# Patient Record
Sex: Female | Born: 1935
Health system: Southern US, Community
[De-identification: ages and names within clinical notes are randomized; demographics above are authoritative.]

## PROBLEM LIST (undated history)

## (undated) DIAGNOSIS — S42309A Unspecified fracture of shaft of humerus, unspecified arm, initial encounter for closed fracture: Secondary | ICD-10-CM

## (undated) DIAGNOSIS — J449 Chronic obstructive pulmonary disease, unspecified: Secondary | ICD-10-CM

## (undated) DIAGNOSIS — E871 Hypo-osmolality and hyponatremia: Secondary | ICD-10-CM

## (undated) DIAGNOSIS — M1990 Unspecified osteoarthritis, unspecified site: Secondary | ICD-10-CM

## (undated) DIAGNOSIS — Z7901 Long term (current) use of anticoagulants: Secondary | ICD-10-CM

## (undated) DIAGNOSIS — J9600 Acute respiratory failure, unspecified whether with hypoxia or hypercapnia: Secondary | ICD-10-CM

## (undated) DIAGNOSIS — I10 Essential (primary) hypertension: Secondary | ICD-10-CM

## (undated) DIAGNOSIS — I482 Chronic atrial fibrillation, unspecified: Secondary | ICD-10-CM

## (undated) DIAGNOSIS — I509 Heart failure, unspecified: Secondary | ICD-10-CM

## (undated) DIAGNOSIS — J40 Bronchitis, not specified as acute or chronic: Secondary | ICD-10-CM

## (undated) DIAGNOSIS — I2510 Atherosclerotic heart disease of native coronary artery without angina pectoris: Secondary | ICD-10-CM

## (undated) DIAGNOSIS — IMO0001 Reserved for inherently not codable concepts without codable children: Secondary | ICD-10-CM

## (undated) DIAGNOSIS — N289 Disorder of kidney and ureter, unspecified: Secondary | ICD-10-CM

## (undated) DIAGNOSIS — I5033 Acute on chronic diastolic (congestive) heart failure: Secondary | ICD-10-CM

## (undated) DIAGNOSIS — E079 Disorder of thyroid, unspecified: Secondary | ICD-10-CM

## (undated) DIAGNOSIS — F0390 Unspecified dementia without behavioral disturbance: Secondary | ICD-10-CM

## (undated) DIAGNOSIS — C801 Malignant (primary) neoplasm, unspecified: Secondary | ICD-10-CM

## (undated) DIAGNOSIS — Z950 Presence of cardiac pacemaker: Secondary | ICD-10-CM

## (undated) DIAGNOSIS — Z5189 Encounter for other specified aftercare: Secondary | ICD-10-CM

## (undated) DIAGNOSIS — I459 Conduction disorder, unspecified: Secondary | ICD-10-CM

## (undated) DIAGNOSIS — D649 Anemia, unspecified: Secondary | ICD-10-CM

## (undated) DIAGNOSIS — R413 Other amnesia: Secondary | ICD-10-CM

## (undated) DIAGNOSIS — I340 Nonrheumatic mitral (valve) insufficiency: Secondary | ICD-10-CM

## (undated) DIAGNOSIS — E876 Hypokalemia: Secondary | ICD-10-CM

## (undated) DIAGNOSIS — Z45018 Encounter for adjustment and management of other part of cardiac pacemaker: Secondary | ICD-10-CM

## (undated) HISTORY — DX: Unspecified osteoarthritis, unspecified site: M19.90

## (undated) HISTORY — PX: REPLACEMENT TOTAL KNEE BILATERAL: SUR1225

## (undated) HISTORY — DX: Acute respiratory failure, unspecified whether with hypoxia or hypercapnia: J96.00

## (undated) HISTORY — DX: Conduction disorder, unspecified: I45.9

## (undated) HISTORY — PX: WRIST FRACTURE SURGERY: SHX121

## (undated) HISTORY — DX: Malignant (primary) neoplasm, unspecified: C80.1

## (undated) HISTORY — PX: OTHER SURGICAL HISTORY: SHX169

## (undated) HISTORY — DX: Anemia, unspecified: D64.9

## (undated) HISTORY — PX: ABOVE KNEE LEG AMPUTATION: SUR20

## (undated) HISTORY — PX: FRACTURE SURGERY: SHX138

## (undated) HISTORY — DX: Essential (primary) hypertension: I10

## (undated) HISTORY — PX: BREAST BIOPSY: SHX20

## (undated) HISTORY — PX: FEMUR FRACTURE SURGERY: SHX633

## (undated) HISTORY — PX: JOINT REPLACEMENT: SHX530

## (undated) HISTORY — DX: Encounter for other specified aftercare: Z51.89

## (undated) HISTORY — PX: BELOW KNEE LEG AMPUTATION: SUR23

## (undated) HISTORY — DX: Acute on chronic diastolic (congestive) heart failure: I50.33

## (undated) HISTORY — DX: Hypokalemia: E87.6

## (undated) HISTORY — DX: Nonrheumatic mitral (valve) insufficiency: I34.0

## (undated) HISTORY — DX: Bronchitis, not specified as acute or chronic: J40

## (undated) HISTORY — DX: Reserved for inherently not codable concepts without codable children: IMO0001

## (undated) HISTORY — PX: PACEMAKER PLACEMENT: SHX43

## (undated) HISTORY — PX: ABDOMINAL HYSTERECTOMY: SHX81

## (undated) HISTORY — PX: SHOULDER SURGERY: SHX246

## (undated) HISTORY — DX: Hypo-osmolality and hyponatremia: E87.1

## (undated) HISTORY — PX: COLONOSCOPY: SHX174

---
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
## 1998-06-26 ENCOUNTER — Other Ambulatory Visit: Admission: RE | Admit: 1998-06-26 | Discharge: 1998-06-26 | Payer: Self-pay | Admitting: Internal Medicine

## 1999-04-12 ENCOUNTER — Encounter: Payer: Self-pay | Admitting: Orthopedic Surgery

## 1999-04-19 ENCOUNTER — Encounter: Payer: Self-pay | Admitting: Orthopedic Surgery

## 1999-04-19 ENCOUNTER — Inpatient Hospital Stay (HOSPITAL_COMMUNITY): Admission: RE | Admit: 1999-04-19 | Discharge: 1999-04-23 | Payer: Self-pay | Admitting: Orthopedic Surgery

## 2000-03-03 ENCOUNTER — Emergency Department (HOSPITAL_COMMUNITY): Admission: EM | Admit: 2000-03-03 | Discharge: 2000-03-03 | Payer: Self-pay | Admitting: Emergency Medicine

## 2000-03-03 ENCOUNTER — Encounter: Payer: Self-pay | Admitting: Emergency Medicine

## 2000-03-29 ENCOUNTER — Encounter: Payer: Self-pay | Admitting: Internal Medicine

## 2000-03-29 ENCOUNTER — Encounter: Admission: RE | Admit: 2000-03-29 | Discharge: 2000-03-29 | Payer: Self-pay | Admitting: Internal Medicine

## 2001-03-01 ENCOUNTER — Encounter: Payer: Self-pay | Admitting: Orthopedic Surgery

## 2001-03-01 ENCOUNTER — Encounter: Admission: RE | Admit: 2001-03-01 | Discharge: 2001-03-01 | Payer: Self-pay | Admitting: Orthopedic Surgery

## 2001-09-03 ENCOUNTER — Other Ambulatory Visit: Admission: RE | Admit: 2001-09-03 | Discharge: 2001-09-03 | Payer: Self-pay | Admitting: Internal Medicine

## 2001-09-07 ENCOUNTER — Encounter: Payer: Self-pay | Admitting: Internal Medicine

## 2001-09-07 ENCOUNTER — Encounter: Admission: RE | Admit: 2001-09-07 | Discharge: 2001-09-07 | Payer: Self-pay | Admitting: Internal Medicine

## 2001-10-22 ENCOUNTER — Encounter: Payer: Self-pay | Admitting: Orthopedic Surgery

## 2001-10-29 ENCOUNTER — Encounter: Payer: Self-pay | Admitting: Orthopedic Surgery

## 2001-10-29 ENCOUNTER — Inpatient Hospital Stay (HOSPITAL_COMMUNITY): Admission: RE | Admit: 2001-10-29 | Discharge: 2001-11-02 | Payer: Self-pay | Admitting: Orthopedic Surgery

## 2001-10-31 ENCOUNTER — Encounter: Payer: Self-pay | Admitting: Orthopedic Surgery

## 2002-10-17 ENCOUNTER — Encounter: Payer: Self-pay | Admitting: Internal Medicine

## 2002-10-17 ENCOUNTER — Encounter: Admission: RE | Admit: 2002-10-17 | Discharge: 2002-10-17 | Payer: Self-pay | Admitting: Internal Medicine

## 2003-11-03 ENCOUNTER — Ambulatory Visit (HOSPITAL_COMMUNITY): Admission: RE | Admit: 2003-11-03 | Discharge: 2003-11-03 | Payer: Self-pay | Admitting: *Deleted

## 2003-11-25 ENCOUNTER — Encounter: Admission: RE | Admit: 2003-11-25 | Discharge: 2003-11-25 | Payer: Self-pay | Admitting: Internal Medicine

## 2003-12-03 ENCOUNTER — Encounter: Admission: RE | Admit: 2003-12-03 | Discharge: 2003-12-03 | Payer: Self-pay | Admitting: Internal Medicine

## 2004-02-15 ENCOUNTER — Encounter: Admission: RE | Admit: 2004-02-15 | Discharge: 2004-02-15 | Payer: Self-pay | Admitting: Orthopedic Surgery

## 2004-06-15 ENCOUNTER — Ambulatory Visit: Admission: RE | Admit: 2004-06-15 | Discharge: 2004-06-15 | Payer: Self-pay | Admitting: Internal Medicine

## 2005-01-04 ENCOUNTER — Encounter: Admission: RE | Admit: 2005-01-04 | Discharge: 2005-01-04 | Payer: Self-pay | Admitting: Internal Medicine

## 2005-06-21 DIAGNOSIS — Z950 Presence of cardiac pacemaker: Secondary | ICD-10-CM | POA: Insufficient documentation

## 2005-06-21 HISTORY — DX: Presence of cardiac pacemaker: Z95.0

## 2005-12-05 ENCOUNTER — Ambulatory Visit: Admission: RE | Admit: 2005-12-05 | Discharge: 2005-12-05 | Payer: Self-pay | Admitting: Internal Medicine

## 2006-01-11 ENCOUNTER — Encounter: Admission: RE | Admit: 2006-01-11 | Discharge: 2006-01-11 | Payer: Self-pay | Admitting: Internal Medicine

## 2006-01-17 ENCOUNTER — Encounter: Admission: RE | Admit: 2006-01-17 | Discharge: 2006-01-17 | Payer: Self-pay | Admitting: Internal Medicine

## 2006-03-09 ENCOUNTER — Encounter: Admission: RE | Admit: 2006-03-09 | Discharge: 2006-03-09 | Payer: Self-pay | Admitting: Orthopedic Surgery

## 2006-05-04 ENCOUNTER — Encounter: Payer: Self-pay | Admitting: Internal Medicine

## 2006-09-18 ENCOUNTER — Encounter (INDEPENDENT_AMBULATORY_CARE_PROVIDER_SITE_OTHER): Payer: Self-pay | Admitting: *Deleted

## 2006-09-18 ENCOUNTER — Encounter: Admission: RE | Admit: 2006-09-18 | Discharge: 2006-09-18 | Payer: Self-pay | Admitting: Internal Medicine

## 2006-10-09 ENCOUNTER — Encounter: Admission: RE | Admit: 2006-10-09 | Discharge: 2006-10-09 | Payer: Self-pay | Admitting: Internal Medicine

## 2006-10-09 ENCOUNTER — Encounter (INDEPENDENT_AMBULATORY_CARE_PROVIDER_SITE_OTHER): Payer: Self-pay | Admitting: *Deleted

## 2007-01-15 ENCOUNTER — Encounter: Admission: RE | Admit: 2007-01-15 | Discharge: 2007-01-15 | Payer: Self-pay | Admitting: Internal Medicine

## 2007-03-28 ENCOUNTER — Emergency Department (HOSPITAL_COMMUNITY): Admission: EM | Admit: 2007-03-28 | Discharge: 2007-03-28 | Payer: Self-pay | Admitting: Emergency Medicine

## 2007-04-04 ENCOUNTER — Encounter: Payer: Self-pay | Admitting: Internal Medicine

## 2007-04-06 ENCOUNTER — Inpatient Hospital Stay (HOSPITAL_COMMUNITY): Admission: RE | Admit: 2007-04-06 | Discharge: 2007-04-16 | Payer: Self-pay | Admitting: Orthopedic Surgery

## 2007-06-04 ENCOUNTER — Inpatient Hospital Stay (HOSPITAL_COMMUNITY): Admission: RE | Admit: 2007-06-04 | Discharge: 2007-06-11 | Payer: Self-pay | Admitting: Orthopedic Surgery

## 2007-07-13 ENCOUNTER — Encounter (INDEPENDENT_AMBULATORY_CARE_PROVIDER_SITE_OTHER): Payer: Self-pay | Admitting: Orthopedic Surgery

## 2007-07-13 ENCOUNTER — Inpatient Hospital Stay (HOSPITAL_COMMUNITY): Admission: RE | Admit: 2007-07-13 | Discharge: 2007-07-17 | Payer: Self-pay | Admitting: Orthopedic Surgery

## 2007-10-30 ENCOUNTER — Encounter: Payer: Self-pay | Admitting: Internal Medicine

## 2008-01-12 ENCOUNTER — Encounter: Admission: RE | Admit: 2008-01-12 | Discharge: 2008-01-12 | Payer: Self-pay | Admitting: Orthopedic Surgery

## 2008-01-18 ENCOUNTER — Encounter: Admission: RE | Admit: 2008-01-18 | Discharge: 2008-01-18 | Payer: Self-pay | Admitting: Internal Medicine

## 2008-05-29 ENCOUNTER — Encounter: Admission: RE | Admit: 2008-05-29 | Discharge: 2008-05-29 | Payer: Self-pay | Admitting: Orthopedic Surgery

## 2008-07-10 ENCOUNTER — Inpatient Hospital Stay (HOSPITAL_COMMUNITY): Admission: RE | Admit: 2008-07-10 | Discharge: 2008-07-11 | Payer: Self-pay | Admitting: Orthopedic Surgery

## 2009-01-21 ENCOUNTER — Encounter: Admission: RE | Admit: 2009-01-21 | Discharge: 2009-01-21 | Payer: Self-pay | Admitting: Internal Medicine

## 2009-05-19 ENCOUNTER — Encounter: Payer: Self-pay | Admitting: Internal Medicine

## 2009-06-15 ENCOUNTER — Encounter: Admission: RE | Admit: 2009-06-15 | Discharge: 2009-09-13 | Payer: Self-pay | Admitting: Internal Medicine

## 2009-09-24 ENCOUNTER — Encounter: Admission: RE | Admit: 2009-09-24 | Discharge: 2009-09-24 | Payer: Self-pay | Admitting: Orthopedic Surgery

## 2009-12-21 ENCOUNTER — Encounter: Payer: Self-pay | Admitting: Internal Medicine

## 2010-02-10 ENCOUNTER — Encounter: Admission: RE | Admit: 2010-02-10 | Discharge: 2010-02-10 | Payer: Self-pay | Admitting: Internal Medicine

## 2010-03-08 ENCOUNTER — Encounter: Payer: Self-pay | Admitting: Internal Medicine

## 2010-04-07 ENCOUNTER — Encounter: Payer: Self-pay | Admitting: Internal Medicine

## 2010-04-12 ENCOUNTER — Encounter: Payer: Self-pay | Admitting: Internal Medicine

## 2010-04-15 ENCOUNTER — Encounter: Payer: Self-pay | Admitting: Internal Medicine

## 2010-05-03 ENCOUNTER — Encounter: Payer: Self-pay | Admitting: Internal Medicine

## 2010-06-17 ENCOUNTER — Telehealth (INDEPENDENT_AMBULATORY_CARE_PROVIDER_SITE_OTHER): Payer: Self-pay | Admitting: *Deleted

## 2010-06-17 DIAGNOSIS — J45909 Unspecified asthma, uncomplicated: Secondary | ICD-10-CM | POA: Insufficient documentation

## 2010-06-17 DIAGNOSIS — J40 Bronchitis, not specified as acute or chronic: Secondary | ICD-10-CM | POA: Insufficient documentation

## 2010-06-17 DIAGNOSIS — D649 Anemia, unspecified: Secondary | ICD-10-CM | POA: Insufficient documentation

## 2010-06-17 DIAGNOSIS — K649 Unspecified hemorrhoids: Secondary | ICD-10-CM | POA: Insufficient documentation

## 2010-06-17 DIAGNOSIS — I421 Obstructive hypertrophic cardiomyopathy: Secondary | ICD-10-CM | POA: Insufficient documentation

## 2010-06-17 DIAGNOSIS — R079 Chest pain, unspecified: Secondary | ICD-10-CM | POA: Insufficient documentation

## 2010-06-17 DIAGNOSIS — N3090 Cystitis, unspecified without hematuria: Secondary | ICD-10-CM | POA: Insufficient documentation

## 2010-06-17 DIAGNOSIS — I080 Rheumatic disorders of both mitral and aortic valves: Secondary | ICD-10-CM | POA: Insufficient documentation

## 2010-06-17 DIAGNOSIS — R32 Unspecified urinary incontinence: Secondary | ICD-10-CM | POA: Insufficient documentation

## 2010-06-17 DIAGNOSIS — Z8679 Personal history of other diseases of the circulatory system: Secondary | ICD-10-CM | POA: Insufficient documentation

## 2010-06-18 ENCOUNTER — Ambulatory Visit: Payer: Self-pay | Admitting: Internal Medicine

## 2010-06-21 DIAGNOSIS — Z950 Presence of cardiac pacemaker: Secondary | ICD-10-CM

## 2010-06-23 ENCOUNTER — Encounter: Payer: Self-pay | Admitting: Internal Medicine

## 2010-11-03 ENCOUNTER — Encounter: Admission: RE | Admit: 2010-11-03 | Discharge: 2010-11-03 | Payer: Self-pay | Admitting: Internal Medicine

## 2010-12-19 HISTORY — PX: BREAST LUMPECTOMY: SHX2

## 2010-12-22 ENCOUNTER — Ambulatory Visit
Admission: RE | Admit: 2010-12-22 | Discharge: 2010-12-22 | Payer: Self-pay | Source: Home / Self Care | Attending: Internal Medicine | Admitting: Internal Medicine

## 2010-12-22 ENCOUNTER — Encounter: Payer: Self-pay | Admitting: Internal Medicine

## 2010-12-22 DIAGNOSIS — R0989 Other specified symptoms and signs involving the circulatory and respiratory systems: Secondary | ICD-10-CM | POA: Insufficient documentation

## 2010-12-24 ENCOUNTER — Encounter: Payer: Self-pay | Admitting: Internal Medicine

## 2010-12-24 ENCOUNTER — Ambulatory Visit: Admission: RE | Admit: 2010-12-24 | Discharge: 2010-12-24 | Payer: Self-pay | Source: Home / Self Care

## 2011-01-08 ENCOUNTER — Encounter: Payer: Self-pay | Admitting: Internal Medicine

## 2011-01-08 ENCOUNTER — Other Ambulatory Visit: Payer: Self-pay | Admitting: Internal Medicine

## 2011-01-08 DIAGNOSIS — Z1239 Encounter for other screening for malignant neoplasm of breast: Secondary | ICD-10-CM

## 2011-01-09 ENCOUNTER — Encounter: Payer: Self-pay | Admitting: Internal Medicine

## 2011-01-18 NOTE — Miscellaneous (Signed)
Summary: Device preload  Clinical Lists Changes  Observations: Added new observation of PPM INDICATN: CHB (06/23/2010 15:28) Added new observation of MAGNET RTE: BOL 85 ERI 65 (06/23/2010 15:28) Added new observation of PPMLEADSTAT2: active (06/23/2010 15:28) Added new observation of PPMLEADSER2: [id number] (06/23/2010 15:28) Added new observation of PPMLEADMOD2: 5076  (06/23/2010 15:28) Added new observation of PPMLEADDOI2: 08/05/2005  (06/23/2010 15:28) Added new observation of PPMLEADLOC2: RV  (06/23/2010 15:28) Added new observation of PPMLEADSTAT1: active  (06/23/2010 15:28) Added new observation of PPMLEADSER1: [id number]  (06/23/2010 15:28) Added new observation of PPMLEADMOD1: 5076  (06/23/2010 15:28) Added new observation of PPMLEADDOI1: 08/05/2005  (06/23/2010 15:28) Added new observation of PPMLEADLOC1: RA  (06/23/2010 15:28) Added new observation of PPM IMP MD: NOT IMPLANTED BY [place]  (06/23/2010 15:28) Added new observation of PPM DOI: 08/05/2005  (06/23/2010 15:28) Added new observation of PPM SERL#: [id number]  (06/23/2010 15:28) Added new observation of PPM MODL#: [id number]  (06/23/2010 40:10) Added new observation of PACEMAKERMFG: Medtronic  (06/23/2010 15:28) Added new observation of PACEMAKER MD: Lewayne Bunting, MD  (06/23/2010 15:28)      PPM Specifications Following MD:  Lewayne Bunting, MD     PPM Vendor:  Medtronic     PPM Model Number:  [id number]     PPM Serial Number:  [id number] PPM DOI:  08/05/2005     PPM Implanting MD:  NOT IMPLANTED BY [place]  Lead 1    Location: RA     DOI: 08/05/2005     Model #: [id number]     Serial #: [id number]     Status: active Lead 2    Location: RV     DOI: 08/05/2005     Model #: [id number]     Serial #: [id number]     Status: active  Magnet Response Rate:  BOL 85 ERI 65  Indications:  CHB

## 2011-01-18 NOTE — Letter (Signed)
Summary: [facility] Office Note   [facility] Office Note   Imported By: Roderic Ovens 07/29/2010 11:46:32  _____________________________________________________________________  External Attachment:    Type:   Image     Comment:   External Document

## 2011-01-18 NOTE — Progress Notes (Signed)
Summary: [facility] Office Note   [facility] Office Note   Imported By: Roderic Ovens 07/27/2010 14:42:19  _____________________________________________________________________  External Attachment:    Type:   Image     Comment:   External Document

## 2011-01-18 NOTE — Assessment & Plan Note (Signed)
Summary: ep/pt has device/jss   Visit Type:  Follow-up Primary Provider:  [name], MD   History of Present Illness: Ms. Negrette is referred today by Dr. Selena Batten to assume cardiology care for this 77 yo woman with a h/o HCM, s/p septal myomectomy, CHB, s/p PPM.  She has done well from a cardiology perspective since her surgery.  She denies a h/o c/p or sob or syncope.  Her biggest problem has been arthritic with multiple joint replacements.  She has a h/o HTN.  She has mild peripheral edema.  She states that she has remained active walking and swimming.    Current Medications (verified): 1)  Ultram Er 300 Mg Xr24h-Tab (Tramadol Hcl) .... 2 Tablets Three Times A Day 2)  Meloxicam 15 Mg Tabs (Meloxicam) .... Once Daily 3)  Metoprolol Succinate 100 Mg Xr24h-Tab (Metoprolol Succinate) .... Take One Tablet By Mouth Daily 4)  Aspirin 81 Mg Tbec (Aspirin) .... Take One Tablet By Mouth Daily 5)  Vitamin D3 2000 Unit Caps (Cholecalciferol) .... Once Daily 6)  Multivitamins   Tabs (Multiple Vitamin) .... Once Daily 7)  Proair Hfa 108 (90 Base) Mcg/act Aers (Albuterol Sulfate) .... Uad 8)  Fish Oil   Oil (Fish Oil) .... 1000mg  Once Daily  Allergies (verified): 1)  ! Codeine 2)  ! Morphine 3)  ! Sulfa  Past History:  Past Medical History: Last updated: 06/17/2010 Chest Pain HYPERTENSION  MITRAL REGURGITATION  HEART BLOCK Hypertrophic obstructive cardiomyopathy, required septal myotomy. URINARY INCONTINENCE  CYSTITIS HEMORRHOIDS BRONCHITIS ASTHMA  ANEMIA  Complete heart block post surgical requiring pacer.  Resection arthroplasty right knee secondary to infection status  post right total knee arthroplasty reimplantation. Postoperative hypokalemia, improved.  Mild postoperative hyponatremia, improved.    Past Surgical History: Last updated: 06/17/2010 Anterior cervical diskectomy and fusion C6-7.  Septal myotomy.  Pacemaker placement.  Bilateral total knee replacement arthroplasties.    Right femur fracture surgery.  Right wrist fracture surgery.  Right shoulder surgery.  Left breast biopsy, which was negative.  Colonoscopy.  Hysterectomy.   Family History: Last updated: 06/17/2010  Mother with a history of heart disease, hypertension.   Both parents with a history of pancreatic cancer.  Also an aunt with   pancreatic cancer.  Brother with diabetes.  Another brother with a   history of enlarged heart.   Social History: Last updated: 06/17/2010  Married, nonsmoker.  No alcohol.  Two children.   Husband will be assisting with care after surgery.      Review of Systems       All systems reviewed and negative except as noted in the HPI.  Vital Signs:  Patient profile:   77 year old female Height:      60 inches Weight:      152 pounds BMI:     29.79 Pulse rate:   89 / minute BP sitting:   112 / 64  (left arm)  Vitals Entered By: Laurance Flatten CMA (June 18, 2010 3:28 PM)  Physical Exam  General:  Well developed, well nourished, in no acute distress.  HEENT: normal Neck: supple. No JVD. Carotids 2+ bilaterally no bruits Cor: RRR no rubs, gallops or murmur Lungs: CTA. Well healed PPM incision. Ab: soft, nontender. nondistended. No HSM. Good bowel sounds Ext: warm. no cyanosis or clubbing. Trace peripheral edema. Neuro: alert and oriented. Grossly nonfocal. affect pleasant    MD Comments:  Normal device function. Longevity 4 yrs.  Impression & Recommendations:  Problem # 1:  PACEMAKER, PERMANENT (ICD-V45.01) Her device is working normally.  Will recheck in several months.  Problem # 2:  HYPERTENSION (ICD-401.9) Her blood pressure has been fairly well controlled.  A low sodium diet is recommended.  She will continue her current meds. Her updated medication list for this problem includes:    Metoprolol Succinate 100 Mg Xr24h-tab (Metoprolol succinate) .Marland Kitchen... Take one tablet by mouth daily    Aspirin 81 Mg Tbec (Aspirin) .Marland Kitchen... Take one tablet by mouth  daily  Problem # 3:  HYPERTROPHIC OBSTRUCTIVE CARDIOMYOPATHY (ICD-425.1) She remains symptom free from her HCM.  Will follow. Her updated medication list for this problem includes:    Metoprolol Succinate 100 Mg Xr24h-tab (Metoprolol succinate) .Marland Kitchen... Take one tablet by mouth daily    Aspirin 81 Mg Tbec (Aspirin) .Marland Kitchen... Take one tablet by mouth daily  Patient Instructions: 1)  Your physician recommends that you schedule a follow-up appointment in: 6 MONTHS

## 2011-01-18 NOTE — Progress Notes (Signed)
Summary: [facility] Office Visit Note   [facility] Office Visit Note   Imported By: Roderic Ovens 07/29/2010 10:37:11  _____________________________________________________________________  External Attachment:    Type:   Image     Comment:   External Document

## 2011-01-18 NOTE — Progress Notes (Signed)
  Records Recieved from [facility], put on gesila's Desk.for appt 06/18/2010 [facility]  June 17, 2010 1:55 PM

## 2011-01-18 NOTE — Letter (Signed)
Summary: [facility] 24 Hour Holter Report   [facility] 24 Hour Holter Report   Imported By: Roderic Ovens 07/29/2010 10:54:56  _____________________________________________________________________  External Attachment:    Type:   Image     Comment:   External Document

## 2011-01-20 NOTE — Assessment & Plan Note (Signed)
Summary: F6M/DM   Visit Type:  follow-up Primary Provider:  [name], MD   History of Present Illness: [name] returns today for cardiology follow-up. She is 77 yo woman with a h/o HCM, s/p septal myomectomy, CHB, s/p PPM.  She has done well from a cardiology perspective since her surgery.  She denies a h/o c/p or sob or syncope.  Her biggest problem has been arthritic with multiple joint replacements.  She has a h/o HTN.  She has mild peripheral edema.  She states that she has remained active walking and swimming but does at times experience dyspnea.  She admits to sodium indiscretion.  Current Medications (verified): 1)  Ultram Er 300 Mg Xr24h-Tab (Tramadol Hcl) .... 2 Tablets Three Times A Day 2)  Meloxicam 15 Mg Tabs (Meloxicam) .... Once Daily 3)  Metoprolol Succinate 100 Mg Xr24h-Tab (Metoprolol Succinate) .... Take One Tablet By Mouth Daily 4)  Aspirin 81 Mg Tbec (Aspirin) .... Take One Tablet By Mouth Daily 5)  Vitamin D3 2000 Unit Caps (Cholecalciferol) .... Once Daily 6)  Multivitamins   Tabs (Multiple Vitamin) .... Once Daily 7)  Proair Hfa 108 (90 Base) Mcg/act Aers (Albuterol Sulfate) .... Uad 8)  Fish Oil   Oil (Fish Oil) .... 1200mg  Once Daily  Allergies: 1)  ! Codeine 2)  ! Morphine 3)  ! Sulfa  Past History:  Past Medical History: Last updated: 06/17/2010 Chest Pain HYPERTENSION  MITRAL REGURGITATION  HEART BLOCK Hypertrophic obstructive cardiomyopathy, required septal myotomy. URINARY INCONTINENCE  CYSTITIS HEMORRHOIDS BRONCHITIS ASTHMA  ANEMIA  Complete heart block post surgical requiring pacer.  Resection arthroplasty right knee secondary to infection status  post right total knee arthroplasty reimplantation. Postoperative hypokalemia, improved.  Mild postoperative hyponatremia, improved.    Past Surgical History: Last updated: 06/17/2010 Anterior cervical diskectomy and fusion C6-7.  Septal myotomy.  Pacemaker placement.  Bilateral total knee  replacement arthroplasties.  Right femur fracture surgery.  Right wrist fracture surgery.  Right shoulder surgery.  Left breast biopsy, which was negative.  Colonoscopy.  Hysterectomy.   Review of Systems       The patient complains of dyspnea on exertion and peripheral edema.  The patient denies chest pain and syncope.    Vital Signs:  Patient profile:   77 year old female Height:      60 inches Weight:      157 pounds BMI:     30.77 Pulse rate:   71 / minute BP sitting:   141 / 84  (left arm)  Vitals Entered By: Laurance Flatten CMA (December 22, 2010 2:57 PM)  Physical Exam  General:  Well developed, well nourished, in no acute distress.  HEENT: normal Neck: supple. No JVD. Carotids 2+ bilaterally no bruits Cor: RRR no rubs, gallops or murmur Lungs: CTA. Well healed PPM incision. Ab: soft, nontender. nondistended. No HSM. Good bowel sounds Ext: warm. no cyanosis or clubbing. Trace peripheral edema. Neuro: alert and oriented. Grossly nonfocal. affect pleasant    PPM Specifications Following MD:  Lewayne Bunting, MD     PPM Vendor:  [employer]tronic     PPM Model Number:  [id number]     PPM Serial Number:  [id number] PPM DOI:  08/05/2005     PPM Implanting MD:  NOT IMPLANTED BY [place]  Lead 1    Location: RA     DOI: 08/05/2005     Model #: [id number]     Serial #: [id number]     Status: active Lead 2  Location: RV     DOI: 08/05/2005     Model #: [id number]     Serial #: [id number]     Status: active  Magnet Response Rate:  BOL 85 ERI 65  Indications:  CHB   PPM Follow Up Battery Voltage:  2.76 V     Battery Est. Longevity:  3 yrs       PPM Device Measurements Atrium  Amplitude: 5.60 mV, Impedance: 446 ohms, Threshold: 0.50 V at 0.40 msec Right Ventricle  Amplitude: PACED mV, Impedance: 524 ohms, Threshold: 0.750 V at 0.40 msec  Episodes MS Episodes:  3     Percent Mode Switch:  <0.1%     Ventricular High Rate:  0     Atrial Pacing:  86.8%     Ventricular Pacing:  99.9%  Parameters Mode:   DDDR     Lower Rate Limit:  60     Upper Rate Limit:  120 Paced AV Delay:  150     Sensed AV Delay:  120 Next Remote Date:  03/24/2011     Next Cardiology Appt Due:  12/20/2011 Tech Comments:  3 MODE SWITCHES---ALL LESS THAN 1 MINUTE.  NORMAL DEVICE FUNCTION. CHANGED RA OUTPUT FROM 1.50 TO 2.00 V.  CARELINK TRANSMISSION 03-24-11 AND ROV IN 12 MTHS W/GT. Vella Kohler  December 22, 2010 3:09 PM MD Comments:  Agree with above.  Impression & Recommendations:  Problem # 1:  PACEMAKER, PERMANENT (ICD-V45.01) Her device is working normally.  Will followup in several months.  Problem # 2:  CAROTID BRUIT, RIGHT, LOCAL (ICD-785.9) Will check U/S. Orders: Carotid Duplex (Carotid Duplex)  Problem # 3:  HYPERTENSION (ICD-401.9) Her blood pressure has been a bit elevated.  I have asked her to redue her sodium intake. She may ultimately require a diuretic. Her updated medication list for this problem includes:    Metoprolol Succinate 100 Mg Xr24h-tab (Metoprolol succinate) .Marland Kitchen... Take one tablet by mouth daily    Aspirin 81 Mg Tbec (Aspirin) .Marland Kitchen... Take one tablet by mouth daily  Patient Instructions: 1)  Your physician wants you to follow-up in: 12 months with Dr Court Joy will receive a reminder letter in the mail two months in advance. If you don't receive a letter, please call our office to schedule the follow-up appointment. 2)  Carelinl Transmission 03/24/2011 3)  Your physician has requested that you have a carotid duplex. This test is an ultrasound of the carotid arteries in your neck. It looks at blood flow through these arteries that supply the brain with blood. Allow one hour for this exam. There are no restrictions or special instructions.

## 2011-01-20 NOTE — Cardiovascular Report (Signed)
Summary: Office Visit   Office Visit   Imported By: Roderic Ovens 12/24/2010 12:36:13  _____________________________________________________________________  External Attachment:    Type:   Image     Comment:   External Document

## 2011-02-11 ENCOUNTER — Ambulatory Visit
Admission: RE | Admit: 2011-02-11 | Discharge: 2011-02-11 | Disposition: A | Discharge status: Home / Self Care | Payer: Medicare Other | Source: Ambulatory Visit | Attending: Internal Medicine | Admitting: Internal Medicine

## 2011-02-11 DIAGNOSIS — Z1239 Encounter for other screening for malignant neoplasm of breast: Secondary | ICD-10-CM

## 2011-02-16 ENCOUNTER — Other Ambulatory Visit: Payer: Self-pay | Admitting: Internal Medicine

## 2011-02-16 DIAGNOSIS — R928 Other abnormal and inconclusive findings on diagnostic imaging of breast: Secondary | ICD-10-CM

## 2011-02-22 ENCOUNTER — Ambulatory Visit
Admission: RE | Admit: 2011-02-22 | Discharge: 2011-02-22 | Disposition: A | Discharge status: Home / Self Care | Payer: Medicare Other | Source: Ambulatory Visit | Attending: Internal Medicine | Admitting: Internal Medicine

## 2011-02-22 ENCOUNTER — Other Ambulatory Visit: Payer: Self-pay | Admitting: Internal Medicine

## 2011-02-22 ENCOUNTER — Other Ambulatory Visit: Payer: Self-pay | Admitting: Diagnostic Radiology

## 2011-02-22 DIAGNOSIS — R928 Other abnormal and inconclusive findings on diagnostic imaging of breast: Secondary | ICD-10-CM

## 2011-02-23 ENCOUNTER — Other Ambulatory Visit: Payer: Self-pay | Admitting: Internal Medicine

## 2011-02-23 DIAGNOSIS — C50912 Malignant neoplasm of unspecified site of left female breast: Secondary | ICD-10-CM

## 2011-02-25 ENCOUNTER — Other Ambulatory Visit: Payer: Medicare Other

## 2011-03-11 ENCOUNTER — Other Ambulatory Visit: Payer: Self-pay | Admitting: General Surgery

## 2011-03-11 DIAGNOSIS — C50912 Malignant neoplasm of unspecified site of left female breast: Secondary | ICD-10-CM

## 2011-03-14 ENCOUNTER — Telehealth: Payer: Self-pay | Admitting: Internal Medicine

## 2011-03-14 NOTE — Telephone Encounter (Signed)
Doppler faxed to [facility]/Dr. Dwain Sarna @ [phone] 03/14/11/KM

## 2011-03-24 ENCOUNTER — Ambulatory Visit (INDEPENDENT_AMBULATORY_CARE_PROVIDER_SITE_OTHER): Payer: Medicare Other | Admitting: *Deleted

## 2011-03-24 DIAGNOSIS — I442 Atrioventricular block, complete: Secondary | ICD-10-CM

## 2011-03-24 DIAGNOSIS — R0989 Other specified symptoms and signs involving the circulatory and respiratory systems: Secondary | ICD-10-CM

## 2011-03-24 DIAGNOSIS — Z950 Presence of cardiac pacemaker: Secondary | ICD-10-CM

## 2011-03-25 ENCOUNTER — Other Ambulatory Visit: Payer: Self-pay | Admitting: Internal Medicine

## 2011-03-29 ENCOUNTER — Other Ambulatory Visit: Payer: Self-pay | Admitting: Surgery

## 2011-03-29 ENCOUNTER — Ambulatory Visit (HOSPITAL_COMMUNITY)
Admission: RE | Admit: 2011-03-29 | Discharge: 2011-03-29 | Disposition: A | Discharge status: Home / Self Care | Payer: Medicare Other | Source: Ambulatory Visit | Attending: Surgery | Admitting: Surgery

## 2011-03-29 ENCOUNTER — Other Ambulatory Visit (HOSPITAL_COMMUNITY): Payer: Self-pay | Admitting: Surgery

## 2011-03-29 ENCOUNTER — Encounter (HOSPITAL_COMMUNITY)
Admission: RE | Admit: 2011-03-29 | Discharge: 2011-03-29 | Disposition: A | Discharge status: Home / Self Care | Payer: Medicare Other | Source: Ambulatory Visit | Attending: General Surgery | Admitting: General Surgery

## 2011-03-29 ENCOUNTER — Telehealth: Payer: Self-pay | Admitting: Internal Medicine

## 2011-03-29 DIAGNOSIS — J40 Bronchitis, not specified as acute or chronic: Secondary | ICD-10-CM | POA: Insufficient documentation

## 2011-03-29 DIAGNOSIS — C50919 Malignant neoplasm of unspecified site of unspecified female breast: Secondary | ICD-10-CM | POA: Insufficient documentation

## 2011-03-29 DIAGNOSIS — C50912 Malignant neoplasm of unspecified site of left female breast: Secondary | ICD-10-CM

## 2011-03-29 DIAGNOSIS — Z01818 Encounter for other preprocedural examination: Secondary | ICD-10-CM | POA: Insufficient documentation

## 2011-03-29 DIAGNOSIS — I10 Essential (primary) hypertension: Secondary | ICD-10-CM | POA: Insufficient documentation

## 2011-03-29 DIAGNOSIS — Z950 Presence of cardiac pacemaker: Secondary | ICD-10-CM | POA: Insufficient documentation

## 2011-03-29 DIAGNOSIS — Z01810 Encounter for preprocedural cardiovascular examination: Secondary | ICD-10-CM | POA: Insufficient documentation

## 2011-03-29 LAB — CBC
HCT: 39.5 % (ref 36.0–46.0)
Hemoglobin: 13 g/dL (ref 12.0–15.0)
MCH: 31.9 pg (ref 26.0–34.0)
MCHC: 32.9 g/dL (ref 30.0–36.0)
MCV: 96.8 fL (ref 78.0–100.0)
Platelets: 209 10*3/uL (ref 150–400)
RBC: 4.08 MIL/uL (ref 3.87–5.11)
RDW: 13 % (ref 11.5–15.5)
WBC: 7 10*3/uL (ref 4.0–10.5)

## 2011-03-29 LAB — COMPREHENSIVE METABOLIC PANEL
ALT: 18 U/L (ref 0–35)
AST: 29 U/L (ref 0–37)
Albumin: 3.8 g/dL (ref 3.5–5.2)
Alkaline Phosphatase: 64 U/L (ref 39–117)
BUN: 18 mg/dL (ref 6–23)
CO2: 30 mEq/L (ref 19–32)
Calcium: 9.7 mg/dL (ref 8.4–10.5)
Chloride: 103 mEq/L (ref 96–112)
Creatinine, Ser: 1.16 mg/dL (ref 0.4–1.2)
GFR calc Af Amer: 55 mL/min — ABNORMAL LOW (ref >60)
GFR calc non Af Amer: 45 mL/min — ABNORMAL LOW (ref >60)
Glucose, Bld: 84 mg/dL (ref 70–99)
Potassium: 4.9 mEq/L (ref 3.5–5.1)
Sodium: 139 mEq/L (ref 135–145)
Total Bilirubin: 0.7 mg/dL (ref 0.3–1.2)
Total Protein: 6.4 g/dL (ref 6.0–8.3)

## 2011-03-29 LAB — DIFFERENTIAL
Basophils Absolute: 0 10*3/uL (ref 0.0–0.1)
Basophils Relative: 0 % (ref 0–1)
Eosinophils Absolute: 0.2 10*3/uL (ref 0.0–0.7)
Eosinophils Relative: 3 % (ref 0–5)
Lymphocytes Relative: 26 % (ref 12–46)
Lymphs Abs: 1.8 10*3/uL (ref 0.7–4.0)
Monocytes Absolute: 0.7 10*3/uL (ref 0.1–1.0)
Monocytes Relative: 10 % (ref 3–12)
Neutro Abs: 4.3 10*3/uL (ref 1.7–7.7)
Neutrophils Relative %: 61 % (ref 43–77)

## 2011-03-29 LAB — SURGICAL PCR SCREEN
MRSA, PCR: NEGATIVE
Staphylococcus aureus: NEGATIVE

## 2011-03-29 LAB — CANCER ANTIGEN 27.29: CA 27.29: 32 U/mL (ref 0–39)

## 2011-03-29 NOTE — Telephone Encounter (Signed)
Faxed Carotid, Echo & OV to [facility] ([phone]).

## 2011-03-29 NOTE — Progress Notes (Signed)
Pacer remote check  

## 2011-04-05 ENCOUNTER — Ambulatory Visit
Admission: RE | Admit: 2011-04-05 | Discharge: 2011-04-05 | Disposition: A | Discharge status: Home / Self Care | Payer: Medicare Other | Source: Ambulatory Visit | Attending: General Surgery | Admitting: General Surgery

## 2011-04-05 ENCOUNTER — Observation Stay (HOSPITAL_COMMUNITY)
Admission: RE | Admit: 2011-04-05 | Discharge: 2011-04-06 | Disposition: A | Discharge status: Home / Self Care | Payer: Medicare Other | Source: Ambulatory Visit | Attending: General Surgery | Admitting: General Surgery

## 2011-04-05 ENCOUNTER — Other Ambulatory Visit: Payer: Self-pay | Admitting: General Surgery

## 2011-04-05 DIAGNOSIS — C50912 Malignant neoplasm of unspecified site of left female breast: Secondary | ICD-10-CM

## 2011-04-05 DIAGNOSIS — K5730 Diverticulosis of large intestine without perforation or abscess without bleeding: Secondary | ICD-10-CM | POA: Insufficient documentation

## 2011-04-05 DIAGNOSIS — J45909 Unspecified asthma, uncomplicated: Secondary | ICD-10-CM | POA: Insufficient documentation

## 2011-04-05 DIAGNOSIS — M810 Age-related osteoporosis without current pathological fracture: Secondary | ICD-10-CM | POA: Insufficient documentation

## 2011-04-05 DIAGNOSIS — D0590 Unspecified type of carcinoma in situ of unspecified breast: Principal | ICD-10-CM | POA: Insufficient documentation

## 2011-04-05 DIAGNOSIS — I10 Essential (primary) hypertension: Secondary | ICD-10-CM | POA: Insufficient documentation

## 2011-04-08 NOTE — Op Note (Signed)
  [name], HACK                 ACCOUNT NO.:  [id number]  MEDICAL RECORD NO.:  [id number]           PATIENT TYPE:  LOCATION:                                 FACILITY:  PHYSICIAN:  Juanetta Gosling, MDDATE OF BIRTH:  Dec 12, 1931  DATE OF PROCEDURE: DATE OF DISCHARGE:                              OPERATIVE REPORT   PREOPERATIVE DIAGNOSIS:  Left breast ductal carcinoma in situ.  POSTOPERATIVE DIAGNOSIS:  Left breast ductal carcinoma in situ.  PROCEDURE:  Left breast wire-guided lumpectomy.  SURGEON:  Juanetta Gosling, MD.  ASSISTANT:  None.  ANESTHESIA:  General.  SUPERVISING ANESTHESIOLOGIST:  Zenon Mayo, MD.  SPECIMENS:  Left breast tissue marked with paint kit, sent the specimen to pathology.  ESTIMATED BLOOD LOSS:  Minimal.  COMPLICATIONS:  None.  DRAINS:  None.  DISPOSITION:  To recovery room in stable condition.  INDICATIONS:  This is a 77 year old female, underwent a screening mammogram, with some microcalcifications noted on her left breast.  This was confirmed by magnification views.  Biopsy was performed and tissue marker clip was placed and showing high-grade DCIS with calcifications, ER/PR positive.  We discussed all of her different options and elected to do a wire-guided lumpectomy.  DESCRIPTION OF PROCEDURE:  After informed consent was obtained, the patient was taken to the operating room.  She was at first had a wire placed by Dr. Britta Mccreedy.  I had the mammograms available for my review.  She was then administered 1 gram of intravenous cefazolin. Sequential compression devices were placed.  She was then placed under general anesthesia without complication.  Her left breast was prepped and draped in standard sterile surgical fashion.  A surgical time-out was then performed.  I then infiltrated the outer lower quadrant of the left breast.  I then made a radial incision overlying the wire tract and used cautery to excise the  breast tissue as well as the wire.  This did not include the pectoralis fascia.  This was a small area and I did not feel the need to go down that deep for this.  This was then passed off the table as specimen after being marked with a paint kit.  Flexitron mammogram confirmed removal of the clip in the middle of the specimen.  This was confirmed by Radiology.  I then obtained hemostasis.  I closed the breast tissue with a 2-0 Vicryl, the dermis with a 3-0 Vicryl, the skin with a 4-0 Monocryl.  Steri-Strips and sterile dressing were placed.  I then put a breast binder on her.  She was extubated in the operating room and transferred to the recovery room in stable condition.     Juanetta Gosling, MD     MCW/MEDQ  D:  04/05/2011  T:  04/06/2011  Job:  [id number]  cc:   Daryl Eastern, M.D. Doylene Canning. Ladona Ridgel, MD Massie Maroon, MD  Electronically Signed by Emelia Loron MD on 04/08/2011 08:55:39 AM

## 2011-04-17 ENCOUNTER — Encounter: Payer: Self-pay | Admitting: *Deleted

## 2011-05-03 ENCOUNTER — Encounter (HOSPITAL_BASED_OUTPATIENT_CLINIC_OR_DEPARTMENT_OTHER): Payer: Medicare Other | Admitting: Oncology

## 2011-05-03 DIAGNOSIS — D0590 Unspecified type of carcinoma in situ of unspecified breast: Secondary | ICD-10-CM

## 2011-05-03 NOTE — Op Note (Signed)
[name], [name]                 ACCOUNT NO.:  [id number]   MEDICAL RECORD NO.:  [id number]          PATIENT TYPE:  INP   LOCATION:  1620                         FACILITY:  [facility]   PHYSICIAN:  Ollen Gross, M.D.    DATE OF BIRTH:  1931-03-16   DATE OF PROCEDURE:  06/04/2007  DATE OF DISCHARGE:                               OPERATIVE REPORT   PREOPERATIVE DIAGNOSIS:  Infected right total knee arthroplasty.   POSTOPERATIVE DIAGNOSIS:  Infected right total knee arthroplasty.   PROCEDURE:  Right knee resection arthroplasty with placement of  antibiotic spacer.   SURGEON:  Ollen Gross, M.D.   ASSISTANT:  Avel Peace PA-C   ANESTHESIA:  General.   BLOOD LOSS:  Minimal.   DRAINS:  Hemovac x1.   TOURNIQUET TIME:  27 minutes 300 mmHg.   COMPLICATIONS:  None.   CONDITION:  Stable to recovery.   BRIEF CLINICAL NOTE:  Ms. [name] is a 77 year old female with long  complicated history regards to her right knee.  She is an infected total  knee arthroplasty.  She has grown Enterococcus from the knee.  She had  an incision and drainage and postop antibiotics but unfortunately the  swelling and septic knee and returned.  She presents now for section  arthroplasty.   PROCEDURE IN DETAIL:  After the successful initiation of general  anesthetic, tourniquet placed high on the right thigh.  Right lower  extremity prepped and draped in the usual sterile fashion.  Extremity is  elevated, tourniquet inflated to 300 mmHg.  Midline incision made with  10 blade through subcutaneous tissue to the extensor mechanism.  Fresh  blade is used to make a medial arthrotomy.  She has no patella.  There  is purulent fluid present sent for Gram stain culture and sensitivity.  We then did a quadriceps and flexed the knee, everted the patella and  removed the tibial polyethylene.  The femoral component was grossly  loose.  I was able to remove it with the fingers.  A tremendous amount  of fibrinous  debris present around the femur.  I debrided all that to  get back to normal looking bone.  There is tremendous amount of bone  loss from her previous revision.  We then thoroughly curetted the  femoral canal to remove any abnormal-appearing tissue.  There were two  previous cables around the femoral metaphysis and metaphyseal,  diaphyseal junction.  These are removed.  On the tibial side I disrupted  the interface between prosthesis and bone with osteotomes and easily  removed the tibial component.  There was allograft present medially and  that remained adhered to the component.  I wanted that removed anyway  since it never healed to the native bone and would serve as a nidus for  infection.  I then thoroughly debrided the canals of the femur and tibia  by an reaming up to 15 mm on the tibia and 21 mm of the femur.  We  thoroughly irrigated with 3 liters of saline solution with pulsatile  lavage.  The cement was mixed with a spacer,  three bags of cement with 3  grams vancomycin.  Once the cement was mixed and we molded the spacer  for a static spacer with no movement.  Once cement started to hardened  then we let the tourniquet down and stopped all minor bleeding cautery.  The spacer is then placed as it was hardening and the wound further  irrigated.  We then closed the arthrotomy quadriceps snip with  interrupted PDS, subcu with interrupted 2-0 Vicryl and skin with  staples.  The drain is hooked to suction.  Incision cleaned and dried  and a bulky sterile dressing applied.  She is placed into a knee  immobilizer, awakened and transferred to recovery in stable condition.      Ollen Gross, M.D.  Electronically Signed     FA/MEDQ  D:  06/04/2007  T:  06/05/2007  Job:  [id number]

## 2011-05-03 NOTE — Op Note (Signed)
[name], [name]                 ACCOUNT NO.:  [id number]   MEDICAL RECORD NO.:  [id number]          PATIENT TYPE:  OIB   LOCATION:  3536                         FACILITY:  [facility]   PHYSICIAN:  Alvy Beal, MD    DATE OF BIRTH:  23-Nov-1931   DATE OF PROCEDURE:  DATE OF DISCHARGE:                               OPERATIVE REPORT   PREOPERATIVE DIAGNOSES:  Degenerative cervical disk disease, C6-7 with  right C7 radiculopathy and weakness.   POSTOPERATIVE DIAGNOSES:  Degenerative cervical disk disease, C6-7 with  right C7 radiculopathy and weakness.   OPERATIVE PROCEDURE:  Anterior cervical diskectomy and fusion C6-7.   SURGEON:  Alvy Beal, MD   FIRST ASSISTANT:  Crissie Reese, PA   INSTRUMENTATION USED:  Size 6 mm lordotic fibular allograft spacer  packed with Actifuse with a 14 mm anterior cervical plate contoured.  This is [employer] with 14 mm variable angle screws.   COMPLICATIONS:  None.   CONDITION:  Stable.   HISTORY:  This is a very pleasant elderly 77 year old woman who is been  having horrific neck and right arm pain.  She states that the neck pain  is longstanding and she has been able to deal with it and it was just  the onset of the right arm pain has made things intolerable.  Clinically, she had C7 radicular pain and significant right-sided  osteophyte hard disk herniation causing nerve compression.  Even though  she had degenerative disease at other levels, the only clinically  symptomatic level was C6-7.  After discussing treatment options, she  elected to proceed with just the one level ACDF.  All appropriate risks,  benefits, and alternatives were discussed with the patient and consent  was obtained.   OPERATIVE NOTE:  The patient was brought to the operating room and  placed supine on the operating table.  After successful induction of  general anesthesia and endotracheal intubation, TED and SCDs were  applied.  She was placed supine on  the operative table, and the all bony  processes were well padded.  Rolled towels were placed behind the head.  The shoulders were taped down and the neck was prepped and draped in a  standard fashion.   A horizontal incision was made just below the thyroid cartilage and  sharp dissection was carried out down to and through the platysma.  I  then sharply dissected through the deep cervical fascia and was able to  bluntly dissect to the anterior cervical spine and sweep the esophagus  and trachea to the right side.  I then protected the trachea, esophagus,  and the thyroid, and identified the anterior longitudinal ligament.  I  swept all the deep prevertebral fascia off and then placed a needle into  the C6-7 disk space.  I then took an x-ray to confirm that this was the  appropriate level and then began the dissection.   At this point in time, I then placed a self-retaining retractor in the  wounds underneath the  longus colli muscles and deflated the  endotracheal cuff, expanded the retractors,  and reinflated the  endotracheal cuff.  I then placed distraction pins into the bodies of C6  and C7, and then distracted the C6-7 disk space.  An annulotomy was  performed with a 15 scalpel, then using a combination of pituitary  rongeurs, curettes, and Kerrison rongeurs, I resected the entire disk  material.  Then using a microcurette, I developed a plane behind the  vertebral body and using a 1 mm Kerrison, removed the posterior  osteophytes.  I then used a micro nerve hook to develop a plane  underneath the posterior longitudinal ligament and then exploited this  with a 1 mm Kerrison to resect the entire posterior longitudinal  ligament.  There was a significant hard disk osteophyte and disk  protrusion on the right side.  At this point with the diskectomy  completed, I had adequate neural decompression.  I then rasped the  endplates, measured the interbody space, and took a 6 mm interbody   spacer and packed with Actifuse and malleted into position.  I then  contoured an anterior cervical plate and I fixed it to the bodies of C6  and C7 with a 14 mm self-drilling screws.  They were locked to the  plate.  I removed all of the retractors and checked the esophagus to  ensure it was not entrapped beneath the plate.  I then closed the deep  fascia of the platysma with interrupted 2-0 Vicryl sutures and the skin  with a 3-0 Monocryl.  Steri-Strips, dry dressing, and soft collar were  applied.  The patient was extubated and transferred to the PACU without  incident.  At the end of the case, all needle and sponge counts were  correct.      Alvy Beal, MD  Electronically Signed     DDB/MEDQ  D:  07/10/2008  T:  07/11/2008  Job:  [id number]

## 2011-05-03 NOTE — Op Note (Signed)
NAMEMARCOS, PELOSO                 ACCOUNT NO.:  [id number]   MEDICAL RECORD NO.:  [id number]          PATIENT TYPE:  INP   LOCATION:  1618                         FACILITY:  [facility]   PHYSICIAN:  Ollen Gross, M.D.    DATE OF BIRTH:  [birth year]   DATE OF PROCEDURE:  07/13/2007  DATE OF DISCHARGE:                               OPERATIVE REPORT   PREOPERATIVE DIAGNOSIS:  Resection arthroplasty right knee secondary to  infection.   POSTOPERATIVE DIAGNOSIS:  Resection arthroplasty right knee secondary to  infection.   PROCEDURE:  Right total knee arthroplasty reimplantation.   SURGEON:  Ollen Gross, M.D.   ASSISTANT:  Alexzandrew L. Perkins, P.A.C.   ANESTHESIA:  General.   ESTIMATED BLOOD LOSS:  Minimal.   DRAINS:  Hemovac x1.   TOURNIQUET TIME:  Up 42 minutes at 300 mmHg, down 8 minutes, and then up  an additional 26 minutes at 300 mmHg.   COMPLICATIONS:  None.   CONDITION:  Stable to recovery.   BRIEF CLINICAL NOTE:  Ms. Kriegel is a 77 year old female who has had a  long complicated history with regards to her right knee.  She has had  revision surgery multiple times and had Enterococcus infection recently  which required resection arthroplasty of the knee.  She has gone on to  clinically heal this uneventfully and her lab values have returned to  normal.  She has had six weeks of vancomycin.  She presents now for  right total knee arthroplasty reimplantation.   PROCEDURE IN DETAIL:  After the successful initiation of general  anesthetic, a tourniquet was placed high on the right thigh, the right  lower extremity was prepped and draped in the usual sterile fashion.  The extremity was wrapped in an Esmarch and the tourniquet inflated to  300 mmHg.  A long anterior incision is made in the skin with a 10 blade  through the subcutaneous tissue to the extensor mechanism.  A fresh  blade is used make a medial parapatellar arthrotomy.  Minimal fluid is  encountered and sent  for stat gram stain and C&S which was negative.  We  also sent the frozen section tissue specimen which showed no acute  inflammation.  The antibiotic spacer was identified and removed.  We  then elevated the proximal and medial tibia subperiosteally around the  joint line.  Laterally, I was able to evert the patella easily and flex  the knee 90 degrees.  I gained access to the tibial and femoral canals.  We thoroughly debrided any abnormal looking tissue from the joint and  all the tissue actually looked very good.  I reamed the femoral canal up  to 16 mm for placement of a 15 by 125 cemented stem.  On the tibial  side, I reamed also to 16 mm and placed the 13 x 60 cemented stem.  There was significant bone loss in the femur with no collateral  ligaments left.  It was decided to do an LPS distal femoral replacement.  I subperiosteally elevated all the tissue off the remaining shell of  bone of  the femur up beyond the metaphyseal flare to the femoral shaft.  I placed retractors around the femur to protect the posterior soft  tissues.  I resected the distal femur perpendicular to the long axis of  the shaft.  All of that distal bone was then removed with further  subperiosteal elevation.   On the tibial side, we reamed the canal once again to 16 mm.  I then did  the sleeve broaching up to a 29 which had great stable rotational fit.  I resected the remaining bone off of the top of the sleeve.  Four all of  our preoperative templating, we needed to use at least a 25 mm build up  to get back to the joint line.  On the femoral side, we needed an LPS  distal femoral replacement to get back to the joint line.  On the tibial  side, we placed the trial with the 29 mm sleeve, 13 by 60 stem  extension, and then the size 2 MBT revision tapered tray which has a 25  mm buildup.  On the femoral side, we had the extra small LPS distal  femur with a 15 by 125 stem.  Both trials were placed with the 12  mm  tibial hinged insert for the extra small femur.  Full extension is  achieved without hyperextension.  In flexion, we also had excellent soft  tissue tension.  I attempted to go up to a 14 or 16 mm insert but would  not allow for full extension.  Once the trials were placed and we were  comfortable with the insert, I released the tourniquet for an initial  tourniquet time of 42 minutes and left it down for 8 minutes.  No  significant bleeding was identified.   On the back table, we set up the implants as stated above.  Once again,  a 29 mm tibial sleeve with an MBT revision tibial tray, size 225 mm  build up, and the 13 by 60 stem extension.  Then, on the femoral side,  the extra small LPS distal femoral component with the 15 x 125 stem.  I  trialed cement restrictors on the femur, size 4 was the most  appropriate, on the tibia, size 5 was the most appropriate.  I placed it  at the appropriate depth in the canals.  The tourniquet is then  reinflated to 300 mmHg.  The soft tissues were thoroughly irrigated with  pulsatile lavage as is the bony surfaces and canals.  The cement was  mixed with 2 grams of vancomycin.  We injected it into the femoral and  tibial canals.  The components were cemented into place and the trial 12  mm insert was placed, the knee held in full extension, and all extruded  cement removed.  Once the cement had fully hardened, then the permanent  12 mm hinged insert for the extra small femur is placed and locked in  with a locking pin.  Full extension is achieved.  The wound is again  copiously irrigated and the extensor mechanism closed over a Hemovac  drain with interrupted #1 PDS.  I did a lateral release to allow for  better tracking.  There was only a small shell of patella left and was  not amenable to having any components.  The tourniquet was released the  second with a time of 26 minutes.  We further irrigated and closed the  subcu with interrupted 2-0  Vicryl and the skin  with staples.  I was able  to flex her against gravity to 90.  I could easily flex her down further  by applying gentle pressure against gravity, she had 90 degrees.  The  drain was  hooked to suction, the incisions cleaned and dried, and a  bulky sterile dressing applied.  She was placed into a knee immobilizer,  awakened, and transferred to the recovery room in stable condition.      Ollen Gross, M.D.  Electronically Signed     FA/MEDQ  D:  07/13/2007  T:  07/13/2007  Job:  [id number]

## 2011-05-03 NOTE — Discharge Summary (Signed)
[name], [name]                 ACCOUNT NO.:  [id number]   MEDICAL RECORD NO.:  [id number]          PATIENT TYPE:  INP   LOCATION:  1620                         [facility]AC[facility]   PHYSICIAN:  Ollen Gross, M.D.    DATE OF BIRTH:  06-Nov-1931   DATE OF ADMISSION:  06/04/2007  DATE OF DISCHARGE:                               DISCHARGE SUMMARY   ADMISSION DIAGNOSES:  .  1. Infected right total knee.  2. Hypertrophic obstructive cardiomyopathy.  3. Complete heart block requiring pacer.  4. Mild mitral regurgitation.  5. Hypertension.  6. Anemia.  7. Mitral valve insufficiency.  8. Mild asthma.   DISCHARGE DIAGNOSES:  1. Infected right total knee, status post resection arthroplasty with      placement of antibiotic spacer.  2. Postoperative blood loss anemia.  3. Status post transfusion without sequelae.  4. Hypertrophic obstructive cardiomyopathy.  5. Mild mitral regurgitation.  6. Hypertension.  7. Anemia.  8. Mitral valve insufficiency.  9. Mild asthma.   OPERATION/PROCEDURE:  On June 04, 2007, right knee resection  arthroplasty with placement antibiotic spacer.   SURGEON:  Ollen Gross, M.D.   ASSISTANT:  Alexzandrew L. Perkins, P.A.C.   ANESTHESIA:  General.   CONSULTATIONS:  None.   BRIEF HISTORY:  [name] is a 77 year old female with known chronic  infected right total knee.  She presents now to have the right knee  resected and placement of antibiotic spacer.  She has grown enterococcus  from the knee in the past.  She had a previous I&D plus antibiotics for  the swelling and sepsis returned.   LABORATORY DATA:  Preoperative CBC showed a hemoglobin of 1.3,  hematocrit of 34.3, white cell count 6.6, postoperatively hemoglobin  9.4, drifted down to 8.5.  She was given two units of blood.  Post  transfusion hemoglobin back up to 11.  Hematocrit 32.9.  PT/PTT on  admission 13.9, 34 respectively.  INR 1.  Serial protimes followed.  Last noted protime INR  was 25.4 and 2.2.  Chem panel on admission all  within normal limits with the exception of low albumin at 3.3 and  electrolytes followed.  Her BMET and  electrolytes remained within  normal limits.  Preoperative urinalysis showed leukocyte esterase, few  epithelials, 0-2 white cells. Followup urinalysis on June 07, 2007  negative.  Urine culture on June 07, 2007, no growth.  Cultures:  Abscess culture taken on June 04, 2007, Gram stain showed no organisms,  no growth.  Anaerobic culture, Gram stain showed no organisms, no  anaerobes isolated.   X-rays:  Chest x-ray, April 12, 2007:  Left upper extremity PICC to the  SVC about 1.4 cm above the cavoatrial junction during right shoulder  arthroplasty.  No active disease.   EKG on April 05, 2007:  Electronic ventricular pacemaker, sinus  tachycardia, left bundle branch block, __________ .   HOSPITAL COURSE:  The patient was admitted to [facility] and  taken to the operating room.  Underwent the above described procedure  without complications.  The patient tolerated the procedure well and  was  later returned to the recovery room and then to the floor.  Was started  on PCA and analgesics for pain control following surgery.  Given  vancomycin IV and started on vancomycin protocol for the knee infection  which was titrated and followed by pharmacy.  She was also placed on  Coumadin for DVT prophylaxis,.  She already had a PICC line placed for  access.  On day #1, she was hurting a fair amount due to the placement  of the antibiotic spacer.  It is required that the patient must stay in  her knee immobilizer at all times when she is transferring or  ambulating.  She can have the knee immobilizer open when she is in the  bed.  Hemoglobin was down to 9.4.  She was placed on some iron  supplements. She had a little bit of asymptomatic hypotension  postoperatively.  She was given some gentle fluids.  Unfortunately  hemoglobin dropped on  day #2 down to 8.5.  She was seen in rounds and  recommended blood.  She did receive 2 units of blood on postoperative  day #2 and tolerated the blood well.  No problems.  She was slowly  progressing with physical therapy and it was felt if she did well, that  she could possibly go home.  She wanted to look into trying to go home  at the end of the week.  Dressing was changed on day #2.  Dressing was  changed on day #2.  Incision looked good.  There is no obvious erythema  surrounding the incision.  The incision looked good and the tissues were  in good condition.  It was noted over the next couple of days the  patient was slowly progressing with her physical therapy, only getting  up with moderate assistance, only about five feet by postoperative day  #3.  It was felt she was not ready to go home and she said her husband  was in no condition to provide the level of care which she was  requiring.  Therefore, got case management involved to assist with  placement of the patient.  FL2 was signed and sent.  She remained  through the weekend.  She was continuing to receive therapy, albeit slow  progress.  Incision remained in good condition.  She was seen back on  rounds the first of the week on Monday.  The patient had a rough weekend  because of pain and slow progress.  She is doing a little bit better.  She had been weaned off the PCA after about three days  Foley had been  removed earlier in the hospitalization.  The urine was checked because  it was left in for three or four days.  Urinalysis was negative.  She  was seen on rounds, doing fairly well on Monday.  There was a  possibility that she could possibly go to the nursing facility later  that day.  Case management was working on it.  She was in agreement.  We  were looking into one facility close to home but were still waiting for  bed offers.  If bed offers became available, then she could possibly go;  hence the tentative  discharge, awaiting final offers.   DISCHARGE PLAN:  1. Tentative discharge on June 11, 2007 pending bed offers.  2. Discharge diagnoses:  Please see above.   DISCHARGE MEDICATIONS:  1. Coumadin protocol.  Please titrate the Coumadin for a target INR of  between 2 and 3.  She needs to be on Coumadin a minimum of three      weeks.  2. Colace 100 mg p.o. b.i.d.  3. Vancomycin protocol per pharmacy.  Will have the dosages sent over.  4. Toprol-XL 100 mg p.o. daily.  5. Nu-Iron 150 mg p.o. b.i.d. for three more weeks from today's date      of June 11, 2007.  6. Laxative of choice.  Enema of choice.  7. Percocet 5 mg one or two every four to six hours as needed for      pain.  8. Tylenol 325 mg one or two every four to six hours as needed for      mild pain temporal headache.  9. Reglan 10 mg p.o. q.8h. p.r.n. nausea.  10.Robaxin 500 mg p.o. or IV q.6h. p.r.n. spasm.  11.Ambien 5 mg p.o. at bedtime p.r.n. sleep.  12.Ultracet 2 p.o. q.4 h. p.r.n. mild pain.  13.Benadryl 25 mg p.o. q.6h. p.r.n. itching.  14.Xanax 0.25 mg p.o. q.6h. p.r.n. anxiety.   DIET:  Heart healthy cardiac diet.   ACTIVITY:  She may be weightbearing as tolerated to the right lower  extremity, but she must have her knee immobilizer on at all times.  The  only time she can open her knee immobilizer is if she is sitting down  and needs to ice the knee with the supported.  She may remove the knee  immobilizer for bathing.  Otherwise she needs to have it on at all  times.  She may start showering.   FURTHER INSTRUCTIONS:  May remove her staples on Thursday, June 14, 2007.  Please remove the staples and apply Steri-Strips across the  incision. She will be on the IV vancomycin for a total of six weeks.   DISPOSITION:  Pending.  We are awaiting bed offers.   CONDITION ON DISCHARGE:  She is improving but discharge is tentative  pending bed availability.      Alexzandrew L. Perkins, P.A.C.      Ollen Gross, M.D.  Electronically Signed    ALP/MEDQ  D:  06/11/2007  T:  06/11/2007  Job:  [id number]   cc:   Ollen Gross, M.D.  Fax: [phone]   Antionette Char, MD  Fax: [phone]   [name], M.D.

## 2011-05-03 NOTE — Discharge Summary (Signed)
[name], [name]                 ACCOUNT NO.:  [id number]   MEDICAL RECORD NO.:  [id number]          PATIENT TYPE:  INP   LOCATION:  1618                         FACILITY:  [facility]   PHYSICIAN:  [name], M.D.    DATE OF BIRTH:  06-18-1931   DATE OF ADMISSION:  07/13/2007  DATE OF DISCHARGE:  07/17/2007                               DISCHARGE SUMMARY   ADMITTING DIAGNOSES:  1. 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  2. Hypertrophic obstructive cardiomyopathy, required septal myotomy.  3. Complete heart block post surgical requiring pacer.  4. Mild mitral regurgitation.  5. Hypertension.  6. Anemia.  7. Mitral valve insufficiency.  8. Mild asthma.  9. History of bronchitis.  10.History of hemorrhoids.  11.History of cystitis.  12.Urinary incontinence.   DISCHARGE DIAGNOSES:  1. Resection arthroplasty right knee secondary to infection status      post right total knee arthroplasty reimplantation.  2. Postoperative blood loss anemia.  3. Status post transfusion without sequelae.  4. Postoperative hypokalemia, improved.  5. Mild postoperative hyponatremia, improved.  6. Hypertrophic obstructive cardiomyopathy, required septal myotomy.  7. Complete heart block post surgical requiring pacer.  8. Mild mitral regurgitation.  9. Hypertension.  10.Anemia.  11.Mitral valve insufficiency.  12.Mild asthma.  13.History of bronchitis.  14.History of hemorrhoids.  15.History of cystitis.  16.Urinary incontinence.   PROCEDURE:  July 13, 2007:  Reimplantation right total knee  arthroplasty.  Surgeon Dr. [name]; assistant Sharon Peace, PA-C.  Anesthesia:  General.   CONSULTS:  None.   BRIEF HISTORY:  [name] is a 77 year old female with a long  complicated history with regards to her right knee.  She has had  revision surgery multiple times, enterococcus infection recently,  required resection arthroplasty.  She has gone on to clinically heal and  her lab values  have returned to normal.  She has had 6 weeks of  vancomycin and now presents for reimplantation.   LABORATORY DATA:  Preoperative CBC showed a hemoglobin of 12.6,  hematocrit of 37.3, white cell count normal at 6.7.  Postoperative  hemoglobin 8.4, drifted down to 8, given 2 units of blood.  Post  transfusion hemoglobin back up to 10.5 and 30.0.  Sed rate  preoperatively almost returned to normal at 23.  PT/PTT preoperatively  13.9 and 30 respectively.  INR 1.1.  Serial pro times followed.  Last  noted PT/INR 20.7 and 1.7.  Chem panel on admission:  Minimally elevated  potassium at 5.3, remaining chem panel within normal limits.  Serial  BMETs were followed.  Sodium did drop from 144 to 134, back up to 140.  Potassium dropped from 5.3 to 3.4, back up to 4.7.  C-reactive protein  0.5.  Preoperative UA negative.  Blood group/type O positive.  STAT Gram  stain:  Few wbc's, no organisms.  Tissue cultures taken:  No squamous,  no organisms, no growth.  Anaerobe:  No anaerobes isolated.  Two-views  of the knee July 13, 2007:  Revision right total knee arthroplasty,  anatomic alignment, expected postoperative changes.  Two-view chest  April 17:  Mild cardiac enlargement without heart failure.   HOSPITAL COURSE:  The patient was admitted to [facility],  tolerated procedure well, later transferred to the recovery room and the  orthopedic floor.  Started on PCA and p.o. analgesic for pain control  following surgery.  Continued on her vancomycin postoperatively.  She  did fairly well through the evening, considering the large procedure she  had done.  Doing pretty well in the morning of day #1 although her  hemoglobin was down to 8.4; she was asymptomatic with this.  Started  getting up out of bed.  Hemovac drain pulled without difficulty.  By day  #2 her hemoglobin dropped a little bit further down to 8.0.  That is  when she was given 2 units of blood.  She tolerated the blood well.   Started getting up a little bit more with therapy through the weekend,  slowly progressing.  She was up ambulating approximately about 40 feet  with minimal assist.  She was actually doing very well, continued to  progress well, and by day #3 she doing a little bit better.  Potassium  had dropped down, likely due to some dilution secondary to fluids.  She  was given potassium supplements and responded well and came right back  up to 4.7 the next day.  Dressing changed on day #2 and was changed  daily after that.  Incision was healing well.  Continued to progress  well and by postoperative day #4 the patient was doing well, getting up  with therapy, and was ready go home.   DISCHARGE/PLAN:  1. The patient discharged home on July 17, 2007.  2. Discharge diagnoses:  Please see above.  3. Discharge medications:  Coumadin, Percocet, Robaxin.  4. Diet:  Resume previous home diet.  5. Activity:  She is weightbearing as tolerated, home health PT, home      health nursing, total knee protocol, range of motion and      strengthening exercises.  6. Follow up 2 weeks after surgery.  Contact the office to arrange an      appointment.   DISPOSITION:  Home.   CONDITION UPON DISCHARGE:  Improving.      [name], P.A.C.      [name], M.D.  Electronically Signed    ALP/MEDQ  D:  07/17/2007  T:  07/17/2007  Job:  [id number]   cc:   Sharon Maroon, MD  Fax: [phone]   Sharon Char, MD  Fax: [phone]

## 2011-05-03 NOTE — H&P (Signed)
[name], [name]                 ACCOUNT NO.:  [id number]   MEDICAL RECORD NO.:  [id number]          PATIENT TYPE:  INP   LOCATION:  NA                           FACILITY:  [facility]   PHYSICIAN:  Ollen Gross, M.D.    DATE OF BIRTH:  1931-02-23   DATE OF ADMISSION:  06/04/2007  DATE OF DISCHARGE:                              HISTORY & PHYSICAL   HISTORY OF PRESENT ILLNESS:  The patient is 77 year old female who has  been seen by Dr. [name] for ongoing infection of the infection the  right total knee.  She has recurrent swelling.  She has been treated  with IV vancomycin, but continues to show signs of infection.  It is  felt she would best be served by undergoing resection arthroplasty with  placement of the antibiotic spacer.  Risks and benefits have been  discussed.  She elects to proceed with surgery.   ALLERGIES:  MORPHINE CAUSES ITCHING.  INTOLERANCE TO CODEINE, CAUSES  NAUSEA AND VOMITING.   CURRENT MEDICATIONS:  1. Multivitamin.  2. Toprol.  3. Meloxicam.  4. Stool softener.  5. Hydrocodone.   PAST MEDICAL HISTORY:  1. Hypertrophic obstructive cardiomyopathy, which she required a      septal myotomy for.  2. Complete heart block postsurgical requiring pacer.  3. Mild mitral regurgitation.  4. Hypertension.  5. Anemia.  6. Mitral valve insufficiency.  7. Mild asthma.   PAST SURGICAL HISTORY:  1. Septal myotomy.  2. Pacemaker placement.  3. Bilateral total knee replacement arthroplasties.  4. Right femur fracture surgery.  5. Right wrist fracture surgery.  6. Right shoulder surgery.  7. Left breast biopsy which was negative.  8. Colonoscopy.   SOCIAL HISTORY:  Married, retired, nonsmoker.  No alcohol.  Two  children.  Husband will be assisting with care after surgery.   FAMILY HISTORY:  Mother with history of heart disease.  Mother and  father both with history of pancreatic cancer, and also an aunt with  pancreatic cancer.  Brother with diabetes.   REVIEW  OF SYSTEMS:  GENERAL:  No fevers, chills, night sweats.  NEUROLOGICAL:  No seizures, syncope, paralysis.  RESPIRATORY:  No  shortness breath, productive cough or hemoptysis.  CARDIOVASCULAR:  History of cardiomyopathy, complete heart block requiring pacer.  No  chest pain, angina, or orthopnea.  GI:  No nausea, diarrhea, but she  does have constipation with pain medications.  GU: No dysuria, hematuria  or discharge.  MUSCULOSKELETAL:  Right knee.   PHYSICAL EXAMINATION:  VITAL SIGNS:  Pulse 64, respirations 12, blood  pressure 122/68.  GENERAL:  A 75-year white female, well-nourished, well-developed, no  acute distress.  She is alert and cooperative, pleasant.  HEENT: Normocephalic, atraumatic.  Pupils round and reactive.  Oropharynx clear.  EOMs intact.  Noted to wear glasses.  Upper partial  plate.  NECK:  Supple.  CHEST:  Clear.  HEART:  Regular rate and rhythm.  Systolic ejection murmur is noted.  ABDOMEN:  Soft, nontender.  Bowel sounds present.  BREASTS/RECTAL/GU:  Not done, not pertinent to present illness.  EXTREMITIES:  Right  knee shows a large intra-articular effusion.  Motor  function intact.  Sensation intact.   IMPRESSION:  1. Affected right total knee.  2. Hypertrophic obstructive cardiomyopathy.  3. Complete heart block requiring pacer  4. Mild mitral regurgitation.  5. Hypertension.  6. Anemia.  7. Mitral valve insufficiency.  8. Mild asthma.   PLAN:  The patient admitted to [facility] to undergo a right  total knee resection arthroplasty with placement of antibiotic spacer.  Surgery will be performed by Ollen Gross.      Alexzandrew L. Perkins, P.A.C.      Ollen Gross, M.D.  Electronically Signed    ALP/MEDQ  D:  06/03/2007  T:  06/04/2007  Job:  [id number]   cc:   Ollen Gross, M.D.  Fax: [phone]   Antionette Char, MD  Fax: [phone]   [facility] Dr. [name]

## 2011-05-03 NOTE — H&P (Signed)
NAMECIARA, [name]                 ACCOUNT NO.:  [id number]   MEDICAL RECORD NO.:  [id number]          PATIENT TYPE:  INP   LOCATION:  0002                         FACILITY:  [facility]   PHYSICIAN:  Ollen Gross, M.D.    DATE OF BIRTH:  02-26-1931   DATE OF ADMISSION:  07/13/2007  DATE OF DISCHARGE:                              HISTORY & PHYSICAL   DATE OF OFFICE VISIT HISTORY AND PHYSICAL:  July 10, 2007.   CHIEF COMPLAINT:  Infected right total knee.   HISTORY OF PRESENT ILLNESS:  The patient is a 77 year old female well-  known to Dr. Ollen Gross, having undergone resection arthroplasty for  an infected total knee.  She has undergone her appropriate IV  antibiotics and now she is ready for her reimplantation.  Risks and  benefits discussed.  The patient is subsequently admitted to the  hospital.   ALLERGIES:  SULFA, CODEINE and MORPHINE.  MORPHINE causes inching.  CODEINE causes nausea and vomiting.   CURRENT MEDICATIONS:  Oxycodone, Toprol XL, meloxicam, Senna S, stool  softener, multivitamin, and she has been on vancomycin IV.   PAST MEDICAL HISTORY:  1. Hypertrophic obstructive cardiomyopathy, which she required a      septal myotomy for.  2. Complete heart block postsurgical requiring pacer.  3. Mild mitral regurgitation.  4. Hypertension.  5. Anemia.  6. Mitral valve insufficiency.  7. Mild asthma.  8. History of bronchitis.  9. History of hemorrhoids.  10.History of cystitis.  11.Urinary incontinence.   PAST SURGICAL HISTORY:  1. Septal myotomy.  2. Pacemaker placement.  3. Bilateral total knee replacement arthroplasties.  4. Right femur fracture surgery.  5. Right wrist fracture surgery.  6. Right shoulder surgery.  7. Left breast biopsy, which was negative.  8. Colonoscopy.  9. Hysterectomy.   SOCIAL HISTORY:  Married, nonsmoker.  No alcohol.  Two children.  Husband will be assisting with care after surgery.   FAMILY HISTORY:  Mother with a history of  heart disease, hypertension.  Both parents with a history of pancreatic cancer.  Also an aunt with  pancreatic cancer.  Brother with diabetes.  Another brother with a  history of enlarged heart.   REVIEW OF SYSTEMS:  GENERAL:  No fevers, chills, night sweats.  NEURO:  No seizure, syncope or paralysis.  RESPIRATORY: No shortness of breath,  productive cough or hemoptysis.  CARDIOVASCULAR:  History of  cardiomyopathy.  Has had a septal myotomy with complete heart block  requiring pacer.  No chest pain, angina or orthopnea.  GI: No nausea,  vomiting, diarrhea or constipation.  GU:  A little bit of urinary  frequency and nocturia and some urgency.  No dysuria or hematuria.  MUSCULOSKELETAL:  Right knee.   PHYSICAL EXAM:  VITAL SIGNS:  Pulse 64, respirations 12, blood pressure  114/68.  GENERAL:  A 77 year old white female, well-nourished, well-developed, in  no acute distress.  She is alert and oriented and cooperative.  HEENT: Normocephalic, atraumatic.  Pupils round and reactive.  Oropharynx clear.  EOMs intact.  Does have an upper partial plate.  NECK:  Supple.  CHEST:  Clear anterior-posterior chest walls.  No rhonchi, rales,  wheezing.  HEART:  Regular rate and rhythm with a systolic ejection murmur noted.  S1-S2 noted.  ABDOMEN:  Soft, nontender.  Bowel sounds present.  RECTAL, BREASTS, GENITALIA:  Not done, not pertinent to present illness.  EXTREMITIES:  Right knee shows a well-healed incision, no erythema no  effusion.  No signs of infection.  Motor function intact.   IMPRESSION:  Infected right total knee.   PLAN:  The patient will be admitted to [facility] to undergo a  reimplantation of the right total knee.  Surgery will be performed by  Dr. Ollen Gross.      Alexzandrew L. Perkins, P.A.C.      Ollen Gross, M.D.  Electronically Signed    ALP/MEDQ  D:  07/12/2007  T:  07/13/2007  Job:  [id number]   cc:   Massie Maroon, MD  Fax: [phone]   Antionette Char, MD  Fax: [phone]

## 2011-05-06 ENCOUNTER — Ambulatory Visit
Admission: RE | Admit: 2011-05-06 | Discharge: 2011-05-06 | Disposition: A | Discharge status: Home / Self Care | Payer: Medicare Other | Source: Ambulatory Visit | Attending: Radiation Oncology | Admitting: Radiation Oncology

## 2011-05-06 DIAGNOSIS — Z950 Presence of cardiac pacemaker: Secondary | ICD-10-CM | POA: Insufficient documentation

## 2011-05-06 DIAGNOSIS — J45909 Unspecified asthma, uncomplicated: Secondary | ICD-10-CM | POA: Insufficient documentation

## 2011-05-06 DIAGNOSIS — Z170 Estrogen receptor positive status [ER+]: Secondary | ICD-10-CM | POA: Insufficient documentation

## 2011-05-06 DIAGNOSIS — Z510 Encounter for antineoplastic radiation therapy: Secondary | ICD-10-CM | POA: Insufficient documentation

## 2011-05-06 DIAGNOSIS — Z79899 Other long term (current) drug therapy: Secondary | ICD-10-CM | POA: Insufficient documentation

## 2011-05-06 DIAGNOSIS — Z800 Family history of malignant neoplasm of digestive organs: Secondary | ICD-10-CM | POA: Insufficient documentation

## 2011-05-06 DIAGNOSIS — M1990 Unspecified osteoarthritis, unspecified site: Secondary | ICD-10-CM | POA: Insufficient documentation

## 2011-05-06 DIAGNOSIS — Z803 Family history of malignant neoplasm of breast: Secondary | ICD-10-CM | POA: Insufficient documentation

## 2011-05-06 DIAGNOSIS — I428 Other cardiomyopathies: Secondary | ICD-10-CM | POA: Insufficient documentation

## 2011-05-06 DIAGNOSIS — Z7982 Long term (current) use of aspirin: Secondary | ICD-10-CM | POA: Insufficient documentation

## 2011-05-06 DIAGNOSIS — D0590 Unspecified type of carcinoma in situ of unspecified breast: Secondary | ICD-10-CM | POA: Insufficient documentation

## 2011-05-06 DIAGNOSIS — Z96659 Presence of unspecified artificial knee joint: Secondary | ICD-10-CM | POA: Insufficient documentation

## 2011-05-06 NOTE — Op Note (Signed)
NAMEBETHANY, [name]                 ACCOUNT NO.:  [id number]   MEDICAL RECORD NO.:  [id number]          PATIENT TYPE:  INP   LOCATION:  1505                         FACILITY:  [facility]   PHYSICIAN:  Ollen Gross, M.D.    DATE OF BIRTH:  October 09, 1931   DATE OF PROCEDURE:  04/06/2007  DATE OF DISCHARGE:                               OPERATIVE REPORT   PREOPERATIVE DIAGNOSIS:  Right proximal humerus fracture with  osteoarthritis of the shoulder.   POSTOPERATIVE DIAGNOSIS:  Right proximal humerus fracture with  osteoarthritis of the shoulder.   PROCEDURE:  Right shoulder hemiarthroplasty.   SURGEON:  Ollen Gross, M.D.   ASSISTANT:  Alexzandrew L. Perkins, P.A.-C.   ANESTHESIA:  General.   ESTIMATED BLOOD LOSS:  200 mL.   DRAIN:  None.   COMPLICATIONS:  None.   CONDITION:  Stable to recovery.   BRIEF CLINICAL NOTE:  [name] is a 77 year old female who has a long  history of osteoarthritis of the shoulder.  She has had injections in  the past for the arthritis.  Unfortunately, she had a fall earlier this  week sustaining a proximal humeral fracture with displacement and  comminution.  It has been decided since she has the underlying  arthritis, to go ahead and perform a shoulder hemiarthroplasty as  opposed to considering doing an open reduction and internal fixation.  She presents now for the above mentioned procedure.   PROCEDURE IN DETAIL:  After the successful administration of general  anesthetic, the patient was placed in a modified beach chair position  and her right upper extremity was isolated from her trunk with plastic  drapes and prepped and draped in the usual sterile fashion.  A standard  deltopectoral approach is made, the skin cut with a 10 blade through the  subcutaneous tissue to the deltopectoral interval.  The cephalic vein is  dissected and is retracted laterally with the deltoid.  The  deltopectoral interval was exposed and conjoined tendon  identified.  Retractors were placed proximally at the lateral border of the conjoined  tendon just below the coracoid process.   The fracture was identified and is comminuted.  The humeral head is  comminuted in multiple fragments.  We separated out the greater and  lesser tuberosities.  The subscap was attached to the lesser and the  supraspinatus and infraspinatus attached to the greater.  The remainder  of the bony fragments were removed and later used for bone graft.  The  glenoid was identified and has some chondromalacia but no full thickness  cartilage loss.  Upon removing the humeral head, you could see the  multiple fragments and the large bone spurs that were present at the  periphery.  The humeral shaft was identified and reaming is initiated  with the DePuy global reamers up to a size 12.  A size 12 trial was  placed.  Based on the glenoid size, I went with a size 44 by 18 head  trial.  We placed the stem at the appropriate height based on the  reduction of the humerus.  We had  a stable reduction with about 50%  posterior and 50% inferior translation and marked that height on the  stem.   We then thoroughly irrigated the humeral canal and mixed the cement.  We  then packed the cement in the canal and cemented in the [employer]  size 12 stem in about 15-20 degrees of retroversion.  Once the cement  had fully hardened, we again inspected the glenoid and there were no  further bony fragments.  I attached sutures to the greater and lesser  tuberosities.  The 44 by 18 head is then placed and the shoulder  reduced.  We sewed the tuberosity back to the stem and to the shaft and  to each other with Ethilon suture.  This was felt to be a stable  reduction.  The wound was then irrigated and the deltopectoral closed  with interrupted #1 Vicryl, subcu closed with interrupted 2-0 Vicryl,  and subcuticular running 4-0 Monocryl.  The incision was cleaned, dried,  and Steri-Strips and  a bulky sterile dressing was applied.  She is  placed into a shoulder immobilizer, awakened, and transferred to  recovery in stable condition.      Ollen Gross, M.D.  Electronically Signed     FA/MEDQ  D:  04/06/2007  T:  04/07/2007  Job:  [id number]

## 2011-05-06 NOTE — Op Note (Signed)
   [name], [name]                     ACCOUNT NO.:  [id number]   MEDICAL RECORD NO.:  [id number]                   PATIENT TYPE:  AMB   LOCATION:  ENDO                                 FACILITY:  [facility]   PHYSICIAN:  Georgiana Spinner, M.D.                 DATE OF BIRTH:  1931/03/01   DATE OF PROCEDURE:  DATE OF DISCHARGE:                                 OPERATIVE REPORT   PROCEDURE:  Colonoscopy.   INDICATIONS:  Hemoccult positivity.   ANESTHESIA:  Demerol 50 and Versed 5 mg.   PROCEDURE:  With the patient mildly sedated in the left lateral decubitus  position, the Olympus videoscopic colonoscope was inserted in the rectum and  passed under direct vision to the cecum, identified by the ileocecal valve  and appendiceal orifice, both of which were photographed.  From this point,  the colonoscope was slowly withdrawn, taking circumferential views of the  colonic mucosa, stopping only in the rectum, which appeared normal in direct  and retroflexed view.  The endoscope was straightened and withdrawn.  The  patient's vital signs and pulse oximeter remained stable.  The patient  tolerated the procedure well without apparent complications.   FINDINGS:  Diverticulosis of the sigmoid colon.  Otherwise, unremarkable  exam.   PLAN:  Repeat examination in five to ten years.                                               Georgiana Spinner, M.D.    GMO/MEDQ  D:  11/03/2003  T:  11/03/2003  Job:  [id number]

## 2011-05-06 NOTE — Op Note (Signed)
[facility]  Patient:    [name] Visit Number: [id number] MRN: [id number]          Service Type: SUR Location: 4W [id number] 02 Attending Physician:  Loanne Drilling Dictated by:   Ollen Gross, M.D. Proc. Date: 10/29/01 Admit Date:  10/29/2001                             Operative Report  PREOPERATIVE DIAGNOSES: 1. 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 2. Plantar fasciitis of the left knee.  POSTOPERATIVE DIAGNOSES: 1. 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 2. Plantar fasciitis of the left knee.  OPERATION: 1. Right total knee arthroplasty revision 2. Left plantar fascial injection.  BRIEF CLINICAL NOTE:  [name] is a 77 year old female who underwent a total knee revision approximately two to three years ago and developed significant pain at the tip of her stem.  She subsequently went on to get some loosening in the tibial component and presents now for revision.  DESCRIPTION OF PROCEDURE:  After the successful administration of general anesthetic, tourniquet was placed on the right thigh.  The right lower extremity was prepped and draped in the usual sterile fashion.  Extremities were wrapped with Esmarch, knee flexed, tourniquet inflated to 350 mmHg.  A standard midline incision was made, skin cut with 10 blade, subcutaneous tissue at the level of the extensor mechanism.  Fresh blade was used to make a median parapatellar arthrotomy and soft tissue with proximal medial tibia subperiosteally elevated at the joint line with the knife and semimembranous dispersed with a curved osteotome.  There was some fibrinous material.  No fluid upon entering the joint.  This is all removed and the overall tissue appearance was healthy.  Tibial component is grossly loose.  I had to do quadriceps snip to evert the patella.  The patella was everted and knee flexed 90 and the tibia is rocking within the canal.  The  long stem tibial component subsequently removed.  She had a large medial bone defect both cortical and cancellus.  Once we cleaned off all the soft tissue around the defect, we decided to utilize a femoral head Allograft to reconstruct her back to a more appropriate height.  The Allograft is then fashioned utilizing the high speed bur and it is impacted into her defect.  I utilized the bur also to clean the edges of the defect and get to a more healthy appearing bony bed.  I impacted the Allograft into the defect and there was a tremendous press fit.  I did not need to stabilize it with a pin.  On the medial side, we subsequently cut it down to a more healthy bony level.  First we did the medullary canal reaming up to 16 and put the extra medullary guide on for alignment. Resected out the top of the bone graft and then used a 10 mm wedge step laterally.  The medial side was the site that was built up.  The wedge step trial was placed with a 15 mm splinted stem, 60 mm in length with a size 2 tibia.  This had a fantastic fit all around.  At this point, we decided to go ahead and use that as the permanent construct sizes.  A canal restrictor was then placed into the tibial canal and tourniquet released.  The total tourniquet time was 45 minutes.  There was no bleeding posteriorly. It was down  for five minutes and then reinflated again.  The canal was then irrigated with pulsatile lavage and cement mix.  Once ready for implantation, the cement was then injected.  The canal pressurized and proximally the size 2 tibial component with a 15 x 60 cemented stem and a 10 mm block augment laterally is placed into the tibial canal.  The fit was excellent and a 20 mm trial insert was place. The knee was held in full extension.  All extruded cement removed.  Once it was fully hardened a permanent 20 mm GC3 insert was impacted into the tibial tray. She had great varus valgus balance at full extension down  through over 120 degrees of flexion.  At this point, the wound was copiously irrigated with antibiotic solution and quadriceps snip repaired with interrupted #1 PDS.  We took and AP and lateral x-ray which showed the construct to be in good position.  The extensor mechanism was then closed over Hemovac drain with interrupted #1 PDS and the tourniquet deflated for a total time with the two tourniquet times of 72 minutes.  The subcutaneous was then closed with interrupted 2-0 Vicryl and skin with staples.  The incision was clean and dry.  A bulky sterile dressing applied and drain hooked to suction.  She was placed into a knee immobilizer. We subsequently prepped the left heel and did a plantar fascial injection. This was with 1 cc of xylocaine and 1 cc of Depo-Medrol.  She is then awakened and transported to recovery in stable condition. Dictated by:   Ollen Gross, M.D. Attending Physician:  Loanne Drilling DD:  10/29/01 TD:  10/30/01 Job: [id number]

## 2011-05-06 NOTE — H&P (Signed)
[facility]  Patient:    [name], [name] Visit Number: [id number] MRN: [id number]          Service Type: Attending:  Ollen Jackson, M.D. Dictated by:   [name], P.A.-C. Adm. Date:  10/29/01                           History and Physical  DATE OF BIRTH:  Jul 16, 1931  CHIEF COMPLAINT:  Right knee pain.  HISTORY OF PRESENT ILLNESS:  [name] is a pleasant 77 year old female who has a history of a right total knee arthroplasty.  She has continued to have a considerable amount of pain in the right pretibial area.  Bone scan obtained in the office suggested a possible loosening of the tibial component.  Her pain has been relatively unresponsive to p.o. pain medications, and she is to the point where she wants to get something done about it.  The risks and benefits as well as the procedure were discussed with the patient.  The possibility that this might not completely address her pain was discussed with the patient.  She, however, elected to proceed.  MEDICATIONS: 1. Toprol XL 400 mg 1 p.o. q.d. 2. Diovan 80 mg 1 p.o. q.d. 3. Vioxx 25 mg 1 p.o. q.d. She was instructed to stop this three days    prior to surgery. 4. Ultracet 37.5 mg 1 to 2 p.o. q.4-6h. p.r.n. pain. 5. Vagifem 25 mg inserted vaginally.  ALLERGIES:  CODEINE causes nausea and vomiting.  PAST MEDICAL HISTORY: 1. History of hypertrophic cardiomyopathy. 2. 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 3. Hypertension. 4. History of bilateral total knee arthroplasty. 5. History of anemia. 6. History of mitral valve insufficiency murmur.  PAST SURGICAL HISTORY: 1. Hysterectomy in 1977. 2. Right knee osteotomy in 1976. 3. Bone grafting, right leg in 1972. 4. Right total knee replacement with arthroplasty in 1986 with    revision of polyethylene liner April 1999. 5. Left total knee arthroplasty in 1996.  SOCIAL HISTORY:  The patient is married.  She denies any alcohol or tobacco [place].  She  plans for home health PT following discharge from hospital.  She has not donated any autologous blood for this procedure.  FAMILY HISTORY:  Mother is deceased at age 37 with a history of hypertension, heart disease, pancreatic cancer, arthritis, and angina.  Father deceased at age 75 also with a history of pancreatic cancer and emphysema.  The patient did have one brother who had a cardiac arrest during a surgical procedure.  REVIEW OF SYSTEMS:  General: No fever, chills, night sweats, or bleeding tendencies.  Pulmonary: No history of shortness of breath, productive cough, or hemoptysis.  Cardiovascular: The patient says she has occasional dyspnea on exertion with occasional irregular heart palpitations.  No chest pain or orthopnea.  Endocrine: No history of hypothyroidism or hypertension.  GI: Occasional constipation.  No diarrhea or melena.  No nausea or vomiting.  GU: No dysuria, hematuria, or discharge.  Neurologic: No seizures, headaches, or paralysis.  PHYSICAL EXAMINATION:  GENERAL:  Alert and oriented x 3, well-developed, well-nourished white female.  VITAL SIGNS:  Pulse 59, respirations 20, blood pressure 150/90.  HEENT:  Head is atraumatic, normocephalic.  Oropharynx is clear.  NECK:  Supple.  Negative carotid bruits bilaterally.  CHEST:  Lungs clear to auscultation bilaterally.  No wheezes, rhonchi, or rales.  BREASTS:  Not pertinent to present illness.  ADENOPATHY:  Negative for  cervical lymphadenopathy palpated on exam.  HEART:  There is an S1 and S2.  Hear is regular rate and rhythm.  There is a grade 4/6 systolic ejection murmur heard best in the aortic area that radiates throughout the entire precordium.  ABDOMEN:  Soft, nontender.  Positive bowel sounds.  Abdomen is slightly round.  GU:  No pertinent to present illness.  EXTREMITIES:  She walks with an antalgic gait.  She has pain with flexion and extension to her knee.  Range of motion is 0 to 130 degrees.   She has pain on palpation at the tip of the stem along her pretibial area.  No instability noted about the knee.  SKIN:  Acyanotic.  No rashes or lesions appreciated on exam.  LABORATORY DATA:  Preop labs and x-rays are pending at this time.  IMPRESSION: 1. Loosened tibial component of right total knee arthroplasty. 2. Hypertrophic cardiomyopathy. 3. Hypertension.  PLAN:  The patient is scheduled for right total knee arthroplasty revision pending cardiac clearance.  She has an appointment with her medical doctor, Dr. Lendell Jackson.  He monitors her heart. Dictated by:   [name], P.A.-C. Attending:  Ollen Jackson, M.D. DD:  10/23/01 TD:  10/24/01 Job: [id number]

## 2011-05-06 NOTE — Op Note (Signed)
[name], [name]                 ACCOUNT NO.:  [id number]   MEDICAL RECORD NO.:  [id number]          PATIENT TYPE:  INP   LOCATION:  1505                         FACILITY:  [facility]   PHYSICIAN:  Ollen Gross, M.D.    DATE OF BIRTH:  [birth year]   DATE OF PROCEDURE:  04/11/2007  DATE OF DISCHARGE:                               OPERATIVE REPORT   PREOPERATIVE DIAGNOSIS:  Infected right knee.   POSTOPERATIVE DIAGNOSIS:  Infected right knee.   PROCEDURE:  Right knee irrigation debridement.   SURGEON:  Ollen Gross, M.D.   ASSISTANT:  Avel Peace PA-C   ANESTHESIA:  General.   ESTIMATED BLOOD LOSS:  Minimal.   DRAIN:  Hemovac times one.   TOURNIQUET TIME:  Approximately 20 minutes at 300 mmHg.   COMPLICATIONS:  None.  Condition stable to recovery.   BRIEF CLINICAL NOTE:  [name] is 77 year old female with long and  complicated history regards to her right knee.  She had multiple  procedures with revision total knee done over 5 years ago.  She has  developed significant pain and had swelling.  She had aspiration and it  showed infection.  She had been on p.o. Keflex.  Unfortunately it has  gotten worse.  She was in the hospital for hemiarthroplasty of the  shoulder for a fracture and given that she is in and the knee is  worsening, we opted for irrigation debridement today.   PROCEDURE IN DETAIL:  After successful administration of general  anesthetic, tourniquet was placed high on the right thigh, right lower  extremity prepped and draped in usual sterile fashion.  We raised the  extremity but did not wrap it in Esmarch.  Tourniquet was inflated to  300 mmHg.  Midline incision made with a 10 blade through subcutaneous  tissue to the extensor mechanism.  She had a small hole with erosion  through the proximal medial tibial soft tissue.  We debrided all this to  healthy-appearing tissue.  The soft tissue of the proximal tibia then  subperiosteally elevated around the  joint line with a knife.  Any  abnormal-appearing scar tissue was excised from the joint.  There is a  fibrinous nodular present around the previous patella and this was also  removed.  Once adequate soft tissue debridement is performed and we  thoroughly irrigated the joint with 3 liters with saline solution using  pulsatile lavage.  It was once again inspected and tissue had healthy  appearance.  The components appeared to be well-fixed with no  significant lysis around the prosthetic bone junctions.  The arthrotomy  was then closed over Hemovac drain with interrupted #1 PDS.  Tourniquet  was then released with total time about 20 minutes.  Subcu  was closed then with interrupted 2-0 Vicryl and skin with staples.  Drain hooked to suction.  Incisions cleaned and dried and bulky sterile  dressing applied.  She is then placed into a knee immobilizer, awakened  and transferred to recovery in stable condition.      Ollen Gross, M.D.  Electronically Signed  FA/MEDQ  D:  04/11/2007  T:  04/12/2007  Job:  [id number]

## 2011-05-06 NOTE — Consult Note (Signed)
Sharon, Jackson                 ACCOUNT NO.:  [id number]   MEDICAL RECORD NO.:  [id number]          PATIENT TYPE:  INP   LOCATION:  0098                         FACILITY:  [facility]   PHYSICIAN:  Mobolaji B. Bakare, M.D.DATE OF BIRTH:  02/11/1931   DATE OF CONSULTATION:  DATE OF DISCHARGE:                                 CONSULTATION   PRIMARY CARE PHYSICIAN:  Dr. [name].   REFERRING PHYSICIAN:  Ollen Gross, M.D.   REASON FOR CONSULTATION:  Leukocytosis and postoperative medical  management.   HISTORY OF PRESENTING COMPLAINT:  [name] is a 77 year old Caucasian  female who underwent right shoulder hemiarthroplasty today.  She  sustained a fall and injured her right shoulder on the night of April  2008.  She was brought in today for semi-elective surgery.  She had  blood work done yesterday which showed leukocytosis of 31,000.  The  patient has not been having any fever.  Of note, is that she started  having discharging sinus from right knee way back in January and this  was managed in the outpatient setting.  She has had hemiarthroplasty  twice on her right knee.  The first time was in 1986.  Thirteen years  later, she had another hemiarthroplasty.  She has been having some pain  and swelling on her right knee and difficulty with ambulation.   REVIEW OF SYSTEMS:  She did not have cough prior to hospitalization and  no fever or chills.  No dysuria, urgency, or increased frequency of  micturition.  The patient denies any nausea, vomiting, or diarrhea.  Present complaint is pain from surgical site.   PAST MEDICAL HISTORY:  1. Hypertrophic obstructive cardiomyopathy.  She had septal myomectomy      in August 2006.  2. She has a pacemaker for complete heart block.  3. Mild asthma.  4. Hypertension.  5. The patient has a double kidney.  6. Mitral valve insufficiency with murmur.  7. Cardiac catheterization in February 2006, showed no significant      disease.   PAST  SURGICAL HISTORY:  1. Right total knee replacement/arthroplasty in 1986, with revision of      polyethylene liner in April 1999.  2. Left total knee arthroplasty x2.  3. Bone grafting right leg in 1972.  4. Right knee osteotomy in 1976.  5. Hysterectomy in 1977.  6. Septal myomectomy at [facility] for hypertrophic obstructive      cardiomyopathy in 2006.  7. Colonoscopy in November 2004.  8. Left breast biopsy in October 2007.  9. Broken left wrist, treated by Dr. [name].   CURRENT MEDICATIONS:  1. Oxycodone 5/325, one to two every four to six hours p.r.n.  2. Promethazine 25 mg p.o. q.6 h. p.r.n.  3. Tramadol/APAP 37.5/325 p.o. one to two q.6 h. p.r.n.  4. Toprol XL 50 mg, 1-1/2 tablet p.o. daily.  5. Meloxicam 15 mg p.o. daily.  6. Cephalexin 500 mg three times a day.  7. Docusate sodium 250 mg daily.  8. Multivitamin one p.o. daily.  9. Desonide cream 0.05% to sternum for itching  p.r.n.   ALLERGIES:  1. SULFA WITH HEADACHE.  2. CODEINE CAUSES NAUSEA AND VOMITING.   FAMILY HISTORY:  The patient's mother died at the age of 68 from  pancreatic cancer.  She had heart disease and hypertension.  Father died  at the age of 74 from pancreatic cancer and emphysema.  Mother had  atrial fibrillation, hypertension.  The patient's aunt had pancreatic  cancer as well.   SOCIAL HISTORY:  She never smoked cigarettes.  She has five children.   CURRENT VITAL SIGNS:  Blood pressure 100/45, pulse of 80, respiratory  rate of 19, O2 sat of 96%.   PHYSICAL EXAMINATION:  GENERAL:  The patient is awake, alert, oriented  in time, place, and person.  HEENT: Head:  Normocephalic, atraumatic head.  Pupils equal, round, and  reactive to light.  Extraocular muscle movement intact.  Mucous  membranes moist.  NECK:  No carotid bruit.  No elevated JVD.  LUNGS:  Clear clinically to auscultation anteriorly.  Diminished air  entry in the lung bases.  CV:  S1 S2.  A systolic murmur.  ABDOMEN:  Not  distended, soft, nontender.  Bowel sounds present.  EXTREMITIES:  No pedal edema or calf tenderness.  Dorsalis pedis pulses  palpable.  MUSCULOSKELETAL:  She has a swollen right knee with slightly increased  warmth.  There is a discharging sinus just below the knee with  expressible discharge which was sent for culture.  This is along the  incision site of previous surgery.  CNS:  No focal neurological deficit.   LABORATORY DATA:  White cell count 30,000, hemoglobin 11.1, hematocrit  32.4, platelets 317, and no differential is available.  Urine microscopy  showed many squamous cells, white cells 0-2.  Urinalysis cloudy in  appearance, specific gravity 1.024, small leukocytes, negative for  nitrite.  PTT 37, PT/INR 14.6/1.7.  Sodium 138, potassium 4.3, chloride  102, CO2 23, glucose 134, BUN 21, creatinine 0.98.  Bilirubin 1.2.  Alkaline phosphatase 73, AST 27, ALT 14, total protein 6.7, albumin 3.3,  calcium 9.4.  Chest x-ray shows mild cardiac enlargement without heart  failure.   EKG showed an electronically paced rhythm.   ASSESSMENT/PLAN:  Ms. [name] is a 77 year old Caucasian female with  multiple knee surgeries.  She had a fall and sustained impacted right  humeral fracture on the night of April.  She has undergone a right  hemiarthroplasty.  We are being consulted for leukocytosis and  management of medical problems.   IMPRESSION:  1. Discharging right knee sinus.  Wound culture has been sent.  The      patient has only received Ancef preoperatively.  The leukocytosis      is more than likely from an infected knee prosthesis.  I agree with      vancomycin until cultures are available.  We will defer further      management to orthopedic.  Consideration will be given to      infectious disease consult and obtaining of an CT of the knee, if      agreeable. 2. Leukocytosis likely secondary to number one.  However, we will      check a urine culture.  Chest x-ray showed no  acute disease.  3. Hypertrophic cardiomyopathy, status post septal myomectomy.  This      is apparently stable.  4. Mild asthma.  This is stable.  5. Hypertension.  This is controlled.  We will continue with home  medications.  6. Comminuted and impacted fracture of the right humerus, status post      right hemiarthroplasty.      Mobolaji B. Corky Downs, M.D.  Electronically Signed     MBB/MEDQ  D:  04/06/2007  T:  04/06/2007  Job:  [id number]   cc:   Ollen Gross, M.D.  Fax: [phone]   [name], Dr.  [facility]

## 2011-05-06 NOTE — Discharge Summary (Signed)
[facility]  Patient:    [name], [name] Visit Number: [id number] MRN: [id number]          Service Type: SUR Location: 4W [id number] Attending Physician:  Loanne Drilling Dictated by:   Alexzandrew L. Perkins, P.A.-C. Admit Date:  10/29/2001 Discharge Date: 11/02/2001                             Discharge Summary  ADMITTING DIAGNOSES: 1. Loosened tibial component, right total knee. 2. Status post right total knee replacement arthroplasty. 3. History of hypotrophic cardiomyopathy. 4. Hypertension. 5. History of anemia. 6. Mitral valve insufficiency murmur.  DISCHARGE DIAGNOSES: 1. Loosened component, right total knee, status post revision of right total    knee replacement arthroplasty. 2. Positive fluid balance volume overload, improved. 3. Postoperative hypokalemia, improved. 4. History of hypotrophic cardiomyopathy. 5. Hypertension. 6. History of anemia. 7. Mitral valve insufficiency murmur.  PROCEDURE:  Patient was taken to the operating room on October 29, 2001, underwent revision of the right total knee replacement arthroplasty and also left plantar fascial injection.  SURGEON:  Ollen Gross, M.D.  CONSULTS:  None.  HISTORY OF PRESENT ILLNESS:  Patient is a 77 year old female with a history of right total knee.  She continued to have a considerable amount of pain in the right pretibial area.  Bone scan was ordered and suggested possible loosening of the tibial component.  Pain has been relatively unresponsive to p.o. medications and she is at the point where she wants to have something done about it.  Risks and benefits have been discussed and she accepts these and decides on revision surgery.  LABORATORY DATA:  CBC on admission:  Hemoglobin 12.9, hematocrit 37.1, white cell count 5.8, red cell count 4.02.  Serial H&Hs were followed throughout the hospital course.  Postoperative hemoglobin down to 12.1 and 35.3.  Last noted  H&H was 11.1 and 33.1.  PT, PTT on admission were 13.5 and 33 respectively with an INR of 1.0.  Serial pro times were followed per Coumadin protocol.  Last noted PT, INR were 18.9 and 1.8 respectively.  Chemistry panel on admission all within normal limits.  Serial BMETs were followed throughout the hospital course.  Potassium did drop down from a level of 4.5 down to a level of 3.2.  This was noted on October 31, 2001.  Patient was treated with potassium supplements.  Potassium came back up and last noted potassium was back up to 4.3 prior to discharge.  Urinalysis on admission was negative. Blood group type O positive.  EKG dated October 22, 2001, marked sinus bradycardia, left atrial enlargement, left ventricular hypertrophy with repolarization abnormality.  No old tracings to compare, confirmed by Dr. Peter Sw[place]il[place].  Chest x-ray on October 22, 2001, no active disease.  Knee films taken on October 29, 2001, showed postoperative changes, no ______ alignment of the femoral and tibial components thought to be satisfactory.  Tibial component appears to be shortened with moderate methyl methacrylate injected into the intramedullary regions.  Portable chest taken October 31, 2001, no evidence of infiltrate, significant atelectasis, pleural fluid, or edema.  Heart size stable, no active disease.  HOSPITAL COURSE:  Patient was admitted to the [facility], taken to the operating room, underwent the above-stated procedure without complication. Patient tolerated the procedure well, was later transferred to the recovery room and then to the orthopedic floor for continued postoperative care.  Vital signs were followed.  Is and Os were followed very closely.  Patient was noted to have positive fluid balance.  She underwent some mild diuresis.  Her positive fluid balance is improved.  She was also noted to have a drop in her potassium throughout the hospital course.  Patient as placed on  K-Dur by mouth.  Her potassium improved.  A Hemovac drain which was placed at the time of surgery was pulled on postoperative day #1.  Patient was weightbearing as tolerated.  The oxygen per nasal cannula which was placed at the time of surgery was also discontinued on postoperative day #1.  Physical therapy was consulted to assist with gait training, ambulation.  Patient did very well with physical therapy, was up ambulating greater than 100 feet by postoperative day #2 and had increased greater than 200 feet by postoperative day #3.  Dressing changes were initiated on postoperative day #2.  Wound was healing well.  She did have some nasal congestion.  Her lungs were clear. Saline nasal sprays were added.  Patient was noted to have a blowing systolic murmur.  However, this was due to her mitral valve insufficiency that was also noted preoperatively.  Patient continued to improve utilizing the CPM for a range of motion and therapy for gait training ambulation up until postoperative day #4 when she was doing quite well and her INR was increasing. She was independent with ambulation and decided to be discharged home.  DISCHARGE PLAN: 1. Patient is discharged home on November 02, 2001. 2. Discharge diagnoses same as above. 3. Discharge medications:    a. Colace 100 mg p.o. b.i.d.    b. Coumadin as per pharmacy protocol.    c. Percocet p.r.n. pain.    d. Robaxin p.r.n. spasm. 4. Diet:  Low sodium diet. 5. Activity:  Weightbearing as tolerated.  Home health physical therapy and    home health nursing for continued gait training, ambulation, and pro time    draws through [facility].  She may start to shower following    discharge. 6. Follow-up:  Next Thursday or Friday following discharge.  CONDITION UPON DISCHARGE:  Improved. Dictated by:   Alexzandrew L. Perkins, P.A.-C. Attending Physician:  Loanne Drilling DD:  12/10/01 TD:  12/10/01 Job: [id number]

## 2011-05-06 NOTE — Discharge Summary (Signed)
[name], [name]                 ACCOUNT NO.:  [id number]   MEDICAL RECORD NO.:  [id number]          PATIENT TYPE:  INP   LOCATION:  1505                         FACILITY:  [facility]   PHYSICIAN:  Ollen Gross, M.D.    DATE OF BIRTH:  Sep 20, 1931   DATE OF ADMISSION:  04/06/2007  DATE OF DISCHARGE:  04/16/2007                               DISCHARGE SUMMARY   ADMITTING DIAGNOSES:  1. Right proximal humerus fracture.  2. Chronically infected right total knee  3. History of hypertrophic cardiomyopathy  4. Hypertension  5. Anemia  6. History of mitral valve insufficiency murmur/regurgitation.  7. Mild asthma.  8. History of complete heart block requiring a pacer   DISCHARGE DIAGNOSIS:  1. Right proximal humerus fracture with OA, status post right shoulder      hemiarthroplasty.  2. Chronically infected right total knee status post irrigation,      debridement right knee.  3. Postop blood loss anemia.  4. Status post transfusion without sequelae  5. Postoperative hyponatremia, improved.  6. History of hypertrophic cardiomyopathy  7. Hypertension  8. Anemia  9. History of mitral valve insufficiency murmur/regurgitation.  10.Mild asthma.  11.History of complete heart block requiring a pacer   PROCEDURE:  1. 04/06/2007 right shoulder hemiarthroplasty.  Surgeon Dr. [name],      assistant Avel Peace PA-C.  Anesthesia general.  2. 04/11/2007 infected right knee status post irrigation and      debridement right knee.  Surgeon Dr. [name], assistant Avel Peace PA-C.  Anesthesia general.   CONSULTS:  Medical consult, Dr. Corky Downs.   BRIEF HISTORY:  The patient is a 77 year old female with a longstanding  history osteoarthritis in the shoulders. She has undergone previous  injections. Unfortunately she had a fall about a week prior to her  admission sustaining a proximal humerus fracture with the placement  combination. It was best decided since she has already underlying  arthritis and fractures, she would benefit from undergoing  hemiarthroplasty.  Risks and benefits discussed and she was subsequently  admitted to the hospital.   LABORATORY DATA:  Preop CBC showed hemoglobin 12.7, hematocrit of  37.1,  white cell count 31.6, red cell count 3.7.  Postop hemoglobin 11, white  count was still elevated at 30, hemoglobin drifted down to 8.6 and got  as low as 8.1, white count came down to normal level of 10.  She was  given blood, post transfusion hemoglobin back up to 10.7,  last H&H 9.6  and 27.8.  The differential on the admission CBC showed elevated  neutrophils of 87, lymphs 6,  mono's 6, eosinophil 0.  The follow-up  differential on her CBC taken 04/11/2007 showed neutrophils came down to  normal level 77.  PT/PTT on admission 14.6, 37 respectively.  INR 1.1.  Follow-up protime INR 17.6 and 1.4.  Chem panel on admission low albumin  of 3.3, slightly elevated glucose at 134.  Remaining Chem panel within  normal limits.  Serial BMETs were followed.  Sodium did drop down to  133, came back up to 135,  back down to 132.  Vanc troughs taken twice on  04/21 and 04/26 showed 8.4, 10.4 respectively.  Preop UA cloudy, small  hemoglobin, trace ketones, positive proteins, small leukocyte esterase,  many epithelials 0-2 white cells.  Follow-up UA on 04/19, trace  leukocyte esterase, otherwise negative.  Blood group type O+.  Urine  culture from the second  urinalysis was negative.  No growth.  Wound  culture taken on 04/18 from the right knee during hospitalization showed  rare WBCs, moderate gram positive cocci in the smear and moderate  enterococcus species grown out.  Tissue culture taken on 04/11/2007 from  the knee, abundant enterococcus species.  Wound culture x2, the second  one on  04/11/2007 showed again abundant enterococcus species.  Anaerobic cultures, no anaerobes isolated x2 but the smears did confirm  positive gram-positive cocci.   EKG 04/05/2007,  electronic ventricular pacer, sinus tachycardia, left  bundle branch block confirmed by Dr. Orpah Cobb.  Chest X-Ray:  04/05/2007 mild cardiac enlargement without heart failure.  Knee films  04/10/2007, fixed right knee arthroplasty, loosely around the femur  intramedullary stem, also osteolysis of the femur proximal to the  prosthetic condyle.  Portable chest 04/12/2007, left upper extremity  PICC to the SVC about 1.4 cm above the atrial junction, new right  shoulder hemiarthroplasty, no active disease.   HOSPITAL COURSE:  The patient admitted to [facility] for the  above-stated shoulder fracture, taken to OR, underwent the said  procedure without complication.  The patient tolerated the procedure  well, later transferred to recovery room on orthopedic floor.  Dr.  Corky Downs from [facility] hospitalist was consulted to assist with postop  management of this patient due to her extensive history.  She is known  to have a cardiomyopathy which was stable, asthma which was stable.  Dr.  Corky Downs followed her during the first part of the hospital course.  Did  have some elevated white count. She was doing pretty well on postop day  #1.  She was weaned over to p.o. meds.  She was taking vancomycin which  was given to her postoperatively.  The urine culture was negative. It  was concerned that the leukocytosis was coming from her chronically  infected knee.  It was noted that she was having a little bit of  drainage out of one of the wound sites.  By day two the shoulder was  doing better, she was having a minimal amount of pain.  Unfortunately,  her blood count had dropped postoperatively down to a level 8.1. Due to  the low level it was felt she would benefit from undergoing a  transfusion.  She was given blood and tolerated the blood well. One of  the cultures was taken from the knee and it showed abundant enterococcus species.  By day three due to the knee pain and chronic infection,  it  was felt that the patient should undergo an I&D of the knee in the near  future.  Her hemoglobin had come back up to 10.7 after the 2 units.  Continued on IV vancomycin.  By day four from her standpoint from her  shoulder she was doing fairly well, but the knee was causing her more  pain and the drain had increased.  Right shoulder was healing well. A  small opening which was at the knee which was medial proximal tibia was  draining fairly purulent fluid.  Due to this we rechecked her labs,  continued on vancomycin protocol  and scheduled her for surgery.  She was  preopped and taken to the OR on 04/11/2007 and underwent the above  procedure #2 for the washout.  She tolerated procedure well, later  transferred back to recovery room and then back to the orthopedic floor.  She continued on the vancomycin and p.o. IV analgesics. Her white count  postop from day one of the I&D had come back down.  She was back down to  7.3 on her white count.  Hemoglobin was stable. The shoulder which she  was out by day six was doing very well.  All the cultures did show gram-  positive rods and gram-positive cocci.  Cultures were followed. Two of  the tissue cultures did grow out again enterococcus which was confirmed  from the preop culture.  She remained on her vancomycin due to chronic  infection.  She was given a PICC line.  Chest x-ray was ordered post  PICC just to confirm placement.  She actually did improve after the I&D  and washout.  She was feeling better.  The shoulder incision was healing  very well.  Dressing change was started on postop day 2. We changed the  dressing on postop day of the shoulder but as far as the knee, the I&D  we changed it about 2 days later after that procedure note and it looked  very good, much better than the preop appearance.  She remained on the  PICC line, continue to be followed for several more days in the  hospital.  She was slowly progressing with her  therapy.  She started  getting up, doing a little bit more walking, and by 04/28 she was  tolerating her p.m. medications, up and ambulatory and wanting to go  home.   DISCHARGE/PLAN:  1. The patient was discharged home on 04/16/2007.  2. Discharge diagnoses,  please see above.  3. Discharge meds.  She is placed on the IV vancomycin, was also given      Percocet and Robaxin.  4. Follow up approximately 7-10 days in the office, contact the office      for an appointment.   ACTIVITY:  Weightbearing as tolerate to the right lower extremity.  Home  health PT and home health OT for ambulation and also therapy with  regards to the right shoulder hemiarthroplasty.   DIET:  Low sodium diet, cardiac diet.   DISPOSITION:  Home.   CONDITION ON DISCHARGE:  Slowly improving.      Alexzandrew L. Perkins, P.A.C.      Ollen Gross, M.D.  Electronically Signed   ALP/MEDQ  D:  06/14/2007  T:  06/14/2007  Job:  [id number]   cc:   Ollen Gross, M.D.  Fax: [phone]   Associates Dr. [name], [facility]

## 2011-05-06 NOTE — H&P (Signed)
[name], [name]                 ACCOUNT NO.:  [id number]   MEDICAL RECORD NO.:  [id number]          PATIENT TYPE:  LINP   LOCATION:                               FACILITY:  [facility]   PHYSICIAN:  Ollen Gross, M.D.    DATE OF BIRTH:  1931-04-02   DATE OF ADMISSION:  04/06/2007  DATE OF DISCHARGE:                              HISTORY & PHYSICAL   CHIEF COMPLAINT:  Right shoulder fracture.   HISTORY OF PRESENT ILLNESS:  The patient is a 77 year old female who has  been seen by Dr. [name] who, unfortunately, had a fall back on March 28, 2007 injuring her right shoulder.  She was seen at [facility] emergency  department where x-rays and CTs were done.  The x-rays demonstrated  proximal humerus fracture with impaction in the humeral head with what  appeared to be a coronal fracture also.  It appeared to be dislocated  until the CT scan was done.  CT itself demonstrated that shoulder was  located, but there was a surgical anatomic neck fracture with impaction.  Due to these significant findings she was referred over to Dr. Ollen Gross, and it is felt she would best be served by undergoing surgical  intervention of this proximal humerus fracture.  Risks and benefits have  been discussed.  She elects and wishes for Dr. [name] to proceed with  surgery.   ALLERGIES:  No known drug allergies.   INTOLERANCES:  CODEINE causes nausea and vomiting.   PAST MEDICAL HISTORY:  Hypertrophic cardiomyopathy, history of complete  heart block post surgical procedure requiring pacer, mild mitral  regurgitation, hypertension, anemia, mitral valve insufficiency, mild  asthma.   PAST SURGICAL HISTORY:  Myotomy, pacemaker placement, bilateral total  knee replacement arthroplasties x2, right femur fracture surgery, right  wrist fracture surgery, right shoulder surgery, left breast biopsy  negative, and colonoscopy.   SOCIAL HISTORY:  Married, retired, nonsmoker.  No alcohol.  Two  children.   Husband will be assisting with care after surgery.   FAMILY HISTORY:  Significant for heart disease, hypertension.  Also both  parents had pancreatic cancer.   REVIEW OF SYSTEMS:  GENERAL:  No fevers, chills, night sweats.  NEUROLOGICAL:  No seizures, syncope or paralysis.  RESPIRATORY:  No  shortness of breath, cough or hemoptysis.  CARDIOVASCULAR:  History of  cardiomyopathy and complete heart block requiring pacer. No chest pain,  angina, or orthopnea.  GI:  No nausea, vomiting, diarrhea or  constipation.  GU:  No dysuria, hematuria or discharge.  MUSCULOSKELETAL:  Right shoulder.   PHYSICAL EXAMINATION:  VITAL SIGNS:  Pulse 76, respirations 14, blood  pressure 90/62.  GENERAL:  A 77 year old white female well-nourished, well-developed, in  no acute distress.  She is alert, oriented, and cooperative, adverse  historian.  The right arm is in a sling mobilizer. HEENT:  Normocephalic, atraumatic.  Pupils are round and reactive.  Oropharynx  clear.  EOMs intact.  She does have upper partial plate.  NECK:  Supple.  CHEST:  Clear anterior posterior chest walls.  HEART:  Regular rate  and rhythm.  She does have a pansystolic ejection  murmur best heard along the left sternal border.  ABDOMEN:  Soft, nontender.  Bowel sounds present.  RECTAL, BREASTS AND GENITALIA:  Not done, not pertinent to present  illness. EXTREMITIES:  Right upper extremity:  Motor function is intact,  moving hands and fingers well.  She does have this right arm in a  shoulder immobilizer; sensation is intact.   IMPRESSION:  1. Right proximal humerus fracture.  2. History of hypertrophic cardiomyopathy.  3. Hypertension.  4. Anemia.  5. History of mitral valve insufficiency murmur/regurgitation.  6. Mild asthma.   PLAN:  The patient admitted to [facility] to undergo a right  shoulder hemiarthroplasty. Surgery will be performed by Dr. Ollen Gross.      Alexzandrew L. Perkins, P.A.C.       Ollen Gross, M.D.  Electronically Signed    ALP/MEDQ  D:  04/05/2007  T:  04/06/2007  Job:  [id number]   cc:   Dr. [name]  [facility]   Ollen Gross, M.D.  Fax: [phone]

## 2011-05-14 ENCOUNTER — Encounter: Payer: Self-pay | Admitting: Internal Medicine

## 2011-05-27 ENCOUNTER — Encounter: Payer: Medicare Other | Admitting: Internal Medicine

## 2011-06-15 ENCOUNTER — Encounter (HOSPITAL_BASED_OUTPATIENT_CLINIC_OR_DEPARTMENT_OTHER): Payer: Medicare Other | Admitting: Oncology

## 2011-06-15 ENCOUNTER — Other Ambulatory Visit: Payer: Self-pay | Admitting: Oncology

## 2011-06-15 DIAGNOSIS — D0590 Unspecified type of carcinoma in situ of unspecified breast: Secondary | ICD-10-CM

## 2011-06-15 LAB — CBC WITH DIFFERENTIAL/PLATELET
BASO%: 0.3 % (ref 0.0–2.0)
Basophils Absolute: 0 10*3/uL (ref 0.0–0.1)
EOS%: 1.8 % (ref 0.0–7.0)
Eosinophils Absolute: 0.1 10*3/uL (ref 0.0–0.5)
HCT: 34.8 % (ref 34.8–46.6)
HGB: 11.8 g/dL (ref 11.6–15.9)
LYMPH%: 13.8 % — ABNORMAL LOW (ref 14.0–49.7)
MCH: 33.2 pg (ref 25.1–34.0)
MCHC: 34 g/dL (ref 31.5–36.0)
MCV: 97.6 fL (ref 79.5–101.0)
MONO#: 0.6 10*3/uL (ref 0.1–0.9)
MONO%: 12.3 % (ref 0.0–14.0)
NEUT#: 3.3 10*3/uL (ref 1.5–6.5)
NEUT%: 71.8 % (ref 38.4–76.8)
Platelets: 148 10*3/uL (ref 145–400)
RBC: 3.56 10*6/uL — ABNORMAL LOW (ref 3.70–5.45)
RDW: 14.4 % (ref 11.2–14.5)
WBC: 4.5 10*3/uL (ref 3.9–10.3)
lymph#: 0.6 10*3/uL — ABNORMAL LOW (ref 0.9–3.3)

## 2011-06-15 LAB — COMPREHENSIVE METABOLIC PANEL
ALT: 15 U/L (ref 0–35)
AST: 24 U/L (ref 0–37)
Albumin: 4.1 g/dL (ref 3.5–5.2)
Alkaline Phosphatase: 56 U/L (ref 39–117)
BUN: 24 mg/dL — ABNORMAL HIGH (ref 6–23)
CO2: 28 mEq/L (ref 19–32)
Calcium: 9.2 mg/dL (ref 8.4–10.5)
Chloride: 103 mEq/L (ref 96–112)
Creatinine, Ser: 1.13 mg/dL — ABNORMAL HIGH (ref 0.50–1.10)
Glucose, Bld: 89 mg/dL (ref 70–99)
Potassium: 4.2 mEq/L (ref 3.5–5.3)
Sodium: 141 mEq/L (ref 135–145)
Total Bilirubin: 0.6 mg/dL (ref 0.3–1.2)
Total Protein: 6.1 g/dL (ref 6.0–8.3)

## 2011-06-21 ENCOUNTER — Other Ambulatory Visit: Payer: Self-pay | Admitting: Dermatology

## 2011-06-23 ENCOUNTER — Encounter: Payer: Medicare Other | Admitting: *Deleted

## 2011-06-27 ENCOUNTER — Encounter: Payer: Self-pay | Admitting: *Deleted

## 2011-07-12 ENCOUNTER — Encounter: Payer: Self-pay | Admitting: Internal Medicine

## 2011-07-12 ENCOUNTER — Ambulatory Visit (INDEPENDENT_AMBULATORY_CARE_PROVIDER_SITE_OTHER): Payer: Medicare Other | Admitting: Internal Medicine

## 2011-07-12 DIAGNOSIS — I442 Atrioventricular block, complete: Secondary | ICD-10-CM

## 2011-07-12 DIAGNOSIS — I10 Essential (primary) hypertension: Secondary | ICD-10-CM

## 2011-07-12 DIAGNOSIS — Z950 Presence of cardiac pacemaker: Secondary | ICD-10-CM

## 2011-07-12 NOTE — Assessment & Plan Note (Signed)
Her blood pressure appears to be well controlled. She will continue a low-sodium diet. She will continue her current medical therapy.

## 2011-07-12 NOTE — Patient Instructions (Addendum)
Your physician wants you to follow-up in: 12 months Dr Court Joy will receive a reminder letter in the mail two months in advance. If you don't receive a letter, please call our office to schedule the follow-up appointment.   Remote monitoring is used to monitor your Pacemaker of ICD from home. This monitoring reduces the number of office visits required to check your device to one time per year. It allows [place] to keep an eye on the functioning of your device to ensure it is working properly. You are scheduled for a device check from home on 10/13/11. You may send your transmission at any time that day. If you have a wireless device, the transmission will be sent automatically. After your physician reviews your transmission, you will receive a postcard with your next transmission date.

## 2011-07-12 NOTE — Assessment & Plan Note (Signed)
Her pacemaker is working normally. No evidence of any damage from radiation therapy. We will see her back in several months.

## 2011-07-12 NOTE — Progress Notes (Signed)
HPI Mrs. Banning returns today for followup.  Very pleasant 77 year old woman with a history of complete heart block status post permanent pacemaker insertion. She has recently finished a course of radiation therapy for breast cancer. She continues to do well. She denies chest pain, shortness of breath, or peripheral edema. No syncope. She notes that she has gained some weight. She is not exercising but she does do a lot of housework. Allergies  Allergen Reactions  . Codeine   . Morphine   . Sulfonamide Derivatives      Current Outpatient Prescriptions  Medication Sig Dispense Refill  . albuterol (PROAIR HFA) 108 (90 BASE) MCG/ACT inhaler UAD       . aspirin 81 MG EC tablet Take 81 mg by mouth daily.        . Cholecalciferol (VITAMIN D3) 2000 UNITS capsule Take 2,000 Units by mouth daily.        . cycloSPORINE (RESTASIS) 0.05 % ophthalmic emulsion 1 drop 2 (two) times daily.        . Fish Oil OIL Take 1,200 mg by mouth daily.        . meloxicam (MOBIC) 15 MG tablet Take 15 mg by mouth daily.        . metoprolol (TOPROL-XL) 100 MG 24 hr tablet Take 100 mg by mouth daily.        . Multiple Vitamin (MULTIVITAMIN) tablet Take 1 tablet by mouth daily.        . traMADol (ULTRAM-ER) 300 MG 24 hr tablet Take 900 mg by mouth 3 (three) times daily.           Past Medical History  Diagnosis Date  . Hypertension   . Mitral regurgitation   . Hypertrophic obstructive cardiomyopathy     Required septal myotomy  . Urinary incontinence   . Cystitis   . Hemorrhoids   . Bronchitis   . Asthma   . Anemia   . Heart block     Complete heart block post surgical requiring pacer  . Hypokalemia     Postoperative, improved  . Hyponatremia     Mild postoperative, improved    ROS:   All systems reviewed and negative except as noted in the HPI.   Past Surgical History  Procedure Date  . Anterior cervical diskectomy and fusion     C-7  . Septal myotomy   . Pacemaker placement   . Replacement  total knee bilateral   . Femur fracture surgery     Right  . Wrist fracture surgery     Right  . Shoulder surgery     Right  . Breast biopsy     Left, negative  . Colonoscopy   . Abdominal hysterectomy      Family History  Problem Relation Age of Onset  . Heart disease Mother   . Hypertension Mother   . Pancreatic cancer Mother   . Pancreatic cancer Father   . Diabetes Brother   . Pancreatic cancer Other   . Heart disease Brother     Enlarged heart     History   Social History  . Marital Status: Married    Spouse Name: N/A    Number of Children: N/A  . Years of Education: N/A   Occupational History  . Not on file.   Social History Main Topics  . Smoking status: Never Smoker   . Smokeless tobacco: Not on file  . Alcohol Use: No  . Drug Use: Not on file  .  Sexually Active: Not on file   Other Topics Concern  . Not on file   Social History Narrative   Bonna Gains childrenHusband will be assisting with care after surgery     BP 132/77  Pulse 90  Resp 14  Ht 5' (1.524 m)  Wt 145 lb (65.772 kg)  BMI 28.32 kg/m2  Physical Exam:  Well appearing elderly woman, NAD HEENT: Unremarkable Neck:  No JVD, no thyromegally Lymphatics:  No adenopathy Back:  No CVA tenderness Lungs:  Clear HEART:  Regular rate rhythm, no murmurs, no rubs, no clicks. S2 is split Abd:  soft, positive bowel sounds, no organomegally, no rebound, no guarding Ext:  2 plus pulses, no edema, no cyanosis, no clubbing Skin:  No rashes no nodules Neuro:  CN II through XII intact, motor grossly intact  DEVICE  Normal device function.  See PaceArt for details. Approximately 2 years of battery longevity.   Assess/Plan:

## 2011-07-18 ENCOUNTER — Other Ambulatory Visit: Payer: Self-pay | Admitting: Oncology

## 2011-07-18 ENCOUNTER — Encounter (HOSPITAL_BASED_OUTPATIENT_CLINIC_OR_DEPARTMENT_OTHER): Payer: Medicare Other

## 2011-07-18 DIAGNOSIS — D0590 Unspecified type of carcinoma in situ of unspecified breast: Secondary | ICD-10-CM

## 2011-07-18 LAB — COMPREHENSIVE METABOLIC PANEL
ALT: 13 U/L (ref 0–35)
AST: 23 U/L (ref 0–37)
Albumin: 4.2 g/dL (ref 3.5–5.2)
Alkaline Phosphatase: 69 U/L (ref 39–117)
BUN: 22 mg/dL (ref 6–23)
CO2: 24 mEq/L (ref 19–32)
Calcium: 9.6 mg/dL (ref 8.4–10.5)
Chloride: 104 mEq/L (ref 96–112)
Creatinine, Ser: 1.24 mg/dL — ABNORMAL HIGH (ref 0.50–1.10)
Glucose, Bld: 98 mg/dL (ref 70–99)
Potassium: 4.4 mEq/L (ref 3.5–5.3)
Sodium: 140 mEq/L (ref 135–145)
Total Bilirubin: 0.5 mg/dL (ref 0.3–1.2)
Total Protein: 6.7 g/dL (ref 6.0–8.3)

## 2011-07-18 LAB — CBC WITH DIFFERENTIAL (CANCER CENTER ONLY)
BASO#: 0 10*3/uL (ref 0.0–0.2)
BASO%: 0.7 % (ref 0.0–2.0)
EOS%: 1.7 % (ref 0.0–7.0)
Eosinophils Absolute: 0.1 10*3/uL (ref 0.0–0.5)
HCT: 37.6 % (ref 34.8–46.6)
HGB: 13.1 g/dL (ref 11.6–15.9)
LYMPH#: 0.7 10*3/uL — ABNORMAL LOW (ref 0.9–3.3)
LYMPH%: 11.1 % — ABNORMAL LOW (ref 14.0–48.0)
MCH: 33.7 pg (ref 26.0–34.0)
MCHC: 34.8 g/dL (ref 32.0–36.0)
MCV: 97 fL (ref 81–101)
MONO#: 1 10*3/uL — ABNORMAL HIGH (ref 0.1–0.9)
MONO%: 16.8 % — ABNORMAL HIGH (ref 0.0–13.0)
NEUT#: 4.2 10*3/uL (ref 1.5–6.5)
NEUT%: 69.7 % (ref 39.6–80.0)
Platelets: 159 10*3/uL (ref 145–400)
RBC: 3.89 10*6/uL (ref 3.70–5.32)
RDW: 13.4 % (ref 11.1–15.7)
WBC: 6 10*3/uL (ref 3.9–10.0)

## 2011-07-22 ENCOUNTER — Ambulatory Visit
Admission: RE | Admit: 2011-07-22 | Discharge: 2011-07-22 | Disposition: A | Discharge status: Home / Self Care | Payer: Medicare Other | Source: Ambulatory Visit | Attending: Radiation Oncology | Admitting: Radiation Oncology

## 2011-09-16 LAB — CBC
HCT: 34.7 — ABNORMAL LOW
HCT: 37.9
Hemoglobin: 11.8 — ABNORMAL LOW
Hemoglobin: 12.9
MCHC: 33.9
MCHC: 34.1
MCV: 99.3
MCV: 99.7
Platelets: 165
Platelets: 212
RBC: 3.48 — ABNORMAL LOW
RBC: 3.82 — ABNORMAL LOW
RDW: 13.3
RDW: 13.7
WBC: 5.6
WBC: 6.6

## 2011-09-16 LAB — BASIC METABOLIC PANEL
BUN: 10
BUN: 20
CO2: 28
CO2: 30
Calcium: 9
Calcium: 9.6
Chloride: 103
Chloride: 105
Creatinine, Ser: 0.93
Creatinine, Ser: 0.93
GFR calc Af Amer: >60
GFR calc Af Amer: >60
GFR calc non Af Amer: 59 — ABNORMAL LOW
GFR calc non Af Amer: 59 — ABNORMAL LOW
Glucose, Bld: 148 — ABNORMAL HIGH
Glucose, Bld: 95
Potassium: 4.5
Potassium: 4.6
Sodium: 138
Sodium: 140

## 2011-09-26 ENCOUNTER — Encounter (HOSPITAL_BASED_OUTPATIENT_CLINIC_OR_DEPARTMENT_OTHER): Payer: Medicare Other | Admitting: Oncology

## 2011-09-26 ENCOUNTER — Other Ambulatory Visit: Payer: Self-pay | Admitting: Oncology

## 2011-09-26 DIAGNOSIS — D0590 Unspecified type of carcinoma in situ of unspecified breast: Secondary | ICD-10-CM

## 2011-09-26 DIAGNOSIS — E559 Vitamin D deficiency, unspecified: Secondary | ICD-10-CM

## 2011-09-26 DIAGNOSIS — C50419 Malignant neoplasm of upper-outer quadrant of unspecified female breast: Secondary | ICD-10-CM

## 2011-09-26 LAB — COMPREHENSIVE METABOLIC PANEL
ALT: 10 U/L (ref 0–35)
AST: 20 U/L (ref 0–37)
Albumin: 4.1 g/dL (ref 3.5–5.2)
Alkaline Phosphatase: 64 U/L (ref 39–117)
BUN: 21 mg/dL (ref 6–23)
CO2: 26 mEq/L (ref 19–32)
Calcium: 9.3 mg/dL (ref 8.4–10.5)
Chloride: 104 mEq/L (ref 96–112)
Creatinine, Ser: 1.01 mg/dL (ref 0.50–1.10)
Glucose, Bld: 93 mg/dL (ref 70–99)
Potassium: 4.2 mEq/L (ref 3.5–5.3)
Sodium: 142 mEq/L (ref 135–145)
Total Bilirubin: 0.6 mg/dL (ref 0.3–1.2)
Total Protein: 6.2 g/dL (ref 6.0–8.3)

## 2011-09-26 LAB — CBC WITH DIFFERENTIAL/PLATELET
BASO%: 0.2 % (ref 0.0–2.0)
Basophils Absolute: 0 10*3/uL (ref 0.0–0.1)
EOS%: 1.6 % (ref 0.0–7.0)
Eosinophils Absolute: 0.1 10*3/uL (ref 0.0–0.5)
HCT: 35.1 % (ref 34.8–46.6)
HGB: 12.1 g/dL (ref 11.6–15.9)
LYMPH%: 12.4 % — ABNORMAL LOW (ref 14.0–49.7)
MCH: 34.1 pg — ABNORMAL HIGH (ref 25.1–34.0)
MCHC: 34.4 g/dL (ref 31.5–36.0)
MCV: 99.1 fL (ref 79.5–101.0)
MONO#: 0.6 10*3/uL (ref 0.1–0.9)
MONO%: 12.2 % (ref 0.0–14.0)
NEUT#: 3.9 10*3/uL (ref 1.5–6.5)
NEUT%: 73.6 % (ref 38.4–76.8)
Platelets: 140 10*3/uL — ABNORMAL LOW (ref 145–400)
RBC: 3.54 10*6/uL — ABNORMAL LOW (ref 3.70–5.45)
RDW: 13.6 % (ref 11.2–14.5)
WBC: 5.3 10*3/uL (ref 3.9–10.3)
lymph#: 0.7 10*3/uL — ABNORMAL LOW (ref 0.9–3.3)

## 2011-10-03 LAB — BASIC METABOLIC PANEL
BUN: 4 — ABNORMAL LOW
BUN: 5 — ABNORMAL LOW
BUN: 5 — ABNORMAL LOW
BUN: 5 — ABNORMAL LOW
CO2: 29
CO2: 29
CO2: 30
CO2: 34 — ABNORMAL HIGH
Calcium: 8.1 — ABNORMAL LOW
Calcium: 8.3 — ABNORMAL LOW
Calcium: 8.4
Calcium: 8.9
Chloride: 100
Chloride: 101
Chloride: 101
Chloride: 103
Creatinine, Ser: 0.59
Creatinine, Ser: 0.61
Creatinine, Ser: 0.62
Creatinine, Ser: 0.62
GFR calc Af Amer: >60
GFR calc Af Amer: >60
GFR calc Af Amer: >60
GFR calc Af Amer: >60
GFR calc non Af Amer: >60
GFR calc non Af Amer: >60
GFR calc non Af Amer: >60
GFR calc non Af Amer: >60
Glucose, Bld: 100 — ABNORMAL HIGH
Glucose, Bld: 109 — ABNORMAL HIGH
Glucose, Bld: 133 — ABNORMAL HIGH
Glucose, Bld: 87
Potassium: 3.4 — ABNORMAL LOW
Potassium: 3.5
Potassium: 3.7
Potassium: 4.7
Sodium: 134 — ABNORMAL LOW
Sodium: 136
Sodium: 138
Sodium: 140

## 2011-10-03 LAB — TISSUE CULTURE: Culture: NO GROWTH

## 2011-10-03 LAB — PROTIME-INR
INR: 1.1
INR: 1.2
INR: 1.4
INR: 1.4
INR: 1.7 — ABNORMAL HIGH
Prothrombin Time: 13.9
Prothrombin Time: 15.4 — ABNORMAL HIGH
Prothrombin Time: 17.1 — ABNORMAL HIGH
Prothrombin Time: 17.4 — ABNORMAL HIGH
Prothrombin Time: 20.7 — ABNORMAL HIGH

## 2011-10-03 LAB — ANAEROBIC CULTURE

## 2011-10-03 LAB — GRAM STAIN

## 2011-10-03 LAB — CBC
HCT: 22.8 — ABNORMAL LOW
HCT: 23.9 — ABNORMAL LOW
HCT: 30 — ABNORMAL LOW
HCT: 37.3
Hemoglobin: 10.5 — ABNORMAL LOW
Hemoglobin: 12.6
Hemoglobin: 8 — ABNORMAL LOW
Hemoglobin: 8.4 — ABNORMAL LOW
MCHC: 33.6
MCHC: 34.8
MCHC: 34.9
MCHC: 35
MCV: 89.3
MCV: 89.9
MCV: 89.9
MCV: 90.8
Platelets: 150
Platelets: 152
Platelets: 177
Platelets: 278
RBC: 2.54 — ABNORMAL LOW
RBC: 2.66 — ABNORMAL LOW
RBC: 3.36 — ABNORMAL LOW
RBC: 4.11
RDW: 15.1 — ABNORMAL HIGH
RDW: 15.2 — ABNORMAL HIGH
RDW: 15.3 — ABNORMAL HIGH
RDW: 15.6 — ABNORMAL HIGH
WBC: 5
WBC: 6.2
WBC: 6.7
WBC: 6.8

## 2011-10-03 LAB — URINALYSIS, ROUTINE W REFLEX MICROSCOPIC
Bilirubin Urine: NEGATIVE
Glucose, UA: NEGATIVE
Hgb urine dipstick: NEGATIVE
Ketones, ur: NEGATIVE
Nitrite: NEGATIVE
Protein, ur: NEGATIVE
Specific Gravity, Urine: 1.021
Urobilinogen, UA: 0.2
pH: 6

## 2011-10-03 LAB — TYPE AND SCREEN
ABO/RH(D): O POS
Antibody Screen: NEGATIVE

## 2011-10-03 LAB — COMPREHENSIVE METABOLIC PANEL
ALT: 13
AST: 26
Albumin: 3.6
Alkaline Phosphatase: 94
BUN: 12
CO2: 31
Calcium: 10
Chloride: 104
Creatinine, Ser: 0.85
GFR calc Af Amer: >60
GFR calc non Af Amer: >60
Glucose, Bld: 82
Potassium: 5.3 — ABNORMAL HIGH
Sodium: 144
Total Bilirubin: 1.1
Total Protein: 6.9

## 2011-10-03 LAB — C-REACTIVE PROTEIN: CRP: 0.5 — ABNORMAL LOW (ref <0.6)

## 2011-10-03 LAB — SEDIMENTATION RATE: Sed Rate: 23 — ABNORMAL HIGH

## 2011-10-03 LAB — PREPARE RBC (CROSSMATCH)

## 2011-10-03 LAB — APTT: aPTT: 30

## 2011-10-05 LAB — TYPE AND SCREEN
ABO/RH(D): O POS
Antibody Screen: NEGATIVE

## 2011-10-05 LAB — CBC
HCT: 25.3 — ABNORMAL LOW
HCT: 27.4 — ABNORMAL LOW
HCT: 32.9 — ABNORMAL LOW
Hemoglobin: 11 — ABNORMAL LOW
Hemoglobin: 8.5 — ABNORMAL LOW
Hemoglobin: 9.4 — ABNORMAL LOW
MCHC: 33.3
MCHC: 33.5
MCHC: 34.1
MCV: 90.8
MCV: 92.3
MCV: 92.3
Platelets: 192
Platelets: 216
Platelets: 256
RBC: 2.74 — ABNORMAL LOW
RBC: 3.02 — ABNORMAL LOW
RBC: 3.56 — ABNORMAL LOW
RDW: 15.4 — ABNORMAL HIGH
RDW: 15.5 — ABNORMAL HIGH
RDW: 16.2 — ABNORMAL HIGH
WBC: 6.7
WBC: 7.6
WBC: 8.3

## 2011-10-05 LAB — BASIC METABOLIC PANEL
BUN: 3 — ABNORMAL LOW
BUN: 4 — ABNORMAL LOW
BUN: 5 — ABNORMAL LOW
CO2: 31
CO2: 31
CO2: 32
Calcium: 8.4
Calcium: 8.7
Calcium: 8.7
Chloride: 103
Chloride: 104
Chloride: 105
Creatinine, Ser: 0.59
Creatinine, Ser: 0.6
Creatinine, Ser: 0.6
GFR calc Af Amer: >60
GFR calc Af Amer: >60
GFR calc Af Amer: >60
GFR calc non Af Amer: >60
GFR calc non Af Amer: >60
GFR calc non Af Amer: >60
Glucose, Bld: 104 — ABNORMAL HIGH
Glucose, Bld: 136 — ABNORMAL HIGH
Glucose, Bld: 93
Potassium: 3.7
Potassium: 3.9
Potassium: 4.2
Sodium: 138
Sodium: 140
Sodium: 142

## 2011-10-05 LAB — URINE CULTURE
Colony Count: NO GROWTH
Culture: NO GROWTH
Special Requests: NEGATIVE

## 2011-10-05 LAB — PROTIME-INR
INR: 1.2
INR: 1.4
INR: 1.5
INR: 1.6 — ABNORMAL HIGH
INR: 2.1 — ABNORMAL HIGH
INR: 2.2 — ABNORMAL HIGH
INR: 2.3 — ABNORMAL HIGH
Prothrombin Time: 15.2
Prothrombin Time: 18.1 — ABNORMAL HIGH
Prothrombin Time: 19.1 — ABNORMAL HIGH
Prothrombin Time: 19.6 — ABNORMAL HIGH
Prothrombin Time: 25 — ABNORMAL HIGH
Prothrombin Time: 25.4 — ABNORMAL HIGH
Prothrombin Time: 26.4 — ABNORMAL HIGH

## 2011-10-05 LAB — URINALYSIS, ROUTINE W REFLEX MICROSCOPIC
Bilirubin Urine: NEGATIVE
Glucose, UA: NEGATIVE
Hgb urine dipstick: NEGATIVE
Ketones, ur: NEGATIVE
Nitrite: NEGATIVE
Protein, ur: NEGATIVE
Specific Gravity, Urine: 1.012
Urobilinogen, UA: 0.2
pH: 7

## 2011-10-05 LAB — ANAEROBIC CULTURE

## 2011-10-05 LAB — CULTURE, ROUTINE-ABSCESS: Culture: NO GROWTH

## 2011-10-05 LAB — PREPARE RBC (CROSSMATCH)

## 2011-10-05 LAB — VANCOMYCIN, TROUGH: Vancomycin Tr: 15.6

## 2011-10-06 LAB — COMPREHENSIVE METABOLIC PANEL
ALT: 12
AST: 20
Albumin: 3.3 — ABNORMAL LOW
Alkaline Phosphatase: 67
BUN: 15
CO2: 26
Calcium: 9.5
Chloride: 104
Creatinine, Ser: 0.7
GFR calc Af Amer: >60
GFR calc non Af Amer: >60
Glucose, Bld: 65 — ABNORMAL LOW
Potassium: 4.4
Sodium: 136
Total Bilirubin: 0.7
Total Protein: 6.7

## 2011-10-06 LAB — URINE MICROSCOPIC-ADD ON

## 2011-10-06 LAB — PROTIME-INR
INR: 1.1
Prothrombin Time: 13.9

## 2011-10-06 LAB — URINALYSIS, ROUTINE W REFLEX MICROSCOPIC
Bilirubin Urine: NEGATIVE
Glucose, UA: NEGATIVE
Hgb urine dipstick: NEGATIVE
Ketones, ur: NEGATIVE
Nitrite: NEGATIVE
Protein, ur: NEGATIVE
Specific Gravity, Urine: 1.028
Urobilinogen, UA: 0.2
pH: 5.5

## 2011-10-06 LAB — CBC
HCT: 34.3 — ABNORMAL LOW
Hemoglobin: 11.3 — ABNORMAL LOW
MCHC: 32.9
MCV: 92.2
Platelets: 340
RBC: 3.72 — ABNORMAL LOW
RDW: 15.7 — ABNORMAL HIGH
WBC: 6.6

## 2011-10-06 LAB — APTT: aPTT: 34

## 2011-10-13 ENCOUNTER — Ambulatory Visit (INDEPENDENT_AMBULATORY_CARE_PROVIDER_SITE_OTHER): Payer: Medicare Other | Admitting: *Deleted

## 2011-10-13 ENCOUNTER — Other Ambulatory Visit: Payer: Self-pay | Admitting: Internal Medicine

## 2011-10-13 ENCOUNTER — Telehealth: Payer: Self-pay | Admitting: Internal Medicine

## 2011-10-13 ENCOUNTER — Encounter: Payer: Self-pay | Admitting: Internal Medicine

## 2011-10-13 DIAGNOSIS — I442 Atrioventricular block, complete: Secondary | ICD-10-CM

## 2011-10-13 NOTE — Telephone Encounter (Signed)
Pt calling wanting to speak with some in device clinic regarding pt having difficulties sending in remote device check. Please return pt call.

## 2011-10-13 NOTE — Telephone Encounter (Signed)
Reviewed procedure for patient to send remote tracing.  Patient understood and will attempt resend.

## 2011-10-16 LAB — REMOTE PACEMAKER DEVICE
AL IMPEDENCE PM: 440 Ohm
AL THRESHOLD: 0.75 V
ATRIAL PACING PM: 89
BAMS-0001: 175 {beats}/min
BATTERY VOLTAGE: 2.74 V
RV LEAD IMPEDENCE PM: 498 Ohm
RV LEAD THRESHOLD: 0.875 V
VENTRICULAR PACING PM: 100

## 2011-10-20 NOTE — Progress Notes (Signed)
Pacer remote check  

## 2011-11-11 ENCOUNTER — Encounter: Payer: Self-pay | Admitting: *Deleted

## 2011-11-21 ENCOUNTER — Telehealth: Payer: Self-pay | Admitting: *Deleted

## 2011-11-21 NOTE — Telephone Encounter (Signed)
patient confirmed over the phone the new date and time of the appointment

## 2011-12-26 ENCOUNTER — Ambulatory Visit (INDEPENDENT_AMBULATORY_CARE_PROVIDER_SITE_OTHER): Payer: Medicare Other | Admitting: Surgery

## 2011-12-26 ENCOUNTER — Telehealth: Payer: Self-pay | Admitting: *Deleted

## 2011-12-26 ENCOUNTER — Encounter (INDEPENDENT_AMBULATORY_CARE_PROVIDER_SITE_OTHER): Payer: Self-pay | Admitting: Surgery

## 2011-12-26 VITALS — HR 99 | Temp 99.1°F | Ht 60.0 in | Wt 145.4 lb

## 2011-12-26 DIAGNOSIS — N61 Mastitis without abscess: Secondary | ICD-10-CM | POA: Diagnosis not present

## 2011-12-26 MED ORDER — CEPHALEXIN 250 MG PO CAPS: 500.0000 mg | ORAL_CAPSULE | Freq: Three times a day (TID) | ORAL | Status: AC

## 2011-12-26 NOTE — Telephone Encounter (Signed)
Patient called asking to see Dr. Welton Flakes.  Reports swelling to left breast noticing difference in size compared to right breast.  Also reports entire breast is red and when she raises her  Arm there is pain under her left arm.  Reports radiation under Dr. Mitzi Hansen ended July 2012 and s/p lumpectomy under Dr. Dwain Sarna in April 2012.  Next F/U with Dr. Welton Flakes is 04-25-2012.  Consulted with RT and patient given Dr. Llana Aliment number to call for evaluation.  Will notify providers.

## 2011-12-26 NOTE — Progress Notes (Signed)
Visit Diagnoses: 1. Mastitis, left breast     HISTORY: Patient is a 78 year old white female who underwent partial mastectomy for ductal carcinoma in situ in the spring of 2012. Postoperatively she was treated with adjuvant radiation therapy. This was completed in July 2012.  The patient now presents with 2 week history of pain, soft tissue swelling, and redness involving the lateral left breast extending towards the axilla.  PERTINENT REVIEW OF SYSTEMS: Discomfort in the left lateral chest and axilla. Limited range of motion. Denies nipple discharge. Denies mass.  EXAM: HEENT: normocephalic; pupils equal and reactive; sclerae clear; dentition good; mucous membranes moist NECK:  symmetric on extension; no palpable anterior or posterior cervical lymphadenopathy; no supraclavicular masses; no tenderness CHEST: A median sternotomy incision well healed. Left lateral breast incision well healed. Generalized induration and edema in the skin of the left lateral breast with mild erythema extending towards the left axilla. Mild tenderness to palpation. No palpable mass. Nipple areolar complex is normal. No lymphadenopathy in the left axilla EXT:  non-tender without edema; no deformity NEURO: no gross focal deficits; no sign of tremor   IMPRESSION: Suspect superficial cellulitis versus mastitis versus radiation change  PLAN: I will empirically treat her with Keflex 500 mg t.i.d. for 10 days. She will return to see her surgeon for followup in 2 weeks.  Velora Heckler, MD, FACS General & Endocrine Surgery [facility]

## 2011-12-26 NOTE — Patient Instructions (Signed)
Advil or aleve for pain and soreness.  Take antibiotic for 10 days.  Follow up with Dr. Dwain Sarna in 2 weeks.  tmg

## 2012-01-02 LAB — MDC_IDC_ENUM_SESS_TYPE_REMOTE
Battery Impedance: 1967 Ohm
Battery Remaining Longevity: 22 mo
Battery Voltage: 2.74 V
Brady Statistic AP VP Percent: 88 %
Brady Statistic AP VS Percent: 0 %
Brady Statistic AS VP Percent: 12 %
Brady Statistic AS VS Percent: 0 %
Date Time Interrogation Session: 20130114154204
Lead Channel Impedance Value: 456 Ohm
Lead Channel Impedance Value: 527 Ohm
Lead Channel Setting Pacing Amplitude: 2 V
Lead Channel Setting Pacing Amplitude: 2.5 V
Lead Channel Setting Pacing Pulse Width: 0.4 ms
Lead Channel Setting Sensing Sensitivity: 2.8 mV

## 2012-01-05 DIAGNOSIS — H43819 Vitreous degeneration, unspecified eye: Secondary | ICD-10-CM | POA: Diagnosis not present

## 2012-01-05 DIAGNOSIS — H353190 Nonexudative age-related macular degeneration, unspecified eye, stage unspecified: Secondary | ICD-10-CM | POA: Diagnosis not present

## 2012-01-05 DIAGNOSIS — H04129 Dry eye syndrome of unspecified lacrimal gland: Secondary | ICD-10-CM | POA: Diagnosis not present

## 2012-01-05 DIAGNOSIS — H40019 Open angle with borderline findings, low risk, unspecified eye: Secondary | ICD-10-CM | POA: Diagnosis not present

## 2012-01-10 ENCOUNTER — Ambulatory Visit (INDEPENDENT_AMBULATORY_CARE_PROVIDER_SITE_OTHER): Payer: Medicare Other | Admitting: General Surgery

## 2012-01-10 ENCOUNTER — Encounter (INDEPENDENT_AMBULATORY_CARE_PROVIDER_SITE_OTHER): Payer: Self-pay | Admitting: General Surgery

## 2012-01-10 VITALS — BP 160/98 | HR 94 | Temp 98.4°F | Ht 60.0 in | Wt 145.4 lb

## 2012-01-10 DIAGNOSIS — N63 Unspecified lump in unspecified breast: Secondary | ICD-10-CM

## 2012-01-10 DIAGNOSIS — Z853 Personal history of malignant neoplasm of breast: Secondary | ICD-10-CM

## 2012-01-10 NOTE — Progress Notes (Signed)
Subjective:     Patient ID: [name], female   DOB: 1930-12-30, 78 y.o.   MRN: [id number]  HPI  This is a 78 year old female who underwent a left breast lumpectomy for ductal carcinoma in situ last year. This was followed by adjuvant radiation therapy which he completed at the end of June. She has had about one month of left breast swelling, pain, and some faint redness on her breast. She was seen about 2 weeks ago by one of my partners and was treated for mastitis. She was administered some antibiotics but this is not improved or changed all over this 2 weeks. She reports the same complaints today. She is also on Aromasin. She's not had a mammogram after she completed her radiation and her surgery yet.     Review of Systems     Objective:   Physical Exam  Pulmonary/Chest: Left breast exhibits skin change (faint redness over breast with some edema) and tenderness (diffuse mild tenderness). Left breast exhibits no inverted nipple, no mass and no nipple discharge. Breasts are asymmetrical (left larger than right).    Lymphadenopathy:       Left axillary: No pectoral and no lateral adenopathy present.      Assessment:     History breast cancer with left breast swelling    Plan:     Will proceed with diagnostic mmg on left side If negative will consider punch biopsy but I think this is likely local skin reaction or possibly breast lymphedema Will refer to PT if all negative.

## 2012-01-17 ENCOUNTER — Ambulatory Visit
Admission: RE | Admit: 2012-01-17 | Discharge: 2012-01-17 | Disposition: A | Discharge status: Home / Self Care | Payer: Medicare Other | Source: Ambulatory Visit | Attending: General Surgery | Admitting: General Surgery

## 2012-01-17 ENCOUNTER — Other Ambulatory Visit (INDEPENDENT_AMBULATORY_CARE_PROVIDER_SITE_OTHER): Payer: Self-pay | Admitting: General Surgery

## 2012-01-17 DIAGNOSIS — N644 Mastodynia: Secondary | ICD-10-CM | POA: Diagnosis not present

## 2012-01-17 DIAGNOSIS — N63 Unspecified lump in unspecified breast: Secondary | ICD-10-CM

## 2012-01-17 DIAGNOSIS — N6459 Other signs and symptoms in breast: Secondary | ICD-10-CM | POA: Diagnosis not present

## 2012-01-19 ENCOUNTER — Encounter: Payer: Self-pay | Admitting: Internal Medicine

## 2012-01-19 ENCOUNTER — Ambulatory Visit (INDEPENDENT_AMBULATORY_CARE_PROVIDER_SITE_OTHER): Payer: Medicare Other | Admitting: *Deleted

## 2012-01-19 DIAGNOSIS — Z950 Presence of cardiac pacemaker: Secondary | ICD-10-CM | POA: Diagnosis not present

## 2012-01-19 DIAGNOSIS — I442 Atrioventricular block, complete: Secondary | ICD-10-CM

## 2012-01-21 LAB — REMOTE PACEMAKER DEVICE
AL IMPEDENCE PM: 449 Ohm
AL THRESHOLD: 0.75 V
ATRIAL PACING PM: 88
BAMS-0001: 175 {beats}/min
BATTERY VOLTAGE: 2.74 V
RV LEAD IMPEDENCE PM: 527 Ohm
RV LEAD THRESHOLD: 0.875 V
VENTRICULAR PACING PM: 100

## 2012-01-25 NOTE — Progress Notes (Signed)
Remote pacer check  

## 2012-01-30 ENCOUNTER — Other Ambulatory Visit (INDEPENDENT_AMBULATORY_CARE_PROVIDER_SITE_OTHER): Payer: Self-pay | Admitting: General Surgery

## 2012-01-30 ENCOUNTER — Encounter: Payer: Self-pay | Admitting: *Deleted

## 2012-01-30 DIAGNOSIS — Z853 Personal history of malignant neoplasm of breast: Secondary | ICD-10-CM

## 2012-01-30 DIAGNOSIS — Z9889 Other specified postprocedural states: Secondary | ICD-10-CM

## 2012-02-02 ENCOUNTER — Ambulatory Visit (INDEPENDENT_AMBULATORY_CARE_PROVIDER_SITE_OTHER): Payer: Medicare Other | Admitting: General Surgery

## 2012-02-02 ENCOUNTER — Encounter (INDEPENDENT_AMBULATORY_CARE_PROVIDER_SITE_OTHER): Payer: Self-pay | Admitting: General Surgery

## 2012-02-02 VITALS — BP 138/96 | HR 88 | Temp 98.8°F | Resp 18 | Ht 60.0 in | Wt 145.4 lb

## 2012-02-02 DIAGNOSIS — L089 Local infection of the skin and subcutaneous tissue, unspecified: Secondary | ICD-10-CM | POA: Diagnosis not present

## 2012-02-02 DIAGNOSIS — Z853 Personal history of malignant neoplasm of breast: Secondary | ICD-10-CM | POA: Diagnosis not present

## 2012-02-02 DIAGNOSIS — D249 Benign neoplasm of unspecified breast: Secondary | ICD-10-CM | POA: Diagnosis not present

## 2012-02-02 NOTE — Progress Notes (Signed)
Subjective:     Patient ID: [name], female   DOB: [birth year], 78 y.o.   MRN: [id number]  HPI  19 yof with left breast unchanged with redness and swelling returns for recheck today.  Review of Systems     Objective:   Physical Exam    left breast unchanged with some faint erythema and swellling no discrete mass Assessment:     History left breast cancer Left breast erythema    Plan:     We discussed a punch biopsy of the affected skin today.  I think this is likely as a result of treatment but I recommended a biopsy.  This was done after cleansing the area, infiltrating lidocaine.  A 5mm punch biopsy was then performed.  This was closed with 3-0 nylon suture.

## 2012-02-07 ENCOUNTER — Telehealth (INDEPENDENT_AMBULATORY_CARE_PROVIDER_SITE_OTHER): Payer: Self-pay | Admitting: General Surgery

## 2012-02-07 NOTE — Telephone Encounter (Signed)
Patient called and stated she had appointment to see Elease Hashimoto to remove a stitch next week, she said she was taking a shower and the stitch was gone so she didn't to come in anymore.  I didn't find anything scheduled for her so I didn't if she was just suppose to walk in, but just letting you know she's not coming if that was the case.

## 2012-02-08 ENCOUNTER — Telehealth (INDEPENDENT_AMBULATORY_CARE_PROVIDER_SITE_OTHER): Payer: Self-pay

## 2012-02-08 NOTE — Telephone Encounter (Signed)
Called pt to notify her that path results were normal per Dr Dwain Sarna.

## 2012-02-13 ENCOUNTER — Ambulatory Visit
Admission: RE | Admit: 2012-02-13 | Discharge: 2012-02-13 | Disposition: A | Discharge status: Home / Self Care | Payer: Medicare Other | Source: Ambulatory Visit | Attending: General Surgery | Admitting: General Surgery

## 2012-02-13 DIAGNOSIS — Z9889 Other specified postprocedural states: Secondary | ICD-10-CM

## 2012-02-13 DIAGNOSIS — Z853 Personal history of malignant neoplasm of breast: Secondary | ICD-10-CM | POA: Diagnosis not present

## 2012-03-20 DIAGNOSIS — I10 Essential (primary) hypertension: Secondary | ICD-10-CM | POA: Diagnosis not present

## 2012-03-20 DIAGNOSIS — Z0000 Encounter for general adult medical examination without abnormal findings: Secondary | ICD-10-CM | POA: Diagnosis not present

## 2012-03-20 DIAGNOSIS — M129 Arthropathy, unspecified: Secondary | ICD-10-CM | POA: Diagnosis not present

## 2012-03-26 DIAGNOSIS — IMO0002 Reserved for concepts with insufficient information to code with codable children: Secondary | ICD-10-CM | POA: Diagnosis not present

## 2012-03-26 DIAGNOSIS — M47812 Spondylosis without myelopathy or radiculopathy, cervical region: Secondary | ICD-10-CM | POA: Diagnosis not present

## 2012-03-26 DIAGNOSIS — M949 Disorder of cartilage, unspecified: Secondary | ICD-10-CM | POA: Diagnosis not present

## 2012-03-26 DIAGNOSIS — M899 Disorder of bone, unspecified: Secondary | ICD-10-CM | POA: Diagnosis not present

## 2012-03-26 DIAGNOSIS — M159 Polyosteoarthritis, unspecified: Secondary | ICD-10-CM | POA: Diagnosis not present

## 2012-03-26 DIAGNOSIS — M542 Cervicalgia: Secondary | ICD-10-CM | POA: Diagnosis not present

## 2012-04-03 DIAGNOSIS — I10 Essential (primary) hypertension: Secondary | ICD-10-CM | POA: Diagnosis not present

## 2012-04-19 ENCOUNTER — Ambulatory Visit (INDEPENDENT_AMBULATORY_CARE_PROVIDER_SITE_OTHER): Payer: Medicare Other | Admitting: *Deleted

## 2012-04-19 ENCOUNTER — Encounter: Payer: Self-pay | Admitting: Internal Medicine

## 2012-04-19 DIAGNOSIS — I442 Atrioventricular block, complete: Secondary | ICD-10-CM

## 2012-04-24 LAB — REMOTE PACEMAKER DEVICE
AL IMPEDENCE PM: 456 Ohm
AL THRESHOLD: 0.75 V
ATRIAL PACING PM: 84
BAMS-0001: 175 {beats}/min
BATTERY VOLTAGE: 2.73 V
RV LEAD IMPEDENCE PM: 534 Ohm
RV LEAD THRESHOLD: 0.75 V
VENTRICULAR PACING PM: 100

## 2012-04-25 ENCOUNTER — Other Ambulatory Visit (HOSPITAL_BASED_OUTPATIENT_CLINIC_OR_DEPARTMENT_OTHER): Payer: Medicare Other | Admitting: Lab

## 2012-04-25 ENCOUNTER — Encounter: Payer: Self-pay | Admitting: Family

## 2012-04-25 ENCOUNTER — Telehealth: Payer: Self-pay | Admitting: Oncology

## 2012-04-25 ENCOUNTER — Ambulatory Visit (HOSPITAL_BASED_OUTPATIENT_CLINIC_OR_DEPARTMENT_OTHER): Payer: Medicare Other | Admitting: Family

## 2012-04-25 VITALS — BP 121/78 | HR 94 | Temp 97.9°F | Ht 60.0 in | Wt 148.6 lb

## 2012-04-25 DIAGNOSIS — C50919 Malignant neoplasm of unspecified site of unspecified female breast: Secondary | ICD-10-CM

## 2012-04-25 DIAGNOSIS — C50419 Malignant neoplasm of upper-outer quadrant of unspecified female breast: Secondary | ICD-10-CM | POA: Diagnosis not present

## 2012-04-25 DIAGNOSIS — Z170 Estrogen receptor positive status [ER+]: Secondary | ICD-10-CM | POA: Diagnosis not present

## 2012-04-25 DIAGNOSIS — D0590 Unspecified type of carcinoma in situ of unspecified breast: Secondary | ICD-10-CM

## 2012-04-25 LAB — CBC WITH DIFFERENTIAL/PLATELET
BASO%: 0.3 % (ref 0.0–2.0)
Basophils Absolute: 0 10*3/uL (ref 0.0–0.1)
EOS%: 1.8 % (ref 0.0–7.0)
Eosinophils Absolute: 0.1 10*3/uL (ref 0.0–0.5)
HCT: 38.2 % (ref 34.8–46.6)
HGB: 12.8 g/dL (ref 11.6–15.9)
LYMPH%: 15.1 % (ref 14.0–49.7)
MCH: 32.8 pg (ref 25.1–34.0)
MCHC: 33.5 g/dL (ref 31.5–36.0)
MCV: 97.9 fL (ref 79.5–101.0)
MONO#: 0.5 10*3/uL (ref 0.1–0.9)
MONO%: 8.7 % (ref 0.0–14.0)
NEUT#: 4.5 10*3/uL (ref 1.5–6.5)
NEUT%: 74.1 % (ref 38.4–76.8)
Platelets: 180 10*3/uL (ref 145–400)
RBC: 3.9 10*6/uL (ref 3.70–5.45)
RDW: 13.3 % (ref 11.2–14.5)
WBC: 6.1 10*3/uL (ref 3.9–10.3)
lymph#: 0.9 10*3/uL (ref 0.9–3.3)

## 2012-04-25 NOTE — Patient Instructions (Signed)
Reduce dose of Vit. E to 800 iu/day. Increase dose of Vit. D to 2000 iu/day.  Mammogram in Feb. 2014.  Can use Vit E vaginally twice weekly for vaginal dryness.  Return in 6 months to see Dr. Welton Flakes.

## 2012-04-25 NOTE — Progress Notes (Signed)
[facility]  Name: [name]                  DATE: 04/25/2012 MRN: [id number]                      DOB: August 19, 1931  REFERRING PHYSICIAN: Massie Maroon., MD  DIAGNOSIS: Patient Active Problem List  Diagnoses Date Noted  . Mastitis, left breast 12/26/2011  . Other symptoms involving cardiovascular system 12/22/2010  . PACEMAKER, PERMANENT 06/21/2010  . ANEMIA 06/17/2010  . MITRAL REGURGITATION 06/17/2010  . HYPERTENSION 06/17/2010  . HYPERTROPHIC OBSTRUCTIVE CARDIOMYOPATHY 06/17/2010  . HEART BLOCK 06/17/2010  . HEMORRHOIDS 06/17/2010  . BRONCHITIS 06/17/2010  . ASTHMA 06/17/2010  . CYSTITIS 06/17/2010  . CHEST PAIN 06/17/2010  . URINARY INCONTINENCE 06/17/2010     Encounter Diagnosis  Name Primary?  . Breast cancer Yes    PREVIOUS THERAPY:  1. Left lumpectomy showing high-grade ductal carcinoma in situ, 04/05/2011.  ER positive, PR positive.   2. Radiation therapy from 05/23/2011 to 06/26/2011 to a total of 52.5 Gy.  CURRENT THERAPY:  Aromasin 25 mg daily since 07/18/2011.  INTERIM HISTORY:  Continues to do well, remains on Aromasin with tolerable side effects. Chief related complaint is arthralgias in the hands, 2-3 hot flashes/day, mild vaginal dryness with one UTI since initiating antiestrogen. No headache or blurred vision. No cough or shortness of breath. No abdominal pain or new bone pain. Has problems with the right leg, has had multiple surgeries for fracture. Bowel and bladder function are normal. Appetite is good, with adequate fluid intake. Remainder of the 10 point  review of systems is negative.  Has pacemaker, has follow-up next week. Last mammo 02/13/12, no evidence of malignancy, 1 yr follow-up recommended.   PHYSICAL EXAM: BP 121/78  Pulse 94  Temp 97.9 F (36.6 C)  Ht 5' (1.524 m)  Wt 148 lb 9.6 oz (67.405 kg)  BMI 29.02 kg/m2 General: Well developed, well nourished, in no acute distress.  EENT: No ocular or oral lesions. No stomatitis.    Respiratory: Lungs are clear to auscultation bilaterally with normal respiratory movement and no accessory muscle use. Cardiac: No murmur, rub or tachycardia. No upper or lower extremity edema.  GI: Abdomen is soft, no palpable hepatosplenomegaly. No fluid wave. No tenderness. Musculoskeletal: No kyphosis, no tenderness over the spine, ribs or hips. Walks with a limp, multiple surgeries, right leg. No edema.  Lymph: No cervical, infraclavicular, axillary or inguinal adenopathy. Neuro: No focal neurological deficits. Psych: Alert and oriented X 3, appropriate mood and affect.  BREAST EXAM: In the supine position, with the right arm over the head, the right breast is mostly fatty replaced. The right nipple is everted. No periareolar edema or nipple discharge. No mass in any quadrant or subareolar region. No redness of the skin. No right axillary adenopathy. With the left arm over the head, the left nipple is everted. No periareolar edema or nipple discharge, mild areolar skin thickening. At the 4 o'clock position, a 4 cm radial incision that is well healed. Mild nodularity underlying the scar. No mass in any quadrant or subareolar region. Radiation tattoos are evident. No redness of the skin. No left axillary adenopathy.    LABORATORY STUDIES:   Results for orders placed in visit on 04/25/12  CBC WITH DIFFERENTIAL      Component Value Range   WBC 6.1  3.9 - 10.3 (10e3/uL)   NEUT# 4.5  1.5 - 6.5 (  10e3/uL)   HGB 12.8  11.6 - 15.9 (g/dL)   HCT 16.1  09.6 - 04.5 (%)   Platelets 180  145 - 400 (10e3/uL)   MCV 97.9  79.5 - 101.0 (fL)   MCH 32.8  25.1 - 34.0 (pg)   MCHC 33.5  31.5 - 36.0 (g/dL)   RBC 4.09  8.11 - 9.14 (10e6/uL)   RDW 13.3  11.2 - 14.5 (%)   lymph# 0.9  0.9 - 3.3 (10e3/uL)   MONO# 0.5  0.1 - 0.9 (10e3/uL)   Eosinophils Absolute 0.1  0.0 - 0.5 (10e3/uL)   Basophils Absolute 0.0  0.0 - 0.1 (10e3/uL)   NEUT% 74.1  38.4 - 76.8 (%)   LYMPH% 15.1  14.0 - 49.7 (%)   MONO% 8.7  0.0 -  14.0 (%)   EOS% 1.8  0.0 - 7.0 (%)   BASO% 0.3  0.0 - 2.0 (%)    IMPRESSION:  78 y/o female with:  1. History DCIS, post lumpectomy and radiation, no evidence of recurrence clinically or radiographically.  2. On Aromasin with good tolerance, minimal side effects of hot flashes and arthralgias.   PLAN:   1. Add Vit E 800 iu daily. Some studies have shown this may be effective for mild hot flashes.  2. Add Vit D 2000 units daily. This may help with arthralgias.  3. Following NCCN guidelines, she will return in 6 months for appt with Dr. Welton Flakes. Routine lab not needed. At 5 years, we will see her once yearly.  4. Mammogram in Feb. 2014.  5. Remain on Aromasin. Total of 5 years planned.   DISCUSSION:

## 2012-04-25 NOTE — Telephone Encounter (Signed)
gv pt appt schedule for nov.  °

## 2012-04-26 LAB — COMPREHENSIVE METABOLIC PANEL
ALT: 12 U/L (ref 0–35)
AST: 22 U/L (ref 0–37)
Albumin: 4.2 g/dL (ref 3.5–5.2)
Alkaline Phosphatase: 82 U/L (ref 39–117)
BUN: 29 mg/dL — ABNORMAL HIGH (ref 6–23)
CO2: 28 mEq/L (ref 19–32)
Calcium: 9.2 mg/dL (ref 8.4–10.5)
Chloride: 103 mEq/L (ref 96–112)
Creatinine, Ser: 1.18 mg/dL — ABNORMAL HIGH (ref 0.50–1.10)
Glucose, Bld: 98 mg/dL (ref 70–99)
Potassium: 4.8 mEq/L (ref 3.5–5.3)
Sodium: 138 mEq/L (ref 135–145)
Total Bilirubin: 0.5 mg/dL (ref 0.3–1.2)
Total Protein: 6.3 g/dL (ref 6.0–8.3)

## 2012-04-26 LAB — VITAMIN D 25 HYDROXY (VIT D DEFICIENCY, FRACTURES): Vit D, 25-Hydroxy: 62 ng/mL (ref 30–89)

## 2012-04-27 NOTE — Progress Notes (Signed)
PPM remote 

## 2012-05-03 DIAGNOSIS — Z85828 Personal history of other malignant neoplasm of skin: Secondary | ICD-10-CM | POA: Diagnosis not present

## 2012-05-03 DIAGNOSIS — B079 Viral wart, unspecified: Secondary | ICD-10-CM | POA: Diagnosis not present

## 2012-05-03 DIAGNOSIS — L821 Other seborrheic keratosis: Secondary | ICD-10-CM | POA: Diagnosis not present

## 2012-05-04 ENCOUNTER — Encounter: Payer: Self-pay | Admitting: *Deleted

## 2012-06-19 DIAGNOSIS — M79609 Pain in unspecified limb: Secondary | ICD-10-CM | POA: Diagnosis not present

## 2012-06-20 DIAGNOSIS — Z0000 Encounter for general adult medical examination without abnormal findings: Secondary | ICD-10-CM | POA: Diagnosis not present

## 2012-06-20 DIAGNOSIS — I10 Essential (primary) hypertension: Secondary | ICD-10-CM | POA: Diagnosis not present

## 2012-06-22 ENCOUNTER — Ambulatory Visit
Admission: RE | Admit: 2012-06-22 | Discharge: 2012-06-22 | Disposition: A | Discharge status: Home / Self Care | Payer: Medicare Other | Source: Ambulatory Visit | Attending: Orthopedic Surgery | Admitting: Orthopedic Surgery

## 2012-06-22 ENCOUNTER — Other Ambulatory Visit: Payer: Self-pay | Admitting: Orthopedic Surgery

## 2012-06-22 DIAGNOSIS — Z96659 Presence of unspecified artificial knee joint: Secondary | ICD-10-CM | POA: Diagnosis not present

## 2012-06-22 DIAGNOSIS — R52 Pain, unspecified: Secondary | ICD-10-CM

## 2012-06-22 DIAGNOSIS — M79609 Pain in unspecified limb: Secondary | ICD-10-CM | POA: Diagnosis not present

## 2012-06-25 DIAGNOSIS — M79609 Pain in unspecified limb: Secondary | ICD-10-CM | POA: Diagnosis not present

## 2012-06-25 DIAGNOSIS — M810 Age-related osteoporosis without current pathological fracture: Secondary | ICD-10-CM | POA: Diagnosis not present

## 2012-06-25 DIAGNOSIS — I10 Essential (primary) hypertension: Secondary | ICD-10-CM | POA: Diagnosis not present

## 2012-06-25 DIAGNOSIS — M129 Arthropathy, unspecified: Secondary | ICD-10-CM | POA: Diagnosis not present

## 2012-07-17 ENCOUNTER — Encounter: Payer: Self-pay | Admitting: Internal Medicine

## 2012-07-17 ENCOUNTER — Ambulatory Visit (INDEPENDENT_AMBULATORY_CARE_PROVIDER_SITE_OTHER): Payer: Medicare Other | Admitting: Internal Medicine

## 2012-07-17 VITALS — BP 124/76 | HR 72 | Ht 60.0 in | Wt 148.1 lb

## 2012-07-17 DIAGNOSIS — I10 Essential (primary) hypertension: Secondary | ICD-10-CM | POA: Diagnosis not present

## 2012-07-17 DIAGNOSIS — I459 Conduction disorder, unspecified: Secondary | ICD-10-CM

## 2012-07-17 DIAGNOSIS — Z950 Presence of cardiac pacemaker: Secondary | ICD-10-CM

## 2012-07-17 LAB — PACEMAKER DEVICE OBSERVATION
AL AMPLITUDE: 2.8 mv
AL IMPEDENCE PM: 460 Ohm
AL THRESHOLD: 0.875 V
ATRIAL PACING PM: 78
BAMS-0001: 175 {beats}/min
BATTERY VOLTAGE: 2.73 V
RV LEAD IMPEDENCE PM: 556 Ohm
RV LEAD THRESHOLD: 0.75 V
VENTRICULAR PACING PM: 100

## 2012-07-17 NOTE — Patient Instructions (Signed)
Your physician wants you to follow-up in: 12 months with Dr Taylor You will receive a reminder letter in the mail two months in advance. If you don't receive a letter, please call our office to schedule the follow-up appointment.   Remote monitoring is used to monitor your Pacemaker of ICD from home. This monitoring reduces the number of office visits required to check your device to one time per year. It allows us to keep an eye on the functioning of your device to ensure it is working properly. You are scheduled for a device check from home on 10/22/12. You may send your transmission at any time that day. If you have a wireless device, the transmission will be sent automatically. After your physician reviews your transmission, you will receive a postcard with your next transmission date.   

## 2012-07-17 NOTE — Assessment & Plan Note (Signed)
Her pacemaker is working normally. She is 99% ventricular paced. She is approximately 1/2 years of battery longevity. Would consider upgrade to a biventricular pacemaker at the time of her change out.

## 2012-07-17 NOTE — Assessment & Plan Note (Signed)
Her blood pressure is well controlled. I discussed the importance of maintaining a low-sodium diet.

## 2012-07-17 NOTE — Progress Notes (Signed)
HPI [name] returns today for followup. She is a very pleasant 78 year old woman with a history of symptomatic atrial arrhythmias, status post AV node ablation and permanent pacemaker insertion. She also is a history of valve surgery and myomectomy secondary to her hypertrophic cardiomyopathy. The patient denies chest pain. She has chronic discomfort in her right leg and has had 3 knee surgeries. She is currently a brace. She notes some dyspnea with exertion though she is limited for the most part by her brace in her leg. Allergies  Allergen Reactions  . Codeine   . Morphine   . Sulfonamide Derivatives      Current Outpatient Prescriptions  Medication Sig Dispense Refill  . albuterol (PROAIR HFA) 108 (90 BASE) MCG/ACT inhaler UAD       . alendronate (FOSAMAX) 70 MG tablet Take 70 mg by mouth every 7 (seven) days. Take with a full glass of water on an empty stomach.      Marland Kitchen amLODipine (NORVASC) 2.5 MG tablet Take 2.5 mg by mouth daily.      Marland Kitchen aspirin 81 MG EC tablet Take 81 mg by mouth daily.        . Cholecalciferol (VITAMIN D3) 2000 UNITS capsule Take 2,000 Units by mouth daily.        . cyclobenzaprine (FLEXERIL) 5 MG tablet Take 5 mg by mouth daily.      . cycloSPORINE (RESTASIS) 0.05 % ophthalmic emulsion 1 drop 2 (two) times daily.        Marland Kitchen exemestane (AROMASIN) 25 MG tablet Take 25 mg by mouth daily after breakfast.      . Fish Oil OIL Take 1,200 mg by mouth daily.        . meloxicam (MOBIC) 15 MG tablet Take 15 mg by mouth daily.        . metoprolol (TOPROL-XL) 100 MG 24 hr tablet Take 100 mg by mouth daily.        . Multiple Vitamin (MULTIVITAMIN) tablet Take 1 tablet by mouth daily.        . traMADol (ULTRAM-ER) 300 MG 24 hr tablet Take 300 mg by mouth daily as needed. 1-2 tablets         Past Medical History  Diagnosis Date  . Hypertension   . Mitral regurgitation   . Hypertrophic obstructive cardiomyopathy     Required septal myotomy  . Urinary incontinence   .  Cystitis   . Hemorrhoids   . Bronchitis   . Asthma   . Anemia   . Heart block     Complete heart block post surgical requiring pacer  . Hypokalemia     Postoperative, improved  . Hyponatremia     Mild postoperative, improved  . Blood transfusion   . Heart murmur   . Constipation   . Arthritis pain   . Cancer     lt breast    ROS:   All systems reviewed and negative except as noted in the HPI.   Past Surgical History  Procedure Date  . Anterior cervical diskectomy and fusion     C-7  . Septal myotomy   . Pacemaker placement   . Replacement total knee bilateral   . Femur fracture surgery     Right  . Wrist fracture surgery     Right  . Shoulder surgery     Right  . Breast biopsy     Left, negative  . Colonoscopy   . Abdominal hysterectomy   . Cardiomyopathy   .  Breast lumpectomy 2012    left breast     Family History  Problem Relation Age of Onset  . Heart disease Mother   . Hypertension Mother   . Pancreatic cancer Mother   . Cancer Mother     pancreatic  . Pancreatic cancer Father   . Cancer Father     pancreatic  . Diabetes Brother   . Pancreatic cancer Other   . Heart disease Brother     Enlarged heart     History   Social History  . Marital Status: Married    Spouse Name: N/A    Number of Children: N/A  . Years of Education: N/A   Occupational History  . Not on file.   Social History Main Topics  . Smoking status: Never Smoker   . Smokeless tobacco: Not on file  . Alcohol Use: No  . Drug Use: No  . Sexually Active: Not on file   Other Topics Concern  . Not on file   Social History Narrative   Bonna Gains childrenHusband will be assisting with care after surgery     BP 124/76  Pulse 72  Ht 5' (1.524 m)  Wt 148 lb 1.9 oz (67.187 kg)  BMI 28.93 kg/m2  Physical Exam:  Well appearing elderly woman, NAD HEENT: Unremarkable Neck:  No JVD, no thyromegally Lungs:  Clear with no wheezes, rales, or rhonchi. HEART:  Regular  rate rhythm, no murmurs, no rubs, no clicks Abd:  soft, positive bowel sounds, no organomegally, no rebound, no guarding Ext:  2 plus pulses, no edema, no cyanosis, no clubbing Skin:  No rashes no nodules Neuro:  CN II through XII intact, motor grossly intact   DEVICE  Normal device function.  See PaceArt for details.   Assess/Plan:

## 2012-09-18 DIAGNOSIS — Z23 Encounter for immunization: Secondary | ICD-10-CM | POA: Diagnosis not present

## 2012-10-22 ENCOUNTER — Encounter: Payer: Medicare Other | Admitting: *Deleted

## 2012-10-25 ENCOUNTER — Telehealth: Payer: Self-pay | Admitting: Oncology

## 2012-10-25 ENCOUNTER — Encounter: Payer: Self-pay | Admitting: Oncology

## 2012-10-25 ENCOUNTER — Ambulatory Visit (HOSPITAL_BASED_OUTPATIENT_CLINIC_OR_DEPARTMENT_OTHER): Payer: Medicare Other | Admitting: Oncology

## 2012-10-25 VITALS — BP 124/75 | HR 87 | Temp 97.8°F | Resp 20 | Ht 60.0 in | Wt 143.8 lb

## 2012-10-25 DIAGNOSIS — D0510 Intraductal carcinoma in situ of unspecified breast: Secondary | ICD-10-CM

## 2012-10-25 DIAGNOSIS — D0590 Unspecified type of carcinoma in situ of unspecified breast: Secondary | ICD-10-CM | POA: Diagnosis not present

## 2012-10-25 DIAGNOSIS — Z170 Estrogen receptor positive status [ER+]: Secondary | ICD-10-CM

## 2012-10-25 NOTE — Progress Notes (Signed)
OFFICE PROGRESS NOTE  CC  [name], MD [address] [place] [address]  DIAGNOSIS: 78 year old female with DCIS of the left breast diagnosed 04/05/2011. The tumor was ER positive PR positive  PRIOR THERAPY:  #1 patient is status post lumpectomy on 04/05/2011 for a high-grade DCIS that was ER and PR positive.  #2 she then underwent radiation therapy from 05/23/2011 2 06/26/2011 for a total of 52.5 gray.  #3 she was then begun on Aromasin 25 mg daily starting 07/18/2011. A total of 5 years of therapy is planned.  CURRENT THERAPY: Aromasin 25 mg daily  INTERVAL HISTORY: [name] 78 y.o. female returns for followup visit at 6 months. Overall she is doing well she does have some arthralgias in the hands also hot flashes especially at nighttime. She has some vaginal dryness but no discharge no urinary tract infections no headaches blurred vision cough shortness of breath chest pains palpitations. She is having some difficulty with her right leg she has a brace on. She is being seen by orthopedics for this. She is using a crutch. She continues to eat well remainder of the 10 point review of systems is negative.  MEDICAL HISTORY: Past Medical History  Diagnosis Date  . Hypertension   . Mitral regurgitation   . Hypertrophic obstructive cardiomyopathy     Required septal myotomy  . Urinary incontinence   . Cystitis   . Hemorrhoids   . Bronchitis   . Asthma   . Anemia   . Heart block     Complete heart block post surgical requiring pacer  . Hypokalemia     Postoperative, improved  . Hyponatremia     Mild postoperative, improved  . Blood transfusion   . Heart murmur   . Constipation   . Arthritis pain   . Cancer     lt breast    ALLERGIES:  is allergic to codeine; morphine; and sulfonamide derivatives.  MEDICATIONS:  Current Outpatient Prescriptions  Medication Sig Dispense Refill  . albuterol (PROAIR HFA) 108 (90 BASE) MCG/ACT inhaler UAD         . alendronate (FOSAMAX) 70 MG tablet Take 70 mg by mouth every 7 (seven) days. Take with a full glass of water on an empty stomach.      Marland Kitchen amLODipine (NORVASC) 2.5 MG tablet Take 2.5 mg by mouth daily.      Marland Kitchen aspirin 81 MG EC tablet Take 81 mg by mouth daily.        . Cholecalciferol (VITAMIN D3) 2000 UNITS capsule Take 2,000 Units by mouth daily.        . cyclobenzaprine (FLEXERIL) 5 MG tablet Take 5 mg by mouth daily.      . cycloSPORINE (RESTASIS) 0.05 % ophthalmic emulsion 1 drop 2 (two) times daily.        Marland Kitchen exemestane (AROMASIN) 25 MG tablet Take 25 mg by mouth daily after breakfast.      . Fish Oil OIL Take 1,200 mg by mouth daily.        . meloxicam (MOBIC) 15 MG tablet Take 15 mg by mouth daily.        . metoprolol (TOPROL-XL) 100 MG 24 hr tablet Take 100 mg by mouth daily.        . Multiple Vitamin (MULTIVITAMIN) tablet Take 1 tablet by mouth daily.        . traMADol (ULTRAM-ER) 300 MG 24 hr tablet Take 300 mg by mouth daily as needed. 1-2 tablets  SURGICAL HISTORY:  Past Surgical History  Procedure Date  . Anterior cervical diskectomy and fusion     C-7  . Septal myotomy   . Pacemaker placement   . Replacement total knee bilateral   . Femur fracture surgery     Right  . Wrist fracture surgery     Right  . Shoulder surgery     Right  . Breast biopsy     Left, negative  . Colonoscopy   . Abdominal hysterectomy   . Cardiomyopathy   . Breast lumpectomy 2012    left breast    REVIEW OF SYSTEMS:  Pertinent items are noted in HPI.   HEALTH MAINTENANCE:  Mammogram Colonoscopy Bone  Scan Pap Smear Eye Exam Vitamin D Lipid Panel  PHYSICAL EXAMINATION: Blood pressure 124/75, pulse 87, temperature 97.8 F (36.6 C), temperature source Oral, resp. rate 20, height 5' (1.524 m), weight 143 lb 12.8 oz (65.227 kg). Body mass index is 28.08 kg/(m^2). ECOG PERFORMANCE STATUS: 1 - Symptomatic but completely ambulatory   General appearance: alert, cooperative and  appears stated age Lymph nodes: Cervical, supraclavicular, and axillary nodes normal. Resp: clear to auscultation bilaterally Back: symmetric, no curvature. ROM normal. No CVA tenderness. Cardio: regular rate and rhythm GI: soft, non-tender; bowel sounds normal; no masses,  no organomegaly Extremities: extremities normal, atraumatic, no cyanosis or edema Neurologic: Grossly normal Bilateral breast exam no masses nipple discharge left breast reveals well-healed incisional scar there is noted to be scar tissue but no discrete masses. Right breast no masses or nipple discharge.  LABORATORY DATA: Lab Results  Component Value Date   WBC 6.1 04/25/2012   HGB 12.8 04/25/2012   HCT 38.2 04/25/2012   MCV 97.9 04/25/2012   PLT 180 04/25/2012      Chemistry      Component Value Date/Time   NA 138 04/25/2012 0903   K 4.8 04/25/2012 0903   CL 103 04/25/2012 0903   CO2 28 04/25/2012 0903   BUN 29* 04/25/2012 0903   CREATININE 1.18* 04/25/2012 0903      Component Value Date/Time   CALCIUM 9.2 04/25/2012 0903   ALKPHOS 82 04/25/2012 0903   AST 22 04/25/2012 0903   ALT 12 04/25/2012 0903   BILITOT 0.5 04/25/2012 0903       RADIOGRAPHIC STUDIES:  No results found.  ASSESSMENT: 78 year old female with DCIS of the left breast status post lumpectomy followed by radiation now on Aromasin as a chemopreventive 25 mg daily. Overall she's tolerating it well. She is taking vitamin D and she has her vitamin D levels checked at her primary care physician's office. She has no evidence of recurrent disease.   PLAN:   #1 patient will continue her Aromasin 25 mg daily.  #2 she'll be seen back in 6 months time in followup or sooner if need arises.   All questions were answered. The patient knows to call the clinic with any problems, questions or concerns. We can certainly see the patient much sooner if necessary.  I spent 15 minutes counseling the patient face to face. The total time spent in the appointment was 30  minutes.    Drue Second, MD Medical/Oncology [facility] [phone] (beeper) [phone] (Office)  10/25/2012, 10:08 AM

## 2012-10-25 NOTE — Patient Instructions (Addendum)
Continue aromasin daily as you  We see you back in 6 months for follow up

## 2012-10-25 NOTE — Telephone Encounter (Signed)
gve the pt her may 2014 appt calendar 

## 2012-10-30 ENCOUNTER — Encounter: Payer: Self-pay | Admitting: *Deleted

## 2012-10-31 ENCOUNTER — Ambulatory Visit (INDEPENDENT_AMBULATORY_CARE_PROVIDER_SITE_OTHER): Payer: Medicare Other | Admitting: *Deleted

## 2012-10-31 DIAGNOSIS — Z950 Presence of cardiac pacemaker: Secondary | ICD-10-CM | POA: Diagnosis not present

## 2012-10-31 DIAGNOSIS — I459 Conduction disorder, unspecified: Secondary | ICD-10-CM | POA: Diagnosis not present

## 2012-11-02 LAB — REMOTE PACEMAKER DEVICE
AL AMPLITUDE: 2.8 mv
AL IMPEDENCE PM: 458 Ohm
AL THRESHOLD: 0.75 V
ATRIAL PACING PM: 71
BAMS-0001: 175 {beats}/min
BATTERY VOLTAGE: 2.72 V
RV LEAD IMPEDENCE PM: 523 Ohm
RV LEAD THRESHOLD: 0.75 V
VENTRICULAR PACING PM: 100

## 2012-11-06 ENCOUNTER — Encounter: Payer: Self-pay | Admitting: *Deleted

## 2012-11-16 ENCOUNTER — Other Ambulatory Visit: Payer: Self-pay | Admitting: Oncology

## 2012-11-20 ENCOUNTER — Encounter: Payer: Self-pay | Admitting: Internal Medicine

## 2012-12-26 DIAGNOSIS — I10 Essential (primary) hypertension: Secondary | ICD-10-CM | POA: Diagnosis not present

## 2012-12-26 DIAGNOSIS — C50919 Malignant neoplasm of unspecified site of unspecified female breast: Secondary | ICD-10-CM | POA: Diagnosis not present

## 2012-12-26 DIAGNOSIS — M810 Age-related osteoporosis without current pathological fracture: Secondary | ICD-10-CM | POA: Diagnosis not present

## 2012-12-26 DIAGNOSIS — M129 Arthropathy, unspecified: Secondary | ICD-10-CM | POA: Diagnosis not present

## 2012-12-27 DIAGNOSIS — R413 Other amnesia: Secondary | ICD-10-CM | POA: Diagnosis not present

## 2012-12-27 DIAGNOSIS — M810 Age-related osteoporosis without current pathological fracture: Secondary | ICD-10-CM | POA: Diagnosis not present

## 2012-12-27 DIAGNOSIS — I10 Essential (primary) hypertension: Secondary | ICD-10-CM | POA: Diagnosis not present

## 2013-01-07 ENCOUNTER — Other Ambulatory Visit: Payer: Self-pay | Admitting: Internal Medicine

## 2013-01-07 DIAGNOSIS — H35379 Puckering of macula, unspecified eye: Secondary | ICD-10-CM | POA: Diagnosis not present

## 2013-01-07 DIAGNOSIS — Z853 Personal history of malignant neoplasm of breast: Secondary | ICD-10-CM

## 2013-01-07 DIAGNOSIS — H35369 Drusen (degenerative) of macula, unspecified eye: Secondary | ICD-10-CM | POA: Diagnosis not present

## 2013-01-07 DIAGNOSIS — H353190 Nonexudative age-related macular degeneration, unspecified eye, stage unspecified: Secondary | ICD-10-CM | POA: Diagnosis not present

## 2013-01-07 DIAGNOSIS — D3130 Benign neoplasm of unspecified choroid: Secondary | ICD-10-CM | POA: Diagnosis not present

## 2013-01-07 DIAGNOSIS — H40019 Open angle with borderline findings, low risk, unspecified eye: Secondary | ICD-10-CM | POA: Diagnosis not present

## 2013-01-07 DIAGNOSIS — H01009 Unspecified blepharitis unspecified eye, unspecified eyelid: Secondary | ICD-10-CM | POA: Diagnosis not present

## 2013-02-04 ENCOUNTER — Ambulatory Visit (INDEPENDENT_AMBULATORY_CARE_PROVIDER_SITE_OTHER): Payer: Medicare Other | Admitting: *Deleted

## 2013-02-04 DIAGNOSIS — I459 Conduction disorder, unspecified: Secondary | ICD-10-CM

## 2013-02-04 DIAGNOSIS — Z950 Presence of cardiac pacemaker: Secondary | ICD-10-CM | POA: Diagnosis not present

## 2013-02-05 ENCOUNTER — Other Ambulatory Visit: Payer: Self-pay | Admitting: Internal Medicine

## 2013-02-05 ENCOUNTER — Encounter: Payer: Self-pay | Admitting: Internal Medicine

## 2013-02-13 ENCOUNTER — Other Ambulatory Visit: Payer: Self-pay | Admitting: Internal Medicine

## 2013-02-13 ENCOUNTER — Ambulatory Visit
Admission: RE | Admit: 2013-02-13 | Discharge: 2013-02-13 | Disposition: A | Discharge status: Home / Self Care | Payer: Medicare Other | Source: Ambulatory Visit | Attending: Internal Medicine | Admitting: Internal Medicine

## 2013-02-13 DIAGNOSIS — Z853 Personal history of malignant neoplasm of breast: Secondary | ICD-10-CM

## 2013-02-13 LAB — REMOTE PACEMAKER DEVICE
AL AMPLITUDE: 2.8 mv
AL IMPEDENCE PM: 484 Ohm
AL THRESHOLD: 1 V
ATRIAL PACING PM: 69
BAMS-0001: 175 {beats}/min
BATTERY VOLTAGE: 2.7 V
RV LEAD IMPEDENCE PM: 535 Ohm
RV LEAD THRESHOLD: 0.75 V
VENTRICULAR PACING PM: 100

## 2013-02-25 ENCOUNTER — Ambulatory Visit
Admission: RE | Admit: 2013-02-25 | Discharge: 2013-02-25 | Disposition: A | Discharge status: Home / Self Care | Payer: Medicare Other | Source: Ambulatory Visit | Attending: Internal Medicine | Admitting: Internal Medicine

## 2013-02-25 DIAGNOSIS — N6489 Other specified disorders of breast: Secondary | ICD-10-CM | POA: Diagnosis not present

## 2013-02-25 DIAGNOSIS — Z853 Personal history of malignant neoplasm of breast: Secondary | ICD-10-CM

## 2013-02-25 DIAGNOSIS — R413 Other amnesia: Secondary | ICD-10-CM | POA: Diagnosis not present

## 2013-02-27 ENCOUNTER — Encounter: Payer: Self-pay | Admitting: *Deleted

## 2013-03-27 DIAGNOSIS — M25569 Pain in unspecified knee: Secondary | ICD-10-CM | POA: Diagnosis not present

## 2013-03-27 DIAGNOSIS — M79609 Pain in unspecified limb: Secondary | ICD-10-CM | POA: Diagnosis not present

## 2013-04-04 DIAGNOSIS — M79609 Pain in unspecified limb: Secondary | ICD-10-CM | POA: Diagnosis not present

## 2013-04-24 ENCOUNTER — Ambulatory Visit (HOSPITAL_BASED_OUTPATIENT_CLINIC_OR_DEPARTMENT_OTHER): Payer: Medicare Other | Admitting: Adult Health

## 2013-04-24 ENCOUNTER — Telehealth: Payer: Self-pay | Admitting: Medical Oncology

## 2013-04-24 ENCOUNTER — Telehealth: Payer: Self-pay | Admitting: Oncology

## 2013-04-24 ENCOUNTER — Encounter: Payer: Self-pay | Admitting: Adult Health

## 2013-04-24 ENCOUNTER — Other Ambulatory Visit (HOSPITAL_BASED_OUTPATIENT_CLINIC_OR_DEPARTMENT_OTHER): Payer: Medicare Other | Admitting: Lab

## 2013-04-24 VITALS — BP 136/74 | HR 94 | Temp 98.0°F | Resp 20 | Ht 60.0 in | Wt 155.8 lb

## 2013-04-24 DIAGNOSIS — D0590 Unspecified type of carcinoma in situ of unspecified breast: Secondary | ICD-10-CM

## 2013-04-24 DIAGNOSIS — E559 Vitamin D deficiency, unspecified: Secondary | ICD-10-CM

## 2013-04-24 DIAGNOSIS — C50912 Malignant neoplasm of unspecified site of left female breast: Secondary | ICD-10-CM

## 2013-04-24 DIAGNOSIS — M899 Disorder of bone, unspecified: Secondary | ICD-10-CM | POA: Diagnosis not present

## 2013-04-24 DIAGNOSIS — Z170 Estrogen receptor positive status [ER+]: Secondary | ICD-10-CM | POA: Diagnosis not present

## 2013-04-24 DIAGNOSIS — D0510 Intraductal carcinoma in situ of unspecified breast: Secondary | ICD-10-CM

## 2013-04-24 DIAGNOSIS — M949 Disorder of cartilage, unspecified: Secondary | ICD-10-CM

## 2013-04-24 LAB — COMPREHENSIVE METABOLIC PANEL (CC13)
ALT: 11 U/L (ref 0–55)
AST: 17 U/L (ref 5–34)
Albumin: 3.6 g/dL (ref 3.5–5.0)
Alkaline Phosphatase: 53 U/L (ref 40–150)
BUN: 21.8 mg/dL (ref 7.0–26.0)
CO2: 28 mEq/L (ref 22–29)
Calcium: 9.2 mg/dL (ref 8.4–10.4)
Chloride: 105 mEq/L (ref 98–107)
Creatinine: 1.2 mg/dL — ABNORMAL HIGH (ref 0.6–1.1)
Glucose: 78 mg/dl (ref 70–99)
Potassium: 4.5 mEq/L (ref 3.5–5.1)
Sodium: 140 mEq/L (ref 136–145)
Total Bilirubin: 0.53 mg/dL (ref 0.20–1.20)
Total Protein: 6.7 g/dL (ref 6.4–8.3)

## 2013-04-24 LAB — CBC WITH DIFFERENTIAL/PLATELET
BASO%: 0.3 % (ref 0.0–2.0)
Basophils Absolute: 0 10*3/uL (ref 0.0–0.1)
EOS%: 1.3 % (ref 0.0–7.0)
Eosinophils Absolute: 0.1 10*3/uL (ref 0.0–0.5)
HCT: 40 % (ref 34.8–46.6)
HGB: 13.1 g/dL (ref 11.6–15.9)
LYMPH%: 18.2 % (ref 14.0–49.7)
MCH: 32.3 pg (ref 25.1–34.0)
MCHC: 32.8 g/dL (ref 31.5–36.0)
MCV: 98.8 fL (ref 79.5–101.0)
MONO#: 0.9 10*3/uL (ref 0.1–0.9)
MONO%: 13.2 % (ref 0.0–14.0)
NEUT#: 4.7 10*3/uL (ref 1.5–6.5)
NEUT%: 67 % (ref 38.4–76.8)
Platelets: 181 10*3/uL (ref 145–400)
RBC: 4.05 10*6/uL (ref 3.70–5.45)
RDW: 13.4 % (ref 11.2–14.5)
WBC: 7 10*3/uL (ref 3.9–10.3)
lymph#: 1.3 10*3/uL (ref 0.9–3.3)
nRBC: 0 % (ref 0–0)

## 2013-04-24 NOTE — Progress Notes (Signed)
OFFICE PROGRESS NOTE  CC  [name], MD [address] [place] [address]  DIAGNOSIS: 79 year old female with DCIS of the left breast diagnosed 04/05/2011. The tumor was ER positive PR positive  PRIOR THERAPY:  #1 patient is status post lumpectomy on 04/05/2011 for a high-grade DCIS that was ER and PR positive.  #2 she then underwent radiation therapy from 05/23/2011 2 06/26/2011 for a total of 52.5 gray.  #3 she was then begun on Aromasin 25 mg daily starting 07/18/2011. A total of 5 years of therapy is planned.  CURRENT THERAPY: Aromasin 25 mg daily  INTERVAL HISTORY: [name] 79 y.o. female returns for followup visit at 6 months.  She is taking Aromasin daily.  She has hot flashes, 1-2 times per week.  She denies joint aches, dryness, or any other concerns.  Her last bone density was in 02/2012 she was put on Fosamax 3 months ago due to the bone trouble in her right tib/fib.  She's previously had surgery in the 70s to the area including a bone graft, and recently developed pain.  An x ray found that the bone in the right leg was thinning.  She was put in a brace by her orthopedic doctor.  She's concerned about the fact that the bone in the right leg is continuing to thin, and about the possibility of the bone breaking.  She'd like for [place] to request the bone density reports from her PCP.    MEDICAL HISTORY: Past Medical History  Diagnosis Date  . Hypertension   . Mitral regurgitation   . Hypertrophic obstructive cardiomyopathy     Required septal myotomy  . Urinary incontinence   . Cystitis   . Hemorrhoids   . Bronchitis   . Asthma   . Anemia   . Heart block     Complete heart block post surgical requiring pacer  . Hypokalemia     Postoperative, improved  . Hyponatremia     Mild postoperative, improved  . Blood transfusion   . Heart murmur   . Constipation   . Arthritis pain   . Cancer     lt breast    ALLERGIES:  is allergic to codeine;  morphine; and sulfonamide derivatives.  MEDICATIONS:  Current Outpatient Prescriptions  Medication Sig Dispense Refill  . albuterol (PROAIR HFA) 108 (90 BASE) MCG/ACT inhaler UAD       . alendronate (FOSAMAX) 70 MG tablet Take 70 mg by mouth every 7 (seven) days. Take with a full glass of water on an empty stomach.      Marland Kitchen amLODipine (NORVASC) 2.5 MG tablet Take 2.5 mg by mouth daily.      Marland Kitchen aspirin 81 MG EC tablet Take 81 mg by mouth daily.        . Cholecalciferol (VITAMIN D3) 2000 UNITS capsule Take 2,000 Units by mouth daily.        . cyclobenzaprine (FLEXERIL) 5 MG tablet Take 5 mg by mouth daily.      . cycloSPORINE (RESTASIS) 0.05 % ophthalmic emulsion 1 drop 2 (two) times daily.        Marland Kitchen exemestane (AROMASIN) 25 MG tablet TAKE 1 TABLET BY MOUTH DAILY  90 tablet  4  . Fish Oil OIL Take 1,200 mg by mouth daily.        . meloxicam (MOBIC) 15 MG tablet Take 15 mg by mouth daily.        . metoprolol (TOPROL-XL) 100 MG 24 hr tablet  Take 100 mg by mouth daily.        . Multiple Vitamin (MULTIVITAMIN) tablet Take 1 tablet by mouth daily.        . traMADol (ULTRAM-ER) 300 MG 24 hr tablet Take 300 mg by mouth daily as needed. 1-2 tablets       No current facility-administered medications for this visit.    SURGICAL HISTORY:  Past Surgical History  Procedure Laterality Date  . Anterior cervical diskectomy and fusion      C-7  . Septal myotomy    . Pacemaker placement    . Replacement total knee bilateral    . Femur fracture surgery      Right  . Wrist fracture surgery      Right  . Shoulder surgery      Right  . Breast biopsy      Left, negative  . Colonoscopy    . Abdominal hysterectomy    . Cardiomyopathy    . Breast lumpectomy  2012    left breast    REVIEW OF SYSTEMS:  Pertinent items are noted in HPI.  HEALTH MAINTENANCE:  Mammogram: 02/2013, normal Colonoscopy: 4 years ago, no follow up Bone Density: 02/2013, osteoporosis on Fosamax Pap Smear:s/p TAH, several  years Eye Exam: 10/2012 Vitamin D: 04/2012 Lipid Panel: 05/2012  PHYSICAL EXAMINATION: Blood pressure 136/74, pulse 94, temperature 98 F (36.7 C), temperature source Oral, resp. rate 20, height 5' (1.524 m), weight 155 lb 12.8 oz (70.67 kg). Body mass index is 30.43 kg/(m^2). General: Patient is a well appearing female in no acute distress HEENT: PERRLA, sclerae anicteric no conjunctival pallor, MMM Neck: supple, no palpable adenopathy Lungs: clear to auscultation bilaterally, no wheezes, rhonchi, or rales Cardiovascular: regular rate rhythm, S1, S2, no murmurs, rubs or gallops Abdomen: Soft, non-tender, non-distended, normoactive bowel sounds, no HSM Extremities: warm and well perfused, no clubbing, cyanosis, or edema Skin: No rashes or lesions Neuro: Non-focal Bilateral breast exam no masses nipple discharge left breast reveals well-healed incisional scar there is noted to be scar tissue but no discrete masses. Right breast no masses or nipple discharge.  ECOG PERFORMANCE STATUS: 1 - Symptomatic but completely ambulatory     LABORATORY DATA: Lab Results  Component Value Date   WBC 7.0 04/24/2013   HGB 13.1 04/24/2013   HCT 40.0 04/24/2013   MCV 98.8 04/24/2013   PLT 181 04/24/2013      Chemistry      Component Value Date/Time   NA 138 04/25/2012 0903   K 4.8 04/25/2012 0903   CL 103 04/25/2012 0903   CO2 28 04/25/2012 0903   BUN 29* 04/25/2012 0903   CREATININE 1.18* 04/25/2012 0903      Component Value Date/Time   CALCIUM 9.2 04/25/2012 0903   ALKPHOS 82 04/25/2012 0903   AST 22 04/25/2012 0903   ALT 12 04/25/2012 0903   BILITOT 0.5 04/25/2012 0903       RADIOGRAPHIC STUDIES:  No results found.  ASSESSMENT: 79 year old female with DCIS of the left breast status post lumpectomy followed by radiation now on Aromasin as a chemopreventive 25 mg daily. Overall she's tolerating it well. She is taking vitamin D and she has her vitamin D levels checked at her primary care physician's office. She  has no evidence of recurrent disease.   PLAN:   #1 Doing well, no sign of recurrence.  We will stop the Aromasin due to the progressive thinning of the right tib/fib.  We will request bone  density results from Dr. Elmyra Ricks office and notes for verification.   #2 . We will see [name] back in 6 months.     All questions were answered. The patient knows to call the clinic with any problems, questions or concerns. We can certainly see the patient much sooner if necessary.  I spent 25 minutes counseling the patient face to face. The total time spent in the appointment was 30 minutes.  Cherie Ouch Lyn Hollingshead, NP Medical Oncology [facility] Phone: [phone] 04/24/2013, 10:56 AM

## 2013-04-24 NOTE — Telephone Encounter (Signed)
Fax for authorization for use/disclosure of protected health information signed by patient, sent to [facility], requesting bone density reports to be faxed to Dr Welton Flakes.

## 2013-04-24 NOTE — Patient Instructions (Addendum)
Doing well.  No sign of recurrence.  Stop the aromasin due to progressive thinning of the bones, particularly your right leg.  Please call us if you have any questions or concerns.

## 2013-04-25 ENCOUNTER — Telehealth: Payer: Self-pay | Admitting: Medical Oncology

## 2013-04-25 NOTE — Telephone Encounter (Signed)
Fax received from [facility] re pt's osteo report. Given to Augustin Schooling, NP.

## 2013-05-08 ENCOUNTER — Other Ambulatory Visit: Payer: Self-pay | Admitting: Dermatology

## 2013-05-08 DIAGNOSIS — Z85828 Personal history of other malignant neoplasm of skin: Secondary | ICD-10-CM | POA: Diagnosis not present

## 2013-05-08 DIAGNOSIS — C44319 Basal cell carcinoma of skin of other parts of face: Secondary | ICD-10-CM | POA: Diagnosis not present

## 2013-05-08 DIAGNOSIS — L570 Actinic keratosis: Secondary | ICD-10-CM | POA: Diagnosis not present

## 2013-05-28 DIAGNOSIS — I10 Essential (primary) hypertension: Secondary | ICD-10-CM | POA: Diagnosis not present

## 2013-05-28 DIAGNOSIS — C50919 Malignant neoplasm of unspecified site of unspecified female breast: Secondary | ICD-10-CM | POA: Diagnosis not present

## 2013-05-28 DIAGNOSIS — R413 Other amnesia: Secondary | ICD-10-CM | POA: Diagnosis not present

## 2013-05-28 DIAGNOSIS — M810 Age-related osteoporosis without current pathological fracture: Secondary | ICD-10-CM | POA: Diagnosis not present

## 2013-07-01 DIAGNOSIS — B029 Zoster without complications: Secondary | ICD-10-CM | POA: Diagnosis not present

## 2013-07-03 ENCOUNTER — Encounter: Payer: Self-pay | Admitting: Internal Medicine

## 2013-07-03 ENCOUNTER — Ambulatory Visit (INDEPENDENT_AMBULATORY_CARE_PROVIDER_SITE_OTHER): Payer: Medicare Other | Admitting: Internal Medicine

## 2013-07-03 VITALS — BP 140/82 | HR 84 | Wt 150.0 lb

## 2013-07-03 DIAGNOSIS — I442 Atrioventricular block, complete: Secondary | ICD-10-CM

## 2013-07-03 DIAGNOSIS — Z950 Presence of cardiac pacemaker: Secondary | ICD-10-CM

## 2013-07-03 DIAGNOSIS — I4891 Unspecified atrial fibrillation: Secondary | ICD-10-CM

## 2013-07-03 DIAGNOSIS — I459 Conduction disorder, unspecified: Secondary | ICD-10-CM | POA: Diagnosis not present

## 2013-07-03 LAB — PACEMAKER DEVICE OBSERVATION
AL AMPLITUDE: 5.6 mv
AL IMPEDENCE PM: 476 Ohm
AL THRESHOLD: 0.75 V
ATRIAL PACING PM: 60
BAMS-0001: 175 {beats}/min
BATTERY VOLTAGE: 2.68 V
RV LEAD IMPEDENCE PM: 538 Ohm
RV LEAD THRESHOLD: 0.75 V
VENTRICULAR PACING PM: 100

## 2013-07-03 MED ORDER — APIXABAN 2.5 MG PO TABS: 2.5000 mg | ORAL_TABLET | Freq: Two times a day (BID) | ORAL | Status: DC

## 2013-07-03 NOTE — Patient Instructions (Addendum)
Your physician wants you to follow-up in: 12 months with Dr Court Joy will receive a reminder letter in the mail two months in advance. If you don't receive a letter, please call our office to schedule the follow-up appointment.   Remote monitoring is used to monitor your Pacemaker or ICD from home. This monitoring reduces the number of office visits required to check your device to one time per year. It allows [place] to keep an eye on the functioning of your device to ensure it is working properly. You are scheduled for a device check from home on 10/07/13. You may send your transmission at any time that day. If you have a wireless device, the transmission will be sent automatically. After your physician reviews your transmission, you will receive a postcard with your next transmission date.  Your physician has recommended you make the following change in your medication:  1) Start Eliquis 2.5mg  twice daily 2) Stop Aspirin

## 2013-07-03 NOTE — Assessment & Plan Note (Signed)
Her Medtronic dual-chamber pacemaker is working normally. She has underlying complete heart block with no escape. She is approximately one year of battery longevity. I'll see her back in a year. She may require earlier followup if her pacemaker battery reaches elective replacement.

## 2013-07-03 NOTE — Assessment & Plan Note (Signed)
This is a new problem, demonstrated on interrogation of her pacemaker. She has had episodes lasting over 4 hours with atrial rates of over 300 beats per minute. I've discussed the treatment options with the patient regarding anticoagulation. Her stroke risk is high. I've recommended initiation of Eliquis. She can stop her aspirin.

## 2013-07-03 NOTE — Progress Notes (Signed)
HPI [name] returns today for followup. She is a very pleasant 79 year old woman with a history of complete heart block, status post pacemaker insertion, hypertension, hypertrophic cardiomyopathy, and hypertension. In the interim, she has been stable. She notes occasional episodes of palpitations and a sensation of not feeling well. No syncope. She has a history of peripheral edema which has been well-controlled with medical therapy a low-sodium diet. Allergies  Allergen Reactions  . Codeine   . Morphine   . Sulfonamide Derivatives      Current Outpatient Prescriptions  Medication Sig Dispense Refill  . albuterol (PROAIR HFA) 108 (90 BASE) MCG/ACT inhaler UAD       . alendronate (FOSAMAX) 70 MG tablet Take 70 mg by mouth every 7 (seven) days. Take with a full glass of water on an empty stomach.      Marland Kitchen amLODipine (NORVASC) 2.5 MG tablet Take 2.5 mg by mouth daily.      Marland Kitchen aspirin 81 MG EC tablet Take 81 mg by mouth daily.        . Cholecalciferol (VITAMIN D3) 2000 UNITS capsule Take 2,000 Units by mouth daily.        . cyclobenzaprine (FLEXERIL) 5 MG tablet Take 5 mg by mouth daily.      . cycloSPORINE (RESTASIS) 0.05 % ophthalmic emulsion 1 drop 2 (two) times daily.        Marland Kitchen lidocaine (LIDODERM) 5 % Place 1 patch onto the skin daily. Remove & Discard patch within 12 hours or as directed by MD      . meloxicam (MOBIC) 15 MG tablet Take 15 mg by mouth daily.        . metoprolol (TOPROL-XL) 100 MG 24 hr tablet Take 100 mg by mouth daily.        . Multiple Vitamin (MULTIVITAMIN) tablet Take 1 tablet by mouth daily.        . traMADol (ULTRAM-ER) 300 MG 24 hr tablet Take 300 mg by mouth daily as needed. 1-2 tablets       No current facility-administered medications for this visit.     Past Medical History  Diagnosis Date  . Hypertension   . Mitral regurgitation   . Hypertrophic obstructive cardiomyopathy     Required septal myotomy  . Urinary incontinence   . Cystitis   . Hemorrhoids    . Bronchitis   . Asthma   . Anemia   . Heart block     Complete heart block post surgical requiring pacer  . Hypokalemia     Postoperative, improved  . Hyponatremia     Mild postoperative, improved  . Blood transfusion   . Heart murmur   . Constipation   . Arthritis pain   . Cancer     lt breast    ROS:   All systems reviewed and negative except as noted in the HPI.   Past Surgical History  Procedure Laterality Date  . Anterior cervical diskectomy and fusion      C-7  . Septal myotomy    . Pacemaker placement    . Replacement total knee bilateral    . Femur fracture surgery      Right  . Wrist fracture surgery      Right  . Shoulder surgery      Right  . Breast biopsy      Left, negative  . Colonoscopy    . Abdominal hysterectomy    . Cardiomyopathy    . Breast lumpectomy  2012  left breast     Family History  Problem Relation Age of Onset  . Heart disease Mother   . Hypertension Mother   . Pancreatic cancer Mother   . Cancer Mother     pancreatic  . Pancreatic cancer Father   . Cancer Father     pancreatic  . Diabetes Brother   . Pancreatic cancer Other   . Heart disease Brother     Enlarged heart     History   Social History  . Marital Status: Married    Spouse Name: N/A    Number of Children: N/A  . Years of Education: N/A   Occupational History  . Not on file.   Social History Main Topics  . Smoking status: Never Smoker   . Smokeless tobacco: Not on file  . Alcohol Use: No  . Drug Use: No  . Sexually Active: Not Currently   Other Topics Concern  . Not on file   Social History Narrative   Married   Two children   Husband will be assisting with care after surgery     BP 140/82  Pulse 84  Wt 150 lb (68.04 kg)  BMI 29.3 kg/m2  Physical Exam:  Well appearing elderly woman, NAD HEENT: Unremarkable Neck:  6 cm JVD, no thyromegally Back:  No CVA tenderness Lungs:  Clear with no wheezes, rales, or rhonchi. HEART:   Regular rate rhythm, no murmurs, no rubs, no clicks Abd:  soft, positive bowel sounds, no organomegally, no rebound, no guarding Ext:  2 plus pulses, no edema, no cyanosis, no clubbing Skin:  No rashes no nodules Neuro:  CN II through XII intact, motor grossly intact  EKG - normal sinus rhythm with ventricular pacing  DEVICE  Normal device function.  See PaceArt for details.   Assess/Plan:

## 2013-07-05 ENCOUNTER — Encounter: Payer: Self-pay | Admitting: Internal Medicine

## 2013-07-15 ENCOUNTER — Telehealth: Payer: Self-pay | Admitting: Internal Medicine

## 2013-07-15 DIAGNOSIS — I4891 Unspecified atrial fibrillation: Secondary | ICD-10-CM

## 2013-07-15 MED ORDER — APIXABAN 2.5 MG PO TABS: 2.5000 mg | ORAL_TABLET | Freq: Two times a day (BID) | ORAL | Status: DC

## 2013-07-15 NOTE — Telephone Encounter (Signed)
New Prob     Requesting a new prescription of ELIQUIS to [facility].

## 2013-07-15 NOTE — Telephone Encounter (Signed)
Patient called [facility] about her Rx for Eliquis. They stated they did not receive one. Patient requesting Rx be called in to [facility]. Completed.

## 2013-09-19 DIAGNOSIS — M25569 Pain in unspecified knee: Secondary | ICD-10-CM | POA: Diagnosis not present

## 2013-09-24 ENCOUNTER — Ambulatory Visit: Payer: Self-pay | Admitting: General Practice

## 2013-09-24 DIAGNOSIS — M25569 Pain in unspecified knee: Secondary | ICD-10-CM | POA: Diagnosis not present

## 2013-09-27 DIAGNOSIS — M79609 Pain in unspecified limb: Secondary | ICD-10-CM | POA: Diagnosis not present

## 2013-10-03 DIAGNOSIS — M545 Low back pain, unspecified: Secondary | ICD-10-CM | POA: Diagnosis not present

## 2013-10-03 DIAGNOSIS — M6281 Muscle weakness (generalized): Secondary | ICD-10-CM | POA: Diagnosis not present

## 2013-10-07 ENCOUNTER — Ambulatory Visit (INDEPENDENT_AMBULATORY_CARE_PROVIDER_SITE_OTHER): Payer: Medicare Other | Admitting: *Deleted

## 2013-10-07 DIAGNOSIS — Z950 Presence of cardiac pacemaker: Secondary | ICD-10-CM | POA: Diagnosis not present

## 2013-10-07 DIAGNOSIS — I442 Atrioventricular block, complete: Secondary | ICD-10-CM | POA: Diagnosis not present

## 2013-10-08 ENCOUNTER — Encounter: Payer: Self-pay | Admitting: Internal Medicine

## 2013-10-08 DIAGNOSIS — M545 Low back pain, unspecified: Secondary | ICD-10-CM | POA: Diagnosis not present

## 2013-10-08 DIAGNOSIS — M6281 Muscle weakness (generalized): Secondary | ICD-10-CM | POA: Diagnosis not present

## 2013-10-10 DIAGNOSIS — M6281 Muscle weakness (generalized): Secondary | ICD-10-CM | POA: Diagnosis not present

## 2013-10-10 DIAGNOSIS — M545 Low back pain, unspecified: Secondary | ICD-10-CM | POA: Diagnosis not present

## 2013-10-15 DIAGNOSIS — M545 Low back pain, unspecified: Secondary | ICD-10-CM | POA: Diagnosis not present

## 2013-10-15 DIAGNOSIS — M6281 Muscle weakness (generalized): Secondary | ICD-10-CM | POA: Diagnosis not present

## 2013-10-17 DIAGNOSIS — M6281 Muscle weakness (generalized): Secondary | ICD-10-CM | POA: Diagnosis not present

## 2013-10-17 DIAGNOSIS — M545 Low back pain, unspecified: Secondary | ICD-10-CM | POA: Diagnosis not present

## 2013-10-17 DIAGNOSIS — Z96659 Presence of unspecified artificial knee joint: Secondary | ICD-10-CM | POA: Diagnosis not present

## 2013-10-17 DIAGNOSIS — M25569 Pain in unspecified knee: Secondary | ICD-10-CM | POA: Diagnosis not present

## 2013-10-17 LAB — REMOTE PACEMAKER DEVICE
AL AMPLITUDE: 2.8 mv
AL IMPEDENCE PM: 516 Ohm
AL THRESHOLD: 0.875 V
ATRIAL PACING PM: 35
BAMS-0001: 175 {beats}/min
BATTERY VOLTAGE: 2.59 V
RV LEAD IMPEDENCE PM: 547 Ohm
RV LEAD THRESHOLD: 0.875 V
VENTRICULAR PACING PM: 99

## 2013-10-24 ENCOUNTER — Telehealth: Payer: Self-pay | Admitting: Oncology

## 2013-10-24 ENCOUNTER — Encounter (INDEPENDENT_AMBULATORY_CARE_PROVIDER_SITE_OTHER): Payer: Self-pay

## 2013-10-24 ENCOUNTER — Ambulatory Visit (HOSPITAL_BASED_OUTPATIENT_CLINIC_OR_DEPARTMENT_OTHER): Payer: Medicare Other | Admitting: Adult Health

## 2013-10-24 ENCOUNTER — Other Ambulatory Visit (HOSPITAL_BASED_OUTPATIENT_CLINIC_OR_DEPARTMENT_OTHER): Payer: Medicare Other | Admitting: Lab

## 2013-10-24 ENCOUNTER — Encounter: Payer: Self-pay | Admitting: Adult Health

## 2013-10-24 VITALS — BP 103/66 | HR 65 | Temp 98.3°F | Resp 20 | Ht 60.0 in | Wt 158.2 lb

## 2013-10-24 DIAGNOSIS — D0590 Unspecified type of carcinoma in situ of unspecified breast: Secondary | ICD-10-CM

## 2013-10-24 DIAGNOSIS — C50912 Malignant neoplasm of unspecified site of left female breast: Secondary | ICD-10-CM

## 2013-10-24 DIAGNOSIS — Z170 Estrogen receptor positive status [ER+]: Secondary | ICD-10-CM

## 2013-10-24 LAB — CBC WITH DIFFERENTIAL/PLATELET
BASO%: 0.6 % (ref 0.0–2.0)
Basophils Absolute: 0 10*3/uL (ref 0.0–0.1)
EOS%: 1.7 % (ref 0.0–7.0)
Eosinophils Absolute: 0.1 10*3/uL (ref 0.0–0.5)
HCT: 35.7 % (ref 34.8–46.6)
HGB: 11.6 g/dL (ref 11.6–15.9)
LYMPH%: 13.4 % — ABNORMAL LOW (ref 14.0–49.7)
MCH: 33.6 pg (ref 25.1–34.0)
MCHC: 32.5 g/dL (ref 31.5–36.0)
MCV: 103.3 fL — ABNORMAL HIGH (ref 79.5–101.0)
MONO#: 0.7 10*3/uL (ref 0.1–0.9)
MONO%: 11 % (ref 0.0–14.0)
NEUT#: 4.6 10*3/uL (ref 1.5–6.5)
NEUT%: 73.3 % (ref 38.4–76.8)
Platelets: 159 10*3/uL (ref 145–400)
RBC: 3.45 10*6/uL — ABNORMAL LOW (ref 3.70–5.45)
RDW: 15.4 % — ABNORMAL HIGH (ref 11.2–14.5)
WBC: 6.3 10*3/uL (ref 3.9–10.3)
lymph#: 0.8 10*3/uL — ABNORMAL LOW (ref 0.9–3.3)

## 2013-10-24 LAB — COMPREHENSIVE METABOLIC PANEL (CC13)
ALT: 17 U/L (ref 0–55)
AST: 17 U/L (ref 5–34)
Albumin: 3.2 g/dL — ABNORMAL LOW (ref 3.5–5.0)
Alkaline Phosphatase: 62 U/L (ref 40–150)
Anion Gap: 10 mEq/L (ref 3–11)
BUN: 19.6 mg/dL (ref 7.0–26.0)
CO2: 26 mEq/L (ref 22–29)
Calcium: 9.4 mg/dL (ref 8.4–10.4)
Chloride: 107 mEq/L (ref 98–109)
Creatinine: 1.1 mg/dL (ref 0.6–1.1)
Glucose: 112 mg/dl (ref 70–140)
Potassium: 4.3 mEq/L (ref 3.5–5.1)
Sodium: 143 mEq/L (ref 136–145)
Total Bilirubin: 0.6 mg/dL (ref 0.20–1.20)
Total Protein: 6.1 g/dL — ABNORMAL LOW (ref 6.4–8.3)

## 2013-10-24 NOTE — Progress Notes (Addendum)
OFFICE PROGRESS NOTE  CC  [name], MD [address] [place] [address]  DIAGNOSIS: 79 year old female with DCIS of the left breast diagnosed 04/05/2011. The tumor was ER positive PR positive  PRIOR THERAPY:  #1 patient is status post lumpectomy on 04/05/2011 for a high-grade DCIS that was ER and PR positive.  #2 she then underwent radiation therapy from 05/23/2011 2 06/26/2011 for a total of 52.5 gray.  #3 she was then begun on Aromasin 25 mg daily starting 07/18/2011. A total of 5 years of therapy is planned.  This was discontinued in April 2014 due to continued worsening in osteoporosis.    CURRENT THERAPY: Observation  INTERVAL HISTORY: [name] 79 y.o. female returns for followup visit at 6 months.  She was on Aromasin and has stopped it due to continued osteoporosis and a right leg fracture.  She continues to have problem with her right leg, and pain, but otherwise, a 10 point ROS is negative.    MEDICAL HISTORY: Past Medical History  Diagnosis Date  . Hypertension   . Mitral regurgitation   . Hypertrophic obstructive cardiomyopathy     Required septal myotomy  . Urinary incontinence   . Cystitis   . Hemorrhoids   . Bronchitis   . Asthma   . Anemia   . Heart block     Complete heart block post surgical requiring pacer  . Hypokalemia     Postoperative, improved  . Hyponatremia     Mild postoperative, improved  . Blood transfusion   . Heart murmur   . Constipation   . Arthritis pain   . Cancer     lt breast    ALLERGIES:  is allergic to codeine; morphine; and sulfonamide derivatives.  MEDICATIONS:  Current Outpatient Prescriptions  Medication Sig Dispense Refill  . albuterol (PROAIR HFA) 108 (90 BASE) MCG/ACT inhaler UAD       . alendronate (FOSAMAX) 70 MG tablet Take 70 mg by mouth every 7 (seven) days. Take with a full glass of water on an empty stomach.      Marland Kitchen amLODipine (NORVASC) 2.5 MG tablet Take 2.5 mg by mouth daily.       Marland Kitchen apixaban (ELIQUIS) 2.5 MG TABS tablet Take 1 tablet (2.5 mg total) by mouth 2 (two) times daily.  180 tablet  3  . Cholecalciferol (VITAMIN D3) 2000 UNITS capsule Take 2,000 Units by mouth daily.        . cyclobenzaprine (FLEXERIL) 5 MG tablet Take 5 mg by mouth daily.      . cycloSPORINE (RESTASIS) 0.05 % ophthalmic emulsion 1 drop 2 (two) times daily.        Marland Kitchen lidocaine (LIDODERM) 5 % Place 1 patch onto the skin daily. Remove & Discard patch within 12 hours or as directed by MD      . metoprolol (TOPROL-XL) 100 MG 24 hr tablet Take 100 mg by mouth daily.        . Multiple Vitamin (MULTIVITAMIN) tablet Take 1 tablet by mouth daily.        . traMADol (ULTRAM-ER) 300 MG 24 hr tablet Take 300 mg by mouth daily as needed. 1-2 tablets       No current facility-administered medications for this visit.    SURGICAL HISTORY:  Past Surgical History  Procedure Laterality Date  . Anterior cervical diskectomy and fusion      C-7  . Septal myotomy    . Pacemaker placement    .  Replacement total knee bilateral    . Femur fracture surgery      Right  . Wrist fracture surgery      Right  . Shoulder surgery      Right  . Breast biopsy      Left, negative  . Colonoscopy    . Abdominal hysterectomy    . Cardiomyopathy    . Breast lumpectomy  2012    left breast    REVIEW OF SYSTEMS:  A 10 point review of systems was conducted and is otherwise negative except for what is noted above.     HEALTH MAINTENANCE:  Mammogram: 02/2013, normal Colonoscopy: 4 years ago, no follow up Bone Density: 02/2013, osteoporosis on Fosamax Pap Smear:s/p TAH, several years Eye Exam: 10/2012 Vitamin D: 04/2013 Lipid Panel: 05/2012  PHYSICAL EXAMINATION: Blood pressure 103/66, pulse 65, temperature 98.3 F (36.8 C), temperature source Oral, resp. rate 20, height 5' (1.524 m), weight 158 lb 3.2 oz (71.759 kg). Body mass index is 30.9 kg/(m^2). General: Patient is a well appearing female in no acute  distress HEENT: PERRLA, sclerae anicteric no conjunctival pallor, MMM Neck: supple, no palpable adenopathy Lungs: clear to auscultation bilaterally, no wheezes, rhonchi, or rales Cardiovascular: regular rate rhythm, S1, S2, no murmurs, rubs or gallops Abdomen: Soft, non-tender, non-distended, normoactive bowel sounds, no HSM Extremities: warm and well perfused, no clubbing, cyanosis, or edema Skin: No rashes or lesions Neuro: Non-focal Bilateral breast exam no masses nipple discharge left breast reveals well-healed incisional scar there is noted to be scar tissue but no discrete masses. Right breast no masses or nipple discharge. ECOG PERFORMANCE STATUS: 1 - Symptomatic but completely ambulatory     LABORATORY DATA: Lab Results  Component Value Date   WBC 6.3 10/24/2013   HGB 11.6 10/24/2013   HCT 35.7 10/24/2013   MCV 103.3* 10/24/2013   PLT 159 10/24/2013      Chemistry      Component Value Date/Time   NA 143 10/24/2013 0934   NA 138 04/25/2012 0903   K 4.3 10/24/2013 0934   K 4.8 04/25/2012 0903   CL 105 04/24/2013 1002   CL 103 04/25/2012 0903   CO2 26 10/24/2013 0934   CO2 28 04/25/2012 0903   BUN 19.6 10/24/2013 0934   BUN 29* 04/25/2012 0903   CREATININE 1.1 10/24/2013 0934   CREATININE 1.18* 04/25/2012 0903      Component Value Date/Time   CALCIUM 9.4 10/24/2013 0934   CALCIUM 9.2 04/25/2012 0903   ALKPHOS 62 10/24/2013 0934   ALKPHOS 82 04/25/2012 0903   AST 17 10/24/2013 0934   AST 22 04/25/2012 0903   ALT 17 10/24/2013 0934   ALT 12 04/25/2012 0903   BILITOT 0.60 10/24/2013 0934   BILITOT 0.5 04/25/2012 0903       RADIOGRAPHIC STUDIES:  No results found.  ASSESSMENT: 79 year old female with DCIS of the left breast status post lumpectomy followed by radiation previously on Aromasin as a chemopreventive 25 mg daily. She is taking vitamin D and she has her vitamin D levels checked at her primary care physician's office. She has no evidence of recurrent disease.   PLAN:   #1 Doing  well, no sign of recurrence.  We will continue to observe her.  She will continue to follow with Dr. Ernest Pine for her right leg.  She will not receive any further adjuvant anti-estrogen therapy due to her progressive osteoporosis and leg fracture.   #2 . We will see Ms. Demeritt  back in 6 months.    All questions were answered. The patient knows to call the clinic with any problems, questions or concerns. We can certainly see the patient much sooner if necessary.  I spent 25 minutes counseling the patient face to face. The total time spent in the appointment was 30 minutes.  Illa Level, NP Medical Oncology [facility] [phone]   10/25/2013, 1:09 PM      ATTENDING'S ATTESTATION:  I personally reviewed patient's chart, examined patient myself, formulated the treatment plan as followed.    Patient had a history of DCIS of the left breast she underwent lumpectomy followed by radiation and Aromasin. She has been doing well. She has no evidence of recurrent disease. However we will see her back in 6 months time.  Drue Second, MD Medical/Oncology [facility] [phone] (beeper) [phone] (Office)  10/25/2013, 5:11 PM

## 2013-10-25 ENCOUNTER — Encounter: Payer: Self-pay | Admitting: *Deleted

## 2013-11-04 DIAGNOSIS — J069 Acute upper respiratory infection, unspecified: Secondary | ICD-10-CM | POA: Diagnosis not present

## 2013-11-04 DIAGNOSIS — J45901 Unspecified asthma with (acute) exacerbation: Secondary | ICD-10-CM | POA: Diagnosis not present

## 2013-11-11 ENCOUNTER — Encounter: Payer: Self-pay | Admitting: Internal Medicine

## 2013-11-11 ENCOUNTER — Ambulatory Visit (INDEPENDENT_AMBULATORY_CARE_PROVIDER_SITE_OTHER): Payer: Medicare Other | Admitting: *Deleted

## 2013-11-11 DIAGNOSIS — I442 Atrioventricular block, complete: Secondary | ICD-10-CM

## 2013-11-18 ENCOUNTER — Encounter (HOSPITAL_COMMUNITY): Payer: Self-pay | Admitting: Pharmacist

## 2013-11-18 ENCOUNTER — Ambulatory Visit (INDEPENDENT_AMBULATORY_CARE_PROVIDER_SITE_OTHER): Payer: Medicare Other | Admitting: Internal Medicine

## 2013-11-18 ENCOUNTER — Encounter: Payer: Self-pay | Admitting: *Deleted

## 2013-11-18 ENCOUNTER — Encounter: Payer: Self-pay | Admitting: Internal Medicine

## 2013-11-18 VITALS — BP 130/74 | HR 64 | Ht 60.0 in | Wt 144.0 lb

## 2013-11-18 DIAGNOSIS — Z950 Presence of cardiac pacemaker: Secondary | ICD-10-CM

## 2013-11-18 DIAGNOSIS — I442 Atrioventricular block, complete: Secondary | ICD-10-CM

## 2013-11-18 DIAGNOSIS — I10 Essential (primary) hypertension: Secondary | ICD-10-CM

## 2013-11-18 DIAGNOSIS — I4891 Unspecified atrial fibrillation: Secondary | ICD-10-CM | POA: Diagnosis not present

## 2013-11-18 DIAGNOSIS — I459 Conduction disorder, unspecified: Secondary | ICD-10-CM | POA: Diagnosis not present

## 2013-11-18 DIAGNOSIS — Z01812 Encounter for preprocedural laboratory examination: Secondary | ICD-10-CM

## 2013-11-18 NOTE — Assessment & Plan Note (Signed)
Her Medtronic pacemaker has reached elective replacement. We will schedule pacemaker removal and insertion of a new device in the next week or 2.

## 2013-11-18 NOTE — Patient Instructions (Signed)
Your physician recommends that you continue on your current medications as directed. Please refer to the Current Medication list given to you today.  Your physician has recommended that you have a pacemaker generator change on 11/27/13  Your physician recommends that you return for pre-procedure lab work on: 11/22/13

## 2013-11-18 NOTE — Progress Notes (Signed)
    HPI [name] returns today for followup. She is a very pleasant 79-year-old woman with a history of complete heart block, status post permanent pacemaker insertion, hypertension, and arthritis. In the interim she has been stable except for pain with her right lower leg. She has reached elective replacement on her pacemaker. She denies chest pain, shortness of breath, or syncope. Allergies  Allergen Reactions  . Codeine   . Morphine   . Sulfonamide Derivatives      Current Outpatient Prescriptions  Medication Sig Dispense Refill  . albuterol (PROAIR HFA) 108 (90 BASE) MCG/ACT inhaler UAD       . alendronate (FOSAMAX) 70 MG tablet Take 70 mg by mouth every 7 (seven) days. Take with a full glass of water on an empty stomach.      . amLODipine (NORVASC) 2.5 MG tablet Take 2.5 mg by mouth daily.      . apixaban (ELIQUIS) 2.5 MG TABS tablet Take 1 tablet (2.5 mg total) by mouth 2 (two) times daily.  180 tablet  3  . Cholecalciferol (VITAMIN D3) 2000 UNITS capsule Take 2,000 Units by mouth daily.        . cyclobenzaprine (FLEXERIL) 5 MG tablet Take 5 mg by mouth daily.      . cycloSPORINE (RESTASIS) 0.05 % ophthalmic emulsion 1 drop 2 (two) times daily.        . lidocaine (LIDODERM) 5 % Place 1 patch onto the skin daily. Remove & Discard patch within 12 hours or as directed by MD      . metoprolol (TOPROL-XL) 100 MG 24 hr tablet Take 100 mg by mouth daily.        . Multiple Vitamin (MULTIVITAMIN) tablet Take 1 tablet by mouth daily.        . traMADol (ULTRAM-ER) 300 MG 24 hr tablet Take 300 mg by mouth daily as needed. 1-2 tablets       No current facility-administered medications for this visit.     Past Medical History  Diagnosis Date  . Hypertension   . Mitral regurgitation   . Hypertrophic obstructive cardiomyopathy     Required septal myotomy  . Urinary incontinence   . Cystitis   . Hemorrhoids   . Bronchitis   . Asthma   . Anemia   . Heart block     Complete heart  block post surgical requiring pacer  . Hypokalemia     Postoperative, improved  . Hyponatremia     Mild postoperative, improved  . Blood transfusion   . Heart murmur   . Constipation   . Arthritis pain   . Cancer     lt breast    ROS:   All systems reviewed and negative except as noted in the HPI.   Past Surgical History  Procedure Laterality Date  . Anterior cervical diskectomy and fusion      C-7  . Septal myotomy    . Pacemaker placement    . Replacement total knee bilateral    . Femur fracture surgery      Right  . Wrist fracture surgery      Right  . Shoulder surgery      Right  . Breast biopsy      Left, negative  . Colonoscopy    . Abdominal hysterectomy    . Cardiomyopathy    . Breast lumpectomy  2012    left breast     Family History  Problem Relation Age of Onset  .   Heart disease Mother   . Hypertension Mother   . Pancreatic cancer Mother   . Cancer Mother     pancreatic  . Pancreatic cancer Father   . Cancer Father     pancreatic  . Diabetes Brother   . Pancreatic cancer Other   . Heart disease Brother     Enlarged heart     History   Social History  . Marital Status: Married    Spouse Name: N/A    Number of Children: N/A  . Years of Education: N/A   Occupational History  . Not on file.   Social History Main Topics  . Smoking status: Never Smoker   . Smokeless tobacco: Not on file  . Alcohol Use: No  . Drug Use: No  . Sexual Activity: Not Currently   Other Topics Concern  . Not on file   Social History Narrative   Married   Two children   [name] will be assisting with care after surgery     BP 130/74  Pulse 64  Ht 5' (1.524 m)  Wt 144 lb (65.318 kg)  BMI 28.12 kg/m2  Physical Exam:  Well appearing 79-year-old woman, NAD HEENT: Unremarkable Neck:  No JVD, no thyromegally Back:  No CVA tenderness Lungs:  Clear with no wheezes, rales, or rhonchi. HEART:  Regular rate rhythm, no murmurs, no rubs, no  clicks Abd:  soft, positive bowel sounds, no organomegally, no rebound, no guarding Ext:  2 plus pulses, no edema, no cyanosis, no clubbing, right lower leg brace in place. Skin:  No rashes no nodules Neuro:  CN II through XII intact, motor grossly intact  DEVICE  Normal device function.  See PaceArt for details.   Assess/Plan: 

## 2013-11-18 NOTE — Assessment & Plan Note (Signed)
Her blood pressure is well controlled. She will continue her current medical therapy, and maintain a low-sodium diet. 

## 2013-11-21 ENCOUNTER — Encounter: Payer: Self-pay | Admitting: *Deleted

## 2013-11-22 ENCOUNTER — Other Ambulatory Visit (INDEPENDENT_AMBULATORY_CARE_PROVIDER_SITE_OTHER): Payer: Medicare Other

## 2013-11-22 DIAGNOSIS — Z950 Presence of cardiac pacemaker: Secondary | ICD-10-CM

## 2013-11-22 DIAGNOSIS — Z01812 Encounter for preprocedural laboratory examination: Secondary | ICD-10-CM | POA: Diagnosis not present

## 2013-11-22 DIAGNOSIS — I4891 Unspecified atrial fibrillation: Secondary | ICD-10-CM | POA: Diagnosis not present

## 2013-11-22 DIAGNOSIS — I442 Atrioventricular block, complete: Secondary | ICD-10-CM

## 2013-11-22 LAB — CBC WITH DIFFERENTIAL/PLATELET
Basophils Absolute: 0 10*3/uL (ref 0.0–0.1)
Basophils Relative: 0.7 % (ref 0.0–3.0)
Eosinophils Absolute: 0.1 10*3/uL (ref 0.0–0.7)
Eosinophils Relative: 1.8 % (ref 0.0–5.0)
HCT: 37.4 % (ref 36.0–46.0)
Hemoglobin: 12.4 g/dL (ref 12.0–15.0)
Lymphocytes Relative: 20 % (ref 12.0–46.0)
Lymphs Abs: 1.1 10*3/uL (ref 0.7–4.0)
MCHC: 33.1 g/dL (ref 30.0–36.0)
MCV: 101.4 fl — ABNORMAL HIGH (ref 78.0–100.0)
Monocytes Absolute: 0.8 10*3/uL (ref 0.1–1.0)
Monocytes Relative: 15 % — ABNORMAL HIGH (ref 3.0–12.0)
Neutro Abs: 3.3 10*3/uL (ref 1.4–7.7)
Neutrophils Relative %: 62.5 % (ref 43.0–77.0)
Platelets: 205 10*3/uL (ref 150.0–400.0)
RBC: 3.69 Mil/uL — ABNORMAL LOW (ref 3.87–5.11)
RDW: 16.8 % — ABNORMAL HIGH (ref 11.5–14.6)
WBC: 5.3 10*3/uL (ref 4.5–10.5)

## 2013-11-22 LAB — BASIC METABOLIC PANEL
BUN: 17 mg/dL (ref 6–23)
CO2: 29 mEq/L (ref 19–32)
Calcium: 9.3 mg/dL (ref 8.4–10.5)
Chloride: 103 mEq/L (ref 96–112)
Creatinine, Ser: 1.2 mg/dL (ref 0.4–1.2)
GFR: 48.2 mL/min — ABNORMAL LOW (ref >60.00)
Glucose, Bld: 66 mg/dL — ABNORMAL LOW (ref 70–99)
Potassium: 4.1 mEq/L (ref 3.5–5.1)
Sodium: 139 mEq/L (ref 135–145)

## 2013-11-26 MED ORDER — SODIUM CHLORIDE 0.9 % IR SOLN
80.0000 mg | Status: DC
  Filled 2013-11-26: qty 2

## 2013-11-26 MED ORDER — CEFAZOLIN SODIUM-DEXTROSE 2-3 GM-% IV SOLR
2.0000 g | INTRAVENOUS | Status: DC
  Filled 2013-11-26: qty 50

## 2013-11-26 NOTE — Addendum Note (Signed)
Addended by: Donalynn Furlong on: 11/26/2013 08:24 PM   Modules accepted: Level of Service

## 2013-11-27 ENCOUNTER — Encounter (HOSPITAL_COMMUNITY)
Admission: RE | Disposition: A | Discharge status: Home / Self Care | Payer: Self-pay | Source: Ambulatory Visit | Attending: Internal Medicine

## 2013-11-27 ENCOUNTER — Ambulatory Visit (HOSPITAL_COMMUNITY)
Admission: RE | Admit: 2013-11-27 | Discharge: 2013-11-27 | Disposition: A | Discharge status: Home / Self Care | Payer: Medicare Other | Source: Ambulatory Visit | Attending: Internal Medicine | Admitting: Internal Medicine

## 2013-11-27 DIAGNOSIS — I10 Essential (primary) hypertension: Secondary | ICD-10-CM | POA: Insufficient documentation

## 2013-11-27 DIAGNOSIS — I059 Rheumatic mitral valve disease, unspecified: Secondary | ICD-10-CM | POA: Diagnosis not present

## 2013-11-27 DIAGNOSIS — I421 Obstructive hypertrophic cardiomyopathy: Secondary | ICD-10-CM | POA: Diagnosis not present

## 2013-11-27 DIAGNOSIS — Z853 Personal history of malignant neoplasm of breast: Secondary | ICD-10-CM | POA: Insufficient documentation

## 2013-11-27 DIAGNOSIS — Z45018 Encounter for adjustment and management of other part of cardiac pacemaker: Secondary | ICD-10-CM | POA: Insufficient documentation

## 2013-11-27 DIAGNOSIS — J45909 Unspecified asthma, uncomplicated: Secondary | ICD-10-CM | POA: Diagnosis not present

## 2013-11-27 DIAGNOSIS — I442 Atrioventricular block, complete: Secondary | ICD-10-CM

## 2013-11-27 DIAGNOSIS — R32 Unspecified urinary incontinence: Secondary | ICD-10-CM | POA: Insufficient documentation

## 2013-11-27 DIAGNOSIS — Z79899 Other long term (current) drug therapy: Secondary | ICD-10-CM | POA: Diagnosis not present

## 2013-11-27 HISTORY — PX: PERMANENT PACEMAKER GENERATOR CHANGE: SHX6022

## 2013-11-27 LAB — SURGICAL PCR SCREEN
MRSA, PCR: NEGATIVE
Staphylococcus aureus: NEGATIVE

## 2013-11-27 SURGERY — PERMANENT PACEMAKER GENERATOR CHANGE
Anesthesia: LOCAL

## 2013-11-27 MED ORDER — FENTANYL CITRATE 0.05 MG/ML IJ SOLN
INTRAMUSCULAR | Status: AC
  Filled 2013-11-27: qty 2

## 2013-11-27 MED ORDER — ONDANSETRON HCL 4 MG/2ML IJ SOLN: 4.0000 mg | Freq: Four times a day (QID) | INTRAMUSCULAR | Status: DC | PRN

## 2013-11-27 MED ORDER — CHLORHEXIDINE GLUCONATE 4 % EX LIQD
60.0000 mL | Freq: Once | CUTANEOUS | Status: DC
  Filled 2013-11-27: qty 60

## 2013-11-27 MED ORDER — ACETAMINOPHEN 325 MG PO TABS
325.0000 mg | ORAL_TABLET | ORAL | Status: DC | PRN
  Filled 2013-11-27: qty 2

## 2013-11-27 MED ORDER — LIDOCAINE HCL (PF) 1 % IJ SOLN
INTRAMUSCULAR | Status: AC
  Filled 2013-11-27: qty 60

## 2013-11-27 MED ORDER — MIDAZOLAM HCL 5 MG/5ML IJ SOLN
INTRAMUSCULAR | Status: AC
  Filled 2013-11-27: qty 5

## 2013-11-27 MED ORDER — SODIUM CHLORIDE 0.9 % IV SOLN
INTRAVENOUS | Status: DC
  Administered 2013-11-27 (×2): via INTRAVENOUS

## 2013-11-27 MED ORDER — MUPIROCIN 2 % EX OINT
TOPICAL_OINTMENT | CUTANEOUS | Status: AC
  Filled 2013-11-27: qty 22

## 2013-11-27 MED ORDER — MUPIROCIN 2 % EX OINT
TOPICAL_OINTMENT | Freq: Two times a day (BID) | CUTANEOUS | Status: DC
  Administered 2013-11-27 (×2): 1 via NASAL
  Filled 2013-11-27: qty 22

## 2013-11-27 NOTE — Interval H&P Note (Signed)
History and Physical Interval Note: Patient seen and examined. Agree with above exam, assessment and plan.  11/27/2013 7:26 AM  [name]  has presented today for surgery, with the diagnosis of COMPLETE HEART BLOCK  The various methods of treatment have been discussed with the patient and family. After consideration of risks, benefits and other options for treatment, the patient has consented to  Procedure(s): PERMANENT PACEMAKER GENERATOR CHANGE (N/A) as a surgical intervention .  The patient's history has been reviewed, patient examined, no change in status, stable for surgery.  I have reviewed the patient's chart and labs.  Questions were answered to the patient's satisfaction.     [name].D.

## 2013-11-27 NOTE — CV Procedure (Signed)
Electrophysiology procedure note  Procedure: Pacemaker generator removal and insertion of a new pacemaker  Indication: Complete heart block, status post permanent pacemaker insertion with the old device at elective replacement  Findings: After informed consent was obtained, the patient was taken to the diagnostic electrophysiology laboratory in the fasting state. After the usual preparation and draping, intravenous fentanyl and Versed were given for sedation. 30 cc of lidocaine was infiltrated into the right infraclavicular region. A 5 cm incision was carried out over the old pacemaker insertion site. Electrocautery was utilized to dissect down to the pacemaker pocket. The generator was removed with gentle traction. The atrial lead was disconnected and evaluated and found to be working satisfactorily. The pacing impedance was 350 ohms. The underlying atrial rhythm is atrial fibrillation. The ventricular lead was also disconnected and found to be working satisfactorily. The ventricular pacing threshold was less than a volt at 0.5 ms. Ventricular pacing impedance was 450 ohms. The patient had complete heart block with no escape, and no R waves to be determined. The pacemaker pocket was irrigated with antibiotic irrigation. The new Medtronic Adapta L. dual-chamber pacemaker, serial number [id number], was connected to the old pacing leads in place back in the subcutaneous pocket. The pocket was irrigated with additional antibiotic irrigation, and the incision was closed with 2 layers of Vicryl suture. Benzoin and Steri-Strips for pain on the skin. The patient was returned to her room in satisfactory condition.  Complications: There are no immediate complications  Conclusion: This demonstrate successful pacemaker generator removal and insertion of a new device without immediate procedure complication.  Lewayne Bunting, M.D.

## 2013-11-27 NOTE — H&P (View-Only) (Signed)
HPI [name] returns today for followup. She is a very pleasant 79 year old woman with a history of complete heart block, status post permanent pacemaker insertion, hypertension, and arthritis. In the interim she has been stable except for pain with her right lower leg. She has reached elective replacement on her pacemaker. She denies chest pain, shortness of breath, or syncope. Allergies  Allergen Reactions  . Codeine   . Morphine   . Sulfonamide Derivatives      Current Outpatient Prescriptions  Medication Sig Dispense Refill  . albuterol (PROAIR HFA) 108 (90 BASE) MCG/ACT inhaler UAD       . alendronate (FOSAMAX) 70 MG tablet Take 70 mg by mouth every 7 (seven) days. Take with a full glass of water on an empty stomach.      Marland Kitchen amLODipine (NORVASC) 2.5 MG tablet Take 2.5 mg by mouth daily.      Marland Kitchen apixaban (ELIQUIS) 2.5 MG TABS tablet Take 1 tablet (2.5 mg total) by mouth 2 (two) times daily.  180 tablet  3  . Cholecalciferol (VITAMIN D3) 2000 UNITS capsule Take 2,000 Units by mouth daily.        . cyclobenzaprine (FLEXERIL) 5 MG tablet Take 5 mg by mouth daily.      . cycloSPORINE (RESTASIS) 0.05 % ophthalmic emulsion 1 drop 2 (two) times daily.        Marland Kitchen lidocaine (LIDODERM) 5 % Place 1 patch onto the skin daily. Remove & Discard patch within 12 hours or as directed by MD      . metoprolol (TOPROL-XL) 100 MG 24 hr tablet Take 100 mg by mouth daily.        . Multiple Vitamin (MULTIVITAMIN) tablet Take 1 tablet by mouth daily.        . traMADol (ULTRAM-ER) 300 MG 24 hr tablet Take 300 mg by mouth daily as needed. 1-2 tablets       No current facility-administered medications for this visit.     Past Medical History  Diagnosis Date  . Hypertension   . Mitral regurgitation   . Hypertrophic obstructive cardiomyopathy     Required septal myotomy  . Urinary incontinence   . Cystitis   . Hemorrhoids   . Bronchitis   . Asthma   . Anemia   . Heart block     Complete heart  block post surgical requiring pacer  . Hypokalemia     Postoperative, improved  . Hyponatremia     Mild postoperative, improved  . Blood transfusion   . Heart murmur   . Constipation   . Arthritis pain   . Cancer     lt breast    ROS:   All systems reviewed and negative except as noted in the HPI.   Past Surgical History  Procedure Laterality Date  . Anterior cervical diskectomy and fusion      C-7  . Septal myotomy    . Pacemaker placement    . Replacement total knee bilateral    . Femur fracture surgery      Right  . Wrist fracture surgery      Right  . Shoulder surgery      Right  . Breast biopsy      Left, negative  . Colonoscopy    . Abdominal hysterectomy    . Cardiomyopathy    . Breast lumpectomy  2012    left breast     Family History  Problem Relation Age of Onset  .  Heart disease Mother   . Hypertension Mother   . Pancreatic cancer Mother   . Cancer Mother     pancreatic  . Pancreatic cancer Father   . Cancer Father     pancreatic  . Diabetes Brother   . Pancreatic cancer Other   . Heart disease Brother     Enlarged heart     History   Social History  . Marital Status: Married    Spouse Name: N/A    Number of Children: N/A  . Years of Education: N/A   Occupational History  . Not on file.   Social History Main Topics  . Smoking status: Never Smoker   . Smokeless tobacco: Not on file  . Alcohol Use: No  . Drug Use: No  . Sexual Activity: Not Currently   Other Topics Concern  . Not on file   Social History Narrative   Married   Two children   Husband will be assisting with care after surgery     BP 130/74  Pulse 64  Ht 5' (1.524 m)  Wt 144 lb (65.318 kg)  BMI 28.12 kg/m2  Physical Exam:  Well appearing 79 year old woman, NAD HEENT: Unremarkable Neck:  No JVD, no thyromegally Back:  No CVA tenderness Lungs:  Clear with no wheezes, rales, or rhonchi. HEART:  Regular rate rhythm, no murmurs, no rubs, no  clicks Abd:  soft, positive bowel sounds, no organomegally, no rebound, no guarding Ext:  2 plus pulses, no edema, no cyanosis, no clubbing, right lower leg brace in place. Skin:  No rashes no nodules Neuro:  CN II through XII intact, motor grossly intact  DEVICE  Normal device function.  See PaceArt for details.   Assess/Plan:

## 2013-12-02 ENCOUNTER — Encounter (HOSPITAL_COMMUNITY): Payer: Self-pay | Admitting: *Deleted

## 2013-12-02 ENCOUNTER — Ambulatory Visit (INDEPENDENT_AMBULATORY_CARE_PROVIDER_SITE_OTHER): Payer: Medicare Other | Admitting: *Deleted

## 2013-12-02 ENCOUNTER — Encounter: Payer: Self-pay | Admitting: Internal Medicine

## 2013-12-02 ENCOUNTER — Telehealth: Payer: Self-pay | Admitting: Internal Medicine

## 2013-12-02 DIAGNOSIS — I495 Sick sinus syndrome: Secondary | ICD-10-CM

## 2013-12-02 NOTE — Telephone Encounter (Signed)
New problem ° ° °Pt having sob °

## 2013-12-02 NOTE — Telephone Encounter (Signed)
Spoke with the patient. She had generator change of her pacemaker on 11/27/13. On Friday 12/12 she began noticing SOB on exertion, sometimes while lying down, also feels faint/lightheaded while walking up stairs. Also reports feeling very tired since procedure. Spoke with Leonette Most in the device clinic about patient.  Informed him of her symptoms and also that she needs appointment for wound check. Informed patient that device clinic will call her this am, to make her an appointment to be seen.

## 2013-12-03 LAB — MDC_IDC_ENUM_SESS_TYPE_INCLINIC
Battery Impedance: 100 Ohm
Battery Remaining Longevity: 128 mo
Battery Voltage: 2.8 V
Brady Statistic AP VP Percent: 23 %
Brady Statistic AP VS Percent: 0 %
Brady Statistic AS VP Percent: 35 %
Brady Statistic AS VS Percent: 42 %
Date Time Interrogation Session: 20141215202641
Lead Channel Impedance Value: 453 Ohm
Lead Channel Impedance Value: 550 Ohm
Lead Channel Pacing Threshold Amplitude: 0.75 V
Lead Channel Pacing Threshold Pulse Width: 0.4 ms
Lead Channel Sensing Intrinsic Amplitude: 1 mV
Lead Channel Setting Pacing Amplitude: 2.5 V
Lead Channel Setting Pacing Amplitude: 2.5 V
Lead Channel Setting Pacing Pulse Width: 0.4 ms
Lead Channel Setting Sensing Sensitivity: 4 mV

## 2013-12-03 NOTE — Progress Notes (Signed)
Pt in for increased SOB since 11-29-13. Battery longevity 10.5 years. Pt in AF 100% of time. + Eliquis.  Previous checks show AF 2-4%.No ventricular high rate episodes. Pt had gen change on 11-27-13. Check to be showed to GT. ROV 12-05-13 @ 1400 for wound check.

## 2013-12-05 ENCOUNTER — Encounter: Payer: Self-pay | Admitting: Internal Medicine

## 2013-12-05 ENCOUNTER — Ambulatory Visit (INDEPENDENT_AMBULATORY_CARE_PROVIDER_SITE_OTHER): Payer: Medicare Other | Admitting: *Deleted

## 2013-12-05 DIAGNOSIS — I4891 Unspecified atrial fibrillation: Secondary | ICD-10-CM | POA: Diagnosis not present

## 2013-12-05 DIAGNOSIS — I442 Atrioventricular block, complete: Secondary | ICD-10-CM | POA: Diagnosis not present

## 2013-12-05 LAB — MDC_IDC_ENUM_SESS_TYPE_INCLINIC
Battery Impedance: 100 Ohm
Battery Remaining Longevity: 128 mo
Battery Voltage: 2.8 V
Brady Statistic AP VP Percent: 54 %
Brady Statistic AP VS Percent: 0 %
Brady Statistic AS VP Percent: 44 %
Brady Statistic AS VS Percent: 2 %
Date Time Interrogation Session: 20141218152526
Lead Channel Impedance Value: 454 Ohm
Lead Channel Impedance Value: 552 Ohm
Lead Channel Pacing Threshold Amplitude: 0.75 V
Lead Channel Pacing Threshold Pulse Width: 0.4 ms
Lead Channel Sensing Intrinsic Amplitude: 0.5 mV
Lead Channel Setting Pacing Amplitude: 2.5 V
Lead Channel Setting Pacing Amplitude: 2.5 V
Lead Channel Setting Pacing Pulse Width: 0.4 ms
Lead Channel Setting Sensing Sensitivity: 4 mV

## 2013-12-05 NOTE — Progress Notes (Signed)
Wound check appointment. Steri-strips removed. Wound without redness or edema. Incision edges approximated, wound well healed. Normal device function. Thresholds, sensing, and impedances consistent with implant measurements. Device programmed at 3.5V/auto capture programmed on for extra safety margin until 3 month visit. Histogram distribution blunted, and reprogrammed med/low->low per Dr.Taylor.  100% A-fib, + eliquis.  No high ventricular rates noted. Patient educated about wound care, arm mobility, lifting restrictions. ROV in 3 months with implanting physician.

## 2013-12-09 ENCOUNTER — Emergency Department: Payer: Self-pay | Admitting: Emergency Medicine

## 2013-12-09 DIAGNOSIS — S82209A Unspecified fracture of shaft of unspecified tibia, initial encounter for closed fracture: Secondary | ICD-10-CM | POA: Diagnosis not present

## 2013-12-09 DIAGNOSIS — S82899A Other fracture of unspecified lower leg, initial encounter for closed fracture: Secondary | ICD-10-CM | POA: Diagnosis not present

## 2013-12-09 DIAGNOSIS — Z950 Presence of cardiac pacemaker: Secondary | ICD-10-CM | POA: Diagnosis not present

## 2013-12-09 DIAGNOSIS — S8263XA Displaced fracture of lateral malleolus of unspecified fibula, initial encounter for closed fracture: Secondary | ICD-10-CM | POA: Diagnosis not present

## 2013-12-10 DIAGNOSIS — S82209A Unspecified fracture of shaft of unspecified tibia, initial encounter for closed fracture: Secondary | ICD-10-CM | POA: Diagnosis not present

## 2013-12-10 DIAGNOSIS — Z96659 Presence of unspecified artificial knee joint: Secondary | ICD-10-CM | POA: Diagnosis not present

## 2013-12-24 DIAGNOSIS — S82209A Unspecified fracture of shaft of unspecified tibia, initial encounter for closed fracture: Secondary | ICD-10-CM | POA: Diagnosis not present

## 2014-01-07 DIAGNOSIS — T148XXA Other injury of unspecified body region, initial encounter: Secondary | ICD-10-CM | POA: Diagnosis not present

## 2014-01-07 DIAGNOSIS — T84049A Periprosthetic fracture around unspecified internal prosthetic joint, initial encounter: Secondary | ICD-10-CM | POA: Diagnosis not present

## 2014-01-07 DIAGNOSIS — IMO0002 Reserved for concepts with insufficient information to code with codable children: Secondary | ICD-10-CM | POA: Diagnosis not present

## 2014-01-07 DIAGNOSIS — Z9889 Other specified postprocedural states: Secondary | ICD-10-CM | POA: Diagnosis not present

## 2014-01-07 DIAGNOSIS — Z9660 Presence of unspecified orthopedic joint implant: Secondary | ICD-10-CM | POA: Diagnosis not present

## 2014-01-07 DIAGNOSIS — S82899A Other fracture of unspecified lower leg, initial encounter for closed fracture: Secondary | ICD-10-CM | POA: Diagnosis not present

## 2014-01-16 ENCOUNTER — Encounter: Payer: Self-pay | Admitting: Internal Medicine

## 2014-01-16 ENCOUNTER — Ambulatory Visit (INDEPENDENT_AMBULATORY_CARE_PROVIDER_SITE_OTHER): Payer: Medicare Other | Admitting: Internal Medicine

## 2014-01-16 VITALS — BP 128/80 | HR 81 | Ht 60.0 in

## 2014-01-16 DIAGNOSIS — I442 Atrioventricular block, complete: Secondary | ICD-10-CM | POA: Diagnosis not present

## 2014-01-16 DIAGNOSIS — Z950 Presence of cardiac pacemaker: Secondary | ICD-10-CM | POA: Diagnosis not present

## 2014-01-16 DIAGNOSIS — I4891 Unspecified atrial fibrillation: Secondary | ICD-10-CM | POA: Diagnosis not present

## 2014-01-16 DIAGNOSIS — I10 Essential (primary) hypertension: Secondary | ICD-10-CM | POA: Diagnosis not present

## 2014-01-16 DIAGNOSIS — Z01810 Encounter for preprocedural cardiovascular examination: Secondary | ICD-10-CM

## 2014-01-16 LAB — MDC_IDC_ENUM_SESS_TYPE_INCLINIC
Battery Impedance: 100 Ohm
Battery Remaining Longevity: 128 mo
Battery Voltage: 2.8 V
Brady Statistic AP VP Percent: 37 %
Brady Statistic AP VS Percent: 0 %
Brady Statistic AS VP Percent: 57 %
Brady Statistic AS VS Percent: 6 %
Date Time Interrogation Session: 20150129172324
Lead Channel Impedance Value: 473 Ohm
Lead Channel Impedance Value: 562 Ohm
Lead Channel Pacing Threshold Amplitude: 0.5 V
Lead Channel Pacing Threshold Pulse Width: 0.4 ms
Lead Channel Sensing Intrinsic Amplitude: 0.7 mV
Lead Channel Setting Pacing Amplitude: 2.5 V
Lead Channel Setting Pacing Amplitude: 2.5 V
Lead Channel Setting Pacing Pulse Width: 0.4 ms
Lead Channel Setting Sensing Sensitivity: 4 mV

## 2014-01-16 NOTE — Progress Notes (Signed)
HPI [name] returns today for followup. She is a very pleasant 80 year old woman with a history of complete heart block, status post permanent pacemaker insertion, hypertension, and arthritis. In the interim she has been stable except for pain with her right lower leg. She has undergone pacemaker generator change out, complicated by a hematoma which has resolved. She denies chest pain, shortness of breath, or syncope. She has previously had preserved LV function but has not had an echo in several years. Her CHF symptoms are class 1 but she is limited by a sedentary lifestyle.  Allergies  Allergen Reactions  . Codeine Nausea And Vomiting  . Morphine Nausea And Vomiting     Current Outpatient Prescriptions  Medication Sig Dispense Refill  . albuterol (PROAIR HFA) 108 (90 BASE) MCG/ACT inhaler Inhale 2 puffs into the lungs every 6 (six) hours as needed for wheezing.       Marland Kitchen alendronate (FOSAMAX) 70 MG tablet Take 70 mg by mouth every 7 (seven) days. Take with a full glass of water on an empty stomach. Takes on Tuesdays      . amLODipine (NORVASC) 2.5 MG tablet Take 2.5 mg by mouth daily.      Marland Kitchen apixaban (ELIQUIS) 2.5 MG TABS tablet Take 2.5 mg by mouth 2 (two) times daily.      . calcium citrate-vitamin D (CITRACAL+D) 315-200 MG-UNIT per tablet Take 1 tablet by mouth 2 (two) times daily.      . cyclobenzaprine (FLEXERIL) 5 MG tablet Take 5 mg by mouth daily.      . cycloSPORINE (RESTASIS) 0.05 % ophthalmic emulsion Place 1 drop into both eyes 2 (two) times daily.       . meloxicam (MOBIC) 15 MG tablet Take 15 mg by mouth daily.      . metoprolol (TOPROL-XL) 100 MG 24 hr tablet Take 100 mg by mouth daily.       . Multiple Vitamin (MULTIVITAMIN) tablet Take 1 tablet by mouth daily.       . traMADol (ULTRAM-ER) 300 MG 24 hr tablet Take 600 mg by mouth every 4 (four) hours as needed (for knee pain).        No current facility-administered medications for this visit.     Past Medical  History  Diagnosis Date  . Hypertension   . Mitral regurgitation   . Hypertrophic obstructive cardiomyopathy     Required septal myotomy  . Urinary incontinence   . Cystitis   . Hemorrhoids   . Bronchitis   . Asthma   . Anemia   . Heart block     Complete heart block post surgical requiring pacer  . Hypokalemia     Postoperative, improved  . Hyponatremia     Mild postoperative, improved  . Blood transfusion   . Constipation   . Arthritis pain   . Cancer     lt breast    ROS:   All systems reviewed and negative except as noted in the HPI.   Past Surgical History  Procedure Laterality Date  . Anterior cervical diskectomy and fusion      C-7  . Septal myotomy    . Pacemaker placement  2006/11-27-2013    MDT dual chamber pacemaker implanted 2006 with gen change by Dr Lovena Le 11-27-2013  . Replacement total knee bilateral    . Femur fracture surgery      Right  . Wrist fracture surgery      Right  . Shoulder surgery  Right  . Breast biopsy      Left, negative  . Colonoscopy    . Abdominal hysterectomy    . Cardiomyopathy    . Breast lumpectomy  2012    left breast     Family History  Problem Relation Age of Onset  . Heart disease Mother   . Hypertension Mother   . Pancreatic cancer Mother   . Cancer Mother     pancreatic  . Pancreatic cancer Father   . Cancer Father     pancreatic  . Diabetes Brother   . Pancreatic cancer Other   . Heart disease Brother     Enlarged heart     History   Social History  . Marital Status: Married    Spouse Name: N/A    Number of Children: N/A  . Years of Education: N/A   Occupational History  . Not on file.   Social History Main Topics  . Smoking status: Never Smoker   . Smokeless tobacco: Not on file  . Alcohol Use: No  . Drug Use: No  . Sexual Activity: Not Currently   Other Topics Concern  . Not on file   Social History Narrative   Married   Two children   Husband will be assisting with care  after surgery     BP 128/80  Pulse 81  Ht 5' (1.524 m)  Physical Exam:  Well appearing 80 year old woman, NAD HEENT: Unremarkable Neck:  No JVD, no thyromegally Back:  No CVA tenderness Lungs:  Clear with no wheezes, rales, or rhonchi. HEART:  Regular rate rhythm, no murmurs, no rubs, no clicks Abd:  soft, positive bowel sounds, no organomegally, no rebound, no guarding Ext:  2 plus pulses, no edema, no cyanosis, no clubbing, right lower leg brace in place. Skin:  No rashes no nodules Neuro:  CN II through XII intact, motor grossly intact  DEVICE  Normal device function.  See PaceArt for details.   Assess/Plan:

## 2014-01-16 NOTE — Assessment & Plan Note (Signed)
She has been chronically in atrial fib and her rate is well controlled. She is on low dose Eliqus.

## 2014-01-16 NOTE — Addendum Note (Signed)
Addended by: Janan Halter F on: 01/16/2014 01:17 PM   Modules accepted: Orders

## 2014-01-16 NOTE — Assessment & Plan Note (Signed)
Her risk is overall low. Her description of her pending surgical procedure sounds like a fairly big operation. I will ask her to undergo 2D echo for additional risk stratification.

## 2014-01-16 NOTE — Assessment & Plan Note (Signed)
Her Medtronic PPM is working normally. Will recheck in several months.  

## 2014-01-16 NOTE — Patient Instructions (Addendum)
Your physician wants you to follow-up in: Dec 2015 with Dr Knox Saliva will receive a reminder letter in the mail two months in advance. If you don't receive a letter, please call our office to schedule the follow-up appointment.   Remote monitoring is used to monitor your Pacemaker or ICD from home. This monitoring reduces the number of office visits required to check your device to one time per year. It allows [place] to keep an eye on the functioning of your device to ensure it is working properly. You are scheduled for a device check from home on 04/22/14. You may send your transmission at any time that day. If you have a wireless device, the transmission will be sent automatically. After your physician reviews your transmission, you will receive a postcard with your next transmission date.  Your physician has requested that you have an echocardiogram. Echocardiography is a painless test that uses sound waves to create images of your heart. It provides your doctor with information about the size and shape of your heart and how well your heart's chambers and valves are working. This procedure takes approximately one hour. There are no restrictions for this procedure.

## 2014-01-16 NOTE — Assessment & Plan Note (Signed)
Her blood pressure is well controlled. She will continue her current meds.  

## 2014-01-17 DIAGNOSIS — I421 Obstructive hypertrophic cardiomyopathy: Secondary | ICD-10-CM | POA: Diagnosis not present

## 2014-01-17 DIAGNOSIS — I10 Essential (primary) hypertension: Secondary | ICD-10-CM | POA: Insufficient documentation

## 2014-01-17 DIAGNOSIS — Z950 Presence of cardiac pacemaker: Secondary | ICD-10-CM | POA: Insufficient documentation

## 2014-01-17 DIAGNOSIS — M8448XD Pathological fracture, other site, subsequent encounter for fracture with routine healing: Secondary | ICD-10-CM | POA: Diagnosis not present

## 2014-01-17 DIAGNOSIS — Z8679 Personal history of other diseases of the circulatory system: Secondary | ICD-10-CM | POA: Diagnosis not present

## 2014-01-20 ENCOUNTER — Telehealth: Payer: Self-pay | Admitting: *Deleted

## 2014-01-20 DIAGNOSIS — M8448XD Pathological fracture, other site, subsequent encounter for fracture with routine healing: Secondary | ICD-10-CM | POA: Diagnosis not present

## 2014-01-20 DIAGNOSIS — I421 Obstructive hypertrophic cardiomyopathy: Secondary | ICD-10-CM | POA: Diagnosis not present

## 2014-01-20 NOTE — Telephone Encounter (Signed)
Discussed with Dr Lovena Le Friday 01/17/14.  He says may hold Eliquis for 2 days prior to procedure and restart immediately after procedure.  I will have this faxed to the MD doing surgery.  The NP had called on Fri but I had already left office when Dr Lovena Le returned my call

## 2014-01-21 ENCOUNTER — Other Ambulatory Visit (HOSPITAL_COMMUNITY): Payer: Medicare Other

## 2014-01-22 DIAGNOSIS — M069 Rheumatoid arthritis, unspecified: Secondary | ICD-10-CM | POA: Diagnosis present

## 2014-01-22 DIAGNOSIS — I959 Hypotension, unspecified: Secondary | ICD-10-CM | POA: Diagnosis not present

## 2014-01-22 DIAGNOSIS — J189 Pneumonia, unspecified organism: Secondary | ICD-10-CM | POA: Diagnosis not present

## 2014-01-22 DIAGNOSIS — R1312 Dysphagia, oropharyngeal phase: Secondary | ICD-10-CM | POA: Diagnosis not present

## 2014-01-22 DIAGNOSIS — J90 Pleural effusion, not elsewhere classified: Secondary | ICD-10-CM | POA: Diagnosis not present

## 2014-01-22 DIAGNOSIS — Z8781 Personal history of (healed) traumatic fracture: Secondary | ICD-10-CM | POA: Diagnosis not present

## 2014-01-22 DIAGNOSIS — M79609 Pain in unspecified limb: Secondary | ICD-10-CM | POA: Diagnosis not present

## 2014-01-22 DIAGNOSIS — I421 Obstructive hypertrophic cardiomyopathy: Secondary | ICD-10-CM | POA: Diagnosis not present

## 2014-01-22 DIAGNOSIS — T84049A Periprosthetic fracture around unspecified internal prosthetic joint, initial encounter: Secondary | ICD-10-CM | POA: Diagnosis present

## 2014-01-22 DIAGNOSIS — R0902 Hypoxemia: Secondary | ICD-10-CM | POA: Diagnosis not present

## 2014-01-22 DIAGNOSIS — R630 Anorexia: Secondary | ICD-10-CM | POA: Diagnosis not present

## 2014-01-22 DIAGNOSIS — T84039A Mechanical loosening of unspecified internal prosthetic joint, initial encounter: Secondary | ICD-10-CM | POA: Diagnosis not present

## 2014-01-22 DIAGNOSIS — M7989 Other specified soft tissue disorders: Secondary | ICD-10-CM | POA: Diagnosis not present

## 2014-01-22 DIAGNOSIS — Z5189 Encounter for other specified aftercare: Secondary | ICD-10-CM | POA: Diagnosis not present

## 2014-01-22 DIAGNOSIS — T84099A Other mechanical complication of unspecified internal joint prosthesis, initial encounter: Secondary | ICD-10-CM | POA: Diagnosis not present

## 2014-01-22 DIAGNOSIS — Z9660 Presence of unspecified orthopedic joint implant: Secondary | ICD-10-CM | POA: Diagnosis not present

## 2014-01-22 DIAGNOSIS — Z96659 Presence of unspecified artificial knee joint: Secondary | ICD-10-CM | POA: Diagnosis not present

## 2014-01-22 DIAGNOSIS — R279 Unspecified lack of coordination: Secondary | ICD-10-CM | POA: Diagnosis not present

## 2014-01-22 DIAGNOSIS — M8448XD Pathological fracture, other site, subsequent encounter for fracture with routine healing: Secondary | ICD-10-CM | POA: Diagnosis not present

## 2014-01-22 DIAGNOSIS — Z853 Personal history of malignant neoplasm of breast: Secondary | ICD-10-CM | POA: Diagnosis not present

## 2014-01-22 DIAGNOSIS — I4891 Unspecified atrial fibrillation: Secondary | ICD-10-CM | POA: Diagnosis present

## 2014-01-22 DIAGNOSIS — G8918 Other acute postprocedural pain: Secondary | ICD-10-CM | POA: Diagnosis not present

## 2014-01-22 DIAGNOSIS — J45909 Unspecified asthma, uncomplicated: Secondary | ICD-10-CM | POA: Diagnosis present

## 2014-01-22 DIAGNOSIS — R918 Other nonspecific abnormal finding of lung field: Secondary | ICD-10-CM | POA: Diagnosis not present

## 2014-01-22 DIAGNOSIS — R4182 Altered mental status, unspecified: Secondary | ICD-10-CM | POA: Diagnosis not present

## 2014-01-22 DIAGNOSIS — D649 Anemia, unspecified: Secondary | ICD-10-CM | POA: Diagnosis not present

## 2014-01-22 DIAGNOSIS — J9819 Other pulmonary collapse: Secondary | ICD-10-CM | POA: Diagnosis not present

## 2014-01-22 DIAGNOSIS — Z8679 Personal history of other diseases of the circulatory system: Secondary | ICD-10-CM | POA: Diagnosis not present

## 2014-01-22 DIAGNOSIS — I10 Essential (primary) hypertension: Secondary | ICD-10-CM | POA: Diagnosis not present

## 2014-01-22 DIAGNOSIS — R0609 Other forms of dyspnea: Secondary | ICD-10-CM | POA: Diagnosis not present

## 2014-01-22 DIAGNOSIS — M19079 Primary osteoarthritis, unspecified ankle and foot: Secondary | ICD-10-CM | POA: Diagnosis not present

## 2014-01-22 DIAGNOSIS — G9340 Encephalopathy, unspecified: Secondary | ICD-10-CM | POA: Diagnosis not present

## 2014-01-22 DIAGNOSIS — E46 Unspecified protein-calorie malnutrition: Secondary | ICD-10-CM | POA: Diagnosis present

## 2014-01-22 DIAGNOSIS — R6510 Systemic inflammatory response syndrome (SIRS) of non-infectious origin without acute organ dysfunction: Secondary | ICD-10-CM | POA: Diagnosis not present

## 2014-01-22 DIAGNOSIS — J811 Chronic pulmonary edema: Secondary | ICD-10-CM | POA: Diagnosis not present

## 2014-01-22 DIAGNOSIS — Z950 Presence of cardiac pacemaker: Secondary | ICD-10-CM | POA: Diagnosis not present

## 2014-02-01 DIAGNOSIS — Z8781 Personal history of (healed) traumatic fracture: Secondary | ICD-10-CM | POA: Diagnosis not present

## 2014-02-01 DIAGNOSIS — Z950 Presence of cardiac pacemaker: Secondary | ICD-10-CM | POA: Diagnosis not present

## 2014-02-01 DIAGNOSIS — I421 Obstructive hypertrophic cardiomyopathy: Secondary | ICD-10-CM | POA: Diagnosis present

## 2014-02-01 DIAGNOSIS — E46 Unspecified protein-calorie malnutrition: Secondary | ICD-10-CM | POA: Diagnosis not present

## 2014-02-01 DIAGNOSIS — I059 Rheumatic mitral valve disease, unspecified: Secondary | ICD-10-CM | POA: Diagnosis present

## 2014-02-01 DIAGNOSIS — I10 Essential (primary) hypertension: Secondary | ICD-10-CM | POA: Diagnosis not present

## 2014-02-01 DIAGNOSIS — M810 Age-related osteoporosis without current pathological fracture: Secondary | ICD-10-CM | POA: Diagnosis present

## 2014-02-01 DIAGNOSIS — R279 Unspecified lack of coordination: Secondary | ICD-10-CM | POA: Diagnosis not present

## 2014-02-01 DIAGNOSIS — D649 Anemia, unspecified: Secondary | ICD-10-CM | POA: Diagnosis not present

## 2014-02-01 DIAGNOSIS — T8140XA Infection following a procedure, unspecified, initial encounter: Secondary | ICD-10-CM | POA: Diagnosis not present

## 2014-02-01 DIAGNOSIS — IMO0002 Reserved for concepts with insufficient information to code with codable children: Secondary | ICD-10-CM | POA: Diagnosis present

## 2014-02-01 DIAGNOSIS — R609 Edema, unspecified: Secondary | ICD-10-CM | POA: Diagnosis not present

## 2014-02-01 DIAGNOSIS — T84099A Other mechanical complication of unspecified internal joint prosthesis, initial encounter: Secondary | ICD-10-CM | POA: Diagnosis not present

## 2014-02-01 DIAGNOSIS — Z5189 Encounter for other specified aftercare: Secondary | ICD-10-CM | POA: Diagnosis not present

## 2014-02-01 DIAGNOSIS — S82209A Unspecified fracture of shaft of unspecified tibia, initial encounter for closed fracture: Secondary | ICD-10-CM | POA: Diagnosis not present

## 2014-02-01 DIAGNOSIS — M069 Rheumatoid arthritis, unspecified: Secondary | ICD-10-CM | POA: Diagnosis present

## 2014-02-01 DIAGNOSIS — Z471 Aftercare following joint replacement surgery: Secondary | ICD-10-CM | POA: Diagnosis not present

## 2014-02-01 DIAGNOSIS — G8918 Other acute postprocedural pain: Secondary | ICD-10-CM | POA: Diagnosis not present

## 2014-02-01 DIAGNOSIS — R6889 Other general symptoms and signs: Secondary | ICD-10-CM | POA: Diagnosis not present

## 2014-02-01 DIAGNOSIS — K5900 Constipation, unspecified: Secondary | ICD-10-CM | POA: Diagnosis not present

## 2014-02-01 DIAGNOSIS — M5030 Other cervical disc degeneration, unspecified cervical region: Secondary | ICD-10-CM | POA: Diagnosis present

## 2014-02-01 DIAGNOSIS — Z853 Personal history of malignant neoplasm of breast: Secondary | ICD-10-CM | POA: Diagnosis not present

## 2014-02-01 DIAGNOSIS — T84049A Periprosthetic fracture around unspecified internal prosthetic joint, initial encounter: Secondary | ICD-10-CM | POA: Diagnosis not present

## 2014-02-01 DIAGNOSIS — Z9660 Presence of unspecified orthopedic joint implant: Secondary | ICD-10-CM | POA: Diagnosis not present

## 2014-02-01 DIAGNOSIS — Z96659 Presence of unspecified artificial knee joint: Secondary | ICD-10-CM | POA: Diagnosis not present

## 2014-02-02 DIAGNOSIS — E46 Unspecified protein-calorie malnutrition: Secondary | ICD-10-CM | POA: Diagnosis not present

## 2014-02-02 DIAGNOSIS — S82209A Unspecified fracture of shaft of unspecified tibia, initial encounter for closed fracture: Secondary | ICD-10-CM | POA: Diagnosis not present

## 2014-02-02 DIAGNOSIS — D649 Anemia, unspecified: Secondary | ICD-10-CM | POA: Diagnosis not present

## 2014-02-02 DIAGNOSIS — K5900 Constipation, unspecified: Secondary | ICD-10-CM | POA: Diagnosis not present

## 2014-02-02 DIAGNOSIS — R609 Edema, unspecified: Secondary | ICD-10-CM | POA: Diagnosis not present

## 2014-02-04 DIAGNOSIS — E46 Unspecified protein-calorie malnutrition: Secondary | ICD-10-CM | POA: Diagnosis not present

## 2014-02-04 DIAGNOSIS — K5900 Constipation, unspecified: Secondary | ICD-10-CM | POA: Diagnosis not present

## 2014-02-04 DIAGNOSIS — D649 Anemia, unspecified: Secondary | ICD-10-CM | POA: Diagnosis not present

## 2014-02-04 DIAGNOSIS — S82209A Unspecified fracture of shaft of unspecified tibia, initial encounter for closed fracture: Secondary | ICD-10-CM | POA: Diagnosis not present

## 2014-02-04 DIAGNOSIS — R609 Edema, unspecified: Secondary | ICD-10-CM | POA: Diagnosis not present

## 2014-02-06 ENCOUNTER — Encounter: Payer: Self-pay | Admitting: Internal Medicine

## 2014-02-15 DIAGNOSIS — M810 Age-related osteoporosis without current pathological fracture: Secondary | ICD-10-CM | POA: Diagnosis present

## 2014-02-15 DIAGNOSIS — M948X9 Other specified disorders of cartilage, unspecified sites: Secondary | ICD-10-CM | POA: Diagnosis not present

## 2014-02-15 DIAGNOSIS — G8918 Other acute postprocedural pain: Secondary | ICD-10-CM | POA: Diagnosis not present

## 2014-02-15 DIAGNOSIS — I10 Essential (primary) hypertension: Secondary | ICD-10-CM | POA: Diagnosis not present

## 2014-02-15 DIAGNOSIS — Z853 Personal history of malignant neoplasm of breast: Secondary | ICD-10-CM | POA: Diagnosis not present

## 2014-02-15 DIAGNOSIS — Z5189 Encounter for other specified aftercare: Secondary | ICD-10-CM | POA: Diagnosis not present

## 2014-02-15 DIAGNOSIS — Z7901 Long term (current) use of anticoagulants: Secondary | ICD-10-CM | POA: Diagnosis not present

## 2014-02-15 DIAGNOSIS — I4891 Unspecified atrial fibrillation: Secondary | ICD-10-CM | POA: Diagnosis not present

## 2014-02-15 DIAGNOSIS — T8140XA Infection following a procedure, unspecified, initial encounter: Secondary | ICD-10-CM | POA: Diagnosis not present

## 2014-02-15 DIAGNOSIS — Z8781 Personal history of (healed) traumatic fracture: Secondary | ICD-10-CM | POA: Diagnosis not present

## 2014-02-15 DIAGNOSIS — I059 Rheumatic mitral valve disease, unspecified: Secondary | ICD-10-CM | POA: Diagnosis not present

## 2014-02-15 DIAGNOSIS — M069 Rheumatoid arthritis, unspecified: Secondary | ICD-10-CM | POA: Diagnosis present

## 2014-02-15 DIAGNOSIS — Z96659 Presence of unspecified artificial knee joint: Secondary | ICD-10-CM | POA: Diagnosis not present

## 2014-02-15 DIAGNOSIS — T84099A Other mechanical complication of unspecified internal joint prosthesis, initial encounter: Secondary | ICD-10-CM | POA: Diagnosis not present

## 2014-02-15 DIAGNOSIS — Z950 Presence of cardiac pacemaker: Secondary | ICD-10-CM | POA: Diagnosis not present

## 2014-02-15 DIAGNOSIS — Z471 Aftercare following joint replacement surgery: Secondary | ICD-10-CM | POA: Diagnosis not present

## 2014-02-15 DIAGNOSIS — R6889 Other general symptoms and signs: Secondary | ICD-10-CM | POA: Diagnosis not present

## 2014-02-15 DIAGNOSIS — IMO0002 Reserved for concepts with insufficient information to code with codable children: Secondary | ICD-10-CM | POA: Diagnosis not present

## 2014-02-15 DIAGNOSIS — M5030 Other cervical disc degeneration, unspecified cervical region: Secondary | ICD-10-CM | POA: Diagnosis present

## 2014-02-15 DIAGNOSIS — T84049A Periprosthetic fracture around unspecified internal prosthetic joint, initial encounter: Secondary | ICD-10-CM | POA: Diagnosis not present

## 2014-02-15 DIAGNOSIS — S7290XA Unspecified fracture of unspecified femur, initial encounter for closed fracture: Secondary | ICD-10-CM | POA: Diagnosis not present

## 2014-02-15 DIAGNOSIS — I421 Obstructive hypertrophic cardiomyopathy: Secondary | ICD-10-CM | POA: Diagnosis present

## 2014-02-17 DIAGNOSIS — K5900 Constipation, unspecified: Secondary | ICD-10-CM | POA: Diagnosis not present

## 2014-02-17 DIAGNOSIS — T889XXS Complication of surgical and medical care, unspecified, sequela: Secondary | ICD-10-CM | POA: Diagnosis not present

## 2014-02-17 DIAGNOSIS — T84049A Periprosthetic fracture around unspecified internal prosthetic joint, initial encounter: Secondary | ICD-10-CM | POA: Diagnosis not present

## 2014-02-17 DIAGNOSIS — I2789 Other specified pulmonary heart diseases: Secondary | ICD-10-CM | POA: Diagnosis present

## 2014-02-17 DIAGNOSIS — J90 Pleural effusion, not elsewhere classified: Secondary | ICD-10-CM | POA: Diagnosis not present

## 2014-02-17 DIAGNOSIS — Z950 Presence of cardiac pacemaker: Secondary | ICD-10-CM | POA: Diagnosis not present

## 2014-02-17 DIAGNOSIS — M948X9 Other specified disorders of cartilage, unspecified sites: Secondary | ICD-10-CM | POA: Diagnosis not present

## 2014-02-17 DIAGNOSIS — S7290XA Unspecified fracture of unspecified femur, initial encounter for closed fracture: Secondary | ICD-10-CM | POA: Diagnosis not present

## 2014-02-17 DIAGNOSIS — T8450XA Infection and inflammatory reaction due to unspecified internal joint prosthesis, initial encounter: Secondary | ICD-10-CM | POA: Diagnosis present

## 2014-02-17 DIAGNOSIS — R0602 Shortness of breath: Secondary | ICD-10-CM | POA: Diagnosis not present

## 2014-02-17 DIAGNOSIS — J45909 Unspecified asthma, uncomplicated: Secondary | ICD-10-CM | POA: Diagnosis present

## 2014-02-17 DIAGNOSIS — S82209A Unspecified fracture of shaft of unspecified tibia, initial encounter for closed fracture: Secondary | ICD-10-CM | POA: Diagnosis not present

## 2014-02-17 DIAGNOSIS — I10 Essential (primary) hypertension: Secondary | ICD-10-CM | POA: Diagnosis not present

## 2014-02-17 DIAGNOSIS — Z5189 Encounter for other specified aftercare: Secondary | ICD-10-CM | POA: Diagnosis not present

## 2014-02-17 DIAGNOSIS — R609 Edema, unspecified: Secondary | ICD-10-CM | POA: Diagnosis not present

## 2014-02-17 DIAGNOSIS — I421 Obstructive hypertrophic cardiomyopathy: Secondary | ICD-10-CM | POA: Diagnosis present

## 2014-02-17 DIAGNOSIS — I4581 Long QT syndrome: Secondary | ICD-10-CM | POA: Diagnosis not present

## 2014-02-17 DIAGNOSIS — I5032 Chronic diastolic (congestive) heart failure: Secondary | ICD-10-CM | POA: Diagnosis not present

## 2014-02-17 DIAGNOSIS — M810 Age-related osteoporosis without current pathological fracture: Secondary | ICD-10-CM | POA: Diagnosis present

## 2014-02-17 DIAGNOSIS — T8132XA Disruption of internal operation (surgical) wound, not elsewhere classified, initial encounter: Secondary | ICD-10-CM | POA: Diagnosis present

## 2014-02-17 DIAGNOSIS — Z7901 Long term (current) use of anticoagulants: Secondary | ICD-10-CM | POA: Diagnosis not present

## 2014-02-17 DIAGNOSIS — D649 Anemia, unspecified: Secondary | ICD-10-CM | POA: Diagnosis not present

## 2014-02-17 DIAGNOSIS — E8779 Other fluid overload: Secondary | ICD-10-CM | POA: Diagnosis not present

## 2014-02-17 DIAGNOSIS — Z8781 Personal history of (healed) traumatic fracture: Secondary | ICD-10-CM | POA: Diagnosis not present

## 2014-02-17 DIAGNOSIS — I509 Heart failure, unspecified: Secondary | ICD-10-CM | POA: Diagnosis not present

## 2014-02-17 DIAGNOSIS — Z96659 Presence of unspecified artificial knee joint: Secondary | ICD-10-CM | POA: Diagnosis not present

## 2014-02-17 DIAGNOSIS — E46 Unspecified protein-calorie malnutrition: Secondary | ICD-10-CM | POA: Diagnosis not present

## 2014-02-17 DIAGNOSIS — I4891 Unspecified atrial fibrillation: Secondary | ICD-10-CM | POA: Diagnosis present

## 2014-02-17 DIAGNOSIS — C50919 Malignant neoplasm of unspecified site of unspecified female breast: Secondary | ICD-10-CM | POA: Diagnosis present

## 2014-02-17 DIAGNOSIS — T84099A Other mechanical complication of unspecified internal joint prosthesis, initial encounter: Secondary | ICD-10-CM | POA: Diagnosis not present

## 2014-02-17 DIAGNOSIS — M069 Rheumatoid arthritis, unspecified: Secondary | ICD-10-CM | POA: Diagnosis present

## 2014-02-17 DIAGNOSIS — G8918 Other acute postprocedural pain: Secondary | ICD-10-CM | POA: Diagnosis not present

## 2014-02-17 DIAGNOSIS — T8131XA Disruption of external operation (surgical) wound, not elsewhere classified, initial encounter: Secondary | ICD-10-CM | POA: Diagnosis not present

## 2014-02-18 DIAGNOSIS — R609 Edema, unspecified: Secondary | ICD-10-CM | POA: Diagnosis not present

## 2014-02-18 DIAGNOSIS — D649 Anemia, unspecified: Secondary | ICD-10-CM | POA: Diagnosis not present

## 2014-02-18 DIAGNOSIS — K5900 Constipation, unspecified: Secondary | ICD-10-CM | POA: Diagnosis not present

## 2014-02-18 DIAGNOSIS — S82209A Unspecified fracture of shaft of unspecified tibia, initial encounter for closed fracture: Secondary | ICD-10-CM | POA: Diagnosis not present

## 2014-02-18 DIAGNOSIS — E46 Unspecified protein-calorie malnutrition: Secondary | ICD-10-CM | POA: Diagnosis not present

## 2014-02-25 ENCOUNTER — Encounter: Payer: Medicare Other | Admitting: Internal Medicine

## 2014-03-15 DIAGNOSIS — S82209A Unspecified fracture of shaft of unspecified tibia, initial encounter for closed fracture: Secondary | ICD-10-CM | POA: Diagnosis not present

## 2014-03-15 DIAGNOSIS — K5900 Constipation, unspecified: Secondary | ICD-10-CM | POA: Diagnosis not present

## 2014-03-15 DIAGNOSIS — D649 Anemia, unspecified: Secondary | ICD-10-CM | POA: Diagnosis not present

## 2014-03-15 DIAGNOSIS — R609 Edema, unspecified: Secondary | ICD-10-CM | POA: Diagnosis not present

## 2014-03-15 DIAGNOSIS — E46 Unspecified protein-calorie malnutrition: Secondary | ICD-10-CM | POA: Diagnosis not present

## 2014-03-18 DIAGNOSIS — Z452 Encounter for adjustment and management of vascular access device: Secondary | ICD-10-CM | POA: Diagnosis not present

## 2014-03-18 DIAGNOSIS — J45909 Unspecified asthma, uncomplicated: Secondary | ICD-10-CM | POA: Diagnosis not present

## 2014-03-18 DIAGNOSIS — E8779 Other fluid overload: Secondary | ICD-10-CM | POA: Diagnosis not present

## 2014-03-18 DIAGNOSIS — T8132XA Disruption of internal operation (surgical) wound, not elsewhere classified, initial encounter: Secondary | ICD-10-CM | POA: Diagnosis present

## 2014-03-18 DIAGNOSIS — G8918 Other acute postprocedural pain: Secondary | ICD-10-CM | POA: Diagnosis not present

## 2014-03-18 DIAGNOSIS — Z96659 Presence of unspecified artificial knee joint: Secondary | ICD-10-CM | POA: Diagnosis not present

## 2014-03-18 DIAGNOSIS — C50919 Malignant neoplasm of unspecified site of unspecified female breast: Secondary | ICD-10-CM | POA: Diagnosis present

## 2014-03-18 DIAGNOSIS — H04129 Dry eye syndrome of unspecified lacrimal gland: Secondary | ICD-10-CM | POA: Diagnosis not present

## 2014-03-18 DIAGNOSIS — T889XXS Complication of surgical and medical care, unspecified, sequela: Secondary | ICD-10-CM | POA: Diagnosis not present

## 2014-03-18 DIAGNOSIS — I5032 Chronic diastolic (congestive) heart failure: Secondary | ICD-10-CM | POA: Diagnosis not present

## 2014-03-18 DIAGNOSIS — R0609 Other forms of dyspnea: Secondary | ICD-10-CM | POA: Diagnosis not present

## 2014-03-18 DIAGNOSIS — IMO0002 Reserved for concepts with insufficient information to code with codable children: Secondary | ICD-10-CM | POA: Diagnosis not present

## 2014-03-18 DIAGNOSIS — T8131XA Disruption of external operation (surgical) wound, not elsewhere classified, initial encounter: Secondary | ICD-10-CM | POA: Diagnosis not present

## 2014-03-18 DIAGNOSIS — Z950 Presence of cardiac pacemaker: Secondary | ICD-10-CM | POA: Diagnosis not present

## 2014-03-18 DIAGNOSIS — M810 Age-related osteoporosis without current pathological fracture: Secondary | ICD-10-CM | POA: Diagnosis present

## 2014-03-18 DIAGNOSIS — K5900 Constipation, unspecified: Secondary | ICD-10-CM | POA: Diagnosis not present

## 2014-03-18 DIAGNOSIS — I5023 Acute on chronic systolic (congestive) heart failure: Secondary | ICD-10-CM | POA: Diagnosis not present

## 2014-03-18 DIAGNOSIS — J811 Chronic pulmonary edema: Secondary | ICD-10-CM | POA: Diagnosis not present

## 2014-03-18 DIAGNOSIS — Z5189 Encounter for other specified aftercare: Secondary | ICD-10-CM | POA: Diagnosis not present

## 2014-03-18 DIAGNOSIS — I421 Obstructive hypertrophic cardiomyopathy: Secondary | ICD-10-CM | POA: Diagnosis not present

## 2014-03-18 DIAGNOSIS — J90 Pleural effusion, not elsewhere classified: Secondary | ICD-10-CM | POA: Diagnosis not present

## 2014-03-18 DIAGNOSIS — R0602 Shortness of breath: Secondary | ICD-10-CM | POA: Diagnosis not present

## 2014-03-18 DIAGNOSIS — A4902 Methicillin resistant Staphylococcus aureus infection, unspecified site: Secondary | ICD-10-CM | POA: Diagnosis not present

## 2014-03-18 DIAGNOSIS — T8140XA Infection following a procedure, unspecified, initial encounter: Secondary | ICD-10-CM | POA: Diagnosis not present

## 2014-03-18 DIAGNOSIS — Z45018 Encounter for adjustment and management of other part of cardiac pacemaker: Secondary | ICD-10-CM | POA: Diagnosis not present

## 2014-03-18 DIAGNOSIS — T8579XA Infection and inflammatory reaction due to other internal prosthetic devices, implants and grafts, initial encounter: Secondary | ICD-10-CM | POA: Diagnosis not present

## 2014-03-18 DIAGNOSIS — I4581 Long QT syndrome: Secondary | ICD-10-CM | POA: Diagnosis not present

## 2014-03-18 DIAGNOSIS — T8450XA Infection and inflammatory reaction due to unspecified internal joint prosthesis, initial encounter: Secondary | ICD-10-CM | POA: Diagnosis present

## 2014-03-18 DIAGNOSIS — I4891 Unspecified atrial fibrillation: Secondary | ICD-10-CM | POA: Diagnosis not present

## 2014-03-18 DIAGNOSIS — Z7901 Long term (current) use of anticoagulants: Secondary | ICD-10-CM | POA: Diagnosis not present

## 2014-03-18 DIAGNOSIS — I2789 Other specified pulmonary heart diseases: Secondary | ICD-10-CM | POA: Diagnosis present

## 2014-03-18 DIAGNOSIS — I509 Heart failure, unspecified: Secondary | ICD-10-CM | POA: Diagnosis not present

## 2014-03-18 DIAGNOSIS — R0989 Other specified symptoms and signs involving the circulatory and respiratory systems: Secondary | ICD-10-CM | POA: Diagnosis not present

## 2014-03-18 DIAGNOSIS — M62838 Other muscle spasm: Secondary | ICD-10-CM | POA: Diagnosis not present

## 2014-03-18 DIAGNOSIS — M069 Rheumatoid arthritis, unspecified: Secondary | ICD-10-CM | POA: Diagnosis present

## 2014-03-25 DIAGNOSIS — T8450XA Infection and inflammatory reaction due to unspecified internal joint prosthesis, initial encounter: Secondary | ICD-10-CM | POA: Diagnosis not present

## 2014-03-25 DIAGNOSIS — R0602 Shortness of breath: Secondary | ICD-10-CM | POA: Diagnosis not present

## 2014-03-25 DIAGNOSIS — J45909 Unspecified asthma, uncomplicated: Secondary | ICD-10-CM | POA: Diagnosis not present

## 2014-03-25 DIAGNOSIS — L97809 Non-pressure chronic ulcer of other part of unspecified lower leg with unspecified severity: Secondary | ICD-10-CM | POA: Diagnosis not present

## 2014-03-25 DIAGNOSIS — Z792 Long term (current) use of antibiotics: Secondary | ICD-10-CM | POA: Diagnosis not present

## 2014-03-25 DIAGNOSIS — I10 Essential (primary) hypertension: Secondary | ICD-10-CM | POA: Diagnosis not present

## 2014-03-25 DIAGNOSIS — G8918 Other acute postprocedural pain: Secondary | ICD-10-CM | POA: Diagnosis not present

## 2014-03-25 DIAGNOSIS — J90 Pleural effusion, not elsewhere classified: Secondary | ICD-10-CM | POA: Diagnosis not present

## 2014-03-25 DIAGNOSIS — M069 Rheumatoid arthritis, unspecified: Secondary | ICD-10-CM | POA: Diagnosis not present

## 2014-03-25 DIAGNOSIS — T889XXS Complication of surgical and medical care, unspecified, sequela: Secondary | ICD-10-CM | POA: Diagnosis not present

## 2014-03-25 DIAGNOSIS — I442 Atrioventricular block, complete: Secondary | ICD-10-CM | POA: Diagnosis not present

## 2014-03-25 DIAGNOSIS — I5023 Acute on chronic systolic (congestive) heart failure: Secondary | ICD-10-CM | POA: Diagnosis not present

## 2014-03-25 DIAGNOSIS — Z452 Encounter for adjustment and management of vascular access device: Secondary | ICD-10-CM | POA: Diagnosis not present

## 2014-03-25 DIAGNOSIS — T85898A Other specified complication of other internal prosthetic devices, implants and grafts, initial encounter: Secondary | ICD-10-CM | POA: Diagnosis not present

## 2014-03-25 DIAGNOSIS — I4891 Unspecified atrial fibrillation: Secondary | ICD-10-CM | POA: Diagnosis not present

## 2014-03-25 DIAGNOSIS — I059 Rheumatic mitral valve disease, unspecified: Secondary | ICD-10-CM | POA: Diagnosis not present

## 2014-03-25 DIAGNOSIS — Z5181 Encounter for therapeutic drug level monitoring: Secondary | ICD-10-CM | POA: Diagnosis not present

## 2014-03-25 DIAGNOSIS — I5030 Unspecified diastolic (congestive) heart failure: Secondary | ICD-10-CM | POA: Diagnosis not present

## 2014-03-25 DIAGNOSIS — D649 Anemia, unspecified: Secondary | ICD-10-CM | POA: Diagnosis not present

## 2014-03-25 DIAGNOSIS — M810 Age-related osteoporosis without current pathological fracture: Secondary | ICD-10-CM | POA: Diagnosis not present

## 2014-03-25 DIAGNOSIS — H8110 Benign paroxysmal vertigo, unspecified ear: Secondary | ICD-10-CM | POA: Diagnosis not present

## 2014-03-25 DIAGNOSIS — H04129 Dry eye syndrome of unspecified lacrimal gland: Secondary | ICD-10-CM | POA: Diagnosis not present

## 2014-03-25 DIAGNOSIS — K5900 Constipation, unspecified: Secondary | ICD-10-CM | POA: Diagnosis not present

## 2014-03-25 DIAGNOSIS — T847XXA Infection and inflammatory reaction due to other internal orthopedic prosthetic devices, implants and grafts, initial encounter: Secondary | ICD-10-CM | POA: Diagnosis not present

## 2014-03-25 DIAGNOSIS — M62838 Other muscle spasm: Secondary | ICD-10-CM | POA: Diagnosis not present

## 2014-03-25 DIAGNOSIS — M5030 Other cervical disc degeneration, unspecified cervical region: Secondary | ICD-10-CM | POA: Diagnosis not present

## 2014-03-25 DIAGNOSIS — Z853 Personal history of malignant neoplasm of breast: Secondary | ICD-10-CM | POA: Diagnosis not present

## 2014-03-25 DIAGNOSIS — Z45018 Encounter for adjustment and management of other part of cardiac pacemaker: Secondary | ICD-10-CM | POA: Diagnosis not present

## 2014-03-25 DIAGNOSIS — Z950 Presence of cardiac pacemaker: Secondary | ICD-10-CM | POA: Diagnosis not present

## 2014-03-25 DIAGNOSIS — I421 Obstructive hypertrophic cardiomyopathy: Secondary | ICD-10-CM | POA: Diagnosis not present

## 2014-03-25 DIAGNOSIS — J811 Chronic pulmonary edema: Secondary | ICD-10-CM | POA: Diagnosis not present

## 2014-03-25 DIAGNOSIS — I509 Heart failure, unspecified: Secondary | ICD-10-CM | POA: Diagnosis not present

## 2014-03-25 DIAGNOSIS — Z79899 Other long term (current) drug therapy: Secondary | ICD-10-CM | POA: Diagnosis not present

## 2014-03-25 DIAGNOSIS — Z5189 Encounter for other specified aftercare: Secondary | ICD-10-CM | POA: Diagnosis not present

## 2014-03-31 ENCOUNTER — Encounter: Payer: Self-pay | Admitting: Physician Assistant

## 2014-04-02 ENCOUNTER — Ambulatory Visit (INDEPENDENT_AMBULATORY_CARE_PROVIDER_SITE_OTHER): Payer: Medicare Other | Admitting: Physician Assistant

## 2014-04-02 ENCOUNTER — Encounter: Payer: Self-pay | Admitting: Physician Assistant

## 2014-04-02 VITALS — BP 120/70 | HR 70 | Ht 60.0 in | Wt 132.0 lb

## 2014-04-02 DIAGNOSIS — I421 Obstructive hypertrophic cardiomyopathy: Secondary | ICD-10-CM | POA: Diagnosis not present

## 2014-04-02 DIAGNOSIS — I10 Essential (primary) hypertension: Secondary | ICD-10-CM

## 2014-04-02 DIAGNOSIS — I5030 Unspecified diastolic (congestive) heart failure: Secondary | ICD-10-CM

## 2014-04-02 DIAGNOSIS — I272 Pulmonary hypertension, unspecified: Secondary | ICD-10-CM

## 2014-04-02 DIAGNOSIS — I2789 Other specified pulmonary heart diseases: Secondary | ICD-10-CM

## 2014-04-02 DIAGNOSIS — I4891 Unspecified atrial fibrillation: Secondary | ICD-10-CM

## 2014-04-02 DIAGNOSIS — J90 Pleural effusion, not elsewhere classified: Secondary | ICD-10-CM

## 2014-04-02 DIAGNOSIS — Z950 Presence of cardiac pacemaker: Secondary | ICD-10-CM

## 2014-04-02 NOTE — Patient Instructions (Signed)
**Note De-Identified  Obfuscation** A chest x-ray takes a picture of the organs and structures inside the chest, including the heart, lungs, and blood vessels. This test can show several things, including, whether the heart is enlarges; whether fluid is building up in the lungs; and whether pacemaker / defibrillator leads are still in place. This is to be done at [facility] recommends that you continue on your current medications as directed. Please refer to the Current Medication list given to you today.  Your physician recommends that you schedule a follow-up appointment in: 1 month with Dr Lovena Le

## 2014-04-02 NOTE — Progress Notes (Addendum)
[name], [name], [name]  [address] Phone: [phone] Fax:  [phone]  Date:  04/02/2014   ID:  [name], DOB Jul 29, 1931, MRN [id number]  PCP:  Sharon Gravel, MD  Cardiologist:  Dr. Cristopher Jackson     History of Present Illness: [name] is a 80 y.o. female with a hx of HOCM s/p septal myomectomy (in 2006 by Dr. Evel[facility] at [name]), complete heart block s/p pacemaker, atrial fibrillation, breast CA, HTN.  She was recently seen by Dr. Lovena Jackson 01/16/14 prior to planned surgical procedure. She then underwent distal femur and proximal tibia replacement in 01/2014 at [name]. This was complicated by infection. She underwent debridement and was admitted 3/31-4/7. Her admission was complicated by volume excess. She was seen by cardiology. Chest x-ray demonstrated right pleural effusion. She was placed on Lasix and asked to follow up with cardiology to determine need for ongoing diuretic therapy. Of note, her echocardiogram demonstrated low normal LV function with severe LVH and apical hypertrophy, moderate TR and elevated pulmonary pressures.  In review of her records from [name], she had an echo in 01/2014 that demonstrated normal LVF, no significant pulmonary HTN and stable apical hypertrophy when compared to last echo in 2006.  She is now staying at [name]. She seems to be getting better. She denies significant dyspnea. She is working with physical therapy. She denies chest pain or syncope. She denies PND or significant Jackson edema on the left. She does have a cough that is productive of white sputum.  Studies:  - LHC (01/2005):  No significant CAD, EF 60%, mild MR  - Echo (01/2014 at [facility]):  EF > 55%, mild MR, mild TR, RVSP 29 mmHg, apical hypertrophy, no change from 2006  - Echo (03/24/14 - at [name]):  EF 50%, severe LVH, apical hypertrophy with small LV cavity, mod LAE, normal RV function, mild MR, mild PR, mod TR, RVSP peak 69 mmHg.    CXR (03/24/14 - at [name]): Impression:    Moderate sized, layering right pleural effusion.    Recent Labs: 10/24/2013: ALT 17  11/22/2013: Creatinine 1.2; Hemoglobin 12.4; Potassium 4.1 03/31/14: Hemoglobin 10.4, potassium 4.7, creatinine 0.93, BNP 1447  Wt Readings from Last 3 Encounters:  11/27/13 144 lb (65.318 kg)  11/27/13 144 lb (65.318 kg)  11/18/13 144 lb (65.318 kg)     Past Medical History  Diagnosis Date  . Hypertension   . Mitral regurgitation   . Hypertrophic obstructive cardiomyopathy     Required septal myotomy  . Urinary incontinence   . Cystitis   . Hemorrhoids   . Bronchitis   . Asthma   . Anemia   . Heart block     Complete heart block post surgical requiring pacer  . Hypokalemia     Postoperative, improved  . Hyponatremia     Mild postoperative, improved  . Blood transfusion   . Constipation   . Arthritis pain   . Cancer     lt breast    Current Outpatient Prescriptions  Medication Sig Dispense Refill  . albuterol (PROAIR HFA) 108 (90 BASE) MCG/ACT inhaler Inhale 2 puffs into the lungs every 6 (six) hours as needed for wheezing.       Marland Kitchen alendronate (FOSAMAX) 70 MG tablet Take 70 mg by mouth every 7 (seven) days. Take with a full glass of water on an empty stomach. Takes on Tuesdays      . amLODipine (NORVASC) 2.5 MG tablet Take  2.5 mg by mouth daily.      Marland Kitchen apixaban (ELIQUIS) 2.5 MG TABS tablet Take 2.5 mg by mouth 2 (two) times daily.      . calcium citrate-vitamin D (CITRACAL+D) 315-200 MG-UNIT per tablet Take 1 tablet by mouth 2 (two) times daily.      . cyclobenzaprine (FLEXERIL) 5 MG tablet Take 5 mg by mouth daily.      . cycloSPORINE (RESTASIS) 0.05 % ophthalmic emulsion Place 1 drop into both eyes 2 (two) times daily.       . meloxicam (MOBIC) 15 MG tablet Take 15 mg by mouth daily.      . metoprolol (TOPROL-XL) 100 MG 24 hr tablet Take 100 mg by mouth daily.       . Multiple Vitamin (MULTIVITAMIN) tablet Take 1 tablet by mouth daily.       . traMADol (ULTRAM-ER) 300 MG 24 hr  tablet Take 600 mg by mouth every 4 (four) hours as needed (for knee pain).        No current facility-administered medications for this visit.    Allergies:   Codeine and Morphine   Social History:  The patient  reports that she has never smoked. She does not have any smokeless tobacco history on file. She reports that she does not drink alcohol or use illicit drugs.   Family History:  The patient's family history includes Cancer in her father and mother; Diabetes in her brother; Heart disease in her brother and mother; Hypertension in her mother; Pancreatic cancer in her father, mother, and other.   ROS:  Please see the history of present illness.   She denies fever.   All other systems reviewed and negative.   PHYSICAL EXAM: VS:  BP 120/70  Pulse 70  Ht 5' (1.524 m)  Wt 132 lb (59.875 kg)  BMI 25.78 kg/m2 Well nourished, well developed, in no acute distress HEENT: normal Neck: no JVD Cardiac:  normal S1, S2; RRR; brief 1/6 murmuralong the LSB Lungs:  Decreased breath sounds bilaterally, no wheezing, rhonchi or rales Abd: soft, nontender, no hepatomegaly Ext:  Right knee immobilizer in place, no Jackson edema on the left Skin: warm and dry Neuro:  CNs 2-12 intact, no focal abnormalities noted  EKG:  V paced, HR 70     ASSESSMENT AND PLAN:  1. Diastolic CHF:  She likely developed volume overload in the setting of apical hypertrophic cardiomyopathy and fluid resuscitation for her leg infection.  EF was low normal on echo at 50%.  I received recent labs obtained at the nursing home. Her creatinine and potassium are stable. BNP remains elevated at 1447. I will obtain a follow up chest x-ray. I suspect she will need to remain on Lasix for now.  If effusion completely resolved, consider stopping Lasix after one week. 2. HOCM: This appeared to be stable by recent echocardiogram done at [name]. 3. Pulmonary Hypertension: I suspect that this was elevated in relation to recent volume excess and  pleural effusion. Consider repeating echocardiogram in the next one to 2 months. 4. Pleural Effusion: As noted, chest x-ray will be repeated. 5. Atrial Fibrillation: Continue Eliquis for stroke prophylaxis. 6. Status Post Pacemaker: Follow up with EP as planned. 7. Hypertension: Controlled. 8. Disposition: Follow up with Dr. Lovena [facility] in one month.  Signed, Richardson Dopp, PA-C  04/02/2014 2:52 PM

## 2014-04-11 DIAGNOSIS — I509 Heart failure, unspecified: Secondary | ICD-10-CM | POA: Diagnosis not present

## 2014-04-11 DIAGNOSIS — H8110 Benign paroxysmal vertigo, unspecified ear: Secondary | ICD-10-CM | POA: Diagnosis not present

## 2014-04-15 DIAGNOSIS — Z792 Long term (current) use of antibiotics: Secondary | ICD-10-CM | POA: Diagnosis not present

## 2014-04-15 DIAGNOSIS — Z5189 Encounter for other specified aftercare: Secondary | ICD-10-CM | POA: Diagnosis not present

## 2014-04-22 ENCOUNTER — Encounter: Payer: Self-pay | Admitting: Internal Medicine

## 2014-04-22 ENCOUNTER — Telehealth: Payer: Self-pay | Admitting: Internal Medicine

## 2014-04-22 ENCOUNTER — Ambulatory Visit (INDEPENDENT_AMBULATORY_CARE_PROVIDER_SITE_OTHER): Payer: Medicare Other | Admitting: *Deleted

## 2014-04-22 DIAGNOSIS — I442 Atrioventricular block, complete: Secondary | ICD-10-CM

## 2014-04-22 NOTE — Telephone Encounter (Signed)
Gave instructions to [place], Ms. Caroll's nurse, on how to send a remote.

## 2014-04-22 NOTE — Telephone Encounter (Signed)
New Prob  States pt is not sure how to transmit and is requesting assistance. States no number is located on the box. Please call.

## 2014-04-24 DIAGNOSIS — T889XXS Complication of surgical and medical care, unspecified, sequela: Secondary | ICD-10-CM | POA: Diagnosis not present

## 2014-04-27 LAB — MDC_IDC_ENUM_SESS_TYPE_REMOTE
Battery Impedance: 100 Ohm
Battery Remaining Longevity: 125 mo
Battery Voltage: 2.79 V
Brady Statistic AP VP Percent: 36 %
Brady Statistic AP VS Percent: 0 %
Brady Statistic AS VP Percent: 40 %
Brady Statistic AS VS Percent: 23 %
Date Time Interrogation Session: 20150505140700
Lead Channel Impedance Value: 453 Ohm
Lead Channel Impedance Value: 522 Ohm
Lead Channel Pacing Threshold Amplitude: 1 V
Lead Channel Pacing Threshold Pulse Width: 0.4 ms
Lead Channel Sensing Intrinsic Amplitude: 1 mV
Lead Channel Setting Pacing Amplitude: 2.5 V
Lead Channel Setting Pacing Amplitude: 2.5 V
Lead Channel Setting Pacing Pulse Width: 0.4 ms
Lead Channel Setting Sensing Sensitivity: 4 mV

## 2014-05-01 ENCOUNTER — Encounter: Payer: Self-pay | Admitting: Cardiology

## 2014-05-02 NOTE — Progress Notes (Signed)
Remote pacemaker transmission.   

## 2014-05-04 DIAGNOSIS — T847XXA Infection and inflammatory reaction due to other internal orthopedic prosthetic devices, implants and grafts, initial encounter: Secondary | ICD-10-CM | POA: Diagnosis not present

## 2014-05-04 DIAGNOSIS — Z452 Encounter for adjustment and management of vascular access device: Secondary | ICD-10-CM | POA: Diagnosis not present

## 2014-05-04 DIAGNOSIS — Z950 Presence of cardiac pacemaker: Secondary | ICD-10-CM | POA: Diagnosis not present

## 2014-05-04 DIAGNOSIS — I442 Atrioventricular block, complete: Secondary | ICD-10-CM | POA: Diagnosis not present

## 2014-05-04 DIAGNOSIS — I421 Obstructive hypertrophic cardiomyopathy: Secondary | ICD-10-CM | POA: Diagnosis not present

## 2014-05-04 DIAGNOSIS — I059 Rheumatic mitral valve disease, unspecified: Secondary | ICD-10-CM | POA: Diagnosis not present

## 2014-05-04 DIAGNOSIS — M810 Age-related osteoporosis without current pathological fracture: Secondary | ICD-10-CM | POA: Diagnosis not present

## 2014-05-04 DIAGNOSIS — Z45018 Encounter for adjustment and management of other part of cardiac pacemaker: Secondary | ICD-10-CM | POA: Diagnosis not present

## 2014-05-05 ENCOUNTER — Encounter: Payer: Medicare Other | Admitting: Internal Medicine

## 2014-05-05 DIAGNOSIS — T847XXA Infection and inflammatory reaction due to other internal orthopedic prosthetic devices, implants and grafts, initial encounter: Secondary | ICD-10-CM | POA: Diagnosis not present

## 2014-05-05 DIAGNOSIS — I421 Obstructive hypertrophic cardiomyopathy: Secondary | ICD-10-CM | POA: Diagnosis not present

## 2014-05-05 DIAGNOSIS — Y831 Surgical operation with implant of artificial internal device as the cause of abnormal reaction of the patient, or of later complication, without mention of misadventure at the time of the procedure: Secondary | ICD-10-CM | POA: Diagnosis not present

## 2014-05-05 DIAGNOSIS — T8140XA Infection following a procedure, unspecified, initial encounter: Secondary | ICD-10-CM | POA: Diagnosis not present

## 2014-05-05 DIAGNOSIS — Z4789 Encounter for other orthopedic aftercare: Secondary | ICD-10-CM | POA: Diagnosis not present

## 2014-05-05 DIAGNOSIS — M810 Age-related osteoporosis without current pathological fracture: Secondary | ICD-10-CM | POA: Diagnosis not present

## 2014-05-05 DIAGNOSIS — Z950 Presence of cardiac pacemaker: Secondary | ICD-10-CM | POA: Diagnosis not present

## 2014-05-05 DIAGNOSIS — Z5189 Encounter for other specified aftercare: Secondary | ICD-10-CM | POA: Diagnosis not present

## 2014-05-05 DIAGNOSIS — I442 Atrioventricular block, complete: Secondary | ICD-10-CM | POA: Diagnosis not present

## 2014-05-05 DIAGNOSIS — I059 Rheumatic mitral valve disease, unspecified: Secondary | ICD-10-CM | POA: Diagnosis not present

## 2014-05-05 DIAGNOSIS — S81809A Unspecified open wound, unspecified lower leg, initial encounter: Secondary | ICD-10-CM | POA: Diagnosis not present

## 2014-05-06 DIAGNOSIS — L290 Pruritus ani: Secondary | ICD-10-CM | POA: Diagnosis not present

## 2014-05-06 DIAGNOSIS — S81809A Unspecified open wound, unspecified lower leg, initial encounter: Secondary | ICD-10-CM | POA: Diagnosis not present

## 2014-05-06 DIAGNOSIS — J309 Allergic rhinitis, unspecified: Secondary | ICD-10-CM | POA: Diagnosis not present

## 2014-05-06 DIAGNOSIS — T82598A Other mechanical complication of other cardiac and vascular devices and implants, initial encounter: Secondary | ICD-10-CM | POA: Diagnosis not present

## 2014-05-06 DIAGNOSIS — S81009A Unspecified open wound, unspecified knee, initial encounter: Secondary | ICD-10-CM | POA: Diagnosis not present

## 2014-05-06 DIAGNOSIS — I739 Peripheral vascular disease, unspecified: Secondary | ICD-10-CM | POA: Diagnosis not present

## 2014-05-06 DIAGNOSIS — I442 Atrioventricular block, complete: Secondary | ICD-10-CM | POA: Diagnosis not present

## 2014-05-06 DIAGNOSIS — H04129 Dry eye syndrome of unspecified lacrimal gland: Secondary | ICD-10-CM | POA: Diagnosis not present

## 2014-05-06 DIAGNOSIS — I059 Rheumatic mitral valve disease, unspecified: Secondary | ICD-10-CM | POA: Diagnosis not present

## 2014-05-06 DIAGNOSIS — E46 Unspecified protein-calorie malnutrition: Secondary | ICD-10-CM | POA: Diagnosis not present

## 2014-05-06 DIAGNOSIS — I4891 Unspecified atrial fibrillation: Secondary | ICD-10-CM | POA: Diagnosis not present

## 2014-05-06 DIAGNOSIS — S51809A Unspecified open wound of unspecified forearm, initial encounter: Secondary | ICD-10-CM | POA: Diagnosis not present

## 2014-05-06 DIAGNOSIS — Z452 Encounter for adjustment and management of vascular access device: Secondary | ICD-10-CM | POA: Diagnosis not present

## 2014-05-06 DIAGNOSIS — I517 Cardiomegaly: Secondary | ICD-10-CM | POA: Diagnosis not present

## 2014-05-06 DIAGNOSIS — Z792 Long term (current) use of antibiotics: Secondary | ICD-10-CM | POA: Diagnosis not present

## 2014-05-06 DIAGNOSIS — Z950 Presence of cardiac pacemaker: Secondary | ICD-10-CM | POA: Diagnosis not present

## 2014-05-06 DIAGNOSIS — T847XXA Infection and inflammatory reaction due to other internal orthopedic prosthetic devices, implants and grafts, initial encounter: Secondary | ICD-10-CM | POA: Diagnosis not present

## 2014-05-06 DIAGNOSIS — T82898A Other specified complication of vascular prosthetic devices, implants and grafts, initial encounter: Secondary | ICD-10-CM | POA: Diagnosis not present

## 2014-05-06 DIAGNOSIS — Z5189 Encounter for other specified aftercare: Secondary | ICD-10-CM | POA: Diagnosis not present

## 2014-05-06 DIAGNOSIS — R0989 Other specified symptoms and signs involving the circulatory and respiratory systems: Secondary | ICD-10-CM | POA: Diagnosis not present

## 2014-05-06 DIAGNOSIS — M810 Age-related osteoporosis without current pathological fracture: Secondary | ICD-10-CM | POA: Diagnosis not present

## 2014-05-06 DIAGNOSIS — L02419 Cutaneous abscess of limb, unspecified: Secondary | ICD-10-CM | POA: Diagnosis not present

## 2014-05-06 DIAGNOSIS — I421 Obstructive hypertrophic cardiomyopathy: Secondary | ICD-10-CM | POA: Diagnosis not present

## 2014-05-06 DIAGNOSIS — I509 Heart failure, unspecified: Secondary | ICD-10-CM | POA: Diagnosis not present

## 2014-05-06 DIAGNOSIS — I7389 Other specified peripheral vascular diseases: Secondary | ICD-10-CM | POA: Diagnosis not present

## 2014-05-06 DIAGNOSIS — L089 Local infection of the skin and subcutaneous tissue, unspecified: Secondary | ICD-10-CM | POA: Diagnosis not present

## 2014-05-06 DIAGNOSIS — J45909 Unspecified asthma, uncomplicated: Secondary | ICD-10-CM | POA: Diagnosis not present

## 2014-05-06 DIAGNOSIS — Z7901 Long term (current) use of anticoagulants: Secondary | ICD-10-CM | POA: Diagnosis not present

## 2014-05-06 DIAGNOSIS — T148XXA Other injury of unspecified body region, initial encounter: Secondary | ICD-10-CM | POA: Diagnosis not present

## 2014-05-06 DIAGNOSIS — I10 Essential (primary) hypertension: Secondary | ICD-10-CM | POA: Diagnosis not present

## 2014-05-06 DIAGNOSIS — K5900 Constipation, unspecified: Secondary | ICD-10-CM | POA: Diagnosis not present

## 2014-05-06 DIAGNOSIS — I422 Other hypertrophic cardiomyopathy: Secondary | ICD-10-CM | POA: Diagnosis not present

## 2014-05-06 DIAGNOSIS — T8140XA Infection following a procedure, unspecified, initial encounter: Secondary | ICD-10-CM | POA: Diagnosis not present

## 2014-05-06 DIAGNOSIS — G8918 Other acute postprocedural pain: Secondary | ICD-10-CM | POA: Diagnosis not present

## 2014-05-06 DIAGNOSIS — L03119 Cellulitis of unspecified part of limb: Secondary | ICD-10-CM | POA: Diagnosis not present

## 2014-05-06 DIAGNOSIS — R6889 Other general symptoms and signs: Secondary | ICD-10-CM | POA: Diagnosis not present

## 2014-05-06 DIAGNOSIS — Z09 Encounter for follow-up examination after completed treatment for conditions other than malignant neoplasm: Secondary | ICD-10-CM | POA: Diagnosis not present

## 2014-05-06 DIAGNOSIS — H9190 Unspecified hearing loss, unspecified ear: Secondary | ICD-10-CM | POA: Diagnosis not present

## 2014-05-06 DIAGNOSIS — Z45018 Encounter for adjustment and management of other part of cardiac pacemaker: Secondary | ICD-10-CM | POA: Diagnosis not present

## 2014-05-11 DIAGNOSIS — I10 Essential (primary) hypertension: Secondary | ICD-10-CM | POA: Diagnosis not present

## 2014-05-11 DIAGNOSIS — L089 Local infection of the skin and subcutaneous tissue, unspecified: Secondary | ICD-10-CM | POA: Diagnosis not present

## 2014-05-11 DIAGNOSIS — I7389 Other specified peripheral vascular diseases: Secondary | ICD-10-CM | POA: Diagnosis not present

## 2014-05-11 DIAGNOSIS — I422 Other hypertrophic cardiomyopathy: Secondary | ICD-10-CM | POA: Diagnosis not present

## 2014-05-11 DIAGNOSIS — I4891 Unspecified atrial fibrillation: Secondary | ICD-10-CM | POA: Diagnosis not present

## 2014-05-14 ENCOUNTER — Other Ambulatory Visit (HOSPITAL_COMMUNITY): Payer: Self-pay | Admitting: Internal Medicine

## 2014-05-14 DIAGNOSIS — B999 Unspecified infectious disease: Secondary | ICD-10-CM

## 2014-05-15 ENCOUNTER — Ambulatory Visit (HOSPITAL_COMMUNITY)
Admission: RE | Admit: 2014-05-15 | Discharge: 2014-05-15 | Disposition: A | Discharge status: Home / Self Care | Payer: Medicare Other | Source: Ambulatory Visit | Attending: Internal Medicine | Admitting: Internal Medicine

## 2014-05-15 DIAGNOSIS — T82898A Other specified complication of vascular prosthetic devices, implants and grafts, initial encounter: Secondary | ICD-10-CM | POA: Insufficient documentation

## 2014-05-15 DIAGNOSIS — T82598A Other mechanical complication of other cardiac and vascular devices and implants, initial encounter: Secondary | ICD-10-CM | POA: Diagnosis not present

## 2014-05-15 DIAGNOSIS — Y849 Medical procedure, unspecified as the cause of abnormal reaction of the patient, or of later complication, without mention of misadventure at the time of the procedure: Secondary | ICD-10-CM | POA: Insufficient documentation

## 2014-05-15 DIAGNOSIS — B999 Unspecified infectious disease: Secondary | ICD-10-CM

## 2014-05-15 NOTE — Procedures (Signed)
LUE PICC exchange 39 cm SVC RA

## 2014-05-18 DIAGNOSIS — I10 Essential (primary) hypertension: Secondary | ICD-10-CM | POA: Diagnosis not present

## 2014-05-18 DIAGNOSIS — K5900 Constipation, unspecified: Secondary | ICD-10-CM | POA: Diagnosis not present

## 2014-05-18 DIAGNOSIS — L02419 Cutaneous abscess of limb, unspecified: Secondary | ICD-10-CM | POA: Diagnosis not present

## 2014-05-18 DIAGNOSIS — L290 Pruritus ani: Secondary | ICD-10-CM | POA: Diagnosis not present

## 2014-05-20 DIAGNOSIS — Z792 Long term (current) use of antibiotics: Secondary | ICD-10-CM | POA: Diagnosis not present

## 2014-05-20 DIAGNOSIS — Z5189 Encounter for other specified aftercare: Secondary | ICD-10-CM | POA: Diagnosis not present

## 2014-05-23 DIAGNOSIS — I509 Heart failure, unspecified: Secondary | ICD-10-CM | POA: Diagnosis not present

## 2014-05-23 DIAGNOSIS — I10 Essential (primary) hypertension: Secondary | ICD-10-CM | POA: Diagnosis not present

## 2014-05-23 DIAGNOSIS — I739 Peripheral vascular disease, unspecified: Secondary | ICD-10-CM | POA: Diagnosis not present

## 2014-05-23 DIAGNOSIS — L02419 Cutaneous abscess of limb, unspecified: Secondary | ICD-10-CM | POA: Diagnosis not present

## 2014-05-23 DIAGNOSIS — J45909 Unspecified asthma, uncomplicated: Secondary | ICD-10-CM | POA: Diagnosis not present

## 2014-05-23 DIAGNOSIS — L03119 Cellulitis of unspecified part of limb: Secondary | ICD-10-CM | POA: Diagnosis not present

## 2014-05-23 DIAGNOSIS — T8140XA Infection following a procedure, unspecified, initial encounter: Secondary | ICD-10-CM | POA: Diagnosis not present

## 2014-05-26 DIAGNOSIS — T8140XA Infection following a procedure, unspecified, initial encounter: Secondary | ICD-10-CM | POA: Diagnosis not present

## 2014-05-26 DIAGNOSIS — I10 Essential (primary) hypertension: Secondary | ICD-10-CM | POA: Diagnosis not present

## 2014-05-26 DIAGNOSIS — I739 Peripheral vascular disease, unspecified: Secondary | ICD-10-CM | POA: Diagnosis not present

## 2014-05-26 DIAGNOSIS — J45909 Unspecified asthma, uncomplicated: Secondary | ICD-10-CM | POA: Diagnosis not present

## 2014-05-26 DIAGNOSIS — L02419 Cutaneous abscess of limb, unspecified: Secondary | ICD-10-CM | POA: Diagnosis not present

## 2014-05-26 DIAGNOSIS — I509 Heart failure, unspecified: Secondary | ICD-10-CM | POA: Diagnosis not present

## 2014-05-27 DIAGNOSIS — J45909 Unspecified asthma, uncomplicated: Secondary | ICD-10-CM | POA: Diagnosis not present

## 2014-05-27 DIAGNOSIS — T8140XA Infection following a procedure, unspecified, initial encounter: Secondary | ICD-10-CM | POA: Diagnosis not present

## 2014-05-27 DIAGNOSIS — L02419 Cutaneous abscess of limb, unspecified: Secondary | ICD-10-CM | POA: Diagnosis not present

## 2014-05-27 DIAGNOSIS — I10 Essential (primary) hypertension: Secondary | ICD-10-CM | POA: Diagnosis not present

## 2014-05-27 DIAGNOSIS — L03119 Cellulitis of unspecified part of limb: Secondary | ICD-10-CM | POA: Diagnosis not present

## 2014-05-27 DIAGNOSIS — I739 Peripheral vascular disease, unspecified: Secondary | ICD-10-CM | POA: Diagnosis not present

## 2014-05-27 DIAGNOSIS — I509 Heart failure, unspecified: Secondary | ICD-10-CM | POA: Diagnosis not present

## 2014-05-29 ENCOUNTER — Telehealth: Payer: Self-pay | Admitting: Internal Medicine

## 2014-05-29 DIAGNOSIS — I739 Peripheral vascular disease, unspecified: Secondary | ICD-10-CM | POA: Diagnosis not present

## 2014-05-29 DIAGNOSIS — I10 Essential (primary) hypertension: Secondary | ICD-10-CM | POA: Diagnosis not present

## 2014-05-29 DIAGNOSIS — L02419 Cutaneous abscess of limb, unspecified: Secondary | ICD-10-CM | POA: Diagnosis not present

## 2014-05-29 DIAGNOSIS — L03119 Cellulitis of unspecified part of limb: Secondary | ICD-10-CM | POA: Diagnosis not present

## 2014-05-29 DIAGNOSIS — J45909 Unspecified asthma, uncomplicated: Secondary | ICD-10-CM | POA: Diagnosis not present

## 2014-05-29 DIAGNOSIS — T8140XA Infection following a procedure, unspecified, initial encounter: Secondary | ICD-10-CM | POA: Diagnosis not present

## 2014-05-29 DIAGNOSIS — I509 Heart failure, unspecified: Secondary | ICD-10-CM | POA: Diagnosis not present

## 2014-05-29 NOTE — Telephone Encounter (Addendum)
She is suppose to be taking the Furosemide daily, and she has not been.  She will resume and call if her problems continue.  Her rates are controlled and she will call if needs assistance further

## 2014-05-29 NOTE — Telephone Encounter (Signed)
Spoke with patient and she has been in a nursing home for a leg infection.  Just got home yesterday, HHN came today and advised her to call the office as she is having SOB with walking across the room and her left leg is swollen.  This is the opposite leg of infection.  She is going to send a transmission from device so I can see her heart rate and rhythm

## 2014-05-29 NOTE — Telephone Encounter (Signed)
New Message  Pt called states that she is swelling in the ankles// Requests a call back to discuss. Please call

## 2014-05-30 DIAGNOSIS — I739 Peripheral vascular disease, unspecified: Secondary | ICD-10-CM | POA: Diagnosis not present

## 2014-05-30 DIAGNOSIS — I10 Essential (primary) hypertension: Secondary | ICD-10-CM | POA: Diagnosis not present

## 2014-05-30 DIAGNOSIS — J45909 Unspecified asthma, uncomplicated: Secondary | ICD-10-CM | POA: Diagnosis not present

## 2014-05-30 DIAGNOSIS — L02419 Cutaneous abscess of limb, unspecified: Secondary | ICD-10-CM | POA: Diagnosis not present

## 2014-05-30 DIAGNOSIS — T8140XA Infection following a procedure, unspecified, initial encounter: Secondary | ICD-10-CM | POA: Diagnosis not present

## 2014-05-30 DIAGNOSIS — I509 Heart failure, unspecified: Secondary | ICD-10-CM | POA: Diagnosis not present

## 2014-06-02 ENCOUNTER — Telehealth: Payer: Self-pay | Admitting: Nurse Practitioner

## 2014-06-02 NOTE — Telephone Encounter (Signed)
Patient presents as a walk in to Triage RN here today for an appointment, however patient has no appointment scheduled.  Patient c/o left leg edema and SOB.  Patient states she is taking her Lasix as directed (20 mg daily) and is elevating her legs frequently throughout the day.  Patient was advised by Janan Halter, RN on 6/11 to send a remote pacemaker transmission, to take Lasix as prescribed, and to call back with concerns.  Patient here today with mild left leg edema and SOB; states she cannot go far on her walker without "giving out."  Patient reports she was recently discharged from SNF for tx of a broken right leg - states the nurse that comes by to check on her advised her to notify us of her left leg edema.  I was able to schedule patient for an office visit with Dr. Lovena Le on Thursday 6/18.  I advised patient to continue medical therapy and that I am sending message to Dr. Lovena Le and his nurse, Claiborne Billings for advice and to make them aware of f/u scheduled for later this week.  I advised patient to call back with questions or concerns. Patient verbalized understanding and agreement and was d/c in no acute distress.

## 2014-06-03 ENCOUNTER — Emergency Department (HOSPITAL_COMMUNITY): Payer: Medicare Other

## 2014-06-03 ENCOUNTER — Emergency Department (HOSPITAL_COMMUNITY)
Admission: EM | Admit: 2014-06-03 | Discharge: 2014-06-03 | Disposition: A | Discharge status: Home / Self Care | Payer: Medicare Other | Attending: Emergency Medicine | Admitting: Emergency Medicine

## 2014-06-03 ENCOUNTER — Encounter (HOSPITAL_COMMUNITY): Payer: Self-pay | Admitting: Emergency Medicine

## 2014-06-03 DIAGNOSIS — IMO0002 Reserved for concepts with insufficient information to code with codable children: Secondary | ICD-10-CM | POA: Diagnosis not present

## 2014-06-03 DIAGNOSIS — R0609 Other forms of dyspnea: Secondary | ICD-10-CM | POA: Diagnosis not present

## 2014-06-03 DIAGNOSIS — Z79899 Other long term (current) drug therapy: Secondary | ICD-10-CM | POA: Diagnosis not present

## 2014-06-03 DIAGNOSIS — Z8742 Personal history of other diseases of the female genital tract: Secondary | ICD-10-CM | POA: Insufficient documentation

## 2014-06-03 DIAGNOSIS — I10 Essential (primary) hypertension: Secondary | ICD-10-CM | POA: Insufficient documentation

## 2014-06-03 DIAGNOSIS — Z862 Personal history of diseases of the blood and blood-forming organs and certain disorders involving the immune mechanism: Secondary | ICD-10-CM | POA: Diagnosis not present

## 2014-06-03 DIAGNOSIS — J45901 Unspecified asthma with (acute) exacerbation: Secondary | ICD-10-CM | POA: Insufficient documentation

## 2014-06-03 DIAGNOSIS — M129 Arthropathy, unspecified: Secondary | ICD-10-CM | POA: Insufficient documentation

## 2014-06-03 DIAGNOSIS — I739 Peripheral vascular disease, unspecified: Secondary | ICD-10-CM | POA: Diagnosis not present

## 2014-06-03 DIAGNOSIS — R0602 Shortness of breath: Secondary | ICD-10-CM | POA: Diagnosis not present

## 2014-06-03 DIAGNOSIS — I509 Heart failure, unspecified: Secondary | ICD-10-CM | POA: Diagnosis not present

## 2014-06-03 DIAGNOSIS — Z950 Presence of cardiac pacemaker: Secondary | ICD-10-CM | POA: Diagnosis not present

## 2014-06-03 DIAGNOSIS — Z7901 Long term (current) use of anticoagulants: Secondary | ICD-10-CM | POA: Insufficient documentation

## 2014-06-03 DIAGNOSIS — Z853 Personal history of malignant neoplasm of breast: Secondary | ICD-10-CM | POA: Insufficient documentation

## 2014-06-03 DIAGNOSIS — R0600 Dyspnea, unspecified: Secondary | ICD-10-CM

## 2014-06-03 DIAGNOSIS — R0989 Other specified symptoms and signs involving the circulatory and respiratory systems: Secondary | ICD-10-CM | POA: Insufficient documentation

## 2014-06-03 DIAGNOSIS — Z8719 Personal history of other diseases of the digestive system: Secondary | ICD-10-CM | POA: Diagnosis not present

## 2014-06-03 DIAGNOSIS — Z8639 Personal history of other endocrine, nutritional and metabolic disease: Secondary | ICD-10-CM | POA: Insufficient documentation

## 2014-06-03 DIAGNOSIS — D649 Anemia, unspecified: Secondary | ICD-10-CM | POA: Diagnosis not present

## 2014-06-03 DIAGNOSIS — T8140XA Infection following a procedure, unspecified, initial encounter: Secondary | ICD-10-CM | POA: Diagnosis not present

## 2014-06-03 DIAGNOSIS — L02419 Cutaneous abscess of limb, unspecified: Secondary | ICD-10-CM | POA: Diagnosis not present

## 2014-06-03 DIAGNOSIS — J45909 Unspecified asthma, uncomplicated: Secondary | ICD-10-CM | POA: Diagnosis not present

## 2014-06-03 LAB — CBC
HCT: 28.3 % — ABNORMAL LOW (ref 36.0–46.0)
Hemoglobin: 8.6 g/dL — ABNORMAL LOW (ref 12.0–15.0)
MCH: 25.7 pg — ABNORMAL LOW (ref 26.0–34.0)
MCHC: 30.4 g/dL (ref 30.0–36.0)
MCV: 84.5 fL (ref 78.0–100.0)
Platelets: 132 10*3/uL — ABNORMAL LOW (ref 150–400)
RBC: 3.35 MIL/uL — ABNORMAL LOW (ref 3.87–5.11)
RDW: 15.4 % (ref 11.5–15.5)
WBC: 4.2 10*3/uL (ref 4.0–10.5)

## 2014-06-03 LAB — URINALYSIS, ROUTINE W REFLEX MICROSCOPIC
Bilirubin Urine: NEGATIVE
Glucose, UA: NEGATIVE mg/dL
Hgb urine dipstick: NEGATIVE
Ketones, ur: NEGATIVE mg/dL
Leukocytes, UA: NEGATIVE
Nitrite: NEGATIVE
Protein, ur: NEGATIVE mg/dL
Specific Gravity, Urine: 1.012 (ref 1.005–1.030)
Urobilinogen, UA: 0.2 mg/dL (ref 0.0–1.0)
pH: 5.5 (ref 5.0–8.0)

## 2014-06-03 LAB — BASIC METABOLIC PANEL
BUN: 25 mg/dL — ABNORMAL HIGH (ref 6–23)
CO2: 22 mEq/L (ref 19–32)
Calcium: 9.5 mg/dL (ref 8.4–10.5)
Chloride: 97 mEq/L (ref 96–112)
Creatinine, Ser: 1.67 mg/dL — ABNORMAL HIGH (ref 0.50–1.10)
GFR calc Af Amer: 32 mL/min — ABNORMAL LOW (ref >90)
GFR calc non Af Amer: 28 mL/min — ABNORMAL LOW (ref >90)
Glucose, Bld: 111 mg/dL — ABNORMAL HIGH (ref 70–99)
Potassium: 5.3 mEq/L (ref 3.7–5.3)
Sodium: 134 mEq/L — ABNORMAL LOW (ref 137–147)

## 2014-06-03 LAB — SEDIMENTATION RATE: Sed Rate: 6 mm/hr (ref 0–22)

## 2014-06-03 LAB — POC OCCULT BLOOD, ED: Fecal Occult Bld: NEGATIVE

## 2014-06-03 LAB — PRO B NATRIURETIC PEPTIDE: Pro B Natriuretic peptide (BNP): 16254 pg/mL — ABNORMAL HIGH (ref 0–450)

## 2014-06-03 LAB — VANCOMYCIN, RANDOM: Vancomycin Rm: 25.7 ug/mL

## 2014-06-03 LAB — I-STAT TROPONIN, ED: Troponin i, poc: 0.04 ng/mL (ref 0.00–0.08)

## 2014-06-03 NOTE — Discharge Instructions (Signed)
There was no clear cause for your shortness of breath, today. Your blood count is somewhat lower than it was previously, it is a. 8.6, today. Start taking one iron pill, twice a day. When you see her cardiologist this week, ask him about your elevated BNP. Return here, if needed, for problems.    Shortness of Breath Shortness of breath means you have trouble breathing. Shortness of breath may indicate that you have a medical problem. You should seek immediate medical care for shortness of breath. CAUSES   Not enough oxygen in the air (as with high altitudes or a smoke-filled room).  Short-term (acute) lung disease, including:  Infections, such as pneumonia.  Fluid in the lungs, such as heart failure.  A blood clot in the lungs (pulmonary embolism).  Long-term (chronic) lung diseases.  Heart disease (heart attack, angina, heart failure, and others).  Low red blood cells (anemia).  Poor physical fitness. This can cause shortness of breath when you exercise.  Chest or back injuries or stiffness.  Being overweight.  Smoking.  Anxiety. This can make you feel like you are not getting enough air. DIAGNOSIS  Serious medical problems can usually be found during your physical exam. Tests may also be done to determine why you are having shortness of breath. Tests may include:  Chest X-rays.  Lung function tests.  Blood tests.  Electrocardiography.  Exercise testing.  Echocardiography.  Imaging scans. Your caregiver may not be able to find a cause for your shortness of breath after your exam. In this case, it is important to have a follow-up exam with your caregiver as directed.  TREATMENT  Treatment for shortness of breath depends on the cause of your symptoms and can vary greatly. HOME CARE INSTRUCTIONS   Do not smoke. Smoking is a common cause of shortness of breath. If you smoke, ask for help to quit.  Avoid being around chemicals or things that may bother your  breathing, such as paint fumes and dust.  Rest as needed. Slowly resume your usual activities.  If medicines were prescribed, take them as directed for the full length of time directed. This includes oxygen and any inhaled medicines.  Keep all follow-up appointments as directed by your caregiver. SEEK MEDICAL CARE IF:   Your condition does not improve in the time expected.  You have a hard time doing your normal activities even with rest.  You have any side effects or problems with the medicines prescribed.  You develop any new symptoms. SEEK IMMEDIATE MEDICAL CARE IF:   Your shortness of breath gets worse.  You feel lightheaded, faint, or develop a cough not controlled with medicines.  You start coughing up blood.  You have pain with breathing.  You have chest pain or pain in your arms, shoulders, or abdomen.  You have a fever.  You are unable to walk up stairs or exercise the way you normally do. MAKE SURE YOU:  Understand these instructions.  Will watch your condition.  Will get help right away if you are not doing well or get worse. Document Released: 08/30/2001 Document Revised: 06/05/2012 Document Reviewed: 02/20/2012 [facility] Patient Information 2014 [place].  Iron Deficiency Anemia, Adult Anemia is when you have a low number of healthy red blood cells. It is often caused by too little iron. This is called iron deficiency anemia. It may make you tired and short of breath. HOME CARE   Take iron as told by your doctor.  Take vitamins as told by  your doctor.  Eat foods that have iron in them. This includes liver, lean beef, whole-grain bread, eggs, dried fruit, and dark green leafy vegetables. GET HELP RIGHT AWAY IF:  You pass out (faint).  You have chest pain.  You feel sick to your stomach (nauseous) or throw up (vomit).  You get very short of breath with activity.  You are weak.  You have a fast heartbeat.  You start to sweat for no  reason.  You become lightheaded when getting up from a chair or bed. MAKE SURE YOU:  Understand these instructions.  Will watch your condition.  Will get help right away if you are not doing well or get worse. Document Released: 01/07/2011 Document Revised: 09/25/2013 Document Reviewed: 08/12/2013 [facility] Patient Information 2014 [place].

## 2014-06-03 NOTE — ED Notes (Signed)
Patient presents today with a chief complaint of shortness of breath upon exertion that has worsened over the past 2 days. Patient has history of heart failure and is recovering from multiple leg surgeries [facility]) since February with last I&D on 05/04/14.

## 2014-06-03 NOTE — ED Notes (Signed)
Pt A&OX4, wheeled out of ED by this RN via wheelchair. NAD.

## 2014-06-03 NOTE — ED Provider Notes (Signed)
CSN: [id number]     Arrival date & time 06/03/14  1641 History   First MD Initiated Contact with Patient 06/03/14 2045     Chief Complaint  Patient presents with  . Shortness of Breath     (Consider location/radiation/quality/duration/timing/severity/associated sxs/prior Treatment) Patient is a 80 y.o. female presenting with shortness of breath. The history is provided by the patient.  Shortness of Breath  She presents for evaluation of dyspnea on exertion, present for 3 days, and worsening. She's being treated for a right leg infection with vancomycin, daily. She denies chest pain, fever, chills, cough, nausea, vomiting, dysuria, hematuria, blood in stool, diarrhea, weakness or paresthesia. She had surgery on a right leg wound infection, one month ago. She is taking her medicine as prescribed. There are no other known modifying factors.   Past Medical History  Diagnosis Date  . Hypertension   . Mitral regurgitation   . Hypertrophic obstructive cardiomyopathy     Required septal myotomy  . Urinary incontinence   . Cystitis   . Hemorrhoids   . Bronchitis   . Asthma   . Anemia   . Heart block     Complete heart block post surgical requiring pacer  . Hypokalemia     Postoperative, improved  . Hyponatremia     Mild postoperative, improved  . Blood transfusion   . Constipation   . Arthritis pain   . Cancer     lt breast   Past Surgical History  Procedure Laterality Date  . Anterior cervical diskectomy and fusion      C-7  . Septal myotomy    . Pacemaker placement  2006/11-27-2013    MDT dual chamber pacemaker implanted 2006 with gen change by Dr Lovena Le 11-27-2013  . Replacement total knee bilateral    . Femur fracture surgery      Right  . Wrist fracture surgery      Right  . Shoulder surgery      Right  . Breast biopsy      Left, negative  . Colonoscopy    . Abdominal hysterectomy    . Cardiomyopathy    . Breast lumpectomy  2012    left breast   Family History   Problem Relation Age of Onset  . Heart disease Mother   . Hypertension Mother   . Pancreatic cancer Mother   . Cancer Mother     pancreatic  . Pancreatic cancer Father   . Cancer Father     pancreatic  . Diabetes Brother   . Pancreatic cancer Other   . Heart disease Brother     Enlarged heart   History  Substance Use Topics  . Smoking status: Never Smoker   . Smokeless tobacco: Not on file  . Alcohol Use: No   OB History   Grav Para Term Preterm Abortions TAB SAB Ect Mult Living                 Review of Systems  Respiratory: Positive for shortness of breath.   All other systems reviewed and are negative.     Allergies  Codeine and Morphine  Home Medications   Prior to Admission medications   Medication Sig Start Date End Date Taking? Authorizing Provider  acetaminophen (TYLENOL) 500 MG tablet Take by mouth.   Yes Historical Provider, MD  albuterol (PROAIR HFA) 108 (90 BASE) MCG/ACT inhaler Inhale 2 puffs into the lungs every 6 (six) hours as needed for wheezing.    Yes  Historical Provider, MD  alendronate (FOSAMAX) 70 MG tablet Take 70 mg by mouth every 7 (seven) days. Take with a full glass of water on an empty stomach. Takes on Tuesdays   Yes Historical Provider, MD  amLODipine (NORVASC) 2.5 MG tablet Take 2.5 mg by mouth daily.   Yes Historical Provider, MD  apixaban (ELIQUIS) 2.5 MG TABS tablet Take 2.5 mg by mouth 2 (two) times daily. 07/15/13  Yes Evans Lance, MD  calcium citrate-vitamin D (CITRACAL+D) 315-200 MG-UNIT per tablet Take 1 tablet by mouth 2 (two) times daily.   Yes Historical Provider, MD  cyclobenzaprine (FLEXERIL) 5 MG tablet Take 5 mg by mouth daily.   Yes Historical Provider, MD  cycloSPORINE (RESTASIS) 0.05 % ophthalmic emulsion Place 1 drop into both eyes 2 (two) times daily.    Yes Historical Provider, MD  fluticasone (FLONASE) 50 MCG/ACT nasal spray Place 1 spray into both nostrils daily.   Yes Historical Provider, MD  meloxicam (MOBIC)  15 MG tablet Take 15 mg by mouth daily.   Yes Historical Provider, MD  metoprolol (TOPROL-XL) 100 MG 24 hr tablet Take 100 mg by mouth daily.    Yes Historical Provider, MD  mirtazapine (REMERON) 15 MG tablet Take 15 mg by mouth at bedtime.   Yes Historical Provider, MD  Multiple Vitamin (MULTIVITAMIN) tablet Take 1 tablet by mouth daily.    Yes Historical Provider, MD  traMADol (ULTRAM-ER) 300 MG 24 hr tablet Take 600 mg by mouth every 4 (four) hours as needed (for knee pain).    Yes Historical Provider, MD  vancomycin 1,000 mg in sodium chloride 0.9 % 250 mL Inject 1,000 mg into the vein daily.   Yes Historical Provider, MD  furosemide (LASIX) 20 MG tablet Take by mouth. 03/26/14 04/02/14  Historical Provider, MD  lisinopril (PRINIVIL,ZESTRIL) 5 MG tablet Take by mouth. 03/26/14 04/25/14  Historical Provider, MD   BP 107/74  Pulse 60  Temp(Src) 97.6 F (36.4 C) (Oral)  Resp 17  SpO2 99% Physical Exam  Nursing note and vitals reviewed. Constitutional: She is oriented to person, place, and time. She appears well-developed.  Elderly, frail  HENT:  Head: Normocephalic and atraumatic.  Eyes: Conjunctivae and EOM are normal. Pupils are equal, round, and reactive to light.  Neck: Normal range of motion and phonation normal. Neck supple.  Cardiovascular: Normal rate, regular rhythm and intact distal pulses.   Pulmonary/Chest: Effort normal and breath sounds normal. She exhibits no tenderness.  Abdominal: Soft. She exhibits no distension. There is no tenderness. There is no guarding.  Musculoskeletal: Normal range of motion.  Right leg has tibial deformity, and healed wounds. There are sutures in the wounds, that are not holding the wounds closed, and thus need removal today.  Neurological: She is alert and oriented to person, place, and time. She exhibits normal muscle tone.  Skin: Skin is warm and dry.  Psychiatric: She has a normal mood and affect. Her behavior is normal. Judgment and thought  content normal.    ED Course  Procedures (including critical care time) Medications - No data to display  Patient Vitals for the past 24 hrs:  BP Temp Temp src Pulse Resp SpO2  06/03/14 2330 107/74 mmHg - - 60 17 99 %  06/03/14 2300 115/70 mmHg - - 60 17 94 %  06/03/14 2200 122/73 mmHg - - 58 20 95 %  06/03/14 2115 112/62 mmHg - - 58 18 99 %  06/03/14 2045 114/66 mmHg - - 59  15 99 %  06/03/14 2035 - - - - - 100 %  06/03/14 2035 - - - - - 90 %  06/03/14 1651 111/59 mmHg 97.6 F (36.4 C) Oral 73 18 95 %       Labs Review Labs Reviewed  CBC - Abnormal; Notable for the following:    RBC 3.35 (*)    Hemoglobin 8.6 (*)    HCT 28.3 (*)    MCH 25.7 (*)    Platelets 132 (*)    All other components within normal limits  BASIC METABOLIC PANEL - Abnormal; Notable for the following:    Sodium 134 (*)    Glucose, Bld 111 (*)    BUN 25 (*)    Creatinine, Ser 1.67 (*)    GFR calc non Af Amer 28 (*)    GFR calc Af Amer 32 (*)    All other components within normal limits  PRO B NATRIURETIC PEPTIDE - Abnormal; Notable for the following:    Pro B Natriuretic peptide (BNP) 16254.0 (*)    All other components within normal limits  VANCOMYCIN, RANDOM  URINALYSIS, ROUTINE W REFLEX MICROSCOPIC  SEDIMENTATION RATE  C-REACTIVE PROTEIN  I-STAT TROPOININ, ED  POC OCCULT BLOOD, ED    Lab Comparison: Padroni, 5/7- 9.6/31%; 4/7 9.3/ 29%  Imaging Review Dg Chest 2 View  06/03/2014   CLINICAL DATA:  Short of breath.  EXAM: CHEST  2 VIEW  COMPARISON:  03/29/2011.  FINDINGS: Cardiomegaly. Median sternotomy. Dual lead right subclavian pacemaker. Left upper extremity PICC with the tip at the cavoatrial junction. Right shoulder hemiarthroplasty. Lower cervical ACDF. Patient rotated to the right. There is no airspace disease. Increased AP diameter of the chest, probably due to emphysema.  IMPRESSION: No acute cardiopulmonary disease. Hyper expansion of the chest in the AP dimension  suggesting emphysema.   Electronically Signed   By: Dereck Ligas M.D.   On: 06/03/2014 22:44     EKG Interpretation None      MDM   Final diagnoses:  Anemia  Dyspnea    Nonspecific dyspnea with anemia and somewhat lower . Hemoglobin, then 6 weeks ago. No apparent site for blood loss. Patient does not have an oxygen    Nursing Notes Reviewed/ Care Coordinated Applicable Imaging Reviewed Interpretation of Laboratory Data incorporated into ED treatment  The patient appears reasonably screened and/or stabilized for discharge and I doubt any other medical condition or other [facility] requiring further screening, evaluation, or treatment in the ED at this time prior to discharge.  Plan: Home Medications- usual; Home Treatments- rest; return here if the recommended treatment, does not improve the symptoms; Recommended follow up- PCP 3-4 days   Richarda Blade, MD 06/04/14 [phone]

## 2014-06-04 DIAGNOSIS — J45909 Unspecified asthma, uncomplicated: Secondary | ICD-10-CM | POA: Diagnosis not present

## 2014-06-04 DIAGNOSIS — I739 Peripheral vascular disease, unspecified: Secondary | ICD-10-CM | POA: Diagnosis not present

## 2014-06-04 DIAGNOSIS — T8140XA Infection following a procedure, unspecified, initial encounter: Secondary | ICD-10-CM | POA: Diagnosis not present

## 2014-06-04 DIAGNOSIS — I10 Essential (primary) hypertension: Secondary | ICD-10-CM | POA: Diagnosis not present

## 2014-06-04 DIAGNOSIS — I509 Heart failure, unspecified: Secondary | ICD-10-CM | POA: Diagnosis not present

## 2014-06-04 DIAGNOSIS — L02419 Cutaneous abscess of limb, unspecified: Secondary | ICD-10-CM | POA: Diagnosis not present

## 2014-06-04 LAB — C-REACTIVE PROTEIN: CRP: <0.5 mg/dL — ABNORMAL LOW (ref <0.60)

## 2014-06-05 ENCOUNTER — Ambulatory Visit (INDEPENDENT_AMBULATORY_CARE_PROVIDER_SITE_OTHER): Payer: Medicare Other | Admitting: Internal Medicine

## 2014-06-05 ENCOUNTER — Encounter: Payer: Self-pay | Admitting: Internal Medicine

## 2014-06-05 VITALS — BP 110/74 | HR 74 | Ht 60.0 in | Wt 145.8 lb

## 2014-06-05 DIAGNOSIS — I4891 Unspecified atrial fibrillation: Secondary | ICD-10-CM | POA: Diagnosis not present

## 2014-06-05 DIAGNOSIS — I421 Obstructive hypertrophic cardiomyopathy: Secondary | ICD-10-CM

## 2014-06-05 DIAGNOSIS — Z950 Presence of cardiac pacemaker: Secondary | ICD-10-CM

## 2014-06-05 DIAGNOSIS — I442 Atrioventricular block, complete: Secondary | ICD-10-CM

## 2014-06-05 DIAGNOSIS — J45909 Unspecified asthma, uncomplicated: Secondary | ICD-10-CM

## 2014-06-05 LAB — MDC_IDC_ENUM_SESS_TYPE_INCLINIC
Battery Impedance: 100 Ohm
Battery Remaining Longevity: 151 mo
Battery Voltage: 2.79 V
Brady Statistic RV Percent Paced: 99 %
Date Time Interrogation Session: 20150618131010
Lead Channel Impedance Value: 518 Ohm
Lead Channel Impedance Value: 67 Ohm
Lead Channel Pacing Threshold Amplitude: 0.75 V
Lead Channel Pacing Threshold Pulse Width: 0.4 ms
Lead Channel Sensing Intrinsic Amplitude: 1 mV
Lead Channel Setting Pacing Amplitude: 2.5 V
Lead Channel Setting Pacing Pulse Width: 0.4 ms
Lead Channel Setting Sensing Sensitivity: 4 mV

## 2014-06-05 MED ORDER — ALBUTEROL SULFATE HFA 108 (90 BASE) MCG/ACT IN AERS: 2.0000 | INHALATION_SPRAY | Freq: Four times a day (QID) | RESPIRATORY_TRACT | Status: DC | PRN

## 2014-06-05 NOTE — Assessment & Plan Note (Signed)
Her Medtronic PPM is working normally. As she appears to be chronically in atrial fib, will reprogram her device to VVIR at 60/min.

## 2014-06-05 NOTE — Assessment & Plan Note (Signed)
She is out of her bronchodilators and is sob with minimal wheezes. Will refill her prescription.

## 2014-06-05 NOTE — Assessment & Plan Note (Signed)
She appears to be chronically in atrial fib. Her ventricular rate is well controlled and she is anti-coagulated. No change in her meds.

## 2014-06-05 NOTE — Patient Instructions (Signed)
Remote monitoring is used to monitor your pacemaker from home. This monitoring reduces the number of office visits required to check your device to one time per year. It allows [place] to keep an eye on the functioning of your device to ensure it is working properly. You are scheduled for a device check from home on 09-08-2014. You may send your transmission at any time that day. If you have a wireless device, the transmission will be sent automatically. After your physician reviews your transmission, you will receive a postcard with your next transmission date.  Your physician recommends that you schedule a follow-up appointment in: 12 months with Dr.Taylor

## 2014-06-05 NOTE — Progress Notes (Signed)
HPI [name] returns today for followup. She is a very pleasant 80 year old woman with a history of complete heart block, status post permanent pacemaker insertion, hypertension, and arthritis. In the interim she has been stable except for pain with her right lower leg. She has undergone pacemaker generator change out, complicated by a hematoma which has resolved. She denies chest pain or syncope. She has previously had preserved LV function but has not had an echo in several years. Her CHF symptoms are class 1 but she is limited by a sedentary lifestyle. She is being treated at [facility] for a right leg wound and the leg is wrapped. She has been out of her bronchodilators and c/o sob. Allergies  Allergen Reactions  . Codeine Nausea And Vomiting  . Morphine Nausea And Vomiting  . Sulfa Antibiotics Nausea Only and Nausea And Vomiting  . Ceftaroline Rash     Current Outpatient Prescriptions  Medication Sig Dispense Refill  . acetaminophen (TYLENOL) 500 MG tablet Take 500 mg by mouth every 6 (six) hours as needed.       Marland Kitchen albuterol (PROAIR HFA) 108 (90 BASE) MCG/ACT inhaler Inhale 2 puffs into the lungs every 6 (six) hours as needed for wheezing.       Marland Kitchen alendronate (FOSAMAX) 70 MG tablet Take 70 mg by mouth every 7 (seven) days. Take with a full glass of water on an empty stomach. Takes on Tuesdays      . amLODipine (NORVASC) 2.5 MG tablet Take 2.5 mg by mouth daily.      Marland Kitchen apixaban (ELIQUIS) 2.5 MG TABS tablet Take 2.5 mg by mouth 2 (two) times daily.      . calcium citrate-vitamin D (CITRACAL+D) 315-200 MG-UNIT per tablet Take 1 tablet by mouth 2 (two) times daily.      . cyclobenzaprine (FLEXERIL) 5 MG tablet Take 5 mg by mouth daily.      . cycloSPORINE (RESTASIS) 0.05 % ophthalmic emulsion Place 1 drop into both eyes 2 (two) times daily.       . fluticasone (FLONASE) 50 MCG/ACT nasal spray Place 1 spray into both nostrils daily.      . furosemide (LASIX) 20 MG tablet Take 20 mg by  mouth daily.       . hydrOXYzine (ATARAX/VISTARIL) 25 MG tablet Take 25 mg by mouth 3 (three) times daily as needed.      Marland Kitchen lisinopril (PRINIVIL,ZESTRIL) 5 MG tablet Take 5 mg by mouth daily.       . meloxicam (MOBIC) 15 MG tablet Take 15 mg by mouth daily.      . metoprolol (TOPROL-XL) 100 MG 24 hr tablet Take 100 mg by mouth daily.       . mirtazapine (REMERON) 15 MG tablet Take 15 mg by mouth at bedtime.      . Multiple Vitamin (MULTIVITAMIN) tablet Take 1 tablet by mouth daily.       Marland Kitchen oxycodone (OXY-IR) 5 MG capsule Take 1 capsule by mouth as needed.      . senna (SENOKOT) 8.6 MG tablet Take 1 tablet by mouth as needed.      . traMADol (ULTRAM-ER) 300 MG 24 hr tablet Take 600 mg by mouth every 4 (four) hours as needed (for knee pain).       . vancomycin 1,000 mg in sodium chloride 0.9 % 250 mL Inject 1,000 mg into the vein daily.       No current facility-administered medications for this visit.  Past Medical History  Diagnosis Date  . Hypertension   . Mitral regurgitation   . Hypertrophic obstructive cardiomyopathy     Required septal myotomy  . Urinary incontinence   . Cystitis   . Hemorrhoids   . Bronchitis   . Asthma   . Anemia   . Heart block     Complete heart block post surgical requiring pacer  . Hypokalemia     Postoperative, improved  . Hyponatremia     Mild postoperative, improved  . Blood transfusion   . Constipation   . Arthritis pain   . Cancer     lt breast    ROS:   All systems reviewed and negative except as noted in the HPI.   Past Surgical History  Procedure Laterality Date  . Anterior cervical diskectomy and fusion      C-7  . Septal myotomy    . Pacemaker placement  2006/11-27-2013    MDT dual chamber pacemaker implanted 2006 with gen change by Dr Lovena Le 11-27-2013  . Replacement total knee bilateral    . Femur fracture surgery      Right  . Wrist fracture surgery      Right  . Shoulder surgery      Right  . Breast biopsy       Left, negative  . Colonoscopy    . Abdominal hysterectomy    . Cardiomyopathy    . Breast lumpectomy  2012    left breast     Family History  Problem Relation Age of Onset  . Heart disease Mother   . Hypertension Mother   . Pancreatic cancer Mother   . Cancer Mother     pancreatic  . Pancreatic cancer Father   . Cancer Father     pancreatic  . Diabetes Brother   . Pancreatic cancer Other   . Heart disease Brother     Enlarged heart     History   Social History  . Marital Status: Married    Spouse Name: N/A    Number of Children: N/A  . Years of Education: N/A   Occupational History  . Not on file.   Social History Main Topics  . Smoking status: Never Smoker   . Smokeless tobacco: Not on file  . Alcohol Use: No  . Drug Use: No  . Sexual Activity: Not Currently   Other Topics Concern  . Not on file   Social History Narrative   Married   Two children   Husband will be assisting with care after surgery     BP 110/74  Pulse 74  Ht 5' (1.524 m)  Wt 145 lb 12.8 oz (66.134 kg)  BMI 28.47 kg/m2  SpO2 94%  Physical Exam:  Well appearing 80 year old woman, NAD HEENT: Unremarkable Neck:  No JVD, no thyromegally Back:  No CVA tenderness Lungs:  Clear except for scattered wheezes, but no rales or rhonchi. HEART:  Regular rate rhythm, no murmurs, no rubs, no clicks Abd:  soft, positive bowel sounds, no organomegally, no rebound, no guarding Ext:  2 plus pulses, no edema, no cyanosis, no clubbing, right lower leg brace in place. Right leg is wrapped. Skin:  No rashes no nodules Neuro:  CN II through XII intact, motor grossly intact  DEVICE  Normal device function.  See PaceArt for details.   Assess/Plan:

## 2014-06-06 DIAGNOSIS — I509 Heart failure, unspecified: Secondary | ICD-10-CM | POA: Diagnosis not present

## 2014-06-06 DIAGNOSIS — J45909 Unspecified asthma, uncomplicated: Secondary | ICD-10-CM | POA: Diagnosis not present

## 2014-06-06 DIAGNOSIS — T8140XA Infection following a procedure, unspecified, initial encounter: Secondary | ICD-10-CM | POA: Diagnosis not present

## 2014-06-06 DIAGNOSIS — I10 Essential (primary) hypertension: Secondary | ICD-10-CM | POA: Diagnosis not present

## 2014-06-06 DIAGNOSIS — L02419 Cutaneous abscess of limb, unspecified: Secondary | ICD-10-CM | POA: Diagnosis not present

## 2014-06-06 DIAGNOSIS — I739 Peripheral vascular disease, unspecified: Secondary | ICD-10-CM | POA: Diagnosis not present

## 2014-06-09 DIAGNOSIS — I739 Peripheral vascular disease, unspecified: Secondary | ICD-10-CM | POA: Diagnosis not present

## 2014-06-09 DIAGNOSIS — J45909 Unspecified asthma, uncomplicated: Secondary | ICD-10-CM | POA: Diagnosis not present

## 2014-06-09 DIAGNOSIS — I509 Heart failure, unspecified: Secondary | ICD-10-CM | POA: Diagnosis not present

## 2014-06-09 DIAGNOSIS — L02419 Cutaneous abscess of limb, unspecified: Secondary | ICD-10-CM | POA: Diagnosis not present

## 2014-06-09 DIAGNOSIS — I10 Essential (primary) hypertension: Secondary | ICD-10-CM | POA: Diagnosis not present

## 2014-06-09 DIAGNOSIS — T8140XA Infection following a procedure, unspecified, initial encounter: Secondary | ICD-10-CM | POA: Diagnosis not present

## 2014-06-11 ENCOUNTER — Other Ambulatory Visit: Payer: Self-pay

## 2014-06-11 MED ORDER — FUROSEMIDE 20 MG PO TABS: 20.0000 mg | ORAL_TABLET | Freq: Every day | ORAL | Status: DC

## 2014-06-11 MED ORDER — LISINOPRIL 5 MG PO TABS: 5.0000 mg | ORAL_TABLET | Freq: Every day | ORAL | Status: DC

## 2014-06-14 DIAGNOSIS — L02419 Cutaneous abscess of limb, unspecified: Secondary | ICD-10-CM | POA: Diagnosis not present

## 2014-06-14 DIAGNOSIS — J45909 Unspecified asthma, uncomplicated: Secondary | ICD-10-CM | POA: Diagnosis not present

## 2014-06-14 DIAGNOSIS — I509 Heart failure, unspecified: Secondary | ICD-10-CM | POA: Diagnosis not present

## 2014-06-14 DIAGNOSIS — T8140XA Infection following a procedure, unspecified, initial encounter: Secondary | ICD-10-CM | POA: Diagnosis not present

## 2014-06-14 DIAGNOSIS — I739 Peripheral vascular disease, unspecified: Secondary | ICD-10-CM | POA: Diagnosis not present

## 2014-06-14 DIAGNOSIS — I10 Essential (primary) hypertension: Secondary | ICD-10-CM | POA: Diagnosis not present

## 2014-06-16 DIAGNOSIS — J45909 Unspecified asthma, uncomplicated: Secondary | ICD-10-CM | POA: Diagnosis not present

## 2014-06-16 DIAGNOSIS — T8140XA Infection following a procedure, unspecified, initial encounter: Secondary | ICD-10-CM | POA: Diagnosis not present

## 2014-06-16 DIAGNOSIS — I739 Peripheral vascular disease, unspecified: Secondary | ICD-10-CM | POA: Diagnosis not present

## 2014-06-16 DIAGNOSIS — L02419 Cutaneous abscess of limb, unspecified: Secondary | ICD-10-CM | POA: Diagnosis not present

## 2014-06-16 DIAGNOSIS — I10 Essential (primary) hypertension: Secondary | ICD-10-CM | POA: Diagnosis not present

## 2014-06-16 DIAGNOSIS — I509 Heart failure, unspecified: Secondary | ICD-10-CM | POA: Diagnosis not present

## 2014-06-17 DIAGNOSIS — Z5189 Encounter for other specified aftercare: Secondary | ICD-10-CM | POA: Diagnosis not present

## 2014-06-17 DIAGNOSIS — Z792 Long term (current) use of antibiotics: Secondary | ICD-10-CM | POA: Diagnosis not present

## 2014-06-18 DIAGNOSIS — I421 Obstructive hypertrophic cardiomyopathy: Secondary | ICD-10-CM | POA: Diagnosis not present

## 2014-06-18 DIAGNOSIS — I10 Essential (primary) hypertension: Secondary | ICD-10-CM | POA: Diagnosis not present

## 2014-06-18 DIAGNOSIS — I2789 Other specified pulmonary heart diseases: Secondary | ICD-10-CM | POA: Diagnosis not present

## 2014-06-18 DIAGNOSIS — Z950 Presence of cardiac pacemaker: Secondary | ICD-10-CM | POA: Diagnosis not present

## 2014-06-18 DIAGNOSIS — I509 Heart failure, unspecified: Secondary | ICD-10-CM | POA: Diagnosis not present

## 2014-06-18 DIAGNOSIS — I739 Peripheral vascular disease, unspecified: Secondary | ICD-10-CM | POA: Diagnosis not present

## 2014-06-18 DIAGNOSIS — L02419 Cutaneous abscess of limb, unspecified: Secondary | ICD-10-CM | POA: Diagnosis not present

## 2014-06-18 DIAGNOSIS — I4891 Unspecified atrial fibrillation: Secondary | ICD-10-CM | POA: Diagnosis not present

## 2014-06-18 DIAGNOSIS — J45909 Unspecified asthma, uncomplicated: Secondary | ICD-10-CM | POA: Diagnosis not present

## 2014-06-18 DIAGNOSIS — T8140XA Infection following a procedure, unspecified, initial encounter: Secondary | ICD-10-CM | POA: Diagnosis not present

## 2014-06-19 DIAGNOSIS — I4891 Unspecified atrial fibrillation: Secondary | ICD-10-CM | POA: Diagnosis not present

## 2014-06-19 DIAGNOSIS — D638 Anemia in other chronic diseases classified elsewhere: Secondary | ICD-10-CM | POA: Diagnosis not present

## 2014-06-20 DIAGNOSIS — I10 Essential (primary) hypertension: Secondary | ICD-10-CM | POA: Diagnosis not present

## 2014-06-20 DIAGNOSIS — L02419 Cutaneous abscess of limb, unspecified: Secondary | ICD-10-CM | POA: Diagnosis not present

## 2014-06-20 DIAGNOSIS — I739 Peripheral vascular disease, unspecified: Secondary | ICD-10-CM | POA: Diagnosis not present

## 2014-06-20 DIAGNOSIS — T8140XA Infection following a procedure, unspecified, initial encounter: Secondary | ICD-10-CM | POA: Diagnosis not present

## 2014-06-20 DIAGNOSIS — I509 Heart failure, unspecified: Secondary | ICD-10-CM | POA: Diagnosis not present

## 2014-06-20 DIAGNOSIS — J45909 Unspecified asthma, uncomplicated: Secondary | ICD-10-CM | POA: Diagnosis not present

## 2014-06-25 DIAGNOSIS — R29898 Other symptoms and signs involving the musculoskeletal system: Secondary | ICD-10-CM | POA: Diagnosis not present

## 2014-06-27 DIAGNOSIS — R29898 Other symptoms and signs involving the musculoskeletal system: Secondary | ICD-10-CM | POA: Diagnosis not present

## 2014-07-01 DIAGNOSIS — R29898 Other symptoms and signs involving the musculoskeletal system: Secondary | ICD-10-CM | POA: Diagnosis not present

## 2014-07-03 DIAGNOSIS — R29898 Other symptoms and signs involving the musculoskeletal system: Secondary | ICD-10-CM | POA: Diagnosis not present

## 2014-07-08 DIAGNOSIS — R29898 Other symptoms and signs involving the musculoskeletal system: Secondary | ICD-10-CM | POA: Diagnosis not present

## 2014-07-10 DIAGNOSIS — M6281 Muscle weakness (generalized): Secondary | ICD-10-CM | POA: Diagnosis not present

## 2014-07-15 DIAGNOSIS — D638 Anemia in other chronic diseases classified elsewhere: Secondary | ICD-10-CM | POA: Diagnosis not present

## 2014-07-15 DIAGNOSIS — M6281 Muscle weakness (generalized): Secondary | ICD-10-CM | POA: Diagnosis not present

## 2014-07-17 ENCOUNTER — Other Ambulatory Visit: Payer: Self-pay | Admitting: Internal Medicine

## 2014-07-17 DIAGNOSIS — M6281 Muscle weakness (generalized): Secondary | ICD-10-CM | POA: Diagnosis not present

## 2014-07-21 DIAGNOSIS — I7389 Other specified peripheral vascular diseases: Secondary | ICD-10-CM | POA: Diagnosis not present

## 2014-07-21 DIAGNOSIS — I87339 Chronic venous hypertension (idiopathic) with ulcer and inflammation of unspecified lower extremity: Secondary | ICD-10-CM | POA: Diagnosis not present

## 2014-07-22 DIAGNOSIS — M6281 Muscle weakness (generalized): Secondary | ICD-10-CM | POA: Diagnosis not present

## 2014-07-30 DIAGNOSIS — Z96659 Presence of unspecified artificial knee joint: Secondary | ICD-10-CM | POA: Diagnosis not present

## 2014-07-30 DIAGNOSIS — Z9889 Other specified postprocedural states: Secondary | ICD-10-CM | POA: Diagnosis not present

## 2014-07-30 DIAGNOSIS — Z471 Aftercare following joint replacement surgery: Secondary | ICD-10-CM | POA: Diagnosis not present

## 2014-07-30 DIAGNOSIS — C499 Malignant neoplasm of connective and soft tissue, unspecified: Secondary | ICD-10-CM | POA: Diagnosis not present

## 2014-07-30 DIAGNOSIS — Z4789 Encounter for other orthopedic aftercare: Secondary | ICD-10-CM | POA: Diagnosis not present

## 2014-08-12 DIAGNOSIS — Z96659 Presence of unspecified artificial knee joint: Secondary | ICD-10-CM | POA: Diagnosis not present

## 2014-08-12 DIAGNOSIS — T8489XA Other specified complication of internal orthopedic prosthetic devices, implants and grafts, initial encounter: Secondary | ICD-10-CM | POA: Diagnosis not present

## 2014-08-12 DIAGNOSIS — Z01818 Encounter for other preprocedural examination: Secondary | ICD-10-CM | POA: Diagnosis not present

## 2014-08-12 DIAGNOSIS — R9431 Abnormal electrocardiogram [ECG] [EKG]: Secondary | ICD-10-CM | POA: Diagnosis not present

## 2014-08-12 DIAGNOSIS — Z79899 Other long term (current) drug therapy: Secondary | ICD-10-CM | POA: Diagnosis not present

## 2014-08-12 DIAGNOSIS — I482 Chronic atrial fibrillation, unspecified: Secondary | ICD-10-CM | POA: Insufficient documentation

## 2014-08-12 DIAGNOSIS — I272 Pulmonary hypertension, unspecified: Secondary | ICD-10-CM | POA: Insufficient documentation

## 2014-08-12 DIAGNOSIS — Z4789 Encounter for other orthopedic aftercare: Secondary | ICD-10-CM | POA: Diagnosis not present

## 2014-08-13 DIAGNOSIS — I4891 Unspecified atrial fibrillation: Secondary | ICD-10-CM | POA: Diagnosis not present

## 2014-08-13 DIAGNOSIS — IMO0002 Reserved for concepts with insufficient information to code with codable children: Secondary | ICD-10-CM | POA: Diagnosis not present

## 2014-08-13 DIAGNOSIS — R262 Difficulty in walking, not elsewhere classified: Secondary | ICD-10-CM | POA: Diagnosis not present

## 2014-08-13 DIAGNOSIS — S81009A Unspecified open wound, unspecified knee, initial encounter: Secondary | ICD-10-CM | POA: Diagnosis not present

## 2014-08-13 DIAGNOSIS — Z79899 Other long term (current) drug therapy: Secondary | ICD-10-CM | POA: Diagnosis not present

## 2014-08-13 DIAGNOSIS — I2789 Other specified pulmonary heart diseases: Secondary | ICD-10-CM | POA: Diagnosis not present

## 2014-08-13 DIAGNOSIS — T889XXS Complication of surgical and medical care, unspecified, sequela: Secondary | ICD-10-CM | POA: Diagnosis not present

## 2014-08-13 DIAGNOSIS — S81809A Unspecified open wound, unspecified lower leg, initial encounter: Secondary | ICD-10-CM | POA: Diagnosis not present

## 2014-08-13 DIAGNOSIS — Z5189 Encounter for other specified aftercare: Secondary | ICD-10-CM | POA: Diagnosis not present

## 2014-08-14 DIAGNOSIS — I2789 Other specified pulmonary heart diseases: Secondary | ICD-10-CM | POA: Diagnosis not present

## 2014-08-14 DIAGNOSIS — IMO0002 Reserved for concepts with insufficient information to code with codable children: Secondary | ICD-10-CM | POA: Diagnosis not present

## 2014-08-14 DIAGNOSIS — S81809A Unspecified open wound, unspecified lower leg, initial encounter: Secondary | ICD-10-CM | POA: Diagnosis not present

## 2014-08-14 DIAGNOSIS — Z79899 Other long term (current) drug therapy: Secondary | ICD-10-CM | POA: Diagnosis not present

## 2014-08-14 DIAGNOSIS — R262 Difficulty in walking, not elsewhere classified: Secondary | ICD-10-CM | POA: Diagnosis not present

## 2014-08-14 DIAGNOSIS — I4891 Unspecified atrial fibrillation: Secondary | ICD-10-CM | POA: Diagnosis not present

## 2014-08-14 DIAGNOSIS — S81009A Unspecified open wound, unspecified knee, initial encounter: Secondary | ICD-10-CM | POA: Diagnosis not present

## 2014-08-18 DIAGNOSIS — R609 Edema, unspecified: Secondary | ICD-10-CM | POA: Diagnosis not present

## 2014-08-18 DIAGNOSIS — D638 Anemia in other chronic diseases classified elsewhere: Secondary | ICD-10-CM | POA: Diagnosis not present

## 2014-08-18 DIAGNOSIS — Z950 Presence of cardiac pacemaker: Secondary | ICD-10-CM | POA: Diagnosis not present

## 2014-08-18 DIAGNOSIS — I739 Peripheral vascular disease, unspecified: Secondary | ICD-10-CM | POA: Diagnosis not present

## 2014-08-18 DIAGNOSIS — I4891 Unspecified atrial fibrillation: Secondary | ICD-10-CM | POA: Diagnosis not present

## 2014-08-19 ENCOUNTER — Other Ambulatory Visit: Payer: Self-pay | Admitting: Cardiology

## 2014-08-19 DIAGNOSIS — I872 Venous insufficiency (chronic) (peripheral): Secondary | ICD-10-CM

## 2014-08-26 DIAGNOSIS — M86669 Other chronic osteomyelitis, unspecified tibia and fibula: Secondary | ICD-10-CM | POA: Diagnosis not present

## 2014-08-26 DIAGNOSIS — Z5181 Encounter for therapeutic drug level monitoring: Secondary | ICD-10-CM | POA: Diagnosis not present

## 2014-08-26 DIAGNOSIS — T8450XA Infection and inflammatory reaction due to unspecified internal joint prosthesis, initial encounter: Secondary | ICD-10-CM | POA: Diagnosis not present

## 2014-08-26 DIAGNOSIS — Z96659 Presence of unspecified artificial knee joint: Secondary | ICD-10-CM | POA: Diagnosis not present

## 2014-08-26 DIAGNOSIS — Z792 Long term (current) use of antibiotics: Secondary | ICD-10-CM | POA: Diagnosis not present

## 2014-08-26 DIAGNOSIS — Z5189 Encounter for other specified aftercare: Secondary | ICD-10-CM | POA: Diagnosis not present

## 2014-08-26 DIAGNOSIS — Z79899 Other long term (current) drug therapy: Secondary | ICD-10-CM | POA: Diagnosis not present

## 2014-08-26 DIAGNOSIS — I872 Venous insufficiency (chronic) (peripheral): Secondary | ICD-10-CM | POA: Diagnosis not present

## 2014-08-27 ENCOUNTER — Ambulatory Visit
Admission: RE | Admit: 2014-08-27 | Discharge: 2014-08-27 | Disposition: A | Discharge status: Home / Self Care | Payer: Medicare Other | Source: Ambulatory Visit | Attending: Cardiology | Admitting: Cardiology

## 2014-08-27 VITALS — BP 105/60 | HR 80 | Temp 97.8°F | Resp 13 | Ht 60.0 in | Wt 123.0 lb

## 2014-08-27 DIAGNOSIS — I872 Venous insufficiency (chronic) (peripheral): Secondary | ICD-10-CM | POA: Diagnosis not present

## 2014-08-27 DIAGNOSIS — T8489XA Other specified complication of internal orthopedic prosthetic devices, implants and grafts, initial encounter: Secondary | ICD-10-CM | POA: Diagnosis not present

## 2014-08-27 HISTORY — DX: Long term (current) use of anticoagulants: Z79.01

## 2014-08-27 HISTORY — DX: Chronic atrial fibrillation, unspecified: I48.20

## 2014-08-27 HISTORY — DX: Presence of cardiac pacemaker: Z95.0

## 2014-08-27 HISTORY — DX: Unspecified osteoarthritis, unspecified site: M19.90

## 2014-08-28 ENCOUNTER — Other Ambulatory Visit: Payer: Medicare Other

## 2014-08-28 ENCOUNTER — Inpatient Hospital Stay: Admission: RE | Admit: 2014-08-28 | Payer: Medicare Other | Source: Ambulatory Visit

## 2014-09-03 NOTE — Consult Note (Signed)
Chief Complaint: Chief Complaint  Patient presents with  . Edema    Consult for Edema of RLE    Referring Physician(s): Ganji,Jagadeesh R  History of Present Illness: [name] is a 80 y.o. female with a long and complicated history and of right lower extremity problems dating back approximately 15 years.  Specifically, she has a history of right knee arthroplasty complicated by hardware infection requiring re- implantation in 2008.  Subsequently, in February of this year she developed periprosthetic fracture requiring ORIF with gastrocnemius myocutaneous flap coverage.  Her postoperative course was again complicated this time by persistent serous drainage beginning in late February and early March.  This was initially treated conservatively with antibiotics but ultimately progressed into an operative wound infection in late March.  Operative drainage and debridement of the gastrocnemius flap was performed.  She continued to have persistent drainage requiring a second I&D procedure on May 04, 2014.  Sinus tracts were noted extending to the hardware.  She was discharged back to a skilled nursing facility and maintained on doxycycline for suppression of further infection.  Unfortunately, a third incision and drainage and debridement procedure was required on 08/13/2014 for continued persistent drainage.    She is referred today to evaluate for venous insufficiency at the kind request of her cardiologist, Dr. Einar Gip.  This is equal is a very pleasant 80 year old female.  She is ambulating but with a difficulty.  She states that she feels okay other than the issues with continued drainage from her right leg.  She denies pain at baseline although she is unable to ambulate as well as she would like.  She currently has a JP drain in place.  She denies fever, chills, shortness of breath, dyspnea on exertion, chest pain.    When I asked her specifically about her right lower extremity symptoms she  finds it somewhat difficult to answer because she has had problems with this leg for so long with intermittent infections and hardware related fractures. Elucidating symptoms of venous insufficiency is difficult secondary to the underlying surgical history.  She does not standing for long periods of time at all and nodes little change knee amount of swelling or discomfort at the beginning of the day versus the end of the day.  Her main concern is the inability of her leg to heal and persistent issues with drainage and recurrent infection.     Past Medical History  Diagnosis Date  . Mitral regurgitation   . Hypertrophic obstructive cardiomyopathy     Required septal myotomy  . Urinary incontinence   . Cystitis   . Hemorrhoids   . Bronchitis   . Asthma   . Anemia   . Heart block     Complete heart block post surgical requiring pacer  . Hypokalemia     Postoperative, improved  . Hyponatremia     Mild postoperative, improved  . Blood transfusion   . Constipation   . Arthritis pain   . Cancer     lt breast  . Hypertension     Benign Essential Hypertension    . Current use of long term anticoagulation   . Chronic atrial fibrillation   . Arthritis   . Pacemaker     Past Surgical History  Procedure Laterality Date  . Anterior cervical diskectomy and fusion      C-7  . Septal myotomy    . Pacemaker placement  2006/11-27-2013    MDT dual chamber pacemaker implanted 2006 with  gen change by Dr Lovena Le 11-27-2013  . Replacement total knee bilateral    . Femur fracture surgery      Right  . Wrist fracture surgery      Right  . Shoulder surgery      Right  . Breast biopsy      Left, negative  . Colonoscopy    . Abdominal hysterectomy    . Cardiomyopathy    . Breast lumpectomy  2012    left breast  . Joint replacement      Allergies: Codeine; Morphine; Sulfa antibiotics; and Ceftaroline  Medications: Prior to Admission medications   Medication Sig Start Date End Date  Taking? Authorizing Provider  acetaminophen (TYLENOL) 500 MG tablet Take 500 mg by mouth every 6 (six) hours as needed.    Yes Historical Provider, MD  alendronate (FOSAMAX) 70 MG tablet Take 70 mg by mouth every 7 (seven) days. Take with a full glass of water on an empty stomach. Takes on Tuesdays   Yes Historical Provider, MD  amLODipine (NORVASC) 2.5 MG tablet Take 2.5 mg by mouth daily.   Yes Historical Provider, MD  cyclobenzaprine (FLEXERIL) 5 MG tablet Take 5 mg by mouth daily.   Yes Historical Provider, MD  cycloSPORINE (RESTASIS) 0.05 % ophthalmic emulsion Place 1 drop into both eyes 2 (two) times daily.    Yes Historical Provider, MD  doxycycline (DORYX) 100 MG DR capsule Take 100 mg by mouth 2 (two) times daily.   Yes Historical Provider, MD  ELIQUIS 2.5 MG TABS tablet TAKE 1 TABLET TWO TIMES A DAY   Yes Evans Lance, MD  fluticasone [facility]) 50 MCG/ACT nasal spray Place 1 spray into both nostrils daily.   Yes Historical Provider, MD  furosemide (LASIX) 20 MG tablet Take 1 tablet (20 mg total) by mouth daily. 06/11/14 08/27/14 Yes Evans Lance, MD  hydrOXYzine (ATARAX/VISTARIL) 25 MG tablet Take 25 mg by mouth 3 (three) times daily as needed.   Yes Historical Provider, MD  lisinopril (PRINIVIL,ZESTRIL) 5 MG tablet Take 1 tablet (5 mg total) by mouth daily. 06/11/14 08/27/14 Yes Evans Lance, MD  meloxicam (MOBIC) 15 MG tablet Take 15 mg by mouth daily.   Yes Historical Provider, MD  metoprolol (TOPROL-XL) 100 MG 24 hr tablet Take 100 mg by mouth daily.    Yes Historical Provider, MD  mirtazapine (REMERON) 15 MG tablet Take 15 mg by mouth at bedtime.   Yes Historical Provider, MD  traMADol (ULTRAM-ER) 300 MG 24 hr tablet Take 600 mg by mouth every 4 (four) hours as needed (for knee pain).    Yes Historical Provider, MD  albuterol (PROAIR HFA) 108 (90 BASE) MCG/ACT inhaler Inhale 2 puffs into the lungs every 6 (six) hours as needed for wheezing. 06/05/14   Evans Lance, MD  calcium  citrate-vitamin D (CITRACAL+D) 315-200 MG-UNIT per tablet Take 1 tablet by mouth 2 (two) times daily.    Historical Provider, MD  Multiple Vitamin (MULTIVITAMIN) tablet Take 1 tablet by mouth daily.     Historical Provider, MD  oxycodone (OXY-IR) 5 MG capsule Take 1 capsule by mouth as needed.    Historical Provider, MD  senna (SENOKOT) 8.6 MG tablet Take 1 tablet by mouth as needed. 05/06/14 05/06/15  Historical Provider, MD  vancomycin 1,000 mg in sodium chloride 0.9 % 250 mL Inject 1,000 mg into the vein daily.    Historical Provider, MD    Family History  Problem Relation Age of Onset  .  Heart disease Mother   . Hypertension Mother   . Pancreatic cancer Mother   . Cancer Mother     pancreatic  . Pancreatic cancer Father   . Cancer Father     pancreatic  . Diabetes Brother   . Pancreatic cancer Other   . Heart disease Brother     Enlarged heart    History   Social History  . Marital Status: Married    Spouse Name: N/A    Number of Children: N/A  . Years of Education: N/A   Social History Main Topics  . Smoking status: Never Smoker   . Smokeless tobacco: None  . Alcohol Use: No  . Drug Use: No  . Sexual Activity: Not Currently   Other Topics Concern  . None   Social History Narrative   Married   Two children   Husband will be assisting with care after surgery    Review of Systems: A 12 point ROS discussed and pertinent positives are indicated in the HPI above.  All other systems are negative.  Review of Systems  Vital Signs: BP 105/60  Pulse 80  Temp(Src) 97.8 F (36.6 C) (Oral)  Resp 13  Ht 5' (1.524 m)  Wt 123 lb (55.792 kg)  BMI 24.02 kg/m2  SpO2 98%  Physical Exam  Nursing note and vitals reviewed. Constitutional: She is oriented to person, place, and time. She appears well-developed and well-nourished.  HENT:  Head: Normocephalic.  Eyes: No scleral icterus.  Cardiovascular: Normal rate.   Pulmonary/Chest: Effort normal.  Musculoskeletal:        Legs: Large scar with healed myocutaneous flap beginning in the mid thigh and extending inferiorly over the knee.  There is a surgical JP drain containing serous fluid exiting the pretibial soft tissues.    2+ pretibial edema, mild erythema.  No venous stasis dermatitis, atrophie blanche or lipodermatosclerosis.  No visible varicosities.   Neurological: She is alert and oriented to person, place, and time.  Psychiatric: She has a normal mood and affect.    Imaging: US Venous Img Lower Unilateral Right  08/28/2014   CLINICAL DATA:  80 year old female with a long history of right lower extremity swelling in the setting of multiple prior surgical interventions. Evaluate for venous insufficiency as a contributing factor.  EXAM: LOWER EXTREMITY VENOUS DUPLEX ULTRASOUND  TECHNIQUE: Gray-scale sonography with graded compression, as well as color Doppler and duplex ultrasound, were performed to evaluate the deep and superficial veins of both lower extremities. Spectral Doppler was utilized to evaluate flow at rest and with distal augmentation maneuvers. A complete superficial venous insufficiency exam was performed in the upright standing position. I personally performed the technical portion of the exam.  COMPARISON:  None.  FINDINGS: Deep Venous System:  Evaluation of the deep venous system including the common femoral, femoral, profunda femoral, popliteal and calf veins (where visible) demonstrate no evidence of deep venous thrombosis. The vessels are compressible and demonstrate normal respiratory phasicity and response to augmentation. No evidence of the deep venous reflux.  Superficial Venous System:  SFJ: Saphenofemoral junction is competent. No evidence of a venous reflux.  GSV Prox Thigh: The great saphenous vein is competent in the proximal 5. There is a proximal lateral oblique branch and slightly more distally a medial oblique branch.  GSV Mid Thigh: The vessel measures 8.7 mm in diameter and  demonstrates 1.5 seconds of reflux.  GSV Lower Thigh: The vessel measures 6 mm in diameter and demonstrates 2 seconds of  reflux.  GSV at Knee: The vessel measures 10 mm and demonstrates 2 seconds of reflux.  GSV Prox Calf: Competent.  GSV Mid Calf: Competent  GSV Distal Calf: The vessel measures 5.1 mm and demonstrates at least 4.1 seconds of reflux.  SPJ: Saphenous vein does not join the popliteal vein rather continues superiorly in the posterior aspect of the thigh. The small saphenous vein in the distal thigh is competent.  SSV Prox: Competent  SSV Mid: Competent  SSV Distal: Competent  Other: Large complex fluid collection both posterior to the knee and in the popliteal fossa an also anteromedially overlying the tibia. There is a Jackson-Pratt drain in place.  IMPRESSION: 1. Large complex fluid collection both posterior in the popliteal fossa and also anteromedially overlying the tibia. A percutaneous Jackson-Pratt drain is present within the anterior and medial aspect of the fluid collection. The collection is too large to measure sonographically as it extends beyond the limits of the field of view. Differential considerations include a combination of complex Baker cyst with postoperative seroma, infection, or lymphocele. 2. Positive for venous insufficiency of the great saphenous vein from the mid thigh to the knee and again in the distal calf. 3. The small saphenous vein is competent. 4. No evidence of deep venous thrombosis or deep venous insufficiency. Signed,  Criselda Peaches, MD  [facility]  [facility]   Electronically Signed   By: Jacqulynn Cadet M.D.   On: 08/28/2014 07:34    Labs: Lab Results  Component Value Date   WBC 4.2 06/03/2014   HCT 28.3* 06/03/2014   MCV 84.5 06/03/2014   PLT 132* 06/03/2014   NA 134* 06/03/2014   K 5.3 06/03/2014   CL 97 06/03/2014   CO2 22 06/03/2014   GLUCOSE 111* 06/03/2014   BUN 25* 06/03/2014   CREATININE 1.67*  06/03/2014   CALCIUM 9.5 06/03/2014   PROT 6.1* 10/24/2013   ALBUMIN 3.2* 10/24/2013   AST 17 10/24/2013   ALT 17 10/24/2013   ALKPHOS 62 10/24/2013   BILITOT 0.60 10/24/2013   GFRNONAA 28* 06/03/2014   GFRAA 32* 06/03/2014   INR 1.7* 07/17/2007    Assessment and Plan:  Very pleasant 80 year old female with an unfortunate history of postoperative difficulties with her right lower extremity dating back to at least 2008.  Most recently she has had recurrent wound breakdowns, serous drainage and recurrent infections of her revision right knee arthroplasty and myocutaneous flap since March of 2015.  Her evaluation today is positive for venous insufficiency involving the great saphenous vein in the thigh and calf.  While her insufficiency is mild - moderate and may not be overtly symptomatic in and of itself, I believe it is likely that the increased venous pressure and edema may be contributing to her recurrent wound breakdowns, serous drainage and recurrent infections.   She reports she is to have the drain removed at the beginning of next week.  I will give her a prescription for thigh-high surgical grade (20- 30 mmHg) compression hose and asked her to begin wearing them following removal of the drain.  If the complex fluid collection in her leg can be maintained without recurrent infection lung and a for [place] to proceed within endovenous laser ablation therapy, we should be able to treat her great saphenous vein safely.  The goal of this treatment would be to promote surgical healing of her wound.    I would also recommend referring her to a wound care  center, preferentially one with access to a hyperbaric oxygen therapy as this may also facilitate soft tissue and osseous healing.   Thank you for this interesting consult.  I greatly enjoyed meeting [employer] and look forward to participating in their care.   Signed,  Criselda Peaches, MD   I spent a total of 45 minutes face to face in clinical  consultation, greater than 50% of which was counseling/coordinating care for right lower extremity venous insufficiency complicating post surgical healing.

## 2014-09-04 ENCOUNTER — Telehealth: Payer: Self-pay | Admitting: Radiology

## 2014-09-04 NOTE — Telephone Encounter (Signed)
Patient states that RLE drainage catheter was removed on 08/31/2014.  Is still taking antibiotics at present.  Her follow up appointment with Dr Cherre Blanc is for Dec 2015.  At present has not purchased compression garment.  Patient to call our office after purchase and after determining if she is able to wear compression hose.    Will submit to insurance for possible Rx of venous insufficiency after patient update.  Sherry Blackard Riki Rusk, RN 09/04/2014 10:54 AM

## 2014-09-06 DIAGNOSIS — Z23 Encounter for immunization: Secondary | ICD-10-CM | POA: Diagnosis not present

## 2014-09-08 ENCOUNTER — Telehealth: Payer: Self-pay | Admitting: Cardiology

## 2014-09-08 ENCOUNTER — Encounter: Payer: Medicare Other | Admitting: *Deleted

## 2014-09-08 NOTE — Telephone Encounter (Signed)
LMOVM reminding pt to send remote transmission.   

## 2014-09-09 ENCOUNTER — Encounter: Payer: Self-pay | Admitting: Cardiology

## 2014-09-09 DIAGNOSIS — B999 Unspecified infectious disease: Secondary | ICD-10-CM | POA: Diagnosis not present

## 2014-09-09 DIAGNOSIS — Z4789 Encounter for other orthopedic aftercare: Secondary | ICD-10-CM | POA: Diagnosis not present

## 2014-09-09 DIAGNOSIS — Z96659 Presence of unspecified artificial knee joint: Secondary | ICD-10-CM | POA: Diagnosis not present

## 2014-09-09 DIAGNOSIS — T8450XA Infection and inflammatory reaction due to unspecified internal joint prosthesis, initial encounter: Secondary | ICD-10-CM | POA: Diagnosis not present

## 2014-09-17 DIAGNOSIS — Z853 Personal history of malignant neoplasm of breast: Secondary | ICD-10-CM | POA: Diagnosis not present

## 2014-09-17 DIAGNOSIS — I4891 Unspecified atrial fibrillation: Secondary | ICD-10-CM | POA: Diagnosis present

## 2014-09-17 DIAGNOSIS — I482 Chronic atrial fibrillation: Secondary | ICD-10-CM | POA: Diagnosis not present

## 2014-09-17 DIAGNOSIS — M069 Rheumatoid arthritis, unspecified: Secondary | ICD-10-CM | POA: Diagnosis present

## 2014-09-17 DIAGNOSIS — M79609 Pain in unspecified limb: Secondary | ICD-10-CM | POA: Diagnosis not present

## 2014-09-17 DIAGNOSIS — R5381 Other malaise: Secondary | ICD-10-CM | POA: Diagnosis not present

## 2014-09-17 DIAGNOSIS — Y834 Other reconstructive surgery as the cause of abnormal reaction of the patient, or of later complication, without mention of misadventure at the time of the procedure: Secondary | ICD-10-CM | POA: Diagnosis not present

## 2014-09-17 DIAGNOSIS — M810 Age-related osteoporosis without current pathological fracture: Secondary | ICD-10-CM | POA: Diagnosis present

## 2014-09-17 DIAGNOSIS — Z7901 Long term (current) use of anticoagulants: Secondary | ICD-10-CM | POA: Diagnosis not present

## 2014-09-17 DIAGNOSIS — Z4682 Encounter for fitting and adjustment of non-vascular catheter: Secondary | ICD-10-CM | POA: Diagnosis not present

## 2014-09-17 DIAGNOSIS — T889XXS Complication of surgical and medical care, unspecified, sequela: Secondary | ICD-10-CM | POA: Diagnosis not present

## 2014-09-17 DIAGNOSIS — M1990 Unspecified osteoarthritis, unspecified site: Secondary | ICD-10-CM | POA: Diagnosis present

## 2014-09-17 DIAGNOSIS — IMO0002 Reserved for concepts with insufficient information to code with codable children: Secondary | ICD-10-CM | POA: Diagnosis not present

## 2014-09-17 DIAGNOSIS — Z5181 Encounter for therapeutic drug level monitoring: Secondary | ICD-10-CM | POA: Diagnosis not present

## 2014-09-17 DIAGNOSIS — I421 Obstructive hypertrophic cardiomyopathy: Secondary | ICD-10-CM | POA: Diagnosis not present

## 2014-09-17 DIAGNOSIS — B957 Other staphylococcus as the cause of diseases classified elsewhere: Secondary | ICD-10-CM | POA: Diagnosis not present

## 2014-09-17 DIAGNOSIS — Z452 Encounter for adjustment and management of vascular access device: Secondary | ICD-10-CM | POA: Diagnosis not present

## 2014-09-17 DIAGNOSIS — T8450XA Infection and inflammatory reaction due to unspecified internal joint prosthesis, initial encounter: Secondary | ICD-10-CM | POA: Diagnosis not present

## 2014-09-17 DIAGNOSIS — Z950 Presence of cardiac pacemaker: Secondary | ICD-10-CM | POA: Diagnosis not present

## 2014-09-17 DIAGNOSIS — M86669 Other chronic osteomyelitis, unspecified tibia and fibula: Secondary | ICD-10-CM | POA: Diagnosis not present

## 2014-09-17 DIAGNOSIS — T8453XA Infection and inflammatory reaction due to internal right knee prosthesis, initial encounter: Secondary | ICD-10-CM | POA: Diagnosis present

## 2014-09-17 DIAGNOSIS — I10 Essential (primary) hypertension: Secondary | ICD-10-CM | POA: Diagnosis not present

## 2014-09-17 DIAGNOSIS — Z5189 Encounter for other specified aftercare: Secondary | ICD-10-CM | POA: Diagnosis not present

## 2014-09-17 DIAGNOSIS — M86661 Other chronic osteomyelitis, right tibia and fibula: Secondary | ICD-10-CM | POA: Diagnosis not present

## 2014-09-17 DIAGNOSIS — G8918 Other acute postprocedural pain: Secondary | ICD-10-CM | POA: Diagnosis not present

## 2014-09-17 DIAGNOSIS — I5032 Chronic diastolic (congestive) heart failure: Secondary | ICD-10-CM | POA: Diagnosis not present

## 2014-09-17 DIAGNOSIS — J45909 Unspecified asthma, uncomplicated: Secondary | ICD-10-CM | POA: Diagnosis present

## 2014-09-17 DIAGNOSIS — Z89611 Acquired absence of right leg above knee: Secondary | ICD-10-CM | POA: Diagnosis not present

## 2014-09-22 ENCOUNTER — Encounter: Payer: Self-pay | Admitting: *Deleted

## 2014-09-23 ENCOUNTER — Inpatient Hospital Stay (HOSPITAL_COMMUNITY)
Admission: RE | Admit: 2014-09-23 | Discharge: 2014-10-01 | DRG: 560 | Disposition: A | Discharge status: Home Health | Payer: Medicare Other | Source: Other Acute Inpatient Hospital | Attending: Physical Medicine & Rehabilitation | Admitting: Physical Medicine & Rehabilitation

## 2014-09-23 ENCOUNTER — Other Ambulatory Visit (HOSPITAL_COMMUNITY): Payer: Self-pay | Admitting: Physician Assistant

## 2014-09-23 ENCOUNTER — Inpatient Hospital Stay (HOSPITAL_COMMUNITY): Payer: Medicare Other

## 2014-09-23 DIAGNOSIS — Z89611 Acquired absence of right leg above knee: Secondary | ICD-10-CM | POA: Diagnosis not present

## 2014-09-23 DIAGNOSIS — I442 Atrioventricular block, complete: Secondary | ICD-10-CM | POA: Diagnosis not present

## 2014-09-23 DIAGNOSIS — I10 Essential (primary) hypertension: Secondary | ICD-10-CM | POA: Diagnosis not present

## 2014-09-23 DIAGNOSIS — F329 Major depressive disorder, single episode, unspecified: Secondary | ICD-10-CM | POA: Diagnosis present

## 2014-09-23 DIAGNOSIS — Z4781 Encounter for orthopedic aftercare following surgical amputation: Secondary | ICD-10-CM | POA: Diagnosis not present

## 2014-09-23 DIAGNOSIS — C50912 Malignant neoplasm of unspecified site of left female breast: Secondary | ICD-10-CM | POA: Diagnosis not present

## 2014-09-23 DIAGNOSIS — Z950 Presence of cardiac pacemaker: Secondary | ICD-10-CM

## 2014-09-23 DIAGNOSIS — K5900 Constipation, unspecified: Secondary | ICD-10-CM | POA: Diagnosis present

## 2014-09-23 DIAGNOSIS — I340 Nonrheumatic mitral (valve) insufficiency: Secondary | ICD-10-CM | POA: Diagnosis present

## 2014-09-23 DIAGNOSIS — T8450XA Infection and inflammatory reaction due to unspecified internal joint prosthesis, initial encounter: Secondary | ICD-10-CM | POA: Diagnosis not present

## 2014-09-23 DIAGNOSIS — E876 Hypokalemia: Secondary | ICD-10-CM

## 2014-09-23 DIAGNOSIS — S78111A Complete traumatic amputation at level between right hip and knee, initial encounter: Secondary | ICD-10-CM

## 2014-09-23 DIAGNOSIS — I421 Obstructive hypertrophic cardiomyopathy: Secondary | ICD-10-CM | POA: Diagnosis present

## 2014-09-23 DIAGNOSIS — I4891 Unspecified atrial fibrillation: Secondary | ICD-10-CM

## 2014-09-23 DIAGNOSIS — S78119A Complete traumatic amputation at level between unspecified hip and knee, initial encounter: Secondary | ICD-10-CM

## 2014-09-23 DIAGNOSIS — I482 Chronic atrial fibrillation, unspecified: Secondary | ICD-10-CM

## 2014-09-23 DIAGNOSIS — M069 Rheumatoid arthritis, unspecified: Secondary | ICD-10-CM | POA: Diagnosis present

## 2014-09-23 LAB — CREATININE, SERUM
Creatinine, Ser: 1.09 mg/dL (ref 0.50–1.10)
GFR calc Af Amer: 54 mL/min — ABNORMAL LOW (ref >90)
GFR calc non Af Amer: 46 mL/min — ABNORMAL LOW (ref >90)

## 2014-09-23 MED ORDER — GABAPENTIN 300 MG PO CAPS
300.0000 mg | ORAL_CAPSULE | Freq: Three times a day (TID) | ORAL | Status: DC
  Administered 2014-09-23 – 2014-10-01 (×23): 300 mg via ORAL
  Filled 2014-09-23 (×27): qty 1

## 2014-09-23 MED ORDER — SENNOSIDES-DOCUSATE SODIUM 8.6-50 MG PO TABS
1.0000 | ORAL_TABLET | Freq: Two times a day (BID) | ORAL | Status: DC
  Administered 2014-09-23 – 2014-10-01 (×14): 1 via ORAL
  Filled 2014-09-23 (×14): qty 1

## 2014-09-23 MED ORDER — APIXABAN 2.5 MG PO TABS
2.5000 mg | ORAL_TABLET | Freq: Two times a day (BID) | ORAL | Status: DC
  Administered 2014-09-23 – 2014-10-01 (×16): 2.5 mg via ORAL
  Filled 2014-09-23 (×18): qty 1

## 2014-09-23 MED ORDER — ONDANSETRON HCL 4 MG/2ML IJ SOLN: 4.0000 mg | Freq: Four times a day (QID) | INTRAMUSCULAR | Status: DC | PRN

## 2014-09-23 MED ORDER — FUROSEMIDE 20 MG PO TABS
20.0000 mg | ORAL_TABLET | Freq: Two times a day (BID) | ORAL | Status: DC
  Administered 2014-09-23 – 2014-09-26 (×6): 20 mg via ORAL
  Filled 2014-09-23 (×8): qty 1

## 2014-09-23 MED ORDER — OXYCODONE HCL 5 MG PO TABS
10.0000 mg | ORAL_TABLET | ORAL | Status: DC | PRN
  Filled 2014-09-23: qty 2

## 2014-09-23 MED ORDER — FLUTICASONE PROPIONATE 50 MCG/ACT NA SUSP
1.0000 | Freq: Every day | NASAL | Status: DC
  Administered 2014-09-24 – 2014-10-01 (×8): 1 via NASAL
  Filled 2014-09-23: qty 16

## 2014-09-23 MED ORDER — GABAPENTIN 600 MG PO TABS
300.0000 mg | ORAL_TABLET | Freq: Three times a day (TID) | ORAL | Status: DC
  Filled 2014-09-23 (×3): qty 0.5

## 2014-09-23 MED ORDER — CALCIUM CARBONATE ANTACID 500 MG PO CHEW
1.0000 | CHEWABLE_TABLET | Freq: Three times a day (TID) | ORAL | Status: DC
  Administered 2014-09-23 – 2014-10-01 (×25): 200 mg via ORAL
  Filled 2014-09-23 (×27): qty 1

## 2014-09-23 MED ORDER — CYCLOBENZAPRINE HCL 5 MG PO TABS
5.0000 mg | ORAL_TABLET | Freq: Every day | ORAL | Status: DC
  Administered 2014-09-23 – 2014-09-30 (×8): 5 mg via ORAL
  Filled 2014-09-23 (×9): qty 1

## 2014-09-23 MED ORDER — MIRTAZAPINE 15 MG PO TABS
15.0000 mg | ORAL_TABLET | Freq: Every day | ORAL | Status: DC
  Administered 2014-09-23 – 2014-09-30 (×8): 15 mg via ORAL
  Filled 2014-09-23 (×9): qty 1

## 2014-09-23 MED ORDER — SODIUM CHLORIDE 0.9 % IJ SOLN: 10.0000 mL | Freq: Two times a day (BID) | INTRAMUSCULAR | Status: DC

## 2014-09-23 MED ORDER — ACETAMINOPHEN 325 MG PO TABS
325.0000 mg | ORAL_TABLET | ORAL | Status: DC | PRN
  Administered 2014-09-23 – 2014-10-01 (×15): 650 mg via ORAL
  Filled 2014-09-23 (×16): qty 2

## 2014-09-23 MED ORDER — ADULT MULTIVITAMIN W/MINERALS CH
1.0000 | ORAL_TABLET | Freq: Every day | ORAL | Status: DC
  Administered 2014-09-24 – 2014-10-01 (×8): 1 via ORAL
  Filled 2014-09-23 (×9): qty 1

## 2014-09-23 MED ORDER — VANCOMYCIN HCL IN DEXTROSE 750-5 MG/150ML-% IV SOLN
750.0000 mg | INTRAVENOUS | Status: AC
  Administered 2014-09-24 – 2014-10-01 (×8): 750 mg via INTRAVENOUS
  Filled 2014-09-23 (×9): qty 150

## 2014-09-23 MED ORDER — ONDANSETRON HCL 4 MG PO TABS: 4.0000 mg | ORAL_TABLET | Freq: Four times a day (QID) | ORAL | Status: DC | PRN

## 2014-09-23 MED ORDER — METOPROLOL SUCCINATE ER 100 MG PO TB24
100.0000 mg | ORAL_TABLET | Freq: Every day | ORAL | Status: DC
  Administered 2014-09-24 – 2014-09-27 (×3): 100 mg via ORAL
  Filled 2014-09-23 (×5): qty 1

## 2014-09-23 MED ORDER — SODIUM CHLORIDE 0.9 % IJ SOLN
10.0000 mL | INTRAMUSCULAR | Status: DC | PRN
  Administered 2014-09-23 – 2014-10-01 (×9): 10 mL

## 2014-09-23 MED ORDER — CYCLOSPORINE 0.05 % OP EMUL
1.0000 [drp] | Freq: Two times a day (BID) | OPHTHALMIC | Status: DC
  Administered 2014-09-23 – 2014-10-01 (×16): 1 [drp] via OPHTHALMIC
  Filled 2014-09-23 (×18): qty 1

## 2014-09-23 NOTE — Progress Notes (Signed)
ANTIBIOTIC CONSULT NOTE - INITIAL  Pharmacy Consult for vancomycin Indication: surgical prophylaxis / right knee wound infection  Allergies  Allergen Reactions  . Ceftaroline Rash  . Codeine Nausea And Vomiting  . Morphine Nausea And Vomiting  . Sulfa Antibiotics Nausea And Vomiting and Nausea Only    Patient Measurements: Height: 5' (152.4 cm) Weight: 111 lb 11.2 oz (50.667 kg) IBW/kg (Calculated) : 45.5 Adjusted Body Weight:   Vital Signs: Temp: 97.6 F (36.4 C) (10/06 1438) Temp Source: Oral (10/06 1438) BP: 109/57 mmHg (10/06 1438) Pulse Rate: 87 (10/06 1438) Intake/Output from previous day:   Intake/Output from this shift:    Labs: No results found for this basename: WBC, HGB, PLT, LABCREA, CREATININE,  in the last 72 hours Estimated Creatinine Clearance: 19 ml/min (by C-G formula based on Cr of 1.67). No results found for this basename: VANCOTROUGH, VANCOPEAK, VANCORANDOM, GENTTROUGH, GENTPEAK, GENTRANDOM, TOBRATROUGH, TOBRAPEAK, TOBRARND, AMIKACINPEAK, AMIKACINTROU, AMIKACIN,  in the last 72 hours   Microbiology: No results found for this or any previous visit (from the past 720 hour(s)).  Medical History: Past Medical History  Diagnosis Date  . Mitral regurgitation   . Hypertrophic obstructive cardiomyopathy     Required septal myotomy  . Urinary incontinence   . Cystitis   . Hemorrhoids   . Bronchitis   . Asthma   . Anemia   . Heart block     Complete heart block post surgical requiring pacer  . Hypokalemia     Postoperative, improved  . Hyponatremia     Mild postoperative, improved  . Blood transfusion   . Constipation   . Arthritis pain   . Cancer     lt breast  . Hypertension     Benign Essential Hypertension    . Current use of long term anticoagulation   . Chronic atrial fibrillation   . Arthritis   . Pacemaker     Medications:  Scheduled:  . apixaban  2.5 mg Oral BID  . calcium carbonate  1 tablet Oral TID  . cyclobenzaprine  5  mg Oral QHS  . cycloSPORINE  1 drop Both Eyes BID  . fluticasone  1 spray Each Nare Daily  . furosemide  20 mg Oral BID  . gabapentin  300 mg Oral TID  . [START ON 09/24/2014] metoprolol succinate  100 mg Oral Daily  . mirtazapine  15 mg Oral QHS  . [START ON 09/24/2014] multivitamin with minerals  1 tablet Oral Daily  . senna-docusate  1 tablet Oral BID   Infusions:   Assessment: 80 yo female with right knee wound infection s/p AKA at [facility] has been on vancomycin.  Today is day 6 of vancomycin therapy.  Patient had been on vancomycin 750mg  iv q24h at [facility] and last dose was at [facility] on 09/23/14.  SCr on 09/22/14 was 0.8 (CrCl ~ 31.7 if adjust SCr to 1 due to age).  Per [facility] note, will continue vancomycin for a total of 14 days (end date 10/01/14) unless her culture from mid-thigh incision turn positive, then will need to treat x 6 weeks.  Coag negative staph in culture still pending at [facility].  WBC on 10/05 was 6.  Goal of Therapy:  Vancomycin trough level 15-20 mcg/ml  Plan:  - continue vancomycin 750mg  iv q24h, next dose at 0830 tomorrow - check vancmoycin trough on Thursday to verify dose - follow cx and duration of antibiotic  Vanice Rappa, Tsz-Yin 09/23/2014,3:54 PM

## 2014-09-23 NOTE — Discharge Instructions (Addendum)
Information on my medicine - ELIQUIS (apixaban)  This medication education was reviewed with me or my healthcare representative as part of my discharge preparation.  The pharmacist that spoke with me during my hospital stay was:  Steffanie Dunn, PharmD  Why was Eliquis prescribed for you? Eliquis was prescribed for you to reduce the risk of a blood clot forming that can cause a stroke if you have a medical condition called atrial fibrillation (a type of irregular heartbeat).  What do You need to know about Eliquis ? Take your Eliquis 2.5 mg TWICE DAILY - one tablet in the morning and one tablet in the evening with or without food. If you have difficulty swallowing the tablet whole please discuss with your pharmacist how to take the medication safely.  Take Eliquis exactly as prescribed by your doctor and DO NOT stop taking Eliquis without talking to the doctor who prescribed the medication.  Stopping may increase your risk of developing a stroke.  Refill your prescription before you run out.  After discharge, you should have regular check-up appointments with your healthcare provider that is prescribing your Eliquis.  In the future your dose may need to be changed if your kidney function or weight changes by a significant amount or as you get older.  What do you do if you miss a dose? If you miss a dose, take it as soon as you remember on the same day and resume taking twice daily.  Do not take more than one dose of ELIQUIS at the same time to make up a missed dose.  Important Safety Information A possible side effect of Eliquis is bleeding. You should call your healthcare provider right away if you experience any of the following:   Bleeding from an injury or your nose that does not stop.   Unusual colored urine (red or dark brown) or unusual colored stools (red or black).   Unusual bruising for unknown reasons.   A serious fall or if you hit your head (even if there is no  bleeding).  Some medicines may interact with Eliquis and might increase your risk of bleeding or clotting while on Eliquis. To help avoid this, consult your healthcare provider or pharmacist prior to using any new prescription or non-prescription medications, including herbals, vitamins, non-steroidal anti-inflammatory drugs (NSAIDs) and supplements.  This website has more information on Eliquis (apixaban): http://[email or web address].com/eliquis/home Inpatient Rehab Discharge Instructions  Sharon KLOEPPEL Discharge date and time: No discharge date for patient encounter.   Activities/Precautions/ Functional Status: Activity: activity as tolerated Diet: regular diet Wound Care: keep wound clean and dry Functional status:  ___ No restrictions     ___ Walk up steps independently ___ 24/7 supervision/assistance   ___ Walk up steps with assistance ___ Intermittent supervision/assistance  ___ Bathe/dress independently ___ Walk with walker     ___ Bathe/dress with assistance ___ Walk Independently    ___ Shower independently ___ Walk with assistance    ___ Shower with assistance ___ No alcohol     ___ Return to work/school ________   COMMUNITY REFERRALS UPON DISCHARGE:    Home Health:   PT     OT    RN                     Agency: [facility] Phone: [phone]   Medical Equipment/Items Ordered: wheelchair and cushion  Agency/Supplier: [facility] @ [phone]   GENERAL COMMUNITY RESOURCES FOR PATIENT/FAMILY:  Support Groups: Amputee Support group (handout provided)      Special Instructions: Followup surgery Dr. [facility] [phone]  Followup infectious disease [facility] Dr. Nonie Hoyer [phone]   My questions have been answered and I understand these instructions. I will adhere to these goals and the provided educational materials after my discharge from the hospital.  Patient/Caregiver  Signature _______________________________ Date __________  Clinician Signature _______________________________________ Date __________  Please bring this form and your medication list with you to all your follow-up doctor's appointments.

## 2014-09-23 NOTE — H&P (Signed)
Physical Medicine and Rehabilitation Admission H&P  No chief complaint on file.  :  HPI: 80 year-old female the medical with history of hypertrophic obstructive cardiomyopathy/PPM, complete heart block with mitral regurgitation, rheumatoid arthritis, right TKA 1985 with multiple infections and revisions. Admitted to [facility] for elective right AKA 09/17/2014 per Dr.Ewards [phone]. Postoperative pain management. Infectious disease consulted and planned for 2 week course of antibiotic therapy with vancomycin (Dr Tillman Sers [phone]). Plan prosthetic followup at [facility] in [place], [place]. Min mod assist for functional mobility. Recommendations made for ongoing therapies and patient was admitted for comprehensive rehabilitation program  Review of Systems  Cardiovascular: Positive for palpitations.  Gastrointestinal: Positive for constipation.  Genitourinary: Positive for urgency.  Musculoskeletal: Positive for joint pain and myalgias.  Psychiatric/Behavioral: Positive for depression.  All other systems reviewed and are negative.     Past Medical History    Diagnosis  Date    .  Mitral regurgitation     .  Hypertrophic obstructive cardiomyopathy       Required septal myotomy    .  Urinary incontinence     .  Cystitis     .  Hemorrhoids     .  Bronchitis     .  Asthma     .  Anemia     .  Heart block       Complete heart block post surgical requiring pacer    .  Hypokalemia       Postoperative, improved    .  Hyponatremia       Mild postoperative, improved    .  Blood transfusion     .  Constipation     .  Arthritis pain     .  Cancer       lt breast    .  Hypertension       Benign Essential Hypertension    .  Current use of long term anticoagulation     .  Chronic atrial fibrillation     .  Arthritis     .  Pacemaker        Past Surgical History    Procedure  Laterality  Date    .  Anterior cervical diskectomy and fusion        C-7    .  Septal  myotomy      .  Pacemaker placement   2006/11-27-2013      MDT dual chamber pacemaker implanted 2006 with gen change by Dr Lovena Le 11-27-2013    .  Replacement total knee bilateral      .  Femur fracture surgery        Right    .  Wrist fracture surgery        Right    .  Shoulder surgery        Right    .  Breast biopsy        Left, negative    .  Colonoscopy      .  Abdominal hysterectomy      .  Cardiomyopathy      .  Breast lumpectomy   2012      left breast    .  Joint replacement         Family History    Problem  Relation  Age of Onset    .  Heart disease  Mother     .  Hypertension  Mother     .  Pancreatic cancer  Mother     .  Cancer  Mother       pancreatic    .  Pancreatic cancer  Father     .  Cancer  Father       pancreatic    .  Diabetes  Brother     .  Pancreatic cancer  Other     .  Heart disease  Brother       Enlarged heart     Social History: reports that she has never smoked. She does not have any smokeless tobacco history on file. She reports that she does not drink alcohol or use illicit drugs.  Allergies:    Allergies    Allergen  Reactions    .  Codeine  Nausea And Vomiting    .  Morphine  Nausea And Vomiting    .  Sulfa Antibiotics  Nausea Only and Nausea And Vomiting    .  Ceftaroline  Rash     Medications prior to admission. Fosamax weekly  Eliquis 2.5 mg twice a day, Flexeril as needed, eyedrops, Lasix 20 mg twice a day, Neurontin 300 mg 3 times a day, Toprol-XL 100 mg daily, Remeron 15 mg each bedtime, multivitamin, Ultram as needed  Home: Married/retired. Husband can assist.   Functional History:  independent in all areas of ADLs prior to admission. Patient used a walker prior to admission. One level home with one-step entry.  Functional Status:  Mobility: Min assist sit to stand. Min assist stand to sit. Min assist stand pivot transfer. All movements are slow and deliberate.      ADL: Min mod assist ADLs   Cognition:  alert and  oriented x3   Physical Exam:128/70 P 80 RR 18 T 99  Constitutional: She is oriented to person, place, and time. She appears well-developed.  HENT: oral mucosa pink and moist Head: Normocephalic.  Eyes: EOM are normal.  Neck: Normal range of motion. Neck supple. No thyromegaly present.  Cardiovascular:  Cardiac rate controlled. No murmurs rubs or gallops Respiratory: Effort normal and breath sounds normal. No respiratory distress.  GI: Soft. Bowel sounds are normal. She exhibits no distension.  Neurological: She is alert and oriented to person, place, and time. RUE: deltoid 5/5, bicep 5/5, tricep 5/5, wrist ext 5/5, hand intrinsics 5/5 LUE: deltoid 5/5, bicep 5/5, triceps 5/5, wrist ext 5/5, hand instrincs 5/5 LLE: 4/5 proximal to distal. RLE: HF 3/5 Skin:  Staples intact with small amount of serosanguineous drainage. Mild edema of stump. Psychiatric: She has a normal mood and affect.   No results found for this or any previous visit (from the past 48 hour(s)).  No results found.  Medical Problem List and Plan:  1. Functional deficits secondary to right AKA after multiple infections related to total knee replacement 09/17/2014  2. DVT Prophylaxis/Anticoagulation: Eliquis as directed. Monitor for any bleeding episodes  3. Pain Management: Neurontin 300 mg 3 times a day, oxycodone as needed. Monitor the increased mobility  4. Mood/depression: Remeron 15 mg daily. Provide emotional support  5. Neuropsych: This patient is capable of making decisions on her own behalf.  6. Skin/Wound Care: Routine skin care after AKA  7. Fluids/Electrolytes/Nutrition: Followup chemistries strict I and O.  8. Hypertrophic obstructive cardiomyopathy/PPM. Chest pain or shortness of breath continue Eliquis  9. ID. Continue vancomycin x2 weeks postoperatively per infectious disease for wound coverage  10. Hypertension. Lasix 20 mg twice a day, Toprol-XL 100 mg daily.  Post Admission  Physician Evaluation:   1. Functional deficits secondary to right AKA secondary to infected right TKA. 2. Patient is admitted to receive collaborative, interdisciplinary care between the physiatrist, rehab nursing staff, and therapy team. 3. Patient's level of medical complexity and substantial therapy needs in context of that medical necessity cannot be provided at a lesser intensity of care such as a SNF. 4. Patient has experienced substantial functional loss from his/her baseline which was documented above under the "Functional History" and "Functional Status" headings. Judging by the patient's diagnosis, physical exam, and functional history, the patient has potential for functional progress which will result in measurable gains while on inpatient rehab. These gains will be of substantial and practical use upon discharge in facilitating mobility and self-care at the household level. 5. Physiatrist will provide 24 hour management of medical needs as well as oversight of the therapy plan/treatment and provide guidance as appropriate regarding the interaction of the two. 6. 24 hour rehab nursing will assist with bladder management, bowel management, safety, skin/wound care, disease management, medication administration, pain management and patient education and help integrate therapy concepts, techniques,education, etc. 7. PT will assess and treat for/with: Lower extremity strength, range of motion, stamina, balance, functional mobility, safety, adaptive techniques and equipment, pain mgt, pre-prosthetic education, wound care. Goals are: mod I at wc level. 8. OT will assess and treat for/with: ADL's, functional mobility, safety, upper extremity strength, adaptive techniques and equipment, stump protection, wound mgt. Goals are: mod I. Therapy may proceed with showering this patient if right AK stump wrapped. 9. SLP will assess and treat for/with: n/a. Goals are: n/a. 10. Case Management and Social Worker will assess and treat  for psychological issues and discharge planning. 11. Team conference will be held weekly to assess progress toward goals and to determine barriers to discharge. 12. Patient will receive at least 3 hours of therapy per day at least 5 days per week. 13. ELOS: 7-10 days  14. Prognosis: excellent  Meredith Staggers, MD, [facility] 09/23/2014

## 2014-09-23 NOTE — PMR Pre-admission (Signed)
[facility] PMR Admission Coordinator Pre-Admission Assessment  Patient: [name] is an 80 y.o., female MRN: [id number] DOB: Nov 01, 1931 Height: 5' (152.4 cm) Weight: 59.875 kg (132 lb)  Insurance Information HMO:     PPO:      PCP:      IPA:      80/20:      OTHER:  PRIMARY: Medicare A & B      Policy#: [id number]      Subscriber: self Pre-Cert#: verified in [employer]: retired [employer] Date: A & B: 05-19-98     Deduct: $1260      Out of Pocket Max: none      Life Max: unlimited CIR: 100%      SNF: 100% days 1-20; 80% days 21-100 (100 day visit limit) Outpatient: 80%     Co-Pay: 20% Home Health: 100%      Co-Pay: none, no visit limits DME: 80%     Co-Pay: 20% Providers: pt's preference  SECONDARY: Tricare for Life      Policy#: [id number]        Emergency Contact Information Contact Information   Name Relation Home Work [place] Spouse [phone]  [phone]   Siddique,Darrell Son   [phone]      Current Medical History  Patient Admitting Diagnosis: Previous chronic infection of R TKA endoprosthesis, now s/p R AKA History of Present Illness: This pleasant 80 year old female was admitted to [facility] on 09-17-14 with a chronic infection of right TKA endoprosthesis (original R knee arthroplasty in 2008). Pt has had an unfortunate course following this R TKA in 9024, which was complicated by infection and required reimplantation. Pt had a right tibia periprosthetic fracture s/p proximal tibial replacement on 01-22-14. This was initially treated with ORIF and gastro flap. Again, this was infected and since then had been admitted several times for wound infections, she was on prolonged antibiotics. Patient then had multiple I&D on R knee on 03-19-14, 05-04-14 and 08-13-14. She is now s/p R AKA on 09-17-14. Cardiology was consulted due to her cardiac history of hypertrophic obstructive cardiomyopathy and underwent myomectomy in 2002. She has a  history of complete heart block and had medtronic PPM placed in 2006 (pt remains pacemaker dependent). She also has atrial fibrillation and has been on Apixaban since 06/2013. She has been stable from a cardiac standpoint and participating well with therapies. PICC line was placed for further antibiotics.  Pt has been making great progress with PT/OT and inpatient rehab was recommended by medical team at [facility].  Patient's medical record from [facility] has been reviewed by the rehabilitation admission coordinator and physician.  Past Medical History  Past Medical History  Diagnosis Date  . Mitral regurgitation   . Hypertrophic obstructive cardiomyopathy     Required septal myotomy  . Urinary incontinence   . Cystitis   . Hemorrhoids   . Bronchitis   . Asthma   . Anemia   . Heart block     Complete heart block post surgical requiring pacer  . Hypokalemia     Postoperative, improved  . Hyponatremia     Mild postoperative, improved  . Blood transfusion   . Constipation   . Arthritis pain   . Cancer     lt breast  . Hypertension     Benign Essential Hypertension    . Current use of long term anticoagulation   . Chronic atrial  fibrillation   . Arthritis   . Pacemaker     Family History  family history includes Cancer in her father and mother; Diabetes in her brother; Heart disease in her brother and mother; Hypertension in her mother; Pancreatic cancer in her father, mother, and other.  Prior Rehab/Hospitalizations: pt was hospitalized in 2-15 following her R LE fracture/ORIF and went to SNF after that hospitalization.   Current Medications See MAR  Patients Current Diet:  Regular diet  Precautions / Restrictions Precautions Precautions: Fall (recent R AKA, with shrinker on) Restrictions Weight Bearing Restrictions:  (NWB R LE due to R AKA)   Prior Activity Level Limited Community (1-2x/wk): Pt was independent at home prior to amputation, was driving  and used a crutch occasionally. She enjoyed crochet and running errands/shopping.  Home Assistive Devices / Equipment Home Assistive Devices/Equipment: Crutches   Prior Functional Level Current Functional Level  Bed Mobility  Independent  Other (stand by assistance w/ increased time and cues)   Transfers  Independent  Min assist (using rolling walker and extra time)   Mobility - Walk/Wheelchair  Independent  Min assist (Min assist 8' forward, 4' backward)   Upper Body Dressing  Independent  Other (set up assist at seated level)   Lower Body Dressing  Independent  Min assist (in long sitting)   Grooming  Independent   (set up assistance)   Eating/Drinking  Independent  Independent   Toilet Transfer  Independent  Min assist   Bladder Continence   [facility]  [facility]   Bowel Management  WFL  Last BM 10-5   Stair Climbing   WFL  Other (not assessed, anticipate needs)   Communication  [facility]  [facility]   Memory  [facility]  WFL   Cooking/Meal Prep  Independent      Housework  Independent    Money Management  Independent    Driving   WFL     Special needs/care consideration BiPAP/CPAP no  CPM no  Continuous Drip IV no  Dialysis no          Life Vest no  Oxygen no  Special Bed no  Trach Size no  Wound Vac (area) no        Skin - current R AKA incision in shrinker, L UE PICC Line                               Bowel mgmt: last BM on 09-22-14  Bladder mgmt:using commode with assist Diabetic mgmt no  Previous Home Environment Living Arrangements: Spouse/significant other  Lives With: Spouse Available Help at Discharge: Family;Available 24 hours/day Type of Home: House Home Layout: One level Home Access: Stairs to enter Entrance Stairs-Rails: None Entrance Stairs-Number of Steps: 1 step at main floor, 13 steps up from basement  Discharge Living Setting Plans for Discharge Living Setting: Patient's home Type of Home at Discharge: House Discharge Home Layout:  One level Discharge Home Access: Stairs to enter Entrance Stairs-Rails: None Entrance Stairs-Number of Steps: 1 Does the patient have any problems obtaining your medications?: No  Social/Family/Support Systems Patient Roles: Spouse Contact Information: husband [name] is primary contact Anticipated Caregiver: husband/family Anticipated Caregiver's Contact Information: see above Ability/Limitations of Caregiver: no limitations Caregiver Availability: 24/7 Discharge Plan Discussed with Primary Caregiver: Yes Is Caregiver In Agreement with Plan?: Yes Does Caregiver/Family have Issues with Lodging/Transportation while Pt is in Rehab?: No  Goals/Additional Needs Patient/Family Goal for Rehab: Supervision and  Mod Ind with PT/OT, NA for SLP Expected length of stay: 7-10 days Cultural Considerations: none Dietary Needs: regular Equipment Needs: to be determined Pt/Family Agrees to Admission and willing to participate: Yes Program Orientation Provided & Reviewed with Pt/Caregiver Including Roles  & Responsibilities: Yes  Patient Condition: I met with patient at [facility] on 09-23-14 to give further information about our acute inpatient rehab program at [facility]. We have reviewed pt's case and been in communication with [facility] since this referral on 09-19-14. This 80 year old female, who was previously independent and active/enjoys shopping, is doing well following her right AKA surgery on 09-17-14 and is motivated to regain her independence and participate in acute rehab. This patient is currently requiring minimal assistance with transfers and gait using a walker and is having phantom limb sensations in her R LE. She is needing minimal help with ADL tasks and is performing dressing in a long sitting position. She will benefit greatly from the multi-disciplinary team of skilled PT, OT and rehab nursing to help maximize her functional independence in bed mobility, transfers, gait  and self care skills following this new LE amputation. Patient will benefit from skilled nursing to help address pt/family teaching in regards to PICC line teaching/medication needs in preparation for the transition to home. In addition, pt will benefit from rehab physician intervention to monitor her cardiac status and medical needs in light of her previous infections. Discussed case with Dr. Naaman Plummer and rehab PA who feel that pt is a good inpatient rehab candidate. We received medical clearance from [facility]. Pt and her family are motivated to come to inpatient rehab and will benefit from the intensive services of skilled therapy under rehab physician guidance. Pt will be admitted today on 09-23-14.  Preadmission Screen Completed By:  Nanetta Batty, PT, 09/23/2014 9:48 AM ______________________________________________________________________   Discussed status with Dr. Naaman Plummer on 09-23-14 at 15 and received telephone approval for admission today.  Admission Coordinator:  Sherlyn Hay Sherral Dirocco,PT, time 1020/Date 09-23-14   Assessment/Plan: Diagnosis: Right AKA after chronically infected Right TKA 1. Does the need for close, 24 hr/day  Medical supervision in concert with the patient's rehab needs make it unreasonable for this patient to be served in a less intensive setting? Yes 2. Co-Morbidities requiring supervision/potential complications: Mitral regurg, anemia,  3. Due to bladder management, bowel management, safety, skin/wound care, disease management, medication administration, pain management and patient education, does the patient require 24 hr/day rehab nursing? Yes 4. Does the patient require coordinated care of a physician, rehab nurse, PT (1-2 hrs/day, 5 days/week) and OT (1-2 hrs/day, 5 days/week) to address physical and functional deficits in the context of the above medical diagnosis(es)? Yes Addressing deficits in the following areas: balance, endurance, locomotion, strength, transferring,  bowel/bladder control, bathing, dressing, feeding, grooming and toileting 5. Can the patient actively participate in an intensive therapy program of at least 3 hrs of therapy 5 days a week? Yes 6. The potential for patient to make measurable gains while on inpatient rehab is excellent 7. Anticipated functional outcomes upon discharge from inpatients are: modified independent PT, modified independent OT, n/a SLP 8. Estimated rehab length of stay to reach the above functional goals is: 7-10 days 9. Does the patient have adequate social supports to accommodate these discharge functional goals? Yes 10. Anticipated D/C setting: Home 11. Anticipated post D/C treatments: HH therapy and Outpatient therapy 12. Overall Rehab/Functional Prognosis: excellent    RECOMMENDATIONS: This patient's condition is appropriate for continued rehabilitative  care in the following setting: CIR Patient has agreed to participate in recommended program. Yes Note that insurance prior authorization may be required for reimbursement for recommended care.  Comment: Admit to CIR today.  Meredith Staggers, MD, [facility] 09/23/2014   Nanetta Batty, PT 09/23/2014

## 2014-09-24 ENCOUNTER — Inpatient Hospital Stay (HOSPITAL_COMMUNITY): Payer: Medicare Other | Admitting: Occupational Therapy

## 2014-09-24 ENCOUNTER — Inpatient Hospital Stay (HOSPITAL_COMMUNITY): Payer: Medicare Other

## 2014-09-24 DIAGNOSIS — I10 Essential (primary) hypertension: Secondary | ICD-10-CM

## 2014-09-24 DIAGNOSIS — E876 Hypokalemia: Secondary | ICD-10-CM

## 2014-09-24 LAB — COMPREHENSIVE METABOLIC PANEL
ALT: 15 U/L (ref 0–35)
AST: 26 U/L (ref 0–37)
Albumin: 2.3 g/dL — ABNORMAL LOW (ref 3.5–5.2)
Alkaline Phosphatase: 102 U/L (ref 39–117)
Anion gap: 8 (ref 5–15)
BUN: 11 mg/dL (ref 6–23)
CO2: 33 mEq/L — ABNORMAL HIGH (ref 19–32)
Calcium: 8.3 mg/dL — ABNORMAL LOW (ref 8.4–10.5)
Chloride: 103 mEq/L (ref 96–112)
Creatinine, Ser: 0.98 mg/dL (ref 0.50–1.10)
GFR calc Af Amer: 61 mL/min — ABNORMAL LOW (ref >90)
GFR calc non Af Amer: 53 mL/min — ABNORMAL LOW (ref >90)
Glucose, Bld: 98 mg/dL (ref 70–99)
Potassium: 3.1 mEq/L — ABNORMAL LOW (ref 3.7–5.3)
Sodium: 144 mEq/L (ref 137–147)
Total Bilirubin: 0.4 mg/dL (ref 0.3–1.2)
Total Protein: 5.1 g/dL — ABNORMAL LOW (ref 6.0–8.3)

## 2014-09-24 LAB — CBC WITH DIFFERENTIAL/PLATELET
Basophils Absolute: 0 10*3/uL (ref 0.0–0.1)
Basophils Relative: 1 % (ref 0–1)
Eosinophils Absolute: 0.1 10*3/uL (ref 0.0–0.7)
Eosinophils Relative: 2 % (ref 0–5)
HCT: 28.5 % — ABNORMAL LOW (ref 36.0–46.0)
Hemoglobin: 9.3 g/dL — ABNORMAL LOW (ref 12.0–15.0)
Lymphocytes Relative: 15 % (ref 12–46)
Lymphs Abs: 0.9 10*3/uL (ref 0.7–4.0)
MCH: 29.9 pg (ref 26.0–34.0)
MCHC: 32.6 g/dL (ref 30.0–36.0)
MCV: 91.6 fL (ref 78.0–100.0)
Monocytes Absolute: 0.8 10*3/uL (ref 0.1–1.0)
Monocytes Relative: 14 % — ABNORMAL HIGH (ref 3–12)
Neutro Abs: 4.2 10*3/uL (ref 1.7–7.7)
Neutrophils Relative %: 68 % (ref 43–77)
Platelets: 314 10*3/uL (ref 150–400)
RBC: 3.11 MIL/uL — ABNORMAL LOW (ref 3.87–5.11)
RDW: 18.1 % — ABNORMAL HIGH (ref 11.5–15.5)
WBC: 6.1 10*3/uL (ref 4.0–10.5)

## 2014-09-24 MED ORDER — POTASSIUM CHLORIDE CRYS ER 20 MEQ PO TBCR
20.0000 meq | EXTENDED_RELEASE_TABLET | Freq: Two times a day (BID) | ORAL | Status: DC
  Administered 2014-09-24 – 2014-10-01 (×15): 20 meq via ORAL
  Filled 2014-09-24 (×17): qty 1

## 2014-09-24 NOTE — Evaluation (Signed)
Physical Therapy Assessment and Plan  Patient Details  Name: [name] MRN: [id number] Date of Birth: [birth year]  PT Diagnosis: Difficulty walking, Edema, Impaired cognition and Muscle weakness Rehab Potential: Good ELOS: 7 days   Today's Date: 09/24/2014 PT Individual Time: 0803-0903 PT Individual Time Calculation (min): 60 min    Problem List:  Patient Active Problem List   Diagnosis Date Noted  . Unilateral AKA 09/23/2014  . Preop cardiovascular exam 01/16/2014  . Atrioventricular block, complete 07/03/2013  . Atrial fibrillation 07/03/2013  . Breast cancer, left breast 04/24/2013  . Other symptoms involving cardiovascular system 12/22/2010  . PACEMAKER, PERMANENT 06/21/2010  . MITRAL REGURGITATION 06/17/2010  . HYPERTENSION 06/17/2010  . HOCM (hypertrophic obstructive cardiomyopathy) 06/17/2010  . HEART BLOCK 06/17/2010  . HEMORRHOIDS 06/17/2010  . BRONCHITIS 06/17/2010  . ASTHMA 06/17/2010  . CYSTITIS 06/17/2010  . CHEST PAIN 06/17/2010  . URINARY INCONTINENCE 06/17/2010    Past Medical History:  Past Medical History  Diagnosis Date  . Mitral regurgitation   . Hypertrophic obstructive cardiomyopathy     Required septal myotomy  . Urinary incontinence   . Cystitis   . Hemorrhoids   . Bronchitis   . Asthma   . Anemia   . Heart block     Complete heart block post surgical requiring pacer  . Hypokalemia     Postoperative, improved  . Hyponatremia     Mild postoperative, improved  . Blood transfusion   . Constipation   . Arthritis pain   . Cancer     lt breast  . Hypertension     Benign Essential Hypertension    . Current use of long term anticoagulation   . Chronic atrial fibrillation   . Arthritis   . Pacemaker    Past Surgical History:  Past Surgical History  Procedure Laterality Date  . Anterior cervical diskectomy and fusion      C-7  . Septal myotomy    . Pacemaker placement  2006/11-27-2013    MDT dual chamber pacemaker implanted 2006  with gen change by Dr Lovena Le 11-27-2013  . Replacement total knee bilateral    . Femur fracture surgery      Right  . Wrist fracture surgery      Right  . Shoulder surgery      Right  . Breast biopsy      Left, negative  . Colonoscopy    . Abdominal hysterectomy    . Cardiomyopathy    . Breast lumpectomy  2012    left breast  . Joint replacement      Assessment & Plan Clinical Impression: 80 year-old female the medical with history of hypertrophic obstructive cardiomyopathy/PPM, complete heart block with mitral regurgitation, rheumatoid arthritis, right TKA 1985 with multiple infections and revisions. Admitted to [facility] for elective right AKA 09/17/2014 per Dr.Ewards [phone]. Postoperative pain management. Infectious disease consulted and planned for 2 week course of antibiotic therapy with vancomycin.  Patient transferred to CIR on 09/23/2014 .   Patient currently requires mod with mobility secondary to muscle weakness and decreased cardiorespiratoy endurance.  Prior to hospitalization, patient was modified independent  with mobility and lived with Spouse (7 people- 2 relatives) in a House home.  Home access is 1 step at main floor (ramp here),  13 steps up from basementStairs to enter.  Patient will benefit from skilled PT intervention to maximize safe functional mobility, minimize fall risk and decrease caregiver burden for planned discharge home with 24 hour  supervision.  Anticipate patient will benefit from follow up [place] at discharge.  PT - End of Session Activity Tolerance: Tolerates < 10 min activity, no significant change in vital signs Endurance Deficit: Yes Endurance Deficit Description: SOBOE; after gait x 12' PT Assessment Rehab Potential: Good PT Patient demonstrates impairments in the following area(s): Balance;Endurance;Motor;Safety PT Transfers Functional Problem(s): Bed Mobility;Bed to Chair;Car;Furniture PT Locomotion Functional Problem(s):  Ambulation;Wheelchair Mobility;Stairs PT Plan PT Intensity: Minimum of 1-2 x/day ,45 to 90 minutes PT Frequency: 5 out of 7 days PT Duration Estimated Length of Stay: 7 days PT Treatment/Interventions: Ambulation/gait training;Balance/vestibular training;Discharge planning;DME/adaptive equipment instruction;Functional mobility training;Patient/family education;Pain management;Neuromuscular re-education;Psychosocial support;Splinting/orthotics;Therapeutic Exercise;Therapeutic Activities;Stair training;UE/LE Strength taining/ROM;UE/LE Coordination activities;Wheelchair propulsion/positioning PT Transfers Anticipated Outcome(s): modified independent PT Locomotion Anticipated Outcome(s): supervision w/c x 150' and gait x 50'; min assist up/down 1 step for home entry PT Recommendation Follow Up Recommendations: Home health PT Patient destination: Home  Skilled Therapeutic Intervention- eval completed.  Pt frustrated because she could not remember the town where she lives, and she was a poor historian describing her long hx of problems with her RLE, and recent vision problems.  Pt is very motivated and wants to be independent; however her judgement is poor for her present abilities.  She asked several questions during eval implying that she would use RW and go to BR by herself.  Falls risk reitturated,and quick release belt applied at end of session.  W/c propulsion using bil UEs x 50'.  Pt fitted with smaller w/c and cushion; amputee pad does not acommodate her residual limb well. She is not currently using any support for residual limb at this time;  will continue to assess and provide appropriate support PRN.  PT Evaluation Precautions/Restrictions Precautions Precautions: Fall Restrictions Weight Bearing Restrictions: Yes RLE Weight Bearing: Non weight bearing General   Vital SignsTherapy Vitals Pulse Rate: 86 BP: 90/58 mmHg, sitting- no c/o dizziness Pain Pain Assessment Pain Score:  3 Pain Type: Phantom pain Pain Location: Knee Pain Orientation: Right Pain Descriptors / Indicators: Aching Pain Intervention(s): Medication (See eMAR) Home Living/Prior Functioning Home Living Available Help at Discharge: Family;Available 24 hours/day Type of Home: House Home Access: Stairs to enter [employer] of Steps: 1 step at main floor (ramp here),  steps up from basement Entrance Stairs-Rails: None Home Layout: One level  Lives With: Spouse (7 people- 2 relatives) Prior Function Level of Independence: Independent with basic ADLs;Requires assistive device for independence (used walker and crutches, w/c occasionally)  Able to Take Stairs?: Yes Driving: Yes (would like to drive when d/c) Vocation: Retired Leisure: Hobbies-yes (Comment) Comments: sometimes used 1-2 crutches Vision/Perception- pt describes visual blurriness recently, since RLE infection/surgeries problems started     Cognition Overall Cognitive Status: Within Functional Limits for tasks assessed Arousal/Alertness: Awake/alert Orientation Level: Oriented to place;Oriented to time;Oriented to situation;Oriented to person (unable to state her address town/city) Safety/Judgment: Impaired (pt asked several times if she should use the RW by herself to go the BR, etc) Sensation Sensation Light Touch: Appears Intact (RLE) Coordination Heel Shin Test: NT Motor  Motor Motor - Skilled Clinical Observations: jerky movements/tremors of bil UEs and LLE since condition declined  Mobility Bed Mobility Bed Mobility: Rolling Left;Supine to Sit Rolling Left: 6: Modified independent (Device/Increase time) Supine to Sit: 5: Supervision Transfers Transfers: Yes Stand Pivot Transfers: 4: Min assist;From elevated surface Locomotion  Ambulation Ambulation: Yes Ambulation/Gait Assistance: 4: Min assist Ambulation Distance (Feet): 12 Feet Assistive device: Rolling walker Ambulation/Gait Assistance Details:  Verbal cues  for safe use of DME/AE;Verbal cues for technique Gait Gait: Yes Gait Pattern: Impaired Gait Pattern: Trunk flexed;Decreased hip/knee flexion - left Stairs / Additional Locomotion Stairs: No Wheelchair Mobility Wheelchair Mobility: Yes Wheelchair Assistance: 5: Careers information officer: Both upper extremities Wheelchair Parts Management: Needs assistance Distance: 150  Trunk/Postural Assessment  Cervical Assessment Cervical Assessment: Within Functional Limits Thoracic Assessment Thoracic Assessment: Exceptions to [facility] Thoracic PROM Overall Thoracic PROM Comments: kyphotic; pt stated she has lost 3" in height Lumbar Assessment Lumbar Assessment: Exceptions to [facility] Lumbar AROM Overall Lumbar AROM Comments: sits in posterior pelvic tilt; limited motion in all planes Postural Control Postural Control: Deficits on evaluation Protective Responses: limited and inadequate  Balance Balance Balance Assessed: Yes Static Sitting Balance Static Sitting - Level of Assistance: 5: Stand by assistance Static Standing Balance Static Standing - Level of Assistance: 2: Max assist Extremity Assessment      RLE Assessment- edematous; not viewed at eval; bandage in place RLE Assessment: Exceptions to [facility] RLE Strength RLE Overall Strength Comments: grossly at least 2/5 all hip motions; no resistance given LLE Assessment LLE Assessment: Exceptions to [facility] LLE Strength LLE Overall Strength Comments: grossly in sitting: 4/5 hip; knee ext and ankle DG 5/5  FIM:  FIM - Bed/Chair Transfer Bed/Chair Transfer: 5: Supine > Sit: Supervision (verbal cues/safety issues);4: Chair or W/C > Bed: Min A (steadying Pt. > 75%);3: Bed > Chair or W/C: Mod A (lift or lower assist) FIM - Locomotion: Wheelchair Distance: 150 Locomotion: Wheelchair: 5: Travels 150 ft or more: maneuvers on rugs and over door sills with supervision, cueing or coaxing FIM - Locomotion: Ambulation Locomotion:  Ambulation Assistive Devices: Administrator Ambulation/Gait Assistance: 4: Min assist Locomotion: Ambulation: 1: Travels less than 50 ft with minimal assistance (Pt.>75%) FIM - Locomotion: Stairs Locomotion: Stairs: 0: Activity did not occur   Refer to Care Plan for Long Term Goals  Recommendations for other services: None  Discharge Criteria: Patient will be discharged from PT if patient refuses treatment 3 consecutive times without medical reason, if treatment goals not met, if there is a change in medical status, if patient makes no progress towards goals or if patient is discharged from hospital.  The above assessment, treatment plan, treatment alternatives and goals were discussed and mutually agreed upon: by patient  Dynasti Kerman 09/24/2014, 11:14 AM

## 2014-09-24 NOTE — Progress Notes (Signed)
[facility] Admission Coordinator Signed Physical Medicine and Rehabilitation PMR Pre-admission Service date: 09/23/2014 9:48 AM  Related encounter: Documentation from 09/22/2014 in [place] PMR Admission Coordinator Pre-Admission Assessment   Patient: [name] is an 80 y.o., female MRN: [id number] DOB: October 05, 1931 Height: 5' (152.4 cm) Weight: 59.875 kg (132 lb)   Insurance Information HMO:     PPO:      PCP:      IPA:      80/20:      OTHER:   PRIMARY: Medicare A & B      Policy#: [id number]      Subscriber: self Pre-Cert#: verified in [employer]: retired Runner, broadcasting/film/video. Date: A & B: 05-19-98     Deduct: $1260      Out of Pocket Max: none      Life Max: unlimited CIR: 100%      SNF: 100% days 1-20; 80% days 21-100 (100 day visit limit) Outpatient: 80%     Co-Pay: 20% Home Health: 100%      Co-Pay: none, no visit limits DME: 80%     Co-Pay: 20% Providers: pt's preference  SECONDARY: Tricare for Life      Policy#: [id number]         Emergency Contact Information Contact Information     Name  Relation  Home  Work  [place]  Spouse  [phone]    [phone]     Canepa,Darrell  Son      [phone]         Current Medical History  Patient Admitting Diagnosis: Previous chronic infection of R TKA endoprosthesis, now s/p R AKA History of Present Illness: This pleasant 80 year old female was admitted to [facility] on 09-17-14 with a chronic infection of right TKA endoprosthesis (original R knee arthroplasty in 2008). Pt has had an unfortunate course following this R TKA in 0947, which was complicated by infection and required reimplantation. Pt had a right tibia periprosthetic fracture s/p proximal tibial replacement on 01-22-14. This was initially treated with ORIF and gastro flap. Again, this was infected and since then had been admitted several times for wound infections, she was  on prolonged antibiotics. Patient then had multiple I&D on R knee on 03-19-14, 05-04-14 and 08-13-14. She is now s/p R AKA on 09-17-14. Cardiology was consulted due to her cardiac history of hypertrophic obstructive cardiomyopathy and underwent myomectomy in 2002. She has a history of complete heart block and had medtronic PPM placed in 2006 (pt remains pacemaker dependent). She also has atrial fibrillation and has been on Apixaban since 06/2013. She has been stable from a cardiac standpoint and participating well with therapies. PICC line was placed for further antibiotics.   Pt has been making great progress with PT/OT and inpatient rehab was recommended by medical team at [facility].   Patient's medical record from [facility] has been reviewed by the rehabilitation admission coordinator and physician.   Past Medical History  Past Medical History   Diagnosis  Date   .  Mitral regurgitation     .  Hypertrophic obstructive cardiomyopathy         Required septal myotomy   .  Urinary incontinence     .  Cystitis     .  Hemorrhoids     .  Bronchitis     .  Asthma     .  Anemia     .  Heart block         Complete heart block post surgical requiring pacer   .  Hypokalemia         Postoperative, improved   .  Hyponatremia         Mild postoperative, improved   .  Blood transfusion     .  Constipation     .  Arthritis pain     .  Cancer         lt breast   .  Hypertension         Benign Essential Hypertension     .  Current use of long term anticoagulation     .  Chronic atrial fibrillation     .  Arthritis     .  Pacemaker        Family History  family history includes Cancer in her father and mother; Diabetes in her brother; Heart disease in her brother and mother; Hypertension in her mother; Pancreatic cancer in her father, mother, and other.   Prior Rehab/Hospitalizations: pt was hospitalized in 2-15 following her R LE fracture/ORIF and went to SNF after that  hospitalization.     Current Medications See MAR   Patients Current Diet:  Regular diet   Precautions / Restrictions Precautions Precautions: Fall (recent R AKA, with shrinker on) Restrictions Weight Bearing Restrictions:  (NWB R LE due to R AKA)    Prior Activity Level Limited Community (1-2x/wk): Pt was independent at home prior to amputation, was driving and used a crutch occasionally. She enjoyed crochet and running errands/shopping.   Home Assistive Devices / Equipment Home Assistive Devices/Equipment: Crutches      Prior Functional Level  Current Functional Level   Bed Mobility   Independent   Other (stand by assistance w/ increased time and cues)    Transfers   Independent   Min assist (using rolling walker and extra time)    Mobility - Walk/Wheelchair   Independent   Min assist (Min assist 8' forward, 4' backward)    Upper Body Dressing   Independent   Other (set up assist at seated level)    Lower Body Dressing   Independent   Min assist (in long sitting)    Grooming   Independent   (set up assistance)    Eating/Drinking   Independent   Independent    Toilet Transfer   Independent   Min assist    Bladder Continence     [facility]   [facility]    Bowel Management   WFL   Last BM 10-5    Stair Climbing   WFL   Other (not assessed, anticipate needs)    Communication   [facility]   [facility]    Memory   [facility]   WFL    Cooking/Meal Prep   Independent        Housework   Independent      Money Management   Independent      Driving   WFL         Special needs/care consideration BiPAP/CPAP no   CPM no   Continuous Drip IV no   Dialysis no           Life Vest no   Oxygen no   Special Bed no   Trach Size no   Wound Vac (area) no         Skin - current R AKA incision in shrinker,  L UE PICC Line                                Bowel mgmt: last BM on 09-22-14        Bladder mgmt:using commode with assist Diabetic mgmt no   Previous Home  Environment Living Arrangements: Spouse/significant other  Lives With: Spouse Available Help at Discharge: Family;Available 24 hours/day Type of Home: House Home Layout: One level Home Access: Stairs to enter Entrance Stairs-Rails: None Entrance Stairs-Number of Steps: 1 step at main floor, 13 steps up from basement   Discharge Living Setting Plans for Discharge Living Setting: Patient's home Type of Home at Discharge: House Discharge Home Layout: One level Discharge Home Access: Stairs to enter Entrance Stairs-Rails: None Entrance Stairs-Number of Steps: 1 Does the patient have any problems obtaining your medications?: No   Social/Family/Support Systems Patient Roles: Spouse Contact Information: husband [name] is primary contact Anticipated Caregiver: husband/family Anticipated Caregiver's Contact Information: see above Ability/Limitations of Caregiver: no limitations Caregiver Availability: 24/7 Discharge Plan Discussed with Primary Caregiver: Yes Is Caregiver In Agreement with Plan?: Yes Does Caregiver/Family have Issues with Lodging/Transportation while Pt is in Rehab?: No   Goals/Additional Needs Patient/Family Goal for Rehab: Supervision and Mod Ind with PT/OT, NA for SLP Expected length of stay: 7-10 days Cultural Considerations: none Dietary Needs: regular Equipment Needs: to be determined Pt/Family Agrees to Admission and willing to participate: Yes Program Orientation Provided & Reviewed with Pt/Caregiver Including Roles  & Responsibilities: Yes   Patient Condition: I met with patient at [facility] on 09-23-14 to give further information about our acute inpatient rehab program at [facility]. We have reviewed pt's case and been in communication with [facility] since this referral on 09-19-14. This 80 year old female, who was previously independent and active/enjoys shopping, is doing well following her right AKA surgery on 09-17-14 and is motivated to  regain her independence and participate in acute rehab. This patient is currently requiring minimal assistance with transfers and gait using a walker and is having phantom limb sensations in her R LE. She is needing minimal help with ADL tasks and is performing dressing in a long sitting position. She will benefit greatly from the multi-disciplinary team of skilled PT, OT and rehab nursing to help maximize her functional independence in bed mobility, transfers, gait and self care skills following this new LE amputation. Patient will benefit from skilled nursing to help address pt/family teaching in regards to PICC line teaching/medication needs in preparation for the transition to home. In addition, pt will benefit from rehab physician intervention to monitor her cardiac status and medical needs in light of her previous infections. Discussed case with Dr. Naaman Plummer and rehab PA who feel that pt is a good inpatient rehab candidate. We received medical clearance from [facility]. Pt and her family are motivated to come to inpatient rehab and will benefit from the intensive services of skilled therapy under rehab physician guidance. Pt will be admitted today on 09-23-14.   Preadmission Screen Completed By:  Nanetta Batty, PT, 09/23/2014 9:48 AM ______________________________________________________________________    Discussed status with Dr. Naaman Plummer on 09-23-14 at 56 and received telephone approval for admission today.   Admission Coordinator:  Sherlyn Hay Lachae Hohler,PT, time 1020/Date 09-23-14    Assessment/Plan: Diagnosis: Right AKA after chronically infected Right TKA Does the need for close, 24 hr/day  Medical supervision in concert with the patient's rehab needs make it  unreasonable for this patient to be served in a less intensive setting? Yes Co-Morbidities requiring supervision/potential complications: Mitral regurg, anemia,   Due to bladder management, bowel management, safety, skin/wound care, disease  management, medication administration, pain management and patient education, does the patient require 24 hr/day rehab nursing? Yes Does the patient require coordinated care of a physician, rehab nurse, PT (1-2 hrs/day, 5 days/week) and OT (1-2 hrs/day, 5 days/week) to address physical and functional deficits in the context of the above medical diagnosis(es)? Yes Addressing deficits in the following areas: balance, endurance, locomotion, strength, transferring, bowel/bladder control, bathing, dressing, feeding, grooming and toileting Can the patient actively participate in an intensive therapy program of at least 3 hrs of therapy 5 days a week? Yes The potential for patient to make measurable gains while on inpatient rehab is excellent Anticipated functional outcomes upon discharge from inpatients are: modified independent PT, modified independent OT, n/a SLP Estimated rehab length of stay to reach the above functional goals is: 7-10 days Does the patient have adequate social supports to accommodate these discharge functional goals? Yes Anticipated D/C setting: Home Anticipated post D/C treatments: HH therapy and Outpatient therapy Overall Rehab/Functional Prognosis: excellent       RECOMMENDATIONS: This patient's condition is appropriate for continued rehabilitative care in the following setting: CIR Patient has agreed to participate in recommended program. Yes Note that insurance prior authorization may be required for reimbursement for recommended care.   Comment: Admit to CIR today.   Meredith Staggers, MD, [place] [facility] 09/23/2014     Nanetta Batty, PT 09/23/2014  Revision History...     Date/Time User Action   09/23/2014 11:08 AM Meredith Staggers, MD Sign   09/23/2014 10:22 AM Quentin Mulling Avelina Mcclurkin Share  View Details Report

## 2014-09-24 NOTE — Evaluation (Signed)
Evaluation note and session notes reviewed and all accurately reflect assessment & plan and treatment sessions.

## 2014-09-24 NOTE — Progress Notes (Signed)
Patient information reviewed and entered into eRehab system by Aadan Chenier, RN, CRRN, PPS Coordinator.  Information including medical coding and functional independence measure will be reviewed and updated through discharge.     Per nursing patient was given "Data Collection Information Summary for Patients in Inpatient Rehabilitation Facilities with attached "Privacy Act Statement-Health Care Records" upon admission.  

## 2014-09-24 NOTE — Evaluation (Signed)
Occupational Therapy Assessment, Plan, and Session Notes  Patient Details  Name: [name] MRN: [id number] Date of Birth: [birth year]  OT Diagnosis: acute pain and muscle weakness (generalized) Rehab Potential: Rehab Potential: Excellent ELOS: 7-10 days   Today's Date: 09/24/2014     Problem List:  Patient Active Problem List   Diagnosis Date Noted  . Unilateral AKA 09/23/2014  . Preop cardiovascular exam 01/16/2014  . Atrioventricular block, complete 07/03/2013  . Atrial fibrillation 07/03/2013  . Breast cancer, left breast 04/24/2013  . Other symptoms involving cardiovascular system 12/22/2010  . PACEMAKER, PERMANENT 06/21/2010  . MITRAL REGURGITATION 06/17/2010  . HYPERTENSION 06/17/2010  . HOCM (hypertrophic obstructive cardiomyopathy) 06/17/2010  . HEART BLOCK 06/17/2010  . HEMORRHOIDS 06/17/2010  . BRONCHITIS 06/17/2010  . ASTHMA 06/17/2010  . CYSTITIS 06/17/2010  . CHEST PAIN 06/17/2010  . URINARY INCONTINENCE 06/17/2010    Past Medical History:  Past Medical History  Diagnosis Date  . Mitral regurgitation   . Hypertrophic obstructive cardiomyopathy     Required septal myotomy  . Urinary incontinence   . Cystitis   . Hemorrhoids   . Bronchitis   . Asthma   . Anemia   . Heart block     Complete heart block post surgical requiring pacer  . Hypokalemia     Postoperative, improved  . Hyponatremia     Mild postoperative, improved  . Blood transfusion   . Constipation   . Arthritis pain   . Cancer     lt breast  . Hypertension     Benign Essential Hypertension    . Current use of long term anticoagulation   . Chronic atrial fibrillation   . Arthritis   . Pacemaker    Past Surgical History:  Past Surgical History  Procedure Laterality Date  . Anterior cervical diskectomy and fusion      C-7  . Septal myotomy    . Pacemaker placement  2006/11-27-2013    MDT dual chamber pacemaker implanted 2006 with gen change by Dr Lovena Le 11-27-2013  .  Replacement total knee bilateral    . Femur fracture surgery      Right  . Wrist fracture surgery      Right  . Shoulder surgery      Right  . Breast biopsy      Left, negative  . Colonoscopy    . Abdominal hysterectomy    . Cardiomyopathy    . Breast lumpectomy  2012    left breast  . Joint replacement      Assessment & Plan Clinical Impression: Patient is a 80 year-old female with medical with history of hypertrophic obstructive cardiomyopathy/PPM, complete heart block with mitral regurgitation, rheumatoid arthritis, right TKA 1985 with multiple infections and revisions. Admitted to [facility] for elective right AKA 09/17/2014 per Dr.Ewards [phone]. Postoperative pain management. Infectious disease consulted and planned for 2 week course of antibiotic therapy with vancomycin (Dr Tillman Sers [phone]). Plan prosthetic followup at [facility] in [place], [place]. Min mod assist for functional mobility. Recommendations made for ongoing therapies and patient was admitted for comprehensive rehabilitation program Review of Systems. Patient transferred to CIR on 09/23/2014 .    Patient currently requires min with basic self-care skills secondary to muscle weakness, decreased visual acuity, decreased safety awareness and decreased sitting balance, decreased standing balance, decreased postural control and decreased balance strategies.  Prior to hospitalization, patient could complete ADLs and IADLs with modified independent .  Patient will benefit from skilled intervention  to increase independence with basic self-care skills prior to discharge home with care partner.  Anticipate patient will require intermittent supervision and no further OT follow recommended.  OT - End of Session Activity Tolerance: Tolerates 30+ min activity with multiple rests Endurance Deficit: Yes Endurance Deficit Description: SOBOE; after gait x 12' OT Assessment Rehab Potential: Excellent OT  Patient demonstrates impairments in the following area(s): Balance;Cognition;Endurance;Motor;Safety;Skin Integrity;Vision OT Basic ADL's Functional Problem(s): Grooming;Bathing;Dressing;Toileting OT Advanced ADL's Functional Problem(s): Simple Meal Preparation;Light Housekeeping OT Transfers Functional Problem(s): Toilet;Tub/Shower OT Plan OT Intensity: Minimum of 1-2 x/day, 45 to 90 minutes OT Frequency: 5 out of 7 days OT Duration/Estimated Length of Stay: 7-10 days OT Treatment/Interventions: Balance/vestibular training;Cognitive remediation/compensation;Discharge planning;Community reintegration;DME/adaptive equipment instruction;Functional mobility training;Pain management;Psychosocial support;Patient/family education;Self Care/advanced ADL retraining;Therapeutic Exercise;UE/LE Strength taining/ROM;Visual/perceptual remediation/compensation;Wheelchair propulsion/positioning OT Basic Self-Care Anticipated Outcome(s): Mod I OT Toileting Anticipated Outcome(s): Mod I OT Bathroom Transfers Anticipated Outcome(s): Mod I OT Recommendation Recommendations for Other Services:  (None at this time) Patient destination: Home Follow Up Recommendations: None Equipment Recommended: To be determined   Skilled Therapeutic Intervention Session 1 OT Individual Time: 1030-1130 OT Individual Time Calculation (min): 60 min  Initial 1 on 1 OT eval completed. Pt seated in w/c when arriving, reporting 4/10 pain in RLE. Pt performed 1 sit<>stand t/f, demonstrating knowledge of locking brakes. Pt self-propelled w/c to sink and performed UB/LB bathing seated in w/c, w/ 1 sit<>stand t/f. Pt self-propelled w/c to side of bed and performed UB/LB dressing, w/ 1 sit<>stand t/f using RW. Pt educated on safety w/ w/c use and RW. Pt self-propelled w/c to bathroom and performed stand pivot t/f to toilet, voided, and t/f back to w/c. Pt washed hands, brushed hair, and brushed teeth seated in w/c at sink. Pt seated in w/c w/  all needs nearby when leaving.  Session 2 OT Individual Time: 2:00-3:00 OT Individual Time Calculation (min): 60 min  Pt seated in w/c when arriving, reporting no pain. Pt self-propelled w/c to apartment and practiced t/f into simulated walk-in shower onto TTB. Discussed discharge planning, home set-up, and possible DME needed at home. Pt educated on w/c safety and set-up for t/f. Pt self-propelled w/c to rehab gym and practiced set-up for and t/f to therapy mat. Pt needed v/c to removed footrest and move arm rest before beginning t/f. Pt participated in activity at high-low table to work on dynamic standing balance, alternating UE use (for working on Probation officer), standing tolerance, and endurance. Pt stood for 10 min 2x. Pt self-propelled w/c to room and seated in w/c w/ all needs nearby when leaving.  OT Evaluation Precautions/Restrictions  Precautions Precautions: Fall Restrictions Weight Bearing Restrictions: Yes RLE Weight Bearing: Non weight bearing  General Chart Reviewed: Yes Family/Caregiver Present: No  Pain Pain Assessment Pain Score: 0-No pain  Home Living/Prior Functioning Home Living Available Help at Discharge: Family;Available 24 hours/day Type of Home: House Home Access: Stairs to enter [employer] of Steps: 1 step at main floor (ramp here),  steps up from basement Entrance Stairs-Rails: None Home Layout: One level  Lives With: Spouse (7 people- 2 relatives) IADL History Homemaking Responsibilities: Yes Meal Prep Responsibility: Secondary Laundry Responsibility: No Cleaning Responsibility: Secondary Bill Paying/Finance Responsibility: No Shopping Responsibility: No Child Care Responsibility: Secondary Current License: Yes Mode of Transportation: Car Education: HS graduate Occupation: Retired 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, [place], fishing, reading Prior Function Level of  Independence: Independent with basic ADLs;Requires assistive device for independence (used walker  and crutches, w/c occasionally)  Able to Take Stairs?: Yes Driving: Yes (would like to drive when d/c) Vocation: Retired Leisure: Hobbies-yes (Comment) Comments: sometimes used 1-2 crutches  ADL See FIM  Vision/Perception  Vision- History Baseline Vision/History: Wears glasses;Macular Degeneration Wears Glasses: At all times Patient Visual Report: No change from baseline Vision- Assessment Additional Comments: Pt reports macular degeneration, will work on compensatory strategies for safety w/ functional activities   Cognition Overall Cognitive Status: Within Functional Limits for tasks assessed Arousal/Alertness: Awake/alert Orientation Level: Oriented to place;Oriented to time;Oriented to situation;Oriented to person (unable to state her address town/city) Safety/Judgment: Impaired (Issues noted w/ safety awareness w/ w/c and walker)  Sensation Sensation Light Touch: Appears Intact  Motor  Motor Motor: Within Functional Limits Motor - Skilled Clinical Observations: Tremors noted in BUE  Mobility  Transfers Transfers: Sit to Stand;Stand to Sit Sit to Stand: 4: Min assist Sit to Stand Details: Verbal cues for precautions/safety;Verbal cues for safe use of DME/AE Stand to Sit: 4: Min assist Stand to Sit Details (indicate cue type and reason): Verbal cues for precautions/safety;Verbal cues for safe use of DME/AE   Trunk/Postural Assessment  Cervical Assessment Cervical Assessment: Within Functional Limits Thoracic Assessment Thoracic Assessment: Exceptions to [facility] Thoracic PROM Overall Thoracic PROM Comments: kyphotic; pt stated she has lost 3" in height Lumbar Assessment Lumbar Assessment: Exceptions to [facility] Lumbar AROM Overall Lumbar AROM Comments: sits in posterior pelvic tilt; limited motion in all planes Postural Control Postural Control: Deficits on  evaluation Protective Responses: limited and inadequate   Balance Balance Balance Assessed: Yes Static Sitting Balance Static Sitting - Level of Assistance: 5: Stand by assistance Static Standing Balance Static Standing - Level of Assistance: 2: Max assist  Extremity/Trunk Assessment RUE Assessment RUE Assessment: Within Functional Limits LUE Assessment LUE Assessment: Within Functional Limits  FIM:  FIM - Grooming Grooming Steps: Wash, rinse, dry face;Wash, rinse, dry hands;Oral care, brush teeth, clean dentures;Brush, comb hair Grooming: 4: Steadying assist  or patient completes 3 of 4 or 4 of 5 steps FIM - Bathing Bathing Steps Patient Completed: Chest;Right Arm;Left Arm;Abdomen;Left upper leg;Right upper leg;Buttocks;Front perineal area;Left lower leg (including foot) Bathing: 4: Steadying assist FIM - Upper Body Dressing/Undressing Upper body dressing/undressing steps patient completed: Thread/unthread right bra strap;Thread/unthread left bra strap;Hook/unhook bra;Thread/unthread right sleeve of pullover shirt/dresss;Put head through opening of pull over shirt/dress;Pull shirt over trunk;Thread/unthread left sleeve of pullover shirt/dress Upper body dressing/undressing: 4: Steadying assist FIM - Lower Body Dressing/Undressing Lower body dressing/undressing steps patient completed: Thread/unthread right underwear leg;Thread/unthread left underwear leg;Pull underwear up/down;Thread/unthread right pants leg;Thread/unthread left pants leg;Pull pants up/down;Don/Doff left sock;Don/Doff left shoe;Fasten/unfasten left shoe Lower body dressing/undressing: 4: Steadying Assist FIM - Toileting Toileting steps completed by patient: Adjust clothing prior to toileting;Performs perineal hygiene;Adjust clothing after toileting Toileting Assistive Devices: Grab bar or rail for support Toileting: 4: Steadying assist FIM - IT sales professional Transfer: 5: Supine > Sit: Supervision  (verbal cues/safety issues);4: Chair or W/C > Bed: Min A (steadying Pt. > 75%);3: Bed > Chair or W/C: Mod A (lift or lower assist) FIM - Radio producer Devices: Grab bars Toilet Transfers: 4-To toilet/BSC: Min A (steadying Pt. > 75%);4-From toilet/BSC: Min A (steadying Pt. > 75%) FIM - Tub/Shower Transfers Tub/shower Transfers: 0-Activity did not occur or was simulated   Refer to Care Plan for Long Term Goals  Recommendations for other services: None at this time  Discharge Criteria: Patient will be discharged from OT if patient refuses treatment 3  consecutive times without medical reason, if treatment goals not met, if there is a change in medical status, if patient makes no progress towards goals or if patient is discharged from hospital.  The above assessment, treatment plan, treatment alternatives and goals were discussed and mutually agreed upon: by patient  Taylen Wendland Raynell 09/24/2014, 12:28 PM

## 2014-09-24 NOTE — Progress Notes (Signed)
[place] PHYSICAL MEDICINE & REHABILITATION     PROGRESS NOTE    Subjective/Complaints: Slept well last night. Doesn't feel all that great this morning but can't tell me why. Denies cough, sob, stump, pain, nausea or vomiting   Objective: Vital Signs: Blood pressure 112/65, pulse 69, temperature 97.9 F (36.6 C), temperature source Oral, resp. rate 16, height 5' (1.524 m), weight 50.667 kg (111 lb 11.2 oz), SpO2 97.00%. Dg Chest Port 1 View  09/23/2014   CLINICAL DATA:  Left PICC line placement.  EXAM: PORTABLE CHEST - 1 VIEW  COMPARISON:  06/03/2014  FINDINGS: The heart is upper limits of normal and stable. There is tortuosity and calcification of the thoracic aorta. The pacer wires are stable. The left PICC line tip appears to be in the right atrium. It is difficult to see for certain because of superimposition of the pacer wires but I think the tip is approximately 6 cm below the carina which would place at approximately 1 cm into the right atrium.  The lungs are clear.  No pneumothorax.  IMPRESSION: The right PICC line tip is near the cavoatrial junction and likely just into the right atrium.   Electronically Signed   By: Kalman Jewels M.D.   On: 09/23/2014 17:43    Recent Labs  09/24/14 0500  WBC 6.1  HGB 9.3*  HCT 28.5*  PLT 314    Recent Labs  09/23/14 1627 09/24/14 0500  NA  --  144  K  --  3.1*  CL  --  103  GLUCOSE  --  98  BUN  --  11  CREATININE 1.09 0.98  CALCIUM  --  8.3*   CBG (last 3)  No results found for this basename: GLUCAP,  in the last 72 hours  Wt Readings from Last 3 Encounters:  09/23/14 50.667 kg (111 lb 11.2 oz)  09/22/14 59.875 kg (132 lb)  08/27/14 55.792 kg (123 lb)    Physical Exam:  Constitutional: She is oriented to person, place, and time.   HENT: oral mucosa pink and moist  Head: Normocephalic.  Eyes: EOM are normal.  Neck: Normal range of motion. Neck supple. No thyromegaly present.  Cardiovascular:  Cardiac rate  controlled. No murmurs rubs or gallops  Respiratory: Effort normal and breath sounds normal. No respiratory distress.  GI: Soft. Bowel sounds are normal. She exhibits no distension.  Neurological: She is alert and oriented to person, place, and time. RUE: deltoid 5/5, bicep 5/5, tricep 5/5, wrist ext 5/5, hand intrinsics 5/5  LUE: deltoid 5/5, bicep 5/5, triceps 5/5, wrist ext 5/5, hand instrincs 5/5  LLE: 4/5 proximal to distal. RLE: HF 3/5  Skin:  Staples intact with small amount of serosanguineous drainage. Mild edema of stump. Psychiatric: just waking up. Fairly alert once woken.   Assessment/Plan: 1. Functional deficits secondary to right AK (infected right TKA) which require 3+ hours per day of interdisciplinary therapy in a comprehensive inpatient rehab setting. Physiatrist is providing close team supervision and 24 hour management of active medical problems listed below. Physiatrist and rehab team continue to assess barriers to discharge/monitor patient progress toward functional and medical goals. FIM:                   Comprehension Comprehension Mode: Auditory Comprehension: 7-Follows complex conversation/direction: With no assist  Expression Expression Mode: Verbal Expression: 7-Expresses complex ideas: With no assist  Social Interaction Social Interaction: 7-Interacts appropriately with others - No medications needed.  Medical Problem List and Plan:  1. Functional deficits secondary to right AKA after multiple infections related to total knee replacement 09/17/2014  2. DVT Prophylaxis/Anticoagulation: Eliquis as directed.    3. Pain Management: Neurontin 300 mg 3 times a day, oxycodone as needed.   4. Mood/depression: Remeron 15 mg daily. Provide emotional support  5. Neuropsych: This patient is capable of making decisions on her own behalf.  6. Skin/Wound Care: Routine skin care after AKA   -stump sock  -too early for shrinker 7.  Fluids/Electrolytes/Nutrition: replace potassium 8. Hypertrophic obstructive cardiomyopathy/PPM. Chest pain or shortness of breath continue Eliquis  9. ID. Continue vancomycin x2 weeks postoperatively per infectious disease for wound coverage   -afebrile. Wound looks great 10. Hypertension. Lasix 20 mg twice a day, Toprol-XL 100 mg daily.  LOS (Days) 1 A FACE TO FACE EVALUATION WAS PERFORMED  Jonatha Gagen T 09/24/2014 9:06 AM

## 2014-09-25 ENCOUNTER — Encounter (HOSPITAL_COMMUNITY): Payer: Medicare Other | Admitting: Occupational Therapy

## 2014-09-25 ENCOUNTER — Inpatient Hospital Stay (HOSPITAL_COMMUNITY): Payer: Medicare Other

## 2014-09-25 LAB — VANCOMYCIN, TROUGH: Vancomycin Tr: 16.2 ug/mL (ref 10.0–20.0)

## 2014-09-25 NOTE — Progress Notes (Signed)
Note reviewed and accurately reflects treatment session.   

## 2014-09-25 NOTE — Progress Notes (Signed)
Occupational Therapy Session Note  Patient Details  Name: [name] MRN: [id number] Date of Birth: 1931-08-29  Today's Date: 09/25/2014 OT Individual Time: 0930-1030 OT Individual Time Calculation (min): 60 min    Short Term Goals: Week 1:  OT Short Term Goal 1 (Week 1): STG=LTG  Skilled Therapeutic Interventions/Progress Updates:  Pt seated in w/c when arriving, reporting no pain. Pt doffed clothing while seated in w/c, therapist covered IV and AKA dressing, and pt self-propelled w/c to bathroom. Pt t/f to TTB, requiring v/c for w/c set-up and removing foot rest and arm rest. Pt completed UB/LB bathing, w/ 1 sit<>stand t/f, t/f to w/c w/ squat pivot, and self-propelled w/c to room. Pt performed UB/LB dressing, w/ 2 sit<>stand t/f. Pt educated on UE HEP, unable to complete wheelchair pushups safely and given 3 theraband exercises. Pt unable to recall 2/3 exercises, plan to write down exercises for her to remember during next session. Pt requiring multiple v/c for w/c and walker safety this session, also repeating information told to therapist last session and earlier in this session, showing questionable STM deficits. Pt seated in w/c w/ all needs nearby when leaving.  Therapy Documentation Precautions:  Precautions Precautions: Fall Restrictions Weight Bearing Restrictions: Yes RLE Weight Bearing: Non weight bearing  See FIM for current functional status  Therapy/Group: Individual Therapy  Caitlen Worth Raynell 09/25/2014, 11:50 AM

## 2014-09-25 NOTE — Progress Notes (Signed)
ANTIBIOTIC CONSULT NOTE - FOLLOW UP  Pharmacy Consult for vancomycin Indication: knee wound infection  Allergies  Allergen Reactions  . Ceftaroline Rash  . Codeine Nausea And Vomiting  . Morphine Nausea And Vomiting  . Sulfa Antibiotics Nausea And Vomiting and Nausea Only    Patient Measurements: Height: 5' (152.4 cm) Weight: 111 lb 11.2 oz (50.667 kg) IBW/kg (Calculated) : 45.5 Adjusted Body Weight:   Vital Signs: Temp: 98.7 F (37.1 C) (10/08 0500) Temp Source: Oral (10/08 0500) BP: 123/95 mmHg (10/08 0736) Pulse Rate: 88 (10/08 0736) Intake/Output from previous day: 10/07 0701 - 10/08 0700 In: 720 [P.O.:720] Out: -  Intake/Output from this shift: Total I/O In: 250 [P.O.:240; I.V.:10] Out: -   Labs:  Recent Labs  09/23/14 1627 09/24/14 0500  WBC  --  6.1  HGB  --  9.3*  PLT  --  314  CREATININE 1.09 0.98   Estimated Creatinine Clearance: 32.3 ml/min (by C-G formula based on Cr of 0.98). No results found for this basename: VANCOTROUGH, VANCOPEAK, VANCORANDOM, GENTTROUGH, GENTPEAK, GENTRANDOM, TOBRATROUGH, TOBRAPEAK, TOBRARND, AMIKACINPEAK, AMIKACINTROU, AMIKACIN,  in the last 72 hours   Microbiology: No results found for this or any previous visit (from the past 720 hour(s)).  Anti-infectives   Start     Dose/Rate Route Frequency Ordered Stop   09/24/14 0830  vancomycin (VANCOCIN) IVPB 750 mg/150 ml premix     750 mg 150 mL/hr over 60 Minutes Intravenous Every 24 hours 09/23/14 1600        Assessment: 80 years old female with right knee wound infection s/p AKA is currently on therapeutic vancomycin.  Vancomycin trough is 16.2. Urinated 2x  Goal of Therapy:  Vancomycin trough level 15-20 mcg/ml  Plan:  - continue vancomycin 750mg  iv q24h - Repeat vancomycin trough in 1 week if still on therapy then - follow cx and duration of antibiotic  Tryston Gilliam, Tsz-Yin 09/25/2014,8:27 AM

## 2014-09-25 NOTE — Progress Notes (Signed)
Physical Therapy Session Note  Patient Details  Name: [name] MRN: [id number] Date of Birth: 02-Jan-1931  Today's Date: 09/25/2014 PT Individual Time: 1105-1135 PT Individual Time Calculation (min): 30 min   Short Term Goals: Week 1:  PT Short Term Goal 1 (Week 1): = LTGs due to LOS  Skilled Therapeutic Interventions/Progress Updates:    Session focused on w/c propulsion to/from therapy gym for general strengthening and endurance and initiated LE HEP for strengthening and ROM for RLE as well as LLE. Pt completed in supine and sidelying RLE hip abduction, hip flexion and hip extension x 15 reps x 2 sets each, and supine and seated with RLE with 2# ankle weight completed heel slides, SAQ, LAQ, hip abduction, and marching x 15 reps each. Pt performed S transfers w/c <-> mat with cues for safe set up of w/c.   Therapy Documentation Precautions:  Precautions Precautions: Fall Restrictions Weight Bearing Restrictions: Yes RLE Weight Bearing: Non weight bearing  Pain: Denies pain. Locomotion : Ambulation Ambulation/Gait Assistance: 4: Min guard   See FIM for current functional status  Therapy/Group: Individual Therapy  Canary Brim [facility] 09/25/2014, 11:39 AM

## 2014-09-25 NOTE — Progress Notes (Signed)
[place] PHYSICAL MEDICINE & REHABILITATION     PROGRESS NOTE    Subjective/Complaints: No new issues. Slept well. Pain controlled with tylenol.   Objective: Vital Signs: Blood pressure 123/95, pulse 88, temperature 98.7 F (37.1 C), temperature source Oral, resp. rate 18, height 5' (1.524 m), weight 50.667 kg (111 lb 11.2 oz), SpO2 98.00%. Dg Chest Port 1 View  09/23/2014   CLINICAL DATA:  Left PICC line placement.  EXAM: PORTABLE CHEST - 1 VIEW  COMPARISON:  06/03/2014  FINDINGS: The heart is upper limits of normal and stable. There is tortuosity and calcification of the thoracic aorta. The pacer wires are stable. The left PICC line tip appears to be in the right atrium. It is difficult to see for certain because of superimposition of the pacer wires but I think the tip is approximately 6 cm below the carina which would place at approximately 1 cm into the right atrium.  The lungs are clear.  No pneumothorax.  IMPRESSION: The right PICC line tip is near the cavoatrial junction and likely just into the right atrium.   Electronically Signed   By: Kalman Jewels M.D.   On: 09/23/2014 17:43    Recent Labs  09/24/14 0500  WBC 6.1  HGB 9.3*  HCT 28.5*  PLT 314    Recent Labs  09/23/14 1627 09/24/14 0500  NA  --  144  K  --  3.1*  CL  --  103  GLUCOSE  --  98  BUN  --  11  CREATININE 1.09 0.98  CALCIUM  --  8.3*   CBG (last 3)  No results found for this basename: GLUCAP,  in the last 72 hours  Wt Readings from Last 3 Encounters:  09/23/14 50.667 kg (111 lb 11.2 oz)  09/22/14 59.875 kg (132 lb)  08/27/14 55.792 kg (123 lb)    Physical Exam:  Constitutional: She is oriented to person, place, and time.   HENT: oral mucosa pink and moist  Head: Normocephalic.  Eyes: EOM are normal.  Neck: Normal range of motion. Neck supple. No thyromegaly present.  Cardiovascular:  Cardiac rate controlled. No murmurs rubs or gallops  Respiratory: Effort normal and breath sounds  normal. No respiratory distress.  GI: Soft. Bowel sounds are normal. She exhibits no distension.  Neurological: She is alert and oriented to person, place, and time. RUE: deltoid 5/5, bicep 5/5, tricep 5/5, wrist ext 5/5, hand intrinsics 5/5  LUE: deltoid 5/5, bicep 5/5, triceps 5/5, wrist ext 5/5, hand instrincs 5/5  LLE: 4/5 proximal to distal. RLE: HF 3/5  Skin:  Staples intact with minmal serosanguineous drainage.   edema of stump improving Psychiatric: just waking up. Fairly alert once woken.   Assessment/Plan: 1. Functional deficits secondary to right AK (infected right TKA) which require 3+ hours per day of interdisciplinary therapy in a comprehensive inpatient rehab setting. Physiatrist is providing close team supervision and 24 hour management of active medical problems listed below. Physiatrist and rehab team continue to assess barriers to discharge/monitor patient progress toward functional and medical goals. FIM: FIM - Bathing Bathing Steps Patient Completed: Chest;Right Arm;Left Arm;Abdomen;Left upper leg;Right upper leg;Buttocks;Front perineal area;Left lower leg (including foot) Bathing: 4: Steadying assist  FIM - Upper Body Dressing/Undressing Upper body dressing/undressing steps patient completed: Thread/unthread right bra strap;Thread/unthread left bra strap;Hook/unhook bra;Thread/unthread right sleeve of pullover shirt/dresss;Put head through opening of pull over shirt/dress;Pull shirt over trunk;Thread/unthread left sleeve of pullover shirt/dress Upper body dressing/undressing: 4: Steadying assist FIM -  Lower Body Dressing/Undressing Lower body dressing/undressing steps patient completed: Thread/unthread right underwear leg;Thread/unthread left underwear leg;Pull underwear up/down;Thread/unthread right pants leg;Thread/unthread left pants leg;Pull pants up/down;Don/Doff left sock;Don/Doff left shoe;Fasten/unfasten left shoe Lower body dressing/undressing: 4: Steadying  Assist  FIM - Toileting Toileting steps completed by patient: Adjust clothing prior to toileting;Performs perineal hygiene;Adjust clothing after toileting Toileting Assistive Devices: Grab bar or rail for support Toileting: 4: Steadying assist  FIM - Radio producer Devices: Grab bars Toilet Transfers: 4-To toilet/BSC: Min A (steadying Pt. > 75%);4-From toilet/BSC: Min A (steadying Pt. > 75%)  FIM - Bed/Chair Transfer Bed/Chair Transfer: 4: Bed > Chair or W/C: Min A (steadying Pt. > 75%);4: Chair or W/C > Bed: Min A (steadying Pt. > 75%)  FIM - Locomotion: Wheelchair Distance: 150 Locomotion: Wheelchair: 5: Travels 150 ft or more: maneuvers on rugs and over door sills with supervision, cueing or coaxing FIM - Locomotion: Ambulation Locomotion: Ambulation Assistive Devices: Administrator Ambulation/Gait Assistance: 4: Min assist Locomotion: Ambulation: 1: Travels less than 50 ft with minimal assistance (Pt.>75%)  Comprehension Comprehension Mode: Auditory Comprehension: 6-Follows complex conversation/direction: With extra time/assistive device  Expression Expression Mode: Verbal Expression: 5-Expresses complex 90% of the time/cues < 10% of the time  Social Interaction Social Interaction: 6-Interacts appropriately with others with medication or extra time (anti-anxiety, antidepressant).  Problem Solving Problem Solving: 6-Solves complex problems: With extra time  Memory Memory: 6-More than reasonable amt of time  Medical Problem List and Plan:  1. Functional deficits secondary to right AKA after multiple infections related to total knee replacement 09/17/2014  2. DVT Prophylaxis/Anticoagulation: Eliquis as directed.    3. Pain Management: Neurontin 300 mg 3 times a day, oxycodone as needed.   4. Mood/depression: Remeron 15 mg daily. Provide emotional support  5. Neuropsych: This patient is capable of making decisions on her own behalf.  6.  Skin/Wound Care: Routine skin care after AKA   -stump sock preferred to shrinker    7. Fluids/Electrolytes/Nutrition: replace potassium--recheck tomorrow 8. Hypertrophic obstructive cardiomyopathy/PPM. Chest pain or shortness of breath continue Eliquis  9. ID. Continue vancomycin x2 weeks postoperatively per infectious disease for wound coverage   -afebrile. Wound looks great 10. Hypertension. Lasix 20 mg twice a day, Toprol-XL 100 mg daily.  LOS (Days) 2 A FACE TO FACE EVALUATION WAS PERFORMED  Cree Kunert T 09/25/2014 8:37 AM

## 2014-09-25 NOTE — Progress Notes (Signed)
Physical Therapy Session Note  Patient Details  Name: [name] MRN: [id number] Date of Birth: 15-Jun-1931  Today's Date: 09/25/2014 PT Individual Time: 0800-0900 PT Individual Time Calculation (min): 60 min   Short Term Goals: Week 1:  PT Short Term Goal 1 (Week 1): = LTGs due to LOS  Skilled Therapeutic Interventions/Progress Updates:   Session focused on functional transfers with and without AD (cues for technique), set-up and w/c parts management as well as w/c mobility on unit and on carpeted surface to simulate home environment, dynamic standing balance with 1 UE support reaching for horseshoes to simulate functional task with steady A using LUE and min A using RUE, gait training x 15', x 10', x 15 on tiled surface and x 10' on carpeted surface with steady A. Education on energy conservation and safety in the home. Pt able to verbalize appropriate need for use of RW vs w/c in the home.  Therapy Documentation Precautions:  Precautions Precautions: Fall Restrictions Weight Bearing Restrictions: Yes RLE Weight Bearing: Non weight bearing  Pain: Premedicated for RLE discomfort.  See FIM for current functional status  Therapy/Group: Individual Therapy  Canary Brim [facility] 09/25/2014, 9:00 AM

## 2014-09-26 ENCOUNTER — Inpatient Hospital Stay (HOSPITAL_COMMUNITY): Payer: Medicare Other | Admitting: Occupational Therapy

## 2014-09-26 ENCOUNTER — Inpatient Hospital Stay (HOSPITAL_COMMUNITY): Payer: Medicare Other

## 2014-09-26 LAB — BASIC METABOLIC PANEL
Anion gap: 8 (ref 5–15)
BUN: 17 mg/dL (ref 6–23)
CO2: 31 mEq/L (ref 19–32)
Calcium: 8.6 mg/dL (ref 8.4–10.5)
Chloride: 103 mEq/L (ref 96–112)
Creatinine, Ser: 1.41 mg/dL — ABNORMAL HIGH (ref 0.50–1.10)
GFR calc Af Amer: 39 mL/min — ABNORMAL LOW (ref >90)
GFR calc non Af Amer: 34 mL/min — ABNORMAL LOW (ref >90)
Glucose, Bld: 115 mg/dL — ABNORMAL HIGH (ref 70–99)
Potassium: 4.5 mEq/L (ref 3.7–5.3)
Sodium: 142 mEq/L (ref 137–147)

## 2014-09-26 NOTE — Progress Notes (Signed)
Physical Therapy Session Note  Patient Details  Name: [name] MRN: [id number] Date of Birth: 03/03/1931  Today's Date: 09/26/2014 PT Individual Time: 1400-1500 PT Individual Time Calculation (min): 60 min   Short Term Goals: Week 1:  PT Short Term Goal 1 (Week 1): = LTGs due to LOS  Skilled Therapeutic Interventions/Progress Updates:   Per primary OT, request for pt to write out steps for w/c <-> bed transfer so worked on this with pt and able to verbalize steps independently. Able to perform transfers with overall S throughout session. Discussed home entrance which has ramp and practiced hopping up/down 2" step in parallel bars which was difficult for patient (heavy min A). Recommend just using ramp and will d/c stair goal at this time for safety. Standing therex for RLE including hip flexion, abduction and extension x 15 reps x 2 sets. Simulated car transfer to sedan height with overall S; cues needed to remove legrest before transfer. Gait with RW x 15' with steady A; pt fatigues easily limiting gait distance (clearance of R foot decreases as fatigues) so decreased gait distance for LTG. W/c propulsion on unit overall S. Nustep for general strengthening and endurance to aid with mobility and transfers x 6 min on level 5.  Therapy Documentation Precautions:  Precautions Precautions: Fall Restrictions Weight Bearing Restrictions: Yes RLE Weight Bearing: Non weight bearing  Pain:  Denies pain.  See FIM for current functional status  Therapy/Group: Individual Therapy  Canary Brim [facility] 09/26/2014, 3:04 PM

## 2014-09-26 NOTE — Progress Notes (Signed)
Occupational Therapy Session Note  Patient Details  Name: [name] MRN: [id number] Date of Birth: 1931/10/09  Today's Date: 09/26/2014 OT Individual Time: 1:00-2:00 OT Individual Time Calculation (min): 60 min   Short Term Goals: Week 1:  OT Short Term Goal 1 (Week 1): STG=LTG  Skilled Therapeutic Interventions/Progress Updates:  Pt seated in w/c w/ husband in room when arriving, reporting no pain. Pt self-propelled w/c to apartment and completed kitchen activity, working on dynamic standing balance, safety awareness w/ w/c, UE strength and ROM, and short term memory. Pt then completed simulated pet care activity, as she has a cat that she will have some responsibility for caring for, working on dynamic sitting balance, safety awareness, and UE ROM. Pt then completed t/f to sofa and t/f to bed, needing mod v/c for remembering steps to set-up w/c at safe distance and remove foot rest and arm rest before moving from w/c. Pt reporting that she does not remember being told these steps before and she told a story that she had stated during previous session (making this the 3rd time it had been repeated in 2 days). Pt also reporting that her issues w/ memory are "recent."  Pt self-propelled w/c to rehab gym, completed t/f to and from mat, and completed UE strengthening exercise of w/c push-ups. Pt asked to get recipe for simple meal prep activity next week and write down steps for safe w/c t/f over weekend. Pt seated in w/c in rehab gym w/ PT when leaving.  Therapy Documentation Precautions:  Precautions Precautions: Fall Restrictions Weight Bearing Restrictions: Yes RLE Weight Bearing: Non weight bearing  See FIM for current functional status  Therapy/Group: Individual Therapy  Sallye Lunz Raynell 09/26/2014, 2:47 PM

## 2014-09-26 NOTE — IPOC Note (Signed)
Overall Plan of Care [facility]) Patient Details Name: [name] MRN: [id number] DOB: October 05, 1931  Admitting Diagnosis: r aka  Hospital Problems: Active Problems:   Unilateral AKA     Functional Problem List: Nursing Endurance;Medication Management;Nutrition;Pain;Safety;Skin Integrity  PT Balance;Endurance;Motor;Safety  OT Balance;Cognition;Endurance;Motor;Safety;Skin Integrity;Vision  SLP    TR         Basic ADL's: OT Grooming;Bathing;Dressing;Toileting     Advanced  ADL's: OT Simple Meal Preparation;Light Housekeeping     Transfers: PT Bed Mobility;Bed to Chair;Car;Furniture  OT Toilet;Tub/Shower     Locomotion: PT Ambulation;Wheelchair Mobility;Stairs     Additional Impairments: OT    SLP        TR      Anticipated Outcomes Item Anticipated Outcome  Self Feeding    Swallowing      Basic self-care  Mod I  Toileting  Mod I   Bathroom Transfers Mod I  Bowel/Bladder  independently continent of bowel and bladder  Transfers  modified independent  Locomotion  supervision w/c x 150' and gait x 50'; min assist up/down 1 step for home entry  Communication     Cognition     Pain  pain less than or equal to 3 on a scale of 0-10  Safety/Judgment  Remain free from falls/injury with min assist   Therapy Plan: PT Intensity: Minimum of 1-2 x/day ,45 to 90 minutes PT Frequency: 5 out of 7 days PT Duration Estimated Length of Stay: 7 days OT Intensity: Minimum of 1-2 x/day, 45 to 90 minutes OT Frequency: 5 out of 7 days OT Duration/Estimated Length of Stay: 7-10 days         Team Interventions: Nursing Interventions Patient/Family Education;Pain Management;Medication Management;Skin Care/Wound Management;Discharge Planning;Psychosocial Support  PT interventions Ambulation/gait training;Balance/vestibular training;Discharge planning;DME/adaptive equipment instruction;Functional mobility training;Patient/family education;Pain management;Neuromuscular  re-education;Psychosocial support;Splinting/orthotics;Therapeutic Exercise;Therapeutic Activities;Stair training;UE/LE Strength taining/ROM;UE/LE Coordination activities;Wheelchair propulsion/positioning  OT Interventions Balance/vestibular training;Cognitive remediation/compensation;Discharge planning;Community reintegration;DME/adaptive equipment instruction;Functional mobility training;Pain management;Psychosocial support;Patient/family education;Self Care/advanced ADL retraining;Therapeutic Exercise;UE/LE Strength taining/ROM;Visual/perceptual remediation/compensation;Wheelchair propulsion/positioning  SLP Interventions    TR Interventions    SW/CM Interventions Discharge Planning;Psychosocial Support;Patient/Family Education    Team Discharge Planning: Destination: PT-Home ,OT- Home , SLP-  Projected Follow-up: PT-Home health PT;Outpatient PT, OT-  None, SLP-  Projected Equipment Needs: PT- , OT- To be determined, SLP-  Equipment Details: PT- , OT-  Patient/family involved in discharge planning: PT- Patient,  OT-Patient, SLP-   MD ELOS: 7-9 days Medical Rehab Prognosis:  Excellent Assessment: The patient has been admitted for CIR therapies with the diagnosis of right AKA. The team will be addressing functional mobility, strength, stamina, balance, safety, adaptive techniques and equipment, self-care, bowel and bladder mgt, patient and caregiver education, pre-prosthetic education, pain mgt, ego-support. Goals have been set at mod I with self-care and ADL's and supervision with locomotion in w/c and with walker.    Meredith Staggers, MD, FAAPMR      See Team Conference Notes for weekly updates to the plan of care

## 2014-09-26 NOTE — Progress Notes (Signed)
Occupational Therapy Session Note  Patient Details  Name: [name] MRN: [id number] Date of Birth: 09/15/1931  Today's Date: 09/26/2014 OT Individual Time: 3570-1779 OT Individual Time Calculation (min): 60 min    Short Term Goals: Week 1:  OT Short Term Goal 1 (Week 1): STG=LTG  Skilled Therapeutic Interventions/Progress Updates:  Upon entering the room, pt seated in wheelchair with 5/10 c/o pain in R LE. RN providing pain medication while therapist present in the room. Pt obtaining all materials for bathing and dressing by standing with RW and steady assist for safety. Pt propelled wheelchair to bathroom and set up w/c for transfer to TTB with min verbal cues for safety awareness such as locking both brakes. Transfer w/c <> TTB with steady assist and use of grab bar for safety. While bathing pt required steady assist as well. Dressing preformed seated in wheelchair steady assist for dynamic standing balance during LB clothing management. Toileting performed as well with steady assist with RW during clothing management. Pt seated in wheelchair with call bell and all needed items within reach. Quick release belt applied.   Therapy Documentation Precautions:  Precautions Precautions: Fall Restrictions Weight Bearing Restrictions: Yes RLE Weight Bearing: Non weight bearing  See FIM for current functional status  Therapy/Group: Individual Therapy  Phineas Semen 09/26/2014, 12:34 PM

## 2014-09-26 NOTE — Progress Notes (Signed)
[place] PHYSICAL MEDICINE & REHABILITATION     PROGRESS NOTE    Subjective/Complaints: No complaints. Pain seems controlled. bp's running low   Objective: Vital Signs: Blood pressure 88/48, pulse 69, temperature 99.4 F (37.4 C), temperature source Oral, resp. rate 18, height 5' (1.524 m), weight 50.667 kg (111 lb 11.2 oz), SpO2 95.00%. No results found.  Recent Labs  09/24/14 0500  WBC 6.1  HGB 9.3*  HCT 28.5*  PLT 314    Recent Labs  09/24/14 0500 09/26/14 0500  NA 144 142  K 3.1* 4.5  CL 103 103  GLUCOSE 98 115*  BUN 11 17  CREATININE 0.98 1.41*  CALCIUM 8.3* 8.6   CBG (last 3)  No results found for this basename: GLUCAP,  in the last 72 hours  Wt Readings from Last 3 Encounters:  09/23/14 50.667 kg (111 lb 11.2 oz)  09/22/14 59.875 kg (132 lb)  08/27/14 55.792 kg (123 lb)    Physical Exam:  Constitutional: She is oriented to person, place, and time.   HENT: oral mucosa pink and moist  Head: Normocephalic.  Eyes: EOM are normal.  Neck: Normal range of motion. Neck supple. No thyromegaly present.  Cardiovascular:  Cardiac rate controlled. No murmurs rubs or gallops  Respiratory: Effort normal and breath sounds normal. No respiratory distress.  GI: Soft. Bowel sounds are normal. She exhibits no distension.  Neurological: She is alert and oriented to person, place, and time. RUE: deltoid 5/5, bicep 5/5, tricep 5/5, wrist ext 5/5, hand intrinsics 5/5  LUE: deltoid 5/5, bicep 5/5, triceps 5/5, wrist ext 5/5, hand instrincs 5/5  LLE: 4/5 proximal to distal. RLE: HF 3/5  Skin:  Staples intact with minmal serosanguineous drainage.   edema of stump improving Psychiatric: just waking up. Fairly alert once woken.   Assessment/Plan: 1. Functional deficits secondary to right AK (infected right TKA) which require 3+ hours per day of interdisciplinary therapy in a comprehensive inpatient rehab setting. Physiatrist is providing close team supervision and 24 hour  management of active medical problems listed below. Physiatrist and rehab team continue to assess barriers to discharge/monitor patient progress toward functional and medical goals. FIM: FIM - Bathing Bathing Steps Patient Completed: Chest;Right Arm;Left Arm;Abdomen;Left upper leg;Right upper leg;Buttocks;Front perineal area;Left lower leg (including foot) Bathing: 4: Steadying assist  FIM - Upper Body Dressing/Undressing Upper body dressing/undressing steps patient completed: Thread/unthread right bra strap;Thread/unthread left bra strap;Hook/unhook bra;Thread/unthread right sleeve of pullover shirt/dresss;Put head through opening of pull over shirt/dress;Pull shirt over trunk;Thread/unthread left sleeve of pullover shirt/dress Upper body dressing/undressing: 4: Steadying assist FIM - Lower Body Dressing/Undressing Lower body dressing/undressing steps patient completed: Thread/unthread right underwear leg;Thread/unthread left underwear leg;Pull underwear up/down;Thread/unthread right pants leg;Thread/unthread left pants leg;Pull pants up/down;Don/Doff left sock;Don/Doff left shoe;Fasten/unfasten left shoe Lower body dressing/undressing: 4: Steadying Assist  FIM - Toileting Toileting steps completed by patient: Adjust clothing prior to toileting;Performs perineal hygiene;Adjust clothing after toileting Toileting Assistive Devices: Grab bar or rail for support Toileting: 0: Activity did not occur  FIM - Radio producer Devices: Grab bars Toilet Transfers: 0-Activity did not occur  FIM - Control and instrumentation engineer Devices: Arm rests Bed/Chair Transfer: 4: Chair or W/C > Bed: Min A (steadying Pt. > 75%)  FIM - Locomotion: Wheelchair Distance: 150 Locomotion: Wheelchair: 5: Travels 150 ft or more: maneuvers on rugs and over door sills with supervision, cueing or coaxing FIM - Locomotion: Ambulation Locomotion: Ambulation Assistive Devices:  Administrator Ambulation/Gait Assistance: 4:  Min guard Locomotion: Ambulation: 1: Travels less than 50 ft with minimal assistance (Pt.>75%)  Comprehension Comprehension Mode: Auditory Comprehension: 5-Understands complex 90% of the time/Cues < 10% of the time  Expression Expression Mode: Verbal Expression: 5-Expresses basic 90% of the time/requires cueing < 10% of the time.  Social Interaction Social Interaction: 6-Interacts appropriately with others with medication or extra time (anti-anxiety, antidepressant).  Problem Solving Problem Solving: 5-Solves basic 90% of the time/requires cueing < 10% of the time  Memory Memory: 4-Recognizes or recalls 75 - 89% of the time/requires cueing 10 - 24% of the time  Medical Problem List and Plan:  1. Functional deficits secondary to right AKA after multiple infections related to total knee replacement 09/17/2014  2. DVT Prophylaxis/Anticoagulation: Eliquis as directed.    3. Pain Management: Neurontin 300 mg 3 times a day, oxycodone as needed.   4. Mood/depression: Remeron 15 mg daily. Provide emotional support  5. Neuropsych: This patient is capable of making decisions on her own behalf.  6. Skin/Wound Care: Routine skin care after AKA   -stump sock preferred to shrinker 7. Fluids/Electrolytes/Nutrition: potassium normal today 8. Hypertrophic obstructive cardiomyopathy/PPM. Chest pain or shortness of breath continue Eliquis  9. ID. Continue vancomycin x2 weeks postoperatively per infectious disease for wound coverage   -afebrile. Wound looks great 10. Hypertension.  Toprol-XL 100 mg daily.  -bp's running low  -hold lasix 20mg  daily for now  -may need to decrease bb also  LOS (Days) 3 A FACE TO FACE EVALUATION WAS PERFORMED  SWARTZ,ZACHARY T 09/26/2014 8:32 AM

## 2014-09-26 NOTE — Progress Notes (Signed)
Note reviewed and accurately reflects treatment session.   

## 2014-09-27 ENCOUNTER — Encounter (HOSPITAL_COMMUNITY): Payer: Medicare Other | Admitting: Occupational Therapy

## 2014-09-27 ENCOUNTER — Inpatient Hospital Stay (HOSPITAL_COMMUNITY): Payer: Medicare Other | Admitting: *Deleted

## 2014-09-27 ENCOUNTER — Inpatient Hospital Stay (HOSPITAL_COMMUNITY): Payer: Medicare Other | Admitting: Physical Therapy

## 2014-09-27 DIAGNOSIS — I4891 Unspecified atrial fibrillation: Secondary | ICD-10-CM

## 2014-09-27 LAB — BASIC METABOLIC PANEL
Anion gap: 11 (ref 5–15)
BUN: 21 mg/dL (ref 6–23)
CO2: 29 mEq/L (ref 19–32)
Calcium: 8.6 mg/dL (ref 8.4–10.5)
Chloride: 104 mEq/L (ref 96–112)
Creatinine, Ser: 1.47 mg/dL — ABNORMAL HIGH (ref 0.50–1.10)
GFR calc Af Amer: 37 mL/min — ABNORMAL LOW (ref >90)
GFR calc non Af Amer: 32 mL/min — ABNORMAL LOW (ref >90)
Glucose, Bld: 108 mg/dL — ABNORMAL HIGH (ref 70–99)
Potassium: 4.5 mEq/L (ref 3.7–5.3)
Sodium: 144 mEq/L (ref 137–147)

## 2014-09-27 LAB — VANCOMYCIN, TROUGH: Vancomycin Tr: 16.4 ug/mL (ref 10.0–20.0)

## 2014-09-27 MED ORDER — METOPROLOL SUCCINATE ER 25 MG PO TB24
25.0000 mg | ORAL_TABLET | Freq: Every day | ORAL | Status: DC
  Administered 2014-09-28 – 2014-10-01 (×4): 25 mg via ORAL
  Filled 2014-09-27 (×5): qty 1

## 2014-09-27 NOTE — Progress Notes (Signed)
[name] is a 80 y.o. female Jan 12, 1931 [id number]  Subjective: No new complaints. No new problems. Slept well. Feeling OK.  Objective: Vital signs in last 24 hours: Temp:  [97.5 F (36.4 C)-98 F (36.7 C)] 98 F (36.7 C) (10/10 0529) Pulse Rate:  [61-95] 70 (10/10 0537) Resp:  [16-18] 16 (10/10 0529) BP: (82-102)/(44-54) 99/54 mmHg (10/10 0537) SpO2:  [96 %-100 %] 96 % (10/10 0529) Weight change:  Last BM Date: 09/25/14  Intake/Output from previous day: 10/09 0701 - 10/10 0700 In: 67 [P.O.:960] Out: -  Last cbgs: CBG (last 3)  No results found for this basename: GLUCAP,  in the last 72 hours   Physical Exam General: No apparent distress   HEENT: not dry Lungs: Normal effort. Lungs clear to auscultation, no crackles or wheezes. Cardiovascular: Regular rate and rhythm, no edema Abdomen: S/NT/ND; BS(+) Musculoskeletal:  unchanged Neurological: No new neurological deficits Wounds: N/A    Skin: clear  Aging changes Mental state: Alert, oriented, cooperative    Lab Results: BMET    Component Value Date/Time   NA 144 09/27/2014 0737   NA 143 10/24/2013 0934   K 4.5 09/27/2014 0737   K 4.3 10/24/2013 0934   CL 104 09/27/2014 0737   CL 105 04/24/2013 1002   CO2 29 09/27/2014 0737   CO2 26 10/24/2013 0934   GLUCOSE 108* 09/27/2014 0737   GLUCOSE 112 10/24/2013 0934   GLUCOSE 78 04/24/2013 1002   BUN 21 09/27/2014 0737   BUN 19.6 10/24/2013 0934   CREATININE 1.47* 09/27/2014 0737   CREATININE 1.1 10/24/2013 0934   CALCIUM 8.6 09/27/2014 0737   CALCIUM 9.4 10/24/2013 0934   GFRNONAA 32* 09/27/2014 0737   GFRAA 37* 09/27/2014 0737   CBC    Component Value Date/Time   WBC 6.1 09/24/2014 0500   WBC 6.3 10/24/2013 0934   WBC 6.0 07/18/2011 1038   RBC 3.11* 09/24/2014 0500   RBC 3.45* 10/24/2013 0934   RBC 3.89 07/18/2011 1038   HGB 9.3* 09/24/2014 0500   HGB 11.6 10/24/2013 0934   HGB 13.1 07/18/2011 1038   HCT 28.5* 09/24/2014 0500   HCT 35.7 10/24/2013 0934   HCT  37.6 07/18/2011 1038   PLT 314 09/24/2014 0500   PLT 159 10/24/2013 0934   PLT 159 07/18/2011 1038   MCV 91.6 09/24/2014 0500   MCV 103.3* 10/24/2013 0934   MCV 97 07/18/2011 1038   MCH 29.9 09/24/2014 0500   MCH 33.6 10/24/2013 0934   MCH 33.7 07/18/2011 1038   MCHC 32.6 09/24/2014 0500   MCHC 32.5 10/24/2013 0934   MCHC 34.8 07/18/2011 1038   RDW 18.1* 09/24/2014 0500   RDW 15.4* 10/24/2013 0934   RDW 13.4 07/18/2011 1038   LYMPHSABS 0.9 09/24/2014 0500   LYMPHSABS 0.8* 10/24/2013 0934   LYMPHSABS 0.7* 07/18/2011 1038   MONOABS 0.8 09/24/2014 0500   MONOABS 0.7 10/24/2013 0934   EOSABS 0.1 09/24/2014 0500   EOSABS 0.1 10/24/2013 0934   EOSABS 0.1 07/18/2011 1038   BASOSABS 0.0 09/24/2014 0500   BASOSABS 0.0 10/24/2013 0934   BASOSABS 0.0 07/18/2011 1038    Studies/Results: No results found.  Medications: I have reviewed the patient's current medications.  Assessment/Plan:  1. Functional deficits secondary to right AKA after multiple infections related to total knee replacement 09/17/2014  2. DVT Prophylaxis/Anticoagulation: Eliquis as directed.  3. Pain Management: Neurontin 300 mg 3 times a day, oxycodone as needed.  4. Mood/depression: Remeron 15 mg daily. Provide  emotional support  5. Neuropsych: This patient is capable of making decisions on her own behalf.  6. Skin/Wound Care: Routine skin care after AKA  -stump sock preferred to shrinker  7. Fluids/Electrolytes/Nutrition: potassium normal today  8. Hypertrophic obstructive cardiomyopathy/PPM. Chest pain or shortness of breath continue Eliquis  9. ID. Continue vancomycin x2 weeks postoperatively per infectious disease for wound coverage  -afebrile. Wound looks great  10. Hypertension -bp's running low - will reduce Toprol dose      Length of stay, days: 4  Walker Kehr , MD 09/27/2014, 9:05 AM

## 2014-09-27 NOTE — Progress Notes (Signed)
Occupational Therapy Session Note  Patient Details  Name: MCKALA PANTALEON MRN: [id number] Date of Birth: June 17, 1931  Today's Date: 09/27/2014 OT Individual Time: 1102-1202 OT Individual Time Calculation (min): 60 min    Short Term Goals: Week 1:  OT Short Term Goal 1 (Week 1): STG=LTG  Skilled Therapeutic Interventions/Progress Updates:  Pt with no c/o pain this session. Pt received easily from another OT session with pt seated in wheelchair. Pt previously wrote down ingredients for cake she will make next week. Pt standing with RW and navigating kitchen cabinets to determine if ingredients for cake are present or needing to be purchased. Pt required steady assist with standing ~ 5 minutes before requesting to sit secondary to fatigue.Pt required 1 verbal cues to lock breaks before standing. Pt then propelled wheelchair to refrigerator to check expiration dates of milk and eggs for cake with supervision. Pt propelled wheelchair ~ 250 feet to gift show for community wheelchair mobility activity. Pt navigated self through aisle and turned self around in tight corners with increased time. Pt navigated self outside onto carpet surface and sidewalk with various types of surfaces, thresholds, and uneven surfaces with supervision and increased time. Therapist assisted pt back to room where she was seated in w/c with call bell and all needed items within reach upon exiting the room.   Therapy Documentation Precautions:  Precautions Precautions: Fall Restrictions Weight Bearing Restrictions: Yes RLE Weight Bearing: Non weight bearing   See FIM for current functional status  Therapy/Group: Individual Therapy  Phineas Semen 09/27/2014, 4:09 PM

## 2014-09-27 NOTE — Progress Notes (Signed)
Occupational Therapy Session Note  Patient Details  Name: [name] MRN: [id number] Date of Birth: 1931/10/15  Today's Date: 09/27/2014 OT Individual Time:  -   1030-1100  (30 min) Short Term Goals: Week 1:  OT Short Term Goal 1 (Week 1): STG=LTG  Skilled Therapeutic Interventions/Progress Updates:     Addressed wc mobility, transfers, endurance, strength.  Pt propelled WC  from room to gym.  Transferred from wc to nustep with minimal assist.  Used nustep for 5 mins , rest and another 5 minutes.  Did 452 steps on 2 work load, 45 spm, rest then 202 step at 26 spm on 2 wkoad.  Propelled wc to ADL apartment and left pt with next therapist.   Therapy Documentation Precautions:  Precautions Precautions: Fall Restrictions Weight Bearing Restrictions: Yes RLE Weight Bearing: Non weight bearing       Pain: Pain Assessment Pain Score:4 Pain Type:Pain   Location:right shoulderPain Orientation: Right Pain Intervention(s): Medication (See eMAR)       See FIM for current functional status  Therapy/Group: Individual Therapy  Lisa Roca 09/27/2014, 10:51 AM

## 2014-09-27 NOTE — Progress Notes (Signed)
Rx reviewed Vanc trough levels, "okay" to give medication as ordered.

## 2014-09-27 NOTE — Progress Notes (Signed)
MEDICATION RELATED NOTE - Gabapentin  Labs:  Recent Labs  09/26/14 0500 09/27/14 0737  CREATININE 1.41* 1.47*   Estimated Creatinine Clearance: 21.6 ml/min (by C-G formula based on Cr of 1.47).    Medications:  Scheduled:  . apixaban  2.5 mg Oral BID  . calcium carbonate  1 tablet Oral TID  . cyclobenzaprine  5 mg Oral QHS  . cycloSPORINE  1 drop Both Eyes BID  . fluticasone  1 spray Each Nare Daily  . gabapentin  300 mg Oral TID  . [START ON 09/28/2014] metoprolol succinate  25 mg Oral Daily  . mirtazapine  15 mg Oral QHS  . multivitamin with minerals  1 tablet Oral Daily  . potassium chloride  20 mEq Oral BID  . senna-docusate  1 tablet Oral BID  . sodium chloride  10-40 mL Intracatheter Q12H  . vancomycin  750 mg Intravenous Q24H    Assessment: 80yo with multiple medical problems.  She has been placed on Gabapentin 300mg  tid for pain management support.  Today she continues to have renal insufficiency with creatinine of 1.47 and an estimated crcl of 77ml/min.  This medication requires renal dose adjustment as noted below:  Renal Dosing  [>60 ml/min]: Give usual dosage  [30-59]: Dosage range: 400-1400mg /day  (divided doses - Usually bid)  [15-29]: Dosage range: 200-700mg /day.  [<15]: 100-300 mg/day. Use lower end of this range for CRCL <7.5 ml/min.   Plan:  Consider changing regimen to 300mg  bid so that she has a longer interval to clear the medication.  Rober Minion, PharmD., MS Clinical Pharmacist Pager:  [phone] Thank you for allowing pharmacy to be part of this patients care team. 09/27/2014,3:11 PM

## 2014-09-27 NOTE — Progress Notes (Signed)
No MD order for daily dressing change to R.AKA incision.  Gauze/pads visibly soiled (moderate amount of serous drainage) and changed by RN.  Stump shrinker reapplied.  Staples intact, no s/s of infection.

## 2014-09-27 NOTE — Progress Notes (Signed)
Physical Therapy Session Note  Patient Details  Name: [name] MRN: [id number] Date of Birth: 08-Aug-1931  Today's Date: 09/27/2014 PT Individual Time: 1300-1400 PT Individual Time Calculation (min): 60 min   Short Term Goals: Week 1:  PT Short Term Goal 1 (Week 1): = LTGs due to LOS  Skilled Therapeutic Interventions/Progress Updates:   Pt received sitting in w/c in room, agreeable to therapy session.  Assisted pt to therapy gym via w/c at total A level.  Skilled session focused on gait training for quality and distance, supine and SL therex for RLE strengthening, functional transfers to varying surfaces to better simulate home, w/c management with transfers, negotiating ramp for home entry and seated therex on nustep.  Performed several transfers throughout session to mat, bed in ADL apt, couch, nustep, w/c all at distant S level.  Pt did very well problem solving how to manage w/c and parts for safe set up of w/c prior to transfers.  Performed supine therex; R SLR x 10 reps, glute press x 10 reps RLE, SL R hip abd x 10 reps, SL R hip ext x 10 reps and LLE bridging x 10 reps.  Then performed standing mini squats 2 sets of 10 reps with cues for decreased UE use for LLE strengthening. Performed 21' x 2 gait with RW at min/guard to close S level with cues for increased clearance of LLE and relaxed posture/increased use of lats during hopping.  Ended session with seated nustep x 8 mins with LLE and BUE at level 2-3 resistance x 8 mins with single rest break.  Discussed possible D/C on Monday after therapies if primary PT feels that it is appropriate and all goals have been met.  Will leave sticky note for primary PT.  Pt self propelled back to room and left in w/c with quick release belt donned and all needs in reach.   Therapy Documentation Precautions:  Precautions Precautions: Fall Restrictions Weight Bearing Restrictions: Yes RLE Weight Bearing: Non weight bearing Therapy Vitals Temp:  97.6 F (36.4 C) Temp Source: Oral Pulse Rate: 64 Resp: 17 BP: 93/46 mmHg Patient Position (if appropriate): Sitting Oxygen Therapy SpO2: 98 % O2 Device: None (Room air) Pain: Pain Assessment Pain Assessment: No/denies pain Pain Score: 0-No pain Faces Pain Scale: No hurt Pain Type: Phantom pain Pain Location: Leg Pain Orientation: Right Pain Intervention(s): Medication (See eMAR) PAINAD (Pain Assessment in Advanced Dementia) Breathing: normal  See FIM for current functional status  Therapy/Group: Individual Therapy  Denice Bors 09/27/2014, 3:19 PM

## 2014-09-27 NOTE — Progress Notes (Signed)
Physical Therapy Session Note  Patient Details  Name: [name] MRN: [id number] Date of Birth: [birth year]  Today's Date: 09/27/2014 PT Individual Time: 1410-1440 PT Individual Time Calculation (min): 30 min   Short Term Goals: Week 1:  PT Short Term Goal 1 (Week 1): = LTGs due to LOS  Skilled Therapeutic Interventions/Progress Updates:    Pt received seated in w/c with quick release belt on; agreeable to therapy. Session focused on functional transfers and addressing phantom limb pain. Pt performed w/c mobility 2x200' in controlled environment with bilat UE's and supervision. In ortho gym, pt performed squat pivot transfer from w/c<>simulated car with supervision, cueing for safe hand placement. Transitioned to blocked practice of squat pivot transfers from w/c<>unlevel surfaces, which pt performed with supervision. Pt reporting R phantom limb pain described as "like someone is stepping on my foot." Explained, demonstrated use of mirror therapy for phantom limb pain. Pt gave effective return demonstration and reported mitigated phantom limb pain after mirror therapy. Session ended in pt room, where pt was left seated in w/c with quick release belt in place for safety and all needs within reach.  Therapy Documentation Precautions:  Precautions Precautions: Fall Restrictions Weight Bearing Restrictions: Yes RLE Weight Bearing: Non weight bearing Pain: Pain Assessment Pain Assessment: 0-10 Pain Score: 5  Pain Type: Phantom pain Pain Location: Leg Pain Orientation: Right Pain Descriptors / Indicators: Pressure Pain Onset: With Activity Pain Intervention(s): Other (Comment) (mirror therapy) Multiple Pain Sites: No Locomotion : Ambulation Ambulation/Gait Assistance: 4: Min guard;5: Supervision Wheelchair Mobility Distance: 200   See FIM for current functional status  Therapy/Group: Individual Therapy  Philopater Mucha, Malva Cogan 09/27/2014, 6:10 PM

## 2014-09-27 NOTE — Progress Notes (Signed)
ANTIBIOTIC CONSULT NOTE - FOLLOW UP  Pharmacy Consult for vancomycin Indication: knee wound infection  Allergies  Allergen Reactions  . Ceftaroline Rash  . Codeine Nausea And Vomiting  . Morphine Nausea And Vomiting  . Sulfa Antibiotics Nausea And Vomiting and Nausea Only   Patient Measurements: Height: 5' (152.4 cm) Weight: 111 lb 11.2 oz (50.667 kg) IBW/kg (Calculated) : 45.5  Vital Signs: Temp: 98 F (36.7 C) (10/10 0529) Temp Source: Oral (10/10 0529) BP: 99/54 mmHg (10/10 0537) Pulse Rate: 70 (10/10 0537) Intake/Output from previous day: 10/09 0701 - 10/10 0700 In: 960 [P.O.:960] Out: -  Intake/Output from this shift: Total I/O In: 320 [P.O.:320] Out: -   Labs:  Recent Labs  09/26/14 0500 09/27/14 0737  CREATININE 1.41* 1.47*   Estimated Creatinine Clearance: 21.6 ml/min (by C-G formula based on Cr of 1.47).  Recent Labs  09/25/14 0800 09/27/14 0737  VANCOTROUGH 16.2 16.4     Microbiology: No results found for this or any previous visit (from the past 720 hour(s)).  Anti-infectives   Start     Dose/Rate Route Frequency Ordered Stop   09/24/14 0830  vancomycin (VANCOCIN) IVPB 750 mg/150 ml premix     750 mg 150 mL/hr over 60 Minutes Intravenous Every 24 hours 09/23/14 1600       Assessment: 80 years old female with right knee wound infection s/p AKA is currently on therapeutic vancomycin.  Vancomycin trough is 16.4 today with a goal of 15-20 mcg/ml.  She is tolerating current regimen without accumulation despite bump in her creatinine.  Her creatinine today is 1.47 with an estimated crcl of 43ml/min.    Goal of Therapy:  Vancomycin trough level 15-20 mcg/ml  Plan:  - continue vancomycin 750mg  iv q24h - Repeat vancomycin trough in 1 week unless she has further worsening of renal function. - follow cx and duration of antibiotic  Rober Minion, PharmD., MS Clinical Pharmacist Pager:  [phone] Thank you for allowing pharmacy to be part of  this patients care team. 09/27/2014,10:45 AM

## 2014-09-28 ENCOUNTER — Inpatient Hospital Stay (HOSPITAL_COMMUNITY): Payer: Medicare Other

## 2014-09-28 DIAGNOSIS — C50912 Malignant neoplasm of unspecified site of left female breast: Secondary | ICD-10-CM

## 2014-09-28 DIAGNOSIS — I442 Atrioventricular block, complete: Secondary | ICD-10-CM

## 2014-09-28 NOTE — Progress Notes (Signed)
Subjective: No new complaints. No new problems. Slept well. Feeling OK.  Objective: Vital signs in last 24 hours: Temp:  [97.6 F (36.4 C)-98.3 F (36.8 C)] 98.3 F (36.8 C) (10/11 0512) Pulse Rate:  [56-73] 73 (10/11 0512) Resp:  [17] 17 (10/11 0512) BP: (87-94)/(46-49) 94/49 mmHg (10/11 0512) SpO2:  [94 %-98 %] 96 % (10/11 0512) Weight change:  Last BM Date: 09/27/14  Intake/Output from previous day: 10/10 0701 - 10/11 0700 In: 920 [P.O.:920] Out: -  Last cbgs: CBG (last 3)  No results found for this basename: GLUCAP,  in the last 72 hours   Physical Exam General: No apparent distress   HEENT: not dry Lungs: Normal effort. Lungs clear to auscultation, no crackles or wheezes. Cardiovascular: Regular rate and rhythm, no edema Abdomen: S/NT/ND; BS(+) Musculoskeletal:  unchanged Neurological: No new neurological deficits Wounds: N/A    Skin: clear  Aging changes Mental state: Alert, oriented, cooperative    Lab Results: BMET    Component Value Date/Time   NA 144 09/27/2014 0737   NA 143 10/24/2013 0934   K 4.5 09/27/2014 0737   K 4.3 10/24/2013 0934   CL 104 09/27/2014 0737   CL 105 04/24/2013 1002   CO2 29 09/27/2014 0737   CO2 26 10/24/2013 0934   GLUCOSE 108* 09/27/2014 0737   GLUCOSE 112 10/24/2013 0934   GLUCOSE 78 04/24/2013 1002   BUN 21 09/27/2014 0737   BUN 19.6 10/24/2013 0934   CREATININE 1.47* 09/27/2014 0737   CREATININE 1.1 10/24/2013 0934   CALCIUM 8.6 09/27/2014 0737   CALCIUM 9.4 10/24/2013 0934   GFRNONAA 32* 09/27/2014 0737   GFRAA 37* 09/27/2014 0737   CBC    Component Value Date/Time   WBC 6.1 09/24/2014 0500   WBC 6.3 10/24/2013 0934   WBC 6.0 07/18/2011 1038   RBC 3.11* 09/24/2014 0500   RBC 3.45* 10/24/2013 0934   RBC 3.89 07/18/2011 1038   HGB 9.3* 09/24/2014 0500   HGB 11.6 10/24/2013 0934   HGB 13.1 07/18/2011 1038   HCT 28.5* 09/24/2014 0500   HCT 35.7 10/24/2013 0934   HCT 37.6 07/18/2011 1038   PLT 314 09/24/2014 0500   PLT 159  10/24/2013 0934   PLT 159 07/18/2011 1038   MCV 91.6 09/24/2014 0500   MCV 103.3* 10/24/2013 0934   MCV 97 07/18/2011 1038   MCH 29.9 09/24/2014 0500   MCH 33.6 10/24/2013 0934   MCH 33.7 07/18/2011 1038   MCHC 32.6 09/24/2014 0500   MCHC 32.5 10/24/2013 0934   MCHC 34.8 07/18/2011 1038   RDW 18.1* 09/24/2014 0500   RDW 15.4* 10/24/2013 0934   RDW 13.4 07/18/2011 1038   LYMPHSABS 0.9 09/24/2014 0500   LYMPHSABS 0.8* 10/24/2013 0934   LYMPHSABS 0.7* 07/18/2011 1038   MONOABS 0.8 09/24/2014 0500   MONOABS 0.7 10/24/2013 0934   EOSABS 0.1 09/24/2014 0500   EOSABS 0.1 10/24/2013 0934   EOSABS 0.1 07/18/2011 1038   BASOSABS 0.0 09/24/2014 0500   BASOSABS 0.0 10/24/2013 0934   BASOSABS 0.0 07/18/2011 1038    Studies/Results: No results found.  Medications: I have reviewed the patient's current medications.  Assessment/Plan:  1. Functional deficits secondary to right AKA after multiple infections related to total knee replacement 09/17/2014  2. DVT Prophylaxis/Anticoagulation: Eliquis as directed.  3. Pain Management: Neurontin 300 mg 3 times a day, oxycodone as needed.  4. Mood/depression: Remeron 15 mg daily. Provide emotional support  5. Neuropsych: This patient is capable of making  decisions on her own behalf.  6. Skin/Wound Care: Routine skin care after AKA  -stump sock preferred to shrinker  7. Fluids/Electrolytes/Nutrition: potassium normal today  8. Hypertrophic obstructive cardiomyopathy/PPM. Chest pain or shortness of breath continue Eliquis  9. ID. Continue vancomycin x2 weeks postoperatively per infectious disease for wound coverage  -afebrile. Wound looks great  10. Hypertension -bp's running low - we reduced Toprol dose: will watch   Continue with current  therapy otherwise.    Length of stay, days: 5  Walker Kehr , MD 09/28/2014, 8:46 AM

## 2014-09-28 NOTE — Progress Notes (Signed)
Social Work  Social Work Assessment and Plan  Patient Details  Name: [name] MRN: [id number] Date of Birth: 1931/05/17  Today's Date: 09/26/2014  Problem List:  Patient Active Problem List   Diagnosis Date Noted  . Unilateral AKA 09/23/2014  . Preop cardiovascular exam 01/16/2014  . Atrioventricular block, complete 07/03/2013  . Atrial fibrillation 07/03/2013  . Breast cancer, left breast 04/24/2013  . Other symptoms involving cardiovascular system 12/22/2010  . PACEMAKER, PERMANENT 06/21/2010  . MITRAL REGURGITATION 06/17/2010  . HYPERTENSION 06/17/2010  . HOCM (hypertrophic obstructive cardiomyopathy) 06/17/2010  . HEART BLOCK 06/17/2010  . HEMORRHOIDS 06/17/2010  . BRONCHITIS 06/17/2010  . ASTHMA 06/17/2010  . CYSTITIS 06/17/2010  . CHEST PAIN 06/17/2010  . URINARY INCONTINENCE 06/17/2010   Past Medical History:  Past Medical History  Diagnosis Date  . Mitral regurgitation   . Hypertrophic obstructive cardiomyopathy     Required septal myotomy  . Urinary incontinence   . Cystitis   . Hemorrhoids   . Bronchitis   . Asthma   . Anemia   . Heart block     Complete heart block post surgical requiring pacer  . Hypokalemia     Postoperative, improved  . Hyponatremia     Mild postoperative, improved  . Blood transfusion   . Constipation   . Arthritis pain   . Cancer     lt breast  . Hypertension     Benign Essential Hypertension    . Current use of long term anticoagulation   . Chronic atrial fibrillation   . Arthritis   . Pacemaker    Past Surgical History:  Past Surgical History  Procedure Laterality Date  . Anterior cervical diskectomy and fusion      C-7  . Septal myotomy    . Pacemaker placement  2006/11-27-2013    MDT dual chamber pacemaker implanted 2006 with gen change by Dr Lovena Le 11-27-2013  . Replacement total knee bilateral    . Femur fracture surgery      Right  . Wrist fracture surgery      Right  . Shoulder surgery      Right   . Breast biopsy      Left, negative  . Colonoscopy    . Abdominal hysterectomy    . Cardiomyopathy    . Breast lumpectomy  2012    left breast  . Joint replacement     Social History:  reports that she has never smoked. She does not have any smokeless tobacco history on file. She reports that she does not drink alcohol or use illicit drugs.  Family / Support Systems Marital Status: Married How Long?: 30 yrs Patient Roles: Spouse Spouse/Significant Other: spouse, Perline Awe @ [phone] or ([phone] Children: Pt has two sons from first marraige: [name] [place].) and Laverna Peace (lives on pt's property);  spouse has 3 children living locally:  Carlene Bickley (also living on pt's property) @ (C) [phone] Anticipated Caregiver: husband/family Ability/Limitations of Caregiver: no limitations Caregiver Availability: 24/7 Family Dynamics: Pt describes good relationship with all their children and between the children.  Social History Preferred language: English Religion: Baptist Cultural Background: NA Education: HS Read: Yes Write: Yes Employment Status: Retired Freight forwarder Issues: None Guardian/Conservator: None - per MD, pt capable of making decisions on her own behalf.   Abuse/Neglect Physical Abuse: Denies Verbal Abuse: Denies Sexual Abuse: Denies Exploitation of patient/patient's resources: Denies Self-Neglect: Denies  Emotional Status Pt's affect, behavior adn adjustment status: Pt very  talkative and occasionall needs clarification on questions.  She seems to ramble at times about her family.  Oriented x3 and motivated for CIR.  Denies any emotional distress about her loss of limb explaining that she had been through so many attempts to salvage the leg that she was "...actually ok with them taking it on off."  No s/s of depression or anxiety of note - will monitor and refer for neuropsychology vs peer visit. Recent Psychosocial Issues: None noted other than  chronic health issues. Pyschiatric History: None  Patient / Family Perceptions, Expectations & Goals Pt/Family understanding of illness & functional limitations: Pt and family with basic understanding of medical issues that ultimately led to need for AKA.  Good understanding of her physical, functional limitations and need for CIR. Premorbid pt/family roles/activities: Pt notes she, husband and other family members shared in the upkeep of their home.  Pt still out in the community PTA and driving. Anticipated changes in roles/activities/participation: May be little role change if supervision goals are met.  Husband may need to assume some more caregiver responsibilities. Pt/family expectations/goals: Pt eager to return home as soon as possible, however, admits she is "a little concerned" that her amputation site still having "drainage".  Community [employer] Agencies: None Premorbid Home Care/DME Agencies: Other (Comment) [facility]) Transportation available at discharge: yes Resource referrals recommended: Neuropsychology;Support group (specify)  Discharge Planning Living Arrangements: Spouse/significant other Support Systems: Spouse/significant other;Children;Other relatives;Friends/neighbors Type of Residence: Private residence Insurance Resources: Education officer, museum (specify) Sports administrator) Financial Resources: Radio broadcast assistant Screen Referred: No Living Expenses: Own Money Management: Spouse Does the patient have any problems obtaining your medications?: No Home Management: shared among family Patient/Family Preliminary Plans: pt fully intends to d/c home with her husband as primary support. Social Work Anticipated Follow Up Needs: HH/OP Expected length of stay: 7-10 days  Clinical Impression Very pleasant, talkative woman on CIR following AKA.  Good family support and denying any emotional distress r/t her surgery.  Admits she was growing tired of the repeated  medical attempts to salvage the limb.  Denies any s/s of depression or anxiety but will monitor.  Family able to provide 24/7 care.  Will follow for support and d/c planning needs.  Izyan Ezzell 09/26/2014, 5:19 PM

## 2014-09-28 NOTE — Progress Notes (Signed)
Physical Therapy Session Note  Patient Details  Name: [name] MRN: [id number] Date of Birth: [birth year]  Today's Date: 09/28/2014 PT Group Time: 6808-8110 PT Group Time Calculation (min): 45 min  Short Term Goals: Week 1:  PT Short Term Goal 1 (Week 1): = LTGs due to LOS  Skilled Therapeutic Interventions/Progress Updates:    Pt engaged in LE exercise group for strengthening and social support. Exercises included seated hip flexion, LAQ, glute sets, isometric hip abd, hip add all 2x10 w/ 2# ankle weights. Additionally pt ambulated 38' w/ RW and MinGuard A, completed 7' on Nustep L2. Pt tolerated exercises and Nustep well, very fatigued at end of ambulation. Pt left seated in w/c w/ family present and all needs within reach.  Therapy Documentation Precautions:  Precautions Precautions: Fall Restrictions Weight Bearing Restrictions: Yes RLE Weight Bearing: Non weight bearing General:   Vital Signs: Therapy Vitals Temp: 97.4 F (36.3 C) Temp Source: Oral Pulse Rate: 71 Resp: 18 BP: 91/59 mmHg Patient Position (if appropriate): Sitting Pain:   Not rated, "just a little bit, I already got my pill for it" Mobility:   Locomotion : Ambulation Ambulation/Gait Assistance: 4: Min guard;5: Supervision  Trunk/Postural Assessment :    Balance:   Exercises:   Other Treatments:    See FIM for current functional status  Therapy/Group: Group Therapy  Rada Hay Rada Hay, PT, DPT 09/28/2014, 4:19 PM

## 2014-09-28 NOTE — Care Management Note (Signed)
[facility] Individual Statement of Services  Patient Name:  [name]  Date:  09/26/2014  Welcome to the [facility].  Our goal is to provide you with an individualized program based on your diagnosis and situation, designed to meet your specific needs.  With this comprehensive rehabilitation program, you will be expected to participate in at least 3 hours of rehabilitation therapies Monday-Friday, with modified therapy programming on the weekends.  Your rehabilitation program will include the following services:  Physical Therapy (PT), Occupational Therapy (OT), 24 hour per day rehabilitation nursing, Therapeutic Recreaction (TR), Case Management (Social Worker), Rehabilitation Medicine, Nutrition Services and Pharmacy Services  Weekly team conferences will be held on Tuesdays to discuss your progress.  Your Social Worker will talk with you frequently to get your input and to update you on team discussions.  Team conferences with you and your family in attendance may also be held.  Expected length of stay: 7 days  Overall anticipated outcome: supervision  Depending on your progress and recovery, your program may change. Your Social Worker will coordinate services and will keep you informed of any changes. Your Social Worker's name and contact numbers are listed  below.  The following services may also be recommended but are not provided by the [facility] will be made to provide these services after discharge if needed.  Arrangements include referral to agencies that provide these services.  Your insurance has been verified to be:  Medicare and Tricare Your primary doctor is:  Dr. Jani Gravel  Pertinent information will be shared with your doctor and your insurance company.  Social Worker:  [place], [place] or ([phone]   Information discussed with and copy given to patient by: Lennart Pall, 09/26/2014, 5:23 PM

## 2014-09-29 ENCOUNTER — Inpatient Hospital Stay (HOSPITAL_COMMUNITY): Payer: Medicare Other

## 2014-09-29 ENCOUNTER — Encounter (HOSPITAL_COMMUNITY): Payer: Medicare Other | Admitting: Occupational Therapy

## 2014-09-29 LAB — CREATININE, SERUM
Creatinine, Ser: 1.26 mg/dL — ABNORMAL HIGH (ref 0.50–1.10)
GFR calc Af Amer: 45 mL/min — ABNORMAL LOW (ref >90)
GFR calc non Af Amer: 39 mL/min — ABNORMAL LOW (ref >90)

## 2014-09-29 NOTE — Progress Notes (Signed)
Note reviewed and accurately reflects treatment session.   

## 2014-09-29 NOTE — Progress Notes (Signed)
[facility] PHYSICAL MEDICINE & REHABILITATION     PROGRESS NOTE    Subjective/Complaints: Leg pain fairly well controlled. Had a good weekend  Objective: Vital Signs: Blood pressure 102/48, pulse 71, temperature 98.3 F (36.8 C), temperature source Oral, resp. rate 18, height 5' (1.524 m), weight 50.667 kg (111 lb 11.2 oz), SpO2 96.00%. No results found. No results found for this basename: WBC, HGB, HCT, PLT,  in the last 72 hours  Recent Labs  09/27/14 0737  NA 144  K 4.5  CL 104  GLUCOSE 108*  BUN 21  CREATININE 1.47*  CALCIUM 8.6   CBG (last 3)  No results found for this basename: GLUCAP,  in the last 72 hours  Wt Readings from Last 3 Encounters:  09/23/14 50.667 kg (111 lb 11.2 oz)  09/22/14 59.875 kg (132 lb)  08/27/14 55.792 kg (123 lb)    Physical Exam:  Constitutional: She is oriented to person, place, and time.   HENT: oral mucosa pink and moist  Head: Normocephalic.  Eyes: EOM are normal.  Neck: Normal range of motion. Neck supple. No thyromegaly present.  Cardiovascular:  Cardiac rate controlled. No murmurs rubs or gallops  Respiratory: Effort normal and breath sounds normal. No respiratory distress.  GI: Soft. Bowel sounds are normal. She exhibits no distension.  Neurological: She is alert and oriented to person, place, and time. RUE: deltoid 5/5, bicep 5/5, tricep 5/5, wrist ext 5/5, hand intrinsics 5/5  LUE: deltoid 5/5, bicep 5/5, triceps 5/5, wrist ext 5/5, hand instrincs 5/5  LLE: 4/5 proximal to distal. RLE: HF 3+/5  Skin:  Staples intact with minmal serosanguineous drainage from medial aspect of wound.    Psychiatric: just waking up. Fairly alert once woken.   Assessment/Plan: 1. Functional deficits secondary to right AK (infected right TKA) which require 3+ hours per day of interdisciplinary therapy in a comprehensive inpatient rehab setting. Physiatrist is providing close team supervision and 24 hour management of active medical problems  listed below. Physiatrist and rehab team continue to assess barriers to discharge/monitor patient progress toward functional and medical goals.  FIM: FIM - Bathing Bathing Steps Patient Completed: Chest;Right Arm;Left Arm;Abdomen;Front perineal area;Buttocks;Right upper leg;Left upper leg;Left lower leg (including foot) Bathing: 4: Steadying assist  FIM - Upper Body Dressing/Undressing Upper body dressing/undressing steps patient completed: Thread/unthread right bra strap;Thread/unthread left bra strap;Hook/unhook bra;Thread/unthread right sleeve of pullover shirt/dresss;Put head through opening of pull over shirt/dress;Pull shirt over trunk;Thread/unthread left sleeve of pullover shirt/dress Upper body dressing/undressing: 4: Steadying assist FIM - Lower Body Dressing/Undressing Lower body dressing/undressing steps patient completed: Thread/unthread right underwear leg;Thread/unthread left underwear leg;Pull underwear up/down;Thread/unthread right pants leg;Thread/unthread left pants leg;Pull pants up/down;Don/Doff left sock;Don/Doff left shoe;Fasten/unfasten left shoe Lower body dressing/undressing: 4: Steadying Assist  FIM - Toileting Toileting steps completed by patient: Adjust clothing prior to toileting;Performs perineal hygiene;Adjust clothing after toileting Toileting Assistive Devices: Grab bar or rail for support Toileting: 4: Steadying assist  FIM - Radio producer Devices: Grab bars Toilet Transfers: 4-To toilet/BSC: Min A (steadying Pt. > 75%);4-From toilet/BSC: Min A (steadying Pt. > 75%)  FIM - Bed/Chair Transfer Bed/Chair Transfer Assistive Devices: Arm rests Bed/Chair Transfer: 5: Bed > Chair or W/C: Supervision (verbal cues/safety issues);5: Chair or W/C > Bed: Supervision (verbal cues/safety issues)  FIM - Locomotion: Wheelchair Distance: 200 Locomotion: Wheelchair: 0: Activity did not occur FIM - Locomotion: Ambulation Locomotion:  Ambulation Assistive Devices: Administrator Ambulation/Gait Assistance: 4: Min guard;5: Supervision Locomotion: Ambulation: 2: Luz Lex  73 - 149 ft with minimal assistance (Pt.>75%)  Comprehension Comprehension Mode: Auditory Comprehension: 5-Understands complex 90% of the time/Cues < 10% of the time  Expression Expression Mode: Verbal Expression: 5-Expresses basic 90% of the time/requires cueing < 10% of the time.  Social Interaction Social Interaction: 6-Interacts appropriately with others with medication or extra time (anti-anxiety, antidepressant).  Problem Solving Problem Solving: 4-Solves basic 75 - 89% of the time/requires cueing 10 - 24% of the time  Memory Memory: 4-Recognizes or recalls 75 - 89% of the time/requires cueing 10 - 24% of the time  Medical Problem List and Plan:  1. Functional deficits secondary to right AKA after multiple infections related to total knee replacement 09/17/2014  2. DVT Prophylaxis/Anticoagulation: Eliquis as directed.    3. Pain Management: Neurontin 300 mg 3 times a day, oxycodone as needed.   4. Mood/depression: Remeron 15 mg daily. Provide emotional support  5. Neuropsych: This patient is capable of making decisions on her own behalf.  6. Skin/Wound Care: Routine skin care after AKA   -stump sock preferred to shrinker 7. Fluids/Electrolytes/Nutrition: potassium normal today 8. Hypertrophic obstructive cardiomyopathy/PPM. Chest pain or shortness of breath continue Eliquis  9. ID. Continue vancomycin x2 weeks postoperatively per infectious disease for wound coverage   -afebrile. Wound looks great 10. Hypertension.  Toprol-XL 100 mg daily.  -bp's running low  -continue to hold lasix 20mg  daily for now  -may need to decrease bb also  LOS (Days) 6 A FACE TO FACE EVALUATION WAS PERFORMED  SWARTZ,ZACHARY T 09/29/2014 8:33 AM

## 2014-09-29 NOTE — Progress Notes (Signed)
Physical Therapy Session Note  Patient Details  Name: [name] MRN: [id number] Date of Birth: 14-Feb-1931  Today's Date: 09/29/2014  Short Term Goals: Week 1:  PT Short Term Goal 1 (Week 1): = LTGs due to LOS  Session #1: PT Individual Time: 0859-0959 PT Individual Time Calculation (min): 60 min  Premedicated for pain in RLE and shoulders. Pt reports she overdid it yesterday with walking and is sore in the shoulders. W/c mobility on unit mod I for general strengthening and endurance. practied various transfers in the ADL apartment including w/c <-> bed, furniture transfers, and toilet transfers at overall S level. Pt at times requires cues for removal of L legrest for safety, otherwise demonstrating good technique. Also addressed household gait with RW in apartment setting for home environment training including practicing going to closet and reaching for clothing requiring steady A to close S. In kitchen practiced obtaining items from various shelf levels in preparation for kitchen task tomorrow and discussed safety in using RW v w/c. Simulated car transfer with S due to requiring verbal cue for removal of leg rest. Finished with standing therex to RLE including hip flexion, abduction, and extension x 15 reps each x 2 sets.   Session #2: PT individual time: 1107-1137 (30 min) Denies pain at this time, just tired. Pt had gone into bathroom without A, reminded pt of safety recommendations at this time. Session focused on administering the Ampnopro to assess mobility and balance and discussed results with patient. As indicated by an AMPnoPRO score of 16/39 the patient is currently functioning at a K0-K1 level and has the ability to perform these tasks with ease: basic transfers, sitting balance, and standing balance with use of RW Patient currently has difficulty performing the following tasks: standing balance without UE support, gait, and stairs Therapy will continue to address these  impairments and functional limitations.  Based on the patient's K classification therapy is recommending at D/C that pt: primarily use w/c for mobility in the home and recommend close S/steady A for short distance gait with RW.   Therapy Documentation Precautions:  Precautions Precautions: Fall Restrictions Weight Bearing Restrictions: Yes RLE Weight Bearing: Non weight bearing   Locomotion : Ambulation Ambulation/Gait Assistance: 4: Min guard;5: Supervision   See FIM for current functional status  Therapy/Group: Individual Therapy  Canary Brim [facility] 09/29/2014, 11:45 AM

## 2014-09-29 NOTE — Progress Notes (Signed)
Occupational Therapy Session Note  Patient Details  Name: [name] MRN: [id number] Date of Birth: [birth year]  Today's Date: 09/29/2014 OT Individual Time: 3094-0768 OT Individual Time Calculation (min): 60 min    Short Term Goals: Week 1:  OT Short Term Goal 1 (Week 1): STG=LTG  Skilled Therapeutic Interventions/Progress Updates:  Pt seated in w/c finishing breakfast when arriving. Pt reporting pain in R residual limb, RN aware and medication had been administered prior to session. Pt self-propelled w/c to bathroom and performed stand pivot onto TTB, requiring v/c for removing footrest and armrest prior to beginning t/f. Pt doffed clothing while seated on TTB and performed UB/LB dressing, using LHS and grab bars. Pt performed squat pivot back to w/c and self-propelled w/c to room. Pt performed UB/LB dressing, requiring v/c for locking w/c brakes, removing footrest, and not standing without walker for stabilization when pulling up pants. Pt completed grooming tasks of brushing teeth and hair while seated in w/c at sink. Pt unable to remember homework assignments given on Friday and was unaware of safety issues w/ w/c. Pt also could not remember that she had been given written steps for w/c t/f by PT on Friday. Discussed and completed UE strengthening and ROM exercises, using Theraband and w/c push-ups. Pt remembered 1/4 exercises, all exercises written down for her to have as reminder. Pt seated in w/c w/ all needs nearby and RN in room when leaving. Due to issues w/ safety and memory, t/f goals, toileting goals, simple meal prep goals, and shower goals downgraded from mod I to supervision.  Therapy Documentation Precautions:  Precautions Precautions: Fall Restrictions Weight Bearing Restrictions: Yes RLE Weight Bearing: Non weight bearing  See FIM for current functional status  Therapy/Group: Individual Therapy  Aeden Matranga Raynell 09/29/2014, 12:09 PM

## 2014-09-29 NOTE — Progress Notes (Signed)
Physical Therapy Session Note  Patient Details  Name: [name] MRN: [id number] Date of Birth: 04-22-1931  Today's Date: 09/29/2014 PT Individual Time: 1275-1700 PT Individual Time Calculation (min): 30 min   Short Term Goals: Week 1:  PT Short Term Goal 1 (Week 1): = LTGs due to LOS  Skilled Therapeutic Interventions/Progress Updates:    Pt received supine in bed, agreeable to participate in therapy. Pt moved supine>sit w/; mod (I), transferred bed>w/c w/ supervision/setup. Pt propelled w/c to bathroom, performed SPT w/c<>toilet w/ use of grab bars and supervision, pt managed clothing and hygiene. Pt transported to hospital lobby in order to perform transfers to different surfaces including benches, chairs w/ arm rests and w/o, and sofa in ADL apartment. Pt consistently recalled to lock brakes before transfer, benefited from supervision 50% of the time to ensure w/c is safe distance from surface. Pt propelled w/c 150' back to room w/ mod (I), transferred back to bed w/ supervision. Pt left supine in bed w/ all needs within reach.   Therapy Documentation Precautions:  Precautions Precautions: Fall Restrictions Weight Bearing Restrictions: Yes RLE Weight Bearing: Non weight bearing General:   Vital Signs: Therapy Vitals Temp: 98.3 F (36.8 C) Temp Source: Oral Pulse Rate: 71 Resp: 18 BP: 102/48 mmHg Patient Position (if appropriate): Lying Oxygen Therapy SpO2: 96 % O2 Device: None (Room air) Pain: Pain Assessment Pain Assessment: 0-10 Pain Score: 5  Pain Type: Phantom pain Pain Location: Leg Pain Orientation: Right Pain Descriptors / Indicators: Aching Pain Frequency: Intermittent Pain Onset: On-going Pain Intervention(s): Medication (See eMAR) Multiple Pain Sites: No Mobility:   Locomotion :    Trunk/Postural Assessment :    Balance:   Exercises:   Other Treatments:    See FIM for current functional status  Therapy/Group: Individual  Therapy  Rada Hay Rada Hay, PT, DPT 09/29/2014, 7:55 AM

## 2014-09-30 ENCOUNTER — Inpatient Hospital Stay (HOSPITAL_COMMUNITY): Payer: Medicare Other

## 2014-09-30 ENCOUNTER — Inpatient Hospital Stay (HOSPITAL_COMMUNITY): Payer: Medicare Other | Admitting: Occupational Therapy

## 2014-09-30 ENCOUNTER — Encounter (HOSPITAL_COMMUNITY): Payer: Medicare Other | Admitting: Occupational Therapy

## 2014-09-30 NOTE — Progress Notes (Signed)
Physical Therapy Discharge Summary  Patient Details  Name: [name] MRN: [id number] Date of Birth: May 16, 1931  Today's Date: 09/30/2014 PT Individual Time: 1001-1101 PT Individual Time Calculation (min): 60 min    Patient has met 10 of 10 long term goals due to improved activity tolerance, improved balance, improved postural control, increased strength and ability to compensate for deficits.  Patient to discharge at a wheelchair level Supervision. Pt is mod (I) for w/c propulsion but benefits from supervision for transfers to ensure safe setup of wheelchair. Pt able to ambulate up to 30' w/ RW and supervision in home environment.  Patient's care partner is independent to provide the necessary supervision assistance at discharge.  Reasons goals not met: N/A  Recommendation:  Patient will benefit from ongoing skilled PT services in home health setting to continue to advance safe functional mobility, address ongoing impairments in standing balance, gait, LE strength, and minimize fall risk.  Equipment: 16x16 wheelchair  Reasons for discharge: treatment goals met and discharge from hospital  Patient/family agrees with progress made and goals achieved: Yes  PT Discharge Precautions/Restrictions Restrictions Weight Bearing Restrictions: Yes RLE Weight Bearing: Non weight bearing Vital Signs   Pain Pain Assessment Pain Assessment: 0-10 Pain Score: 3  Pain Intervention(s): Repositioned Vision/Perception     Cognition Orientation Level: Oriented X4 Sensation Sensation Light Touch: Appears Intact Motor  Motor Motor: Within Functional Limits  Mobility Bed Mobility Bed Mobility: Rolling Left;Supine to Sit;Rolling Right;Sit to Supine Rolling Right: 6: Modified independent (Device/Increase time) Rolling Left: 6: Modified independent (Device/Increase time) Supine to Sit: 6: Modified independent (Device/Increase time) Sit to Supine: 6: Modified independent (Device/Increase  time) Transfers Transfers: Yes Sit to Stand: 5: Supervision Sit to Stand Details: Verbal cues for safe use of DME/AE Stand to Sit: 5: Supervision Stand to Sit Details (indicate cue type and reason): Verbal cues for safe use of DME/AE Stand Pivot Transfers: 5: Supervision Stand Pivot Transfer Details: Verbal cues for safe use of DME/AE Locomotion  Ambulation Ambulation: Yes Ambulation/Gait Assistance: 5: Supervision Ambulation Distance (Feet): 25 Feet Assistive device: Rolling walker Ambulation/Gait Assistance Details: VC's for safe use of RW Gait Gait: Yes Gait Pattern: Impaired Gait Pattern: Trunk flexed (hop to gait) Gait velocity: decreased Wheelchair Mobility Wheelchair Mobility: Yes Wheelchair Assistance: 6: Modified independent (Device/Increase time) Environmental health practitioner: Both upper extremities Wheelchair Parts Management: Supervision/cueing Distance: 150  Trunk/Postural Assessment  Cervical Assessment Cervical Assessment: Within Functional Limits Thoracic Assessment Thoracic Assessment: Exceptions to [facility] Thoracic PROM Overall Thoracic PROM Comments: kyphosis Lumbar Assessment Lumbar Assessment: Exceptions to [facility] Lumbar AROM Overall Lumbar AROM Comments: sits in posterior pelvic tilt  Balance Balance Balance Assessed: Yes Static Sitting Balance Static Sitting - Level of Assistance: 6: Modified independent (Device/Increase time) Dynamic Sitting Balance Dynamic Sitting - Balance Support: During functional activity;Feet unsupported;Right upper extremity supported;Left upper extremity supported (Intermittent single UE support) Dynamic Sitting - Level of Assistance: 6: Modified independent (Device/Increase time) Static Standing Balance Static Standing - Balance Support: Bilateral upper extremity supported;During functional activity Static Standing - Level of Assistance: 5: Stand by assistance Dynamic Standing Balance Dynamic Standing - Balance Support: During  functional activity;Bilateral upper extremity supported Dynamic Standing - Level of Assistance: 5: Stand by assistance Dynamic Standing - Balance Activities: Lateral lean/weight shifting;Forward lean/weight shifting Extremity Assessment      RLE Assessment RLE Assessment: Exceptions to [facility] RLE Strength RLE Overall Strength Comments: Overall 3+/5 LLE Assessment LLE Assessment: Exceptions to [facility] LLE Strength LLE Overall Strength Comments: Overall 4+/5  See  FIM for current functional status  Rada Hay 09/30/2014, 11:23 AM

## 2014-09-30 NOTE — Progress Notes (Signed)
Notes reviewed and accurately reflect treatment sessions.

## 2014-09-30 NOTE — Progress Notes (Signed)
Social Work Patient ID: [name], female   DOB: 01-10-1931, 80 y.o.   MRN: [id number]  Pt and husband aware and agreeable with targeted d/c for tomorrow.  Family education completed today. Arranging [place] follow up and have ordered DME.  No concerns.  [name], LCSW

## 2014-09-30 NOTE — Progress Notes (Signed)
ID: 80 yo female with right knee wound infection s/p AKA at [facility] has been on vancomycin. Today is day 13 of vancomycin therapy. Patient had been on vancomycin 750mg  iv q24h at [facility] and last dose was at [place] on 09/23/14. Per [facility]'s note, will continue vancomycin for a total of 14 days (end date 10/01/14) unless her culture from mid-thigh incision turn positive, then will need to treat x 6 weeks. Coag negative staph in culture still pending at [facility].   Remains afebrile with WBC that are WNL - per PA, will stop ABX tom  Goal of Therapy:  Vancomycin trough level 15-20 mcg/ml  10/08: VT > 16.2 10/10: VT > 16.4  Renal: SCr up 0.98> 1.47, Trough unchanged despite bump but need to recheck in a few days if ongoing changes; yesterday's SCr down to 1.26  Plan:  - continue vancomycin 750mg  iv q24h - need to f/u to determine if 10/14 is still target LOT date (Plan is to d/c on 10/14). No repeat lvl since SCr down

## 2014-09-30 NOTE — Discharge Summary (Signed)
[name], [name]                 ACCOUNT NO.:  [id number]  MEDICAL RECORD NO.:  [id number]  LOCATION:  4W22C                        FACILITY:  [facility]  PHYSICIAN:  Meredith Staggers, M.D.DATE OF BIRTH:  05/18/1931  DATE OF ADMISSION:  09/23/2014 DATE OF DISCHARGE:  10/01/2014                              DISCHARGE SUMMARY   DISCHARGE DIAGNOSES: 1. Functional deficits secondary to her right above-knee amputation     after multiple infections related to total knee replacement,     September 17, 2014. 2. Pain management. 3. Hypertrophic obstructive cardiomyopathy with permanent pacemaker. 4. Hypertension.  HISTORY OF PRESENT ILLNESS:  This is an 80 year old right-handed female, medical history of hypertrophic obstructive cardiomyopathy with pacemaker, complete heart block with mitral regurgitation and a right total knee replacement in 1985 with multiple infections and revisions. Admitted to [facility] for elective right above-knee amputation, September 17, 2014, per Dr. Oletta Lamas, postoperative pain management.  Infectious Disease consulted.  Plan for 2-week course of antibiotic therapy with vancomycin per Infectious Disease.  Minimum-to- moderate assist for functional mobility.  The patient was admitted for comprehensive rehab program.  PAST MEDICAL HISTORY:  See discharge diagnoses.  SOCIAL HISTORY:  Married.  Retired.  Husband can assist.  FUNCTIONAL HISTORY:  Prior to admission, independent all areas of activities of daily living.  She used a walker.  FUNCTIONAL STATUS:  Upon admission to rehab services was min assist for sit to stand, min assist for stand to sit, minimal assist stand pivot transfers  and min mod assist activities daily living.  PHYSICAL EXAMINATION:  VITAL SIGNS:  Blood pressure 128/70, pulse 80, respirations 18, temperature 99.  This was an alert female and oriented x3. LUNGS:  Clear to auscultation. CARDIAC:  Regular rate and rhythm. ABDOMEN:   Soft and nontender.  Good bowel sounds.  Staples intact to AKA site with small amount of serosanguineous drainage.  No odor.  REHABILITATION HOSPITAL COURSE:  The patient was admitted to Inpatient Rehab Services with therapies initiated on a 3-hour daily basis consisting of physical therapy, occupational therapy, and rehabilitation nursing.  The following issues were addressed during the patient's rehabilitation stay.  Pertaining to [name], right AKA after multiple infections related to a total knee replacement remained stable. Surgical site healing nicely.  She would follow up with Dr. Oletta Lamas at [facility], [phone].  She continued with Eliquis for history of hypertrophic obstructive cardiomyopathy.  She had a pacemaker.  No chest pain or shortness of breath.  Pain management with the use of Flexeril at night time, Neurontin 300 mg 3 times daily, as well as oxycodone as needed for breakthrough pain.  She remained on vancomycin 2 weeks postoperatively per Infectious Disease, which was completed on October 01, 2014 and would follow up with Dr. Tillman Sers, [phone].  She remained afebrile throughout her course.  She did have a history of depression.  She continued on Remeron.  With emotional support provided, she was attending full therapies.  Her blood pressures remained well controlled on Toprol.  She had some mild orthostasis, her Lasix was held for a short time.  The patient received weekly collaborative interdisciplinary team conferences to discuss  estimated length of stay, family teaching, and any barriers to discharge. Wheelchair mobility was modified independent.  Transfers wheelchair to bed, furniture transfers, and toilet transfers overall supervision.  She did require some cues to manage her leg rest to the wheelchair.  Also addressed household ambulation with a rolling walker for home environments with steady assistance and a close supervision,  activities of daily living.  She self propelled her wheelchair to the bathroom and performed stand pivot transfers.  She was able to doff clothing while seated, performed upper and lower body dressing, requiring verbal cues for locking her wheelchair brakes at times.  Full family teaching was completed with her husband.  Plan was for home health physical and occupational therapy.  DISCHARGE MEDICATIONS:  Eliquis 2.5 mg p.o. b.i.d.; Tums 1 tablet p.o. t.i.d.; Flexeril 5 mg p.o. at bedtime; Restasis ophthalmic solution 0.05%, 1 drop both eyes twice daily; Flonase, 1 spray each nostril daily; Neurontin 300 mg p.o. t.i.d.; Toprol-XL 25 mg p.o. daily; Remeron 15 mg p.o. at bedtime; multivitamin daily; oxycodone immediate release 10 mg p.o. every 4 hours as needed severe pain; Lasix 20 mg p.o. daily; and potassium chloride 20 mEq p.o. daily.  DIET:  Regular.  SPECIAL INSTRUCTIONS:  The patient would follow up with Dr. Meredith Staggers, at the outpatient rehab center November 05, 2014.  Follow up Dr. Jani Gravel, management appointment to be made.  Follow up Dr. Oletta Lamas at [facility], [phone] in regard to recent above-knee amputation and removal of staples.  Follow up Infectious Disease at [facility], Dr. Tillman Sers, [phone].  Home Health, physical, and occupational therapy had been arranged.     Lauraine Rinne, P.A.   ______________________________ Meredith Staggers, M.D.    DA/MEDQ  D:  09/30/2014  T:  09/30/2014  Job:  [id number]  cc:   Arlyss Repress, MD

## 2014-09-30 NOTE — Discharge Summary (Signed)
Discharge summary job [id number]

## 2014-09-30 NOTE — Patient Care Conference (Signed)
Inpatient RehabilitationTeam Conference and Plan of Care Update Date: 09/30/2014   Time: 2:00 PM    Patient Name: [name]      Medical Record Number: [id number]  Date of Birth: 1931-10-19 Sex: Female         Room/Bed: 4W22C/4W22C-01 Payor Info: Payor: MEDICARE / Plan: MEDICARE PART A AND B / Product Type: *No Product type* /    Admitting Diagnosis: r aka  Admit Date/Time:  09/23/2014  2:11 PM Admission Comments: No comment available   Primary Diagnosis:  <principal problem not specified> Principal Problem: <principal problem not specified>  Patient Active Problem List   Diagnosis Date Noted  . Unilateral AKA 09/23/2014  . Preop cardiovascular exam 01/16/2014  . Atrioventricular block, complete 07/03/2013  . Atrial fibrillation 07/03/2013  . Breast cancer, left breast 04/24/2013  . Other symptoms involving cardiovascular system 12/22/2010  . PACEMAKER, PERMANENT 06/21/2010  . MITRAL REGURGITATION 06/17/2010  . HYPERTENSION 06/17/2010  . HOCM (hypertrophic obstructive cardiomyopathy) 06/17/2010  . HEART BLOCK 06/17/2010  . HEMORRHOIDS 06/17/2010  . BRONCHITIS 06/17/2010  . ASTHMA 06/17/2010  . CYSTITIS 06/17/2010  . CHEST PAIN 06/17/2010  . URINARY INCONTINENCE 06/17/2010    Expected Discharge Date: Expected Discharge Date: 10/01/14  Team Members Present: Physician leading conference: Dr. Alger Simons Social Worker Present: Lennart Pall, LCSW Nurse Present: Elliot Cousin, RN PT Present: Raylene Everts, PT;Jess Anastasia Fiedler, PT OT Present: Salome Spotted, OT;Patricia Lissa Hoard, OT PPS Coordinator present : Daiva Nakayama, RN, CRRN     Current Status/Progress Goal Weekly Team Focus  Medical   right aka after infected right tka.   stabilize medically for dc  wound care, pain control, pros ed   Bowel/Bladder   Continent of bowel and bladder  Min A  Remain continent of bowel and bladder   Swallow/Nutrition/ Hydration             ADL's   S for w/c level ADLs, requiring min-mod  v/c for safety w/ w/c and t/f  S for all ADLs except grooming at mod I; downgraded to S due to safety awareness  Safety awareness, family education, w/c set-up, UE strength and ROM, short term memory and use of memory aids, dynamic standing balance   Mobility   S w/c level; Steady A with gait short distances  S overall; d/c stair goal due to pt accessing home using ramp  safety, gait training dynamic standing balance, and fam ed   Communication             Safety/Cognition/ Behavioral Observations            Pain   10mg  Oxy IR q 4hr PRN  <2 on a 0-10 scale  Assess pain q 4hr and reassess after each PRN intervention   Skin   Staples to R AKA  No new skin infection/breakdown while on rehab  assess skin q shift    Rehab Goals Patient on target to meet rehab goals: Yes *See Care Plan and progress notes for long and short-term goals.  Barriers to Discharge: safety, memory    Possible Resolutions to Barriers:  supervision goals, family ed    Discharge Planning/Teaching Needs:  home with husband and family providing 24/7 assist      Team Discussion:  Pain under control.  Making excellent gains and reaching supervision overall.  Family education completed.  Last dose of abx tomorrow prior to d/c.  Revisions to Treatment Plan:  None   Continued Need for Acute Rehabilitation Level of Care:  The patient requires daily medical management by a physician with specialized training in physical medicine and rehabilitation for the following conditions: Daily direction of a multidisciplinary physical rehabilitation program to ensure safe treatment while eliciting the highest outcome that is of practical value to the patient.: Yes Daily medical management of patient stability for increased activity during participation in an intensive rehabilitation regime.: Yes Daily analysis of laboratory values and/or radiology reports with any subsequent need for medication adjustment of medical intervention for  : Post surgical problems;Other  Shahan Starks 09/30/2014, 2:33 PM

## 2014-09-30 NOTE — Progress Notes (Signed)
Discharge note reviewed and accurately reflects discharge summary.  

## 2014-09-30 NOTE — Progress Notes (Signed)
Occupational Therapy Session Note  Patient Details  Name: [name] MRN: [id number] Date of Birth: 05/10/1931  Today's Date: 09/30/2014  Short Term Goals: Week 1:  OT Short Term Goal 1 (Week 1): STG=LTG  Skilled Therapeutic Interventions/Progress Updates:   Session 1: OT Individual Time: 1100-1200 OT Individual Time Calculation (min): 60 min  Pt seated in w/c w/ husband in room when arriving, reporting 2/10 pain. Husband educated on pt safety w/ w/c use, while bathing and dressing, and in home management. Pt doffed clothing while seated in w/c, w/ 1 sit<>stand t/f. Pt self-propelled w/c to bathroom, completed t/f to TTB, and completed UB/LB bathing w/ 1 sit<>stand t/f. Pt t/f to w/c and self-propelled w/c to room, completed UB/LB dressing. Husband donned AKA sleeve and provided supervision and v/c when needed during dressing. Pt self-propelled w/c to bathroom and performed toilet t/f w/ husband supervision. Pt and husband educated on and pt demonstrated 4 exercises as part of UE strength and ROM HEP. Pt seated in w/c w/ husband in room and all needs nearby when leaving.  Session 2: OT Individual Time: 9937-1696 OT Individual Time Calculation (min): 90 min  Pt seated in w/c when arriving, no reports of pain. Pt brought to apartment kitchen to complete meal preparation task of baking a cake. Pt gathered materials, sequenced steps to prepare and bake cake and frosting, and put away items and cleaned dishes when cake was prepared. Pt completed multiple sit<>stand t/f and worked on dynamic sitting balance, UE ROM and strength, safety w/ w/c use, short term memory, safety in kitchen, and problem solving w/ reaching high and low objects. Pt required min v/c for positioning w/c and locking breaks when reaching for objects in kitchen. Pt self-propelled to room and seated in w/c w/ all needs nearby when leaving.  Therapy Documentation Precautions:  Precautions Precautions:  Fall Restrictions Weight Bearing Restrictions: Yes RLE Weight Bearing: Non weight bearing  See FIM for current functional status  Therapy/Group: Individual Therapy  Tobin Cadiente Raynell 09/30/2014, 12:28 PM

## 2014-09-30 NOTE — Progress Notes (Signed)
Social Work Lennart Pall, [employer] Social Worker Signed  Patient Care Conference Service date: 09/30/2014 2:33 PM  Inpatient RehabilitationTeam Conference and Plan of Care Update Date: 09/30/2014   Time: 2:00 PM     Patient Name: [name]       Medical Record Number: [id number]   Date of Birth: 07-15-1931 Sex: Female         Room/Bed: 4W22C/4W22C-01 Payor Info: Payor: MEDICARE / Plan: MEDICARE PART A AND B / Product Type: *No Product type* /   Admitting Diagnosis: r aka   Admit Date/Time:  09/23/2014  2:11 PM Admission Comments: No comment available   Primary Diagnosis:  <principal problem not specified> Principal Problem: <principal problem not specified>    Patient Active Problem List     Diagnosis  Date Noted   .  Unilateral AKA  09/23/2014   .  Preop cardiovascular exam  01/16/2014   .  Atrioventricular block, complete  07/03/2013   .  Atrial fibrillation  07/03/2013   .  Breast cancer, left breast  04/24/2013   .  Other symptoms involving cardiovascular system  12/22/2010   .  PACEMAKER, PERMANENT  06/21/2010   .  MITRAL REGURGITATION  06/17/2010   .  HYPERTENSION  06/17/2010   .  HOCM (hypertrophic obstructive cardiomyopathy)  06/17/2010   .  HEART BLOCK  06/17/2010   .  HEMORRHOIDS  06/17/2010   .  BRONCHITIS  06/17/2010   .  ASTHMA  06/17/2010   .  CYSTITIS  06/17/2010   .  CHEST PAIN  06/17/2010   .  URINARY INCONTINENCE  06/17/2010     Expected Discharge Date: Expected Discharge Date: 10/01/14  Team Members Present: Physician leading conference: Dr. Alger Simons Social Worker Present: Lennart Pall, LCSW Nurse Present: Elliot Cousin, RN PT Present: Raylene Everts, PT;Jess Anastasia Fiedler, PT OT Present: Salome Spotted, OT;Patricia Lissa Hoard, OT PPS Coordinator present : Daiva Nakayama, RN, CRRN        Current Status/Progress  Goal  Weekly Team Focus   Medical     right aka after infected right tka.   stabilize medically for dc  wound care, pain control, pros ed    Bowel/Bladder     Continent of bowel and bladder  Min A  Remain continent of bowel and bladder   Swallow/Nutrition/ Hydration            ADL's     S for w/c level ADLs, requiring min-mod v/c for safety w/ w/c and t/f  S for all ADLs except grooming at mod I; downgraded to S due to safety awareness  Safety awareness, family education, w/c set-up, UE strength and ROM, short term memory and use of memory aids, dynamic standing balance   Mobility     S w/c level; Steady A with gait short distances  S overall; d/c stair goal due to pt accessing home using ramp  safety, gait training dynamic standing balance, and fam ed   Communication            Safety/Cognition/ Behavioral Observations           Pain     10mg  Oxy IR q 4hr PRN  <2 on a 0-10 scale  Assess pain q 4hr and reassess after each PRN intervention   Skin     Staples to R AKA  No new skin infection/breakdown while on rehab  assess skin q shift    Rehab Goals Patient on target to meet rehab goals: Yes *  See Care Plan and progress notes for long and short-term goals.    Barriers to Discharge:  safety, memory      Possible Resolutions to Barriers:    supervision goals, family ed      Discharge Planning/Teaching Needs:    home with husband and family providing 24/7 assist      Team Discussion:    Pain under control.  Making excellent gains and reaching supervision overall.  Family education completed.  Last dose of abx tomorrow prior to d/c.   Revisions to Treatment Plan:    None    Continued Need for Acute Rehabilitation Level of Care: The patient requires daily medical management by a physician with specialized training in physical medicine and rehabilitation for the following conditions: Daily direction of a multidisciplinary physical rehabilitation program to ensure safe treatment while eliciting the highest outcome that is of practical value to the patient.: Yes Daily medical management of patient stability for  increased activity during participation in an intensive rehabilitation regime.: Yes Daily analysis of laboratory values and/or radiology reports with any subsequent need for medication adjustment of medical intervention for : Post surgical problems;Marda Stalker 09/30/2014, 2:33 PM          Patient ID: [name], female   DOB: [birth year], 80 y.o.   MRN: [id number]

## 2014-09-30 NOTE — Progress Notes (Signed)
Occupational Therapy Discharge Summary  Patient Details  Name: [name] MRN: [id number] Date of Birth: 02/22/1931  Today's Date: 09/30/2014  Patient has met 11 of 11 long term goals due to improved activity tolerance, improved balance, postural control, ability to compensate for deficits and improved awareness.  Patient to discharge at overall Supervision level.  Patient's care partner is independent to provide the necessary cognitive assistance/supervision at discharge.    Reasons goals not met: N/a  Recommendation:  Patient will benefit from ongoing skilled OT services in home health setting to continue to advance functional skills in the area of BADL and iADL.  Equipment: No equipment provided; TTB and 3-in-1 available at home  Reasons for discharge: treatment goals met and discharge from hospital  Patient/family agrees with progress made and goals achieved: Yes  OT Discharge Precautions/Restrictions  Precautions Precautions: Fall Restrictions Weight Bearing Restrictions: Yes RLE Weight Bearing: Non weight bearing  ADL See FIM  Vision/Perception  Vision- History Baseline Vision/History: Wears glasses;Macular Degeneration Wears Glasses: At all times Patient Visual Report: No change from baseline   Cognition Overall Cognitive Status: Within Functional Limits for tasks assessed Arousal/Alertness: Awake/alert Orientation Level: Oriented X4 Safety/Judgment: Impaired  Sensation Sensation Light Touch: Appears Intact  Motor  Motor Motor: Within Functional Limits Motor - Skilled Clinical Observations: Tremors noted in BUE  Mobility  Bed Mobility Bed Mobility: Rolling Left;Supine to Sit;Rolling Right;Sit to Supine Rolling Right: 6: Modified independent (Device/Increase time) Rolling Left: 6: Modified independent (Device/Increase time) Supine to Sit: 6: Modified independent (Device/Increase time) Sit to Supine: 6: Modified independent (Device/Increase  time) Transfers Transfers: Sit to Stand;Stand to Sit Sit to Stand: 5: Supervision Sit to Stand Details: Verbal cues for safe use of DME/AE Stand to Sit: 5: Supervision Stand to Sit Details (indicate cue type and reason): Verbal cues for safe use of DME/AE   Trunk/Postural Assessment  Cervical Assessment Cervical Assessment: Within Functional Limits Thoracic Assessment Thoracic Assessment: Exceptions to [facility] Thoracic PROM Overall Thoracic PROM Comments: kyphosis Lumbar Assessment Lumbar Assessment: Exceptions to [facility] Lumbar AROM Overall Lumbar AROM Comments: sits in posterior pelvic tilt   Balance Balance Balance Assessed: Yes Static Sitting Balance Static Sitting - Level of Assistance: 6: Modified independent (Device/Increase time) Dynamic Sitting Balance Dynamic Sitting - Balance Support: During functional activity;Feet unsupported;Right upper extremity supported;Left upper extremity supported (Intermittent single UE support) Dynamic Sitting - Level of Assistance: 6: Modified independent (Device/Increase time) Static Standing Balance Static Standing - Balance Support: Bilateral upper extremity supported;During functional activity Static Standing - Level of Assistance: 5: Stand by assistance Dynamic Standing Balance Dynamic Standing - Balance Support: During functional activity;Bilateral upper extremity supported Dynamic Standing - Level of Assistance: 5: Stand by assistance Dynamic Standing - Balance Activities: Lateral lean/weight shifting;Forward lean/weight shifting  Extremity/Trunk Assessment RUE Assessment RUE Assessment: Within Functional Limits LUE Assessment LUE Assessment: Within Functional Limits  See FIM for current functional status  [name] 09/30/2014, 2:53 PM

## 2014-09-30 NOTE — Progress Notes (Signed)
[place] PHYSICAL MEDICINE & REHABILITATION     PROGRESS NOTE    Subjective/Complaints: Leg pain fairly well controlled. Had a good weekend  Objective: Vital Signs: Blood pressure 120/60, pulse 97, temperature 98.3 F (36.8 C), temperature source Oral, resp. rate 18, height 5' (1.524 m), weight 50.667 kg (111 lb 11.2 oz), SpO2 97.00%. No results found. No results found for this basename: WBC, HGB, HCT, PLT,  in the last 72 hours  Recent Labs  09/29/14 1138  CREATININE 1.26*   CBG (last 3)  No results found for this basename: GLUCAP,  in the last 72 hours  Wt Readings from Last 3 Encounters:  09/23/14 50.667 kg (111 lb 11.2 oz)  09/22/14 59.875 kg (132 lb)  08/27/14 55.792 kg (123 lb)    Physical Exam:  Constitutional: She is oriented to person, place, and time.   HENT: oral mucosa pink and moist  Head: Normocephalic.  Eyes: EOM are normal.  Neck: Normal range of motion. Neck supple. No thyromegaly present.  Cardiovascular:  Cardiac rate controlled. No murmurs rubs or gallops  Respiratory: Effort normal and breath sounds normal. No respiratory distress.  GI: Soft. Bowel sounds are normal. She exhibits no distension.  Neurological: She is alert and oriented to person, place, and time. RUE: deltoid 5/5, bicep 5/5, tricep 5/5, wrist ext 5/5, hand intrinsics 5/5  LUE: deltoid 5/5, bicep 5/5, triceps 5/5, wrist ext 5/5, hand instrincs 5/5  LLE: 4/5 proximal to distal. RLE: HF 3+/5  Skin:  Staples intact with minmal drainage from medial aspect of wound.    Psychiatric: just waking up. Fairly alert once woken.   Assessment/Plan: 1. Functional deficits secondary to right AK (infected right TKA) which require 3+ hours per day of interdisciplinary therapy in a comprehensive inpatient rehab setting. Physiatrist is providing close team supervision and 24 hour management of active medical problems listed below. Physiatrist and rehab team continue to assess barriers to  discharge/monitor patient progress toward functional and medical goals.  FIM: FIM - Bathing Bathing Steps Patient Completed: Chest;Right Arm;Left Arm;Abdomen;Front perineal area;Buttocks;Right upper leg;Left upper leg;Left lower leg (including foot) Bathing: 5: Supervision: Safety issues/verbal cues  FIM - Upper Body Dressing/Undressing Upper body dressing/undressing steps patient completed: Thread/unthread right bra strap;Thread/unthread left bra strap;Hook/unhook bra;Thread/unthread right sleeve of pullover shirt/dresss;Put head through opening of pull over shirt/dress;Pull shirt over trunk;Thread/unthread left sleeve of pullover shirt/dress Upper body dressing/undressing: 5: Supervision: Safety issues/verbal cues FIM - Lower Body Dressing/Undressing Lower body dressing/undressing steps patient completed: Thread/unthread right underwear leg;Thread/unthread left underwear leg;Pull underwear up/down;Thread/unthread right pants leg;Thread/unthread left pants leg;Pull pants up/down;Don/Doff left sock;Don/Doff left shoe;Fasten/unfasten left shoe Lower body dressing/undressing: 4: Steadying Assist  FIM - Toileting Toileting steps completed by patient: Adjust clothing prior to toileting;Performs perineal hygiene;Adjust clothing after toileting Toileting Assistive Devices: Grab bar or rail for support Toileting: 0: Activity did not occur  FIM - Radio producer Devices: Grab bars Toilet Transfers: 0-Activity did not occur  FIM - Control and instrumentation engineer Devices: Arm rests Bed/Chair Transfer: 6: Sit > Supine: No assist;6: Supine > Sit: No assist;5: Bed > Chair or W/C: Supervision (verbal cues/safety issues);5: Chair or W/C > Bed: Supervision (verbal cues/safety issues)  FIM - Locomotion: Wheelchair Distance: 200 Locomotion: Wheelchair: 6: Travels 150 ft or more, turns around, maneuvers to table, bed or toilet, negotiates 3% grade: maneuvers on  rugs and over door sills independently FIM - Locomotion: Ambulation Locomotion: Ambulation Assistive Devices: Administrator Ambulation/Gait Assistance: 4: Min  guard;5: Supervision Locomotion: Ambulation: 0: Activity did not occur  Comprehension Comprehension Mode: Auditory Comprehension: 5-Understands complex 90% of the time/Cues < 10% of the time  Expression Expression Mode: Verbal Expression: 5-Expresses basic 90% of the time/requires cueing < 10% of the time.  Social Interaction Social Interaction: 6-Interacts appropriately with others with medication or extra time (anti-anxiety, antidepressant).  Problem Solving Problem Solving: 4-Solves basic 75 - 89% of the time/requires cueing 10 - 24% of the time  Memory Memory: 4-Recognizes or recalls 75 - 89% of the time/requires cueing 10 - 24% of the time  Medical Problem List and Plan:  1. Functional deficits secondary to right AKA after multiple infections related to total knee replacement 09/17/2014  2. DVT Prophylaxis/Anticoagulation: Eliquis as directed.    3. Pain Management: Neurontin 300 mg 3 times a day, oxycodone as needed.   4. Mood/depression: Remeron 15 mg daily. Provide emotional support  5. Neuropsych: This patient is capable of making decisions on her own behalf.  6. Skin/Wound Care: Routine skin care after AKA   -stump sock for now---transition to shrinker eventually 7. Fluids/Electrolytes/Nutrition: potassium normal today 8. Hypertrophic obstructive cardiomyopathy/PPM. Chest pain or shortness of breath continue Eliquis  9. ID. Continue vancomycin x2 weeks postoperatively (through 10/14) per infectious disease for wound coverage   -afebrile. Wound looks great 10. Hypertension.  Toprol-XL 100 mg daily.  -bp's are a little better now (higher)  -continue to hold lasix 20mg  daily for now  -may need to decrease bb also  LOS (Days) 7 A FACE TO FACE EVALUATION WAS PERFORMED  Jamin Panther T 09/30/2014 8:35 AM

## 2014-10-01 DIAGNOSIS — I482 Chronic atrial fibrillation: Secondary | ICD-10-CM

## 2014-10-01 DIAGNOSIS — I421 Obstructive hypertrophic cardiomyopathy: Secondary | ICD-10-CM

## 2014-10-01 DIAGNOSIS — Z89611 Acquired absence of right leg above knee: Secondary | ICD-10-CM

## 2014-10-01 MED ORDER — CALCIUM CITRATE-VITAMIN D 315-200 MG-UNIT PO TABS: 1.0000 | ORAL_TABLET | Freq: Two times a day (BID) | ORAL | Status: DC

## 2014-10-01 MED ORDER — METOPROLOL SUCCINATE ER 25 MG PO TB24: 25.0000 mg | ORAL_TABLET | Freq: Every day | ORAL | Status: DC

## 2014-10-01 MED ORDER — CYCLOBENZAPRINE HCL 5 MG PO TABS: 5.0000 mg | ORAL_TABLET | Freq: Every day | ORAL | Status: DC

## 2014-10-01 MED ORDER — MIRTAZAPINE 15 MG PO TABS: 15.0000 mg | ORAL_TABLET | Freq: Every day | ORAL | Status: DC

## 2014-10-01 MED ORDER — GABAPENTIN 300 MG PO CAPS: 300.0000 mg | ORAL_CAPSULE | Freq: Three times a day (TID) | ORAL | Status: DC

## 2014-10-01 MED ORDER — POTASSIUM CHLORIDE CRYS ER 20 MEQ PO TBCR: 20.0000 meq | EXTENDED_RELEASE_TABLET | Freq: Two times a day (BID) | ORAL | Status: DC

## 2014-10-01 MED ORDER — OXYCODONE HCL 10 MG PO TABS: 10.0000 mg | ORAL_TABLET | ORAL | Status: DC | PRN

## 2014-10-01 MED ORDER — APIXABAN 2.5 MG PO TABS: 2.5000 mg | ORAL_TABLET | Freq: Two times a day (BID) | ORAL | Status: DC

## 2014-10-01 MED ORDER — FUROSEMIDE 20 MG PO TABS: 20.0000 mg | ORAL_TABLET | Freq: Two times a day (BID) | ORAL | Status: DC

## 2014-10-01 MED ORDER — CYCLOSPORINE 0.05 % OP EMUL: 1.0000 [drp] | Freq: Two times a day (BID) | OPHTHALMIC | Status: DC

## 2014-10-01 NOTE — Progress Notes (Signed)
Social Work  Discharge Note  The overall goal for the admission was met for:   Discharge location: Yes - home with husband to provide any assistance needed  Length of Stay: Yes - 8 days  Discharge activity level: Yes - supervision goals overall  Home/community participation: Yes  Services provided included: MD, RD, PT, OT, RN, TR, Pharmacy and [place]: Medicare and Private Insurance: [employer]  Follow-up services arranged: Home Health: RN, PT, OT via [facility], DME: 16x16 lightweight w/c, cushion via [place] and Patient/Family has no preference for HH/DME agencies  Comments (or additional information):  Patient/Family verbalized understanding of follow-up arrangements: Yes  Individual responsible for coordination of the follow-up plan: patient  Confirmed correct DME delivered: Jason Frisbee 10/01/2014    [place], [place]

## 2014-10-01 NOTE — Progress Notes (Addendum)
[place] PHYSICAL MEDICINE & REHABILITATION     PROGRESS NOTE    Subjective/Complaints: "I am going home after 3pm"  Objective: Vital Signs: Blood pressure 98/49, pulse 62, temperature 99.1 F (37.3 C), temperature source Oral, resp. rate 18, height 5' (1.524 m), weight 50.667 kg (111 lb 11.2 oz), SpO2 95.00%. No results found. No results found for this basename: WBC, HGB, HCT, PLT,  in the last 72 hours  Recent Labs  09/29/14 1138  CREATININE 1.26*   CBG (last 3)  No results found for this basename: GLUCAP,  in the last 72 hours  Wt Readings from Last 3 Encounters:  09/23/14 50.667 kg (111 lb 11.2 oz)  09/22/14 59.875 kg (132 lb)  08/27/14 55.792 kg (123 lb)    Physical Exam:  Constitutional: She is oriented to person, place, and time.   HENT: oral mucosa pink and moist  Head: Normocephalic.  Eyes: EOM are normal.  Neck: Normal range of motion. Neck supple. No thyromegaly present.  Cardiovascular:  Cardiac rate controlled. No murmurs rubs or gallops  Respiratory: Effort normal and breath sounds normal. No respiratory distress.  GI: Soft. Bowel sounds are normal. She exhibits no distension.  Neurological: She is alert and oriented to person, place, and time. RUE: deltoid 5/5, bicep 5/5, tricep 5/5, wrist ext 5/5, hand intrinsics 5/5  LUE: deltoid 5/5, bicep 5/5, triceps 5/5, wrist ext 5/5, hand instrincs 5/5  LLE: 4/5 proximal to distal. RLE: HF 3+/5  Skin:  Staples intact without drainage from medial aspect of wound.    Psychiatric: just waking up. Fairly alert once woken.   Assessment/Plan: 1. Functional deficits secondary to right AK (infected right TKA) Stable for D/C today F/u PCP in 1-2 weeks F/u PM&R 3 weeks See D/C summary See D/C instructions FIM: FIM - Bathing Bathing Steps Patient Completed: Chest;Right Arm;Left Arm;Abdomen;Front perineal area;Buttocks;Right upper leg;Left upper leg;Left lower leg (including foot) Bathing: 5: Supervision: Safety  issues/verbal cues  FIM - Upper Body Dressing/Undressing Upper body dressing/undressing steps patient completed: Thread/unthread right bra strap;Thread/unthread left bra strap;Hook/unhook bra;Thread/unthread right sleeve of pullover shirt/dresss;Put head through opening of pull over shirt/dress;Pull shirt over trunk;Thread/unthread left sleeve of pullover shirt/dress Upper body dressing/undressing: 5: Supervision: Safety issues/verbal cues FIM - Lower Body Dressing/Undressing Lower body dressing/undressing steps patient completed: Thread/unthread right underwear leg;Thread/unthread left underwear leg;Pull underwear up/down;Thread/unthread right pants leg;Thread/unthread left pants leg;Pull pants up/down;Don/Doff left sock;Don/Doff left shoe;Fasten/unfasten left shoe Lower body dressing/undressing: 5: Supervision: Safety issues/verbal cues  FIM - Toileting Toileting steps completed by patient: Adjust clothing prior to toileting;Performs perineal hygiene;Adjust clothing after toileting Toileting Assistive Devices: Grab bar or rail for support Toileting: 5: Supervision: Safety issues/verbal cues  FIM - Radio producer Devices: Grab bars Toilet Transfers: 5-To toilet/BSC: Supervision (verbal cues/safety issues);5-From toilet/BSC: Supervision (verbal cues/safety issues)  FIM - Engineer, site Assistive Devices: Arm rests Bed/Chair Transfer: 0: Activity did not occur  FIM - Locomotion: Wheelchair Distance: 150 Locomotion: Wheelchair: 6: Travels 150 ft or more, turns around, maneuvers to table, bed or toilet, negotiates 3% grade: maneuvers on rugs and over door sills independently FIM - Locomotion: Ambulation Locomotion: Ambulation Assistive Devices: Administrator Ambulation/Gait Assistance: 5: Supervision Locomotion: Ambulation: 1: Travels less than 50 ft with supervision/safety issues  Comprehension Comprehension Mode:  Auditory Comprehension: 5-Understands complex 90% of the time/Cues < 10% of the time  Expression Expression Mode: Verbal Expression: 5-Expresses basic 90% of the time/requires cueing < 10% of the time.  Social Interaction  Social Interaction: 6-Interacts appropriately with others with medication or extra time (anti-anxiety, antidepressant).  Problem Solving Problem Solving: 4-Solves basic 75 - 89% of the time/requires cueing 10 - 24% of the time  Memory Memory: 4-Recognizes or recalls 75 - 89% of the time/requires cueing 10 - 24% of the time  Medical Problem List and Plan:  1. Functional deficits secondary to right AKA after multiple infections related to total knee replacement 09/17/2014  2. DVT Prophylaxis/Anticoagulation: Eliquis as directed.    3. Pain Management: Neurontin 300 mg 3 times a day, oxycodone as needed.   4. Mood/depression: Remeron 15 mg daily. Provide emotional support  5. Neuropsych: This patient is capable of making decisions on her own behalf.  6. Skin/Wound Care: Routine skin care after AKA   -stump sock for now---transition to shrinker eventually 7. Fluids/Electrolytes/Nutrition: potassium normal today 8. Hypertrophic obstructive cardiomyopathy/PPM. Chest pain or shortness of breath continue Eliquis  9. ID. Continue vancomycin x2 weeks postoperatively (through 10/14) per infectious disease for wound coverage   -afebrile. Wound looks great 10. Hypertension.  Toprol-XL 100 mg daily.  -bp's are a little better now (higher)  -continue to hold lasix 20mg  daily for now  -may need to decrease bb also  LOS (Days) 8 A FACE TO FACE EVALUATION WAS PERFORMED  Zahara Rembert E 10/01/2014 8:40 AM

## 2014-10-01 NOTE — Progress Notes (Signed)
Patient received discharge instructions from Marlowe Sh[place], PA-C with verbal understanding. Patient discharged to home with caregiver and belongings.

## 2014-10-02 DIAGNOSIS — I421 Obstructive hypertrophic cardiomyopathy: Secondary | ICD-10-CM | POA: Diagnosis not present

## 2014-10-02 DIAGNOSIS — D649 Anemia, unspecified: Secondary | ICD-10-CM | POA: Diagnosis not present

## 2014-10-02 DIAGNOSIS — I10 Essential (primary) hypertension: Secondary | ICD-10-CM | POA: Diagnosis not present

## 2014-10-02 DIAGNOSIS — J45909 Unspecified asthma, uncomplicated: Secondary | ICD-10-CM | POA: Diagnosis not present

## 2014-10-02 DIAGNOSIS — M150 Primary generalized (osteo)arthritis: Secondary | ICD-10-CM | POA: Diagnosis not present

## 2014-10-02 DIAGNOSIS — Z89611 Acquired absence of right leg above knee: Secondary | ICD-10-CM | POA: Diagnosis not present

## 2014-10-02 DIAGNOSIS — Z4781 Encounter for orthopedic aftercare following surgical amputation: Secondary | ICD-10-CM | POA: Diagnosis not present

## 2014-10-06 DIAGNOSIS — I421 Obstructive hypertrophic cardiomyopathy: Secondary | ICD-10-CM | POA: Diagnosis not present

## 2014-10-06 DIAGNOSIS — D649 Anemia, unspecified: Secondary | ICD-10-CM | POA: Diagnosis not present

## 2014-10-06 DIAGNOSIS — Z4781 Encounter for orthopedic aftercare following surgical amputation: Secondary | ICD-10-CM | POA: Diagnosis not present

## 2014-10-06 DIAGNOSIS — I10 Essential (primary) hypertension: Secondary | ICD-10-CM | POA: Diagnosis not present

## 2014-10-06 DIAGNOSIS — J45909 Unspecified asthma, uncomplicated: Secondary | ICD-10-CM | POA: Diagnosis not present

## 2014-10-06 DIAGNOSIS — M150 Primary generalized (osteo)arthritis: Secondary | ICD-10-CM | POA: Diagnosis not present

## 2014-10-07 DIAGNOSIS — N390 Urinary tract infection, site not specified: Secondary | ICD-10-CM | POA: Diagnosis not present

## 2014-10-07 DIAGNOSIS — R300 Dysuria: Secondary | ICD-10-CM | POA: Diagnosis not present

## 2014-10-08 DIAGNOSIS — Z4781 Encounter for orthopedic aftercare following surgical amputation: Secondary | ICD-10-CM | POA: Diagnosis not present

## 2014-10-08 DIAGNOSIS — D649 Anemia, unspecified: Secondary | ICD-10-CM | POA: Diagnosis not present

## 2014-10-08 DIAGNOSIS — M150 Primary generalized (osteo)arthritis: Secondary | ICD-10-CM | POA: Diagnosis not present

## 2014-10-08 DIAGNOSIS — I421 Obstructive hypertrophic cardiomyopathy: Secondary | ICD-10-CM | POA: Diagnosis not present

## 2014-10-08 DIAGNOSIS — J45909 Unspecified asthma, uncomplicated: Secondary | ICD-10-CM | POA: Diagnosis not present

## 2014-10-08 DIAGNOSIS — I10 Essential (primary) hypertension: Secondary | ICD-10-CM | POA: Diagnosis not present

## 2014-10-09 DIAGNOSIS — M150 Primary generalized (osteo)arthritis: Secondary | ICD-10-CM | POA: Diagnosis not present

## 2014-10-09 DIAGNOSIS — Z4781 Encounter for orthopedic aftercare following surgical amputation: Secondary | ICD-10-CM | POA: Diagnosis not present

## 2014-10-09 DIAGNOSIS — D649 Anemia, unspecified: Secondary | ICD-10-CM | POA: Diagnosis not present

## 2014-10-09 DIAGNOSIS — I421 Obstructive hypertrophic cardiomyopathy: Secondary | ICD-10-CM | POA: Diagnosis not present

## 2014-10-09 DIAGNOSIS — I10 Essential (primary) hypertension: Secondary | ICD-10-CM | POA: Diagnosis not present

## 2014-10-09 DIAGNOSIS — J45909 Unspecified asthma, uncomplicated: Secondary | ICD-10-CM | POA: Diagnosis not present

## 2014-10-13 DIAGNOSIS — J45909 Unspecified asthma, uncomplicated: Secondary | ICD-10-CM | POA: Diagnosis not present

## 2014-10-13 DIAGNOSIS — I421 Obstructive hypertrophic cardiomyopathy: Secondary | ICD-10-CM | POA: Diagnosis not present

## 2014-10-13 DIAGNOSIS — M150 Primary generalized (osteo)arthritis: Secondary | ICD-10-CM | POA: Diagnosis not present

## 2014-10-13 DIAGNOSIS — Z4781 Encounter for orthopedic aftercare following surgical amputation: Secondary | ICD-10-CM | POA: Diagnosis not present

## 2014-10-13 DIAGNOSIS — I10 Essential (primary) hypertension: Secondary | ICD-10-CM | POA: Diagnosis not present

## 2014-10-13 DIAGNOSIS — D649 Anemia, unspecified: Secondary | ICD-10-CM | POA: Diagnosis not present

## 2014-10-14 DIAGNOSIS — N390 Urinary tract infection, site not specified: Secondary | ICD-10-CM | POA: Diagnosis not present

## 2014-10-14 DIAGNOSIS — A499 Bacterial infection, unspecified: Secondary | ICD-10-CM | POA: Diagnosis not present

## 2014-10-14 DIAGNOSIS — Z89611 Acquired absence of right leg above knee: Secondary | ICD-10-CM | POA: Diagnosis not present

## 2014-10-14 DIAGNOSIS — T847XXD Infection and inflammatory reaction due to other internal orthopedic prosthetic devices, implants and grafts, subsequent encounter: Secondary | ICD-10-CM | POA: Diagnosis not present

## 2014-10-14 DIAGNOSIS — R5383 Other fatigue: Secondary | ICD-10-CM | POA: Diagnosis not present

## 2014-10-15 DIAGNOSIS — I421 Obstructive hypertrophic cardiomyopathy: Secondary | ICD-10-CM | POA: Diagnosis not present

## 2014-10-15 DIAGNOSIS — Z4781 Encounter for orthopedic aftercare following surgical amputation: Secondary | ICD-10-CM | POA: Diagnosis not present

## 2014-10-15 DIAGNOSIS — I10 Essential (primary) hypertension: Secondary | ICD-10-CM | POA: Diagnosis not present

## 2014-10-15 DIAGNOSIS — D649 Anemia, unspecified: Secondary | ICD-10-CM | POA: Diagnosis not present

## 2014-10-15 DIAGNOSIS — J45909 Unspecified asthma, uncomplicated: Secondary | ICD-10-CM | POA: Diagnosis not present

## 2014-10-15 DIAGNOSIS — M150 Primary generalized (osteo)arthritis: Secondary | ICD-10-CM | POA: Diagnosis not present

## 2014-10-16 DIAGNOSIS — Z4781 Encounter for orthopedic aftercare following surgical amputation: Secondary | ICD-10-CM | POA: Diagnosis not present

## 2014-10-16 DIAGNOSIS — M150 Primary generalized (osteo)arthritis: Secondary | ICD-10-CM | POA: Diagnosis not present

## 2014-10-16 DIAGNOSIS — J45909 Unspecified asthma, uncomplicated: Secondary | ICD-10-CM | POA: Diagnosis not present

## 2014-10-16 DIAGNOSIS — M810 Age-related osteoporosis without current pathological fracture: Secondary | ICD-10-CM | POA: Diagnosis not present

## 2014-10-16 DIAGNOSIS — D649 Anemia, unspecified: Secondary | ICD-10-CM | POA: Diagnosis not present

## 2014-10-16 DIAGNOSIS — Z89619 Acquired absence of unspecified leg above knee: Secondary | ICD-10-CM | POA: Diagnosis not present

## 2014-10-16 DIAGNOSIS — I421 Obstructive hypertrophic cardiomyopathy: Secondary | ICD-10-CM | POA: Diagnosis not present

## 2014-10-16 DIAGNOSIS — I10 Essential (primary) hypertension: Secondary | ICD-10-CM | POA: Diagnosis not present

## 2014-10-20 DIAGNOSIS — M150 Primary generalized (osteo)arthritis: Secondary | ICD-10-CM | POA: Diagnosis not present

## 2014-10-20 DIAGNOSIS — Z4781 Encounter for orthopedic aftercare following surgical amputation: Secondary | ICD-10-CM | POA: Diagnosis not present

## 2014-10-20 DIAGNOSIS — J45909 Unspecified asthma, uncomplicated: Secondary | ICD-10-CM | POA: Diagnosis not present

## 2014-10-20 DIAGNOSIS — D649 Anemia, unspecified: Secondary | ICD-10-CM | POA: Diagnosis not present

## 2014-10-20 DIAGNOSIS — I421 Obstructive hypertrophic cardiomyopathy: Secondary | ICD-10-CM | POA: Diagnosis not present

## 2014-10-20 DIAGNOSIS — I10 Essential (primary) hypertension: Secondary | ICD-10-CM | POA: Diagnosis not present

## 2014-10-22 DIAGNOSIS — M150 Primary generalized (osteo)arthritis: Secondary | ICD-10-CM

## 2014-10-22 DIAGNOSIS — I10 Essential (primary) hypertension: Secondary | ICD-10-CM | POA: Diagnosis not present

## 2014-10-22 DIAGNOSIS — Z4781 Encounter for orthopedic aftercare following surgical amputation: Secondary | ICD-10-CM | POA: Diagnosis not present

## 2014-10-22 DIAGNOSIS — I427 Cardiomyopathy due to drug and external agent: Secondary | ICD-10-CM | POA: Diagnosis not present

## 2014-10-22 DIAGNOSIS — Z89611 Acquired absence of right leg above knee: Secondary | ICD-10-CM | POA: Diagnosis not present

## 2014-10-22 DIAGNOSIS — J45909 Unspecified asthma, uncomplicated: Secondary | ICD-10-CM

## 2014-10-22 DIAGNOSIS — D649 Anemia, unspecified: Secondary | ICD-10-CM | POA: Diagnosis not present

## 2014-10-23 DIAGNOSIS — M150 Primary generalized (osteo)arthritis: Secondary | ICD-10-CM | POA: Diagnosis not present

## 2014-10-23 DIAGNOSIS — Z4781 Encounter for orthopedic aftercare following surgical amputation: Secondary | ICD-10-CM | POA: Diagnosis not present

## 2014-10-23 DIAGNOSIS — D649 Anemia, unspecified: Secondary | ICD-10-CM | POA: Diagnosis not present

## 2014-10-23 DIAGNOSIS — I10 Essential (primary) hypertension: Secondary | ICD-10-CM | POA: Diagnosis not present

## 2014-10-23 DIAGNOSIS — J45909 Unspecified asthma, uncomplicated: Secondary | ICD-10-CM | POA: Diagnosis not present

## 2014-10-23 DIAGNOSIS — I421 Obstructive hypertrophic cardiomyopathy: Secondary | ICD-10-CM | POA: Diagnosis not present

## 2014-10-24 ENCOUNTER — Encounter: Payer: Medicare Other | Admitting: Adult Health

## 2014-10-24 ENCOUNTER — Other Ambulatory Visit: Payer: Medicare Other

## 2014-10-24 ENCOUNTER — Telehealth: Payer: Self-pay | Admitting: Adult Health

## 2014-10-24 NOTE — Telephone Encounter (Signed)
, °

## 2014-10-24 NOTE — Progress Notes (Signed)
This encounter was created in error - please disregard.

## 2014-10-28 ENCOUNTER — Encounter: Payer: Self-pay | Admitting: Cardiology

## 2014-10-30 DIAGNOSIS — D649 Anemia, unspecified: Secondary | ICD-10-CM | POA: Diagnosis not present

## 2014-10-30 DIAGNOSIS — I421 Obstructive hypertrophic cardiomyopathy: Secondary | ICD-10-CM | POA: Diagnosis not present

## 2014-10-30 DIAGNOSIS — M150 Primary generalized (osteo)arthritis: Secondary | ICD-10-CM | POA: Diagnosis not present

## 2014-10-30 DIAGNOSIS — J45909 Unspecified asthma, uncomplicated: Secondary | ICD-10-CM | POA: Diagnosis not present

## 2014-10-30 DIAGNOSIS — Z4781 Encounter for orthopedic aftercare following surgical amputation: Secondary | ICD-10-CM | POA: Diagnosis not present

## 2014-10-30 DIAGNOSIS — I10 Essential (primary) hypertension: Secondary | ICD-10-CM | POA: Diagnosis not present

## 2014-11-05 ENCOUNTER — Encounter: Payer: Medicare Other | Attending: Physical Medicine & Rehabilitation | Admitting: Physical Medicine & Rehabilitation

## 2014-11-19 ENCOUNTER — Telehealth: Payer: Self-pay | Admitting: Radiology

## 2014-11-19 NOTE — Telephone Encounter (Signed)
Patient states that she recently had amputation of Right lower extremity.  Soon to be fitted with prosthesis.  Does not require follow up with IR at present.   Baird Polinski Meacham, RN 11/19/2014 9:02 AM

## 2014-11-24 DIAGNOSIS — I10 Essential (primary) hypertension: Secondary | ICD-10-CM | POA: Diagnosis not present

## 2014-11-24 DIAGNOSIS — M810 Age-related osteoporosis without current pathological fracture: Secondary | ICD-10-CM | POA: Diagnosis not present

## 2014-11-25 ENCOUNTER — Telehealth: Payer: Self-pay | Admitting: Hematology

## 2014-11-25 DIAGNOSIS — T814XXS Infection following a procedure, sequela: Secondary | ICD-10-CM | POA: Diagnosis not present

## 2014-11-25 DIAGNOSIS — M8938 Hypertrophy of bone, other site: Secondary | ICD-10-CM | POA: Diagnosis not present

## 2014-11-25 DIAGNOSIS — Z89611 Acquired absence of right leg above knee: Secondary | ICD-10-CM | POA: Diagnosis not present

## 2014-11-25 DIAGNOSIS — Z471 Aftercare following joint replacement surgery: Secondary | ICD-10-CM | POA: Diagnosis not present

## 2014-11-25 DIAGNOSIS — S7291XA Unspecified fracture of right femur, initial encounter for closed fracture: Secondary | ICD-10-CM | POA: Diagnosis not present

## 2014-11-25 DIAGNOSIS — Z96651 Presence of right artificial knee joint: Secondary | ICD-10-CM | POA: Diagnosis not present

## 2014-11-25 NOTE — Telephone Encounter (Signed)
, °

## 2014-11-27 ENCOUNTER — Encounter (HOSPITAL_COMMUNITY): Payer: Self-pay | Admitting: Internal Medicine

## 2014-11-27 DIAGNOSIS — I10 Essential (primary) hypertension: Secondary | ICD-10-CM | POA: Diagnosis not present

## 2014-11-27 DIAGNOSIS — E875 Hyperkalemia: Secondary | ICD-10-CM | POA: Diagnosis not present

## 2014-11-27 DIAGNOSIS — M810 Age-related osteoporosis without current pathological fracture: Secondary | ICD-10-CM | POA: Diagnosis not present

## 2014-12-08 ENCOUNTER — Ambulatory Visit: Payer: Medicare Other | Attending: Physical Medicine & Rehabilitation | Admitting: Physical Therapy

## 2014-12-08 ENCOUNTER — Encounter: Payer: Self-pay | Admitting: Physical Therapy

## 2014-12-08 DIAGNOSIS — R6889 Other general symptoms and signs: Secondary | ICD-10-CM

## 2014-12-08 DIAGNOSIS — Z89611 Acquired absence of right leg above knee: Secondary | ICD-10-CM | POA: Diagnosis not present

## 2014-12-08 DIAGNOSIS — R269 Unspecified abnormalities of gait and mobility: Secondary | ICD-10-CM | POA: Diagnosis not present

## 2014-12-08 NOTE — Therapy (Signed)
[facility] [address] [place], [place], [address] Phone: [phone]   Fax:  [phone]  Physical Therapy Evaluation  Patient Details  Name: [name] MRN: [id number] Date of Birth: 1931-07-31  Encounter Date: 12/08/2014      PT End of Session - 12/08/14 1608    Visit Number 1   Number of Visits 18   Date for PT Re-Evaluation 02/07/15   PT Start Time 1230   PT Stop Time 1315   PT Time Calculation (min) 45 min   Equipment Utilized During Treatment Gait belt   Activity Tolerance Patient tolerated treatment well   Behavior During Therapy [facility] for tasks assessed/performed      Past Medical History  Diagnosis Date  . Mitral regurgitation   . Hypertrophic obstructive cardiomyopathy     Required septal myotomy  . Urinary incontinence   . Cystitis   . Hemorrhoids   . Bronchitis   . Asthma   . Anemia   . Heart block     Complete heart block post surgical requiring pacer  . Hypokalemia     Postoperative, improved  . Hyponatremia     Mild postoperative, improved  . Blood transfusion   . Constipation   . Arthritis pain   . Cancer     lt breast  . Hypertension     Benign Essential Hypertension    . Current use of long term anticoagulation   . Chronic atrial fibrillation   . Arthritis   . Pacemaker     Past Surgical History  Procedure Laterality Date  . Anterior cervical diskectomy and fusion      C-7  . Septal myotomy    . Pacemaker placement  2006/11-27-2013    MDT dual chamber pacemaker implanted 2006 with gen change by Dr Lovena Le 11-27-2013  . Replacement total knee bilateral    . Femur fracture surgery      Right  . Wrist fracture surgery      Right  . Shoulder surgery      Right  . Breast biopsy      Left, negative  . Colonoscopy    . Abdominal hysterectomy    . Cardiomyopathy    . Breast lumpectomy  2012    left breast  . Joint replacement    . Permanent pacemaker generator  change N/A 11/27/2013    Procedure: PERMANENT PACEMAKER GENERATOR CHANGE;  Surgeon: Evans Lance, MD;  Location: [facility] CATH LAB;  Service: Cardiovascular;  Laterality: N/A;    There were no vitals taken for this visit.  Visit Diagnosis:  Abnormality of gait  Activity intolerance  Status post above knee amputation of right lower extremity      Subjective Assessment - 12/08/14 1237    Symptoms This 80yo female had a right Transfemoral Amputation 09/20/14 and rec'd prosthesis 11/27/14. She presents for PT prosthetic evaluation.   Patient Stated Goals She to walk and do anything she wants to do.   Currently in Pain? No/denies          [facility] PT Assessment - 12/08/14 1230    Assessment   Medical Diagnosis Right TFA   Onset Date 11/27/14   Precautions   Precautions Fall   Restrictions   Weight Bearing Restrictions No   Balance Screen   Has the patient fallen in the past 6 months No   Has the patient had a decrease in activity level because of a fear of falling?  Yes  Is the patient reluctant to leave their home because of a fear of falling?  No   Home Environment   Living Enviornment Private residence   Living Arrangements Spouse/significant other;Children;Other relatives  6 adults & 1 child (65yo)   Type of [place] entrance  over 2" threshold   Home Layout Two level;Laundry or work area in basement;Full bath on main level;Able to live on main level with bedroom/bathroom  ranch with basement.   Alternate Level Stairs-Number of Steps 13   Alternate Level Stairs-Rails Right;Left;Can reach both   [facility] - 2 wheels;Cane - single point;Crutches;Bedside commode;Tub bench;Grab bars - toilet;Grab bars - tub/shower;Hand held shower head;Wheelchair - manual   Prior Function   Level of Independence Independent with basic ADLs;Independent with homemaking with ambulation;Independent with gait;Independent with transfers   Observation/Other Assessments    Focus on Therapeutic Outcomes (FOTO)  33  Functional Status   Ambulation/Gait   Ambulation/Gait Yes   Ambulation/Gait Assistance 4: Min assist   Ambulation Distance (Feet) 100 Feet   Assistive device Rolling walker  AKA prosthesis   Gait Pattern Step-to pattern;Decreased step length - left;Decreased stance time - right;Decreased stride length;Decreased hip/knee flexion - right;Decreased weight shift to right;Right circumduction;Right hip hike;Trunk flexed;Abducted- right;Poor foot clearance - right   Gait velocity 0.49 ft/sec   Berg Balance Test   Sit to Stand Needs minimal aid to stand or to stabilize   Standing Unsupported Able to stand 2 minutes with supervision   Sitting with Back Unsupported but Feet Supported on Floor or Stool Able to sit safely and securely 2 minutes   Stand to Sit Controls descent by using hands   Transfers Able to transfer safely, definite need of hands   Standing Unsupported with Eyes Closed Unable to keep eyes closed 3 seconds but stays steady   Standing Ubsupported with Feet Together Needs help to attain position but able to stand for 30 seconds with feet together   From Standing, Reach Forward with Outstretched Arm Reaches forward but needs supervision   From Standing Position, Pick up Object from Floor Unable to try/needs assist to keep balance   From Standing Position, Turn to Look Behind Over each Shoulder Needs assist to keep from losing balance and falling   Turn 360 Degrees Needs assistance while turning   Standing Unsupported, Alternately Place Feet on Step/Stool Needs assistance to keep from falling or unable to try   Standing Unsupported, One Foot in [employer] balance while stepping or standing   Standing on One Leg Unable to try or needs assist to prevent fall   Total Score 17         Prosthetics Assessment - 12/08/14 0001    Prosthetic Care Dependent with Skin check;Residual limb care;Prosthetic cleaning;Correct ply sock adjustment;Proper  wear schedule/adjustment   Donning prosthesis  Min assist   Doffing prosthesis  Min assist   Current prosthetic wear tolerance (days/week)  5 days /wk   Current prosthetic wear tolerance (#hours/day)  2 hrs/day   Current prosthetic weight-bearing tolerance (hours/day)  5 minutes with UE support without c/o pain or discomfort   Edema none   Residual limb condition  No open areas                         PT Education - 12/08/14 1230    Education provided Yes   Education Details Donning prosthesis with proper toe-out & pelvis not retracted,  Person(s) Educated Patient;Spouse   Methods Explanation;Demonstration   Comprehension Verbalized understanding          PT Short Term Goals - December 31, 2014 1230    PT SHORT TERM GOAL #1   Title Donnes prosthesis correctly independently. (Target Date: 01/07/15)   Time 1   Period Months   Status New   PT SHORT TERM GOAL #2   Title Tolerates daily wear >8 hours /day without tenderness or skin issues. (Target Date: 01/07/15)   Time 1   Period Months   Status New   PT SHORT TERM GOAL #3   Title ambulates 200' with walker & prosthesis modified independent. (Target Date: 01/07/15)   Time 1   Period Months   Status New   PT SHORT TERM GOAL #4   Title negotiates ramp, curb with walker & stairs with 2 rails with prosthesis modified independent,. (Target Date: 01/07/15)   Time 1   Period Months   Status New   PT SHORT TERM GOAL #5   Title reaches 10" & rotates shoulders to look behind herself without UE support with supervision. (Target Date: 01/07/15)   Time 1   Period Months   Status New           PT Long Term Goals - 2014-12-31 1230    PT LONG TERM GOAL #1   Title verbalizes & demonstrates proper prosthetic care. (Target Date: 02/06/15)   Time 2   Period Months   Status New   PT LONG TERM GOAL #2   Title tolerates wear >90% of awake hours without skin issues or tenderness. (Target Date: 02/06/15)   Time 2   Period Months    Status New   PT LONG TERM GOAL #3   Title ambulates >300' with LRAD & prosthesis modified independent. (Target Date: 02/06/15)   Time 2   Period Months   Status New   PT LONG TERM GOAL #4   Title negotiates ramp, curb & stairs with LRAD & prosthesis modified independent. (Target Date: 02/06/15)   Time 2   Period Months   Status New   PT LONG TERM GOAL #5   Title Berg Balance with prosthesis >36/56 (Target Date: 02/06/15)   Time 2   Period Months   Status New               Plan - 12-31-2014 1230    Clinical Impression Statement This 80yo female with a Transfemoral Amputation recieved her first prosthesis on 11/27/14 and is dependent in use /care. She has impaired balance limiting standing ADLs and all gait with prosthesis including barriers. She is limited in wear time which limits functional use of prosthesis and dependent in care including donning.   Pt will benefit from skilled therapeutic intervention in order to improve on the following deficits Abnormal gait;Decreased activity tolerance;Decreased balance;Decreased endurance;Decreased knowledge of use of DME;Decreased mobility;Decreased safety awareness;Decreased strength;Other (comment)  prsothetic dependency   Rehab Potential Good   PT Frequency 2x / week   PT Duration Other (comment)  2 months   PT Treatment/Interventions ADLs/Self Care Home Management;DME Instruction;Gait training;Stair training;Functional mobility training;Therapeutic activities;Therapeutic exercise;Balance training;Neuromuscular re-education;Patient/family education;Other (comment)  prosthetic training   PT Next Visit Plan review prosthetic care including donning. pre-gait activities at counter / walker.   Consulted and Agree with Plan of Care Patient;Family member/caregiver          G-Codes - 31-Dec-2014 1624    Functional Assessment Tool Used Dependent in donning & care of prosthesis. Tolerates  wear 2 hours 5 days over last week.   Functional  Limitation Self care   Self Care Current Status [phone]) At least 80 percent but less than 100 percent impaired, limited or restricted   Self Care Goal Status ([id number]) At least 1 percent but less than 20 percent impaired, limited or restricted       Problem List Patient Active Problem List   Diagnosis Date Noted  . Unilateral AKA 09/23/2014  . Preop cardiovascular exam 01/16/2014  . Atrioventricular block, complete 07/03/2013  . Atrial fibrillation 07/03/2013  . Breast cancer, left breast 04/24/2013  . Other symptoms involving cardiovascular system 12/22/2010  . PACEMAKER, PERMANENT 06/21/2010  . MITRAL REGURGITATION 06/17/2010  . HYPERTENSION 06/17/2010  . HOCM (hypertrophic obstructive cardiomyopathy) 06/17/2010  . HEART BLOCK 06/17/2010  . HEMORRHOIDS 06/17/2010  . BRONCHITIS 06/17/2010  . ASTHMA 06/17/2010  . CYSTITIS 06/17/2010  . CHEST PAIN 06/17/2010  . URINARY INCONTINENCE 06/17/2010   Jamey Reas, PT, DPT PT Specializing in Prosthetics & Orthotics 12/08/2014 4:27 PM Phone:  [phone]  Fax:  [phone] [facility] [address] [place], [place] [address]  Jamey Reas 12/08/2014, 4:26 PM  [facility] [address] [place], [place], [address] Phone: [phone]   Fax:  [phone]

## 2014-12-09 DIAGNOSIS — Z89519 Acquired absence of unspecified leg below knee: Secondary | ICD-10-CM | POA: Diagnosis not present

## 2014-12-09 DIAGNOSIS — R42 Dizziness and giddiness: Secondary | ICD-10-CM | POA: Diagnosis not present

## 2014-12-11 ENCOUNTER — Ambulatory Visit: Payer: Medicare Other | Admitting: Physical Therapy

## 2014-12-11 ENCOUNTER — Encounter: Payer: Self-pay | Admitting: Physical Therapy

## 2014-12-11 DIAGNOSIS — R269 Unspecified abnormalities of gait and mobility: Secondary | ICD-10-CM

## 2014-12-11 DIAGNOSIS — R6889 Other general symptoms and signs: Secondary | ICD-10-CM

## 2014-12-11 DIAGNOSIS — Z89611 Acquired absence of right leg above knee: Secondary | ICD-10-CM

## 2014-12-11 NOTE — Therapy (Signed)
[facility] [address] [place], [place], [address] Phone: [phone]   Fax:  [phone]  Physical Therapy Treatment  Patient Details  Name: [name] MRN: [id number] Date of Birth: 06-Dec-1931  Encounter Date: 12/11/2014      PT End of Session - 12/11/14 1230    Visit Number 2   Number of Visits 18   Date for PT Re-Evaluation 02/07/15   PT Start Time 1230   PT Stop Time 1315   PT Time Calculation (min) 45 min   Equipment Utilized During Treatment Gait belt   Activity Tolerance Patient tolerated treatment well   Behavior During Therapy [facility] for tasks assessed/performed      Past Medical History  Diagnosis Date  . Mitral regurgitation   . Hypertrophic obstructive cardiomyopathy     Required septal myotomy  . Urinary incontinence   . Cystitis   . Hemorrhoids   . Bronchitis   . Asthma   . Anemia   . Heart block     Complete heart block post surgical requiring pacer  . Hypokalemia     Postoperative, improved  . Hyponatremia     Mild postoperative, improved  . Blood transfusion   . Constipation   . Arthritis pain   . Cancer     lt breast  . Hypertension     Benign Essential Hypertension    . Current use of long term anticoagulation   . Chronic atrial fibrillation   . Arthritis   . Pacemaker     Past Surgical History  Procedure Laterality Date  . Anterior cervical diskectomy and fusion      C-7  . Septal myotomy    . Pacemaker placement  2006/11-27-2013    MDT dual chamber pacemaker implanted 2006 with gen change by Dr Lovena Le 11-27-2013  . Replacement total knee bilateral    . Femur fracture surgery      Right  . Wrist fracture surgery      Right  . Shoulder surgery      Right  . Breast biopsy      Left, negative  . Colonoscopy    . Abdominal hysterectomy    . Cardiomyopathy    . Breast lumpectomy  2012    left breast  . Joint replacement    . Permanent pacemaker generator  change N/A 11/27/2013    Procedure: PERMANENT PACEMAKER GENERATOR CHANGE;  Surgeon: Evans Lance, MD;  Location: [facility] CATH LAB;  Service: Cardiovascular;  Laterality: N/A;    There were no vitals taken for this visit.  Visit Diagnosis:  Abnormality of gait  Activity intolerance  Status post above knee amputation of right lower extremity      Subjective Assessment - 12/11/14 1230    Symptoms She went to doctor and he said she has vertigo. She brought Rx. She took medications for dizziness this morning. PT requested to not take med prior to next appt (but bring with her) so PT can assess vestibular system.   Currently in Pain? No/denies                    [facility] Adult PT Treatment/Exercise - 12/11/14 1230    Transfers   Sit to Stand 5: Supervision;With upper extremity assist;With armrests;From chair/3-in-1   Sit to Stand Details (indicate cue type and reason) verbal cues on prosthetic knee control   Stand to Sit 5: Supervision;With upper extremity assist;With armrests;To chair/3-in-1  Verbal cues on prosthetic  knee control   Prosthetics   Prosthetic Care Comments  Sitting with level pelvis (towel under left ischium /thigh) and supporting prosthesis (not dangling)   Education Provided Correct ply sock adjustment  Donning with socks, sitting positioning-level pelvis & supp    Person(s) Educated Patient   Education Method Explanation   Education Method Verbalized understanding                PT Education - 12/11/14 1230    Education provided Yes   Education Details See AVS (amputee sink exercises), see prosthetic care   Person(s) Educated Patient   Methods Explanation;Demonstration;Tactile cues;Verbal cues;Handout   Comprehension Verbalized understanding;Returned demonstration          PT Short Term Goals - 12/08/14 1230    PT SHORT TERM GOAL #1   Title Donnes prosthesis correctly independently. (Target Date: 01/07/15)   Time 1   Period Months   Status  New   PT SHORT TERM GOAL #2   Title Tolerates daily wear >8 hours /day without tenderness or skin issues. (Target Date: 01/07/15)   Time 1   Period Months   Status New   PT SHORT TERM GOAL #3   Title ambulates 200' with walker & prosthesis modified independent. (Target Date: 01/07/15)   Time 1   Period Months   Status New   PT SHORT TERM GOAL #4   Title negotiates ramp, curb with walker & stairs with 2 rails with prosthesis modified independent,. (Target Date: 01/07/15)   Time 1   Period Months   Status New   PT SHORT TERM GOAL #5   Title reaches 10" & rotates shoulders to look behind herself without UE support with supervision. (Target Date: 01/07/15)   Time 1   Period Months   Status New           PT Long Term Goals - 12/08/14 1230    PT LONG TERM GOAL #1   Title verbalizes & demonstrates proper prosthetic care. (Target Date: 02/06/15)   Time 2   Period Months   Status New   PT LONG TERM GOAL #2   Title tolerates wear >90% of awake hours without skin issues or tenderness. (Target Date: 02/06/15)   Time 2   Period Months   Status New   PT LONG TERM GOAL #3   Title ambulates >300' with LRAD & prosthesis modified independent. (Target Date: 02/06/15)   Time 2   Period Months   Status New   PT LONG TERM GOAL #4   Title negotiates ramp, curb & stairs with LRAD & prosthesis modified independent. (Target Date: 02/06/15)   Time 2   Period Months   Status New   PT LONG TERM GOAL #5   Title Berg Balance with prosthesis >36/56 (Target Date: 02/06/15)   Time 2   Period Months   Status New               Plan - 12/11/14 1230    Clinical Impression Statement [name] appears to understand HEP including using residual limb for proprioceptive feedback and reestablishing mid-line. Patient reports history of dizziness that has not lasted this long in the past. She seems to understand sitting positioning & not facilitating rotation of prosthesis.   Pt will benefit from skilled  therapeutic intervention in order to improve on the following deficits Abnormal gait;Decreased activity tolerance;Decreased balance;Decreased endurance;Decreased knowledge of use of DME;Decreased mobility;Decreased safety awareness;Decreased strength;Other (comment)  prsothetic dependency   Rehab Potential Good  PT Frequency 2x / week   PT Duration Other (comment)  2 months   PT Treatment/Interventions ADLs/Self Care Home Management;DME Instruction;Gait training;Stair training;Functional mobility training;Therapeutic activities;Therapeutic exercise;Balance training;Neuromuscular re-education;Patient/family education;Other (comment)  prosthetic training   PT Next Visit Plan Assess vestibular issues, gait with RW & prosthesis focusing on step width and prosthesis advancement with knee flexion.   Consulted and Agree with Plan of Care Patient        Problem List Patient Active Problem List   Diagnosis Date Noted  . Unilateral AKA 09/23/2014  . Preop cardiovascular exam 01/16/2014  . Atrioventricular block, complete 07/03/2013  . Atrial fibrillation 07/03/2013  . Breast cancer, left breast 04/24/2013  . Other symptoms involving cardiovascular system 12/22/2010  . PACEMAKER, PERMANENT 06/21/2010  . MITRAL REGURGITATION 06/17/2010  . HYPERTENSION 06/17/2010  . HOCM (hypertrophic obstructive cardiomyopathy) 06/17/2010  . HEART BLOCK 06/17/2010  . HEMORRHOIDS 06/17/2010  . BRONCHITIS 06/17/2010  . ASTHMA 06/17/2010  . CYSTITIS 06/17/2010  . CHEST PAIN 06/17/2010  . URINARY INCONTINENCE 06/17/2010    Jaton Eilers 12/11/2014, 2:50 PM  [facility] [address] [place], [place], [address] Phone: [phone]   Fax:  [phone]  Jamey Reas, [place], DPT PT Specializing in [place] 12/11/2014 2:51 PM Phone:  [phone]  Fax:  [phone] [facility] [address] [place], [place] [address]

## 2014-12-11 NOTE — Patient Instructions (Signed)
Do each exercise 1-2  times per day Do each exercise 10 repetitions Hold each exercise for 2 seconds to feel your location  AT [employer].  USE TAPE ON FLOOR TO MARK THE MIDLINE POSITION. You also should try to feel with your limb pressure in socket.  You are trying to feel with limb what you used to feel with the bottom of your foot.  1. Side to Side Shift: Moving your hips only (not shoulders): move weight onto your left leg, HOLD/FEEL.  Move back to equal weight on each leg, HOLD/FEEL. Move weight onto your right leg, HOLD/FEEL. Move back to equal weight on each leg, HOLD/FEEL. Repeat. 2. Front to Back Shift: Moving your hips only (not shoulders): move your weight forward onto your toes, HOLD/FEEL. Move your weight back to equal Flat Foot on both legs, HOLD/FEEL. Move your weight back onto your heels, HOLD/FEEL. Move your weight back to equal on both legs, HOLD/FEEL. Repeat. 3. Moving Cones / Cups: With equal weight on each leg: Hold on with one hand the first time, then progress to no hand supports. Move cups from one side of sink to the other. Place cups ~2" out of your reach, progress to 10" beyond reach. 4. Overhead/Upward Reaching: alternated reaching up to top cabinets or ceiling if no cabinets present. Keep equal weight on each leg. Start with one hand support on counter while other hand reaches and progress to no hand support with reaching. 5.   Looking Over Shoulders: With equal weight on each leg: alternate turning to look over your shoulders with one hand support on counter as needed. Shift weight to side looking, pull hip then shoulder then head/eyes around to look behind you.

## 2014-12-11 NOTE — Patient Instructions (Signed)
Do each exercise 1-2  times per day Do each exercise 10 repetitions Hold each exercise for 2 seconds to feel your location  AT [place].  USE TAPE ON FLOOR TO MARK THE MIDLINE POSITION. You also should try to feel with your limb pressure in socket.  You are trying to feel with limb what you used to feel with the bottom of your foot.  1. Side to Side Shift: Moving your hips only (not shoulders): move weight onto your left leg, HOLD/FEEL.  Move back to equal weight on each leg, HOLD/FEEL. Move weight onto your right leg, HOLD/FEEL. Move back to equal weight on each leg, HOLD/FEEL. Repeat. 2. Front to Back Shift: Moving your hips only (not shoulders): move your weight forward onto your toes, HOLD/FEEL. Move your weight back to equal Flat Foot on both legs, HOLD/FEEL. Move your weight back onto your heels, HOLD/FEEL. Move your weight back to equal on both legs, HOLD/FEEL. Repeat. 3. Moving Cones / Cups: With equal weight on each leg: Hold on with one hand the first time, then progress to no hand supports. Move cups from one side of sink to the other. Place cups ~2" out of your reach, progress to 10" beyond reach. 4. Overhead/Upward Reaching: alternated reaching up to top cabinets or ceiling if no cabinets present. Keep equal weight on each leg. Start with one hand support on counter while other hand reaches and progress to no hand support with reaching. 5.   Looking Over Shoulders: With equal weight on each leg: alternate turning to look over your shoulders with one hand support on counter as needed. Shift weight to side looking, pull hip then shoulder then head/eyes around to look behind you.

## 2014-12-22 ENCOUNTER — Ambulatory Visit: Payer: Medicare Other | Attending: Physical Medicine & Rehabilitation | Admitting: Physical Therapy

## 2014-12-22 ENCOUNTER — Encounter: Payer: Self-pay | Admitting: Physical Therapy

## 2014-12-22 DIAGNOSIS — R6889 Other general symptoms and signs: Secondary | ICD-10-CM | POA: Diagnosis not present

## 2014-12-22 DIAGNOSIS — R269 Unspecified abnormalities of gait and mobility: Secondary | ICD-10-CM | POA: Diagnosis not present

## 2014-12-22 DIAGNOSIS — Z89611 Acquired absence of right leg above knee: Secondary | ICD-10-CM | POA: Insufficient documentation

## 2014-12-22 DIAGNOSIS — H8112 Benign paroxysmal vertigo, left ear: Secondary | ICD-10-CM

## 2014-12-22 NOTE — Therapy (Signed)
[facility] [address] [place], [place], [address] Phone: [phone]   Fax:  [phone]  Physical Therapy Treatment  Patient Details  Name: [name] MRN: [id number] Date of Birth: 08/20/1931  Encounter Date: 12/22/2014      PT End of Session - 12/22/14 1553    Visit Number 3   Number of Visits 18   Date for PT Re-Evaluation 02/07/15   PT Start Time 1105   PT Stop Time 1200   PT Time Calculation (min) 55 min   Activity Tolerance Other (comment)  limited by vertigo /nausea   Behavior During Therapy [facility] for tasks assessed/performed      Past Medical History  Diagnosis Date  . Mitral regurgitation   . Hypertrophic obstructive cardiomyopathy     Required septal myotomy  . Urinary incontinence   . Cystitis   . Hemorrhoids   . Bronchitis   . Asthma   . Anemia   . Heart block     Complete heart block post surgical requiring pacer  . Hypokalemia     Postoperative, improved  . Hyponatremia     Mild postoperative, improved  . Blood transfusion   . Constipation   . Arthritis pain   . Cancer     lt breast  . Hypertension     Benign Essential Hypertension    . Current use of long term anticoagulation   . Chronic atrial fibrillation   . Arthritis   . Pacemaker     Past Surgical History  Procedure Laterality Date  . Anterior cervical diskectomy and fusion      C-7  . Septal myotomy    . Pacemaker placement  2006/11-27-2013    MDT dual chamber pacemaker implanted 2006 with gen change by Dr Lovena Le 11-27-2013  . Replacement total knee bilateral    . Femur fracture surgery      Right  . Wrist fracture surgery      Right  . Shoulder surgery      Right  . Breast biopsy      Left, negative  . Colonoscopy    . Abdominal hysterectomy    . Cardiomyopathy    . Breast lumpectomy  2012    left breast  . Joint replacement    . Permanent pacemaker generator change N/A 11/27/2013    Procedure:  PERMANENT PACEMAKER GENERATOR CHANGE;  Surgeon: Evans Lance, MD;  Location: [facility] CATH LAB;  Service: Cardiovascular;  Laterality: N/A;    There were no vitals taken for this visit.  Visit Diagnosis:  No diagnosis found.      Subjective Assessment - 12/22/14 1105    Symptoms Dizziness is still limiting her ability to stand or move. She did not take meds today as PT requested.   Currently in Pain? No/denies              Vestibular Assessment - 12/22/14 1544    Type of Dizziness Spinning   Aggravating Factors Turning head sideways;Supine to sit;Sit to stand;Rolling to right;Rolling to left  transfers   Smooth Pursuits Intact   Saccades Intact   Dix-Hallpike Dix-Hallpike Right;Dix-Hallpike Left   Sidelying Test Sidelying Right;Sidelying Left   Dix-Hallpike Right Symptoms No nystagmus  patient reported vertigo in test position   Dix-Hallpike Left Duration >1 minute   Dix-Hallpike Left Symptoms Upbeat, left rotatory nystagmus  changed to possible downward direction   Sidelying Right Duration 30 seconds   Sidelying Right Symptoms No nystagmus  patient reports vertigo onset ~15 seconds in position   Sidelying Left Duration >2 minutes   Sidelying Left Symptoms Upbeat, right rotatory nystagmus              [facility] Adult PT Treatment/Exercise - 12/22/14 1100    Prosthetics   Education Provided Proper wear schedule/adjustment;Correct ply sock adjustment  slippage of liner, drying sweat   Person(s) Educated Patient   Education Method Explanation   Education Method Verbalized understanding                PT Education - 12/22/14 1100    Education provided Yes   Education Details Brandt sidelying exercises 5 reps both directions 3x/day   Person(s) Educated Patient   Methods Explanation;Demonstration   Comprehension Verbalized understanding          PT Short Term Goals - 12/08/14 1230    PT SHORT TERM GOAL #1   Title Donnes prosthesis correctly  independently. (Target Date: 01/07/15)   Time 1   Period Months   Status New   PT SHORT TERM GOAL #2   Title Tolerates daily wear >8 hours /day without tenderness or skin issues. (Target Date: 01/07/15)   Time 1   Period Months   Status New   PT SHORT TERM GOAL #3   Title ambulates 200' with walker & prosthesis modified independent. (Target Date: 01/07/15)   Time 1   Period Months   Status New   PT SHORT TERM GOAL #4   Title negotiates ramp, curb with walker & stairs with 2 rails with prosthesis modified independent,. (Target Date: 01/07/15)   Time 1   Period Months   Status New   PT SHORT TERM GOAL #5   Title reaches 10" & rotates shoulders to look behind herself without UE support with supervision. (Target Date: 01/07/15)   Time 1   Period Months   Status New           PT Long Term Goals - 12/08/14 1230    PT LONG TERM GOAL #1   Title verbalizes & demonstrates proper prosthetic care. (Target Date: 02/06/15)   Time 2   Period Months   Status New   PT LONG TERM GOAL #2   Title tolerates wear >90% of awake hours without skin issues or tenderness. (Target Date: 02/06/15)   Time 2   Period Months   Status New   PT LONG TERM GOAL #3   Title ambulates >300' with LRAD & prosthesis modified independent. (Target Date: 02/06/15)   Time 2   Period Months   Status New   PT LONG TERM GOAL #4   Title negotiates ramp, curb & stairs with LRAD & prosthesis modified independent. (Target Date: 02/06/15)   Time 2   Period Months   Status New   PT LONG TERM GOAL #5   Title Berg Balance with prosthesis >36/56 (Target Date: 02/06/15)   Time 2   Period Months   Status New               Plan - 12/22/14 1100    Clinical Impression Statement [name] to have left BPPV per Guido Sander, PT specializing in vestibular issues who assisted with session. Further testing needs to be completed due on patient only tolerating limited procedures   Pt will benefit from skilled  therapeutic intervention in order to improve on the following deficits Abnormal gait;Decreased activity tolerance;Decreased balance;Decreased endurance;Decreased knowledge of use of DME;Decreased mobility;Decreased safety awareness;Decreased strength;Other (comment)  prsothetic dependency   Rehab Potential Good   PT Frequency 2x / week   PT Duration Other (comment)  2 months   PT Treatment/Interventions ADLs/Self Care Home Management;DME Instruction;Gait training;Stair training;Functional mobility training;Therapeutic activities;Therapeutic exercise;Balance training;Neuromuscular re-education;Patient/family education;Other (comment)  prosthetic training   PT Next Visit Plan Recheck left Marye Round and do CRM prn   Consulted and Agree with Plan of Care Patient        Problem List Patient Active Problem List   Diagnosis Date Noted  . Unilateral AKA 09/23/2014  . Preop cardiovascular exam 01/16/2014  . Atrioventricular block, complete 07/03/2013  . Atrial fibrillation 07/03/2013  . Breast cancer, left breast 04/24/2013  . Other symptoms involving cardiovascular system 12/22/2010  . PACEMAKER, PERMANENT 06/21/2010  . MITRAL REGURGITATION 06/17/2010  . HYPERTENSION 06/17/2010  . HOCM (hypertrophic obstructive cardiomyopathy) 06/17/2010  . HEART BLOCK 06/17/2010  . HEMORRHOIDS 06/17/2010  . BRONCHITIS 06/17/2010  . ASTHMA 06/17/2010  . CYSTITIS 06/17/2010  . CHEST PAIN 06/17/2010  . URINARY INCONTINENCE 06/17/2010   Jamey Reas, PT, DPT PT Specializing in [place] 12/22/2014 3:58 PM Phone:  [phone]  Fax:  [phone] [facility] [address] [place], [place] [address]   Jamey Reas 12/22/2014, 3:57 PM  [facility] [address] [place], [place], [address] Phone: [phone]   Fax:  [phone]

## 2014-12-25 DIAGNOSIS — D649 Anemia, unspecified: Secondary | ICD-10-CM | POA: Diagnosis not present

## 2014-12-25 DIAGNOSIS — R42 Dizziness and giddiness: Secondary | ICD-10-CM | POA: Diagnosis not present

## 2014-12-25 DIAGNOSIS — I10 Essential (primary) hypertension: Secondary | ICD-10-CM | POA: Diagnosis not present

## 2014-12-26 ENCOUNTER — Ambulatory Visit: Payer: Medicare Other | Admitting: Rehabilitative and Restorative Service Providers"

## 2014-12-26 ENCOUNTER — Other Ambulatory Visit: Payer: Self-pay | Admitting: *Deleted

## 2014-12-26 DIAGNOSIS — Z89611 Acquired absence of right leg above knee: Secondary | ICD-10-CM | POA: Diagnosis not present

## 2014-12-26 DIAGNOSIS — H8112 Benign paroxysmal vertigo, left ear: Secondary | ICD-10-CM

## 2014-12-26 DIAGNOSIS — R6889 Other general symptoms and signs: Secondary | ICD-10-CM | POA: Diagnosis not present

## 2014-12-26 DIAGNOSIS — R269 Unspecified abnormalities of gait and mobility: Secondary | ICD-10-CM | POA: Diagnosis not present

## 2014-12-26 DIAGNOSIS — C50912 Malignant neoplasm of unspecified site of left female breast: Secondary | ICD-10-CM

## 2014-12-26 NOTE — Therapy (Signed)
[name] [address] [name], [place], [address] Phone: [phone]   Fax:  [phone]  Physical Therapy Treatment  Patient Details  Name: [name] MRN: [id number] Date of Birth: [birth year] Referring Provider:  Jani Gravel, MD  Encounter Date: 12/26/2014      PT End of Session - 12/26/14 1656    Visit Number 4  G code (4)   Number of Visits 18   Date for PT Re-Evaluation 02/07/15   PT Start Time 1324   PT Stop Time 1404   PT Time Calculation (min) 40 min   Activity Tolerance --  limited by vertigo /nausea   Behavior During Therapy [facility] for tasks assessed/performed      Past Medical History  Diagnosis Date  . Mitral regurgitation   . Hypertrophic obstructive cardiomyopathy     Required septal myotomy  . Urinary incontinence   . Cystitis   . Hemorrhoids   . Bronchitis   . Asthma   . Anemia   . Heart block     Complete heart block post surgical requiring pacer  . Hypokalemia     Postoperative, improved  . Hyponatremia     Mild postoperative, improved  . Blood transfusion   . Constipation   . Arthritis pain   . Cancer     lt breast  . Hypertension     Benign Essential Hypertension    . Current use of long term anticoagulation   . Chronic atrial fibrillation   . Arthritis   . Pacemaker     Past Surgical History  Procedure Laterality Date  . Anterior cervical diskectomy and fusion      C-7  . Septal myotomy    . Pacemaker placement  2006/11-27-2013    MDT dual chamber pacemaker implanted 2006 with gen change by Dr Lovena Le 11-27-2013  . Replacement total knee bilateral    . Femur fracture surgery      Right  . Wrist fracture surgery      Right  . Shoulder surgery      Right  . Breast biopsy      Left, negative  . Colonoscopy    . Abdominal hysterectomy    . Cardiomyopathy    . Breast lumpectomy  2012    left breast  . Joint replacement    . Permanent pacemaker generator change  N/A 11/27/2013    Procedure: PERMANENT PACEMAKER GENERATOR CHANGE;  Surgeon: Evans Lance, MD;  Location: [facility] CATH LAB;  Service: Cardiovascular;  Laterality: N/A;    There were no vitals taken for this visit.  Visit Diagnosis:  Benign paroxysmal positional vertigo, left      Subjective Assessment - 12/26/14 1327    Symptoms The patient reports she left PT last session feeling worse from a dizziness standpoint.  She went home and napped for hours before feeling better.  Dizziness is overall the same since last session.   Currently in Pain? No/denies              Vestibular Assessment - 12/26/14 1328    Type of Dizziness Spinning   Duration of Dizziness --  lasts for seconds, can happen with head movement in sitting   Aggravating Factors Turning head quickly;Turning body quickly   Dix-Hallpike Dix-Hallpike Right;Dix-Hallpike Left   Sidelying Test Sidelying Right;Sidelying Left   Horizontal Canal Testing Horizontal Canal Right;Horizontal Canal Left   Sidelying Right Duration --  none   Sidelying Right Symptoms No nystagmus  return to sit provokes 5/10 symptoms   Sidelying Left Duration --  60 seconds   Sidelying Left Symptoms Upbeat, left rotatory nystagmus  with nausea, rested x 3 minutes to let nausea settle               Vestibular Treatment/Exercise - 12/26/14 1341    Vestibular Treatment Provided Habituation;Canalith Repositioning   Canalith Repositioning Epley Manuever Left   Habituation Exercises Nestor Lewandowsky   Number of Reps  --  1 using a pillow to support head/neck   Overall Response  --  provokes nausea that settles with rest   Number of Reps  --  4 reps to the left, with 3 pillows to decrease intensity    Symptom Description  --  spinning, nausea, clammy feeling               PT Education - 12/26/14 1655    Education provided Yes   Education Details Handout on BPPV from [facility] provided.   Person(s) Educated Patient   Methods  Handout;Explanation   Comprehension Verbalized understanding          PT Short Term Goals - 12/08/14 1230    PT SHORT TERM GOAL #1   Title Donnes prosthesis correctly independently. (Target Date: 01/07/15)   Time 1   Period Months   Status New   PT SHORT TERM GOAL #2   Title Tolerates daily wear >8 hours /day without tenderness or skin issues. (Target Date: 01/07/15)   Time 1   Period Months   Status New   PT SHORT TERM GOAL #3   Title ambulates 200' with walker & prosthesis modified independent. (Target Date: 01/07/15)   Time 1   Period Months   Status New   PT SHORT TERM GOAL #4   Title negotiates ramp, curb with walker & stairs with 2 rails with prosthesis modified independent,. (Target Date: 01/07/15)   Time 1   Period Months   Status New   PT SHORT TERM GOAL #5   Title reaches 10" & rotates shoulders to look behind herself without UE support with supervision. (Target Date: 01/07/15)   Time 1   Period Months   Status New           PT Long Term Goals - 12/08/14 1230    PT LONG TERM GOAL #1   Title verbalizes & demonstrates proper prosthetic care. (Target Date: 02/06/15)   Time 2   Period Months   Status New   PT LONG TERM GOAL #2   Title tolerates wear >90% of awake hours without skin issues or tenderness. (Target Date: 02/06/15)   Time 2   Period Months   Status New   PT LONG TERM GOAL #3   Title ambulates >300' with LRAD & prosthesis modified independent. (Target Date: 02/06/15)   Time 2   Period Months   Status New   PT LONG TERM GOAL #4   Title negotiates ramp, curb & stairs with LRAD & prosthesis modified independent. (Target Date: 02/06/15)   Time 2   Period Months   Status New   PT LONG TERM GOAL #5   Title Berg Balance with prosthesis >36/56 (Target Date: 02/06/15)   Time 2   Period Months   Status New               Plan - 12/26/14 1657    Clinical Impression Statement The patient continues with L posterior canalithiasis BPPV indicated by  upbeating, rotary nystagmus.  She also has the presence of a mild downbeat nystagmus noted in the L dix hallpike position after initial positional nystagmus clears.  This could indicate the presence of anterior cupulolithiasis, or possible central deficit.  At this time, her subjective symptoms appear consistent with BPPV.     PT Next Visit Plan Recheck L dix hallpike and do CRM, review brandt daroff (with 3 pillows to improve tolerane) coaching patient to use visual cues to spot.  Recommended patient take nausea meds as her MD prescribed to tolerate treatment for BPPV.   Consulted and Agree with Plan of Care Patient        Problem List Patient Active Problem List   Diagnosis Date Noted  . Unilateral AKA 09/23/2014  . Preop cardiovascular exam 01/16/2014  . Atrioventricular block, complete 07/03/2013  . Atrial fibrillation 07/03/2013  . Breast cancer, left breast 04/24/2013  . Other symptoms involving cardiovascular system 12/22/2010  . PACEMAKER, PERMANENT 06/21/2010  . MITRAL REGURGITATION 06/17/2010  . HYPERTENSION 06/17/2010  . HOCM (hypertrophic obstructive cardiomyopathy) 06/17/2010  . HEART BLOCK 06/17/2010  . HEMORRHOIDS 06/17/2010  . BRONCHITIS 06/17/2010  . ASTHMA 06/17/2010  . CYSTITIS 06/17/2010  . CHEST PAIN 06/17/2010  . URINARY INCONTINENCE 06/17/2010    Perrion Diesel, PT 12/26/2014, 5:04 PM  [facility] [address] [place], [place], [address] Phone: [phone]   Fax:  [phone]

## 2014-12-29 ENCOUNTER — Ambulatory Visit (HOSPITAL_BASED_OUTPATIENT_CLINIC_OR_DEPARTMENT_OTHER): Payer: Medicare Other | Admitting: Hematology

## 2014-12-29 ENCOUNTER — Telehealth: Payer: Self-pay | Admitting: Hematology

## 2014-12-29 ENCOUNTER — Other Ambulatory Visit (HOSPITAL_BASED_OUTPATIENT_CLINIC_OR_DEPARTMENT_OTHER): Payer: Medicare Other

## 2014-12-29 ENCOUNTER — Encounter: Payer: Self-pay | Admitting: Hematology

## 2014-12-29 VITALS — BP 137/85 | HR 87 | Temp 97.4°F | Resp 18 | Ht 60.0 in | Wt 122.9 lb

## 2014-12-29 DIAGNOSIS — M810 Age-related osteoporosis without current pathological fracture: Secondary | ICD-10-CM | POA: Diagnosis not present

## 2014-12-29 DIAGNOSIS — Z170 Estrogen receptor positive status [ER+]: Secondary | ICD-10-CM | POA: Diagnosis not present

## 2014-12-29 DIAGNOSIS — D0512 Intraductal carcinoma in situ of left breast: Secondary | ICD-10-CM

## 2014-12-29 DIAGNOSIS — C50912 Malignant neoplasm of unspecified site of left female breast: Secondary | ICD-10-CM

## 2014-12-29 LAB — CBC WITH DIFFERENTIAL/PLATELET
BASO%: 1 % (ref 0.0–2.0)
Basophils Absolute: 0.1 10*3/uL (ref 0.0–0.1)
EOS%: 2.5 % (ref 0.0–7.0)
Eosinophils Absolute: 0.2 10*3/uL (ref 0.0–0.5)
HCT: 41 % (ref 34.8–46.6)
HGB: 12.6 g/dL (ref 11.6–15.9)
LYMPH%: 13.3 % — ABNORMAL LOW (ref 14.0–49.7)
MCH: 27.8 pg (ref 25.1–34.0)
MCHC: 30.7 g/dL — ABNORMAL LOW (ref 31.5–36.0)
MCV: 90.5 fL (ref 79.5–101.0)
MONO#: 0.7 10*3/uL (ref 0.1–0.9)
MONO%: 11.4 % (ref 0.0–14.0)
NEUT#: 4.7 10*3/uL (ref 1.5–6.5)
NEUT%: 71.8 % (ref 38.4–76.8)
Platelets: 208 10*3/uL (ref 145–400)
RBC: 4.53 10*6/uL (ref 3.70–5.45)
RDW: 23.1 % — ABNORMAL HIGH (ref 11.2–14.5)
WBC: 6.5 10*3/uL (ref 3.9–10.3)
lymph#: 0.9 10*3/uL (ref 0.9–3.3)

## 2014-12-29 LAB — COMPREHENSIVE METABOLIC PANEL (CC13)
ALT: 13 U/L (ref 0–55)
AST: 21 U/L (ref 5–34)
Albumin: 3.8 g/dL (ref 3.5–5.0)
Alkaline Phosphatase: 65 U/L (ref 40–150)
Anion Gap: 7 mEq/L (ref 3–11)
BUN: 23.2 mg/dL (ref 7.0–26.0)
CO2: 30 mEq/L — ABNORMAL HIGH (ref 22–29)
Calcium: 9.7 mg/dL (ref 8.4–10.4)
Chloride: 104 mEq/L (ref 98–109)
Creatinine: 1.1 mg/dL (ref 0.6–1.1)
EGFR: 48 mL/min/{1.73_m2} — ABNORMAL LOW (ref >90)
Glucose: 80 mg/dl (ref 70–140)
Potassium: 4.8 mEq/L (ref 3.5–5.1)
Sodium: 141 mEq/L (ref 136–145)
Total Bilirubin: 0.41 mg/dL (ref 0.20–1.20)
Total Protein: 6.5 g/dL (ref 6.4–8.3)

## 2014-12-29 NOTE — Progress Notes (Addendum)
OFFICE PROGRESS NOTE  CC  [name], [place] [address] [place] [address]  DIAGNOSIS: 81 year old female with DCIS of the left breast diagnosed 04/05/2011. The tumor was ER positive PR positive  PRIOR THERAPY:  #1 patient is status post lumpectomy on 04/05/2011 for a high-grade DCIS that was ER and PR positive.  #2 she then underwent radiation therapy from 05/23/2011 2 06/26/2011 for a total of 52.5 gray.  #3 she was then begun on Aromasin 25 mg daily starting 07/18/2011. A total of 5 years of therapy is planned.  This was discontinued in April 2014 due to continued worsening in osteoporosis.    CURRENT THERAPY: Observation  INTERVAL HISTORY: [name] 81 y.o. female returns for followup visit at 6 months.  She had right leg above knee amputation at [facility] in Oct 2015. She recovered well. She walks with a walker now, not very active at home, and uses a wheelchair when goes out. She denies any pain, or other new symptoms. Her last mammogram was in March 2014.    MEDICAL HISTORY: Past Medical History  Diagnosis Date  . Mitral regurgitation   . Hypertrophic obstructive cardiomyopathy     Required septal myotomy  . Urinary incontinence   . Cystitis   . Hemorrhoids   . Bronchitis   . Asthma   . Anemia   . Heart block     Complete heart block post surgical requiring pacer  . Hypokalemia     Postoperative, improved  . Hyponatremia     Mild postoperative, improved  . Blood transfusion   . Constipation   . Arthritis pain   . Cancer     lt breast  . Hypertension     Benign Essential Hypertension    . Current use of long term anticoagulation   . Chronic atrial fibrillation   . Arthritis   . Pacemaker     ALLERGIES:  is allergic to ceftaroline; codeine; morphine; and sulfa antibiotics.  MEDICATIONS:  Current Outpatient Prescriptions  Medication Sig Dispense Refill  . acetaminophen (TYLENOL) 500 MG tablet Take 1,000 mg by mouth every 6 (six) hours  as needed for moderate pain.     Marland Kitchen albuterol (PROAIR HFA) 108 (90 BASE) MCG/ACT inhaler Inhale 2 puffs into the lungs every 6 (six) hours as needed for wheezing. 1 Inhaler 3  . alendronate (FOSAMAX) 70 MG tablet Take 70 mg by mouth every 7 (seven) days. Take with a full glass of water on an empty stomach. Takes on Thursday    . amLODipine (NORVASC) 2.5 MG tablet Take by mouth.    Marland Kitchen apixaban (ELIQUIS) 2.5 MG TABS tablet Take 1 tablet (2.5 mg total) by mouth 2 (two) times daily. 60 tablet 1  . calcium citrate-vitamin D (CITRACAL+D) 315-200 MG-UNIT per tablet Take 1 tablet by mouth 2 (two) times daily. 60 tablet 1  . cyclobenzaprine (FLEXERIL) 5 MG tablet Take 1 tablet (5 mg total) by mouth daily. 30 tablet 0  . cycloSPORINE (RESTASIS) 0.05 % ophthalmic emulsion Place 1 drop into both eyes 2 (two) times daily. 0.4 mL 1  . fluticasone (FLONASE) 50 MCG/ACT nasal spray Place 2 sprays into both nostrils 2 (two) times daily.     . furosemide (LASIX) 20 MG tablet Take 1 tablet (20 mg total) by mouth 2 (two) times daily. 60 tablet 1  . gabapentin (NEURONTIN) 300 MG capsule Take 1 capsule (300 mg total) by mouth 3 (three) times daily. 90 capsule 1  . metoprolol  succinate (TOPROL-XL) 25 MG 24 hr tablet Take 1 tablet (25 mg total) by mouth daily. 30 tablet 1  . mirtazapine (REMERON) 15 MG tablet Take 1 tablet (15 mg total) by mouth at bedtime. 30 tablet 1  . Multiple Vitamin (MULTIVITAMIN) tablet Take 1 tablet by mouth 2 (two) times daily.     Marland Kitchen oxyCODONE 10 MG TABS Take 1 tablet (10 mg total) by mouth every 4 (four) hours as needed for severe pain. 90 tablet 0  . potassium chloride SA (K-DUR,KLOR-CON) 20 MEQ tablet Take 1 tablet (20 mEq total) by mouth 2 (two) times daily. 60 tablet 1  . senna (SENOKOT) 8.6 MG tablet Take 1 tablet by mouth 2 (two) times daily as needed for constipation.     . traMADol (ULTRAM) 50 MG tablet Take by mouth.     No current facility-administered medications for this visit.     SURGICAL HISTORY:  Past Surgical History  Procedure Laterality Date  . Anterior cervical diskectomy and fusion      C-7  . Septal myotomy    . Pacemaker placement  2006/11-27-2013    MDT dual chamber pacemaker implanted 2006 with gen change by Dr Lovena Le 11-27-2013  . Replacement total knee bilateral    . Femur fracture surgery      Right  . Wrist fracture surgery      Right  . Shoulder surgery      Right  . Breast biopsy      Left, negative  . Colonoscopy    . Abdominal hysterectomy    . Cardiomyopathy    . Breast lumpectomy  2012    left breast  . Joint replacement    . Permanent pacemaker generator change N/A 11/27/2013    Procedure: PERMANENT PACEMAKER GENERATOR CHANGE;  Surgeon: Evans Lance, MD;  Location: [facility] CATH LAB;  Service: Cardiovascular;  Laterality: N/A;    REVIEW OF SYSTEMS:  A 10 point review of systems was conducted and is otherwise negative except for what is noted above.     HEALTH MAINTENANCE:  Mammogram: 02/2013, normal Colonoscopy: 4 years ago, no follow up Bone Density: 02/2013, osteoporosis on Fosamax Pap Smear:s/p TAH, several years Eye Exam: 10/2012 Vitamin D: 04/2013 Lipid Panel: 05/2012  PHYSICAL EXAMINATION: Blood pressure 137/85, pulse 87, temperature 97.4 F (36.3 C), temperature source Oral, resp. rate 18, height 5' (1.524 m), weight 122 lb 14.4 oz (55.747 kg), SpO2 99 %. Body mass index is 24 kg/(m^2). General: Patient is a well appearing female in no acute distress HEENT: PERRLA, sclerae anicteric no conjunctival pallor, MMM Neck: supple, no palpable adenopathy Lungs: clear to auscultation bilaterally, no wheezes, rhonchi, or rales Cardiovascular: regular rate rhythm, S1, S2, no murmurs, rubs or gallops Abdomen: Soft, non-tender, non-distended, normoactive bowel sounds, no HSM Extremities: warm and well perfused, no clubbing, cyanosis, or edema Skin: No rashes or lesions Neuro: Non-focal Bilateral breast exam no masses  nipple discharge left breast reveals well-healed incisional scar there is noted to be scar tissue but no discrete masses. Right breast no masses or nipple discharge.  ECOG PERFORMANCE STATUS: 3     LABORATORY DATA: Lab Results  Component Value Date   WBC 6.5 12/29/2014   HGB 12.6 12/29/2014   HCT 41.0 12/29/2014   MCV 90.5 12/29/2014   PLT 208 12/29/2014      Chemistry      Component Value Date/Time   NA 141 12/29/2014 1002   NA 144 09/27/2014 0737   K 4.8 12/29/2014  1002   K 4.5 09/27/2014 0737   CL 104 09/27/2014 0737   CL 105 04/24/2013 1002   CO2 30* 12/29/2014 1002   CO2 29 09/27/2014 0737   BUN 23.2 12/29/2014 1002   BUN 21 09/27/2014 0737   CREATININE 1.1 12/29/2014 1002   CREATININE 1.26* 09/29/2014 1138      Component Value Date/Time   CALCIUM 9.7 12/29/2014 1002   CALCIUM 8.6 09/27/2014 0737   ALKPHOS 65 12/29/2014 1002   ALKPHOS 102 09/24/2014 0500   AST 21 12/29/2014 1002   AST 26 09/24/2014 0500   ALT 13 12/29/2014 1002   ALT 15 09/24/2014 0500   BILITOT 0.41 12/29/2014 1002   BILITOT 0.4 09/24/2014 0500       RADIOGRAPHIC STUDIES:  No results found.  ASSESSMENT AND PLAN: 81 year old female with DCIS of the left breast status post lumpectomy followed by radiation previously on Aromasin as a chemopreventive 25 mg daily which she stopped due to worsenng osteoporosis. She is taking vitamin D and she has her vitamin D levels checked at her primary care physician's office. She has no evidence of recurrent disease.  1. Left breast DCIS, high grade, ER+/PR+ -clinically doing weii, no evidence of recurrence -Continue surveillance. -She is overdue for mammogram, will schedule one for her today. -continue Calcium, Vit D and fosamax for her osteoporosis. - I encouraged her to have some physical therapy.  She will continue follow-up with her primary care physician and also orthopedic surgeon.  Follow-up: 6 months.    Truitt Merle  12/29/2014, 11:06  AM

## 2014-12-29 NOTE — Telephone Encounter (Signed)
Pt confirmed labs/ov per 01/11 POF, gave pt AVS..... KJ, set up apt for mm pt confirmed

## 2014-12-30 ENCOUNTER — Ambulatory Visit: Payer: Medicare Other | Admitting: Physical Therapy

## 2014-12-30 ENCOUNTER — Encounter: Payer: Self-pay | Admitting: Physical Therapy

## 2014-12-30 DIAGNOSIS — R269 Unspecified abnormalities of gait and mobility: Secondary | ICD-10-CM

## 2014-12-30 DIAGNOSIS — H8112 Benign paroxysmal vertigo, left ear: Secondary | ICD-10-CM

## 2014-12-30 DIAGNOSIS — Z89611 Acquired absence of right leg above knee: Secondary | ICD-10-CM

## 2014-12-30 DIAGNOSIS — R6889 Other general symptoms and signs: Secondary | ICD-10-CM

## 2014-12-30 NOTE — Therapy (Signed)
[facility] [address] [place], [place], [address] Phone: [phone]   Fax:  [phone]  Physical Therapy Treatment  Patient Details  Name: [name] MRN: [id number] Date of Birth: 08/28/1931 Referring Provider:  Jani Gravel, MD  Encounter Date: 12/30/2014      PT End of Session - 12/30/14 1315    Visit Number 5  G code (4)   Number of Visits 18   Date for PT Re-Evaluation 02/07/15   PT Start Time 1315   PT Stop Time 1400   PT Time Calculation (min) 45 min   Equipment Utilized During Treatment Gait belt   Activity Tolerance Patient tolerated treatment well  limited by vertigo /nausea   Behavior During Therapy [facility] for tasks assessed/performed      Past Medical History  Diagnosis Date  . Mitral regurgitation   . Hypertrophic obstructive cardiomyopathy     Required septal myotomy  . Urinary incontinence   . Cystitis   . Hemorrhoids   . Bronchitis   . Asthma   . Anemia   . Heart block     Complete heart block post surgical requiring pacer  . Hypokalemia     Postoperative, improved  . Hyponatremia     Mild postoperative, improved  . Blood transfusion   . Constipation   . Arthritis pain   . Cancer     lt breast  . Hypertension     Benign Essential Hypertension    . Current use of long term anticoagulation   . Chronic atrial fibrillation   . Arthritis   . Pacemaker     Past Surgical History  Procedure Laterality Date  . Anterior cervical diskectomy and fusion      C-7  . Septal myotomy    . Pacemaker placement  2006/11-27-2013    MDT dual chamber pacemaker implanted 2006 with gen change by Dr Lovena Le 11-27-2013  . Replacement total knee bilateral    . Femur fracture surgery      Right  . Wrist fracture surgery      Right  . Shoulder surgery      Right  . Breast biopsy      Left, negative  . Colonoscopy    . Abdominal hysterectomy    . Cardiomyopathy    . Breast lumpectomy  2012     left breast  . Joint replacement    . Permanent pacemaker generator change N/A 11/27/2013    Procedure: PERMANENT PACEMAKER GENERATOR CHANGE;  Surgeon: Evans Lance, MD;  Location: [facility] CATH LAB;  Service: Cardiovascular;  Laterality: N/A;    There were no vitals taken for this visit.  Visit Diagnosis:  Benign paroxysmal positional vertigo, left  Abnormality of gait  Activity intolerance  Status post above knee amputation of right lower extremity      Subjective Assessment - 12/30/14 1324    Symptoms Dizziness has cleared with no issues over the last 2 days. Wearing prosthesis most of awake hours without issues.   Currently in Pain? No/denies                    [facility] Adult PT Treatment/Exercise - 12/30/14 1315    Transfers   Sit to Stand 5: Supervision;With upper extremity assist;With armrests;From chair/3-in-1   Sit to Stand Details (indicate cue type and reason) cues on prosthetic knee control   Stand to Sit 5: Supervision;With upper extremity assist;With armrests;To chair/3-in-1   Ambulation/Gait   Ambulation/Gait  Yes   Ambulation/Gait Assistance 4: Min guard;5: Supervision   Ambulation/Gait Assistance Details pre-gait in parallel bars working on proper unlocking prosthetic knee /advancement and wt shift onto prosthesis in stance.   Ambulation Distance (Feet) 20 Feet  20' in parallel bars, 65' with RW   Assistive device Rolling walker;Prosthesis   Gait Pattern Step-through pattern;Decreased stance time - right;Decreased stride length;Abducted- right;Trunk flexed   Ambulation Surface Level;Indoor   Ramp 4: Min assist   Ramp Details (indicate cue type and reason) verbal & tactile cues on technique   Curb 4: Min assist   Curb Details (indicate cue type and reason) verbal & tactile cues on technique   Gait Comments stairs with 2 rails /prosthesis with min gaurd    Dynamic Standing Balance   Dynamic Standing - Balance Support No upper extremity supported   minimal assist /tactile cues from PT   Dynamic Standing - Level of Assistance 4: Min assist   Dynamic Standing - Balance Activities Compliant surfaces;Eyes open;Eyes closed;Head turns;Head nods  EO & EC on floor and EO only on compliant    Dynamic Standing - Comments No dizziness reported with any head movements.   Prosthetics   Current prosthetic wear tolerance (days/week)  7 days /wk   Current prosthetic wear tolerance (#hours/day)  4-8 hours/day total   Current prosthetic weight-bearing tolerance (hours/day)  15 minutes with PWB   Residual limb condition  No open areas                  PT Short Term Goals - 12/08/14 1230    PT SHORT TERM GOAL #1   Title Donnes prosthesis correctly independently. (Target Date: 01/07/15)   Time 1   Period Months   Status New   PT SHORT TERM GOAL #2   Title Tolerates daily wear >8 hours /day without tenderness or skin issues. (Target Date: 01/07/15)   Time 1   Period Months   Status New   PT SHORT TERM GOAL #3   Title ambulates 200' with walker & prosthesis modified independent. (Target Date: 01/07/15)   Time 1   Period Months   Status New   PT SHORT TERM GOAL #4   Title negotiates ramp, curb with walker & stairs with 2 rails with prosthesis modified independent,. (Target Date: 01/07/15)   Time 1   Period Months   Status New   PT SHORT TERM GOAL #5   Title reaches 10" & rotates shoulders to look behind herself without UE support with supervision. (Target Date: 01/07/15)   Time 1   Period Months   Status New           PT Long Term Goals - 12/08/14 1230    PT LONG TERM GOAL #1   Title verbalizes & demonstrates proper prosthetic care. (Target Date: 02/06/15)   Time 2   Period Months   Status New   PT LONG TERM GOAL #2   Title tolerates wear >90% of awake hours without skin issues or tenderness. (Target Date: 02/06/15)   Time 2   Period Months   Status New   PT LONG TERM GOAL #3   Title ambulates >300' with LRAD & prosthesis  modified independent. (Target Date: 02/06/15)   Time 2   Period Months   Status New   PT LONG TERM GOAL #4   Title negotiates ramp, curb & stairs with LRAD & prosthesis modified independent. (Target Date: 02/06/15)   Time 2   Period Months  Status New   PT LONG TERM GOAL #5   Title Berg Balance with prosthesis >36/56 (Target Date: 02/06/15)   Time 2   Period Months   Status New               Plan - 12/30/14 1315    Clinical Impression Statement Patient's dizziness appears to have stopped after last PT session with treatment. Patient's prosthesis appears too short. She has an appt tomorrow with prosthetist to address.    Pt will benefit from skilled therapeutic intervention in order to improve on the following deficits Abnormal gait;Decreased activity tolerance;Decreased balance;Decreased endurance;Decreased knowledge of use of DME;Decreased mobility;Decreased safety awareness;Decreased strength;Other (comment)   Rehab Potential Good   PT Frequency 2x / week   PT Treatment/Interventions ADLs/Self Care Home Management;DME Instruction;Gait training;Stair training;Functional mobility training;Therapeutic activities;Therapeutic exercise;Balance training;Neuromuscular re-education;Patient/family education;Other (comment)   PT Next Visit Plan assess if vestibular issues are clear. Prosthetic gait with RW and balance activities.   Consulted and Agree with Plan of Care Patient        Problem List Patient Active Problem List   Diagnosis Date Noted  . Unilateral AKA 09/23/2014  . Preop cardiovascular exam 01/16/2014  . Atrioventricular block, complete 07/03/2013  . Atrial fibrillation 07/03/2013  . Breast cancer, left breast 04/24/2013  . Other symptoms involving cardiovascular system 12/22/2010  . PACEMAKER, PERMANENT 06/21/2010  . MITRAL REGURGITATION 06/17/2010  . HYPERTENSION 06/17/2010  . HOCM (hypertrophic obstructive cardiomyopathy) 06/17/2010  . HEART BLOCK 06/17/2010  .  HEMORRHOIDS 06/17/2010  . BRONCHITIS 06/17/2010  . ASTHMA 06/17/2010  . CYSTITIS 06/17/2010  . CHEST PAIN 06/17/2010  . URINARY INCONTINENCE 06/17/2010    Ilia Dimaano 12/30/2014, 5:54 PM  [facility] [address] [place], [place], [address] Phone: [phone]   Fax:  [phone]     Jamey Reas, [place], DPT PT Specializing in [place] 12/30/2014 5:55 PM Phone:  [phone]  Fax:  [phone] [facility] [address] [place], [place] [address]

## 2015-01-02 ENCOUNTER — Ambulatory Visit: Payer: Medicare Other | Admitting: Rehabilitative and Restorative Service Providers"

## 2015-01-02 DIAGNOSIS — Z89611 Acquired absence of right leg above knee: Secondary | ICD-10-CM | POA: Diagnosis not present

## 2015-01-02 DIAGNOSIS — R6889 Other general symptoms and signs: Secondary | ICD-10-CM | POA: Diagnosis not present

## 2015-01-02 DIAGNOSIS — R269 Unspecified abnormalities of gait and mobility: Secondary | ICD-10-CM

## 2015-01-02 NOTE — Therapy (Signed)
[facility] [address] [place], [place], [address] Phone: [phone]   Fax:  [phone]  Physical Therapy Treatment  Patient Details  Name: [name] MRN: [id number] Date of Birth: Apr 05, 1931 Referring Provider:  Jani Gravel, MD  Encounter Date: 01/02/2015      PT End of Session - 01/02/15 1527    Visit Number 6  G code 6   Number of Visits 18   Date for PT Re-Evaluation 02/07/15   PT Start Time 9735   PT Stop Time 1405   PT Time Calculation (min) 41 min   Equipment Utilized During Treatment Gait belt   Activity Tolerance Patient tolerated treatment well   Behavior During Therapy [facility] for tasks assessed/performed      Past Medical History  Diagnosis Date  . Mitral regurgitation   . Hypertrophic obstructive cardiomyopathy     Required septal myotomy  . Urinary incontinence   . Cystitis   . Hemorrhoids   . Bronchitis   . Asthma   . Anemia   . Heart block     Complete heart block post surgical requiring pacer  . Hypokalemia     Postoperative, improved  . Hyponatremia     Mild postoperative, improved  . Blood transfusion   . Constipation   . Arthritis pain   . Cancer     lt breast  . Hypertension     Benign Essential Hypertension    . Current use of long term anticoagulation   . Chronic atrial fibrillation   . Arthritis   . Pacemaker     Past Surgical History  Procedure Laterality Date  . Anterior cervical diskectomy and fusion      C-7  . Septal myotomy    . Pacemaker placement  2006/11-27-2013    MDT dual chamber pacemaker implanted 2006 with gen change by Dr Lovena Le 11-27-2013  . Replacement total knee bilateral    . Femur fracture surgery      Right  . Wrist fracture surgery      Right  . Shoulder surgery      Right  . Breast biopsy      Left, negative  . Colonoscopy    . Abdominal hysterectomy    . Cardiomyopathy    . Breast lumpectomy  2012    left breast  . Joint  replacement    . Permanent pacemaker generator change N/A 11/27/2013    Procedure: PERMANENT PACEMAKER GENERATOR CHANGE;  Surgeon: Evans Lance, MD;  Location: [facility] CATH LAB;  Service: Cardiovascular;  Laterality: N/A;    There were no vitals taken for this visit.  Visit Diagnosis:  Abnormality of gait      Subjective Assessment - 01/02/15 1329    Symptoms The patient reports that she had prosthesis adjusted to be shorter and she is having difficulty controlling the knee and clearing her prosthetic foot.  [place] [employer] and had help to get up from the floor.  She denies any injury.     Currently in Pain? No/denies     PROSTHETIC TRAINING: Gait activities with RW and R prosthesis with min A for R LE cues and to assist 2 times when knee buckled x 100 ft x 2, 230 ft, 175 ft, and 115 ft all with cues on posture and R heel strike with longer L stride.  Prosthetic training activities in the parallel bars with min A for tactile cues emphasizing hip position over  knee, use of residual limb to control weight shift in prosthesis and posture. Standing R weight shift in parallel bars moving L foot into/out of stride position with bilateral UE support decreasing to unilateral UE support (on R), moving L foot to/from 2" block with min A for R weight shift and stability.        PT Education - 01/02/15 1527    Education provided Yes   Education Details Recommended have family stand by for ambulation as she is having to work to control knee on prosthesis today.    Person(s) Educated Patient   Methods Explanation   Comprehension Verbalized understanding          PT Short Term Goals - 12/08/14 1230    PT SHORT TERM GOAL #1   Title Donnes prosthesis correctly independently. (Target Date: 01/07/15)   Time 1   Period Months   Status New   PT SHORT TERM GOAL #2   Title Tolerates daily wear >8 hours /day without tenderness or skin issues. (Target Date: 01/07/15)   Time 1    Period Months   Status New   PT SHORT TERM GOAL #3   Title ambulates 200' with walker & prosthesis modified independent. (Target Date: 01/07/15)   Time 1   Period Months   Status New   PT SHORT TERM GOAL #4   Title negotiates ramp, curb with walker & stairs with 2 rails with prosthesis modified independent,. (Target Date: 01/07/15)   Time 1   Period Months   Status New   PT SHORT TERM GOAL #5   Title reaches 10" & rotates shoulders to look behind herself without UE support with supervision. (Target Date: 01/07/15)   Time 1   Period Months   Status New           PT Long Term Goals - 12/08/14 1230    PT LONG TERM GOAL #1   Title verbalizes & demonstrates proper prosthetic care. (Target Date: 02/06/15)   Time 2   Period Months   Status New   PT LONG TERM GOAL #2   Title tolerates wear >90% of awake hours without skin issues or tenderness. (Target Date: 02/06/15)   Time 2   Period Months   Status New   PT LONG TERM GOAL #3   Title ambulates >300' with LRAD & prosthesis modified independent. (Target Date: 02/06/15)   Time 2   Period Months   Status New   PT LONG TERM GOAL #4   Title negotiates ramp, curb & stairs with LRAD & prosthesis modified independent. (Target Date: 02/06/15)   Time 2   Period Months   Status New   PT LONG TERM GOAL #5   Title Berg Balance with prosthesis >36/56 (Target Date: 02/06/15)   Time 2   Period Months   Status New               Plan - 01/02/15 1528    Clinical Impression Statement The patient continues with R hip positioned inferior to L hip.  Since prosthetic adjustment, she is reporting decreased ability to control her knee.  She is looking down, which is creating increased hip flexion, and therefore increased knee flexion.  PT and patient worked on awareness of leg position and weight shifting/posture to improve knee control.   PT Next Visit Plan Prosthetic gait with RW and balance/weight shifting activities.   Consulted and Agree  with Plan of Care Patient  Problem List Patient Active Problem List   Diagnosis Date Noted  . Unilateral AKA 09/23/2014  . Preop cardiovascular exam 01/16/2014  . Atrioventricular block, complete 07/03/2013  . Atrial fibrillation 07/03/2013  . Breast cancer, left breast 04/24/2013  . Other symptoms involving cardiovascular system 12/22/2010  . PACEMAKER, PERMANENT 06/21/2010  . MITRAL REGURGITATION 06/17/2010  . HYPERTENSION 06/17/2010  . HOCM (hypertrophic obstructive cardiomyopathy) 06/17/2010  . HEART BLOCK 06/17/2010  . HEMORRHOIDS 06/17/2010  . BRONCHITIS 06/17/2010  . ASTHMA 06/17/2010  . CYSTITIS 06/17/2010  . CHEST PAIN 06/17/2010  . URINARY INCONTINENCE 06/17/2010    Nance Mccombs, PT 01/02/2015, 4:36 PM  [facility] [address] [place], [place], [address] Phone: [phone]   Fax:  [phone]

## 2015-01-06 ENCOUNTER — Ambulatory Visit: Payer: Medicare Other | Admitting: Physical Therapy

## 2015-01-06 ENCOUNTER — Encounter: Payer: Self-pay | Admitting: Physical Therapy

## 2015-01-06 DIAGNOSIS — R6889 Other general symptoms and signs: Secondary | ICD-10-CM | POA: Diagnosis not present

## 2015-01-06 DIAGNOSIS — H8112 Benign paroxysmal vertigo, left ear: Secondary | ICD-10-CM

## 2015-01-06 DIAGNOSIS — R269 Unspecified abnormalities of gait and mobility: Secondary | ICD-10-CM

## 2015-01-06 DIAGNOSIS — Z89611 Acquired absence of right leg above knee: Secondary | ICD-10-CM | POA: Diagnosis not present

## 2015-01-06 NOTE — Therapy (Signed)
[facility] [address] [place], [place], [address] Phone: [phone]   Fax:  [phone]  Physical Therapy Treatment  Patient Details  Name: [name] MRN: [id number] Date of Birth: May 24, 1931 Referring Provider:  Jani Gravel, MD  Encounter Date: 01/06/2015      PT End of Session - 01/06/15 1315    Visit Number 6  G code   Number of Visits 18   Date for PT Re-Evaluation 02/07/15   PT Start Time 1315   PT Stop Time 1400   PT Time Calculation (min) 45 min   Equipment Utilized During Treatment Gait belt   Activity Tolerance Patient tolerated treatment well   Behavior During Therapy [facility] for tasks assessed/performed      Past Medical History  Diagnosis Date  . Mitral regurgitation   . Hypertrophic obstructive cardiomyopathy     Required septal myotomy  . Urinary incontinence   . Cystitis   . Hemorrhoids   . Bronchitis   . Asthma   . Anemia   . Heart block     Complete heart block post surgical requiring pacer  . Hypokalemia     Postoperative, improved  . Hyponatremia     Mild postoperative, improved  . Blood transfusion   . Constipation   . Arthritis pain   . Cancer     lt breast  . Hypertension     Benign Essential Hypertension    . Current use of long term anticoagulation   . Chronic atrial fibrillation   . Arthritis   . Pacemaker     Past Surgical History  Procedure Laterality Date  . Anterior cervical diskectomy and fusion      C-7  . Septal myotomy    . Pacemaker placement  2006/11-27-2013    MDT dual chamber pacemaker implanted 2006 with gen change by Dr Lovena Le 11-27-2013  . Replacement total knee bilateral    . Femur fracture surgery      Right  . Wrist fracture surgery      Right  . Shoulder surgery      Right  . Breast biopsy      Left, negative  . Colonoscopy    . Abdominal hysterectomy    . Cardiomyopathy    . Breast lumpectomy  2012    left breast  . Joint  replacement    . Permanent pacemaker generator change N/A 11/27/2013    Procedure: PERMANENT PACEMAKER GENERATOR CHANGE;  Surgeon: Evans Lance, MD;  Location: [facility] CATH LAB;  Service: Cardiovascular;  Laterality: N/A;    There were no vitals taken for this visit.  Visit Diagnosis:  Abnormality of gait  Benign paroxysmal positional vertigo, left  Activity intolerance  Status post above knee amputation of right lower extremity      Subjective Assessment - 01/06/15 1412    Symptoms Patient reports wearing prosthesis most of awake hours. No more falls. Knee is still hard to control.   Currently in Pain? No/denies                    [facility] Adult PT Treatment/Exercise - 01/06/15 1315    Transfers   Sit to Stand 5: Supervision;With upper extremity assist;With armrests;From chair/3-in-1   Sit to Stand Details (indicate cue type and reason) cues on weighting prosthetic heel to engage knee   Stand to Sit 5: Supervision;With upper extremity assist;With armrests;To chair/3-in-1   Stand to Sit Details cues on unlocking knee to  sit   Ambulation/Gait   Ambulation/Gait Yes   Ambulation/Gait Assistance 5: Supervision   Ambulation Distance (Feet) 200 Feet  150' 2nd ambulation   Assistive device Rolling walker;Prosthesis   Gait Pattern Step-through pattern;Decreased stance time - right;Decreased stride length;Trunk flexed   Ambulation Surface Level;Indoor   Stairs Yes   Stairs Assistance 5: Supervision   Stairs Assistance Details (indicate cue type and reason) cues on wt shift to control knee   Stair Management Technique Two rails;Step to pattern;Forwards   Number of Stairs 4   Ramp 4: Min assist   Ramp Details (indicate cue type and reason) cues on wt shift to engage knee   Curb 4: Min assist   Curb Details (indicate cue type and reason) cues on foot position when moving RW   Gait Comments --   Dynamic Standing Balance   Dynamic Standing - Balance Support No upper extremity  supported;Left upper extremity supported;Right upper extremity supported  light hand support with red theraband UE activities   Dynamic Standing - Level of Assistance 4: Min assist   Dynamic Standing - Balance Activities Eyes open;Eyes closed;Head turns;Head nods;Reaching for objects;Forward lean/weight shifting  No UE with min A to lean forward to touch knee level   Dynamic Standing - Comments looking over shoulders with min A, no dizziness  single UE red theraband row, upward reach & forward reach   Prosthetics   Prosthetic Care Comments  checking liner for slippage while toileting, sweating /slippage   Current prosthetic wear tolerance (days/week)  7 days /wk   Current prosthetic wear tolerance (#hours/day)  ~8 hours/day total   Current prosthetic weight-bearing tolerance (hours/day)  15 minutes with PWB   Residual limb condition  No open areas   Education Provided Correct ply sock adjustment;Other (comment)  proper donning   Person(s) Educated Patient   Education Method Explanation;Demonstration   Education Method Verbalized understanding;Returned demonstration;Needs further instruction   Donning Prosthesis Supervision  Cued on proper donning especially liner                PT Education - 01/06/15 1427    Education provided Yes   Education Details See prosthetic instruction   Person(s) Educated Patient   Methods Explanation;Demonstration;Tactile cues;Verbal cues   Comprehension Verbalized understanding;Returned demonstration;Need further instruction          PT Short Term Goals - 12/08/14 1230    PT SHORT TERM GOAL #1   Title Donnes prosthesis correctly independently. (Target Date: 01/07/15)   Time 1   Period Months   Status New   PT SHORT TERM GOAL #2   Title Tolerates daily wear >8 hours /day without tenderness or skin issues. (Target Date: 01/07/15)   Time 1   Period Months   Status New   PT SHORT TERM GOAL #3   Title ambulates 200' with walker & prosthesis  modified independent. (Target Date: 01/07/15)   Time 1   Period Months   Status New   PT SHORT TERM GOAL #4   Title negotiates ramp, curb with walker & stairs with 2 rails with prosthesis modified independent,. (Target Date: 01/07/15)   Time 1   Period Months   Status New   PT SHORT TERM GOAL #5   Title reaches 10" & rotates shoulders to look behind herself without UE support with supervision. (Target Date: 01/07/15)   Time 1   Period Months   Status New           PT Long Term Goals -  12/08/14 1230    PT LONG TERM GOAL #1   Title verbalizes & demonstrates proper prosthetic care. (Target Date: 02/06/15)   Time 2   Period Months   Status New   PT LONG TERM GOAL #2   Title tolerates wear >90% of awake hours without skin issues or tenderness. (Target Date: 02/06/15)   Time 2   Period Months   Status New   PT LONG TERM GOAL #3   Title ambulates >300' with LRAD & prosthesis modified independent. (Target Date: 02/06/15)   Time 2   Period Months   Status New   PT LONG TERM GOAL #4   Title negotiates ramp, curb & stairs with LRAD & prosthesis modified independent. (Target Date: 02/06/15)   Time 2   Period Months   Status New   PT LONG TERM GOAL #5   Title Berg Balance with prosthesis >36/56 (Target Date: 02/06/15)   Time 2   Period Months   Status New               Plan - 01/06/15 1315    Clinical Impression Statement Patient's knee control improved significantly once liner donned without ~1" space distally. Patient also able to clear prosthesis with cues on posture and knee flexion at terminal stance to initiate swing.   Pt will benefit from skilled therapeutic intervention in order to improve on the following deficits Abnormal gait;Decreased activity tolerance;Decreased balance;Decreased endurance;Decreased knowledge of use of DME;Decreased mobility;Decreased safety awareness;Decreased strength;Other (comment)   Rehab Potential Good   PT Frequency 2x / week   PT  Treatment/Interventions ADLs/Self Care Home Management;DME Instruction;Gait training;Stair training;Functional mobility training;Therapeutic activities;Therapeutic exercise;Balance training;Neuromuscular re-education;Patient/family education;Other (comment)  prosthetic training & vestibular    PT Next Visit Plan Prosthetic gait with RW and balance/weight shifting activities.   Consulted and Agree with Plan of Care Patient        Problem List Patient Active Problem List   Diagnosis Date Noted  . Unilateral AKA 09/23/2014  . Preop cardiovascular exam 01/16/2014  . Atrioventricular block, complete 07/03/2013  . Atrial fibrillation 07/03/2013  . Breast cancer, left breast 04/24/2013  . Other symptoms involving cardiovascular system 12/22/2010  . PACEMAKER, PERMANENT 06/21/2010  . MITRAL REGURGITATION 06/17/2010  . HYPERTENSION 06/17/2010  . HOCM (hypertrophic obstructive cardiomyopathy) 06/17/2010  . HEART BLOCK 06/17/2010  . HEMORRHOIDS 06/17/2010  . BRONCHITIS 06/17/2010  . ASTHMA 06/17/2010  . CYSTITIS 06/17/2010  . CHEST PAIN 06/17/2010  . URINARY INCONTINENCE 06/17/2010    Jamey Reas PT, DPT PT specializing in prosthetics 01/06/2015, 2:37 PM  [facility] [address] [place], [place], [address] Phone: [phone]   Fax:  [phone]

## 2015-01-07 ENCOUNTER — Ambulatory Visit
Admission: RE | Admit: 2015-01-07 | Discharge: 2015-01-07 | Disposition: A | Discharge status: Home / Self Care | Payer: Medicare Other | Source: Ambulatory Visit | Attending: Hematology | Admitting: Hematology

## 2015-01-07 ENCOUNTER — Other Ambulatory Visit: Payer: Self-pay | Admitting: Hematology

## 2015-01-07 DIAGNOSIS — C50912 Malignant neoplasm of unspecified site of left female breast: Secondary | ICD-10-CM

## 2015-01-07 DIAGNOSIS — Z853 Personal history of malignant neoplasm of breast: Secondary | ICD-10-CM | POA: Diagnosis not present

## 2015-01-09 ENCOUNTER — Ambulatory Visit: Payer: Medicare Other | Admitting: Rehabilitative and Restorative Service Providers"

## 2015-01-13 ENCOUNTER — Ambulatory Visit: Payer: Medicare Other | Admitting: Physical Therapy

## 2015-01-13 ENCOUNTER — Encounter: Payer: Self-pay | Admitting: Physical Therapy

## 2015-01-13 DIAGNOSIS — R6889 Other general symptoms and signs: Secondary | ICD-10-CM

## 2015-01-13 DIAGNOSIS — R269 Unspecified abnormalities of gait and mobility: Secondary | ICD-10-CM

## 2015-01-13 DIAGNOSIS — Z89611 Acquired absence of right leg above knee: Secondary | ICD-10-CM

## 2015-01-13 NOTE — Therapy (Signed)
[facility] [address] [place], [place], [address] Phone: [phone]   Fax:  [phone]  Physical Therapy Treatment  Patient Details  Name: [name] MRN: [id number] Date of Birth: 09-03-1931 Referring Provider:  Jani Gravel, MD  Encounter Date: 01/13/2015      PT End of Session - 01/13/15 1445    Visit Number 8  G code   Number of Visits 18   Date for PT Re-Evaluation 02/07/15   PT Start Time 1315   PT Stop Time 1400   PT Time Calculation (min) 45 min   Equipment Utilized During Treatment Gait belt   Activity Tolerance Patient tolerated treatment well   Behavior During Therapy [facility] for tasks assessed/performed      Past Medical History  Diagnosis Date  . Mitral regurgitation   . Hypertrophic obstructive cardiomyopathy     Required septal myotomy  . Urinary incontinence   . Cystitis   . Hemorrhoids   . Bronchitis   . Asthma   . Anemia   . Heart block     Complete heart block post surgical requiring pacer  . Hypokalemia     Postoperative, improved  . Hyponatremia     Mild postoperative, improved  . Blood transfusion   . Constipation   . Arthritis pain   . Cancer     lt breast  . Hypertension     Benign Essential Hypertension    . Current use of long term anticoagulation   . Chronic atrial fibrillation   . Arthritis   . Pacemaker     Past Surgical History  Procedure Laterality Date  . Anterior cervical diskectomy and fusion      C-7  . Septal myotomy    . Pacemaker placement  2006/11-27-2013    MDT dual chamber pacemaker implanted 2006 with gen change by Dr Lovena Le 11-27-2013  . Replacement total knee bilateral    . Femur fracture surgery      Right  . Wrist fracture surgery      Right  . Shoulder surgery      Right  . Breast biopsy      Left, negative  . Colonoscopy    . Abdominal hysterectomy    . Cardiomyopathy    . Breast lumpectomy  2012    left breast  . Joint  replacement    . Permanent pacemaker generator change N/A 11/27/2013    Procedure: PERMANENT PACEMAKER GENERATOR CHANGE;  Surgeon: Evans Lance, MD;  Location: [facility] CATH LAB;  Service: Cardiovascular;  Laterality: N/A;    There were no vitals taken for this visit.  Visit Diagnosis:  Abnormality of gait  Activity intolerance  Status post above knee amputation of right lower extremity      Subjective Assessment - 01/13/15 1321    Symptoms She reports wearing all day awake without issues with her skin. She planned to bring her prosthetic socks but forgot them.   Currently in Pain? No/denies                    [facility] Adult PT Treatment/Exercise - 01/13/15 1315    Transfers   Sit to Stand 5: Supervision;With upper extremity assist;With armrests;From chair/3-in-1   Stand to Sit 5: Supervision;With upper extremity assist;With armrests;To chair/3-in-1   Ambulation/Gait   Ambulation/Gait Yes   Ambulation/Gait Assistance 5: Supervision;4: Min guard;3: Mod assist  SBA with RW, Min gaurd crutches, mod A /hand hold [facility]   Ambulation  Distance (Feet) 200 Feet  200' with RW, 150' with crutches, 35' with [facility]   Assistive device Rolling walker;Prosthesis;Crutches;Small based quad cane   Gait Pattern Step-through pattern;Decreased stance time - right;Decreased stride length;Trunk flexed   Ambulation Surface Level;Indoor   Stairs Yes   Stairs Assistance 5: Supervision   Stairs Assistance Details (indicate cue type and reason) cues on sequence with crutch   Stair Management Technique Step to pattern;Forwards;One rail Left;With crutches  crutch in right UE and rail in left with prosthesis   Number of Stairs 4   Ramp 4: Min assist  crutches & prosthesis   Ramp Details (indicate cue type and reason) cues on sequence & wt shift   Curb 4: Min assist  crutches & prosthesis   Curb Details (indicate cue type and reason) cues on sequence & technique   Dynamic Standing Balance   Dynamic  Standing - Balance Support No upper extremity supported;During functional activity  occassional touch on walls in corner or RW   Dynamic Standing - Level of Assistance 4: Min assist;5: Stand by assistance   Dynamic Standing - Balance Activities Eyes open;Eyes closed;Head turns;Head nods;Forward lean/weight shifting;Lateral lean/weight shifting   Prosthetics   Prosthetic Care Comments  --   Current prosthetic wear tolerance (days/week)  7 days /wk   Current prosthetic wear tolerance (#hours/day)  ~12 hours/day total   Current prosthetic weight-bearing tolerance (hours/day)  15 minutes with PWB   Residual limb condition  No open areas                  PT Short Term Goals - 12/08/14 1230    PT SHORT TERM GOAL #1   Title Donnes prosthesis correctly independently. (Target Date: 01/07/15)   Time 1   Period Months   Status New   PT SHORT TERM GOAL #2   Title Tolerates daily wear >8 hours /day without tenderness or skin issues. (Target Date: 01/07/15)   Time 1   Period Months   Status New   PT SHORT TERM GOAL #3   Title ambulates 200' with walker & prosthesis modified independent. (Target Date: 01/07/15)   Time 1   Period Months   Status New   PT SHORT TERM GOAL #4   Title negotiates ramp, curb with walker & stairs with 2 rails with prosthesis modified independent,. (Target Date: 01/07/15)   Time 1   Period Months   Status New   PT SHORT TERM GOAL #5   Title reaches 10" & rotates shoulders to look behind herself without UE support with supervision. (Target Date: 01/07/15)   Time 1   Period Months   Status New           PT Long Term Goals - 12/08/14 1230    PT LONG TERM GOAL #1   Title verbalizes & demonstrates proper prosthetic care. (Target Date: 02/06/15)   Time 2   Period Months   Status New   PT LONG TERM GOAL #2   Title tolerates wear >90% of awake hours without skin issues or tenderness. (Target Date: 02/06/15)   Time 2   Period Months   Status New   PT LONG  TERM GOAL #3   Title ambulates >300' with LRAD & prosthesis modified independent. (Target Date: 02/06/15)   Time 2   Period Months   Status New   PT LONG TERM GOAL #4   Title negotiates ramp, curb & stairs with LRAD & prosthesis modified independent. (Target Date: 02/06/15)  Time 2   Period Months   Status New   PT LONG TERM GOAL #5   Title Berg Balance with prosthesis >36/56 (Target Date: 02/06/15)   Time 2   Period Months   Status New               Plan - 01/13/15 1315    Clinical Impression Statement Patient had improved posture with crutches vs RW. She could improve left step length to step thru pattern with cues for ~2 steps then would decrease to step to until cued again. Patient was able to improve balance with activities in safe environment.   Pt will benefit from skilled therapeutic intervention in order to improve on the following deficits Abnormal gait;Decreased activity tolerance;Decreased balance;Decreased endurance;Decreased knowledge of use of DME;Decreased mobility;Decreased safety awareness;Decreased strength;Other (comment)   Rehab Potential Good   PT Frequency 2x / week   PT Treatment/Interventions ADLs/Self Care Home Management;DME Instruction;Gait training;Stair training;Functional mobility training;Therapeutic activities;Therapeutic exercise;Balance training;Neuromuscular re-education;Patient/family education;Other (comment)  prosthetic training & vestibular    PT Next Visit Plan Prosthetic gait with crutches & SBQC and balance/weight shifting activities.   Consulted and Agree with Plan of Care Patient        Problem List Patient Active Problem List   Diagnosis Date Noted  . Unilateral AKA 09/23/2014  . Preop cardiovascular exam 01/16/2014  . Atrioventricular block, complete 07/03/2013  . Atrial fibrillation 07/03/2013  . Breast cancer, left breast 04/24/2013  . Other symptoms involving cardiovascular system 12/22/2010  . PACEMAKER, PERMANENT  06/21/2010  . MITRAL REGURGITATION 06/17/2010  . HYPERTENSION 06/17/2010  . HOCM (hypertrophic obstructive cardiomyopathy) 06/17/2010  . HEART BLOCK 06/17/2010  . HEMORRHOIDS 06/17/2010  . BRONCHITIS 06/17/2010  . ASTHMA 06/17/2010  . CYSTITIS 06/17/2010  . CHEST PAIN 06/17/2010  . URINARY INCONTINENCE 06/17/2010    Jamey Reas PT, DPT PT Specializing in Prosthetics 01/13/2015, 6:03 PM  [facility] [address] [place], [place], [address] Phone: [phone]   Fax:  [phone]

## 2015-01-16 ENCOUNTER — Ambulatory Visit: Payer: Medicare Other | Admitting: Rehabilitative and Restorative Service Providers"

## 2015-01-16 DIAGNOSIS — R269 Unspecified abnormalities of gait and mobility: Secondary | ICD-10-CM | POA: Diagnosis not present

## 2015-01-16 DIAGNOSIS — R6889 Other general symptoms and signs: Secondary | ICD-10-CM | POA: Diagnosis not present

## 2015-01-16 DIAGNOSIS — Z89611 Acquired absence of right leg above knee: Secondary | ICD-10-CM | POA: Diagnosis not present

## 2015-01-16 NOTE — Therapy (Signed)
[facility] [address] [place], [place], [address] Phone: [phone]   Fax:  [phone]  Physical Therapy Treatment  Patient Details  Name: [name] MRN: [id number] Date of Birth: [birth year] Referring Provider:  Jani Gravel, MD  Encounter Date: 01/16/2015    Past Medical History  Diagnosis Date  . Mitral regurgitation   . Hypertrophic obstructive cardiomyopathy     Required septal myotomy  . Urinary incontinence   . Cystitis   . Hemorrhoids   . Bronchitis   . Asthma   . Anemia   . Heart block     Complete heart block post surgical requiring pacer  . Hypokalemia     Postoperative, improved  . Hyponatremia     Mild postoperative, improved  . Blood transfusion   . Constipation   . Arthritis pain   . Cancer     lt breast  . Hypertension     Benign Essential Hypertension    . Current use of long term anticoagulation   . Chronic atrial fibrillation   . Arthritis   . Pacemaker     Past Surgical History  Procedure Laterality Date  . Anterior cervical diskectomy and fusion      C-7  . Septal myotomy    . Pacemaker placement  2006/11-27-2013    MDT dual chamber pacemaker implanted 2006 with gen change by Dr Lovena Le 11-27-2013  . Replacement total knee bilateral    . Femur fracture surgery      Right  . Wrist fracture surgery      Right  . Shoulder surgery      Right  . Breast biopsy      Left, negative  . Colonoscopy    . Abdominal hysterectomy    . Cardiomyopathy    . Breast lumpectomy  2012    left breast  . Joint replacement    . Permanent pacemaker generator change N/A 11/27/2013    Procedure: PERMANENT PACEMAKER GENERATOR CHANGE;  Surgeon: Evans Lance, MD;  Location: [facility] CATH LAB;  Service: Cardiovascular;  Laterality: N/A;    There were no vitals taken for this visit.  Visit Diagnosis:  Abnormality of gait      Subjective Assessment - 01/16/15 1704    Symptoms The patient  reports she remembered her sock today to learn how to donn with liner.  She is continuing to experience pressure at medial pelvis from prosthesis.   Currently in Pain? No/denies     PROSTHETIC TRAINING: Patient ambulated with supervision and RW x 220 ft nonstop.  Supervision recommended due to occasional R toe drag (prosthetic side) with patient needing cues to correct before loading prosthesis. Ambulates 100 ft with RW with supervision and cues upright posture and improved heel strike of prosthesis at initial contact.  SELF CARE/HOME MANAGEMENT: Patient brought sock.  PT and patient donned sock with instruction on proper use of sock and placement with prosthetic lining.  Pt needed min A for managing/smoothing sock over liner. Patient reported that she forgot her gel to assist with donning prosthesis and states "I'll be able to get it on without the gel, but I don't think I can get it off".  PT recommended she carry a small bag (or use her RW basket) to maintain her supplies to adequately manage her prosthesis.  Patient verbalizes understanding.  Due to not re-donning her prosthesis, treatment ended early and patient was taken to her car in w/c.  PT Short Term Goals - 01/16/15 1328    PT SHORT TERM GOAL #1   Title Donnes prosthesis correctly independently. (Target Date: 01/07/15)   Baseline Pt still needs cues/assist with sock ply management, although she reports donning her prosthesis indep.  She requires assist to remove prosthesis per reports.   Time 1   Period Months   Status Partially Met   PT SHORT TERM GOAL #2   Title Tolerates daily wear >8 hours /day without tenderness or skin issues. (Target Date: 01/07/15)   Baseline Pt reports wearing prosthesis >10 hours/day.   Time 1   Period Months   Status Achieved   PT SHORT TERM GOAL #3   Title ambulates 200' with walker & prosthesis modified independent. (Target Date: 01/07/15)   Baseline Pt is able to ambulate 200 ft with RW and  prosthesis with close supervision to CGA.   Time 1   Period Months   Status Partially Met   PT SHORT TERM GOAL #4   Title negotiates ramp, curb with walker & stairs with 2 rails with prosthesis modified independent,. (Target Date: 01/07/15)   Time 1   Period Months   Status On-going   PT SHORT TERM GOAL #5   Title reaches 10" & rotates shoulders to look behind herself without UE support with supervision. (Target Date: 01/07/15)   Time 1   Period Months   Status On-going             PT Long Term Goals - 12/08/14 1230    PT LONG TERM GOAL #1   Title verbalizes & demonstrates proper prosthetic care. (Target Date: 02/06/15)   Time 2   Period Months   Status New   PT LONG TERM GOAL #2   Title tolerates wear >90% of awake hours without skin issues or tenderness. (Target Date: 02/06/15)   Time 2   Period Months   Status New   PT LONG TERM GOAL #3   Title ambulates >300' with LRAD & prosthesis modified independent. (Target Date: 02/06/15)   Time 2   Period Months   Status New   PT LONG TERM GOAL #4   Title negotiates ramp, curb & stairs with LRAD & prosthesis modified independent. (Target Date: 02/06/15)   Time 2   Period Months   Status New   PT LONG TERM GOAL #5   Title Berg Balance with prosthesis >36/56 (Target Date: 02/06/15)   Time 2   Period Months   Status New               Problem List Patient Active Problem List   Diagnosis Date Noted  . Unilateral AKA 09/23/2014  . Preop cardiovascular exam 01/16/2014  . Atrioventricular block, complete 07/03/2013  . Atrial fibrillation 07/03/2013  . Breast cancer, left breast 04/24/2013  . Other symptoms involving cardiovascular system 12/22/2010  . PACEMAKER, PERMANENT 06/21/2010  . MITRAL REGURGITATION 06/17/2010  . HYPERTENSION 06/17/2010  . HOCM (hypertrophic obstructive cardiomyopathy) 06/17/2010  . HEART BLOCK 06/17/2010  . HEMORRHOIDS 06/17/2010  . BRONCHITIS 06/17/2010  . ASTHMA 06/17/2010  . CYSTITIS  06/17/2010  . CHEST PAIN 06/17/2010  . URINARY INCONTINENCE 06/17/2010    ,, PT 01/16/2015, 5:18 PM  [facility] [address] [place], [place], [address] Phone: [phone]   Fax:  [phone]     

## 2015-01-18 DIAGNOSIS — Z950 Presence of cardiac pacemaker: Secondary | ICD-10-CM | POA: Diagnosis not present

## 2015-01-20 ENCOUNTER — Encounter: Payer: Self-pay | Admitting: Physical Therapy

## 2015-01-20 ENCOUNTER — Ambulatory Visit: Payer: Medicare Other | Attending: Physical Medicine & Rehabilitation | Admitting: Physical Therapy

## 2015-01-20 DIAGNOSIS — Z89611 Acquired absence of right leg above knee: Secondary | ICD-10-CM | POA: Diagnosis not present

## 2015-01-20 DIAGNOSIS — R269 Unspecified abnormalities of gait and mobility: Secondary | ICD-10-CM | POA: Diagnosis not present

## 2015-01-20 DIAGNOSIS — R6889 Other general symptoms and signs: Secondary | ICD-10-CM | POA: Insufficient documentation

## 2015-01-20 NOTE — Therapy (Signed)
[facility] [address] [place], [place], [address] Phone: [phone]   Fax:  [phone]  Physical Therapy Treatment  Patient Details  Name: [name] MRN: [id number] Date of Birth: March 03, 1931 Referring Provider:  Jani Gravel, MD  Encounter Date: 01/20/2015      PT End of Session - 01/20/15 1230    Visit Number 10  G code 9   Number of Visits 18   Date for PT Re-Evaluation 02/07/15   PT Start Time 1230   PT Stop Time 1315   PT Time Calculation (min) 45 min   Equipment Utilized During Treatment Gait belt   Activity Tolerance Patient tolerated treatment well   Behavior During Therapy [facility] for tasks assessed/performed      Past Medical History  Diagnosis Date  . Mitral regurgitation   . Hypertrophic obstructive cardiomyopathy     Required septal myotomy  . Urinary incontinence   . Cystitis   . Hemorrhoids   . Bronchitis   . Asthma   . Anemia   . Heart block     Complete heart block post surgical requiring pacer  . Hypokalemia     Postoperative, improved  . Hyponatremia     Mild postoperative, improved  . Blood transfusion   . Constipation   . Arthritis pain   . Cancer     lt breast  . Hypertension     Benign Essential Hypertension    . Current use of long term anticoagulation   . Chronic atrial fibrillation   . Arthritis   . Pacemaker     Past Surgical History  Procedure Laterality Date  . Anterior cervical diskectomy and fusion      C-7  . Septal myotomy    . Pacemaker placement  2006/11-27-2013    MDT dual chamber pacemaker implanted 2006 with gen change by Dr Lovena Le 11-27-2013  . Replacement total knee bilateral    . Femur fracture surgery      Right  . Wrist fracture surgery      Right  . Shoulder surgery      Right  . Breast biopsy      Left, negative  . Colonoscopy    . Abdominal hysterectomy    . Cardiomyopathy    . Breast lumpectomy  2012    left breast  . Joint  replacement    . Permanent pacemaker generator change N/A 11/27/2013    Procedure: PERMANENT PACEMAKER GENERATOR CHANGE;  Surgeon: Evans Lance, MD;  Location: [facility] CATH LAB;  Service: Cardiovascular;  Laterality: N/A;    There were no vitals taken for this visit.  Visit Diagnosis:  Abnormality of gait  Activity intolerance  Status post above knee amputation of right lower extremity      Subjective Assessment - 01/20/15 1805    Symptoms She reports wearing prosthesis from ~1-2 hours after getting up to ~7pm (~3 hours before going to bed). Which is ~75% of awake hours.   Currently in Pain? No/denies                    [facility] Adult PT Treatment/Exercise - 01/20/15 1230    Transfers   Sit to Stand 5: Supervision;With upper extremity assist;With armrests;From chair/3-in-1  chair without armrests pushing with UEs   Stand to Sit 5: Supervision;With upper extremity assist;With armrests;To chair/3-in-1  chair without armrests pushing with UEs   Ambulation/Gait   Ambulation/Gait Yes   Ambulation/Gait Assistance 5: Supervision  Ambulation/Gait Assistance Details manual & verbal cues on pelvic motion & posture to improve clearance   Ambulation Distance (Feet) 200 Feet  x2 with rolling walker   Assistive device Rolling walker;Prosthesis;Crutches;Small based quad cane   Gait Pattern Step-through pattern;Decreased stance time - right;Decreased stride length;Trunk flexed   Ambulation Surface Level;Indoor   Stairs Yes   Stairs Assistance 5: Supervision   Stair Management Technique Step to pattern;With crutches;One rail Right;Sideways  using 2 hands on right rail   Number of Stairs 4   Ramp 5: Supervision  rolling walker & prosthesis   Curb 5: Supervision  rolling walker & prosthesis   Dynamic Standing Balance   Dynamic Standing - Balance Support No upper extremity supported;During functional activity  occassional touch on walls in corner or RW   Dynamic Standing - Level of  Assistance 4: Min assist;5: Stand by assistance   Dynamic Standing - Balance Activities Eyes open;Eyes closed;Head turns;Head nods;Forward lean/weight shifting;Lateral lean/weight shifting  picking up object from floor   Dynamic Standing - Comments PT demo /instructed in abd to keep prosthetic knee extended   Prosthetics   Current prosthetic wear tolerance (days/week)  7 days /wk   Current prosthetic wear tolerance (#hours/day)  ~12 hours/day total   Current prosthetic weight-bearing tolerance (hours/day)  15 minutes with PWB   Residual limb condition  No open areas   Education Provided Correct ply sock adjustment  donning with sock   Person(s) Educated Patient   Education Method Explanation;Demonstration   Education Method Verbalized understanding;Needs further instruction                PT Education - 01/20/15 1230    Education provided Yes   Education Details Fall risk when prosthesis off and she is awake out of bed.   Person(s) Educated Patient   Methods Explanation   Comprehension Verbalized understanding          PT Short Term Goals - 01/16/15 1328    PT SHORT TERM GOAL #1   Title Donnes prosthesis correctly independently. (Target Date: 01/07/15)   Baseline Pt still needs cues/assist with sock ply management, although she reports donning her prosthesis indep.  She requires assist to remove prosthesis per reports.   Time 1   Period Months   Status Partially Met   PT SHORT TERM GOAL #2   Title Tolerates daily wear >8 hours /day without tenderness or skin issues. (Target Date: 01/07/15)   Baseline Pt reports wearing prosthesis >10 hours/day.   Time 1   Period Months   Status Achieved   PT SHORT TERM GOAL #3   Title ambulates 200' with walker & prosthesis modified independent. (Target Date: 01/07/15)   Baseline Pt is able to ambulate 200 ft with RW and prosthesis with close supervision to [place].   Time 1   Period Months   Status Partially Met   PT SHORT TERM GOAL  #4   Title negotiates ramp, curb with walker & stairs with 2 rails with prosthesis modified independent,. (Target Date: 01/07/15)   Time 1   Period Months   Status On-going   PT SHORT TERM GOAL #5   Title reaches 10" & rotates shoulders to look behind herself without UE support with supervision. (Target Date: 01/07/15)   Time 1   Period Months   Status On-going           PT Long Term Goals - 12/08/14 1230    PT LONG TERM GOAL #1   Title verbalizes &  demonstrates proper prosthetic care. (Target Date: 02/06/15)   Time 2   Period Months   Status New   PT LONG TERM GOAL #2   Title tolerates wear >90% of awake hours without skin issues or tenderness. (Target Date: 02/06/15)   Time 2   Period Months   Status New   PT LONG TERM GOAL #3   Title ambulates >300' with LRAD & prosthesis modified independent. (Target Date: 02/06/15)   Time 2   Period Months   Status New   PT LONG TERM GOAL #4   Title negotiates ramp, curb & stairs with LRAD & prosthesis modified independent. (Target Date: 02/06/15)   Time 2   Period Months   Status New   PT LONG TERM GOAL #5   Title Berg Balance with prosthesis >36/56 (Target Date: 02/06/15)   Time 2   Period Months   Status New               Plan - 2015/01/25 1230    Clinical Impression Statement Patient appears to understand how to use prosthetic socks now and proper donning. She improved ability to arise from chair without armrests pushing with UEs.    Pt will benefit from skilled therapeutic intervention in order to improve on the following deficits Abnormal gait;Decreased activity tolerance;Decreased balance;Decreased endurance;Decreased knowledge of use of DME;Decreased mobility;Decreased safety awareness;Decreased strength;Other (comment)   Rehab Potential Good   PT Frequency 2x / week   PT Duration Other (comment)  2 months   PT Treatment/Interventions ADLs/Self Care Home Management;DME Instruction;Gait training;Stair training;Functional  mobility training;Therapeutic activities;Therapeutic exercise;Balance training;Neuromuscular re-education;Patient/family education;Other (comment)   PT Next Visit Plan Finish checking STGs, do G code.  Prosthetic gait with crutches & SBQC and balance/weight shifting activities.   Consulted and Agree with Plan of Care Patient          G-Codes - 25-Jan-2015 1814    Functional Assessment Tool Used Independent with donning. Minimal cues for adjusting ply sockes. Wearing prosthesis ~75 % of awake hours.   Functional Limitation Self care   Mobility: Walking and Moving Around Current Status [phone]) At least 20 percent but less than 40 percent impaired, limited or restricted   Mobility: Walking and Moving Around Goal Status [phone]) At least 1 percent but less than 20 percent impaired, limited or restricted      Problem List Patient Active Problem List   Diagnosis Date Noted  . Unilateral AKA 09/23/2014  . Preop cardiovascular exam 01/16/2014  . Atrioventricular block, complete 07/03/2013  . Atrial fibrillation 07/03/2013  . Breast cancer, left breast 04/24/2013  . Other symptoms involving cardiovascular system 12/22/2010  . PACEMAKER, PERMANENT 06/21/2010  . MITRAL REGURGITATION 06/17/2010  . HYPERTENSION 06/17/2010  . HOCM (hypertrophic obstructive cardiomyopathy) 06/17/2010  . HEART BLOCK 06/17/2010  . HEMORRHOIDS 06/17/2010  . BRONCHITIS 06/17/2010  . ASTHMA 06/17/2010  . CYSTITIS 06/17/2010  . CHEST PAIN 06/17/2010  . URINARY INCONTINENCE 06/17/2010    Jamey Reas PT, DPT 01/25/15, 6:16 PM  [facility] [address] [place], [place], [address] Phone: [phone]   Fax:  [phone]

## 2015-01-21 DIAGNOSIS — Z4781 Encounter for orthopedic aftercare following surgical amputation: Secondary | ICD-10-CM | POA: Diagnosis not present

## 2015-01-21 DIAGNOSIS — Z89611 Acquired absence of right leg above knee: Secondary | ICD-10-CM | POA: Diagnosis not present

## 2015-01-23 ENCOUNTER — Ambulatory Visit: Payer: Medicare Other | Admitting: Rehabilitative and Restorative Service Providers"

## 2015-01-23 DIAGNOSIS — R269 Unspecified abnormalities of gait and mobility: Secondary | ICD-10-CM | POA: Diagnosis not present

## 2015-01-23 DIAGNOSIS — Z89611 Acquired absence of right leg above knee: Secondary | ICD-10-CM | POA: Diagnosis not present

## 2015-01-23 DIAGNOSIS — R6889 Other general symptoms and signs: Secondary | ICD-10-CM | POA: Diagnosis not present

## 2015-01-23 NOTE — Therapy (Signed)
[facility] [address] [place], [place], [address] Phone: [phone]   Fax:  [phone]  Physical Therapy Treatment  Patient Details  Name: [name] MRN: [id number] Date of Birth: [birth year] Referring Provider:  Jani Gravel, MD  Encounter Date: 01/23/2015      PT End of Session - 01/23/15 1408    Visit Number 11  G code 1   Number of Visits 18   Date for PT Re-Evaluation 02/07/15   PT Start Time 1320   PT Stop Time 1405   PT Time Calculation (min) 45 min   Equipment Utilized During Treatment Gait belt   Activity Tolerance Patient tolerated treatment well   Behavior During Therapy [facility] for tasks assessed/performed      Past Medical History  Diagnosis Date  . Mitral regurgitation   . Hypertrophic obstructive cardiomyopathy     Required septal myotomy  . Urinary incontinence   . Cystitis   . Hemorrhoids   . Bronchitis   . Asthma   . Anemia   . Heart block     Complete heart block post surgical requiring pacer  . Hypokalemia     Postoperative, improved  . Hyponatremia     Mild postoperative, improved  . Blood transfusion   . Constipation   . Arthritis pain   . Cancer     lt breast  . Hypertension     Benign Essential Hypertension    . Current use of long term anticoagulation   . Chronic atrial fibrillation   . Arthritis   . Pacemaker     Past Surgical History  Procedure Laterality Date  . Anterior cervical diskectomy and fusion      C-7  . Septal myotomy    . Pacemaker placement  2006/11-27-2013    MDT dual chamber pacemaker implanted 2006 with gen change by Dr Lovena Le 11-27-2013  . Replacement total knee bilateral    . Femur fracture surgery      Right  . Wrist fracture surgery      Right  . Shoulder surgery      Right  . Breast biopsy      Left, negative  . Colonoscopy    . Abdominal hysterectomy    . Cardiomyopathy    . Breast lumpectomy  2012    left breast  . Joint  replacement    . Permanent pacemaker generator change N/A 11/27/2013    Procedure: PERMANENT PACEMAKER GENERATOR CHANGE;  Surgeon: Evans Lance, MD;  Location: [facility] CATH LAB;  Service: Cardiovascular;  Laterality: N/A;    There were no vitals taken for this visit.  Visit Diagnosis:  Abnormality of gait      Subjective Assessment - 01/23/15 1342    Symptoms The patient saw MD at [facility] yesterday.  He wrote down some recommendations for her prosthetist in preparation for her appt on 2/10.       PROSTHETIC TRAINING: Ambulation with RW and CGA on ramp, curb (with cues on correct technique), and 4 stairs x 2 reps with bilateral handrails and a step to pattern. Gait activities with RW and cues on R hip initiation to increase knee extension on prosthetic limb x 230 ft x 2 reps, 115 feet x 2 reps.  PT provided cues on step through gait and keeping RW consistently moving Parallel bars with side stepping 10 ft x 4 repetitions with CGA Gait in parallel bars with L UE support only and CGA with verbal cues  for knee control/stabilization Standing balance with emphasis on R pelvic upright/rotation                PT Short Term Goals - 01/23/15 1409    PT SHORT TERM GOAL #1   Title Donnes prosthesis correctly independently. (Target Date: 01/07/15)   Baseline Pt still needs cues/assist with sock ply management, although she reports donning her prosthesis indep.  She requires assist to remove prosthesis per reports.   Time 1   Period Months   Status Partially Met   PT SHORT TERM GOAL #2   Title Tolerates daily wear >8 hours /day without tenderness or skin issues. (Target Date: 01/07/15)   Baseline Pt reports wearing prosthesis >10 hours/day.   Time 1   Period Months   Status Achieved   PT SHORT TERM GOAL #3   Title ambulates 200' with walker & prosthesis modified independent. (Target Date: 01/07/15)   Baseline Pt is able to ambulate 200 ft with RW and prosthesis with close supervision.    Time 1   Period Months   Status Partially Met   PT SHORT TERM GOAL #4   Title negotiates ramp, curb with walker & stairs with 2 rails with prosthesis modified independent,. (Target Date: 01/07/15)   Baseline Pt requires supervision on ramp, curb and steps with prosthesis on 01/23/2015.   Time 1   Period Months   Status Partially Met   PT SHORT TERM GOAL #5   Title reaches 10" & rotates shoulders to look behind herself without UE support with supervision. (Target Date: 01/07/15)   Time 1   Period Months   Status On-going           PT Long Term Goals - 01/23/15 1410    PT LONG TERM GOAL #1   Title verbalizes & demonstrates proper prosthetic care. (Target Date: 02/06/15)   Time 2   Period Months   Status On-going   PT LONG TERM GOAL #2   Title tolerates wear >90% of awake hours without skin issues or tenderness. (Target Date: 02/06/15)   Time 2   Period Months   Status On-going   PT LONG TERM GOAL #3   Title ambulates >300' with LRAD & prosthesis modified independent. (Target Date: 02/06/15)   Time 2   Period Months   Status On-going   PT LONG TERM GOAL #4   Title negotiates ramp, curb & stairs with LRAD & prosthesis modified independent. (Target Date: 02/06/15)   Time 2   Period Months   Status On-going   PT LONG TERM GOAL #5   Title Berg Balance with prosthesis >36/56 (Target Date: 02/06/15)   Time 2   Period Months   Status On-going               Plan - 01/23/15 1412    Clinical Impression Statement Patient progressing towards STG for ramp, curb and stair negotiation.  She still requires supervision and v. cue for sequencing to ascend curb correctly.  The patient is improving standing tolerance, balance with prosthesis donned, and control of knee with gait activities.  Continue to STG/LTGs.   PT Next Visit Plan continue improving prosthetic knee control, hip stretching/awareness, progress gait training   Consulted and Agree with Plan of Care Patient         Problem List Patient Active Problem List   Diagnosis Date Noted  . Unilateral AKA 09/23/2014  . Preop cardiovascular exam 01/16/2014  . Atrioventricular block, complete 07/03/2013  .  Atrial fibrillation 07/03/2013  . Breast cancer, left breast 04/24/2013  . Other symptoms involving cardiovascular system 12/22/2010  . PACEMAKER, PERMANENT 06/21/2010  . MITRAL REGURGITATION 06/17/2010  . HYPERTENSION 06/17/2010  . HOCM (hypertrophic obstructive cardiomyopathy) 06/17/2010  . HEART BLOCK 06/17/2010  . HEMORRHOIDS 06/17/2010  . BRONCHITIS 06/17/2010  . ASTHMA 06/17/2010  . CYSTITIS 06/17/2010  . CHEST PAIN 06/17/2010  . URINARY INCONTINENCE 06/17/2010    Milia Warth, PT 01/23/2015, 2:14 PM  [facility] [address] [place], [place], [address] Phone: [phone]   Fax:  [phone]

## 2015-01-27 ENCOUNTER — Ambulatory Visit: Payer: Medicare Other | Admitting: Physical Therapy

## 2015-01-27 ENCOUNTER — Encounter: Payer: Self-pay | Admitting: Physical Therapy

## 2015-01-27 DIAGNOSIS — Z89611 Acquired absence of right leg above knee: Secondary | ICD-10-CM | POA: Diagnosis not present

## 2015-01-27 DIAGNOSIS — R6889 Other general symptoms and signs: Secondary | ICD-10-CM | POA: Diagnosis not present

## 2015-01-27 DIAGNOSIS — R269 Unspecified abnormalities of gait and mobility: Secondary | ICD-10-CM

## 2015-01-27 NOTE — Therapy (Signed)
[facility] [address] [place], [place], [address] Phone: [phone]   Fax:  [phone]  Physical Therapy Treatment  Patient Details  Name: [name] MRN: [id number] Date of Birth: April 17, 1931 Referring Provider:  Jani Gravel, MD  Encounter Date: 01/27/2015      PT End of Session - 01/27/15 1315    Visit Number 12  G code 1   Number of Visits 18   Date for PT Re-Evaluation 02/07/15   PT Start Time 1315   PT Stop Time 1400   PT Time Calculation (min) 45 min   Equipment Utilized During Treatment Gait belt   Activity Tolerance Patient tolerated treatment well   Behavior During Therapy [facility] for tasks assessed/performed      Past Medical History  Diagnosis Date  . Mitral regurgitation   . Hypertrophic obstructive cardiomyopathy     Required septal myotomy  . Urinary incontinence   . Cystitis   . Hemorrhoids   . Bronchitis   . Asthma   . Anemia   . Heart block     Complete heart block post surgical requiring pacer  . Hypokalemia     Postoperative, improved  . Hyponatremia     Mild postoperative, improved  . Blood transfusion   . Constipation   . Arthritis pain   . Cancer     lt breast  . Hypertension     Benign Essential Hypertension    . Current use of long term anticoagulation   . Chronic atrial fibrillation   . Arthritis   . Pacemaker     Past Surgical History  Procedure Laterality Date  . Anterior cervical diskectomy and fusion      C-7  . Septal myotomy    . Pacemaker placement  2006/11-27-2013    MDT dual chamber pacemaker implanted 2006 with gen change by Dr Lovena Le 11-27-2013  . Replacement total knee bilateral    . Femur fracture surgery      Right  . Wrist fracture surgery      Right  . Shoulder surgery      Right  . Breast biopsy      Left, negative  . Colonoscopy    . Abdominal hysterectomy    . Cardiomyopathy    . Breast lumpectomy  2012    left breast  . Joint  replacement    . Permanent pacemaker generator change N/A 11/27/2013    Procedure: PERMANENT PACEMAKER GENERATOR CHANGE;  Surgeon: Evans Lance, MD;  Location: [facility] CATH LAB;  Service: Cardiovascular;  Laterality: N/A;    There were no vitals taken for this visit.  Visit Diagnosis:  Abnormality of gait  Activity intolerance  Status post above knee amputation of right lower extremity  [facility] recommended: 1) dorsiflex foot 2) strengthen extensor assist 3) pylon has lateral lean Scheduled to see prosthetist on 01/28/15. Prosthetic Training: Patient ambulated 100' with rolling walker & prosthesis with supervision with verbal & tactile cues to use pelvis to advance prosthesis.   PT & patient discussed benefits to using a cane [facility]) in home to free one UE to carry items and rolling walker for community to improve safety, longer distances and negotiate barriers. Patient verbalized understanding. Patient ambulated 46' and 31' around furniture with [facility] with moderate assist (initial 15-20' required hand hold assist). PT verbally & tactile cueing advancing prosthesis with pelvic motion. Patient negotiated ramp & curb with RW & prosthesis with supervision. Patient negotiated 4 steps  with 2 hands on left rail & 2 hands on right rail sideways with cues & supervision. Patient ambulated 100' to lobby with RW & prosthesis with tactile /verbal cues on posture & to use pelvis to advance prosthesis with improved clearance.                        PT Education - 01/27/15 1510    Education provided Yes   Education Details SBQC vs LBQC, what changes recommended will do to improve her mobility   Person(s) Educated Patient   Methods Explanation   Comprehension Verbalized understanding          PT Short Term Goals - 01/23/15 1409    PT SHORT TERM GOAL #1   Title Donnes prosthesis correctly independently. (Target Date: 01/07/15)   Baseline Pt still needs cues/assist with sock  ply management, although she reports donning her prosthesis indep.  She requires assist to remove prosthesis per reports.   Time 1   Period Months   Status Partially Met   PT SHORT TERM GOAL #2   Title Tolerates daily wear >8 hours /day without tenderness or skin issues. (Target Date: 01/07/15)   Baseline Pt reports wearing prosthesis >10 hours/day.   Time 1   Period Months   Status Achieved   PT SHORT TERM GOAL #3   Title ambulates 200' with walker & prosthesis modified independent. (Target Date: 01/07/15)   Baseline Pt is able to ambulate 200 ft with RW and prosthesis with close supervision.   Time 1   Period Months   Status Partially Met   PT SHORT TERM GOAL #4   Title negotiates ramp, curb with walker & stairs with 2 rails with prosthesis modified independent,. (Target Date: 01/07/15)   Baseline Pt requires supervision on ramp, curb and steps with prosthesis on 01/23/2015.   Time 1   Period Months   Status Partially Met   PT SHORT TERM GOAL #5   Title reaches 10" & rotates shoulders to look behind herself without UE support with supervision. (Target Date: 01/07/15)   Time 1   Period Months   Status On-going           PT Long Term Goals - 01/23/15 1410    PT LONG TERM GOAL #1   Title verbalizes & demonstrates proper prosthetic care. (Target Date: 02/06/15)   Time 2   Period Months   Status On-going   PT LONG TERM GOAL #2   Title tolerates wear >90% of awake hours without skin issues or tenderness. (Target Date: 02/06/15)   Time 2   Period Months   Status On-going   PT LONG TERM GOAL #3   Title ambulates >300' with LRAD & prosthesis modified independent. (Target Date: 02/06/15)   Time 2   Period Months   Status On-going   PT LONG TERM GOAL #4   Title negotiates ramp, curb & stairs with LRAD & prosthesis modified independent. (Target Date: 02/06/15)   Time 2   Period Months   Status On-going   PT LONG TERM GOAL #5   Title Berg Balance with prosthesis >36/56 (Target Date:  02/06/15)   Time 2   Period Months   Status On-going               Plan - 01/27/15 1315    Clinical Impression Statement Patient has potential to use a SBQC for household gait & activities to improve independence but needs more skilled care.  Patient improved ability to clear prosthesis with instruction & practice. She also improved on negotiating barriers.   Pt will benefit from skilled therapeutic intervention in order to improve on the following deficits Abnormal gait;Decreased activity tolerance;Decreased balance;Decreased endurance;Decreased knowledge of use of DME;Decreased mobility;Decreased safety awareness;Decreased strength;Other (comment)   PT Frequency 2x / week   PT Treatment/Interventions ADLs/Self Care Home Management;DME Instruction;Gait training;Stair training;Functional mobility training;Therapeutic activities;Therapeutic exercise;Balance training;Neuromuscular re-education;Patient/family education;Other (comment)   PT Next Visit Plan continue household gait with [facility] & community with RW   Consulted and Agree with Plan of Care Patient        Problem List Patient Active Problem List   Diagnosis Date Noted  . Unilateral AKA 09/23/2014  . Preop cardiovascular exam 01/16/2014  . Atrioventricular block, complete 07/03/2013  . Atrial fibrillation 07/03/2013  . Breast cancer, left breast 04/24/2013  . Other symptoms involving cardiovascular system 12/22/2010  . PACEMAKER, PERMANENT 06/21/2010  . MITRAL REGURGITATION 06/17/2010  . HYPERTENSION 06/17/2010  . HOCM (hypertrophic obstructive cardiomyopathy) 06/17/2010  . HEART BLOCK 06/17/2010  . HEMORRHOIDS 06/17/2010  . BRONCHITIS 06/17/2010  . ASTHMA 06/17/2010  . CYSTITIS 06/17/2010  . CHEST PAIN 06/17/2010  . URINARY INCONTINENCE 06/17/2010    Jamey Reas PT, DPT 01/27/2015, 3:35 PM  [facility] [address] [place], [place], [address] Phone:  [phone]   Fax:  [phone]

## 2015-01-30 ENCOUNTER — Ambulatory Visit: Payer: Medicare Other | Admitting: Rehabilitative and Restorative Service Providers"

## 2015-01-30 DIAGNOSIS — Z89611 Acquired absence of right leg above knee: Secondary | ICD-10-CM | POA: Diagnosis not present

## 2015-01-30 DIAGNOSIS — R6889 Other general symptoms and signs: Secondary | ICD-10-CM | POA: Diagnosis not present

## 2015-01-30 DIAGNOSIS — R269 Unspecified abnormalities of gait and mobility: Secondary | ICD-10-CM | POA: Diagnosis not present

## 2015-01-30 NOTE — Therapy (Signed)
[facility] [address] [place], [place], [address] Phone: [phone]   Fax:  [phone]  Physical Therapy Treatment  Patient Details  Name: [name] MRN: [id number] Date of Birth: [birth year] Referring Provider:  Jani Gravel, MD  Encounter Date: 01/30/2015      PT End of Session - 01/30/15 1412    Visit Number 13  G code 3   Number of Visits 18   Date for PT Re-Evaluation 02/07/15   PT Start Time 1324   PT Stop Time 1405   PT Time Calculation (min) 41 min   Equipment Utilized During Treatment Gait belt   Activity Tolerance Patient tolerated treatment well   Behavior During Therapy [facility] for tasks assessed/performed      Past Medical History  Diagnosis Date  . Mitral regurgitation   . Hypertrophic obstructive cardiomyopathy     Required septal myotomy  . Urinary incontinence   . Cystitis   . Hemorrhoids   . Bronchitis   . Asthma   . Anemia   . Heart block     Complete heart block post surgical requiring pacer  . Hypokalemia     Postoperative, improved  . Hyponatremia     Mild postoperative, improved  . Blood transfusion   . Constipation   . Arthritis pain   . Cancer     lt breast  . Hypertension     Benign Essential Hypertension    . Current use of long term anticoagulation   . Chronic atrial fibrillation   . Arthritis   . Pacemaker     Past Surgical History  Procedure Laterality Date  . Anterior cervical diskectomy and fusion      C-7  . Septal myotomy    . Pacemaker placement  2006/11-27-2013    MDT dual chamber pacemaker implanted 2006 with gen change by Dr Lovena Le 11-27-2013  . Replacement total knee bilateral    . Femur fracture surgery      Right  . Wrist fracture surgery      Right  . Shoulder surgery      Right  . Breast biopsy      Left, negative  . Colonoscopy    . Abdominal hysterectomy    . Cardiomyopathy    . Breast lumpectomy  2012    left breast  . Joint  replacement    . Permanent pacemaker generator change N/A 11/27/2013    Procedure: PERMANENT PACEMAKER GENERATOR CHANGE;  Surgeon: Evans Lance, MD;  Location: [facility] CATH LAB;  Service: Cardiovascular;  Laterality: N/A;    There were no vitals taken for this visit.  Visit Diagnosis:  Abnormality of gait      Subjective Assessment - 01/30/15 1416    Symptoms The patient reports she did not do well with the quad cane last session.     Currently in Pain? No/denies     PROSTHETIC TRAINING: The patient ambulates with RW 100 ft with multiple episodes of minA due to R prosthesis not clearing during swing phase of gait.  Gait with RW x 230 ft with cues R hip initiation to improve R anterior translation of pelvic over femur to increase R prosthetic extension. Gait with LBQC x 40 ft with min A with cues on sequencing and pelvic initiation Ramp and curb with RW with CGA to SBA with cues on technique Standing in parallel bars with sidestepping x 10 ft x 4 repetitions Standing in parallel bars with weight shift  to the right LE moving L LE into/out of stride position Gait with RW x 100 ft at end of session with improved clearance of R LE during gait with SBA       PT Short Term Goals - 01/23/15 1409    PT SHORT TERM GOAL #1   Title Donnes prosthesis correctly independently. (Target Date: 01/07/15)   Baseline Pt still needs cues/assist with sock ply management, although she reports donning her prosthesis indep.  She requires assist to remove prosthesis per reports.   Time 1   Period Months   Status Partially Met   PT SHORT TERM GOAL #2   Title Tolerates daily wear >8 hours /day without tenderness or skin issues. (Target Date: 01/07/15)   Baseline Pt reports wearing prosthesis >10 hours/day.   Time 1   Period Months   Status Achieved   PT SHORT TERM GOAL #3   Title ambulates 200' with walker & prosthesis modified independent. (Target Date: 01/07/15)   Baseline Pt is able to ambulate 200 ft with  RW and prosthesis with close supervision.   Time 1   Period Months   Status Partially Met   PT SHORT TERM GOAL #4   Title negotiates ramp, curb with walker & stairs with 2 rails with prosthesis modified independent,. (Target Date: 01/07/15)   Baseline Pt requires supervision on ramp, curb and steps with prosthesis on 01/23/2015.   Time 1   Period Months   Status Partially Met   PT SHORT TERM GOAL #5   Title reaches 10" & rotates shoulders to look behind herself without UE support with supervision. (Target Date: 01/07/15)   Time 1   Period Months   Status On-going           PT Long Term Goals - 01/23/15 1410    PT LONG TERM GOAL #1   Title verbalizes & demonstrates proper prosthetic care. (Target Date: 02/06/15)   Time 2   Period Months   Status On-going   PT LONG TERM GOAL #2   Title tolerates wear >90% of awake hours without skin issues or tenderness. (Target Date: 02/06/15)   Time 2   Period Months   Status On-going   PT LONG TERM GOAL #3   Title ambulates >300' with LRAD & prosthesis modified independent. (Target Date: 02/06/15)   Time 2   Period Months   Status On-going   PT LONG TERM GOAL #4   Title negotiates ramp, curb & stairs with LRAD & prosthesis modified independent. (Target Date: 02/06/15)   Time 2   Period Months   Status On-going   PT LONG TERM GOAL #5   Title Berg Balance with prosthesis >36/56 (Target Date: 02/06/15)   Time 2   Period Months   Status On-going               Plan - 01/30/15 1412    Clinical Impression Statement The patient had improved gait with [facility] today to being progressing towards less restrictive device for home, however she continues to need cues to ensure R heel strike when using RW or LBQC.  PT to continue progressing gait activities, balance and negotiatoin of community surfaces.   PT Next Visit Plan continue household gait with [facility] & community with RW-- add HEP (sidestep at countertop, standing at counter, weight shifting  near supportive surface)   Consulted and Agree with Plan of Care Patient        Problem List Patient Active Problem List  Diagnosis Date Noted  . Unilateral AKA 09/23/2014  . Preop cardiovascular exam 01/16/2014  . Atrioventricular block, complete 07/03/2013  . Atrial fibrillation 07/03/2013  . Breast cancer, left breast 04/24/2013  . Other symptoms involving cardiovascular system 12/22/2010  . PACEMAKER, PERMANENT 06/21/2010  . MITRAL REGURGITATION 06/17/2010  . HYPERTENSION 06/17/2010  . HOCM (hypertrophic obstructive cardiomyopathy) 06/17/2010  . HEART BLOCK 06/17/2010  . HEMORRHOIDS 06/17/2010  . BRONCHITIS 06/17/2010  . ASTHMA 06/17/2010  . CYSTITIS 06/17/2010  . CHEST PAIN 06/17/2010  . URINARY INCONTINENCE 06/17/2010    Aasir Daigler, PT 01/30/2015, 2:16 PM  [facility] [address] [place], [place], [address] Phone: [phone]   Fax:  [phone]

## 2015-02-03 ENCOUNTER — Ambulatory Visit: Payer: Medicare Other | Admitting: Physical Therapy

## 2015-02-03 ENCOUNTER — Encounter: Payer: Self-pay | Admitting: Physical Therapy

## 2015-02-03 DIAGNOSIS — R6889 Other general symptoms and signs: Secondary | ICD-10-CM | POA: Diagnosis not present

## 2015-02-03 DIAGNOSIS — R269 Unspecified abnormalities of gait and mobility: Secondary | ICD-10-CM

## 2015-02-03 DIAGNOSIS — Z89611 Acquired absence of right leg above knee: Secondary | ICD-10-CM

## 2015-02-03 NOTE — Therapy (Signed)
[facility] [address] [place], [place], [address] Phone: [phone]   Fax:  [phone]  Physical Therapy Treatment  Patient Details  Name: [name] MRN: [id number] Date of Birth: Jul 18, 1931 Referring Provider:  Jani Gravel, MD  Encounter Date: 02/03/2015      PT End of Session - 02/03/15 1708    Visit Number 14  G code 3   Number of Visits 18   Date for PT Re-Evaluation 02/07/15   PT Start Time 1315   PT Stop Time 1400   PT Time Calculation (min) 45 min   Equipment Utilized During Treatment Gait belt   Activity Tolerance Patient tolerated treatment well   Behavior During Therapy [facility] for tasks assessed/performed      Past Medical History  Diagnosis Date  . Mitral regurgitation   . Hypertrophic obstructive cardiomyopathy     Required septal myotomy  . Urinary incontinence   . Cystitis   . Hemorrhoids   . Bronchitis   . Asthma   . Anemia   . Heart block     Complete heart block post surgical requiring pacer  . Hypokalemia     Postoperative, improved  . Hyponatremia     Mild postoperative, improved  . Blood transfusion   . Constipation   . Arthritis pain   . Cancer     lt breast  . Hypertension     Benign Essential Hypertension    . Current use of long term anticoagulation   . Chronic atrial fibrillation   . Arthritis   . Pacemaker     Past Surgical History  Procedure Laterality Date  . Anterior cervical diskectomy and fusion      C-7  . Septal myotomy    . Pacemaker placement  2006/11-27-2013    MDT dual chamber pacemaker implanted 2006 with gen change by Dr Lovena Le 11-27-2013  . Replacement total knee bilateral    . Femur fracture surgery      Right  . Wrist fracture surgery      Right  . Shoulder surgery      Right  . Breast biopsy      Left, negative  . Colonoscopy    . Abdominal hysterectomy    . Cardiomyopathy    . Breast lumpectomy  2012    left breast  . Joint  replacement    . Permanent pacemaker generator change N/A 11/27/2013    Procedure: PERMANENT PACEMAKER GENERATOR CHANGE;  Surgeon: Evans Lance, MD;  Location: [facility] CATH LAB;  Service: Cardiovascular;  Laterality: N/A;    There were no vitals taken for this visit.  Visit Diagnosis:  Abnormality of gait - Plan: PT plan of care cert/re-cert  Activity intolerance - Plan: PT plan of care cert/re-cert  Status post above knee amputation of right lower extremity - Plan: PT plan of care cert/re-cert      Subjective Assessment - 02/03/15 1320    Symptoms No falls. Prosthetist now seeing her every 4 weeks.   Currently in Pain? No/denies     Prosthetic Training:  Patient ambulated 200' with rolling walker & prosthesis with cues on posture to facilitate better clearance of prosthesis. PT tried a 1/4" lift in left leg which improved clearance but caused Trendelenburg and adduction of prosthesis hitting feet. PT removed lift and simulated a leather toe cap on prosthesis. Patient ambulated 150' with improved clearance.  Patient negotiated stairs with 2 hands on right rail sidestepping with supervision. Patient  negotiated ramp & curb with RW & prosthesis with supervision & verbal cues on technique. Patient ambulated with [facility] 75' with 30' on carpet with minA then fatigued so required modA / hand hold for last 45'. Patient ambulated 67' with SBQC carrying cup with minimal assist. Patient ambulated 100' to lobby with rolling walker with cues on posture & pelvic motion for improved clearance.                        PT Education - 02/03/15 1707    Education provided Yes   Education Details correlation between posture & clearance of prosthesis, sitting technique to reduce risk of fractures with osteoporosis   Person(s) Educated Patient   Methods Explanation;Demonstration   Comprehension Verbalized understanding          PT Short Term Goals - 01/23/15 1409    PT SHORT TERM GOAL #1    Title Donnes prosthesis correctly independently. (Target Date: 01/07/15)   Baseline Pt still needs cues/assist with sock ply management, although she reports donning her prosthesis indep.  She requires assist to remove prosthesis per reports.   Time 1   Period Months   Status Partially Met   PT SHORT TERM GOAL #2   Title Tolerates daily wear >8 hours /day without tenderness or skin issues. (Target Date: 01/07/15)   Baseline Pt reports wearing prosthesis >10 hours/day.   Time 1   Period Months   Status Achieved   PT SHORT TERM GOAL #3   Title ambulates 200' with walker & prosthesis modified independent. (Target Date: 01/07/15)   Baseline Pt is able to ambulate 200 ft with RW and prosthesis with close supervision.   Time 1   Period Months   Status Partially Met   PT SHORT TERM GOAL #4   Title negotiates ramp, curb with walker & stairs with 2 rails with prosthesis modified independent,. (Target Date: 01/07/15)   Baseline Pt requires supervision on ramp, curb and steps with prosthesis on 01/23/2015.   Time 1   Period Months   Status Partially Met   PT SHORT TERM GOAL #5   Title reaches 10" & rotates shoulders to look behind herself without UE support with supervision. (Target Date: 01/07/15)   Time 1   Period Months   Status On-going           PT Long Term Goals - 02/03/15 1708    PT LONG TERM GOAL #1   Title verbalizes & demonstrates proper prosthetic care. (NEW Target Date: 03/06/15)   Baseline 02/03/15 Patient needs cues to donne without rotation and adjusting ply socks.   Time 2   Period Months   Status On-going   PT LONG TERM GOAL #2   Title tolerates wear >90% of awake hours without skin issues or tenderness. (NEW Target Date: 03/06/15)   Baseline 02/03/15 Partially MET Patient tolerates wear >90% of awake hours with no skin issues but some tenderness.   Time 2   Period Months   Status On-going   PT LONG TERM GOAL #3   Title ambulates >300' with LRAD & prosthesis modified  independent. (NEW Target Date: 03/06/15)   Baseline 02/03/15 Patient ambulates up to 200' with rolling walker & prosthesis with supervision due to poor clearance at times.    Time 2   Period Months   Status On-going   PT LONG TERM GOAL #4   Title negotiates ramp, curb & stairs with LRAD & prosthesis modified independent. (NEW  Target Date: 03/06/15)   Baseline 02/03/15 Patient negotiates ramp & curb with rolling walker & prosthesis and stairs with 2 hands on 1 rail with supervision / cues for safety.    Time 2   Period Months   Status On-going   PT LONG TERM GOAL #5   Title Berg Balance with prosthesis >36/56 (Target Date: 02/06/15)   Time 2   Period Months   Status On-going   Additional Long Term Goals   Additional Long Term Goals Yes   PT LONG TERM GOAL #6   Title Patient ambulates 44' with [facility] & prosthesis carrying cup of water modified independent. (Target Date: 03/06/15)   Time 1   Period Months   Status New               Plan - 02/03/15 1715    Clinical Impression Statement Patient has made progress towards LTGs but not fully met any of LTGs. See progress noted on each LTG. She has potential to ambulate with rolling walker in community & limited household with [facility] with additional skilled care.    PT Frequency 2x / week   PT Duration 4 weeks   PT Next Visit Plan Assess Berg Balance LTG, continue household gait with [facility] & community with RW-- add HEP (sidestep at countertop, standing at counter, weight shifting near supportive surface)   Consulted and Agree with Plan of Care Patient        Problem List Patient Active Problem List   Diagnosis Date Noted  . Unilateral AKA 09/23/2014  . Preop cardiovascular exam 01/16/2014  . Atrioventricular block, complete 07/03/2013  . Atrial fibrillation 07/03/2013  . Breast cancer, left breast 04/24/2013  . Other symptoms involving cardiovascular system 12/22/2010  . PACEMAKER, PERMANENT 06/21/2010  . MITRAL REGURGITATION  06/17/2010  . HYPERTENSION 06/17/2010  . HOCM (hypertrophic obstructive cardiomyopathy) 06/17/2010  . HEART BLOCK 06/17/2010  . HEMORRHOIDS 06/17/2010  . BRONCHITIS 06/17/2010  . ASTHMA 06/17/2010  . CYSTITIS 06/17/2010  . CHEST PAIN 06/17/2010  . URINARY INCONTINENCE 06/17/2010    Jamey Reas PT, DPT Physical Therapist Specializing in Prosthetics 02/03/2015, 5:21 PM  [facility] [address] [place], [place], [address] Phone: [phone]   Fax:  [phone]

## 2015-02-06 ENCOUNTER — Ambulatory Visit: Payer: Medicare Other | Admitting: Rehabilitative and Restorative Service Providers"

## 2015-02-06 DIAGNOSIS — R269 Unspecified abnormalities of gait and mobility: Secondary | ICD-10-CM | POA: Diagnosis not present

## 2015-02-06 DIAGNOSIS — R6889 Other general symptoms and signs: Secondary | ICD-10-CM | POA: Diagnosis not present

## 2015-02-06 DIAGNOSIS — Z89611 Acquired absence of right leg above knee: Secondary | ICD-10-CM | POA: Diagnosis not present

## 2015-02-06 NOTE — Patient Instructions (Signed)
  AT [place] AND PLACE FEET EQUAL DISTANCE FROM THE MIDLINE.  You also should try to feel with your limb pressure in socket.  You are trying to feel with limb what you used to feel with the bottom of your foot.  1. Overhead/Upward Reaching: alternated reaching up to top cabinets or ceiling if no cabinets present. Keep equal weight on each leg. Start with one hand support on counter while other hand reaches and progress to no hand support with reaching.  *Only reach with left arm*  Side-Stepping   HOLD THE COUNTERTOP FOR SAFETY.  Walk to left side along counter. Repeat in opposite direction. Repat 3 or 4 times along your countertop.   Copyright  VHI. All rights reserved.  PRE GAIT: Standing Weight Shift   HOLD ONTO COUNTERTOP.  Stand with upright posture, shift weight from side to side. _10__ reps per set. Hold onto a support.  Copyright  VHI. All rights reserved.  Weight Shift: Anterior / Posterior (Limits of Stability)   Hold onto countertop.  Shift your weight forward and backwards working on balance control.  Repeat 10 times, 1 time/day.  Copyright  VHI. All rights reserved.

## 2015-02-06 NOTE — Therapy (Signed)
[facility] [address] [place], [place], [address] Phone: [phone]   Fax:  [phone]  Physical Therapy Treatment  Patient Details  Name: [name] MRN: [id number] Date of Birth: 07-02-1931 Referring Provider:  Jani Gravel, MD  Encounter Date: 02/06/2015      PT End of Session - 02/06/15 1714    Visit Number 15  G code 5   Number of Visits 18   Date for PT Re-Evaluation 02/07/15   PT Start Time 1320   PT Stop Time 1405   PT Time Calculation (min) 45 min   Equipment Utilized During Treatment Gait belt   Activity Tolerance Patient tolerated treatment well   Behavior During Therapy [facility] for tasks assessed/performed      Past Medical History  Diagnosis Date  . Mitral regurgitation   . Hypertrophic obstructive cardiomyopathy     Required septal myotomy  . Urinary incontinence   . Cystitis   . Hemorrhoids   . Bronchitis   . Asthma   . Anemia   . Heart block     Complete heart block post surgical requiring pacer  . Hypokalemia     Postoperative, improved  . Hyponatremia     Mild postoperative, improved  . Blood transfusion   . Constipation   . Arthritis pain   . Cancer     lt breast  . Hypertension     Benign Essential Hypertension    . Current use of long term anticoagulation   . Chronic atrial fibrillation   . Arthritis   . Pacemaker     Past Surgical History  Procedure Laterality Date  . Anterior cervical diskectomy and fusion      C-7  . Septal myotomy    . Pacemaker placement  2006/11-27-2013    MDT dual chamber pacemaker implanted 2006 with gen change by Dr Lovena Le 11-27-2013  . Replacement total knee bilateral    . Femur fracture surgery      Right  . Wrist fracture surgery      Right  . Shoulder surgery      Right  . Breast biopsy      Left, negative  . Colonoscopy    . Abdominal hysterectomy    . Cardiomyopathy    . Breast lumpectomy  2012    left breast  . Joint  replacement    . Permanent pacemaker generator change N/A 11/27/2013    Procedure: PERMANENT PACEMAKER GENERATOR CHANGE;  Surgeon: Evans Lance, MD;  Location: [facility] CATH LAB;  Service: Cardiovascular;  Laterality: N/A;    There were no vitals taken for this visit.  Visit Diagnosis:  Abnormality of gait      Subjective Assessment - 02/06/15 1330    Symptoms The patient reports she is walking at home and thinks she looks up more in her home environment.   Currently in Pain? No/denies     PROSTHETIC TRAINING: Gait training with RW x 100 ft x 3 reps with postural cues and supervision with occasional CGA (3 episodes of R foot catching during gait with RW activities). Ambulation with LBQC x 60 ft with R HHA to come up to standing (pt reports more nervous today with standing with [facility]) and during gait activities. Standing exercises at countertop per HEP: sidestepping, weight shifting and UE reaching.     [facility] PT Assessment - 02/06/15 1348    Berg Balance Test   Sit to Stand Needs minimal aid to stand or  to stabilize   Standing Unsupported Able to stand 2 minutes with supervision   Sitting with Back Unsupported but Feet Supported on Floor or Stool Able to sit safely and securely 2 minutes   Stand to Sit Controls descent by using hands   Transfers Able to transfer safely, definite need of hands   Standing Unsupported with Eyes Closed Able to stand 3 seconds   Standing Ubsupported with Feet Together Needs help to attain position but able to stand for 30 seconds with feet together   From Standing, Reach Forward with Outstretched Arm Reaches forward but needs supervision   From Standing Position, Pick up Object from Floor Unable to try/needs assist to keep balance   From Standing Position, Turn to Look Behind Over each Shoulder Needs supervision when turning   Turn 360 Degrees Needs assistance while turning   Standing Unsupported, Alternately Place Feet on Step/Stool Needs assistance to keep  from falling or unable to try   Standing Unsupported, One Foot in Front Loses balance while stepping or standing   Standing on One Leg Unable to try or needs assist to prevent fall   Total Score 19             PT Education - 02/06/15 1343    Education provided Yes   Education Details HEP: Sidestepping, weight shifting anterior/posterio, and standing L UE reaching to cabinets for balance.   Person(s) Educated Patient   Methods Explanation;Demonstration;Handout   Comprehension Returned demonstration;Verbalized understanding          PT Short Term Goals - 01/23/15 1409    PT SHORT TERM GOAL #1   Title Donnes prosthesis correctly independently. (Target Date: 01/07/15)   Baseline Pt still needs cues/assist with sock ply management, although she reports donning her prosthesis indep.  She requires assist to remove prosthesis per reports.   Time 1   Period Months   Status Partially Met   PT SHORT TERM GOAL #2   Title Tolerates daily wear >8 hours /day without tenderness or skin issues. (Target Date: 01/07/15)   Baseline Pt reports wearing prosthesis >10 hours/day.   Time 1   Period Months   Status Achieved   PT SHORT TERM GOAL #3   Title ambulates 200' with walker & prosthesis modified independent. (Target Date: 01/07/15)   Baseline Pt is able to ambulate 200 ft with RW and prosthesis with close supervision.   Time 1   Period Months   Status Partially Met   PT SHORT TERM GOAL #4   Title negotiates ramp, curb with walker & stairs with 2 rails with prosthesis modified independent,. (Target Date: 01/07/15)   Baseline Pt requires supervision on ramp, curb and steps with prosthesis on 01/23/2015.   Time 1   Period Months   Status Partially Met   PT SHORT TERM GOAL #5   Title reaches 10" & rotates shoulders to look behind herself without UE support with supervision. (Target Date: 01/07/15)   Time 1   Period Months   Status On-going           PT Long Term Goals - 02/03/15 1708     PT LONG TERM GOAL #1   Title verbalizes & demonstrates proper prosthetic care. (NEW Target Date: 03/06/15)   Baseline 02/03/15 Patient needs cues to donne without rotation and adjusting ply socks.   Time 2   Period Months   Status On-going   PT LONG TERM GOAL #2   Title tolerates wear >90% of awake  hours without skin issues or tenderness. (NEW Target Date: 03/06/15)   Baseline 02/03/15 Partially MET Patient tolerates wear >90% of awake hours with no skin issues but some tenderness.   Time 2   Period Months   Status On-going   PT LONG TERM GOAL #3   Title ambulates >300' with LRAD & prosthesis modified independent. (NEW Target Date: 03/06/15)   Baseline 02/03/15 Patient ambulates up to 200' with rolling walker & prosthesis with supervision due to poor clearance at times.    Time 2   Period Months   Status On-going   PT LONG TERM GOAL #4   Title negotiates ramp, curb & stairs with LRAD & prosthesis modified independent. (NEW Target Date: 03/06/15)   Baseline 02/03/15 Patient negotiates ramp & curb with rolling walker & prosthesis and stairs with 2 hands on 1 rail with supervision / cues for safety.    Time 2   Period Months   Status On-going   PT LONG TERM GOAL #5   Title Berg Balance with prosthesis >36/56 (Target Date: 02/06/15)   Time 2   Period Months   Status On-going   Additional Long Term Goals   Additional Long Term Goals Yes   PT LONG TERM GOAL #6   Title Patient ambulates 69' with [facility] & prosthesis carrying cup of water modified independent. (Target Date: 03/06/15)   Time 1   Period Months   Status New               Plan - 02/06/15 1715    Clinical Impression Statement The patient made minimal progress on Berg from 17/56 to 19/56.  She has decreased confidence when standing with prosthesis without UE support. PT added HEP for standing near countertop to improve balance control for mobility.   PT Next Visit Plan Gait training: community with RW and try [facility] household  (pt still requires assist)   Consulted and Agree with Plan of Care Patient        Problem List Patient Active Problem List   Diagnosis Date Noted  . Unilateral AKA 09/23/2014  . Preop cardiovascular exam 01/16/2014  . Atrioventricular block, complete 07/03/2013  . Atrial fibrillation 07/03/2013  . Breast cancer, left breast 04/24/2013  . Other symptoms involving cardiovascular system 12/22/2010  . PACEMAKER, PERMANENT 06/21/2010  . MITRAL REGURGITATION 06/17/2010  . HYPERTENSION 06/17/2010  . HOCM (hypertrophic obstructive cardiomyopathy) 06/17/2010  . HEART BLOCK 06/17/2010  . HEMORRHOIDS 06/17/2010  . BRONCHITIS 06/17/2010  . ASTHMA 06/17/2010  . CYSTITIS 06/17/2010  . CHEST PAIN 06/17/2010  . URINARY INCONTINENCE 06/17/2010    Haedyn Breau, PT 02/06/2015, 5:17 PM  [place] [address] [place], [place], [address] Phone: [phone]   Fax:  [phone]

## 2015-02-10 ENCOUNTER — Encounter: Payer: Self-pay | Admitting: Physical Therapy

## 2015-02-10 ENCOUNTER — Ambulatory Visit: Payer: Medicare Other | Admitting: Physical Therapy

## 2015-02-10 DIAGNOSIS — R2689 Other abnormalities of gait and mobility: Secondary | ICD-10-CM

## 2015-02-10 DIAGNOSIS — Z89611 Acquired absence of right leg above knee: Secondary | ICD-10-CM

## 2015-02-10 DIAGNOSIS — R6889 Other general symptoms and signs: Secondary | ICD-10-CM | POA: Diagnosis not present

## 2015-02-10 DIAGNOSIS — R269 Unspecified abnormalities of gait and mobility: Secondary | ICD-10-CM

## 2015-02-10 NOTE — Therapy (Signed)
[facility] [address] [place], [place], [address] Phone: [phone]   Fax:  [phone]  Physical Therapy Treatment  Patient Details  Name: [name] MRN: [id number] Date of Birth: 1931-09-15 Referring Provider:  Jani Gravel, MD  Encounter Date: 02/10/2015      PT End of Session - 02/10/15 1015    Visit Number 16  G code 6   Number of Visits 18   Date for PT Re-Evaluation 02/07/15   PT Start Time 1015   PT Stop Time 1100   PT Time Calculation (min) 45 min   Equipment Utilized During Treatment Gait belt   Activity Tolerance Patient tolerated treatment well   Behavior During Therapy [facility] for tasks assessed/performed      Past Medical History  Diagnosis Date  . Mitral regurgitation   . Hypertrophic obstructive cardiomyopathy     Required septal myotomy  . Urinary incontinence   . Cystitis   . Hemorrhoids   . Bronchitis   . Asthma   . Anemia   . Heart block     Complete heart block post surgical requiring pacer  . Hypokalemia     Postoperative, improved  . Hyponatremia     Mild postoperative, improved  . Blood transfusion   . Constipation   . Arthritis pain   . Cancer     lt breast  . Hypertension     Benign Essential Hypertension    . Current use of long term anticoagulation   . Chronic atrial fibrillation   . Arthritis   . Pacemaker     Past Surgical History  Procedure Laterality Date  . Anterior cervical diskectomy and fusion      C-7  . Septal myotomy    . Pacemaker placement  2006/11-27-2013    MDT dual chamber pacemaker implanted 2006 with gen change by Dr Lovena Le 11-27-2013  . Replacement total knee bilateral    . Femur fracture surgery      Right  . Wrist fracture surgery      Right  . Shoulder surgery      Right  . Breast biopsy      Left, negative  . Colonoscopy    . Abdominal hysterectomy    . Cardiomyopathy    . Breast lumpectomy  2012    left breast  . Joint  replacement    . Permanent pacemaker generator change N/A 11/27/2013    Procedure: PERMANENT PACEMAKER GENERATOR CHANGE;  Surgeon: Evans Lance, MD;  Location: [facility] CATH LAB;  Service: Cardiovascular;  Laterality: N/A;    There were no vitals taken for this visit.  Visit Diagnosis:  Abnormality of gait  Status post above knee amputation of right lower extremity  Balance problems      Subjective Assessment - 02/10/15 1028    Symptoms No falls. Right arm is hurting more due to pushing wheelchair in house.    Currently in Pain? Yes   Pain Score 3   using arm on RW & raising it up 10/10, but returns to 3/10 immediately after use     Therapeutic Exercise / Self-care: PT demo /instructed in PROM of right shoulder supine with left arm assist and standing pendulum to maintain ROM / prevent frozen shoulder.  PT recommended standing to perform kitchen activities to prevent from reaching with right UE. PT also recommended walking with RW /prosthesis in home not using w/c to change locations. Patient verbalized understanding. Prosthetic Training: Patient ambulated 3'  X 2 (once on carpet around furniture) with minimal assist (no hand hold required today). Patient negotiated ramp & curb with RW & prosthesis with SBA / cues on technique. Patient negotiated 4 steps with 2 hands on right rail sideways with supervision / cues, PT instructed to use left UE to assist RUE up to rail due to pain & history of RCT not repaired. PT adjusted her RW height to assist right UE position. Patient reports felt better on her shoulder. Patient ambulated 125' X 2 with RW / prosthesis with cues on posture to improve clearance, No toe stubbing today.                      PT Education - 02/10/15 1015    Education provided Yes   Education Details shoulder ROM supine using left UE assist and standing leaning over penedulum, sit to /from stand prosthetic knee control   Person(s) Educated Patient    Methods Explanation   Comprehension Verbalized understanding          PT Short Term Goals - 01/23/15 1409    PT SHORT TERM GOAL #1   Title Donnes prosthesis correctly independently. (Target Date: 01/07/15)   Baseline Pt still needs cues/assist with sock ply management, although she reports donning her prosthesis indep.  She requires assist to remove prosthesis per reports.   Time 1   Period Months   Status Partially Met   PT SHORT TERM GOAL #2   Title Tolerates daily wear >8 hours /day without tenderness or skin issues. (Target Date: 01/07/15)   Baseline Pt reports wearing prosthesis >10 hours/day.   Time 1   Period Months   Status Achieved   PT SHORT TERM GOAL #3   Title ambulates 200' with walker & prosthesis modified independent. (Target Date: 01/07/15)   Baseline Pt is able to ambulate 200 ft with RW and prosthesis with close supervision.   Time 1   Period Months   Status Partially Met   PT SHORT TERM GOAL #4   Title negotiates ramp, curb with walker & stairs with 2 rails with prosthesis modified independent,. (Target Date: 01/07/15)   Baseline Pt requires supervision on ramp, curb and steps with prosthesis on 01/23/2015.   Time 1   Period Months   Status Partially Met   PT SHORT TERM GOAL #5   Title reaches 10" & rotates shoulders to look behind herself without UE support with supervision. (Target Date: 01/07/15)   Time 1   Period Months   Status On-going           PT Long Term Goals - 02/03/15 1708    PT LONG TERM GOAL #1   Title verbalizes & demonstrates proper prosthetic care. (NEW Target Date: 03/06/15)   Baseline 02/03/15 Patient needs cues to donne without rotation and adjusting ply socks.   Time 2   Period Months   Status On-going   PT LONG TERM GOAL #2   Title tolerates wear >90% of awake hours without skin issues or tenderness. (NEW Target Date: 03/06/15)   Baseline 02/03/15 Partially MET Patient tolerates wear >90% of awake hours with no skin issues but some  tenderness.   Time 2   Period Months   Status On-going   PT LONG TERM GOAL #3   Title ambulates >300' with LRAD & prosthesis modified independent. (NEW Target Date: 03/06/15)   Baseline 02/03/15 Patient ambulates up to 200' with rolling walker & prosthesis with supervision due to  poor clearance at times.    Time 2   Period Months   Status On-going   PT LONG TERM GOAL #4   Title negotiates ramp, curb & stairs with LRAD & prosthesis modified independent. (NEW Target Date: 03/06/15)   Baseline 02/03/15 Patient negotiates ramp & curb with rolling walker & prosthesis and stairs with 2 hands on 1 rail with supervision / cues for safety.    Time 2   Period Months   Status On-going   PT LONG TERM GOAL #5   Title Berg Balance with prosthesis >36/56 (Target Date: 02/06/15)   Time 2   Period Months   Status On-going   Additional Long Term Goals   Additional Long Term Goals Yes   PT LONG TERM GOAL #6   Title Patient ambulates 81' with [facility] & prosthesis carrying cup of water modified independent. (Target Date: 03/06/15)   Time 1   Period Months   Status New               Plan - 02/10/15 1015    Clinical Impression Statement Patient seems to understand PROM of shoulder to prevent frozen shoulder. Patient appears to have pain in shoulder with active lifting against gravity. Patient reported lifting right UE to rail on stairs increased pain to 8/10 while performing but using left UE to lift right UE pain only 2/10.   Pt will benefit from skilled therapeutic intervention in order to improve on the following deficits Abnormal gait;Decreased activity tolerance;Decreased balance;Decreased endurance;Decreased knowledge of use of DME;Decreased mobility;Decreased safety awareness;Decreased strength;Other (comment)   PT Frequency 2x / week   PT Treatment/Interventions ADLs/Self Care Home Management;DME Instruction;Gait training;Stair training;Functional mobility training;Therapeutic  activities;Therapeutic exercise;Balance training;Neuromuscular re-education;Patient/family education;Other (comment)   PT Next Visit Plan Gait training: community with RW and try [facility] household (pt still requires assist)   Consulted and Agree with Plan of Care Patient        Problem List Patient Active Problem List   Diagnosis Date Noted  . Unilateral AKA 09/23/2014  . Preop cardiovascular exam 01/16/2014  . Atrioventricular block, complete 07/03/2013  . Atrial fibrillation 07/03/2013  . Breast cancer, left breast 04/24/2013  . Other symptoms involving cardiovascular system 12/22/2010  . PACEMAKER, PERMANENT 06/21/2010  . MITRAL REGURGITATION 06/17/2010  . HYPERTENSION 06/17/2010  . HOCM (hypertrophic obstructive cardiomyopathy) 06/17/2010  . HEART BLOCK 06/17/2010  . HEMORRHOIDS 06/17/2010  . BRONCHITIS 06/17/2010  . ASTHMA 06/17/2010  . CYSTITIS 06/17/2010  . CHEST PAIN 06/17/2010  . URINARY INCONTINENCE 06/17/2010    Jamey Reas PT, DPT Physical Therapist Specializing in Prosthetics 02/10/2015, 5:15 PM  [facility] [address] [place], [place], [address] Phone: [phone]   Fax:  [phone]

## 2015-02-13 ENCOUNTER — Ambulatory Visit: Payer: Medicare Other | Admitting: Rehabilitative and Restorative Service Providers"

## 2015-02-13 VITALS — BP 150/98 | HR 66

## 2015-02-13 DIAGNOSIS — R269 Unspecified abnormalities of gait and mobility: Secondary | ICD-10-CM | POA: Diagnosis not present

## 2015-02-13 DIAGNOSIS — Z89611 Acquired absence of right leg above knee: Secondary | ICD-10-CM | POA: Diagnosis not present

## 2015-02-13 DIAGNOSIS — R6889 Other general symptoms and signs: Secondary | ICD-10-CM | POA: Diagnosis not present

## 2015-02-13 NOTE — Therapy (Signed)
[facility] [address] [place], [place], [address] Phone: [phone]   Fax:  [phone]  Physical Therapy Treatment  Patient Details  Name: [name] MRN: [id number] Date of Birth: Dec 10, 1931 Referring Provider:  Jani Gravel, MD  Encounter Date: 02/13/2015      PT End of Session - 02/13/15 1358    Visit Number 17  G code 7   Number of Visits 18   Date for PT Re-Evaluation 02/07/15   PT Start Time 1322   PT Stop Time 1404   PT Time Calculation (min) 42 min   Equipment Utilized During Treatment Gait belt   Activity Tolerance Patient tolerated treatment well   Behavior During Therapy [facility] for tasks assessed/performed      Past Medical History  Diagnosis Date  . Mitral regurgitation   . Hypertrophic obstructive cardiomyopathy     Required septal myotomy  . Urinary incontinence   . Cystitis   . Hemorrhoids   . Bronchitis   . Asthma   . Anemia   . Heart block     Complete heart block post surgical requiring pacer  . Hypokalemia     Postoperative, improved  . Hyponatremia     Mild postoperative, improved  . Blood transfusion   . Constipation   . Arthritis pain   . Cancer     lt breast  . Hypertension     Benign Essential Hypertension    . Current use of long term anticoagulation   . Chronic atrial fibrillation   . Arthritis   . Pacemaker     Past Surgical History  Procedure Laterality Date  . Anterior cervical diskectomy and fusion      C-7  . Septal myotomy    . Pacemaker placement  2006/11-27-2013    MDT dual chamber pacemaker implanted 2006 with gen change by Dr Lovena Le 11-27-2013  . Replacement total knee bilateral    . Femur fracture surgery      Right  . Wrist fracture surgery      Right  . Shoulder surgery      Right  . Breast biopsy      Left, negative  . Colonoscopy    . Abdominal hysterectomy    . Cardiomyopathy    . Breast lumpectomy  2012    left breast  . Joint  replacement    . Permanent pacemaker generator change N/A 11/27/2013    Procedure: PERMANENT PACEMAKER GENERATOR CHANGE;  Surgeon: Evans Lance, MD;  Location: [facility] CATH LAB;  Service: Cardiovascular;  Laterality: N/A;    BP 150/98 mmHg  Pulse 66  Visit Diagnosis:  Abnormality of gait      Subjective Assessment - 02/13/15 1326    Symptoms The patient reports she feels short of breath today.  She reports she had a hard time breathing last night when she was lying down in bed and couldn't sleep.  She reports "I thought about having my husband take me to the hospital", but she then reports he has a cold and she didn't want to bother him.  She reports tired now, but otherwise feels okay.   Currently in Pain? No/denies     PROSTHETIC TRAINING: Gait training with RW 230 ft with RW with cues on more reciprocal pattern not allowing RW to stop at initial swing of R prosthesis. Ambulation x 130 ft x 2 with RW, 100 ft x 2 with RW and cues on posture, and reciprocal pattern of  gait Curb with SBA with RW with verbal cues on position of feet to safely navigate curb Ramp with CGA with RW Stairs with CGA with one handrail ascending sideways and two handrails descending with step to pattern  SELF CARE/HOME MANAGEMENT: PT and patient discussed home safety and recommended continued use of RW vs. Quad cane due to patient comfort and safety Patient reports not feeling like she is making much more progress. PT recommended she discuss discharge plan with primary PT, Robin, and begin deciding on d/c plan.       PT Short Term Goals - 01/23/15 1409    PT SHORT TERM GOAL #1   Title Donnes prosthesis correctly independently. (Target Date: 01/07/15)   Baseline Pt still needs cues/assist with sock ply management, although she reports donning her prosthesis indep.  She requires assist to remove prosthesis per reports.   Time 1   Period Months   Status Partially Met   PT SHORT TERM GOAL #2   Title Tolerates  daily wear >8 hours /day without tenderness or skin issues. (Target Date: 01/07/15)   Baseline Pt reports wearing prosthesis >10 hours/day.   Time 1   Period Months   Status Achieved   PT SHORT TERM GOAL #3   Title ambulates 200' with walker & prosthesis modified independent. (Target Date: 01/07/15)   Baseline Pt is able to ambulate 200 ft with RW and prosthesis with close supervision.   Time 1   Period Months   Status Partially Met   PT SHORT TERM GOAL #4   Title negotiates ramp, curb with walker & stairs with 2 rails with prosthesis modified independent,. (Target Date: 01/07/15)   Baseline Pt requires supervision on ramp, curb and steps with prosthesis on 01/23/2015.   Time 1   Period Months   Status Partially Met   PT SHORT TERM GOAL #5   Title reaches 10" & rotates shoulders to look behind herself without UE support with supervision. (Target Date: 01/07/15)   Time 1   Period Months   Status On-going           PT Long Term Goals - 02/03/15 1708    PT LONG TERM GOAL #1   Title verbalizes & demonstrates proper prosthetic care. (NEW Target Date: 03/06/15)   Baseline 02/03/15 Patient needs cues to donne without rotation and adjusting ply socks.   Time 2   Period Months   Status On-going   PT LONG TERM GOAL #2   Title tolerates wear >90% of awake hours without skin issues or tenderness. (NEW Target Date: 03/06/15)   Baseline 02/03/15 Partially MET Patient tolerates wear >90% of awake hours with no skin issues but some tenderness.   Time 2   Period Months   Status On-going   PT LONG TERM GOAL #3   Title ambulates >300' with LRAD & prosthesis modified independent. (NEW Target Date: 03/06/15)   Baseline 02/03/15 Patient ambulates up to 200' with rolling walker & prosthesis with supervision due to poor clearance at times.    Time 2   Period Months   Status On-going   PT LONG TERM GOAL #4   Title negotiates ramp, curb & stairs with LRAD & prosthesis modified independent. (NEW Target  Date: 03/06/15)   Baseline 02/03/15 Patient negotiates ramp & curb with rolling walker & prosthesis and stairs with 2 hands on 1 rail with supervision / cues for safety.    Time 2   Period Months   Status On-going  PT LONG TERM GOAL #5   Title Berg Balance with prosthesis >36/56 (Target Date: 02/06/15)   Time 2   Period Months   Status On-going   Additional Long Term Goals   Additional Long Term Goals Yes   PT LONG TERM GOAL #6   Title Patient ambulates 28' with [facility] & prosthesis carrying cup of water modified independent. (Target Date: 03/06/15)   Time 1   Period Months   Status New               Plan - 02/13/15 1600    Clinical Impression Statement The patient verbalizes frustration that she does not think she is getting much better.  She does report in general not having a good day. PT recommended she discuss long term plan with primary PT and decide on discharge plan.  AT this time, it appears safer and more realistic to continue developing safety and endurance with RW for improved consistency of performance vs. trying quad cane.   PT Next Visit Plan Gait training: community with RW   Consulted and Agree with Plan of Care Patient        Problem List Patient Active Problem List   Diagnosis Date Noted  . Unilateral AKA 09/23/2014  . Preop cardiovascular exam 01/16/2014  . Atrioventricular block, complete 07/03/2013  . Atrial fibrillation 07/03/2013  . Breast cancer, left breast 04/24/2013  . Other symptoms involving cardiovascular system 12/22/2010  . PACEMAKER, PERMANENT 06/21/2010  . MITRAL REGURGITATION 06/17/2010  . HYPERTENSION 06/17/2010  . HOCM (hypertrophic obstructive cardiomyopathy) 06/17/2010  . HEART BLOCK 06/17/2010  . HEMORRHOIDS 06/17/2010  . BRONCHITIS 06/17/2010  . ASTHMA 06/17/2010  . CYSTITIS 06/17/2010  . CHEST PAIN 06/17/2010  . URINARY INCONTINENCE 06/17/2010    Kendle Erker, PT 02/13/2015, 4:02 PM  [facility] [address] [place], [place], [address] Phone: [phone]   Fax:  [phone]

## 2015-02-18 ENCOUNTER — Ambulatory Visit: Payer: Medicare Other | Attending: Physical Medicine & Rehabilitation | Admitting: Physical Therapy

## 2015-02-18 ENCOUNTER — Encounter: Payer: Self-pay | Admitting: Physical Therapy

## 2015-02-18 DIAGNOSIS — R269 Unspecified abnormalities of gait and mobility: Secondary | ICD-10-CM | POA: Insufficient documentation

## 2015-02-18 DIAGNOSIS — R6889 Other general symptoms and signs: Secondary | ICD-10-CM | POA: Diagnosis not present

## 2015-02-18 DIAGNOSIS — Z89611 Acquired absence of right leg above knee: Secondary | ICD-10-CM | POA: Insufficient documentation

## 2015-02-18 DIAGNOSIS — R2689 Other abnormalities of gait and mobility: Secondary | ICD-10-CM

## 2015-02-18 NOTE — Therapy (Signed)
[facility] [address] [place], [place], [address] Phone: [phone]   Fax:  [phone]  Physical Therapy Treatment  Patient Details  Name: [name] MRN: [id number] Date of Birth: [birth year] Referring Provider:  Jani Gravel, MD  Encounter Date: 02/18/2015      PT End of Session - 02/18/15 1445    Visit Number 18  G code 7   Number of Visits 23   Date for PT Re-Evaluation 03/06/15   PT Start Time 1962   PT Stop Time 1530   PT Time Calculation (min) 45 min   Equipment Utilized During Treatment Gait belt   Activity Tolerance Patient tolerated treatment well   Behavior During Therapy [facility] for tasks assessed/performed      Past Medical History  Diagnosis Date  . Mitral regurgitation   . Hypertrophic obstructive cardiomyopathy     Required septal myotomy  . Urinary incontinence   . Cystitis   . Hemorrhoids   . Bronchitis   . Asthma   . Anemia   . Heart block     Complete heart block post surgical requiring pacer  . Hypokalemia     Postoperative, improved  . Hyponatremia     Mild postoperative, improved  . Blood transfusion   . Constipation   . Arthritis pain   . Cancer     lt breast  . Hypertension     Benign Essential Hypertension    . Current use of long term anticoagulation   . Chronic atrial fibrillation   . Arthritis   . Pacemaker     Past Surgical History  Procedure Laterality Date  . Anterior cervical diskectomy and fusion      C-7  . Septal myotomy    . Pacemaker placement  2006/11-27-2013    MDT dual chamber pacemaker implanted 2006 with gen change by Dr Lovena Le 11-27-2013  . Replacement total knee bilateral    . Femur fracture surgery      Right  . Wrist fracture surgery      Right  . Shoulder surgery      Right  . Breast biopsy      Left, negative  . Colonoscopy    . Abdominal hysterectomy    . Cardiomyopathy    . Breast lumpectomy  2012    left breast  . Joint  replacement    . Permanent pacemaker generator change N/A 11/27/2013    Procedure: PERMANENT PACEMAKER GENERATOR CHANGE;  Surgeon: Evans Lance, MD;  Location: [facility] CATH LAB;  Service: Cardiovascular;  Laterality: N/A;    There were no vitals taken for this visit.  Visit Diagnosis:  Abnormality of gait  Status post above knee amputation of right lower extremity  Balance problems  Activity intolerance      Subjective Assessment - 02/18/15 1457    Symptoms She reports going to nursing home to visit son using w/c.    Currently in Pain? No/denies     Therapeutic Activities: PT instructed in returning to driving to with right leg amputation: crossover vs left foot accelerator. Difficulty with learning new skills at age of 46 and risk. Patient verbalized understanding. Kitchen: Patient ambulated sideway with counter support, reaching in upper & lower cabinet safely, moving heavy or hot pot along counter with towel safely.  Prosthetic Training: Patient ambulated 300' with RW / prosthesis with cues on posture and foot clearance. Patient negotiated stairs with 2 hands on left rail and on right rail with  side turn with supervision. Patient negotiated ramp & curb with RW / prosthesis with superivision.                      PT Education - 02/18/15 1445    Education provided Yes   Education Details She does not appear will be safe with SBQC so will procede with RW use.   Person(s) Educated Patient   Methods Explanation   Comprehension Verbalized understanding          PT Short Term Goals - 01/23/15 1409    PT SHORT TERM GOAL #1   Title Donnes prosthesis correctly independently. (Target Date: 01/07/15)   Baseline Pt still needs cues/assist with sock ply management, although she reports donning her prosthesis indep.  She requires assist to remove prosthesis per reports.   Time 1   Period Months   Status Partially Met   PT SHORT TERM GOAL #2   Title Tolerates daily  wear >8 hours /day without tenderness or skin issues. (Target Date: 01/07/15)   Baseline Pt reports wearing prosthesis >10 hours/day.   Time 1   Period Months   Status Achieved   PT SHORT TERM GOAL #3   Title ambulates 200' with walker & prosthesis modified independent. (Target Date: 01/07/15)   Baseline Pt is able to ambulate 200 ft with RW and prosthesis with close supervision.   Time 1   Period Months   Status Partially Met   PT SHORT TERM GOAL #4   Title negotiates ramp, curb with walker & stairs with 2 rails with prosthesis modified independent,. (Target Date: 01/07/15)   Baseline Pt requires supervision on ramp, curb and steps with prosthesis on 01/23/2015.   Time 1   Period Months   Status Partially Met   PT SHORT TERM GOAL #5   Title reaches 10" & rotates shoulders to look behind herself without UE support with supervision. (Target Date: 01/07/15)   Time 1   Period Months   Status On-going           PT Long Term Goals - 02/03/15 1708    PT LONG TERM GOAL #1   Title verbalizes & demonstrates proper prosthetic care. (NEW Target Date: 03/06/15)   Baseline 02/03/15 Patient needs cues to donne without rotation and adjusting ply socks.   Time 2   Period Months   Status On-going   PT LONG TERM GOAL #2   Title tolerates wear >90% of awake hours without skin issues or tenderness. (NEW Target Date: 03/06/15)   Baseline 02/03/15 Partially MET Patient tolerates wear >90% of awake hours with no skin issues but some tenderness.   Time 2   Period Months   Status On-going   PT LONG TERM GOAL #3   Title ambulates >300' with LRAD & prosthesis modified independent. (NEW Target Date: 03/06/15)   Baseline 02/03/15 Patient ambulates up to 200' with rolling walker & prosthesis with supervision due to poor clearance at times.    Time 2   Period Months   Status On-going   PT LONG TERM GOAL #4   Title negotiates ramp, curb & stairs with LRAD & prosthesis modified independent. (NEW Target Date:  03/06/15)   Baseline 02/03/15 Patient negotiates ramp & curb with rolling walker & prosthesis and stairs with 2 hands on 1 rail with supervision / cues for safety.    Time 2   Period Months   Status On-going   PT LONG TERM GOAL #5  Title Oceanographer with prosthesis >36/56 (Target Date: 02/06/15)   Time 2   Period Months   Status On-going   Additional Long Term Goals   Additional Long Term Goals Yes   PT LONG TERM GOAL #6   Title Patient ambulates 5' with [facility] & prosthesis carrying cup of water modified independent. (Target Date: 03/06/15)   Time 1   Period Months   Status New               Plan - 02/18/15 1445    Clinical Impression Statement Patient does not appear will be safe with SBQC. Patient was safe in kitchen holding counter.   Pt will benefit from skilled therapeutic intervention in order to improve on the following deficits Abnormal gait;Decreased activity tolerance;Decreased balance;Decreased endurance;Decreased knowledge of use of DME;Decreased mobility;Decreased safety awareness;Decreased strength;Other (comment)   PT Treatment/Interventions ADLs/Self Care Home Management;DME Instruction;Gait training;Stair training;Functional mobility training;Therapeutic activities;Therapeutic exercise;Balance training;Neuromuscular re-education;Patient/family education;Other (comment)   PT Next Visit Plan Gait training with RW   Consulted and Agree with Plan of Care Patient        Problem List Patient Active Problem List   Diagnosis Date Noted  . Unilateral AKA 09/23/2014  . Preop cardiovascular exam 01/16/2014  . Atrioventricular block, complete 07/03/2013  . Atrial fibrillation 07/03/2013  . Breast cancer, left breast 04/24/2013  . Other symptoms involving cardiovascular system 12/22/2010  . PACEMAKER, PERMANENT 06/21/2010  . MITRAL REGURGITATION 06/17/2010  . HYPERTENSION 06/17/2010  . HOCM (hypertrophic obstructive cardiomyopathy) 06/17/2010  . HEART BLOCK  06/17/2010  . HEMORRHOIDS 06/17/2010  . BRONCHITIS 06/17/2010  . ASTHMA 06/17/2010  . CYSTITIS 06/17/2010  . CHEST PAIN 06/17/2010  . URINARY INCONTINENCE 06/17/2010    Jamey Reas PT, DPT Physical Therapist Specializing in Prosthetics 02/18/2015, 8:47 PM  [facility] [address] [place], [place], [address] Phone: [phone]   Fax:  [phone]

## 2015-02-19 ENCOUNTER — Ambulatory Visit: Payer: Medicare Other | Admitting: Physical Therapy

## 2015-02-19 ENCOUNTER — Encounter: Payer: Self-pay | Admitting: Physical Therapy

## 2015-02-19 DIAGNOSIS — Z89611 Acquired absence of right leg above knee: Secondary | ICD-10-CM | POA: Diagnosis not present

## 2015-02-19 DIAGNOSIS — R269 Unspecified abnormalities of gait and mobility: Secondary | ICD-10-CM | POA: Diagnosis not present

## 2015-02-19 DIAGNOSIS — R2689 Other abnormalities of gait and mobility: Secondary | ICD-10-CM

## 2015-02-19 DIAGNOSIS — R6889 Other general symptoms and signs: Secondary | ICD-10-CM | POA: Diagnosis not present

## 2015-02-20 ENCOUNTER — Encounter: Payer: Self-pay | Admitting: Physical Therapy

## 2015-02-20 NOTE — Therapy (Signed)
[facility] [address] [place], [place], [address] Phone: [phone]   Fax:  [phone]  Physical Therapy Treatment  Patient Details  Name: [name] MRN: [id number] Date of Birth: 31-Aug-1931 Referring Provider:  Jani Gravel, MD  Encounter Date: 02/19/2015      PT End of Session - 02/19/15 1315    Visit Number 19  G code 7   Number of Visits 23   Date for PT Re-Evaluation 03/06/15   PT Start Time 1315   PT Stop Time 1345   PT Time Calculation (min) 30 min   Equipment Utilized During Treatment Gait belt   Activity Tolerance Patient tolerated treatment well   Behavior During Therapy [facility] for tasks assessed/performed      Past Medical History  Diagnosis Date  . Mitral regurgitation   . Hypertrophic obstructive cardiomyopathy     Required septal myotomy  . Urinary incontinence   . Cystitis   . Hemorrhoids   . Bronchitis   . Asthma   . Anemia   . Heart block     Complete heart block post surgical requiring pacer  . Hypokalemia     Postoperative, improved  . Hyponatremia     Mild postoperative, improved  . Blood transfusion   . Constipation   . Arthritis pain   . Cancer     lt breast  . Hypertension     Benign Essential Hypertension    . Current use of long term anticoagulation   . Chronic atrial fibrillation   . Arthritis   . Pacemaker     Past Surgical History  Procedure Laterality Date  . Anterior cervical diskectomy and fusion      C-7  . Septal myotomy    . Pacemaker placement  2006/11-27-2013    MDT dual chamber pacemaker implanted 2006 with gen change by Dr Lovena Le 11-27-2013  . Replacement total knee bilateral    . Femur fracture surgery      Right  . Wrist fracture surgery      Right  . Shoulder surgery      Right  . Breast biopsy      Left, negative  . Colonoscopy    . Abdominal hysterectomy    . Cardiomyopathy    . Breast lumpectomy  2012    left breast  . Joint  replacement    . Permanent pacemaker generator change N/A 11/27/2013    Procedure: PERMANENT PACEMAKER GENERATOR CHANGE;  Surgeon: Evans Lance, MD;  Location: [facility] CATH LAB;  Service: Cardiovascular;  Laterality: N/A;    There were no vitals taken for this visit.  Visit Diagnosis:  Abnormality of gait  Status post above knee amputation of right lower extremity  Balance problems        [facility] PT Assessment - 02/19/15 1315    Berg Balance Test   Sit to Stand Able to stand  independently using hands   Standing Unsupported Able to stand safely 2 minutes   Sitting with Back Unsupported but Feet Supported on Floor or Stool Able to sit safely and securely 2 minutes   Stand to Sit Controls descent by using hands   Transfers Able to transfer safely, definite need of hands   Standing Unsupported with Eyes Closed Able to stand 10 seconds with supervision   Standing Ubsupported with Feet Together Needs help to attain position but able to stand for 30 seconds with feet together   From Standing, Reach Forward with  Outstretched Arm Can reach forward >5 cm safely (2")   From Standing Position, Pick up Object from [place] to pick up shoe, needs supervision   From Standing Position, Turn to Look Behind Over each Shoulder Turn sideways only but maintains balance   Turn 360 Degrees Needs assistance while turning   Standing Unsupported, Alternately Place Feet on Step/Stool Needs assistance to keep from falling or unable to try   Standing Unsupported, One Foot in Front Loses balance while stepping or standing   Standing on One Leg Unable to try or needs assist to prevent fall   Total Score 28     Prosthetic Training Patient is independent in prosthetic care. She reports daily wear >90% of awake hours without issues. Patient ambulated 12' with rolling walker & prosthesis modified independent. Patient negotiated ramp & curb with RW & prosthesis modified independent. Patient negotiated 4 steps  sidestepping with 2 hands on 1 rail modified independent. Patient reports she has returned to using primary entrance of home which has 14 steps with 1 rail instead of driving around back to ramp.                     PT Education - 02/20/15 1315    Education provided Yes   Education Details ongoing activity level   Person(s) Educated Patient   Methods Explanation   Comprehension Verbalized understanding          PT Short Term Goals - 01/23/15 1409    PT SHORT TERM GOAL #1   Title Donnes prosthesis correctly independently. (Target Date: 01/07/15)   Baseline Pt still needs cues/assist with sock ply management, although she reports donning her prosthesis indep.  She requires assist to remove prosthesis per reports.   Time 1   Period Months   Status Partially Met   PT SHORT TERM GOAL #2   Title Tolerates daily wear >8 hours /day without tenderness or skin issues. (Target Date: 01/07/15)   Baseline Pt reports wearing prosthesis >10 hours/day.   Time 1   Period Months   Status Achieved   PT SHORT TERM GOAL #3   Title ambulates 200' with walker & prosthesis modified independent. (Target Date: 01/07/15)   Baseline Pt is able to ambulate 200 ft with RW and prosthesis with close supervision.   Time 1   Period Months   Status Partially Met   PT SHORT TERM GOAL #4   Title negotiates ramp, curb with walker & stairs with 2 rails with prosthesis modified independent,. (Target Date: 01/07/15)   Baseline Pt requires supervision on ramp, curb and steps with prosthesis on 01/23/2015.   Time 1   Period Months   Status Partially Met   PT SHORT TERM GOAL #5   Title reaches 10" & rotates shoulders to look behind herself without UE support with supervision. (Target Date: 01/07/15)   Time 1   Period Months   Status On-going           PT Long Term Goals - 02/20/15 1315    PT LONG TERM GOAL #1   Title verbalizes & demonstrates proper prosthetic care. (NEW Target Date: 03/06/15)    Baseline MET 3/316   Time 2   Period Months   Status Achieved   PT LONG TERM GOAL #2   Title tolerates wear >90% of awake hours without skin issues or tenderness. (NEW Target Date: 03/06/15)   Baseline MET 02/19/15   Time 2   Period Months  Status Achieved   PT LONG TERM GOAL #3   Title ambulates >300' with LRAD & prosthesis modified independent. (NEW Target Date: 03/06/15)   Baseline MET 02/27/15 with rolling walker & prosthesis   Time 2   Period Months   Status Achieved   PT LONG TERM GOAL #4   Title negotiates ramp, curb & stairs with LRAD & prosthesis modified independent. (NEW Target Date: 03/06/15)   Baseline MET 27-Feb-2015 with rolling walker & prosthesis   Time 2   Period Months   Status Achieved   PT LONG TERM GOAL #5   Title Berg Balance with prosthesis >36/56 (Target Date: 02/06/15)   Baseline NOT MET Berg Balance 28/56 on 02/27/15 (initial was 19/56 and increase >8 points is significant to reduce balance loss but <36/56 indicates high fall risk)   Time 2   Period Months   Status Partially Met   PT LONG TERM GOAL #6   Title Patient ambulates 47' with [facility] & prosthesis carrying cup of water modified independent. (Target Date: 03/06/15)   Baseline PT discontinued this LTG as not safe with SBQC.  02/18/15   Time 1   Period Months   Status Deferred               Plan - Feb 27, 2015 1315    Clinical Impression Statement Patient met 4 of 5 LTGs and progressed towards remaining LTG. See status of each goal. For safety, PT recommends patient continue mobility with rolling walker with her prosthesis.   PT Next Visit Plan Discharge today   Consulted and Agree with Plan of Care Patient          G-Codes - 27-Feb-2015 1400    Functional Assessment Tool Used Independent with donning and all aspects of prosthetic care. Wears prosthesis >90% of awake hours daily.   Functional Limitation Self care   Mobility: Walking and Moving Around Goal Status [id number]) At least 1 percent but less than 20  percent impaired, limited or restricted   Self Care Current Status (F7903) At least 1 percent but less than 20 percent impaired, limited or restricted      Problem List Patient Active Problem List   Diagnosis Date Noted  . Unilateral AKA 09/23/2014  . Preop cardiovascular exam 01/16/2014  . Atrioventricular block, complete 07/03/2013  . Atrial fibrillation 07/03/2013  . Breast cancer, left breast 04/24/2013  . Other symptoms involving cardiovascular system 12/22/2010  . PACEMAKER, PERMANENT 06/21/2010  . MITRAL REGURGITATION 06/17/2010  . HYPERTENSION 06/17/2010  . HOCM (hypertrophic obstructive cardiomyopathy) 06/17/2010  . HEART BLOCK 06/17/2010  . HEMORRHOIDS 06/17/2010  . BRONCHITIS 06/17/2010  . ASTHMA 06/17/2010  . CYSTITIS 06/17/2010  . CHEST PAIN 06/17/2010  . URINARY INCONTINENCE 06/17/2010    Jamey Reas PT, DPT Physical Therapist Specializing in Prosthetics 02/20/2015, 3:59 PM  [facility] [address] [place], [place], [address] Phone: [phone]   Fax:  [phone]

## 2015-02-20 NOTE — Therapy (Signed)
[facility] [address] [place], [place], [address] Phone: [phone]   Fax:  [phone]  Patient Details  Name: [name] MRN: [id number] Date of Birth: June 28, 1931 Referring Provider:  Rolena Infante, MD Encounter Date: 02/20/2015  PHYSICAL THERAPY DISCHARGE SUMMARY  Visits from Start of Care: 19  Current functional level related to goals / functional outcomes:     PT Long Term Goals - 02/20/15 1315    PT LONG TERM GOAL #1   Title verbalizes & demonstrates proper prosthetic care. (NEW Target Date: 03/06/15)   Baseline MET 3/316   Time 2   Period Months   Status Achieved   PT LONG TERM GOAL #2   Title tolerates wear >90% of awake hours without skin issues or tenderness. (NEW Target Date: 03/06/15)   Baseline MET 02/19/15   Time 2   Period Months   Status Achieved   PT LONG TERM GOAL #3   Title ambulates >300' with LRAD & prosthesis modified independent. (NEW Target Date: 03/06/15)   Baseline MET 02/19/15 with rolling walker & prosthesis   Time 2   Period Months   Status Achieved   PT LONG TERM GOAL #4   Title negotiates ramp, curb & stairs with LRAD & prosthesis modified independent. (NEW Target Date: 03/06/15)   Baseline MET 02/19/15 with rolling walker & prosthesis   Time 2   Period Months   Status Achieved   PT LONG TERM GOAL #5   Title Berg Balance with prosthesis >36/56 (Target Date: 02/06/15)   Baseline NOT MET Berg Balance 28/56 on 02/19/15 (initial was 19/56 and increase >8 points is significant to reduce balance loss but <36/56 indicates high fall risk)   Time 2   Period Months   Status Partially Met   PT LONG TERM GOAL #6   Title Patient ambulates 36' with [name] & prosthesis carrying cup of water modified independent. (Target Date: 03/06/15)   Baseline PT discontinued this LTG as not safe with SBQC.  02/18/15   Time 1   Period Months   Status Deferred       Remaining deficits: Patient is functioning at  basic community level with rolling walker and prosthesis. She does not appear safe with a cane even for household distances.   Education / Equipment: Prosthetic Care Plan: Patient agrees to discharge.  Patient goals were partially met. Patient is being discharged due to meeting the stated rehab goals.  And functioning at safe level????       Carlynn Purl, DPT Physical Therapist Specializing in Prosthetics 02/20/2015, 4:05 PM  [facility] [address] [place], [place], [address] Phone: [phone]   Fax:  [phone]

## 2015-02-23 ENCOUNTER — Encounter: Payer: Medicare Other | Admitting: Physical Therapy

## 2015-02-23 DIAGNOSIS — R0602 Shortness of breath: Secondary | ICD-10-CM | POA: Diagnosis not present

## 2015-02-23 DIAGNOSIS — Z8679 Personal history of other diseases of the circulatory system: Secondary | ICD-10-CM | POA: Diagnosis not present

## 2015-02-23 DIAGNOSIS — Z950 Presence of cardiac pacemaker: Secondary | ICD-10-CM | POA: Diagnosis not present

## 2015-02-23 DIAGNOSIS — I272 Other secondary pulmonary hypertension: Secondary | ICD-10-CM | POA: Diagnosis not present

## 2015-02-23 DIAGNOSIS — I482 Chronic atrial fibrillation: Secondary | ICD-10-CM | POA: Diagnosis not present

## 2015-02-24 ENCOUNTER — Ambulatory Visit: Payer: Medicare Other | Admitting: Physical Therapy

## 2015-02-26 ENCOUNTER — Encounter: Payer: Medicare Other | Admitting: Physical Therapy

## 2015-03-02 ENCOUNTER — Encounter: Payer: Medicare Other | Admitting: Physical Therapy

## 2015-03-04 ENCOUNTER — Encounter: Payer: Medicare Other | Admitting: Physical Therapy

## 2015-03-07 ENCOUNTER — Other Ambulatory Visit: Payer: Self-pay | Admitting: Internal Medicine

## 2015-03-09 ENCOUNTER — Encounter: Payer: Medicare Other | Admitting: Physical Therapy

## 2015-03-11 ENCOUNTER — Encounter: Payer: Medicare Other | Admitting: Physical Therapy

## 2015-03-24 DIAGNOSIS — Z950 Presence of cardiac pacemaker: Secondary | ICD-10-CM | POA: Diagnosis not present

## 2015-03-24 DIAGNOSIS — R221 Localized swelling, mass and lump, neck: Secondary | ICD-10-CM | POA: Diagnosis not present

## 2015-04-23 DIAGNOSIS — I10 Essential (primary) hypertension: Secondary | ICD-10-CM | POA: Diagnosis not present

## 2015-04-23 DIAGNOSIS — D649 Anemia, unspecified: Secondary | ICD-10-CM | POA: Diagnosis not present

## 2015-04-28 DIAGNOSIS — M810 Age-related osteoporosis without current pathological fracture: Secondary | ICD-10-CM | POA: Diagnosis not present

## 2015-04-28 DIAGNOSIS — M129 Arthropathy, unspecified: Secondary | ICD-10-CM | POA: Diagnosis not present

## 2015-04-28 DIAGNOSIS — I10 Essential (primary) hypertension: Secondary | ICD-10-CM | POA: Diagnosis not present

## 2015-05-17 ENCOUNTER — Emergency Department (HOSPITAL_COMMUNITY): Payer: Medicare Other

## 2015-05-17 ENCOUNTER — Observation Stay (HOSPITAL_COMMUNITY)
Admission: EM | Admit: 2015-05-17 | Discharge: 2015-05-18 | Disposition: A | Discharge status: Home / Self Care | Payer: Medicare Other | Attending: Cardiology | Admitting: Cardiology

## 2015-05-17 ENCOUNTER — Encounter (HOSPITAL_COMMUNITY): Payer: Self-pay | Admitting: Emergency Medicine

## 2015-05-17 DIAGNOSIS — R0602 Shortness of breath: Secondary | ICD-10-CM | POA: Diagnosis not present

## 2015-05-17 DIAGNOSIS — Z7982 Long term (current) use of aspirin: Secondary | ICD-10-CM | POA: Insufficient documentation

## 2015-05-17 DIAGNOSIS — R600 Localized edema: Secondary | ICD-10-CM | POA: Diagnosis present

## 2015-05-17 DIAGNOSIS — I129 Hypertensive chronic kidney disease with stage 1 through stage 4 chronic kidney disease, or unspecified chronic kidney disease: Secondary | ICD-10-CM | POA: Insufficient documentation

## 2015-05-17 DIAGNOSIS — Z950 Presence of cardiac pacemaker: Secondary | ICD-10-CM | POA: Diagnosis not present

## 2015-05-17 DIAGNOSIS — E876 Hypokalemia: Secondary | ICD-10-CM | POA: Diagnosis not present

## 2015-05-17 DIAGNOSIS — Z90710 Acquired absence of both cervix and uterus: Secondary | ICD-10-CM | POA: Diagnosis not present

## 2015-05-17 DIAGNOSIS — R519 Headache, unspecified: Secondary | ICD-10-CM

## 2015-05-17 DIAGNOSIS — R51 Headache: Secondary | ICD-10-CM | POA: Diagnosis not present

## 2015-05-17 DIAGNOSIS — Z89511 Acquired absence of right leg below knee: Secondary | ICD-10-CM | POA: Insufficient documentation

## 2015-05-17 DIAGNOSIS — I272 Other secondary pulmonary hypertension: Secondary | ICD-10-CM | POA: Insufficient documentation

## 2015-05-17 DIAGNOSIS — I421 Obstructive hypertrophic cardiomyopathy: Secondary | ICD-10-CM | POA: Insufficient documentation

## 2015-05-17 DIAGNOSIS — Z7901 Long term (current) use of anticoagulants: Secondary | ICD-10-CM | POA: Insufficient documentation

## 2015-05-17 DIAGNOSIS — R069 Unspecified abnormalities of breathing: Secondary | ICD-10-CM | POA: Diagnosis not present

## 2015-05-17 DIAGNOSIS — N183 Chronic kidney disease, stage 3 (moderate): Secondary | ICD-10-CM | POA: Insufficient documentation

## 2015-05-17 DIAGNOSIS — I482 Chronic atrial fibrillation: Secondary | ICD-10-CM | POA: Insufficient documentation

## 2015-05-17 DIAGNOSIS — Z882 Allergy status to sulfonamides status: Secondary | ICD-10-CM | POA: Diagnosis not present

## 2015-05-17 DIAGNOSIS — R5383 Other fatigue: Secondary | ICD-10-CM | POA: Diagnosis not present

## 2015-05-17 DIAGNOSIS — J45909 Unspecified asthma, uncomplicated: Secondary | ICD-10-CM | POA: Insufficient documentation

## 2015-05-17 DIAGNOSIS — Z800 Family history of malignant neoplasm of digestive organs: Secondary | ICD-10-CM | POA: Diagnosis not present

## 2015-05-17 DIAGNOSIS — Z89611 Acquired absence of right leg above knee: Secondary | ICD-10-CM | POA: Diagnosis not present

## 2015-05-17 DIAGNOSIS — Z885 Allergy status to narcotic agent status: Secondary | ICD-10-CM | POA: Insufficient documentation

## 2015-05-17 DIAGNOSIS — I5033 Acute on chronic diastolic (congestive) heart failure: Principal | ICD-10-CM | POA: Insufficient documentation

## 2015-05-17 DIAGNOSIS — Z853 Personal history of malignant neoplasm of breast: Secondary | ICD-10-CM | POA: Insufficient documentation

## 2015-05-17 DIAGNOSIS — M1990 Unspecified osteoarthritis, unspecified site: Secondary | ICD-10-CM | POA: Diagnosis not present

## 2015-05-17 HISTORY — DX: Other amnesia: R41.3

## 2015-05-17 LAB — CBC WITH DIFFERENTIAL/PLATELET
Basophils Absolute: 0 10*3/uL (ref 0.0–0.1)
Basophils Relative: 0 % (ref 0–1)
Eosinophils Absolute: 0.1 10*3/uL (ref 0.0–0.7)
Eosinophils Relative: 1 % (ref 0–5)
HCT: 39.7 % (ref 36.0–46.0)
Hemoglobin: 12.7 g/dL (ref 12.0–15.0)
Lymphocytes Relative: 15 % (ref 12–46)
Lymphs Abs: 1 10*3/uL (ref 0.7–4.0)
MCH: 30.7 pg (ref 26.0–34.0)
MCHC: 32 g/dL (ref 30.0–36.0)
MCV: 95.9 fL (ref 78.0–100.0)
Monocytes Absolute: 0.9 10*3/uL (ref 0.1–1.0)
Monocytes Relative: 13 % — ABNORMAL HIGH (ref 3–12)
Neutro Abs: 5 10*3/uL (ref 1.7–7.7)
Neutrophils Relative %: 71 % (ref 43–77)
Platelets: 169 10*3/uL (ref 150–400)
RBC: 4.14 MIL/uL (ref 3.87–5.11)
RDW: 13.8 % (ref 11.5–15.5)
WBC: 7 10*3/uL (ref 4.0–10.5)

## 2015-05-17 LAB — COMPREHENSIVE METABOLIC PANEL
ALT: 18 U/L (ref 14–54)
AST: 32 U/L (ref 15–41)
Albumin: 3.8 g/dL (ref 3.5–5.0)
Alkaline Phosphatase: 62 U/L (ref 38–126)
Anion gap: 10 (ref 5–15)
BUN: 17 mg/dL (ref 6–20)
CO2: 24 mmol/L (ref 22–32)
Calcium: 9.2 mg/dL (ref 8.9–10.3)
Chloride: 107 mmol/L (ref 101–111)
Creatinine, Ser: 1.01 mg/dL — ABNORMAL HIGH (ref 0.44–1.00)
GFR calc Af Amer: 59 mL/min — ABNORMAL LOW (ref >60)
GFR calc non Af Amer: 51 mL/min — ABNORMAL LOW (ref >60)
Glucose, Bld: 110 mg/dL — ABNORMAL HIGH (ref 65–99)
Potassium: 3.8 mmol/L (ref 3.5–5.1)
Sodium: 141 mmol/L (ref 135–145)
Total Bilirubin: 1.1 mg/dL (ref 0.3–1.2)
Total Protein: 6.5 g/dL (ref 6.5–8.1)

## 2015-05-17 LAB — I-STAT TROPONIN, ED: Troponin i, poc: 0.12 ng/mL (ref 0.00–0.08)

## 2015-05-17 LAB — BRAIN NATRIURETIC PEPTIDE: B Natriuretic Peptide: 3205.8 pg/mL — ABNORMAL HIGH (ref 0.0–100.0)

## 2015-05-17 LAB — TROPONIN I: Troponin I: 0.13 ng/mL — ABNORMAL HIGH (ref <0.031)

## 2015-05-17 LAB — LIPASE, BLOOD: Lipase: 16 U/L — ABNORMAL LOW (ref 22–51)

## 2015-05-17 MED ORDER — ASPIRIN 81 MG PO CHEW
324.0000 mg | CHEWABLE_TABLET | Freq: Once | ORAL | Status: AC
  Administered 2015-05-17: 324 mg via ORAL
  Filled 2015-05-17: qty 4

## 2015-05-17 MED ORDER — FUROSEMIDE 10 MG/ML IJ SOLN
40.0000 mg | Freq: Once | INTRAMUSCULAR | Status: AC
  Administered 2015-05-18: 40 mg via INTRAVENOUS
  Filled 2015-05-17: qty 4

## 2015-05-17 NOTE — ED Notes (Signed)
Pt arrives wit intermittent SOB, headache and diarrhea ongoing for two days. Intermittent hot flashes as well - states the last time this happened she was experiencing heart problems. No pain at this time. 1 episode of diarrhea in the last 24 hours.

## 2015-05-17 NOTE — ED Provider Notes (Signed)
CSN: [id number]     Arrival date & time 05/17/15  2139 History   First MD Initiated Contact with Patient 05/17/15 2157     Chief Complaint  Patient presents with  . Shortness of Breath  . Headache  . Diarrhea     (Consider location/radiation/quality/duration/timing/severity/associated sxs/prior Treatment) HPI Comments: Patient presents to the ER for evaluation of shortness of breath. Patient reports the symptoms have been ongoing for several days. She has not been expressing any chest pain. She has noticed some intermittent hot flashes and has felt weak, like she is going to pass out. She is complaining of generalized weakness. Patient reports that she felt similar symptoms when she had "heart problems" in the past. She does report that she has been having intermittent headaches over the last 24 hours. Headache comes and goes. No numbness, tingling, weakness of extremities.   Past Medical History  Diagnosis Date  . Mitral regurgitation   . Hypertrophic obstructive cardiomyopathy     Required septal myotomy  . Urinary incontinence   . Cystitis   . Hemorrhoids   . Bronchitis   . Asthma   . Anemia   . Heart block     Complete heart block post surgical requiring pacer  . Hypokalemia     Postoperative, improved  . Hyponatremia     Mild postoperative, improved  . Blood transfusion   . Constipation   . Arthritis pain   . Cancer     lt breast  . Hypertension     Benign Essential Hypertension    . Current use of long term anticoagulation   . Chronic atrial fibrillation   . Arthritis   . Pacemaker   . Memory difficulty    Past Surgical History  Procedure Laterality Date  . Anterior cervical diskectomy and fusion      C-7  . Septal myotomy    . Pacemaker placement  2006/11-27-2013    MDT dual chamber pacemaker implanted 2006 with gen change by Dr Lovena Le 11-27-2013  . Replacement total knee bilateral    . Femur fracture surgery      Right  . Wrist fracture surgery       Right  . Shoulder surgery      Right  . Breast biopsy      Left, negative  . Colonoscopy    . Abdominal hysterectomy    . Cardiomyopathy    . Breast lumpectomy  2012    left breast  . Joint replacement    . Permanent pacemaker generator change N/A 11/27/2013    Procedure: PERMANENT PACEMAKER GENERATOR CHANGE;  Surgeon: Evans Lance, MD;  Location: [facility] CATH LAB;  Service: Cardiovascular;  Laterality: N/A;  . Below knee leg amputation Right    Family History  Problem Relation Age of Onset  . Heart disease Mother   . Hypertension Mother   . Pancreatic cancer Mother   . Cancer Mother     pancreatic  . Pancreatic cancer Father   . Cancer Father     pancreatic  . Diabetes Brother   . Pancreatic cancer Other   . Heart disease Brother     Enlarged heart   History  Substance Use Topics  . Smoking status: Never Smoker   . Smokeless tobacco: Not on file  . Alcohol Use: No   OB History    No data available     Review of Systems  Constitutional: Positive for fatigue.  Respiratory: Positive for shortness of breath.  Cardiovascular: Negative for chest pain.  Neurological: Positive for headaches.  All other systems reviewed and are negative.     Allergies  Ceftaroline; Codeine; Morphine; and Sulfa antibiotics  Home Medications   Prior to Admission medications   Medication Sig Start Date End Date Taking? Authorizing Provider  acetaminophen (TYLENOL) 500 MG tablet Take 1,000 mg by mouth every 6 (six) hours as needed for moderate pain.     Historical Provider, MD  albuterol (PROAIR HFA) 108 (90 BASE) MCG/ACT inhaler Inhale 2 puffs into the lungs every 6 (six) hours as needed for wheezing. 06/05/14   Evans Lance, MD  alendronate (FOSAMAX) 70 MG tablet Take 70 mg by mouth every 7 (seven) days. Take with a full glass of water on an empty stomach. Takes on Thursday    Historical Provider, MD  amLODipine (NORVASC) 2.5 MG tablet Take by mouth.    Historical Provider, MD   apixaban (ELIQUIS) 2.5 MG TABS tablet Take 1 tablet (2.5 mg total) by mouth 2 (two) times daily. 10/01/14   Lavon Paganini Angiulli, PA-C  calcium citrate-vitamin D (CITRACAL+D) 315-200 MG-UNIT per tablet Take 1 tablet by mouth 2 (two) times daily. 10/01/14   Lavon Paganini Angiulli, PA-C  cyclobenzaprine (FLEXERIL) 5 MG tablet Take 1 tablet (5 mg total) by mouth daily. 10/01/14   Lavon Paganini Angiulli, PA-C  cycloSPORINE (RESTASIS) 0.05 % ophthalmic emulsion Place 1 drop into both eyes 2 (two) times daily. 10/01/14   Lavon Paganini Angiulli, PA-C  ELIQUIS 2.5 MG TABS tablet TAKE 1 TABLET TWICE A DAY 03/09/15   Evans Lance, MD  fluticasone [facility]) 50 MCG/ACT nasal spray Place 2 sprays into both nostrils 2 (two) times daily.     Historical Provider, MD  furosemide (LASIX) 20 MG tablet Take 1 tablet (20 mg total) by mouth 2 (two) times daily. 10/01/14   Lavon Paganini Angiulli, PA-C  gabapentin (NEURONTIN) 300 MG capsule Take 1 capsule (300 mg total) by mouth 3 (three) times daily. 10/01/14   Lavon Paganini Angiulli, PA-C  metoprolol succinate (TOPROL-XL) 25 MG 24 hr tablet Take 1 tablet (25 mg total) by mouth daily. 10/01/14   Lavon Paganini Angiulli, PA-C  mirtazapine (REMERON) 15 MG tablet Take 1 tablet (15 mg total) by mouth at bedtime. 10/01/14   Lavon Paganini Angiulli, PA-C  Multiple Vitamin (MULTIVITAMIN) tablet Take 1 tablet by mouth 2 (two) times daily.     Historical Provider, MD  oxyCODONE 10 MG TABS Take 1 tablet (10 mg total) by mouth every 4 (four) hours as needed for severe pain. 10/01/14   Lavon Paganini Angiulli, PA-C  potassium chloride SA (K-DUR,KLOR-CON) 20 MEQ tablet Take 1 tablet (20 mEq total) by mouth 2 (two) times daily. 10/01/14   Lavon Paganini Angiulli, PA-C  traMADol (ULTRAM) 50 MG tablet Take by mouth.    Historical Provider, MD   BP 156/84 mmHg  Pulse 35  Temp(Src) 97.7 F (36.5 C) (Oral)  Resp 25  Ht 5\' 3"  (1.6 m)  Wt 129 lb (58.514 kg)  BMI 22.86 kg/m2  SpO2 96% Physical Exam  Constitutional: She is oriented to  person, place, and time. She appears well-developed and well-nourished. No distress.  HENT:  Head: Normocephalic and atraumatic.  Right Ear: Hearing normal.  Left Ear: Hearing normal.  Nose: Nose normal.  Mouth/Throat: Oropharynx is clear and moist and mucous membranes are normal.  Eyes: Conjunctivae and EOM are normal. Pupils are equal, round, and reactive to light.  Neck: Normal range of motion. Neck  supple. JVD present.  Cardiovascular: Regular rhythm, S1 normal and S2 normal.  Exam reveals no gallop and no friction rub.   No murmur heard. Pulmonary/Chest: Effort normal. No respiratory distress. She has rales. She exhibits no tenderness.  Abdominal: Soft. Normal appearance and bowel sounds are normal. There is no hepatosplenomegaly. There is no tenderness. There is no rebound, no guarding, no tenderness at McBurney's point and negative Murphy's sign. No hernia.  Musculoskeletal: Normal range of motion. She exhibits edema (3+ pitting edema left leg, right BKA).  Neurological: She is alert and oriented to person, place, and time. She has normal strength. No cranial nerve deficit or sensory deficit. Coordination normal. GCS eye subscore is 4. GCS verbal subscore is 5. GCS motor subscore is 6.  Skin: Skin is warm, dry and intact. No rash noted. No cyanosis.  Psychiatric: She has a normal mood and affect. Her speech is normal and behavior is normal. Thought content normal.  Nursing note and vitals reviewed.   ED Course  Procedures (including critical care time) Labs Review Labs Reviewed  CBC WITH DIFFERENTIAL/PLATELET - Abnormal; Notable for the following:    Monocytes Relative 13 (*)    All other components within normal limits  COMPREHENSIVE METABOLIC PANEL - Abnormal; Notable for the following:    Glucose, Bld 110 (*)    Creatinine, Ser 1.01 (*)    GFR calc non Af Amer 51 (*)    GFR calc Af Amer 59 (*)    All other components within normal limits  LIPASE, BLOOD - Abnormal; Notable  for the following:    Lipase 16 (*)    All other components within normal limits  BRAIN NATRIURETIC PEPTIDE - Abnormal; Notable for the following:    B Natriuretic Peptide 3205.8 (*)    All other components within normal limits  TROPONIN I - Abnormal; Notable for the following:    Troponin I 0.13 (*)    All other components within normal limits  I-STAT TROPOININ, ED - Abnormal; Notable for the following:    Troponin i, poc 0.12 (*)    All other components within normal limits  URINALYSIS, ROUTINE W REFLEX MICROSCOPIC (NOT AT [facility])    Imaging Review Dg Chest 2 View  05/17/2015   CLINICAL DATA:  Shortness of breath for 1 day.  EXAM: CHEST  2 VIEW  COMPARISON:  09/23/2014, 06/03/2014  FINDINGS: Dual lead right-sided pacemaker remains in place. Patient is post median sternotomy. Lower lung volumes accentuating the cardiac size, mild cardiac enlargement. The lungs remain hyperinflated with increased AP dimension. No confluent airspace disease, pulmonary edema, pleural effusion or pneumothorax. The bones are under mineralized. Right proximal humeral prosthesis and postsurgical change in the cervical spine, partially included.  IMPRESSION: 1. Unchanged hyperinflation. 2. No acute pulmonary process.   Electronically Signed   By: Jeb Levering M.D.   On: 05/17/2015 23:30   Ct Head Wo Contrast  05/17/2015   CLINICAL DATA:  Headache.  History of left-sided breast cancer.  EXAM: CT HEAD WITHOUT CONTRAST  TECHNIQUE: Contiguous axial images were obtained from the base of the skull through the vertex without intravenous contrast.  COMPARISON:  None.  FINDINGS: Generalized cerebral and cerebellar atrophy. There is moderate periventricular and deep white matter hypodensity, nonspecific, likely related to chronic small vessel ischemia.No intracranial hemorrhage, mass effect, or midline shift. No hydrocephalus. The basilar cisterns are patent. No evidence of territorial infarct. No intracranial fluid collection.  Calvarium is intact. Included paranasal sinuses and mastoid air cells  are well aerated.  IMPRESSION: 1.  No acute intracranial abnormality. 2. Atrophy and chronic small vessel ischemia.   Electronically Signed   By: Jeb Levering M.D.   On: 05/17/2015 23:36     EKG Interpretation   Date/Time:  Sunday May 17 2015 21:49:09 EDT Ventricular Rate:  80 PR Interval:    QRS Duration: 169 QT Interval:  435 QTC Calculation: 502 R Axis:   -51 Text Interpretation:  Afib/flutter and ventricular-paced rhythm No further  analysis attempted due to paced rhythm Confirmed by Natori Gudino  MD,  [place] [phone]) on 05/17/2015 9:58:18 PM      MDM   Final diagnoses:  Shortness of breath  Headache    Patient presents to the ER for evaluation of shortness of breath. Patient reports symptoms over a period of several days. Examination revealed DVT and diffuse rales concerning for congestive heart failure. Additionally, patient has significant pitting edema of her leg. Patient is not experiencing chest pain. She does have a history of complete heart block, status post pacemaker placement. I do not see any ongoing treatment for congestive heart failure in her records. She did have an echo performed at [facility] last year that showed ejection fraction of 50% and tricuspid regurg.  She has a markedly elevated BNP. She also has an elevated troponin. EKG shows ventricular paced rhythm, no further analysis as possible. Patient administered IV Lasix for acute congestive heart failure. Will require admission for further management.    Orpah Greek, MD 05/17/15 [phone]

## 2015-05-18 ENCOUNTER — Encounter (HOSPITAL_COMMUNITY): Payer: Self-pay | Admitting: General Practice

## 2015-05-18 DIAGNOSIS — N189 Chronic kidney disease, unspecified: Secondary | ICD-10-CM | POA: Diagnosis not present

## 2015-05-18 DIAGNOSIS — I10 Essential (primary) hypertension: Secondary | ICD-10-CM | POA: Diagnosis not present

## 2015-05-18 DIAGNOSIS — I5033 Acute on chronic diastolic (congestive) heart failure: Secondary | ICD-10-CM | POA: Diagnosis not present

## 2015-05-18 HISTORY — DX: Acute on chronic diastolic (congestive) heart failure: I50.33

## 2015-05-18 LAB — BASIC METABOLIC PANEL
Anion gap: 13 (ref 5–15)
BUN: 16 mg/dL (ref 6–20)
CO2: 27 mmol/L (ref 22–32)
Calcium: 9.5 mg/dL (ref 8.9–10.3)
Chloride: 103 mmol/L (ref 101–111)
Creatinine, Ser: 1.04 mg/dL — ABNORMAL HIGH (ref 0.44–1.00)
GFR calc Af Amer: 57 mL/min — ABNORMAL LOW (ref >60)
GFR calc non Af Amer: 49 mL/min — ABNORMAL LOW (ref >60)
Glucose, Bld: 110 mg/dL — ABNORMAL HIGH (ref 65–99)
Potassium: 3.9 mmol/L (ref 3.5–5.1)
Sodium: 143 mmol/L (ref 135–145)

## 2015-05-18 LAB — TROPONIN I
Troponin I: 0.19 ng/mL — ABNORMAL HIGH (ref <0.031)
Troponin I: 0.18 ng/mL — ABNORMAL HIGH (ref <0.031)

## 2015-05-18 LAB — URINALYSIS, ROUTINE W REFLEX MICROSCOPIC
Bilirubin Urine: NEGATIVE
Glucose, UA: NEGATIVE mg/dL
Ketones, ur: NEGATIVE mg/dL
Nitrite: NEGATIVE
Protein, ur: 30 mg/dL — AB
Specific Gravity, Urine: 1.009 (ref 1.005–1.030)
Urobilinogen, UA: 1 mg/dL (ref 0.0–1.0)
pH: 5.5 (ref 5.0–8.0)

## 2015-05-18 LAB — BRAIN NATRIURETIC PEPTIDE: B Natriuretic Peptide: 3255.5 pg/mL — ABNORMAL HIGH (ref 0.0–100.0)

## 2015-05-18 LAB — COMPREHENSIVE METABOLIC PANEL
ALT: 22 U/L (ref 14–54)
AST: 36 U/L (ref 15–41)
Albumin: 4.4 g/dL (ref 3.5–5.0)
Alkaline Phosphatase: 68 U/L (ref 38–126)
Anion gap: 14 (ref 5–15)
BUN: 20 mg/dL (ref 6–20)
CO2: 28 mmol/L (ref 22–32)
Calcium: 9.9 mg/dL (ref 8.9–10.3)
Chloride: 102 mmol/L (ref 101–111)
Creatinine, Ser: 0.98 mg/dL (ref 0.44–1.00)
GFR calc Af Amer: >60 mL/min (ref >60)
GFR calc non Af Amer: 53 mL/min — ABNORMAL LOW (ref >60)
Glucose, Bld: 98 mg/dL (ref 65–99)
Potassium: 3.9 mmol/L (ref 3.5–5.1)
Sodium: 144 mmol/L (ref 135–145)
Total Bilirubin: 1.5 mg/dL — ABNORMAL HIGH (ref 0.3–1.2)
Total Protein: 7.5 g/dL (ref 6.5–8.1)

## 2015-05-18 LAB — URINE MICROSCOPIC-ADD ON

## 2015-05-18 MED ORDER — LISINOPRIL 5 MG PO TABS
5.0000 mg | ORAL_TABLET | Freq: Every day | ORAL | Status: DC
  Administered 2015-05-18: 5 mg via ORAL
  Filled 2015-05-18: qty 1

## 2015-05-18 MED ORDER — METOPROLOL TARTRATE 25 MG PO TABS
25.0000 mg | ORAL_TABLET | Freq: Two times a day (BID) | ORAL | Status: DC
  Administered 2015-05-18: 25 mg via ORAL
  Filled 2015-05-18 (×2): qty 1

## 2015-05-18 MED ORDER — SODIUM CHLORIDE 0.9 % IJ SOLN
3.0000 mL | Freq: Two times a day (BID) | INTRAMUSCULAR | Status: DC
  Administered 2015-05-18: 3 mL via INTRAVENOUS

## 2015-05-18 MED ORDER — SODIUM CHLORIDE 0.9 % IJ SOLN: 3.0000 mL | INTRAMUSCULAR | Status: DC | PRN

## 2015-05-18 MED ORDER — ONDANSETRON HCL 4 MG/2ML IJ SOLN: 4.0000 mg | Freq: Four times a day (QID) | INTRAMUSCULAR | Status: DC | PRN

## 2015-05-18 MED ORDER — ACETAMINOPHEN 325 MG PO TABS: 650.0000 mg | ORAL_TABLET | ORAL | Status: DC | PRN

## 2015-05-18 MED ORDER — FUROSEMIDE 10 MG/ML IJ SOLN
40.0000 mg | Freq: Once | INTRAMUSCULAR | Status: AC
  Administered 2015-05-18: 40 mg via INTRAVENOUS
  Filled 2015-05-18: qty 4

## 2015-05-18 MED ORDER — SODIUM CHLORIDE 0.9 % IV SOLN: 250.0000 mL | INTRAVENOUS | Status: DC | PRN

## 2015-05-18 MED ORDER — POTASSIUM CHLORIDE CRYS ER 20 MEQ PO TBCR
40.0000 meq | EXTENDED_RELEASE_TABLET | Freq: Once | ORAL | Status: AC
  Administered 2015-05-18: 40 meq via ORAL
  Filled 2015-05-18: qty 2

## 2015-05-18 MED ORDER — DILTIAZEM HCL ER COATED BEADS 180 MG PO CP24: 180.0000 mg | ORAL_CAPSULE | Freq: Every day | ORAL | Status: DC

## 2015-05-18 MED ORDER — APIXABAN 2.5 MG PO TABS
2.5000 mg | ORAL_TABLET | Freq: Two times a day (BID) | ORAL | Status: DC
  Administered 2015-05-18: 2.5 mg via ORAL
  Filled 2015-05-18 (×2): qty 1

## 2015-05-18 MED ORDER — DILTIAZEM HCL ER COATED BEADS 180 MG PO CP24
180.0000 mg | ORAL_CAPSULE | Freq: Every day | ORAL | Status: DC
  Administered 2015-05-18: 180 mg via ORAL
  Filled 2015-05-18: qty 1

## 2015-05-18 NOTE — H&P (Signed)
[name] is an 81 y.o. female.   Chief Complaint: leg edema and dyspnea HPI: [name]  is a 81 y.o. female  with a history of complete heart block, Chronic A. Fibrillation and status post permanent pacemaker insertion, hypertension, and arthritis.She has right  AKA on 09/17/2014 for non healing ulcer of the leg.   She was admitted via emergency room last night. This morning patient states that her leg edema has completely resolved, no more orthopnea, except she has cramping in her legs. Husband present the bedside. No other specific complaints.    Past Medical History  Diagnosis Date  . Mitral regurgitation   . Hypertrophic obstructive cardiomyopathy     Required septal myotomy  . Urinary incontinence   . Cystitis   . Hemorrhoids   . Bronchitis   . Asthma   . Anemia   . Heart block     Complete heart block post surgical requiring pacer  . Hypokalemia     Postoperative, improved  . Hyponatremia     Mild postoperative, improved  . Blood transfusion   . Constipation   . Arthritis pain   . Cancer     lt breast  . Hypertension     Benign Essential Hypertension    . Current use of long term anticoagulation   . Chronic atrial fibrillation   . Arthritis   . Pacemaker   . Memory difficulty     Past Surgical History  Procedure Laterality Date  . Anterior cervical diskectomy and fusion      C-7  . Septal myotomy    . Pacemaker placement  2006/11-27-2013    MDT dual chamber pacemaker implanted 2006 with gen change by Dr Lovena Le 11-27-2013  . Replacement total knee bilateral    . Femur fracture surgery      Right  . Wrist fracture surgery      Right  . Shoulder surgery      Right  . Breast biopsy      Left, negative  . Colonoscopy    . Abdominal hysterectomy    . Cardiomyopathy    . Breast lumpectomy  2012    left breast  . Joint replacement    . Permanent pacemaker generator change N/A 11/27/2013    Procedure: PERMANENT PACEMAKER GENERATOR CHANGE;  Surgeon:  Evans Lance, MD;  Location[facility] CATH LAB;  Service: Cardiovascular;  Laterality: N/A;  . Below knee leg amputation Right     Family History  Problem Relation Age of Onset  . Heart disease Mother   . Hypertension Mother   . Pancreatic cancer Mother   . Cancer Mother     pancreatic  . Pancreatic cancer Father   . Cancer Father     pancreatic  . Diabetes Brother   . Pancreatic cancer Other   . Heart disease Brother     Enlarged heart   Social History:  reports that she has never smoked. She does not have any smokeless tobacco history on file. She reports that she does not drink alcohol or use illicit drugs.  Allergies:  Allergies  Allergen Reactions  . Ceftaroline Rash  . Codeine Nausea And Vomiting  . Morphine Nausea And Vomiting  . Sulfa Antibiotics Nausea And Vomiting and Nausea Only    Review of Systems - Negative except Right above knee amputation, mild dyspnea on exertion, mild leg edema  Blood pressure 160/94, pulse 75, temperature 98 F (36.7 C), temperature source Oral, resp. rate 18,  height 5' 3"  (1.6 m), weight 54.25 kg (119 lb 9.6 oz), SpO2 100 %. General appearance: alert, cooperative, appears stated age and no distress Eyes: negative findings: lids and lashes normal Neck: no adenopathy, no carotid bruit, no JVD, supple, symmetrical, trachea midline and thyroid not enlarged, symmetric, no tenderness/mass/nodules Neck: JVP - normal, carotids 2+= without bruits Resp: Occasional faint right basal crackles present. No wheezing. Chest wall: no tenderness Cardio: S1, S2 normal and 1 to 2/6 systolic ejection murmur in the right sternal border present. Ectopy present. GI: soft, non-tender; bowel sounds normal; no masses,  no organomegaly Extremities: Right leg amputation stump well-healed, right AKA. Left leg full range of movement, trace ankle edema. Pulses: 2+ and symmetric Skin: Skin color, texture, turgor normal. No rashes or lesions Neurologic: Grossly  normal  Results for orders placed or performed during the hospital encounter of 05/17/15 (from the past 48 hour(s))  CBC with Differential     Status: Abnormal   Collection Time: 05/17/15  9:46 PM  Result Value Ref Range   WBC 7.0 4.0 - 10.5 K/uL   RBC 4.14 3.87 - 5.11 MIL/uL   Hemoglobin 12.7 12.0 - 15.0 g/dL   HCT 39.7 36.0 - 46.0 %   MCV 95.9 78.0 - 100.0 fL   MCH 30.7 26.0 - 34.0 pg   MCHC 32.0 30.0 - 36.0 g/dL   RDW 13.8 11.5 - 15.5 %   Platelets 169 150 - 400 K/uL   Neutrophils Relative % 71 43 - 77 %   Neutro Abs 5.0 1.7 - 7.7 K/uL   Lymphocytes Relative 15 12 - 46 %   Lymphs Abs 1.0 0.7 - 4.0 K/uL   Monocytes Relative 13 (H) 3 - 12 %   Monocytes Absolute 0.9 0.1 - 1.0 K/uL   Eosinophils Relative 1 0 - 5 %   Eosinophils Absolute 0.1 0.0 - 0.7 K/uL   Basophils Relative 0 0 - 1 %   Basophils Absolute 0.0 0.0 - 0.1 K/uL  Comprehensive metabolic panel     Status: Abnormal   Collection Time: 05/17/15  9:46 PM  Result Value Ref Range   Sodium 141 135 - 145 mmol/L   Potassium 3.8 3.5 - 5.1 mmol/L   Chloride 107 101 - 111 mmol/L   CO2 24 22 - 32 mmol/L   Glucose, Bld 110 (H) 65 - 99 mg/dL   BUN 17 6 - 20 mg/dL   Creatinine, Ser 1.01 (H) 0.44 - 1.00 mg/dL   Calcium 9.2 8.9 - 10.3 mg/dL   Total Protein 6.5 6.5 - 8.1 g/dL   Albumin 3.8 3.5 - 5.0 g/dL   AST 32 15 - 41 U/L   ALT 18 14 - 54 U/L   Alkaline Phosphatase 62 38 - 126 U/L   Total Bilirubin 1.1 0.3 - 1.2 mg/dL   GFR calc non Af Amer 51 (L) >60 mL/min   GFR calc Af Amer 59 (L) >60 mL/min    Comment: (NOTE) The eGFR has been calculated using the CKD EPI equation. This calculation has not been validated in all clinical situations. eGFR's persistently <60 mL/min signify possible Chronic Kidney Disease.    Anion gap 10 5 - 15  Lipase, blood     Status: Abnormal   Collection Time: 05/17/15  9:46 PM  Result Value Ref Range   Lipase 16 (L) 22 - 51 U/L  I-stat troponin, ED (only if pt is 79 y.o. or older & pain is  above umbilicus)  not at  [facility], [facility]     Status: Abnormal   Collection Time: 05/17/15  9:58 PM  Result Value Ref Range   Troponin i, poc 0.12 (HH) 0.00 - 0.08 ng/mL   Comment NOTIFIED PHYSICIAN    Comment 3            Comment: Due to the release kinetics of cTnI, a negative result within the first hours of the onset of symptoms does not rule out myocardial infarction with certainty. If myocardial infarction is still suspected, repeat the test at appropriate intervals.   Brain natriuretic peptide     Status: Abnormal   Collection Time: 05/17/15  9:58 PM  Result Value Ref Range   B Natriuretic Peptide 3205.8 (H) 0.0 - 100.0 pg/mL  Troponin I     Status: Abnormal   Collection Time: 05/17/15 10:33 PM  Result Value Ref Range   Troponin I 0.13 (H) <0.031 ng/mL    Comment:        PERSISTENTLY INCREASED TROPONIN VALUES IN THE RANGE OF 0.04-0.49 ng/mL CAN BE SEEN IN:       -UNSTABLE ANGINA       -CONGESTIVE HEART FAILURE       -MYOCARDITIS       -CHEST TRAUMA       -ARRYHTHMIAS       -LATE PRESENTING MYOCARDIAL INFARCTION       -COPD   CLINICAL FOLLOW-UP RECOMMENDED.   Urinalysis, Routine w reflex microscopic     Status: Abnormal   Collection Time: 05/18/15 12:24 AM  Result Value Ref Range   Color, Urine YELLOW YELLOW   APPearance CLOUDY (A) CLEAR   Specific Gravity, Urine 1.009 1.005 - 1.030   pH 5.5 5.0 - 8.0   Glucose, UA NEGATIVE NEGATIVE mg/dL   Hgb urine dipstick TRACE (A) NEGATIVE   Bilirubin Urine NEGATIVE NEGATIVE   Ketones, ur NEGATIVE NEGATIVE mg/dL   Protein, ur 30 (A) NEGATIVE mg/dL   Urobilinogen, UA 1.0 0.0 - 1.0 mg/dL   Nitrite NEGATIVE NEGATIVE   Leukocytes, UA MODERATE (A) NEGATIVE  Urine microscopic-add on     Status: Abnormal   Collection Time: 05/18/15 12:24 AM  Result Value Ref Range   Squamous Epithelial / LPF FEW (A) RARE   WBC, UA 21-50 <3 WBC/hpf   RBC / HPF 0-2 <3 RBC/hpf   Bacteria, UA FEW (A) RARE  Troponin I     Status: Abnormal   Collection  Time: 05/18/15  3:35 AM  Result Value Ref Range   Troponin I 0.19 (H) <0.031 ng/mL    Comment:        PERSISTENTLY INCREASED TROPONIN VALUES IN THE RANGE OF 0.04-0.49 ng/mL CAN BE SEEN IN:       -UNSTABLE ANGINA       -CONGESTIVE HEART FAILURE       -MYOCARDITIS       -CHEST TRAUMA       -ARRYHTHMIAS       -LATE PRESENTING MYOCARDIAL INFARCTION       -COPD   CLINICAL FOLLOW-UP RECOMMENDED.   Brain natriuretic peptide     Status: Abnormal   Collection Time: 05/18/15  6:50 AM  Result Value Ref Range   B Natriuretic Peptide 3255.5 (H) 0.0 - 100.0 pg/mL  Basic metabolic panel     Status: Abnormal   Collection Time: 05/18/15  6:50 AM  Result Value Ref Range   Sodium 143 135 - 145 mmol/L   Potassium 3.9 3.5 - 5.1 mmol/L   Chloride  103 101 - 111 mmol/L   CO2 27 22 - 32 mmol/L   Glucose, Bld 110 (H) 65 - 99 mg/dL   BUN 16 6 - 20 mg/dL   Creatinine, Ser 1.04 (H) 0.44 - 1.00 mg/dL   Calcium 9.5 8.9 - 10.3 mg/dL   GFR calc non Af Amer 49 (L) >60 mL/min   GFR calc Af Amer 57 (L) >60 mL/min    Comment: (NOTE) The eGFR has been calculated using the CKD EPI equation. This calculation has not been validated in all clinical situations. eGFR's persistently <60 mL/min signify possible Chronic Kidney Disease.    Anion gap 13 5 - 15  Troponin I     Status: Abnormal   Collection Time: 05/18/15  6:50 AM  Result Value Ref Range   Troponin I 0.18 (H) <0.031 ng/mL    Comment:        PERSISTENTLY INCREASED TROPONIN VALUES IN THE RANGE OF 0.04-0.49 ng/mL CAN BE SEEN IN:       -UNSTABLE ANGINA       -CONGESTIVE HEART FAILURE       -MYOCARDITIS       -CHEST TRAUMA       -ARRYHTHMIAS       -LATE PRESENTING MYOCARDIAL INFARCTION       -COPD   CLINICAL FOLLOW-UP RECOMMENDED.    Dg Chest 2 View  05/17/2015   CLINICAL DATA:  Shortness of breath for 1 day.  EXAM: CHEST  2 VIEW  COMPARISON:  09/23/2014, 06/03/2014  FINDINGS: Dual lead right-sided pacemaker remains in place. Patient is post  median sternotomy. Lower lung volumes accentuating the cardiac size, mild cardiac enlargement. The lungs remain hyperinflated with increased AP dimension. No confluent airspace disease, pulmonary edema, pleural effusion or pneumothorax. The bones are under mineralized. Right proximal humeral prosthesis and postsurgical change in the cervical spine, partially included.  IMPRESSION: 1. Unchanged hyperinflation. 2. No acute pulmonary process.   Electronically Signed   By: Jeb Levering M.D.   On: 05/17/2015 23:30   Ct Head Wo Contrast  05/17/2015   CLINICAL DATA:  Headache.  History of left-sided breast cancer.  EXAM: CT HEAD WITHOUT CONTRAST  TECHNIQUE: Contiguous axial images were obtained from the base of the skull through the vertex without intravenous contrast.  COMPARISON:  None.  FINDINGS: Generalized cerebral and cerebellar atrophy. There is moderate periventricular and deep white matter hypodensity, nonspecific, likely related to chronic small vessel ischemia.No intracranial hemorrhage, mass effect, or midline shift. No hydrocephalus. The basilar cisterns are patent. No evidence of territorial infarct. No intracranial fluid collection. Calvarium is intact. Included paranasal sinuses and mastoid air cells are well aerated.  IMPRESSION: 1.  No acute intracranial abnormality. 2. Atrophy and chronic small vessel ischemia.   Electronically Signed   By: Jeb Levering M.D.   On: 05/17/2015 23:36    Labs:   Lab Results  Component Value Date   WBC 7.0 05/17/2015   HGB 12.7 05/17/2015   HCT 39.7 05/17/2015   MCV 95.9 05/17/2015   PLT 169 05/17/2015    Recent Labs Lab 05/17/15 2146 05/18/15 0650  NA 141 143  K 3.8 3.9  CL 107 103  CO2 24 27  BUN 17 16  CREATININE 1.01* 1.04*  CALCIUM 9.2 9.5  PROT 6.5  --   BILITOT 1.1  --   ALKPHOS 62  --   ALT 18  --   AST 32  --   GLUCOSE 110* 110*    BNP (  last 3 results)  Recent Labs  05/17/15 2158 05/18/15 0650  BNP 3205.8* 3255.5*     ProBNP (last 3 results)  Recent Labs  06/03/14 2035  PROBNP 16254.0*   Cardiac Panel (last 3 results)  Recent Labs  05/17/15 2233 05/18/15 0335 05/18/15 0650  TROPONINI 0.13* 0.19* 0.18*    EKG: A. Fibrillation with V- Paced rhythm. No further analysis due to paced rhythm.  Echocardiogram 03/24/2014: Normal left ventricle systolic function, mild diastolic dysfunction. Severe left medical hypertrophy. Arthritis was evident. Prominent apical hypertrophy. Moderate tricuspid regurgitation. RV systolic pressure estimated to be 69 limit of mercury, consistent with severe pulmonary hypertension  Medications Prior to Admission  Medication Sig Dispense Refill  . acetaminophen (TYLENOL) 500 MG tablet Take 1,000 mg by mouth every 6 (six) hours as needed for moderate pain.     Marland Kitchen albuterol (PROAIR HFA) 108 (90 BASE) MCG/ACT inhaler Inhale 2 puffs into the lungs every 6 (six) hours as needed for wheezing. 1 Inhaler 3  . alendronate (FOSAMAX) 70 MG tablet Take 70 mg by mouth every 7 (seven) days. Take with a full glass of water on an empty stomach. Takes on Thursday    . amLODipine (NORVASC) 2.5 MG tablet Take 2.5 mg by mouth daily.     Marland Kitchen apixaban (ELIQUIS) 2.5 MG TABS tablet Take 1 tablet (2.5 mg total) by mouth 2 (two) times daily. 60 tablet 1  . aspirin 325 MG tablet Take 325 mg by mouth every 6 (six) hours as needed for mild pain.    . calcium citrate-vitamin D (CITRACAL+D) 315-200 MG-UNIT per tablet Take 1 tablet by mouth 2 (two) times daily. 60 tablet 1  . cyclobenzaprine (FLEXERIL) 5 MG tablet Take 1 tablet (5 mg total) by mouth daily. (Patient taking differently: Take 5 mg by mouth daily as needed for muscle spasms. ) 30 tablet 0  . cycloSPORINE (RESTASIS) 0.05 % ophthalmic emulsion Place 1 drop into both eyes 2 (two) times daily. 0.4 mL 1  . fluticasone (FLONASE) 50 MCG/ACT nasal spray Place 2 sprays into both nostrils 2 (two) times daily as needed for allergies.     . furosemide  (LASIX) 20 MG tablet Take 1 tablet (20 mg total) by mouth 2 (two) times daily. 60 tablet 1  . gabapentin (NEURONTIN) 300 MG capsule Take 1 capsule (300 mg total) by mouth 3 (three) times daily. 90 capsule 1  . metoprolol succinate (TOPROL-XL) 25 MG 24 hr tablet Take 1 tablet (25 mg total) by mouth daily. 30 tablet 1  . mirtazapine (REMERON) 15 MG tablet Take 1 tablet (15 mg total) by mouth at bedtime. 30 tablet 1  . Multiple Vitamin (MULTIVITAMIN) tablet Take 1 tablet by mouth 2 (two) times daily.     Marland Kitchen oxyCODONE 10 MG TABS Take 1 tablet (10 mg total) by mouth every 4 (four) hours as needed for severe pain. 90 tablet 0  . potassium chloride SA (K-DUR,KLOR-CON) 20 MEQ tablet Take 1 tablet (20 mEq total) by mouth 2 (two) times daily. 60 tablet 1  . traMADol (ULTRAM) 50 MG tablet Take 50 mg by mouth every 6 (six) hours as needed for moderate pain.       Current facility-administered medications:  .  0.9 %  sodium chloride infusion, 250 mL, Intravenous, PRN, Adrian Prows, MD .  acetaminophen (TYLENOL) tablet 650 mg, 650 mg, Oral, Q4H PRN, Adrian Prows, MD .  apixaban (ELIQUIS) tablet 2.5 mg, 2.5 mg, Oral, BID, Adrian Prows, MD .  diltiazem (CARDIZEM  CD) 24 hr capsule 180 mg, 180 mg, Oral, Daily, Adrian Prows, MD .  lisinopril (PRINIVIL,ZESTRIL) tablet 5 mg, 5 mg, Oral, Daily, Adrian Prows, MD .  metoprolol tartrate (LOPRESSOR) tablet 25 mg, 25 mg, Oral, Q12H, Adrian Prows, MD .  ondansetron [facility]) injection 4 mg, 4 mg, Intravenous, Q6H PRN, Adrian Prows, MD .  sodium chloride 0.9 % injection 3 mL, 3 mL, Intravenous, Q12H, Adrian Prows, MD .  sodium chloride 0.9 % injection 3 mL, 3 mL, Intravenous, PRN, Adrian Prows, MD  Assessment/Plan 1. Acute on Chronic Diastolic Heart failure 2. Secondary troponin leak due to CHF 3. SSS S/P PTVP insertion, h/o complete heart block. Medtronic Adapta dual-chamber pacemaker implantation, programmed to VVI due to permanent atrial fibrillation implanted 2006 with gen change by Dr Lovena Le  11/27/2013. 4. Chronic anticoagulation on Eliquis. 5. Chronic stage 3 kidney disease due to hypertension. 6. Right AKA status. 7. Severe pulmonary hypertension by echocardiogram. 8. History of hypertrophic cardiomyopathy, status post myomectomy about 11 years ago at [facility].  Rec: Patient had excellent diuresis overnight. The leg edema has completely resolved. She states that her dyspnea has also completely resolved. Husband present at the bedside, patient requests that she be discharged home. I will see her in the office in 1 week to 10 days and obtain outpatient echocardiogram at the same time. She is stable for discharge, still has elevated BNP, but expect resolution. I discussed with her regarding avoidance of excessive salt intake and to call us immediately if she starts noticing worsening dyspnea or leg edema. Daily weights were also discussed with the patient.  Adrian Prows, MD 05/18/2015, 9:00 AM [facility]. [place] Pager: [phone] Office: [phone] If no answer: Cell:  [phone]

## 2015-05-18 NOTE — Progress Notes (Signed)
Discharge instructions given. Pt verbalized understanding and all questions were answered.  

## 2015-05-18 NOTE — ED Notes (Signed)
Cardiology at bedside.

## 2015-05-18 NOTE — Discharge Summary (Signed)
Physician Discharge Summary  Patient ID: [name] MRN: [id number] DOB/AGE: 08/22/1931 81 y.o.  Admit date: 05/17/2015 Discharge date: 05/18/2015  Primary Discharge Diagnosis 1. Acute on Chronic Diastolic Heart failure  Secondary Discharge Diagnosis 2. Secondary troponin leak due to CHF 3. SSS S/P PTVP insertion, h/o complete heart block. Medtronic Adapta dual-chamber pacemaker implantation, programmed to VVI due to permanent atrial fibrillation implanted 2006 with gen change by Dr Lovena Le 11/27/2013. 4. Chronic anticoagulation on Eliquis. 5. Chronic stage 3 kidney disease due to hypertension. 6. Right AKA status. 7. Severe pulmonary hypertension by echocardiogram. 8. History of hypertrophic cardiomyopathy, status post myomectomy about 11 years ago at [facility].  Significant Diagnostic Studies:  Hospital Course:  Chief Complaint: leg edema and dyspnea HPI: [name] is a 81 y.o. female with a history of complete heart block, Chronic A. Fibrillation and status post permanent pacemaker insertion, hypertension, and arthritis.She has right AKA on 09/17/2014 for non healing ulcer of the leg.   She was admitted via emergency room last night. This morning patient states that her leg edema has completely resolved, no more orthopnea, except she has cramping in her legs. Husband present the bedside. No other specific complaints.  Recommendations on discharge: Discontinue amlodipine, start and change to Cardizem CD to improve heart rate and diastolic function. We'll set her up outpatient office visit, follow up in 10 days.  Discharge Exam: Blood pressure 160/94, pulse 75, temperature 98 F (36.7 C), temperature source Oral, resp. rate 18, height 5\' 3"  (1.6 m), weight 54.25 kg (119 lb 9.6 oz), SpO2 100 %. General appearance: alert, cooperative, appears stated age and no distress Eyes: negative findings: lids and lashes normal Neck: no adenopathy, no carotid bruit, no JVD, supple, symmetrical,  trachea midline and thyroid not enlarged, symmetric, no tenderness/mass/nodules Neck: JVP - normal, carotids 2+= without bruits Resp: Occasional faint right basal crackles present. No wheezing. Chest wall: no tenderness Cardio: S1, S2 normal and 1 to 2/6 systolic ejection murmur in the right sternal border present. Ectopy present. GI: soft, non-tender; bowel sounds normal; no masses, no organomegaly Extremities: Right leg amputation stump well-healed, right AKA. Left leg full range of movement, trace ankle edema. Pulses: 2+ and symmetric Skin: Skin color, texture, turgor normal. No rashes or lesions Neurologic: Grossly normal Labs:   Lab Results  Component Value Date   WBC 7.0 05/17/2015   HGB 12.7 05/17/2015   HCT 39.7 05/17/2015   MCV 95.9 05/17/2015   PLT 169 05/17/2015    Recent Labs Lab 05/17/15 2146 05/18/15 0650  NA 141 143  K 3.8 3.9  CL 107 103  CO2 24 27  BUN 17 16  CREATININE 1.01* 1.04*  CALCIUM 9.2 9.5  PROT 6.5  --   BILITOT 1.1  --   ALKPHOS 62  --   ALT 18  --   AST 32  --   GLUCOSE 110* 110*    BNP (last 3 results)  Recent Labs  05/17/15 2158 05/18/15 0650  BNP 3205.8* 3255.5*    ProBNP (last 3 results)  Recent Labs  06/03/14 2035  PROBNP 16254.0*    HEMOGLOBIN A1C No results found for: HGBA1C, MPG  Cardiac Panel (last 3 results)  Recent Labs  05/17/15 2233 05/18/15 0335 05/18/15 0650  TROPONINI 0.13* 0.19* 0.18*    Lab Results  Component Value Date   TROPONINI 0.18* 05/18/2015    EKG: A. Fibrillation with V- Paced rhythm. No further analysis due to paced rhythm.  Echocardiogram 03/24/2014: Normal  left ventricle systolic function, mild diastolic dysfunction. Severe left medical hypertrophy. Arthritis was evident. Prominent apical hypertrophy. Moderate tricuspid regurgitation. RV systolic pressure estimated to be 69 limit of mercury, consistent with severe pulmonary hypertension  Radiology: Dg Chest 2 View  05/17/2015    CLINICAL DATA:  Shortness of breath for 1 day.  EXAM: CHEST  2 VIEW  COMPARISON:  09/23/2014, 06/03/2014  FINDINGS: Dual lead right-sided pacemaker remains in place. Patient is post median sternotomy. Lower lung volumes accentuating the cardiac size, mild cardiac enlargement. The lungs remain hyperinflated with increased AP dimension. No confluent airspace disease, pulmonary edema, pleural effusion or pneumothorax. The bones are under mineralized. Right proximal humeral prosthesis and postsurgical change in the cervical spine, partially included.  IMPRESSION: 1. Unchanged hyperinflation. 2. No acute pulmonary process.   Electronically Signed   By: Jeb Levering M.D.   On: 05/17/2015 23:30   Ct Head Wo Contrast  05/17/2015   CLINICAL DATA:  Headache.  History of left-sided breast cancer.  EXAM: CT HEAD WITHOUT CONTRAST  TECHNIQUE: Contiguous axial images were obtained from the base of the skull through the vertex without intravenous contrast.  COMPARISON:  None.  FINDINGS: Generalized cerebral and cerebellar atrophy. There is moderate periventricular and deep white matter hypodensity, nonspecific, likely related to chronic small vessel ischemia.No intracranial hemorrhage, mass effect, or midline shift. No hydrocephalus. The basilar cisterns are patent. No evidence of territorial infarct. No intracranial fluid collection. Calvarium is intact. Included paranasal sinuses and mastoid air cells are well aerated.  IMPRESSION: 1.  No acute intracranial abnormality. 2. Atrophy and chronic small vessel ischemia.   Electronically Signed   By: Jeb Levering M.D.   On: 05/17/2015 23:36    FOLLOW UP PLANS AND APPOINTMENTS: To call office to be seen in 10 days and Echo in the office.    Medication List    STOP taking these medications        amLODipine 2.5 MG tablet  Commonly known as:  NORVASC      TAKE these medications        acetaminophen 500 MG tablet  Commonly known as:  TYLENOL  Take 1,000 mg by  mouth every 6 (six) hours as needed for moderate pain.     albuterol 108 (90 BASE) MCG/ACT inhaler  Commonly known as:  PROAIR HFA  Inhale 2 puffs into the lungs every 6 (six) hours as needed for wheezing.     alendronate 70 MG tablet  Commonly known as:  FOSAMAX  Take 70 mg by mouth every 7 (seven) days. Take with a full glass of water on an empty stomach. Takes on Thursday     apixaban 2.5 MG Tabs tablet  Commonly known as:  ELIQUIS  Take 1 tablet (2.5 mg total) by mouth 2 (two) times daily.     aspirin 325 MG tablet  Take 325 mg by mouth every 6 (six) hours as needed for mild pain.     calcium citrate-vitamin D 315-200 MG-UNIT per tablet  Commonly known as:  CITRACAL+D  Take 1 tablet by mouth 2 (two) times daily.     cyclobenzaprine 5 MG tablet  Commonly known as:  FLEXERIL  Take 1 tablet (5 mg total) by mouth daily.     cycloSPORINE 0.05 % ophthalmic emulsion  Commonly known as:  RESTASIS  Place 1 drop into both eyes 2 (two) times daily.     diltiazem 180 MG 24 hr capsule  Commonly known as:  CARDIZEM CD  Take 1 capsule (180 mg total) by mouth daily.     fluticasone 50 MCG/ACT nasal spray  Commonly known as:  FLONASE  Place 2 sprays into both nostrils 2 (two) times daily as needed for allergies.     furosemide 20 MG tablet  Commonly known as:  LASIX  Take 1 tablet (20 mg total) by mouth 2 (two) times daily.     gabapentin 300 MG capsule  Commonly known as:  NEURONTIN  Take 1 capsule (300 mg total) by mouth 3 (three) times daily.     metoprolol succinate 25 MG 24 hr tablet  Commonly known as:  TOPROL-XL  Take 1 tablet (25 mg total) by mouth daily.     mirtazapine 15 MG tablet  Commonly known as:  REMERON  Take 1 tablet (15 mg total) by mouth at bedtime.     multivitamin tablet  Take 1 tablet by mouth 2 (two) times daily.     Oxycodone HCl 10 MG Tabs  Take 1 tablet (10 mg total) by mouth every 4 (four) hours as needed for severe pain.     potassium  chloride SA 20 MEQ tablet  Commonly known as:  K-DUR,KLOR-CON  Take 1 tablet (20 mEq total) by mouth 2 (two) times daily.     traMADol 50 MG tablet  Commonly known as:  ULTRAM  Take 50 mg by mouth every 6 (six) hours as needed for moderate pain.        Adrian Prows, MD 05/18/2015, 11:31 AM  Pager: [phone] Office: [phone] If no answer: [phone]

## 2015-05-18 NOTE — Progress Notes (Signed)
   05/18/15 0500  Vitals  Temp 98 F (36.7 C)  Temp Source Oral  BP (!) 160/94 mmHg  BP Location Right Arm  BP Method Automatic  Patient Position (if appropriate) Lying  Pulse Rate 75  Pulse Rate Source Dinamap  Resp 18  Oxygen Therapy  SpO2 100 %  O2 Device Room Air  Height and Weight  Weight 54.25 kg (119 lb 9.6 oz)  Admitted pt to rm 3E15 from ED via stretcher, pt alert and oriented, denied pain at this time, oriented to room, call bell placed within reach, assessment done. Dr. Einar Gip notified for admission orders.

## 2015-06-03 DIAGNOSIS — H40013 Open angle with borderline findings, low risk, bilateral: Secondary | ICD-10-CM | POA: Diagnosis not present

## 2015-06-03 DIAGNOSIS — H3531 Nonexudative age-related macular degeneration: Secondary | ICD-10-CM | POA: Diagnosis not present

## 2015-06-03 DIAGNOSIS — H35033 Hypertensive retinopathy, bilateral: Secondary | ICD-10-CM | POA: Diagnosis not present

## 2015-06-03 DIAGNOSIS — H2513 Age-related nuclear cataract, bilateral: Secondary | ICD-10-CM | POA: Diagnosis not present

## 2015-06-05 ENCOUNTER — Other Ambulatory Visit: Payer: Self-pay | Admitting: Internal Medicine

## 2015-06-10 DIAGNOSIS — Z950 Presence of cardiac pacemaker: Secondary | ICD-10-CM | POA: Diagnosis not present

## 2015-06-10 DIAGNOSIS — I509 Heart failure, unspecified: Secondary | ICD-10-CM | POA: Diagnosis not present

## 2015-06-10 DIAGNOSIS — R0602 Shortness of breath: Secondary | ICD-10-CM | POA: Diagnosis not present

## 2015-06-10 DIAGNOSIS — I482 Chronic atrial fibrillation: Secondary | ICD-10-CM | POA: Diagnosis not present

## 2015-06-10 DIAGNOSIS — Z8679 Personal history of other diseases of the circulatory system: Secondary | ICD-10-CM | POA: Diagnosis not present

## 2015-06-10 DIAGNOSIS — I272 Other secondary pulmonary hypertension: Secondary | ICD-10-CM | POA: Diagnosis not present

## 2015-06-29 ENCOUNTER — Encounter: Payer: Self-pay | Admitting: Hematology

## 2015-06-29 ENCOUNTER — Ambulatory Visit: Payer: Medicare Other | Admitting: Hematology

## 2015-06-29 ENCOUNTER — Telehealth: Payer: Self-pay | Admitting: Hematology

## 2015-06-29 ENCOUNTER — Ambulatory Visit (HOSPITAL_BASED_OUTPATIENT_CLINIC_OR_DEPARTMENT_OTHER): Payer: Medicare Other | Admitting: Hematology

## 2015-06-29 ENCOUNTER — Other Ambulatory Visit (HOSPITAL_BASED_OUTPATIENT_CLINIC_OR_DEPARTMENT_OTHER): Payer: Medicare Other

## 2015-06-29 VITALS — BP 121/75 | HR 85 | Temp 97.5°F | Resp 18 | Ht 63.0 in | Wt 120.6 lb

## 2015-06-29 DIAGNOSIS — D0512 Intraductal carcinoma in situ of left breast: Secondary | ICD-10-CM

## 2015-06-29 DIAGNOSIS — C50912 Malignant neoplasm of unspecified site of left female breast: Secondary | ICD-10-CM

## 2015-06-29 DIAGNOSIS — Z853 Personal history of malignant neoplasm of breast: Secondary | ICD-10-CM

## 2015-06-29 LAB — COMPREHENSIVE METABOLIC PANEL (CC13)
ALT: 9 U/L (ref 0–55)
AST: 18 U/L (ref 5–34)
Albumin: 3.4 g/dL — ABNORMAL LOW (ref 3.5–5.0)
Alkaline Phosphatase: 63 U/L (ref 40–150)
Anion Gap: 7 mEq/L (ref 3–11)
BUN: 15.8 mg/dL (ref 7.0–26.0)
CO2: 26 mEq/L (ref 22–29)
Calcium: 9.4 mg/dL (ref 8.4–10.4)
Chloride: 107 mEq/L (ref 98–109)
Creatinine: 1 mg/dL (ref 0.6–1.1)
EGFR: 54 mL/min/{1.73_m2} — ABNORMAL LOW (ref >90)
Glucose: 122 mg/dl (ref 70–140)
Potassium: 4.6 mEq/L (ref 3.5–5.1)
Sodium: 140 mEq/L (ref 136–145)
Total Bilirubin: 0.45 mg/dL (ref 0.20–1.20)
Total Protein: 6.3 g/dL — ABNORMAL LOW (ref 6.4–8.3)

## 2015-06-29 LAB — CBC WITH DIFFERENTIAL/PLATELET
BASO%: 0.9 % (ref 0.0–2.0)
Basophils Absolute: 0.1 10*3/uL (ref 0.0–0.1)
EOS%: 1 % (ref 0.0–7.0)
Eosinophils Absolute: 0.1 10*3/uL (ref 0.0–0.5)
HCT: 39.4 % (ref 34.8–46.6)
HGB: 12.9 g/dL (ref 11.6–15.9)
LYMPH%: 14.8 % (ref 14.0–49.7)
MCH: 31.6 pg (ref 25.1–34.0)
MCHC: 32.6 g/dL (ref 31.5–36.0)
MCV: 96.7 fL (ref 79.5–101.0)
MONO#: 0.5 10*3/uL (ref 0.1–0.9)
MONO%: 8.3 % (ref 0.0–14.0)
NEUT#: 4.9 10*3/uL (ref 1.5–6.5)
NEUT%: 75 % (ref 38.4–76.8)
Platelets: 207 10*3/uL (ref 145–400)
RBC: 4.07 10*6/uL (ref 3.70–5.45)
RDW: 16.9 % — ABNORMAL HIGH (ref 11.2–14.5)
WBC: 6.5 10*3/uL (ref 3.9–10.3)
lymph#: 1 10*3/uL (ref 0.9–3.3)

## 2015-06-29 NOTE — Telephone Encounter (Signed)
Pt confirmed labs/ov per 07/11 POF, gave pt AVS and Calendar.... KJ °

## 2015-06-29 NOTE — Progress Notes (Addendum)
[facility] OFFICE PROGRESS NOTE  CC  Sharon Gravel, MD [address] [place] [address]  DIAGNOSIS: 81 year old female with DCIS of the left breast diagnosed 04/05/2011. The tumor was ER positive PR positive  PRIOR THERAPY:  #1 patient is status post lumpectomy on 04/05/2011 for a high-grade DCIS that was ER and PR positive.  #2 she then underwent radiation therapy from 05/23/2011 2 06/26/2011 for a total of 52.5 gray.  #3 she was then begun on Aromasin 25 mg daily starting 07/18/2011. A total of 5 years of therapy is planned.  This was discontinued in April 2014 due to continued worsening in osteoporosis.    CURRENT THERAPY: Observation  INTERVAL HISTORY: [name] 81 y.o. female returns for followup visit at 6 months.  She is doing well overall.  She had right  Leg prosthesis installed last December, she is able to walk with a walker. She denies any significant pain, dyspnea, or other discomfort. She lives with her husband and other family members.  She is able to do most of her self care.    MEDICAL HISTORY: Past Medical History  Diagnosis Date  . Mitral regurgitation   . Hypertrophic obstructive cardiomyopathy     Required septal myotomy  . Urinary incontinence   . Cystitis   . Hemorrhoids   . Bronchitis   . Asthma   . Anemia   . Heart block     Complete heart block post surgical requiring pacer  . Hypokalemia     Postoperative, improved  . Hyponatremia     Mild postoperative, improved  . Blood transfusion   . Constipation   . Arthritis pain   . Cancer     lt breast  . Hypertension     Benign Essential Hypertension    . Current use of long term anticoagulation   . Chronic atrial fibrillation   . Arthritis   . Pacemaker   . Memory difficulty     ALLERGIES:  is allergic to ceftaroline; codeine; morphine; and sulfa antibiotics.  MEDICATIONS:  Current Outpatient Prescriptions  Medication Sig Dispense Refill  .  acetaminophen (TYLENOL) 500 MG tablet Take 1,000 mg by mouth every 6 (six) hours as needed for moderate pain.     Marland Kitchen albuterol (PROAIR HFA) 108 (90 BASE) MCG/ACT inhaler Inhale 2 puffs into the lungs every 6 (six) hours as needed for wheezing. 1 Inhaler 3  . alendronate (FOSAMAX) 70 MG tablet Take 70 mg by mouth every 7 (seven) days. Take with a full glass of water on an empty stomach. Takes on Thursday    . apixaban (ELIQUIS) 2.5 MG TABS tablet Take 1 tablet (2.5 mg total) by mouth 2 (two) times daily. 60 tablet 1  . aspirin 325 MG tablet Take 325 mg by mouth every 6 (six) hours as needed for mild pain.    . calcium citrate-vitamin D (CITRACAL+D) 315-200 MG-UNIT per tablet Take 1 tablet by mouth 2 (two) times daily. 60 tablet 1  . cyclobenzaprine (FLEXERIL) 5 MG tablet Take 1 tablet (5 mg total) by mouth daily. (Patient taking differently: Take 5 mg by mouth daily as needed for muscle spasms. ) 30 tablet 0  . cycloSPORINE (RESTASIS) 0.05 % ophthalmic emulsion Place 1 drop into both eyes 2 (two) times daily. 0.4 mL 1  . diltiazem (CARDIZEM CD) 180 MG 24 hr capsule Take 1 capsule (180 mg total) by mouth daily. 30 capsule 1  . ELIQUIS 2.5 MG TABS tablet TAKE  1 TABLET TWICE A DAY (PLEASE CALL AND SCHEDULE AN APPOINTMENT FOR JUNE) 180 tablet 0  . fluticasone (FLONASE) 50 MCG/ACT nasal spray Place 2 sprays into both nostrils 2 (two) times daily as needed for allergies.     . furosemide (LASIX) 20 MG tablet Take 1 tablet (20 mg total) by mouth 2 (two) times daily. 60 tablet 1  . gabapentin (NEURONTIN) 300 MG capsule Take 1 capsule (300 mg total) by mouth 3 (three) times daily. 90 capsule 1  . metoprolol succinate (TOPROL-XL) 25 MG 24 hr tablet Take 1 tablet (25 mg total) by mouth daily. 30 tablet 1  . mirtazapine (REMERON) 15 MG tablet Take 1 tablet (15 mg total) by mouth at bedtime. 30 tablet 1  . Multiple Vitamin (MULTIVITAMIN) tablet Take 1 tablet by mouth 2 (two) times daily.     Marland Kitchen oxyCODONE 10 MG TABS  Take 1 tablet (10 mg total) by mouth every 4 (four) hours as needed for severe pain. 90 tablet 0  . potassium chloride SA (K-DUR,KLOR-CON) 20 MEQ tablet Take 1 tablet (20 mEq total) by mouth 2 (two) times daily. 60 tablet 1  . traMADol (ULTRAM) 50 MG tablet Take 50 mg by mouth every 6 (six) hours as needed for moderate pain.      No current facility-administered medications for this visit.    SURGICAL HISTORY:  Past Surgical History  Procedure Laterality Date  . Anterior cervical diskectomy and fusion      C-7  . Septal myotomy    . Pacemaker placement  2006/11-27-2013    MDT dual chamber pacemaker implanted 2006 with gen change by Dr Lovena Le 11-27-2013  . Replacement total knee bilateral    . Femur fracture surgery      Right  . Wrist fracture surgery      Right  . Shoulder surgery      Right  . Breast biopsy      Left, negative  . Colonoscopy    . Abdominal hysterectomy    . Cardiomyopathy    . Breast lumpectomy  2012    left breast  . Joint replacement    . Permanent pacemaker generator change N/A 11/27/2013    Procedure: PERMANENT PACEMAKER GENERATOR CHANGE;  Surgeon: Evans Lance, MD;  Location: [facility] CATH LAB;  Service: Cardiovascular;  Laterality: N/A;  . Below knee leg amputation Right     REVIEW OF SYSTEMS:  A 10 point review of systems was conducted and is otherwise negative except for what is noted above.     HEALTH MAINTENANCE:  Mammogram: 12/2014, normal Colonoscopy: 4 years ago, no follow up Bone Density: 02/2013, osteoporosis on Fosamax Pap Smear:s/p TAH, several years Eye Exam: 10/2012 Vitamin D: 04/2013 Lipid Panel: 05/2012  PHYSICAL EXAMINATION: Blood pressure 121/75, pulse 85, temperature 97.5 F (36.4 C), temperature source Oral, resp. rate 18, height 5\' 3"  (1.6 m), weight 120 lb 9.6 oz (54.704 kg), SpO2 96 %. Body mass index is 21.37 kg/(m^2). General: Patient is a well appearing female in no acute distress HEENT: PERRLA, sclerae anicteric no  conjunctival pallor, MMM Neck: supple, no palpable adenopathy Lungs: clear to auscultation bilaterally, no wheezes, rhonchi, or rales Cardiovascular: regular rate rhythm, S1, S2, no murmurs, rubs or gallops Abdomen: Soft, non-tender, non-distended, normoactive bowel sounds, no HSM Extremities: warm and well perfused, no clubbing, cyanosis, or edema Skin: No rashes or lesions Neuro: Non-focal Bilateral breast exam no masses nipple discharge left breast reveals well-healed incisional scar there is noted to be  scar tissue but no discrete masses. Right breast no masses or nipple discharge.  ECOG PERFORMANCE STATUS: 2-3    LABORATORY DATA: CBC Latest Ref Rng 06/29/2015 05/17/2015 12/29/2014  WBC 3.9 - 10.3 10e3/uL 6.5 7.0 6.5  Hemoglobin 11.6 - 15.9 g/dL 12.9 12.7 12.6  Hematocrit 34.8 - 46.6 % 39.4 39.7 41.0  Platelets 145 - 400 10e3/uL 207 169 208    CMP Latest Ref Rng 06/29/2015 05/18/2015 05/18/2015  Glucose 70 - 140 mg/dl 122 98 110(H)  BUN 7.0 - 26.0 mg/dL 15.8 20 16   Creatinine 0.6 - 1.1 mg/dL 1.0 0.98 1.04(H)  Sodium 136 - 145 mEq/L 140 144 143  Potassium 3.5 - 5.1 mEq/L 4.6 3.9 3.9  Chloride 101 - 111 mmol/L - 102 103  CO2 22 - 29 mEq/L 26 28 27   Calcium 8.4 - 10.4 mg/dL 9.4 9.9 9.5  Total Protein 6.4 - 8.3 g/dL 6.3(L) 7.5 -  Total Bilirubin 0.20 - 1.20 mg/dL 0.45 1.5(H) -  Alkaline Phos 40 - 150 U/L 63 68 -  AST 5 - 34 U/L 18 36 -  ALT 0 - 55 U/L 9 22 -        RADIOGRAPHIC STUDIES:  No results found.  ASSESSMENT AND PLAN: 81 year old female with DCIS of the left breast status post lumpectomy followed by radiation previously on Aromasin as a chemopreventive 25 mg daily which she stopped due to worsenng osteoporosis. She is taking vitamin D and she has her vitamin D levels checked at her primary care physician's office. She has no evidence of recurrent disease.  1. Left breast DCIS, high grade, ER+/PR+ - her breast DCIS was cured by complete surgical  resection. -clinically doing well, no evidence of recurrence -Continue surveillance.  Her last mammogram in January 2016 was normal -continue Calcium, Vit D and fosamax for her osteoporosis. - I encouraged her to be physically active as much as she can  She will continue follow-up with her primary care physician and also orthopedic surgeon.  Follow-up: 1 year.   Next mammogram in January 2017. If this is normal, okay to stop screening due to her advanced age,  And I will continue follow-up clinically.   Truitt Merle  06/29/2015, 1:48 PM

## 2015-07-02 ENCOUNTER — Encounter: Payer: Self-pay | Admitting: Hematology

## 2015-07-06 DIAGNOSIS — H2513 Age-related nuclear cataract, bilateral: Secondary | ICD-10-CM | POA: Diagnosis not present

## 2015-07-06 DIAGNOSIS — H25013 Cortical age-related cataract, bilateral: Secondary | ICD-10-CM | POA: Diagnosis not present

## 2015-07-06 DIAGNOSIS — H3531 Nonexudative age-related macular degeneration: Secondary | ICD-10-CM | POA: Diagnosis not present

## 2015-07-06 DIAGNOSIS — H34839 Tributary (branch) retinal vein occlusion, unspecified eye: Secondary | ICD-10-CM | POA: Diagnosis not present

## 2015-07-15 DIAGNOSIS — Z950 Presence of cardiac pacemaker: Secondary | ICD-10-CM | POA: Diagnosis not present

## 2015-07-21 DIAGNOSIS — H2511 Age-related nuclear cataract, right eye: Secondary | ICD-10-CM | POA: Diagnosis not present

## 2015-07-29 DIAGNOSIS — Z89611 Acquired absence of right leg above knee: Secondary | ICD-10-CM | POA: Diagnosis not present

## 2015-07-29 DIAGNOSIS — Z4781 Encounter for orthopedic aftercare following surgical amputation: Secondary | ICD-10-CM | POA: Diagnosis not present

## 2015-08-13 DIAGNOSIS — H25012 Cortical age-related cataract, left eye: Secondary | ICD-10-CM | POA: Diagnosis not present

## 2015-08-13 DIAGNOSIS — H2512 Age-related nuclear cataract, left eye: Secondary | ICD-10-CM | POA: Diagnosis not present

## 2015-08-18 DIAGNOSIS — H2512 Age-related nuclear cataract, left eye: Secondary | ICD-10-CM | POA: Diagnosis not present

## 2015-08-26 DIAGNOSIS — R609 Edema, unspecified: Secondary | ICD-10-CM | POA: Diagnosis not present

## 2015-08-26 DIAGNOSIS — J45909 Unspecified asthma, uncomplicated: Secondary | ICD-10-CM | POA: Diagnosis not present

## 2015-08-26 DIAGNOSIS — S81802A Unspecified open wound, left lower leg, initial encounter: Secondary | ICD-10-CM | POA: Diagnosis not present

## 2015-08-28 DIAGNOSIS — I10 Essential (primary) hypertension: Secondary | ICD-10-CM | POA: Diagnosis not present

## 2015-08-28 DIAGNOSIS — R609 Edema, unspecified: Secondary | ICD-10-CM | POA: Diagnosis not present

## 2015-08-28 DIAGNOSIS — Z89611 Acquired absence of right leg above knee: Secondary | ICD-10-CM | POA: Diagnosis not present

## 2015-08-28 DIAGNOSIS — L03116 Cellulitis of left lower limb: Secondary | ICD-10-CM | POA: Diagnosis not present

## 2015-08-28 DIAGNOSIS — J45909 Unspecified asthma, uncomplicated: Secondary | ICD-10-CM | POA: Diagnosis not present

## 2015-08-28 DIAGNOSIS — S81802D Unspecified open wound, left lower leg, subsequent encounter: Secondary | ICD-10-CM | POA: Diagnosis not present

## 2015-08-31 DIAGNOSIS — Z8679 Personal history of other diseases of the circulatory system: Secondary | ICD-10-CM | POA: Diagnosis not present

## 2015-08-31 DIAGNOSIS — I10 Essential (primary) hypertension: Secondary | ICD-10-CM | POA: Diagnosis not present

## 2015-08-31 DIAGNOSIS — I482 Chronic atrial fibrillation: Secondary | ICD-10-CM | POA: Diagnosis not present

## 2015-08-31 DIAGNOSIS — Z950 Presence of cardiac pacemaker: Secondary | ICD-10-CM | POA: Diagnosis not present

## 2015-08-31 DIAGNOSIS — S81802D Unspecified open wound, left lower leg, subsequent encounter: Secondary | ICD-10-CM | POA: Diagnosis not present

## 2015-08-31 DIAGNOSIS — R609 Edema, unspecified: Secondary | ICD-10-CM | POA: Diagnosis not present

## 2015-08-31 DIAGNOSIS — L03116 Cellulitis of left lower limb: Secondary | ICD-10-CM | POA: Diagnosis not present

## 2015-08-31 DIAGNOSIS — J45909 Unspecified asthma, uncomplicated: Secondary | ICD-10-CM | POA: Diagnosis not present

## 2015-08-31 DIAGNOSIS — Z89611 Acquired absence of right leg above knee: Secondary | ICD-10-CM | POA: Diagnosis not present

## 2015-09-01 DIAGNOSIS — L0390 Cellulitis, unspecified: Secondary | ICD-10-CM | POA: Diagnosis not present

## 2015-09-02 ENCOUNTER — Other Ambulatory Visit: Payer: Self-pay | Admitting: Internal Medicine

## 2015-09-02 DIAGNOSIS — S81802D Unspecified open wound, left lower leg, subsequent encounter: Secondary | ICD-10-CM | POA: Diagnosis not present

## 2015-09-02 DIAGNOSIS — R609 Edema, unspecified: Secondary | ICD-10-CM | POA: Diagnosis not present

## 2015-09-02 DIAGNOSIS — L03116 Cellulitis of left lower limb: Secondary | ICD-10-CM | POA: Diagnosis not present

## 2015-09-02 DIAGNOSIS — I10 Essential (primary) hypertension: Secondary | ICD-10-CM | POA: Diagnosis not present

## 2015-09-02 DIAGNOSIS — Z89611 Acquired absence of right leg above knee: Secondary | ICD-10-CM | POA: Diagnosis not present

## 2015-09-02 DIAGNOSIS — J45909 Unspecified asthma, uncomplicated: Secondary | ICD-10-CM | POA: Diagnosis not present

## 2015-09-04 DIAGNOSIS — Z89611 Acquired absence of right leg above knee: Secondary | ICD-10-CM | POA: Diagnosis not present

## 2015-09-04 DIAGNOSIS — R609 Edema, unspecified: Secondary | ICD-10-CM | POA: Diagnosis not present

## 2015-09-04 DIAGNOSIS — J45909 Unspecified asthma, uncomplicated: Secondary | ICD-10-CM | POA: Diagnosis not present

## 2015-09-04 DIAGNOSIS — L03116 Cellulitis of left lower limb: Secondary | ICD-10-CM | POA: Diagnosis not present

## 2015-09-04 DIAGNOSIS — I10 Essential (primary) hypertension: Secondary | ICD-10-CM | POA: Diagnosis not present

## 2015-09-04 DIAGNOSIS — S81802D Unspecified open wound, left lower leg, subsequent encounter: Secondary | ICD-10-CM | POA: Diagnosis not present

## 2015-09-09 DIAGNOSIS — J45909 Unspecified asthma, uncomplicated: Secondary | ICD-10-CM | POA: Diagnosis not present

## 2015-09-09 DIAGNOSIS — Z89611 Acquired absence of right leg above knee: Secondary | ICD-10-CM | POA: Diagnosis not present

## 2015-09-09 DIAGNOSIS — L03116 Cellulitis of left lower limb: Secondary | ICD-10-CM | POA: Diagnosis not present

## 2015-09-09 DIAGNOSIS — R609 Edema, unspecified: Secondary | ICD-10-CM | POA: Diagnosis not present

## 2015-09-09 DIAGNOSIS — I10 Essential (primary) hypertension: Secondary | ICD-10-CM | POA: Diagnosis not present

## 2015-09-09 DIAGNOSIS — S81802D Unspecified open wound, left lower leg, subsequent encounter: Secondary | ICD-10-CM | POA: Diagnosis not present

## 2015-09-11 DIAGNOSIS — I10 Essential (primary) hypertension: Secondary | ICD-10-CM | POA: Diagnosis not present

## 2015-09-11 DIAGNOSIS — R609 Edema, unspecified: Secondary | ICD-10-CM | POA: Diagnosis not present

## 2015-09-11 DIAGNOSIS — Z89611 Acquired absence of right leg above knee: Secondary | ICD-10-CM | POA: Diagnosis not present

## 2015-09-11 DIAGNOSIS — S81802D Unspecified open wound, left lower leg, subsequent encounter: Secondary | ICD-10-CM | POA: Diagnosis not present

## 2015-09-11 DIAGNOSIS — J45909 Unspecified asthma, uncomplicated: Secondary | ICD-10-CM | POA: Diagnosis not present

## 2015-09-11 DIAGNOSIS — L03116 Cellulitis of left lower limb: Secondary | ICD-10-CM | POA: Diagnosis not present

## 2015-09-14 DIAGNOSIS — I10 Essential (primary) hypertension: Secondary | ICD-10-CM | POA: Diagnosis not present

## 2015-09-14 DIAGNOSIS — R609 Edema, unspecified: Secondary | ICD-10-CM | POA: Diagnosis not present

## 2015-09-14 DIAGNOSIS — J45909 Unspecified asthma, uncomplicated: Secondary | ICD-10-CM | POA: Diagnosis not present

## 2015-09-14 DIAGNOSIS — Z89611 Acquired absence of right leg above knee: Secondary | ICD-10-CM | POA: Diagnosis not present

## 2015-09-14 DIAGNOSIS — R269 Unspecified abnormalities of gait and mobility: Secondary | ICD-10-CM | POA: Diagnosis not present

## 2015-09-14 DIAGNOSIS — S81802D Unspecified open wound, left lower leg, subsequent encounter: Secondary | ICD-10-CM | POA: Diagnosis not present

## 2015-09-14 DIAGNOSIS — L03116 Cellulitis of left lower limb: Secondary | ICD-10-CM | POA: Diagnosis not present

## 2015-09-17 DIAGNOSIS — L0390 Cellulitis, unspecified: Secondary | ICD-10-CM | POA: Diagnosis not present

## 2015-09-17 DIAGNOSIS — Z23 Encounter for immunization: Secondary | ICD-10-CM | POA: Diagnosis not present

## 2015-09-17 DIAGNOSIS — M25562 Pain in left knee: Secondary | ICD-10-CM | POA: Diagnosis not present

## 2015-10-08 IMAGING — CR RIGHT ANKLE - COMPLETE 3+ VIEW
1 series · 3 of 3 positions shown · non-contrast
Comparison: None.

CLINICAL DATA: Right ankle pain and swelling after fall.

EXAM:
RIGHT ANKLE - COMPLETE 3+ VIEW

[Series 1: ap · 0.17mm/px · 3 of 3 slices shown]
[im 1/3]
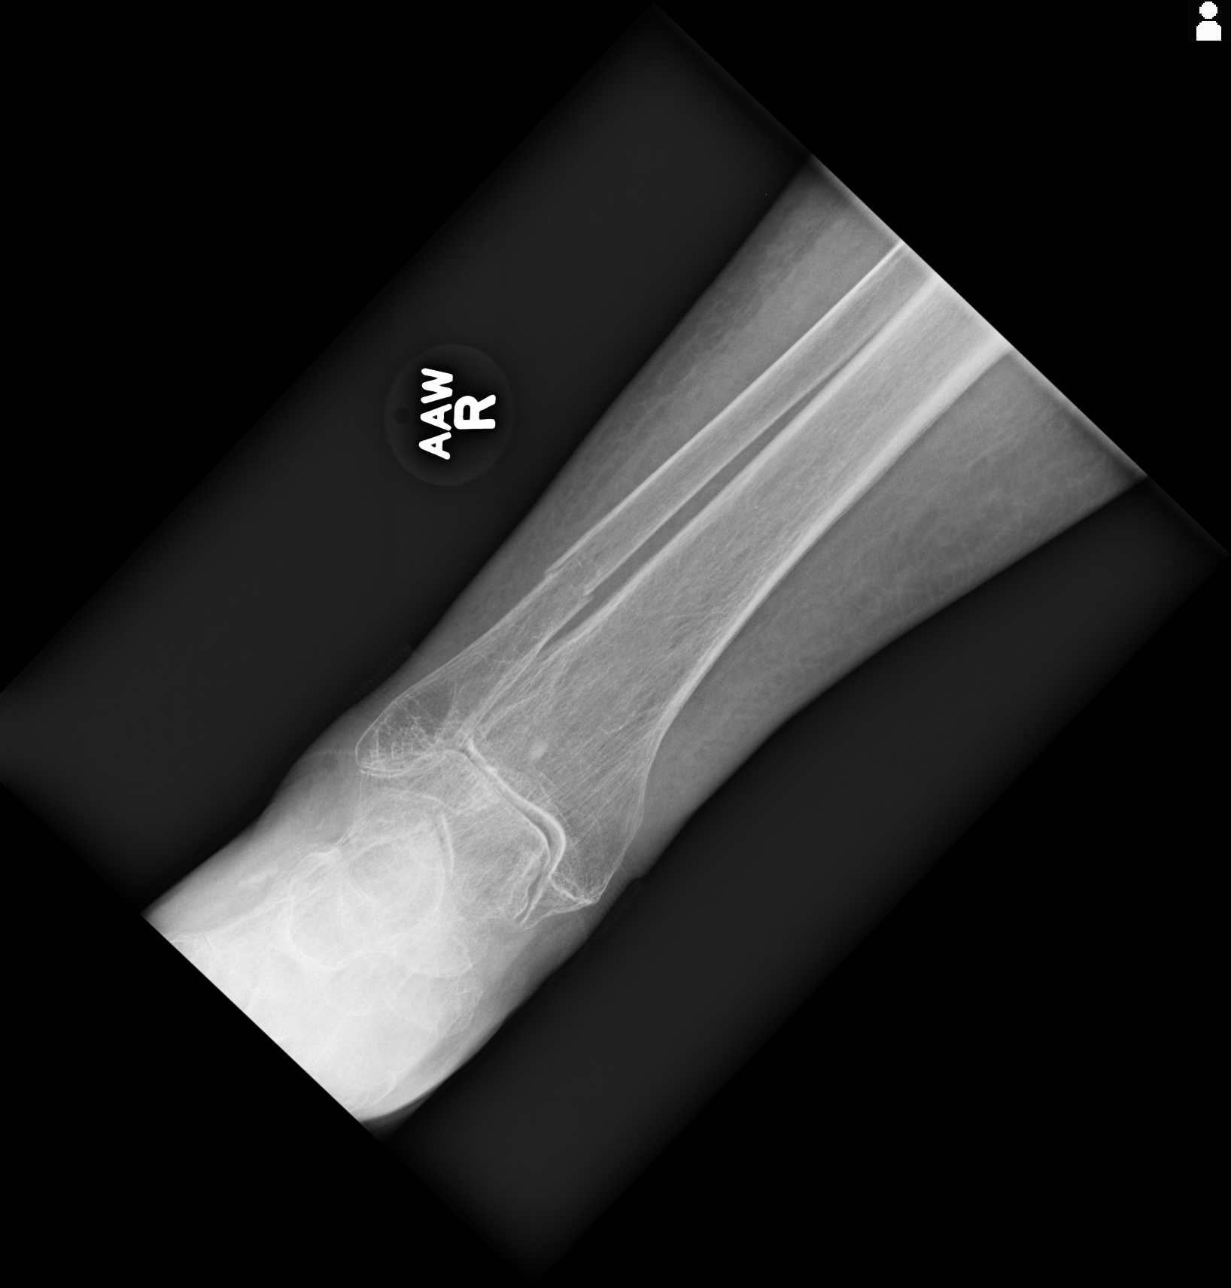
[im 2/3]
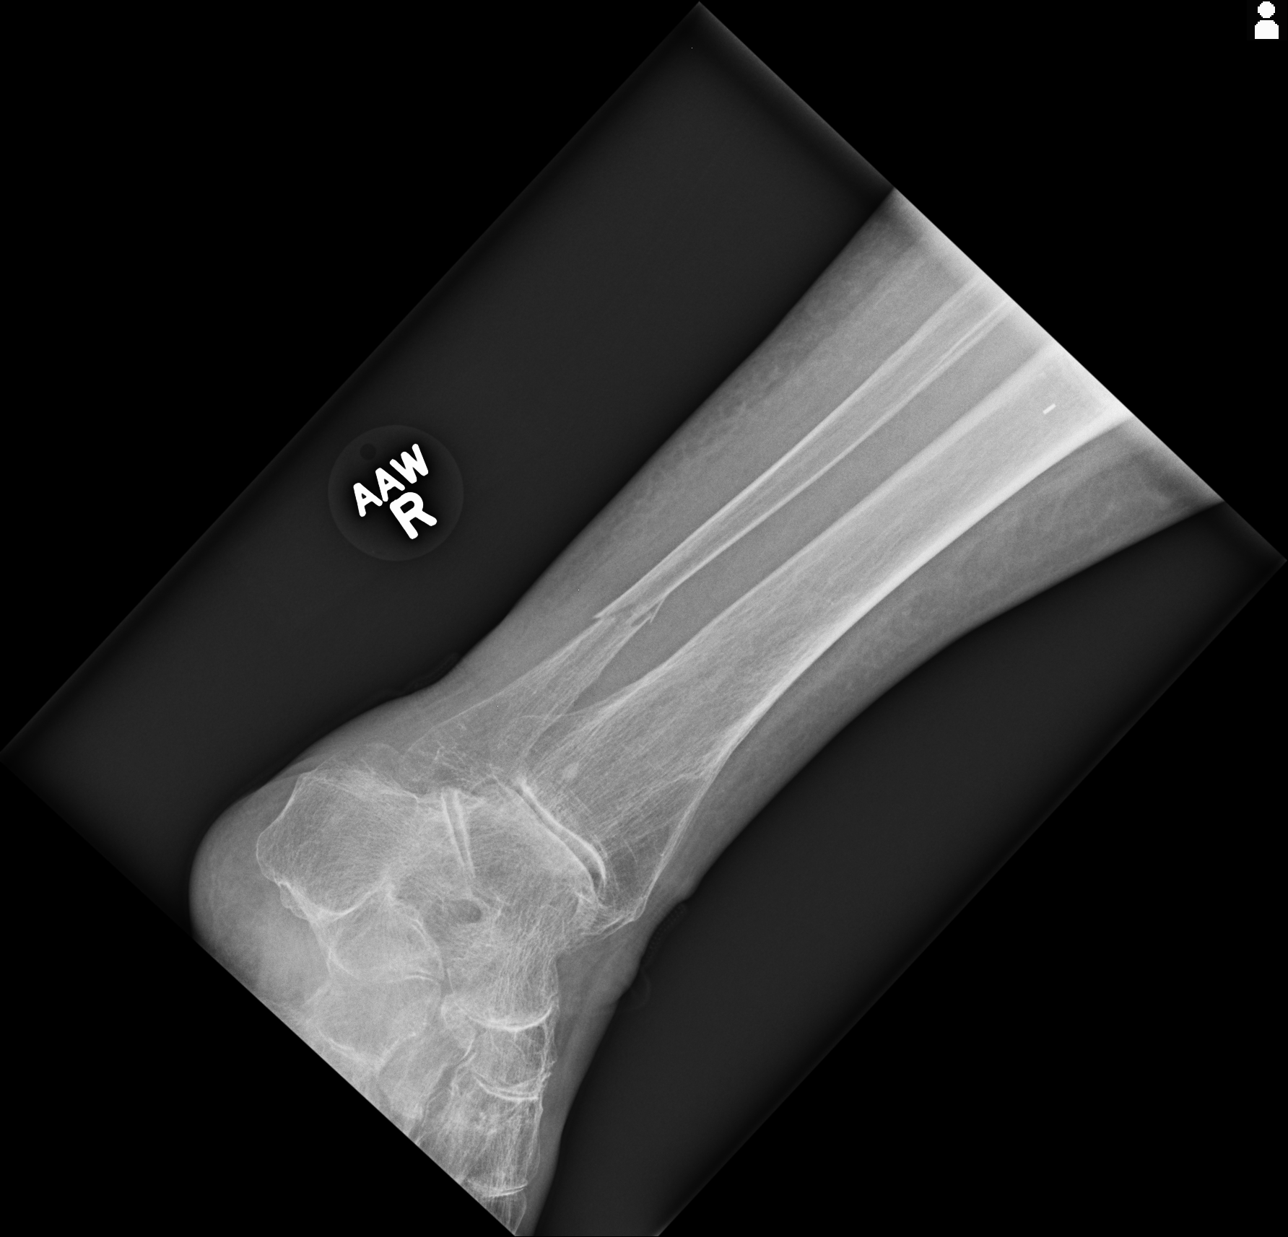
[im 3/3]
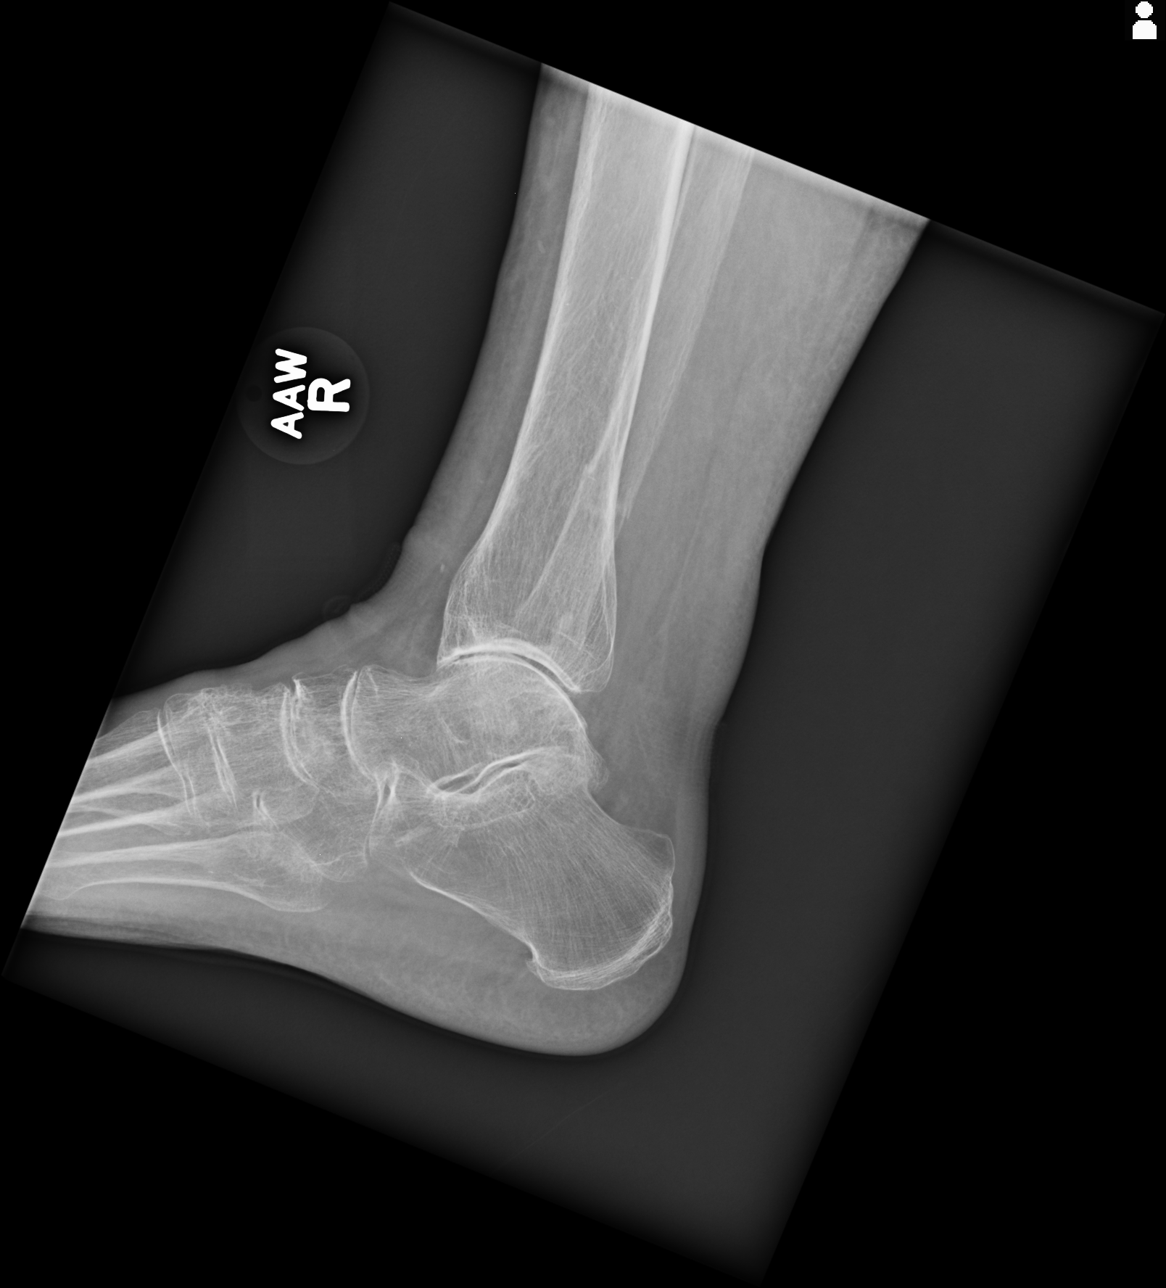

[3 of 3 positions shown; findings below may reference images not displayed]

FINDINGS: Minimally displaced fracture involving the distal right fibula is
noted. Osteopenia is noted. Mild degenerative changes seen involving
the talotibial joint. No soft tissue abnormality is noted.
IMPRESSION: Minimally displaced distal right fibular fracture.

## 2015-10-12 DIAGNOSIS — R609 Edema, unspecified: Secondary | ICD-10-CM | POA: Diagnosis not present

## 2015-10-21 DIAGNOSIS — Z4781 Encounter for orthopedic aftercare following surgical amputation: Secondary | ICD-10-CM | POA: Diagnosis not present

## 2015-10-21 DIAGNOSIS — Z89611 Acquired absence of right leg above knee: Secondary | ICD-10-CM | POA: Diagnosis not present

## 2015-10-29 DIAGNOSIS — E785 Hyperlipidemia, unspecified: Secondary | ICD-10-CM | POA: Diagnosis not present

## 2015-10-29 DIAGNOSIS — N390 Urinary tract infection, site not specified: Secondary | ICD-10-CM | POA: Diagnosis not present

## 2015-10-29 DIAGNOSIS — M810 Age-related osteoporosis without current pathological fracture: Secondary | ICD-10-CM | POA: Diagnosis not present

## 2015-10-29 DIAGNOSIS — I10 Essential (primary) hypertension: Secondary | ICD-10-CM | POA: Diagnosis not present

## 2015-11-03 DIAGNOSIS — I10 Essential (primary) hypertension: Secondary | ICD-10-CM | POA: Diagnosis not present

## 2015-11-03 DIAGNOSIS — R609 Edema, unspecified: Secondary | ICD-10-CM | POA: Diagnosis not present

## 2015-11-03 DIAGNOSIS — M810 Age-related osteoporosis without current pathological fracture: Secondary | ICD-10-CM | POA: Diagnosis not present

## 2015-12-05 DIAGNOSIS — Z950 Presence of cardiac pacemaker: Secondary | ICD-10-CM | POA: Diagnosis not present

## 2016-01-28 DIAGNOSIS — I10 Essential (primary) hypertension: Secondary | ICD-10-CM | POA: Diagnosis not present

## 2016-01-28 DIAGNOSIS — E559 Vitamin D deficiency, unspecified: Secondary | ICD-10-CM | POA: Diagnosis not present

## 2016-02-03 DIAGNOSIS — R609 Edema, unspecified: Secondary | ICD-10-CM | POA: Diagnosis not present

## 2016-02-03 DIAGNOSIS — Z1389 Encounter for screening for other disorder: Secondary | ICD-10-CM | POA: Diagnosis not present

## 2016-02-03 DIAGNOSIS — M25511 Pain in right shoulder: Secondary | ICD-10-CM | POA: Diagnosis not present

## 2016-02-03 DIAGNOSIS — I10 Essential (primary) hypertension: Secondary | ICD-10-CM | POA: Diagnosis not present

## 2016-02-17 DIAGNOSIS — Z89611 Acquired absence of right leg above knee: Secondary | ICD-10-CM | POA: Diagnosis not present

## 2016-02-17 DIAGNOSIS — Z4781 Encounter for orthopedic aftercare following surgical amputation: Secondary | ICD-10-CM | POA: Diagnosis not present

## 2016-02-29 DIAGNOSIS — Z89611 Acquired absence of right leg above knee: Secondary | ICD-10-CM | POA: Diagnosis not present

## 2016-02-29 DIAGNOSIS — I482 Chronic atrial fibrillation: Secondary | ICD-10-CM | POA: Diagnosis not present

## 2016-02-29 DIAGNOSIS — Z950 Presence of cardiac pacemaker: Secondary | ICD-10-CM | POA: Diagnosis not present

## 2016-02-29 DIAGNOSIS — Z8679 Personal history of other diseases of the circulatory system: Secondary | ICD-10-CM | POA: Diagnosis not present

## 2016-03-13 IMAGING — XA SP FLUORO GUIDE CV LINE*L*
1 series · 2 of 2 positions shown · non-contrast
Comparison: none

CLINICAL DATA: Occluded left arm PICC

[Series 1: run · 2 of 2 slices shown]
[im 1/2]
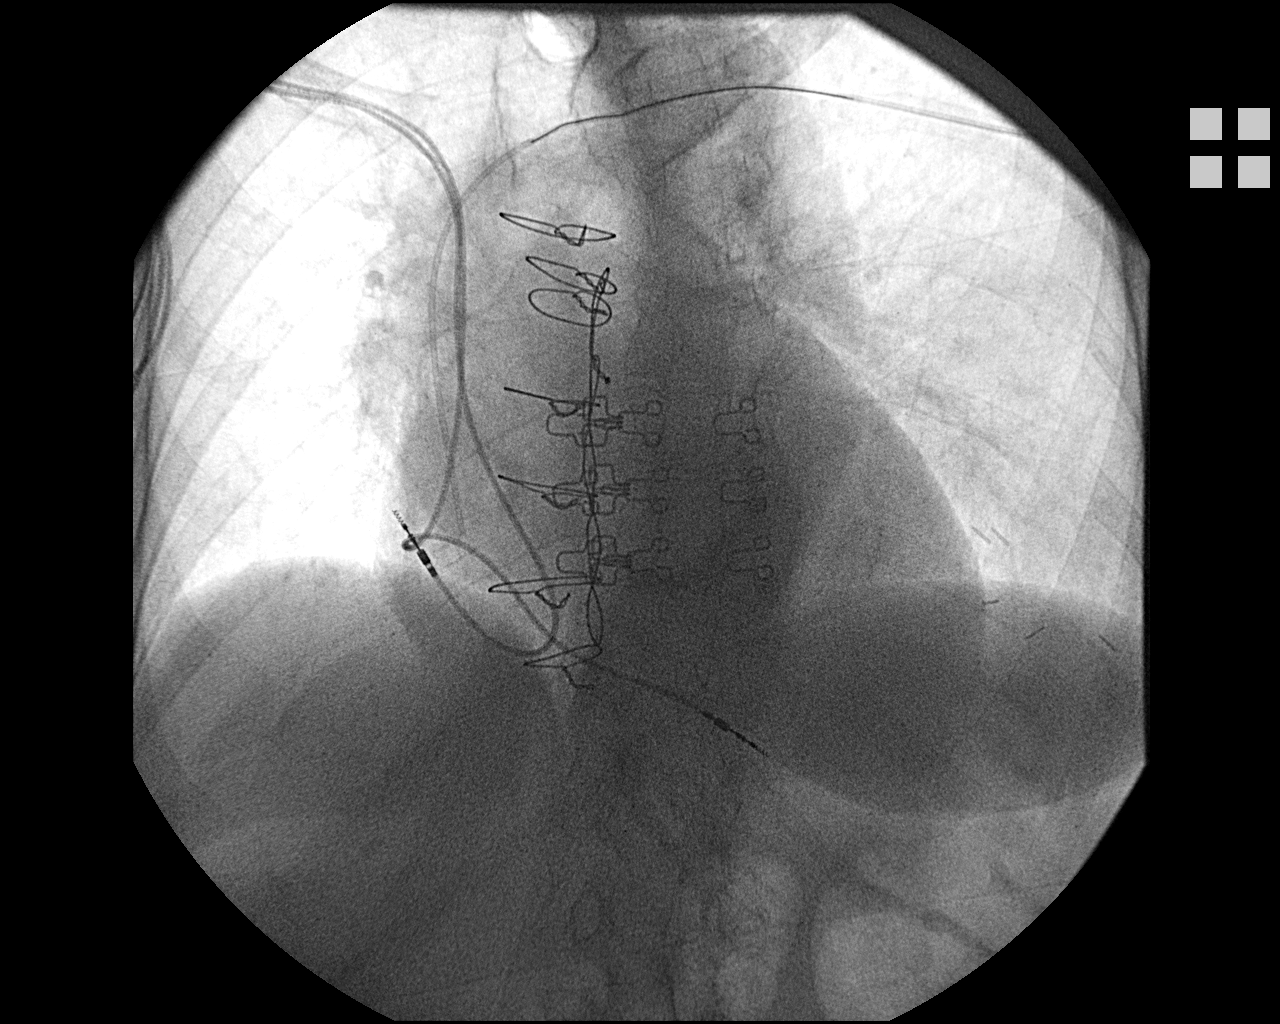
[im 2/2]
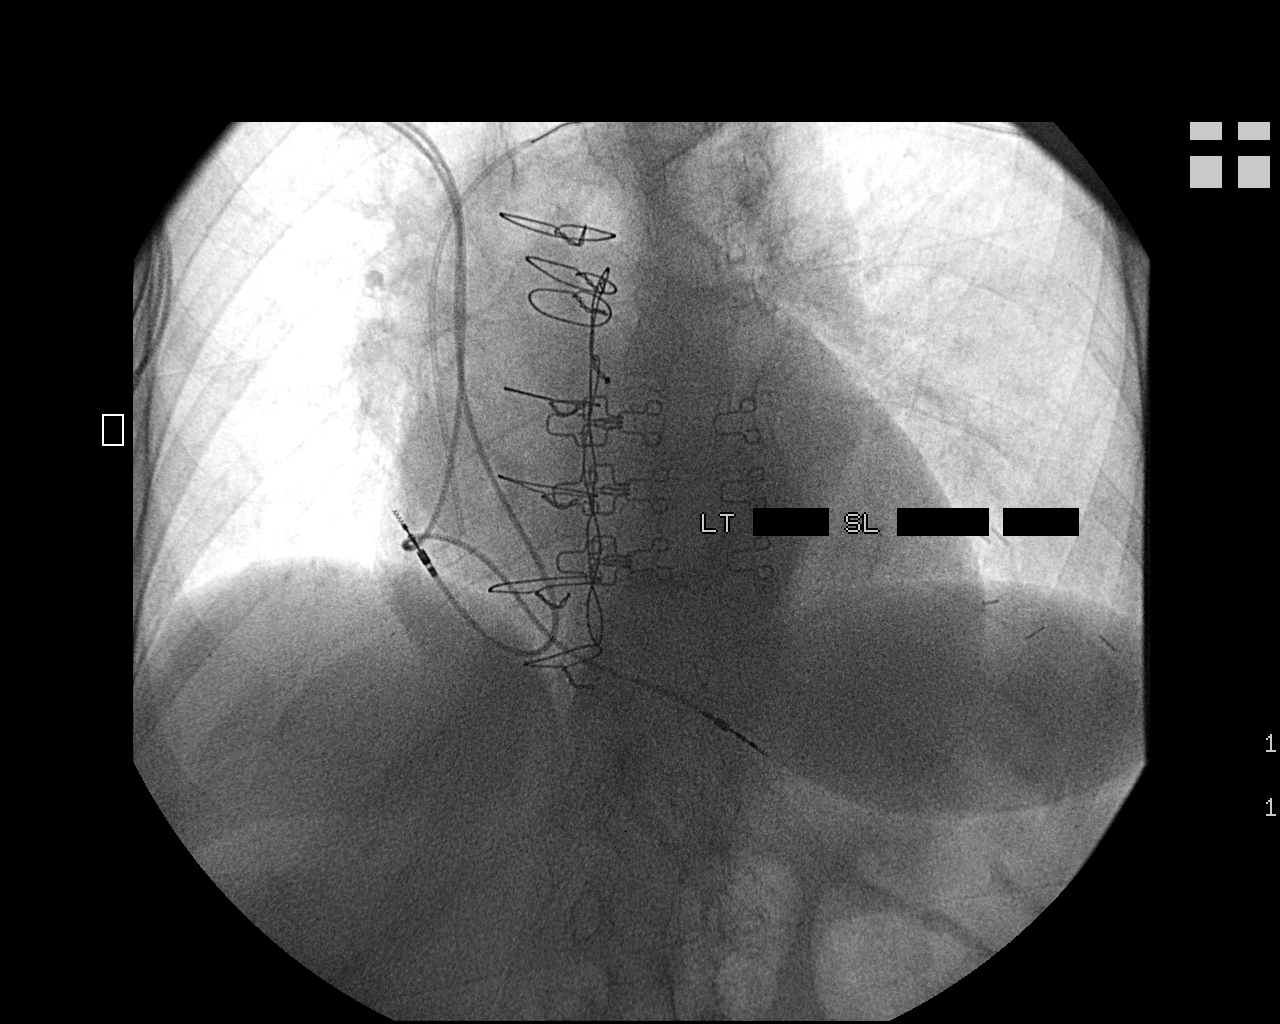

[2 of 2 positions shown; findings below may reference images not displayed]

EXAM:
IR LEFT FLOURO GUIDE CV LINE

FLUOROSCOPY TIME:  6 seconds.

MEDICATIONS AND MEDICAL HISTORY:
None

ANESTHESIA/SEDATION:
None

CONTRAST:  None

PROCEDURE:
The procedure, risks, benefits, and alternatives were explained to
the patient. Questions regarding the procedure were encouraged and
answered. The patient understands and consents to the procedure.

The left arm was prepped with Betadine in a sterile fashion, and a
sterile drape was applied covering the operative field. A sterile
gown and sterile gloves were used for the procedure.

The existing PICC was cut and exchanged over a 018 wire for a new 5
French single-lumen PICC. It was flushed and secured in place with a
StatLock.
FINDINGS: Tip of the PICC is at the cavoatrial junction.

COMPLICATIONS:
None
IMPRESSION: Successful left PICC exchange.

## 2016-03-15 DIAGNOSIS — H40013 Open angle with borderline findings, low risk, bilateral: Secondary | ICD-10-CM | POA: Diagnosis not present

## 2016-03-15 DIAGNOSIS — H35372 Puckering of macula, left eye: Secondary | ICD-10-CM | POA: Diagnosis not present

## 2016-03-15 DIAGNOSIS — H43813 Vitreous degeneration, bilateral: Secondary | ICD-10-CM | POA: Diagnosis not present

## 2016-03-15 DIAGNOSIS — H353130 Nonexudative age-related macular degeneration, bilateral, stage unspecified: Secondary | ICD-10-CM | POA: Diagnosis not present

## 2016-04-04 DIAGNOSIS — H35032 Hypertensive retinopathy, left eye: Secondary | ICD-10-CM | POA: Diagnosis not present

## 2016-04-04 DIAGNOSIS — H26491 Other secondary cataract, right eye: Secondary | ICD-10-CM | POA: Diagnosis not present

## 2016-04-04 DIAGNOSIS — H35031 Hypertensive retinopathy, right eye: Secondary | ICD-10-CM | POA: Diagnosis not present

## 2016-04-04 DIAGNOSIS — H26492 Other secondary cataract, left eye: Secondary | ICD-10-CM | POA: Diagnosis not present

## 2016-05-31 ENCOUNTER — Emergency Department (HOSPITAL_COMMUNITY)
Admission: EM | Admit: 2016-05-31 | Discharge: 2016-05-31 | Disposition: A | Discharge status: Home / Self Care | Payer: Medicare Other | Attending: Emergency Medicine | Admitting: Emergency Medicine

## 2016-05-31 ENCOUNTER — Encounter (HOSPITAL_COMMUNITY): Payer: Self-pay

## 2016-05-31 ENCOUNTER — Emergency Department (HOSPITAL_COMMUNITY): Payer: Medicare Other

## 2016-05-31 DIAGNOSIS — Y929 Unspecified place or not applicable: Secondary | ICD-10-CM | POA: Insufficient documentation

## 2016-05-31 DIAGNOSIS — Z950 Presence of cardiac pacemaker: Secondary | ICD-10-CM | POA: Diagnosis not present

## 2016-05-31 DIAGNOSIS — S42411A Displaced simple supracondylar fracture without intercondylar fracture of right humerus, initial encounter for closed fracture: Secondary | ICD-10-CM | POA: Diagnosis not present

## 2016-05-31 DIAGNOSIS — J45909 Unspecified asthma, uncomplicated: Secondary | ICD-10-CM | POA: Diagnosis not present

## 2016-05-31 DIAGNOSIS — W0110XA Fall on same level from slipping, tripping and stumbling with subsequent striking against unspecified object, initial encounter: Secondary | ICD-10-CM | POA: Insufficient documentation

## 2016-05-31 DIAGNOSIS — Y9301 Activity, walking, marching and hiking: Secondary | ICD-10-CM | POA: Diagnosis not present

## 2016-05-31 DIAGNOSIS — S42391A Other fracture of shaft of right humerus, initial encounter for closed fracture: Secondary | ICD-10-CM | POA: Diagnosis not present

## 2016-05-31 DIAGNOSIS — Z7901 Long term (current) use of anticoagulants: Secondary | ICD-10-CM | POA: Diagnosis not present

## 2016-05-31 DIAGNOSIS — I10 Essential (primary) hypertension: Secondary | ICD-10-CM | POA: Diagnosis not present

## 2016-05-31 DIAGNOSIS — Y999 Unspecified external cause status: Secondary | ICD-10-CM | POA: Insufficient documentation

## 2016-05-31 DIAGNOSIS — S59901A Unspecified injury of right elbow, initial encounter: Secondary | ICD-10-CM | POA: Diagnosis present

## 2016-05-31 DIAGNOSIS — Z96653 Presence of artificial knee joint, bilateral: Secondary | ICD-10-CM | POA: Diagnosis not present

## 2016-05-31 DIAGNOSIS — S42414A Nondisplaced simple supracondylar fracture without intercondylar fracture of right humerus, initial encounter for closed fracture: Secondary | ICD-10-CM | POA: Diagnosis not present

## 2016-05-31 MED ORDER — OXYCODONE-ACETAMINOPHEN 5-325 MG PO TABS: 1.0000 | ORAL_TABLET | ORAL | Status: DC | PRN

## 2016-05-31 MED ORDER — OXYCODONE-ACETAMINOPHEN 5-325 MG PO TABS
2.0000 | ORAL_TABLET | Freq: Once | ORAL | Status: AC
  Administered 2016-05-31: 2 via ORAL
  Filled 2016-05-31: qty 2

## 2016-05-31 NOTE — Progress Notes (Signed)
Orthopedic Tech Progress Note Patient Details:  [name] 1931/05/24 [id number] Applied fiberglass coaptation splint to RUE.  Pulses, sensation, motion intact before and after splinting.  Capillary refill less than 2 seconds before and after splinting.  Placed splinted RUE in arm sling. Ortho Devices Type of Ortho Device: Coapt, Arm sling Ortho Device/Splint Location: RUE Ortho Device/Splint Interventions: Application   Darrol Poke 05/31/2016, 2:12 PM

## 2016-05-31 NOTE — ED Notes (Signed)
Patient complains of right humerus pain after falling while answering door today, no loc. Also has skin tear to left elbow.

## 2016-05-31 NOTE — Discharge Instructions (Signed)
°Cast or Splint Care  ° ° °Casts and splints support injured limbs and keep bones from moving while they heal. It is important to care for your cast or splint at home.  °HOME CARE INSTRUCTIONS  °Keep the cast or splint uncovered during the drying period. It can take 24 to 48 hours to dry if it is made of plaster. A fiberglass cast will dry in less than 1 hour.  °Do not rest the cast on anything harder than a pillow for the first 24 hours.  °Do not put weight on your injured limb or apply pressure to the cast until your health care provider gives you permission.  °Keep the cast or splint dry. Wet casts or splints can lose their shape and may not support the limb as well. A wet cast that has lost its shape can also create harmful pressure on your skin when it dries. Also, wet skin can become infected.  °Cover the cast or splint with a plastic bag when bathing or when out in the rain or snow. If the cast is on the trunk of the body, take sponge baths until the cast is removed.  °If your cast does become wet, dry it with a towel or a blow dryer on the cool setting only. °Keep your cast or splint clean. Soiled casts may be wiped with a moistened cloth.  °Do not place any hard or soft foreign objects under your cast or splint, such as cotton, toilet paper, lotion, or powder.  °Do not try to scratch the skin under the cast with any object. The object could get stuck inside the cast. Also, scratching could lead to an infection. If itching is a problem, use a blow dryer on a cool setting to relieve discomfort.  °Do not trim or cut your cast or remove padding from inside of it.  °Exercise all joints next to the injury that are not immobilized by the cast or splint. For example, if you have a long leg cast, exercise the hip joint and toes. If you have an arm cast or splint, exercise the shoulder, elbow, thumb, and fingers.  °Elevate your injured arm or leg on 1 or 2 pillows for the first 1 to 3 days to decrease swelling and  pain. It is best if you can comfortably elevate your cast so it is higher than your heart. °SEEK MEDICAL CARE IF:  °Your cast or splint cracks.  °Your cast or splint is too tight or too loose.  °You have unbearable itching inside the cast.  °Your cast becomes wet or develops a soft spot or area.  °You have a bad smell coming from inside your cast.  °You get an object stuck under your cast.  °Your skin around the cast becomes red or raw.  °You have new pain or worsening pain after the cast has been applied. °SEEK IMMEDIATE MEDICAL CARE IF:  °You have fluid leaking through the cast.  °You are unable to move your fingers or toes.  °You have discolored (blue or white), cool, painful, or very swollen fingers or toes beyond the cast.  °You have tingling or numbness around the injured area.  °You have severe pain or pressure under the cast.  °You have any difficulty with your breathing or have shortness of breath.  °You have chest pain. °This information is not intended to replace advice given to you by your health care provider. Make sure you discuss any questions you have with your health care provider.  °  Document Released: 12/02/2000 Document Revised: 09/25/2013 Document Reviewed: 06/13/2013  °Elsevier Interactive Patient Education ©2016 [employer]  ° °

## 2016-05-31 NOTE — ED Provider Notes (Signed)
CSN: [id number]     Arrival date & time 05/31/16  1209 History  By signing my name below, I, [name], attest that this documentation has been prepared under the direction and in the presence of [name], MD. Electronically Signed: Randa Jackson, ED Scribe. 05/31/2016. 1:02 PM.      Chief Complaint  Patient presents with  . Fall   The history is provided by the patient. No language interpreter was used.   HPI Comments: [name] is a 82 y.o. female who presents to the Emergency Department complaining of fall onset today PTA. Pt states that she was walking with her walked slipped and fell landing on her right side. Pt is complaining of right arm pain. Pt presents with skin tear to the left elbow. She states that the pain is worse with movement. Pt does report hx of right shoulder replacement in 2008. Denies numbness tingling, neck pain back pain or HA. Pt denies head injury or LOC.   Past Medical History  Diagnosis Date  . Mitral regurgitation   . Hypertrophic obstructive cardiomyopathy     Required septal myotomy  . Urinary incontinence   . Cystitis   . Hemorrhoids   . Bronchitis   . Asthma   . Anemia   . Heart block     Complete heart block post surgical requiring pacer  . Hypokalemia     Postoperative, improved  . Hyponatremia     Mild postoperative, improved  . Blood transfusion   . Constipation   . Arthritis pain   . Cancer [facility])     lt breast  . Hypertension     Benign Essential Hypertension    . Current use of long term anticoagulation   . Chronic atrial fibrillation ([place])   . Arthritis   . Pacemaker   . Memory difficulty    Past Surgical History  Procedure Laterality Date  . Anterior cervical diskectomy and fusion      C-7  . Septal myotomy    . Pacemaker placement  2006/11-27-2013    MDT dual chamber pacemaker implanted 2006 with gen change by Dr Lovena Le 11-27-2013  . Replacement total knee bilateral    . Femur fracture surgery      Right   . Wrist fracture surgery      Right  . Shoulder surgery      Right  . Breast biopsy      Left, negative  . Colonoscopy    . Abdominal hysterectomy    . Cardiomyopathy    . Breast lumpectomy  2012    left breast  . Joint replacement    . Permanent pacemaker generator change N/A 11/27/2013    Procedure: PERMANENT PACEMAKER GENERATOR CHANGE;  Surgeon: Evans Lance, MD;  Location: [facility] CATH LAB;  Service: Cardiovascular;  Laterality: N/A;  . Below knee leg amputation Right    Family History  Problem Relation Age of Onset  . Heart disease Mother   . Hypertension Mother   . Pancreatic cancer Mother   . Cancer Mother     pancreatic  . Pancreatic cancer Father   . Cancer Father     pancreatic  . Diabetes Brother   . Pancreatic cancer Other   . Heart disease Brother     Enlarged heart   Social History  Substance Use Topics  . Smoking status: Never Smoker   . Smokeless tobacco: None  . Alcohol Use: No   OB History    No  data available      Review of Systems  Musculoskeletal: Positive for arthralgias. Negative for back pain and neck pain.  Skin: Positive for wound.  Neurological: Negative for syncope, numbness and headaches.  All other systems reviewed and are negative.    Allergies  Ceftaroline; Codeine; Morphine; and Sulfa antibiotics  Home Medications   Prior to Admission medications   Medication Sig Start Date End Date Taking? Authorizing Provider  acetaminophen (TYLENOL) 500 MG tablet Take 1,000 mg by mouth every 6 (six) hours as needed for moderate pain.     Historical Provider, MD  albuterol (PROAIR HFA) 108 (90 BASE) MCG/ACT inhaler Inhale 2 puffs into the lungs every 6 (six) hours as needed for wheezing. 06/05/14   Evans Lance, MD  alendronate (FOSAMAX) 70 MG tablet Take 70 mg by mouth every 7 (seven) days. Take with a full glass of water on an empty stomach. Takes on Thursday    Historical Provider, MD  apixaban (ELIQUIS) 2.5 MG TABS tablet Take 1 tablet  (2.5 mg total) by mouth 2 (two) times daily. 10/01/14   Lavon Paganini Angiulli, PA-C  aspirin 325 MG tablet Take 325 mg by mouth every 6 (six) hours as needed for mild pain.    Historical Provider, MD  calcium citrate-vitamin D (CITRACAL+D) 315-200 MG-UNIT per tablet Take 1 tablet by mouth 2 (two) times daily. 10/01/14   Lavon Paganini Angiulli, PA-C  cyclobenzaprine (FLEXERIL) 5 MG tablet Take 1 tablet (5 mg total) by mouth daily. Patient taking differently: Take 5 mg by mouth daily as needed for muscle spasms.  10/01/14   Lavon Paganini Angiulli, PA-C  cycloSPORINE (RESTASIS) 0.05 % ophthalmic emulsion Place 1 drop into both eyes 2 (two) times daily. 10/01/14   Lavon Paganini Angiulli, PA-C  diltiazem (CARDIZEM CD) 180 MG 24 hr capsule Take 1 capsule (180 mg total) by mouth daily. 05/18/15   Adrian Prows, MD  ELIQUIS 2.5 MG TABS tablet TAKE 1 TABLET TWICE A DAY (PLEASE CALL AND SCHEDULE AN APPOINTMENT FOR JUNE) 09/03/15   Evans Lance, MD  fluticasone [facility]) 50 MCG/ACT nasal spray Place 2 sprays into both nostrils 2 (two) times daily as needed for allergies.     Historical Provider, MD  furosemide (LASIX) 20 MG tablet Take 1 tablet (20 mg total) by mouth 2 (two) times daily. 10/01/14   Lavon Paganini Angiulli, PA-C  gabapentin (NEURONTIN) 300 MG capsule Take 1 capsule (300 mg total) by mouth 3 (three) times daily. 10/01/14   Lavon Paganini Angiulli, PA-C  metoprolol succinate (TOPROL-XL) 25 MG 24 hr tablet Take 1 tablet (25 mg total) by mouth daily. 10/01/14   Lavon Paganini Angiulli, PA-C  mirtazapine (REMERON) 15 MG tablet Take 1 tablet (15 mg total) by mouth at bedtime. 10/01/14   Lavon Paganini Angiulli, PA-C  Multiple Vitamin (MULTIVITAMIN) tablet Take 1 tablet by mouth 2 (two) times daily.     Historical Provider, MD  oxyCODONE 10 MG TABS Take 1 tablet (10 mg total) by mouth every 4 (four) hours as needed for severe pain. 10/01/14   Lavon Paganini Angiulli, PA-C  potassium chloride SA (K-DUR,KLOR-CON) 20 MEQ tablet Take 1 tablet (20 mEq total)  by mouth 2 (two) times daily. 10/01/14   Lavon Paganini Angiulli, PA-C  traMADol (ULTRAM) 50 MG tablet Take 50 mg by mouth every 6 (six) hours as needed for moderate pain.     Historical Provider, MD   BP 132/78 mmHg  Pulse 68  Temp(Src) 98.5 F (36.9 C) (  Oral)  Resp 18  SpO2 98%   Physical Exam  Constitutional: She is oriented to person, place, and time. She appears well-developed and well-nourished. No distress.  HENT:  Head: Normocephalic and atraumatic.  Eyes: EOM are normal.  Neck: Normal range of motion.  Cardiovascular: Normal rate, regular rhythm and normal heart sounds.   Pulmonary/Chest: Effort normal and breath sounds normal.  Abdominal: Soft. She exhibits no distension. There is no tenderness.  Musculoskeletal: Normal range of motion. She exhibits tenderness.  Deformity and crepitus noted at [facility], No significant tenderness.  Tenderness mid right humerus, no swelling or deformity, NVI distally.   Neurological: She is alert and oriented to person, place, and time.  Skin: Skin is warm and dry.  Psychiatric: She has a normal mood and affect. Judgment normal.  Nursing note and vitals reviewed.   ED Course  Procedures (including critical care time) DIAGNOSTIC STUDIES: Oxygen Saturation is 98% on RA, normal by my interpretation.    COORDINATION OF CARE: 1:02 PM-Discussed treatment plan with pt at bedside and pt agreed to plan.     Labs Review Labs Reviewed - No data to display  Imaging Review Dg Humerus Right  05/31/2016  CLINICAL DATA:  Pt fell today onto right arm; c/o right upper arm pain with no range of motion; hx surgery to right arm pt doesn't recall when EXAM: RIGHT HUMERUS - 2+ VIEW COMPARISON:  03/28/2007 FINDINGS: There is an acute spiral, nondisplaced, non comminuted and nonangulated fracture of the left humeral shaft extending from the midportion, adjacent to the distal margin of the humeral stem of the right shoulder prosthesis, distally to the distal  diaphysis. No other fractures. The [facility] joint is separated with the distal clavicle lying above the acromion, new from the prior exam. There is no associated soft tissue swelling. This is likely chronic. The shoulder prosthesis appears well seated and aligned. The elbow joint is normally aligned. The bones are diffusely demineralized. IMPRESSION: 1. Acute, nondisplaced, nonangulated and non comminuted fracture of the right humeral diaphysis. Electronically Signed   By: Lajean Manes M.D.   On: 05/31/2016 12:36   I have personally reviewed and evaluated these images as part of my medical decision-making.   EKG Interpretation None      MDM   Final diagnoses:  Right supracondylar humerus fracture, closed, initial encounter    82 year old female with arm pain after mechanical fall. Closed injury. Neurovascularly intact. Briefly discussed with orthopedics. Plan is for sling and outpatient follow-up. As needed pain medication. Return precautions discussed.  I personally preformed the services scribed in my presence. The recorded information has been reviewed is accurate. [name], MD.      [name], MD 06/15/16 1240

## 2016-06-01 DIAGNOSIS — S42301A Unspecified fracture of shaft of humerus, right arm, initial encounter for closed fracture: Secondary | ICD-10-CM | POA: Diagnosis not present

## 2016-06-07 DIAGNOSIS — S42301D Unspecified fracture of shaft of humerus, right arm, subsequent encounter for fracture with routine healing: Secondary | ICD-10-CM | POA: Diagnosis not present

## 2016-06-08 ENCOUNTER — Encounter (HOSPITAL_COMMUNITY): Payer: Self-pay | Admitting: *Deleted

## 2016-06-08 ENCOUNTER — Emergency Department (HOSPITAL_COMMUNITY): Payer: Medicare Other

## 2016-06-08 ENCOUNTER — Other Ambulatory Visit: Payer: Self-pay

## 2016-06-08 ENCOUNTER — Inpatient Hospital Stay (HOSPITAL_COMMUNITY)
Admission: EM | Admit: 2016-06-08 | Discharge: 2016-06-13 | DRG: 291 | Disposition: A | Discharge status: Skilled Nursing Facility | Payer: Medicare Other | Attending: Internal Medicine | Admitting: Internal Medicine

## 2016-06-08 DIAGNOSIS — Z79899 Other long term (current) drug therapy: Secondary | ICD-10-CM

## 2016-06-08 DIAGNOSIS — I5033 Acute on chronic diastolic (congestive) heart failure: Secondary | ICD-10-CM

## 2016-06-08 DIAGNOSIS — I110 Hypertensive heart disease with heart failure: Secondary | ICD-10-CM | POA: Diagnosis not present

## 2016-06-08 DIAGNOSIS — J45909 Unspecified asthma, uncomplicated: Secondary | ICD-10-CM | POA: Diagnosis present

## 2016-06-08 DIAGNOSIS — Z882 Allergy status to sulfonamides status: Secondary | ICD-10-CM

## 2016-06-08 DIAGNOSIS — I509 Heart failure, unspecified: Secondary | ICD-10-CM

## 2016-06-08 DIAGNOSIS — E440 Moderate protein-calorie malnutrition: Secondary | ICD-10-CM | POA: Diagnosis present

## 2016-06-08 DIAGNOSIS — R0602 Shortness of breath: Secondary | ICD-10-CM | POA: Diagnosis not present

## 2016-06-08 DIAGNOSIS — R05 Cough: Secondary | ICD-10-CM | POA: Diagnosis not present

## 2016-06-08 DIAGNOSIS — S42301G Unspecified fracture of shaft of humerus, right arm, subsequent encounter for fracture with delayed healing: Secondary | ICD-10-CM

## 2016-06-08 DIAGNOSIS — S42391A Other fracture of shaft of right humerus, initial encounter for closed fracture: Secondary | ICD-10-CM | POA: Diagnosis not present

## 2016-06-08 DIAGNOSIS — Z950 Presence of cardiac pacemaker: Secondary | ICD-10-CM

## 2016-06-08 DIAGNOSIS — Z7901 Long term (current) use of anticoagulants: Secondary | ICD-10-CM

## 2016-06-08 DIAGNOSIS — I248 Other forms of acute ischemic heart disease: Secondary | ICD-10-CM | POA: Diagnosis present

## 2016-06-08 DIAGNOSIS — Z96653 Presence of artificial knee joint, bilateral: Secondary | ICD-10-CM | POA: Diagnosis present

## 2016-06-08 DIAGNOSIS — J9600 Acute respiratory failure, unspecified whether with hypoxia or hypercapnia: Secondary | ICD-10-CM

## 2016-06-08 DIAGNOSIS — I4891 Unspecified atrial fibrillation: Secondary | ICD-10-CM | POA: Diagnosis present

## 2016-06-08 DIAGNOSIS — Z6822 Body mass index (BMI) 22.0-22.9, adult: Secondary | ICD-10-CM

## 2016-06-08 DIAGNOSIS — Z800 Family history of malignant neoplasm of digestive organs: Secondary | ICD-10-CM

## 2016-06-08 DIAGNOSIS — J9601 Acute respiratory failure with hypoxia: Secondary | ICD-10-CM | POA: Diagnosis not present

## 2016-06-08 DIAGNOSIS — Z89511 Acquired absence of right leg below knee: Secondary | ICD-10-CM

## 2016-06-08 DIAGNOSIS — I482 Chronic atrial fibrillation: Secondary | ICD-10-CM | POA: Diagnosis present

## 2016-06-08 DIAGNOSIS — N179 Acute kidney failure, unspecified: Secondary | ICD-10-CM | POA: Diagnosis present

## 2016-06-08 DIAGNOSIS — Z881 Allergy status to other antibiotic agents status: Secondary | ICD-10-CM

## 2016-06-08 DIAGNOSIS — S42309A Unspecified fracture of shaft of humerus, unspecified arm, initial encounter for closed fracture: Secondary | ICD-10-CM | POA: Diagnosis present

## 2016-06-08 DIAGNOSIS — I442 Atrioventricular block, complete: Secondary | ICD-10-CM | POA: Diagnosis present

## 2016-06-08 DIAGNOSIS — Z833 Family history of diabetes mellitus: Secondary | ICD-10-CM

## 2016-06-08 DIAGNOSIS — Z885 Allergy status to narcotic agent status: Secondary | ICD-10-CM

## 2016-06-08 DIAGNOSIS — M84321A Stress fracture, right humerus, initial encounter for fracture: Secondary | ICD-10-CM | POA: Diagnosis not present

## 2016-06-08 DIAGNOSIS — I10 Essential (primary) hypertension: Secondary | ICD-10-CM

## 2016-06-08 DIAGNOSIS — Z8249 Family history of ischemic heart disease and other diseases of the circulatory system: Secondary | ICD-10-CM

## 2016-06-08 DIAGNOSIS — Z7951 Long term (current) use of inhaled steroids: Secondary | ICD-10-CM

## 2016-06-08 DIAGNOSIS — I421 Obstructive hypertrophic cardiomyopathy: Secondary | ICD-10-CM | POA: Diagnosis present

## 2016-06-08 DIAGNOSIS — Z981 Arthrodesis status: Secondary | ICD-10-CM

## 2016-06-08 DIAGNOSIS — I340 Nonrheumatic mitral (valve) insufficiency: Secondary | ICD-10-CM | POA: Diagnosis present

## 2016-06-08 HISTORY — DX: Unspecified fracture of shaft of humerus, unspecified arm, initial encounter for closed fracture: S42.309A

## 2016-06-08 LAB — CBC WITH DIFFERENTIAL/PLATELET
Basophils Absolute: 0 10*3/uL (ref 0.0–0.1)
Basophils Relative: 0 %
Eosinophils Absolute: 0 10*3/uL (ref 0.0–0.7)
Eosinophils Relative: 0 %
HCT: 36.4 % (ref 36.0–46.0)
Hemoglobin: 12.2 g/dL (ref 12.0–15.0)
Lymphocytes Relative: 7 %
Lymphs Abs: 0.6 10*3/uL — ABNORMAL LOW (ref 0.7–4.0)
MCH: 33.2 pg (ref 26.0–34.0)
MCHC: 33.5 g/dL (ref 30.0–36.0)
MCV: 99.2 fL (ref 78.0–100.0)
Monocytes Absolute: 0.6 10*3/uL (ref 0.1–1.0)
Monocytes Relative: 8 %
Neutro Abs: 6.7 10*3/uL (ref 1.7–7.7)
Neutrophils Relative %: 85 %
Platelets: 163 10*3/uL (ref 150–400)
RBC: 3.67 MIL/uL — ABNORMAL LOW (ref 3.87–5.11)
RDW: 15.3 % (ref 11.5–15.5)
WBC: 7.9 10*3/uL (ref 4.0–10.5)

## 2016-06-08 LAB — COMPREHENSIVE METABOLIC PANEL
ALT: 26 U/L (ref 14–54)
AST: 47 U/L — ABNORMAL HIGH (ref 15–41)
Albumin: 4.1 g/dL (ref 3.5–5.0)
Alkaline Phosphatase: 77 U/L (ref 38–126)
Anion gap: 7 (ref 5–15)
BUN: 18 mg/dL (ref 6–20)
CO2: 24 mmol/L (ref 22–32)
Calcium: 9.2 mg/dL (ref 8.9–10.3)
Chloride: 107 mmol/L (ref 101–111)
Creatinine, Ser: 0.83 mg/dL (ref 0.44–1.00)
GFR calc Af Amer: >60 mL/min (ref >60)
GFR calc non Af Amer: >60 mL/min (ref >60)
Glucose, Bld: 97 mg/dL (ref 65–99)
Potassium: 4.4 mmol/L (ref 3.5–5.1)
Sodium: 138 mmol/L (ref 135–145)
Total Bilirubin: 1.7 mg/dL — ABNORMAL HIGH (ref 0.3–1.2)
Total Protein: 6.8 g/dL (ref 6.5–8.1)

## 2016-06-08 LAB — TROPONIN I: Troponin I: 0.06 ng/mL — ABNORMAL HIGH (ref <0.031)

## 2016-06-08 LAB — BRAIN NATRIURETIC PEPTIDE: B Natriuretic Peptide: 1255.7 pg/mL — ABNORMAL HIGH (ref 0.0–100.0)

## 2016-06-08 MED ORDER — FUROSEMIDE 10 MG/ML IJ SOLN
80.0000 mg | Freq: Once | INTRAMUSCULAR | Status: AC
  Administered 2016-06-09: 80 mg via INTRAVENOUS
  Filled 2016-06-08: qty 8

## 2016-06-08 MED ORDER — IPRATROPIUM-ALBUTEROL 0.5-2.5 (3) MG/3ML IN SOLN
3.0000 mL | Freq: Once | RESPIRATORY_TRACT | Status: AC
  Administered 2016-06-08: 3 mL via RESPIRATORY_TRACT
  Filled 2016-06-08: qty 3

## 2016-06-08 NOTE — ED Provider Notes (Signed)
CSN: [id number]     Arrival date & time 06/08/16  1945 History   First MD Initiated Contact with Patient 06/08/16 2021     Chief Complaint  Patient presents with  . Shortness of Breath     (Consider location/radiation/quality/duration/timing/severity/associated sxs/prior Treatment) Patient is a 82 y.o. female presenting with shortness of breath.  Shortness of Breath Severity:  Mild Onset quality:  Gradual Timing:  Constant Progression:  Worsening Chronicity:  Recurrent Relieved by:  None tried Worsened by:  Nothing tried Associated symptoms: cough   Associated symptoms: no abdominal pain, no chest pain, no fever, no headaches and no vomiting     Past Medical History  Diagnosis Date  . Mitral regurgitation   . Hypertrophic obstructive cardiomyopathy     Required septal myotomy  . Urinary incontinence   . Cystitis   . Hemorrhoids   . Bronchitis   . Asthma   . Anemia   . Heart block     Complete heart block post surgical requiring pacer  . Hypokalemia     Postoperative, improved  . Hyponatremia     Mild postoperative, improved  . Blood transfusion   . Constipation   . Arthritis pain   . Cancer [facility])     lt breast  . Hypertension     Benign Essential Hypertension    . Current use of long term anticoagulation   . Chronic atrial fibrillation ([place])   . Arthritis   . Pacemaker   . Memory difficulty    Past Surgical History  Procedure Laterality Date  . Anterior cervical diskectomy and fusion      C-7  . Septal myotomy    . Pacemaker placement  2006/11-27-2013    MDT dual chamber pacemaker implanted 2006 with gen change by Dr Lovena Le 11-27-2013  . Replacement total knee bilateral    . Femur fracture surgery      Right  . Wrist fracture surgery      Right  . Shoulder surgery      Right  . Breast biopsy      Left, negative  . Colonoscopy    . Abdominal hysterectomy    . Cardiomyopathy    . Breast lumpectomy  2012    left breast  . Joint replacement    .  Permanent pacemaker generator change N/A 11/27/2013    Procedure: PERMANENT PACEMAKER GENERATOR CHANGE;  Surgeon: Evans Lance, MD;  Location: [facility] CATH LAB;  Service: Cardiovascular;  Laterality: N/A;  . Below knee leg amputation Right    Family History  Problem Relation Age of Onset  . Heart disease Mother   . Hypertension Mother   . Pancreatic cancer Mother   . Cancer Mother     pancreatic  . Pancreatic cancer Father   . Cancer Father     pancreatic  . Diabetes Brother   . Pancreatic cancer Other   . Heart disease Brother     Enlarged heart   Social History  Substance Use Topics  . Smoking status: Never Smoker   . Smokeless tobacco: None  . Alcohol Use: No   OB History    No data available     Review of Systems  Constitutional: Negative for fever.  HENT: Negative for congestion and facial swelling.   Eyes: Negative for discharge and redness.  Respiratory: Positive for cough and shortness of breath.   Cardiovascular: Negative for chest pain.  Gastrointestinal: Negative for nausea, vomiting, abdominal pain and abdominal distention.  Endocrine:  Negative for polydipsia.  Genitourinary: Negative for dysuria.  Musculoskeletal: Negative for back pain.  Skin: Negative for wound.  Neurological: Negative for headaches.      Allergies  Ceftaroline; Codeine; Morphine; and Sulfa antibiotics  Home Medications   Prior to Admission medications   Medication Sig Start Date End Date Taking? Authorizing Provider  acetaminophen (TYLENOL) 500 MG tablet Take 1,000 mg by mouth every 6 (six) hours as needed for moderate pain.    Yes Historical Provider, MD  albuterol (PROAIR HFA) 108 (90 BASE) MCG/ACT inhaler Inhale 2 puffs into the lungs every 6 (six) hours as needed for wheezing. 06/05/14  Yes Evans Lance, MD  alendronate (FOSAMAX) 70 MG tablet Take 70 mg by mouth every 7 (seven) days. Take with a full glass of water on an empty stomach. Takes on Thursday   Yes Historical  Provider, MD  apixaban (ELIQUIS) 2.5 MG TABS tablet Take 1 tablet (2.5 mg total) by mouth 2 (two) times daily. 10/01/14  Yes Daniel J Angiulli, PA-C  aspirin 325 MG tablet Take 325 mg by mouth every 6 (six) hours as needed for mild pain.   Yes Historical Provider, MD  calcium citrate-vitamin D (CITRACAL+D) 315-200 MG-UNIT per tablet Take 1 tablet by mouth 2 (two) times daily. 10/01/14  Yes Daniel J Angiulli, PA-C  cyclobenzaprine (FLEXERIL) 5 MG tablet Take 1 tablet (5 mg total) by mouth daily. Patient taking differently: Take 5 mg by mouth daily as needed for muscle spasms.  10/01/14  Yes Daniel J Angiulli, PA-C  cycloSPORINE (RESTASIS) 0.05 % ophthalmic emulsion Place 1 drop into both eyes 2 (two) times daily. 10/01/14  Yes Daniel J Angiulli, PA-C  diltiazem (CARDIZEM CD) 180 MG 24 hr capsule Take 1 capsule (180 mg total) by mouth daily. 05/18/15  Yes Adrian Prows, MD  fluticasone (FLONASE) 50 MCG/ACT nasal spray Place 2 sprays into both nostrils 2 (two) times daily as needed for allergies.    Yes Historical Provider, MD  furosemide (LASIX) 20 MG tablet Take 1 tablet (20 mg total) by mouth 2 (two) times daily. 10/01/14  Yes Daniel J Angiulli, PA-C  mirtazapine (REMERON) 15 MG tablet Take 1 tablet (15 mg total) by mouth at bedtime. 10/01/14  Yes Daniel J Angiulli, PA-C  Multiple Vitamin (MULTIVITAMIN) tablet Take 1 tablet by mouth 2 (two) times daily.    Yes Historical Provider, MD  oxyCODONE 10 MG TABS Take 1 tablet (10 mg total) by mouth every 4 (four) hours as needed for severe pain. 10/01/14  Yes Daniel J Angiulli, PA-C  oxyCODONE-acetaminophen (PERCOCET/ROXICET) 5-325 MG tablet Take 1 tablet by mouth every 4 (four) hours as needed for severe pain. 05/31/16  Yes [name], MD  potassium chloride SA (K-DUR,KLOR-CON) 20 MEQ tablet Take 1 tablet (20 mEq total) by mouth 2 (two) times daily. 10/01/14  Yes Daniel J Angiulli, PA-C  traMADol (ULTRAM) 50 MG tablet Take 50 mg by mouth every 6 (six) hours as  needed for moderate pain.    Yes Historical Provider, MD  ELIQUIS 2.5 MG TABS tablet TAKE 1 TABLET TWICE A DAY (PLEASE CALL AND SCHEDULE AN APPOINTMENT FOR JUNE) Patient not taking: Reported on 06/08/2016 09/03/15   Evans Lance, MD  gabapentin (NEURONTIN) 300 MG capsule Take 1 capsule (300 mg total) by mouth 3 (three) times daily. Patient not taking: Reported on 06/08/2016 10/01/14   Lavon Paganini Angiulli, PA-C  metoprolol succinate (TOPROL-XL) 25 MG 24 hr tablet Take 1 tablet (25 mg total) by mouth daily.  Patient not taking: Reported on 06/08/2016 10/01/14   Lavon Paganini Angiulli, PA-C   BP 136/71 mmHg  Pulse 66  Temp(Src) 98 F (36.7 C) (Oral)  Resp 24  Ht 5\' 2"  (1.575 m)  Wt 129 lb (58.514 kg)  BMI 23.59 kg/m2  SpO2 93% Physical Exam  Constitutional: She appears well-developed and well-nourished.  HENT:  Head: Normocephalic and atraumatic.  Neck: Normal range of motion.  Cardiovascular: Normal rate and regular rhythm.   Pulmonary/Chest: No stridor. No respiratory distress. She has wheezes. She has rales.  Abdominal: She exhibits no distension.  Musculoskeletal: She exhibits edema (LLE).  Neurological: She is alert.  Nursing note and vitals reviewed.   ED Course  Procedures (including critical care time) Labs Review Labs Reviewed  CBC WITH DIFFERENTIAL/PLATELET - Abnormal; Notable for the following:    RBC 3.67 (*)    Lymphs Abs 0.6 (*)    All other components within normal limits  COMPREHENSIVE METABOLIC PANEL  TROPONIN I  BRAIN NATRIURETIC PEPTIDE    Imaging Review Dg Chest 2 View  06/08/2016  CLINICAL DATA:  Acute onset of shortness of breath, cough and wheezing. Initial encounter. EXAM: CHEST  2 VIEW COMPARISON:  Chest radiograph performed 05/17/2015 FINDINGS: The lungs are well-aerated. Mild right midlung opacity could reflect pneumonia, given the patient's symptoms. There is no evidence of pleural effusion or pneumothorax. The heart is mildly enlarged. Calcification is  noted at the mitral valve. The patient is status post median sternotomy. A pacemaker is noted at the right chest wall, with leads ending at the right atrium and right ventricle. No acute osseous abnormalities are seen. Cervical spinal fusion hardware is noted. The patient's right shoulder arthroplasty is grossly unremarkable in appearance, though incompletely imaged. IMPRESSION: 1. Mild right midlung opacity could reflect pneumonia, given the patient's symptoms. 2. Mild cardiomegaly.  Calcification at the mitral valve. Electronically Signed   By: Garald Balding M.D.   On: 06/08/2016 20:52   Dg Humerus Right  06/08/2016  CLINICAL DATA:  82 year old female with worsening right humeral fracture. EXAM: RIGHT HUMERUS - 2+ VIEW COMPARISON:  Right femur radiograph dated 05/31/2016 FINDINGS: There is a spiral fracture of the mid shaft of the right humerus starting adjacent to the distal aspect of the humeral stem of the prosthesis an extending to the distal diaphysis. There is no displacement, however there has been interval widening of the fracture gap compared to the prior study. The fracture distraction measures approximately 5 mm. No other fracture identified. The bones are osteopenic. The right shoulder prosthesis appears in similar positioning as the prior study. The bones are osteopenic. The soft tissues appear unremarkable. IMPRESSION: Interval increase in the distraction of the previously seen spiral fracture involving the mid to distal right humeral diaphysis. Electronically Signed   By: Anner Crete M.D.   On: 06/08/2016 21:30   I have personally reviewed and evaluated these images and lab results as part of my medical decision-making.   EKG Interpretation None      MDM   Final diagnoses:  Acute on chronic congestive heart failure, unspecified congestive heart failure type (HCC)  Acute respiratory failure with hypoxia [facility])    A 54-year-old female here with likely CHF exacerbation. Has not  taken her Lasix because of her broken arm and she'll have to get up and peel time. Initially plan to send home with increased Lasix dosing however patient persistently tachypneic even with short amounts of ambulation so we'll admit for IV diuresis and likely  discharge tomorrow.    Merrily Pew, MD 06/09/16 [id number]

## 2016-06-08 NOTE — ED Notes (Signed)
Hospitalist at bedside 

## 2016-06-08 NOTE — ED Notes (Signed)
Patient transported to X-ray 

## 2016-06-08 NOTE — ED Notes (Signed)
This Probation officer entered room and patient c/o increased SOB with exertion, labored breathing. EDP, Messner made aware of same, patient to be revaluated.

## 2016-06-08 NOTE — ED Notes (Signed)
C/o sob, cough and wheezing for 4 days. Used inhaler without relief.

## 2016-06-08 NOTE — H&P (Addendum)
History and Physical    [name] [id number] DOB: November 09, 1931 DOA: 06/08/2016  PCP: Jani Gravel, MD  Consultants: Cards - [facility] [place]; orthopedics [facility] Patient coming from: home  Chief Complaint: cough, SOB  HPI: [name] is a 82 y.o. female with medical history significant of CHF presenting with cough/SOB.  Patient reports 3-4 days of SOB, tonight "it just really hit me hard."  Came to ER and thought she could go home, but when she started moving around she has persistent DOE.  Stopped taking Lasix because she was having trouble urinating so much given amputation of leg in conjunction with broken arm.  Cough, productive of grayish sputum.  + wheezing.  Started out hot and sweaty, but never had fever.  Also had rhinorrhea/congestion at onset.  Mild nausea with decreased PO intake.  No rash.  Too tired and sweaty and SOB to do her usual activities.  Last Echo appears to have been done at [facility] in April 2015 and showed EF  50%.    ED Course: routine labs and studies; Lasix 40 mg IV  Review of Systems: As per HPI otherwise 10 point review of systems reviewed and negative.   Ambulatory Status:  Has one leg from remote BKA (approximately 2 years ago)  Past Medical History  Diagnosis Date  . Mitral regurgitation   . Hypertrophic obstructive cardiomyopathy     Required septal myotomy  . Urinary incontinence   . Cystitis   . Hemorrhoids   . Bronchitis   . Asthma   . Anemia   . Heart block     Complete heart block post surgical requiring pacer  . Hypokalemia     Postoperative, improved  . Hyponatremia     Mild postoperative, improved  . Blood transfusion   . Constipation   . Arthritis pain   . Cancer [facility])     lt breast  . Hypertension     Benign Essential Hypertension    . Current use of long term anticoagulation   . Chronic atrial fibrillation ([place])   . Arthritis   . Pacemaker   . Memory difficulty   . Humerus shaft fracture     nonsurgical, June 2017    Past  Surgical History  Procedure Laterality Date  . Anterior cervical diskectomy and fusion      C-7  . Septal myotomy    . Pacemaker placement  2006/11-27-2013    MDT dual chamber pacemaker implanted 2006 with gen change by Dr Lovena Le 11-27-2013  . Replacement total knee bilateral    . Femur fracture surgery      Right  . Wrist fracture surgery      Right  . Shoulder surgery      Right  . Breast biopsy      Left, negative  . Colonoscopy    . Abdominal hysterectomy    . Cardiomyopathy    . Breast lumpectomy  2012    left breast  . Joint replacement    . Permanent pacemaker generator change N/A 11/27/2013    Procedure: PERMANENT PACEMAKER GENERATOR CHANGE;  Surgeon: Evans Lance, MD;  Location: [facility] CATH LAB;  Service: Cardiovascular;  Laterality: N/A;  . Below knee leg amputation Right     Social History   Social History  . Marital Status: Married    Spouse Name: N/A  . Number of Children: N/A  . Years of Education: N/A   Occupational History  . Not on file.   Social  History Main Topics  . Smoking status: Never Smoker   . Smokeless tobacco: Not on file  . Alcohol Use: No  . Drug Use: No  . Sexual Activity: Not Currently   Other Topics Concern  . Not on file   Social History Narrative   Married   Two children   Husband will be assisting with care after surgery    Allergies  Allergen Reactions  . Ceftaroline Rash  . Codeine Nausea And Vomiting  . Morphine Nausea And Vomiting  . Sulfa Antibiotics Nausea And Vomiting and Nausea Only    Family History  Problem Relation Age of Onset  . Heart disease Mother   . Hypertension Mother   . Pancreatic cancer Mother   . Cancer Mother     pancreatic  . Pancreatic cancer Father   . Cancer Father     pancreatic  . Diabetes Brother   . Pancreatic cancer Other   . Heart disease Brother     Enlarged heart    Prior to Admission medications   Medication Sig Start Date End Date Taking? Authorizing Provider    acetaminophen (TYLENOL) 500 MG tablet Take 1,000 mg by mouth every 6 (six) hours as needed for moderate pain.    Yes Historical Provider, MD  albuterol (PROAIR HFA) 108 (90 BASE) MCG/ACT inhaler Inhale 2 puffs into the lungs every 6 (six) hours as needed for wheezing. 06/05/14  Yes Evans Lance, MD  alendronate (FOSAMAX) 70 MG tablet Take 70 mg by mouth every 7 (seven) days. Take with a full glass of water on an empty stomach. Takes on Thursday   Yes Historical Provider, MD  apixaban (ELIQUIS) 2.5 MG TABS tablet Take 1 tablet (2.5 mg total) by mouth 2 (two) times daily. 10/01/14  Yes Daniel J Angiulli, PA-C  aspirin 325 MG tablet Take 325 mg by mouth every 6 (six) hours as needed for mild pain.   Yes Historical Provider, MD  calcium citrate-vitamin D (CITRACAL+D) 315-200 MG-UNIT per tablet Take 1 tablet by mouth 2 (two) times daily. 10/01/14  Yes Daniel J Angiulli, PA-C  cyclobenzaprine (FLEXERIL) 5 MG tablet Take 1 tablet (5 mg total) by mouth daily. Patient taking differently: Take 5 mg by mouth daily as needed for muscle spasms.  10/01/14  Yes Daniel J Angiulli, PA-C  cycloSPORINE (RESTASIS) 0.05 % ophthalmic emulsion Place 1 drop into both eyes 2 (two) times daily. 10/01/14  Yes Daniel J Angiulli, PA-C  diltiazem (CARDIZEM CD) 180 MG 24 hr capsule Take 1 capsule (180 mg total) by mouth daily. 05/18/15  Yes Adrian Prows, MD  fluticasone (FLONASE) 50 MCG/ACT nasal spray Place 2 sprays into both nostrils 2 (two) times daily as needed for allergies.    Yes Historical Provider, MD  furosemide (LASIX) 20 MG tablet Take 1 tablet (20 mg total) by mouth 2 (two) times daily. 10/01/14  Yes Daniel J Angiulli, PA-C  mirtazapine (REMERON) 15 MG tablet Take 1 tablet (15 mg total) by mouth at bedtime. 10/01/14  Yes Daniel J Angiulli, PA-C  Multiple Vitamin (MULTIVITAMIN) tablet Take 1 tablet by mouth 2 (two) times daily.    Yes Historical Provider, MD  oxyCODONE 10 MG TABS Take 1 tablet (10 mg total) by mouth every  4 (four) hours as needed for severe pain. 10/01/14  Yes Daniel J Angiulli, PA-C  oxyCODONE-acetaminophen (PERCOCET/ROXICET) 5-325 MG tablet Take 1 tablet by mouth every 4 (four) hours as needed for severe pain. 05/31/16  Yes [name], MD  potassium  chloride SA (K-DUR,KLOR-CON) 20 MEQ tablet Take 1 tablet (20 mEq total) by mouth 2 (two) times daily. 10/01/14  Yes Daniel J Angiulli, PA-C  traMADol (ULTRAM) 50 MG tablet Take 50 mg by mouth every 6 (six) hours as needed for moderate pain.    Yes Historical Provider, MD  ELIQUIS 2.5 MG TABS tablet TAKE 1 TABLET TWICE A DAY (PLEASE CALL AND SCHEDULE AN APPOINTMENT FOR JUNE) Patient not taking: Reported on 06/08/2016 09/03/15   Evans Lance, MD  gabapentin (NEURONTIN) 300 MG capsule Take 1 capsule (300 mg total) by mouth 3 (three) times daily. Patient not taking: Reported on 06/08/2016 10/01/14   Lavon Paganini Angiulli, PA-C  metoprolol succinate (TOPROL-XL) 25 MG 24 hr tablet Take 1 tablet (25 mg total) by mouth daily. Patient not taking: Reported on 06/08/2016 10/01/14   Cathlyn Parsons, PA-C    Physical Exam: Filed Vitals:   06/08/16 2015 06/08/16 2241 06/09/16 0000  BP: 136/71 117/58 124/55  Pulse: 66 69 62  Temp: 98 F (36.7 C)    TempSrc: Oral    Resp: 24 22 30   Height: 5\' 2"  (1.575 m)    Weight: 58.514 kg (129 lb)    SpO2: 93% 95% 95%     General: Appears calm and comfortable but obviously SOB with conversation Eyes:  PERRL, EOMI, normal lids, iris ENT:  grossly normal hearing, lips & tongue, mmm Neck:  no LAD, masses or thyromegaly Cardiovascular:  RRR, with diffuse murmurs. No LE edema on L.  Respiratory: Increased work of breathing, mild.  Crackles present about 1/2 up lung fields. Abdomen:  soft, ntnd, NABS Skin:  no rash or induration seen on limited exam Musculoskeletal: s/p R BKA Psychiatric: grossly normal mood and affect, speech fluent and appropriate, AOx3 Neurologic:  CN 2-12 grossly intact, moves all extremities in  coordinated fashion, sensation intact  Labs on Admission: I have personally reviewed following labs and imaging studies  CBC:  Recent Labs Lab 06/08/16 2122  WBC 7.9  NEUTROABS 6.7  HGB 12.2  HCT 36.4  MCV 99.2  PLT [id number]   Basic Metabolic Panel:  Recent Labs Lab 06/08/16 2122  NA 138  K 4.4  CL 107  CO2 24  GLUCOSE 97  BUN 18  CREATININE 0.83  CALCIUM 9.2   GFR: Estimated Creatinine Clearance: 41.3 mL/min (by C-G formula based on Cr of 0.83). Liver Function Tests:  Recent Labs Lab 06/08/16 2122  AST 47*  ALT 26  ALKPHOS 77  BILITOT 1.7*  PROT 6.8  ALBUMIN 4.1   No results for input(s): LIPASE, AMYLASE in the last 168 hours. No results for input(s): AMMONIA in the last 168 hours. Coagulation Profile: No results for input(s): INR, PROTIME in the last 168 hours. Cardiac Enzymes:  Recent Labs Lab 06/08/16 2122  TROPONINI 0.06*   BNP (last 3 results) No results for input(s): PROBNP in the last 8760 hours. HbA1C: No results for input(s): HGBA1C in the last 72 hours. CBG: No results for input(s): GLUCAP in the last 168 hours. Lipid Profile: No results for input(s): CHOL, HDL, LDLCALC, TRIG, CHOLHDL, LDLDIRECT in the last 72 hours. Thyroid Function Tests: No results for input(s): TSH, T4TOTAL, FREET4, T3FREE, THYROIDAB in the last 72 hours. Anemia Panel: No results for input(s): VITAMINB12, FOLATE, FERRITIN, TIBC, IRON, RETICCTPCT in the last 72 hours. Urine analysis:    Component Value Date/Time   COLORURINE YELLOW 05/18/2015 0024   APPEARANCEUR CLOUDY* 05/18/2015 0024   LABSPEC 1.009 05/18/2015 0024  PHURINE 5.5 05/18/2015 0024   GLUCOSEU NEGATIVE 05/18/2015 0024   HGBUR TRACE* 05/18/2015 0024   BILIRUBINUR NEGATIVE 05/18/2015 0024   KETONESUR NEGATIVE 05/18/2015 0024   PROTEINUR 30* 05/18/2015 0024   UROBILINOGEN 1.0 05/18/2015 0024   NITRITE NEGATIVE 05/18/2015 0024   LEUKOCYTESUR MODERATE* 05/18/2015 0024    Creatinine  Clearance: Estimated Creatinine Clearance: 41.3 mL/min (by C-G formula based on Cr of 0.83).  Sepsis Labs: @LABRCNTIP (procalcitonin:4,lacticidven:4) )No results found for this or any previous visit (from the past 240 hour(s)).   Radiological Exams on Admission: Dg Chest 2 View  06/08/2016  CLINICAL DATA:  Acute onset of shortness of breath, cough and wheezing. Initial encounter. EXAM: CHEST  2 VIEW COMPARISON:  Chest radiograph performed 05/17/2015 FINDINGS: The lungs are well-aerated. Mild right midlung opacity could reflect pneumonia, given the patient's symptoms. There is no evidence of pleural effusion or pneumothorax. The heart is mildly enlarged. Calcification is noted at the mitral valve. The patient is status post median sternotomy. A pacemaker is noted at the right chest wall, with leads ending at the right atrium and right ventricle. No acute osseous abnormalities are seen. Cervical spinal fusion hardware is noted. The patient's right shoulder arthroplasty is grossly unremarkable in appearance, though incompletely imaged. IMPRESSION: 1. Mild right midlung opacity could reflect pneumonia, given the patient's symptoms. 2. Mild cardiomegaly.  Calcification at the mitral valve. Electronically Signed   By: Garald Balding M.D.   On: 06/08/2016 20:52   Dg Humerus Right  06/08/2016  CLINICAL DATA:  82 year old female with worsening right humeral fracture. EXAM: RIGHT HUMERUS - 2+ VIEW COMPARISON:  Right femur radiograph dated 05/31/2016 FINDINGS: There is a spiral fracture of the mid shaft of the right humerus starting adjacent to the distal aspect of the humeral stem of the prosthesis an extending to the distal diaphysis. There is no displacement, however there has been interval widening of the fracture gap compared to the prior study. The fracture distraction measures approximately 5 mm. No other fracture identified. The bones are osteopenic. The right shoulder prosthesis appears in similar  positioning as the prior study. The bones are osteopenic. The soft tissues appear unremarkable. IMPRESSION: Interval increase in the distraction of the previously seen spiral fracture involving the mid to distal right humeral diaphysis. Electronically Signed   By: Anner Crete M.D.   On: 06/08/2016 21:30    EKG: Independently reviewed. Atrial flutter but ventricularly paced so appears to be regular rate (65)  Assessment/Plan Principal Problem:   Acute respiratory failure (HCC) Active Problems:   Essential hypertension   PACEMAKER, PERMANENT   Atrial fibrillation (HCC)   Diastolic CHF, acute on chronic (HCC)   Humerus fracture   Hx of right BKA ([place])   1. Acute respiratory failure - most likely etiology appears to be acute CHF exacerbation.  Last Echo was in 2015 and showed normal EF, but patient has a number of valvular abnormalities and a reported h/o diastolic dysfunction.  She also stopped taking her Lasix recently following R humeral fracture - it was too difficult to get up frequently to void with R BKA and R humeral fracture.  She also does report some vague URI symptoms at the onset of her SOB/cough and CXR was read as possible infiltrate.  As such, will admit for CHF exacerbation without antibiotics at this time, but would have low threshold for initiating antibiotics if new/progressive symptoms occur.  For now, will admit to observation status on telemetry floor with serial CE; daily ASA (  was taking 325 q6h prn - this was discontinued); Lasix 40 mg IV given in ER and will repeat x 1 in 12 hours; and will monitor I/O with foley catheter.  Repeat EKG and Echo in AM.  Patient is not currently taking ACE or BB (is taking CCB).  Will start low-dose Lisinopril and would suggest consideration of beta blocker during or shortly after hospitalization (pending results of Echo).  Low salt/heart healthy diet.  2. HTN - as above.  Takes Cardizem, presumably as a result of afib but since she has a  cardiac pacer, rate controlling agents would not be essential for her.  Instead, would consider discontinuation of Cardizem and initiation of Coreg.  For now, Cardizem continued and low-dose Lisinopril was added.  Of note, chart reports that patient was prescribed Toprol XL but is not currently taking.  3. Afib, on anticoagulation - Continue Eliquis.  4. Humerus fracture - patient reports being followed for her humerus fracture and that they are undecided about whether surgery would be beneficial.  Suggest consideration of orthopedics consult while inpatient, as this fracture is negatively contributing to her overall health and is creating significant challenges for her with regards to mobility.   DVT prophylaxis: full anticoagulation  Code Status: full - confirmed with patient  Family Communication: none.  NOK is her husband.  Disposition Plan: home once clinically improved Consults called: none; consider orthopedics  Admission status: Observation   Karmen Bongo MD [facility]  If 7PM-7AM, please contact night-coverage [email or web address] Password [facility]  06/09/2016, 12:48 AM

## 2016-06-09 ENCOUNTER — Observation Stay (HOSPITAL_BASED_OUTPATIENT_CLINIC_OR_DEPARTMENT_OTHER): Payer: Medicare Other

## 2016-06-09 ENCOUNTER — Encounter (HOSPITAL_COMMUNITY): Payer: Self-pay | Admitting: Internal Medicine

## 2016-06-09 ENCOUNTER — Other Ambulatory Visit: Payer: Self-pay

## 2016-06-09 DIAGNOSIS — Z981 Arthrodesis status: Secondary | ICD-10-CM | POA: Diagnosis not present

## 2016-06-09 DIAGNOSIS — M810 Age-related osteoporosis without current pathological fracture: Secondary | ICD-10-CM | POA: Diagnosis not present

## 2016-06-09 DIAGNOSIS — J9600 Acute respiratory failure, unspecified whether with hypoxia or hypercapnia: Secondary | ICD-10-CM

## 2016-06-09 DIAGNOSIS — Z833 Family history of diabetes mellitus: Secondary | ICD-10-CM | POA: Diagnosis not present

## 2016-06-09 DIAGNOSIS — R05 Cough: Secondary | ICD-10-CM | POA: Diagnosis not present

## 2016-06-09 DIAGNOSIS — I5033 Acute on chronic diastolic (congestive) heart failure: Secondary | ICD-10-CM | POA: Diagnosis not present

## 2016-06-09 DIAGNOSIS — G8918 Other acute postprocedural pain: Secondary | ICD-10-CM | POA: Diagnosis not present

## 2016-06-09 DIAGNOSIS — R278 Other lack of coordination: Secondary | ICD-10-CM | POA: Diagnosis not present

## 2016-06-09 DIAGNOSIS — I110 Hypertensive heart disease with heart failure: Secondary | ICD-10-CM | POA: Diagnosis not present

## 2016-06-09 DIAGNOSIS — M6281 Muscle weakness (generalized): Secondary | ICD-10-CM | POA: Diagnosis not present

## 2016-06-09 DIAGNOSIS — S42309A Unspecified fracture of shaft of humerus, unspecified arm, initial encounter for closed fracture: Secondary | ICD-10-CM | POA: Diagnosis present

## 2016-06-09 DIAGNOSIS — J45909 Unspecified asthma, uncomplicated: Secondary | ICD-10-CM | POA: Diagnosis present

## 2016-06-09 DIAGNOSIS — Z89511 Acquired absence of right leg below knee: Secondary | ICD-10-CM

## 2016-06-09 DIAGNOSIS — Z4789 Encounter for other orthopedic aftercare: Secondary | ICD-10-CM | POA: Diagnosis not present

## 2016-06-09 DIAGNOSIS — R29898 Other symptoms and signs involving the musculoskeletal system: Secondary | ICD-10-CM | POA: Diagnosis not present

## 2016-06-09 DIAGNOSIS — R0602 Shortness of breath: Secondary | ICD-10-CM | POA: Diagnosis not present

## 2016-06-09 DIAGNOSIS — Z79899 Other long term (current) drug therapy: Secondary | ICD-10-CM | POA: Diagnosis not present

## 2016-06-09 DIAGNOSIS — Z7951 Long term (current) use of inhaled steroids: Secondary | ICD-10-CM | POA: Diagnosis not present

## 2016-06-09 DIAGNOSIS — S42301D Unspecified fracture of shaft of humerus, right arm, subsequent encounter for fracture with routine healing: Secondary | ICD-10-CM | POA: Diagnosis not present

## 2016-06-09 DIAGNOSIS — Z950 Presence of cardiac pacemaker: Secondary | ICD-10-CM | POA: Diagnosis not present

## 2016-06-09 DIAGNOSIS — I10 Essential (primary) hypertension: Secondary | ICD-10-CM | POA: Diagnosis not present

## 2016-06-09 DIAGNOSIS — Z881 Allergy status to other antibiotic agents status: Secondary | ICD-10-CM | POA: Diagnosis not present

## 2016-06-09 DIAGNOSIS — I442 Atrioventricular block, complete: Secondary | ICD-10-CM | POA: Diagnosis present

## 2016-06-09 DIAGNOSIS — Z800 Family history of malignant neoplasm of digestive organs: Secondary | ICD-10-CM | POA: Diagnosis not present

## 2016-06-09 DIAGNOSIS — I482 Chronic atrial fibrillation: Secondary | ICD-10-CM | POA: Diagnosis not present

## 2016-06-09 DIAGNOSIS — J9601 Acute respiratory failure with hypoxia: Secondary | ICD-10-CM

## 2016-06-09 DIAGNOSIS — S42301G Unspecified fracture of shaft of humerus, right arm, subsequent encounter for fracture with delayed healing: Secondary | ICD-10-CM | POA: Diagnosis not present

## 2016-06-09 DIAGNOSIS — I421 Obstructive hypertrophic cardiomyopathy: Secondary | ICD-10-CM | POA: Diagnosis present

## 2016-06-09 DIAGNOSIS — E440 Moderate protein-calorie malnutrition: Secondary | ICD-10-CM | POA: Insufficient documentation

## 2016-06-09 DIAGNOSIS — Z882 Allergy status to sulfonamides status: Secondary | ICD-10-CM | POA: Diagnosis not present

## 2016-06-09 DIAGNOSIS — Z6822 Body mass index (BMI) 22.0-22.9, adult: Secondary | ICD-10-CM | POA: Diagnosis not present

## 2016-06-09 DIAGNOSIS — Z8249 Family history of ischemic heart disease and other diseases of the circulatory system: Secondary | ICD-10-CM | POA: Diagnosis not present

## 2016-06-09 DIAGNOSIS — R41841 Cognitive communication deficit: Secondary | ICD-10-CM | POA: Diagnosis not present

## 2016-06-09 DIAGNOSIS — I340 Nonrheumatic mitral (valve) insufficiency: Secondary | ICD-10-CM | POA: Diagnosis not present

## 2016-06-09 DIAGNOSIS — Z96653 Presence of artificial knee joint, bilateral: Secondary | ICD-10-CM | POA: Diagnosis present

## 2016-06-09 DIAGNOSIS — N179 Acute kidney failure, unspecified: Secondary | ICD-10-CM | POA: Diagnosis not present

## 2016-06-09 DIAGNOSIS — R5381 Other malaise: Secondary | ICD-10-CM | POA: Diagnosis not present

## 2016-06-09 DIAGNOSIS — I248 Other forms of acute ischemic heart disease: Secondary | ICD-10-CM | POA: Diagnosis not present

## 2016-06-09 DIAGNOSIS — I509 Heart failure, unspecified: Secondary | ICD-10-CM

## 2016-06-09 DIAGNOSIS — Z885 Allergy status to narcotic agent status: Secondary | ICD-10-CM | POA: Diagnosis not present

## 2016-06-09 DIAGNOSIS — R2681 Unsteadiness on feet: Secondary | ICD-10-CM | POA: Diagnosis not present

## 2016-06-09 DIAGNOSIS — Z7901 Long term (current) use of anticoagulants: Secondary | ICD-10-CM | POA: Diagnosis not present

## 2016-06-09 HISTORY — DX: Acute respiratory failure, unspecified whether with hypoxia or hypercapnia: J96.00

## 2016-06-09 LAB — TROPONIN I
Troponin I: 0.06 ng/mL — ABNORMAL HIGH (ref <0.031)
Troponin I: 0.06 ng/mL — ABNORMAL HIGH (ref <0.031)
Troponin I: 0.12 ng/mL — ABNORMAL HIGH (ref <0.031)
Troponin I: 0.12 ng/mL — ABNORMAL HIGH (ref <0.031)

## 2016-06-09 LAB — ECHOCARDIOGRAM COMPLETE
E decel time: 243 msec
FS: 29 % (ref 28–44)
Height: 62 in
IVS/LV PW RATIO, ED: 0.93
LA ID, A-P, ES: 63 mm
LA diam end sys: 63 mm
LA diam index: 4.01 cm/m2
LA vol A4C: 143 ml
LA vol index: 82.8 mL/m2
LA vol: 130 mL
LV PW d: 15.4 mm — AB (ref 0.6–1.1)
LVOT area: 2.27 cm2
LVOT diameter: 17 mm
MV Dec: 243
MV Peak grad: 3 mmHg
MV pk A vel: 28.4 m/s
MV pk E vel: 82.6 m/s
Reg peak vel: 396 cm/s
TR max vel: 396 cm/s
Weight: 2007.07 oz

## 2016-06-09 LAB — CBC
HCT: 36.4 % (ref 36.0–46.0)
Hemoglobin: 12.1 g/dL (ref 12.0–15.0)
MCH: 32.8 pg (ref 26.0–34.0)
MCHC: 33.2 g/dL (ref 30.0–36.0)
MCV: 98.6 fL (ref 78.0–100.0)
Platelets: 179 10*3/uL (ref 150–400)
RBC: 3.69 MIL/uL — ABNORMAL LOW (ref 3.87–5.11)
RDW: 15.4 % (ref 11.5–15.5)
WBC: 8.9 10*3/uL (ref 4.0–10.5)

## 2016-06-09 LAB — CREATININE, SERUM
Creatinine, Ser: 0.79 mg/dL (ref 0.44–1.00)
GFR calc Af Amer: >60 mL/min (ref >60)
GFR calc non Af Amer: >60 mL/min (ref >60)

## 2016-06-09 MED ORDER — LISINOPRIL 2.5 MG PO TABS
2.5000 mg | ORAL_TABLET | Freq: Every day | ORAL | Status: DC
  Administered 2016-06-09 – 2016-06-10 (×2): 2.5 mg via ORAL
  Filled 2016-06-09 (×3): qty 1

## 2016-06-09 MED ORDER — OXYCODONE HCL 5 MG PO TABS
10.0000 mg | ORAL_TABLET | ORAL | Status: DC | PRN
  Administered 2016-06-09 – 2016-06-13 (×5): 10 mg via ORAL
  Filled 2016-06-09 (×6): qty 2

## 2016-06-09 MED ORDER — SODIUM CHLORIDE 0.9 % IV SOLN: 250.0000 mL | INTRAVENOUS | Status: DC | PRN

## 2016-06-09 MED ORDER — PERFLUTREN LIPID MICROSPHERE
1.0000 mL | INTRAVENOUS | Status: DC | PRN
  Administered 2016-06-09: 2 mL via INTRAVENOUS
  Filled 2016-06-09: qty 10

## 2016-06-09 MED ORDER — ALBUTEROL SULFATE (2.5 MG/3ML) 0.083% IN NEBU: 2.5000 mg | INHALATION_SOLUTION | Freq: Four times a day (QID) | RESPIRATORY_TRACT | Status: DC | PRN

## 2016-06-09 MED ORDER — OXYCODONE-ACETAMINOPHEN 5-325 MG PO TABS
1.0000 | ORAL_TABLET | ORAL | Status: DC | PRN
  Administered 2016-06-09: 1 via ORAL
  Filled 2016-06-09: qty 1

## 2016-06-09 MED ORDER — CYCLOSPORINE 0.05 % OP EMUL
1.0000 [drp] | Freq: Two times a day (BID) | OPHTHALMIC | Status: DC
  Administered 2016-06-09 – 2016-06-13 (×10): 1 [drp] via OPHTHALMIC
  Filled 2016-06-09 (×11): qty 1

## 2016-06-09 MED ORDER — TRAMADOL HCL 50 MG PO TABS: 50.0000 mg | ORAL_TABLET | Freq: Four times a day (QID) | ORAL | Status: DC | PRN

## 2016-06-09 MED ORDER — ALENDRONATE SODIUM 70 MG PO TABS: 70.0000 mg | ORAL_TABLET | ORAL | Status: DC

## 2016-06-09 MED ORDER — SODIUM CHLORIDE 0.9% FLUSH
3.0000 mL | Freq: Two times a day (BID) | INTRAVENOUS | Status: DC
  Administered 2016-06-09 – 2016-06-11 (×7): 3 mL via INTRAVENOUS

## 2016-06-09 MED ORDER — CYCLOBENZAPRINE HCL 5 MG PO TABS
5.0000 mg | ORAL_TABLET | Freq: Every day | ORAL | Status: DC | PRN
  Administered 2016-06-09: 5 mg via ORAL
  Filled 2016-06-09: qty 1

## 2016-06-09 MED ORDER — FLUTICASONE PROPIONATE 50 MCG/ACT NA SUSP
2.0000 | Freq: Two times a day (BID) | NASAL | Status: DC | PRN
  Filled 2016-06-09: qty 16

## 2016-06-09 MED ORDER — ACETAMINOPHEN 500 MG PO TABS: 1000.0000 mg | ORAL_TABLET | Freq: Four times a day (QID) | ORAL | Status: DC | PRN

## 2016-06-09 MED ORDER — FUROSEMIDE 10 MG/ML IJ SOLN
40.0000 mg | Freq: Two times a day (BID) | INTRAMUSCULAR | Status: AC
  Administered 2016-06-09 (×2): 40 mg via INTRAVENOUS
  Filled 2016-06-09 (×2): qty 4

## 2016-06-09 MED ORDER — ASPIRIN EC 81 MG PO TBEC
81.0000 mg | DELAYED_RELEASE_TABLET | Freq: Every day | ORAL | Status: DC
  Administered 2016-06-09 – 2016-06-13 (×5): 81 mg via ORAL
  Filled 2016-06-09 (×6): qty 1

## 2016-06-09 MED ORDER — ENSURE ENLIVE PO LIQD
237.0000 mL | Freq: Two times a day (BID) | ORAL | Status: DC
  Administered 2016-06-09 – 2016-06-10 (×2): 237 mL via ORAL

## 2016-06-09 MED ORDER — ALBUTEROL SULFATE HFA 108 (90 BASE) MCG/ACT IN AERS: 2.0000 | INHALATION_SPRAY | Freq: Four times a day (QID) | RESPIRATORY_TRACT | Status: DC | PRN

## 2016-06-09 MED ORDER — APIXABAN 2.5 MG PO TABS
2.5000 mg | ORAL_TABLET | Freq: Two times a day (BID) | ORAL | Status: DC
  Administered 2016-06-09 – 2016-06-13 (×10): 2.5 mg via ORAL
  Filled 2016-06-09 (×10): qty 1

## 2016-06-09 MED ORDER — ONDANSETRON HCL 4 MG/2ML IJ SOLN: 4.0000 mg | Freq: Four times a day (QID) | INTRAMUSCULAR | Status: DC | PRN

## 2016-06-09 MED ORDER — POTASSIUM CHLORIDE CRYS ER 20 MEQ PO TBCR
20.0000 meq | EXTENDED_RELEASE_TABLET | Freq: Two times a day (BID) | ORAL | Status: DC
  Administered 2016-06-09 – 2016-06-11 (×5): 20 meq via ORAL
  Filled 2016-06-09 (×5): qty 1

## 2016-06-09 MED ORDER — DILTIAZEM HCL ER COATED BEADS 180 MG PO CP24
180.0000 mg | ORAL_CAPSULE | Freq: Every day | ORAL | Status: DC
  Administered 2016-06-09 – 2016-06-13 (×5): 180 mg via ORAL
  Filled 2016-06-09 (×5): qty 1

## 2016-06-09 MED ORDER — MIRTAZAPINE 15 MG PO TABS
15.0000 mg | ORAL_TABLET | Freq: Every day | ORAL | Status: DC
  Administered 2016-06-09 – 2016-06-12 (×5): 15 mg via ORAL
  Filled 2016-06-09 (×5): qty 1

## 2016-06-09 MED ORDER — SODIUM CHLORIDE 0.9% FLUSH: 3.0000 mL | INTRAVENOUS | Status: DC | PRN

## 2016-06-09 MED ORDER — ACETAMINOPHEN 325 MG PO TABS
650.0000 mg | ORAL_TABLET | Freq: Four times a day (QID) | ORAL | Status: DC | PRN
  Administered 2016-06-10 – 2016-06-11 (×3): 650 mg via ORAL
  Filled 2016-06-09 (×3): qty 2

## 2016-06-09 MED ORDER — PERFLUTREN LIPID MICROSPHERE
INTRAVENOUS | Status: AC
  Filled 2016-06-09: qty 10

## 2016-06-09 NOTE — Progress Notes (Signed)
Initial Nutrition Assessment  DOCUMENTATION CODES:   Non-severe (moderate) malnutrition in context of acute illness/injury  INTERVENTION:  - Continue Ensure Enlive po BID, each supplement provides 350 kcal and 20 grams of protein - Encourage PO intakes of meals and supplements. - RD will continue to monitor for additional needs.  NUTRITION DIAGNOSIS:   Inadequate oral intake related to acute illness, poor appetite as evidenced by per patient/family report.  GOAL:   Patient will meet greater than or equal to 90% of their needs  MONITOR:   PO intake, Supplement acceptance, Weight trends, Labs, I & O's  REASON FOR ASSESSMENT:   Malnutrition Screening Tool  ASSESSMENT:   82 y.o. female with medical history significant of CHF presenting with cough/SOB. Patient reports 3-4 days of SOB, tonight "it just really hit me hard." Came to ER and thought she could go home, but when she started moving around she has persistent DOE. Stopped taking Lasix because she was having trouble urinating so much given amputation of leg in conjunction with broken arm. Cough, productive of grayish sputum. + wheezing. Started out hot and sweaty, but never had fever. Also had rhinorrhea/congestion at onset. Mild nausea with decreased PO intake. No rash. Too tired and sweaty and SOB to do her usual activities.  Pt seen for MST. BMI indicates normal weight. No intakes documented since admission. Pt reports that for the past few weeks she has had a decrease in appetite and associated intakes. She denies eating earlier today but states that lunch tray to arrive soon (lunch arrived while RD in the room). Pt s/p R AKA approximately 15 months ago. Recent R arm fx with no plan for surgery or casting, per discussion with pt. She states that she is L hand dominant and that broken arm does not affect ability to self-feed.   Pt states that she is feeling a little more hungry this afternoon. Unable to have in depth  conversation with pt at this time. Preacher in with pt at time of initial visit and lunch arrived and sister-in-law came to visit during time of RD visit; pt desiring to eat and spend time with relative.   RD will continue to monitor for needs. Ensure Enlive already ordered BID. Physical assessment shows mild fat wasting and mild/moderate muscle wasting to upper body. Unable to ask pt about weight hx and very limited weight hx available in the chart; will assess at follow-up.  Medications reviewed; 40 mg IV Lasix every 12 hours, 20 mEq KCl BID. Labs reviewed.   Diet Order:  Diet Heart Room service appropriate?: Yes; Fluid consistency:: Thin  Skin:  Reviewed, no issues  Last BM:  6/21  Height:   Ht Readings from Last 1 Encounters:  06/08/16 5\' 2"  (1.575 m)    Weight:   Wt Readings from Last 1 Encounters:  06/09/16 125 lb 7.1 oz (56.9 kg)    Ideal Body Weight:  50 kg (kg)  BMI:  Body mass index is 22.94 kg/(m^2).  Estimated Nutritional Needs:   Kcal:  1400-1600  Protein:  55-65 grams  Fluid:  1.5 L/day  EDUCATION NEEDS:   No education needs identified at this time     Jarome Matin, MS, RD, LDN Inpatient Clinical Dietitian Pager # [phone] After hours/weekend pager # [phone]

## 2016-06-09 NOTE — Progress Notes (Signed)
  Echocardiogram 2D Echocardiogram has been performed.  Diamond Nickel 06/09/2016, 12:08 PM

## 2016-06-09 NOTE — Care Management Obs Status (Signed)
[facility] NOTIFICATION   Patient Details  Name: [name] MRN: [id number] Date of Birth: [birth year]   Medicare Observation Status Notification Given:  Yes    Dessa Phi, RN 06/09/2016, 12:27 PM

## 2016-06-09 NOTE — Progress Notes (Addendum)
PROGRESS NOTE  [name] [id number] DOB: 1931-03-17 DOA: 06/08/2016 PCP: Jani Gravel, MD Cards - [facility] [place]; orthopedics - [facility]  Brief Narrative: 82 year old woman with history of CHF presented with cough, shortness of breath. Recently stopped taking Lasix because of difficulty getting to the bathroom with history of amputation of leg and recent broken arm. Admitted for acute respiratory failure, suspected CHF exacerbation in context of discontinuation of Lasix.  Assessment/Plan: 1. Acute diastolic CHF exacerbation, somewhat improved with diuresis. Chest x-ray with opacity, however no clinical features to suggest pneumonia. No leukocytosis. No fever. No treatment indicated. 2. Acute respiratory failure with hypoxia, secondary to acute diastolic failure. Much improved. 3. Elevated troponin, demand ischemia. EKG shows a paced rhythm. Troponin modestly elevated. No chest pain. No history to suggest ACS. No further evaluation suggested. 4. Chronic atrial fibrillation, continue Eliquis 5. Recent humeral fracture, making mobility challenging 6. Non-severe (moderate) malnutrition in context of acute illness/injury 7. PMH hypertrophic obstructive cardiomyopathy, status post septal myotomy; complete heart block, status post pacemaker placement   Overall somewhat improved. Plan continue IV diuresis, check basic metabolic panel in the morning.  Therapy evaluations--no need to hold off on evaluations, may proceed.  Possible discharge 6/23  DVT prophylaxis: Eliquis Code Status: full code Family Communication: none Disposition Plan: home  Murray Hodgkins, MD  [facility] Direct contact: [phone] --[place] [place]  --[email or web address]; password [email or web address]  7PM-7AM contact night coverage as above 06/09/2016, 11:52 AM    Consultants:    Procedures:  Echo Study Conclusions  - Procedure narrative: Transthoracic echocardiography. Image  quality was adequate. The study was  technically difficult.  Intravenous contrast (Definity) was administered. - Left ventricle: Small ventrical apex not well seen possible non  obstructive HOCM. No SAM or LVOT gradient. The cavity size was  normal. Wall thickness was increased in a pattern of severe LVH.  Systolic function was normal. The estimated ejection fraction was  in the range of 50% to 55%. - Aortic valve: There was trivial regurgitation. - Left atrium: The atrium was severely dilated. - Atrial septum: No defect or patent foramen ovale was identified. - Pulmonary arteries: PA peak pressure: 66 mm Hg (S).   Antimicrobials:    HPI/Subjective: Still short of breath but feels about 50% better. No pain other than in right arm. No chest pain.  Objective: Filed Vitals:   06/09/16 0113 06/09/16 0152 06/09/16 0627 06/09/16 0806  BP:  138/65 129/64 119/61  Pulse:  61 60 63  Temp:  98.1 F (36.7 C) 98.2 F (36.8 C) 98.7 F (37.1 C)  TempSrc:  Oral Oral Oral  Resp:  20 20 20   Height:      Weight: 56.9 kg (125 lb 7.1 oz)     SpO2:  92% 94% 94%    Intake/Output Summary (Last 24 hours) at 06/09/16 1152 Last data filed at 06/09/16 0811  Gross per 24 hour  Intake      0 ml  Output   3300 ml  Net  -3300 ml     Filed Weights   06/08/16 2015 06/09/16 0113  Weight: 58.514 kg (129 lb) 56.9 kg (125 lb 7.1 oz)    Exam:    Constitutional:  . Appears calm and comfortable Respiratory:  . Few rails, no rhonchi or wheezes.  . Mild increased respiratory effort. No retractions or accessory muscle use Cardiovascular:  . RRR, no m/r/g . 1+ LLE extremity edema   . Telemetry paced rhythm Psychiatric:  . judgement and insight appear  normal . Mental status o Mood, affect appropriate  I have personally reviewed following labs and imaging studies:  troponins .06>>0.12  CBC unremarkable  Scheduled Meds: . apixaban  2.5 mg Oral BID  . aspirin EC  81 mg Oral Daily  . cycloSPORINE  1 drop Both Eyes BID  .  diltiazem  180 mg Oral Daily  . feeding supplement (ENSURE ENLIVE)  237 mL Oral BID BM  . furosemide  40 mg Intravenous Q12H  . lisinopril  2.5 mg Oral Daily  . mirtazapine  15 mg Oral QHS  . potassium chloride SA  20 mEq Oral BID  . sodium chloride flush  3 mL Intravenous Q12H   Continuous Infusions:   Principal Problem:   Acute respiratory failure (HCC) Active Problems:   Essential hypertension   PACEMAKER, PERMANENT   Atrial fibrillation (HCC)   Diastolic CHF, acute on chronic (HCC)   Humerus fracture   Hx of right BKA (HCC)     Time spent 20 minutes

## 2016-06-09 NOTE — Progress Notes (Signed)
MD paged to be made aware of increased Troponin of 0.12. No new orders.

## 2016-06-09 NOTE — Care Management Note (Signed)
Case Management Note  Patient Details  Name: [name] MRN: [id number] Date of Birth: 1931-10-14  Subjective/Objective:82 y/o f admitted w/CHF. From home.Has pcp,pharmacy.Would recc PT cons.                    Action/Plan:d/c plan home.   Expected Discharge Date:                  Expected Discharge Plan:  [place]  In-House Referral:     Discharge planning Services  CM Consult  Post Acute Care Choice:    Choice offered to:     DME Arranged:    DME Agency:     HH Arranged:    [place] Agency:     Status of Service:  In process, will continue to follow  If discussed at Long Length of Stay Meetings, dates discussed:    Additional Comments:  Dessa Phi, RN 06/09/2016, 12:29 PM

## 2016-06-09 NOTE — Progress Notes (Signed)
OT Cancellation Note  Patient Details Name: MERELENE THUNBERG MRN: [id number] DOB: 10-Jun-1931   Cancelled Treatment:    Reason Eval/Treat Not Completed: Medical issues which prohibited therapy - Troponins trending upward.  Will initiate OT once troponins start to trend downward.   Darlina Rumpf [place], OTR/L [id number]  06/09/2016, 2:53 PM

## 2016-06-10 LAB — BASIC METABOLIC PANEL
Anion gap: 10 (ref 5–15)
BUN: 26 mg/dL — ABNORMAL HIGH (ref 6–20)
CO2: 27 mmol/L (ref 22–32)
Calcium: 8.7 mg/dL — ABNORMAL LOW (ref 8.9–10.3)
Chloride: 103 mmol/L (ref 101–111)
Creatinine, Ser: 1.24 mg/dL — ABNORMAL HIGH (ref 0.44–1.00)
GFR calc Af Amer: 46 mL/min — ABNORMAL LOW (ref >60)
GFR calc non Af Amer: 39 mL/min — ABNORMAL LOW (ref >60)
Glucose, Bld: 92 mg/dL (ref 65–99)
Potassium: 4.8 mmol/L (ref 3.5–5.1)
Sodium: 140 mmol/L (ref 135–145)

## 2016-06-10 MED ORDER — FUROSEMIDE 10 MG/ML IJ SOLN
40.0000 mg | Freq: Two times a day (BID) | INTRAMUSCULAR | Status: DC
Start: 2016-06-10 — End: 2016-06-11
  Administered 2016-06-10 – 2016-06-11 (×2): 40 mg via INTRAVENOUS
  Filled 2016-06-10 (×2): qty 4

## 2016-06-10 NOTE — Progress Notes (Addendum)
PROGRESS NOTE  [name] [id number] DOB: Nov 12, 1931 DOA: 06/08/2016 PCP: Jani Gravel, MD  Brief Narrative: 82 year old woman with history of CHF presented with cough, shortness of breath. Recently stopped taking Lasix because of difficulty getting to the bathroom with history of amputation of leg and recent broken arm. Admitted for acute respiratory failure, suspected CHF exacerbation in context of discontinuation of Lasix. Condition gradually improving with diuresis. Seen by physical and occupational therapy with recommendation for skilled nursing facility placement.  Assessment/Plan: 1. Acute diastolic congestive heart failure exacerbation, slowly improving with diuresis. -4 L since admission. Initial chest x-ray with an opacity have no features to suggest pneumonia. No leukocytosis or fever. No treatment indicated. 2. Acute respiratory failure with hypoxia secondary to CHF, resolved. 3. Demand ischemia secondary to acute CHF. EKG showed paced rhythm. Troponins flat. No evidence of ACS. 4. Chronic atrial fibrillation, continue anticoagulation 5. Recent humeral fracture, followed as an outpatient by orthopedics 6. Monitor malnutrition 7. PMH hypertrophic obstructive cardiomyopathy, status post septal myotomy; complete heart block, status post pacemaker placement   Slowly improving. Continue IV diuresis, check BMP in the morning.  Appreciate physical and occupational therapy evaluations. Agree with short-term rehabilitation.  Anticipate transfer skilled nursing facility 48 hours  DVT prophylaxis: apixaban Code Status: full code Family Communication: none Disposition Plan: SNF  Murray Hodgkins, MD  [facility] Direct contact: [phone] --Via amion app OR  --[email or web address]; password [email or web address]  7PM-7AM contact night coverage as above 06/10/2016, 5:25 PM  LOS: 1 day   Consultants:    Procedures:    Antimicrobials:    HPI/Subjective: Still SOB. Fair  appetite.  Objective: Filed Vitals:   06/09/16 1326 06/09/16 2216 06/10/16 0534 06/10/16 1401  BP: 111/56 115/62 118/58 117/57  Pulse: 63  60 65  Temp: 98.1 F (36.7 C) 98 F (36.7 C) 97.8 F (36.6 C) 97.9 F (36.6 C)  TempSrc: Oral Oral Oral Oral  Resp: 18 16 18 18   Height:      Weight:   54.2 kg (119 lb 7.8 oz)   SpO2: 94% 93% 96% 95%    Intake/Output Summary (Last 24 hours) at 06/10/16 1725 Last data filed at 06/10/16 1500  Gross per 24 hour  Intake      0 ml  Output   1500 ml  Net  -1500 ml     Filed Weights   06/08/16 2015 06/09/16 0113 06/10/16 0534  Weight: 58.514 kg (129 lb) 56.9 kg (125 lb 7.1 oz) 54.2 kg (119 lb 7.8 oz)    Exam:    Constitutional:  . Appears calm and comfortable ENMT:  . grossly normal hearing  Respiratory:  . CTA bilaterally, no w/r/r.  . Respiratory effort normal. No retractions or accessory muscle use Cardiovascular:  . RRR, no m/r/g . 1+ LE extremity edema   Psychiatric:  . judgement and insight appear normal . Mental status o Mood, affect appropriate  I have personally reviewed following labs and imaging studies:  BMP unremarkable  Troponins flat  Scheduled Meds: . apixaban  2.5 mg Oral BID  . aspirin EC  81 mg Oral Daily  . cycloSPORINE  1 drop Both Eyes BID  . diltiazem  180 mg Oral Daily  . feeding supplement (ENSURE ENLIVE)  237 mL Oral BID BM  . lisinopril  2.5 mg Oral Daily  . mirtazapine  15 mg Oral QHS  . potassium chloride SA  20 mEq Oral BID  . sodium chloride flush  3 mL Intravenous Q12H  Continuous Infusions:   Principal Problem:   Acute respiratory failure (HCC) Active Problems:   Essential hypertension   PACEMAKER, PERMANENT   Atrial fibrillation (HCC)   CHF exacerbation (HCC)   Diastolic CHF, acute on chronic (HCC)   Humerus fracture   Hx of right BKA (HCC)   Malnutrition of moderate degree   LOS: 1 day   Time spent 20 minutes

## 2016-06-10 NOTE — Progress Notes (Signed)
Pt foley removed at 10am. As of 1530 pt has not voided. Bladder scan performed showing 120 ml in bladder.  Will continue to monitor. Faren Florence A

## 2016-06-10 NOTE — Clinical Social Work Placement (Signed)
   CLINICAL SOCIAL WORK PLACEMENT  NOTE  Date:  06/10/2016  Patient Details  Name: [name] MRN: [id number] Date of Birth: 03-Sep-1931  Clinical Social Work is seeking post-discharge placement for this patient at the [place] level of care (*CSW will initial, date and re-position this form in  chart as items are completed):  Yes   Patient/family provided with [place] Work Department's list of facilities offering this level of care within the geographic area requested by the patient (or if unable, by the patient's family).  Yes   Patient/family informed of their freedom to choose among providers that offer the needed level of care, that participate in Medicare, Medicaid or managed care program needed by the patient, have an available bed and are willing to accept the patient.  Yes   Patient/family informed of [place]'s ownership interest in [facility] and [facility], as well as of the fact that they are under no obligation to receive care at these facilities.  PASRR submitted to EDS on       PASRR number received on       Existing PASRR number confirmed on 06/10/16     FL2 transmitted to all facilities in geographic area requested by pt/family on 06/10/16     FL2 transmitted to all facilities within larger geographic area on       Patient informed that his/her managed care company has contracts with or will negotiate with certain facilities, including the following:            Patient/family informed of bed offers received.  Patient chooses bed at       Physician recommends and patient chooses bed at      Patient to be transferred to   on  .  Patient to be transferred to facility by       Patient family notified on   of transfer.  Name of family member notified:        PHYSICIAN       Additional Comment:    _______________________________________________ Weston Anna, LCSW 06/10/2016, 12:26 PM

## 2016-06-10 NOTE — NC FL2 (Signed)
[place] LEVEL OF CARE SCREENING TOOL     IDENTIFICATION  Patient Name: [name] Birthdate: 10/26/1931 Sex: female Admission Date (Current Location): 06/08/2016  [facility] and [place] Number:  Herbalist and Address:  [facility],  [address], [place]      Provider Number: [id number]  Attending Physician Name and Address:  Samuella Cota, MD  Relative Name and Phone Number:       Current Level of Care: Hospital Recommended Level of Care: [place] Prior Approval Number:    Date Approved/Denied:   PASRR Number: [id number]  Discharge Plan: Home    Current Diagnoses: Patient Active Problem List   Diagnosis Date Noted  . Humerus fracture 06/09/2016  . Hx of right BKA ([place]) 06/09/2016  . Acute respiratory failure ([place]) 06/09/2016  . Malnutrition of moderate degree 06/09/2016  . CHF exacerbation ([place]) 05/18/2015  . Diastolic CHF, acute on chronic (HCC) 05/18/2015  . Unilateral AKA ([place]) 09/23/2014  . Atrial fibrillation ([place]) 07/03/2013  . Breast cancer, left breast ([place]) 04/24/2013  . Other symptoms involving cardiovascular system 12/22/2010  . PACEMAKER, PERMANENT 06/21/2010  . MITRAL REGURGITATION 06/17/2010  . Essential hypertension 06/17/2010  . HOCM (hypertrophic obstructive cardiomyopathy) ([place]) 06/17/2010  . HEART BLOCK 06/17/2010  . HEMORRHOIDS 06/17/2010  . BRONCHITIS 06/17/2010  . ASTHMA 06/17/2010  . CYSTITIS 06/17/2010  . URINARY INCONTINENCE 06/17/2010    Orientation RESPIRATION BLADDER Height & Weight     Self, Time, Situation, Place  Normal Continent Weight: 119 lb 7.8 oz (54.2 kg) Height:  5\' 2"  (157.5 cm)  BEHAVIORAL SYMPTOMS/MOOD NEUROLOGICAL BOWEL NUTRITION STATUS      Continent Diet (Heart)  AMBULATORY STATUS COMMUNICATION OF NEEDS Skin   Extensive Assist Verbally Normal                       Personal Care Assistance Level of Assistance  Bathing, Dressing  Bathing Assistance: Limited assistance   Dressing Assistance: Limited assistance     Functional Limitations Info             SPECIAL CARE FACTORS FREQUENCY  PT (By licensed PT), OT (By licensed OT)     PT Frequency: 5 OT Frequency: 5            Contractures      Additional Factors Info  Code Status, Allergies Code Status Info: Fullcode Allergies Info: Allergies:  Ceftaroline, Codeine, Morphine, Sulfa Antibiotics           Current Medications (06/10/2016):  This is the current hospital active medication list Current Facility-Administered Medications  Medication Dose Route Frequency Provider Last Rate Last Dose  . 0.9 %  sodium chloride infusion  250 mL Intravenous PRN Karmen Bongo, MD      . acetaminophen (TYLENOL) tablet 650 mg  650 mg Oral Q6H PRN Samuella Cota, MD   650 mg at 06/10/16 0742  . albuterol (PROVENTIL) (2.5 MG/3ML) 0.083% nebulizer solution 2.5 mg  2.5 mg Nebulization Q6H PRN Karmen Bongo, MD      . apixaban Arne Cleveland) tablet 2.5 mg  2.5 mg Oral BID Karmen Bongo, MD   2.5 mg at 06/10/16 0743  . aspirin EC tablet 81 mg  81 mg Oral Daily Karmen Bongo, MD   81 mg at 06/10/16 0743  . cyclobenzaprine (FLEXERIL) tablet 5 mg  5 mg Oral Daily PRN Karmen Bongo, MD   5 mg at 06/09/16 0349  . cycloSPORINE (  RESTASIS) 0.05 % ophthalmic emulsion 1 drop  1 drop Both Eyes BID Karmen Bongo, MD   1 drop at 06/10/16 0955  . diltiazem (CARDIZEM CD) 24 hr capsule 180 mg  180 mg Oral Daily Karmen Bongo, MD   180 mg at 06/10/16 0742  . feeding supplement (ENSURE ENLIVE) (ENSURE ENLIVE) liquid 237 mL  237 mL Oral BID BM Karmen Bongo, MD   237 mL at 06/10/16 0954  . fluticasone (FLONASE) 50 MCG/ACT nasal spray 2 spray  2 spray Each Nare BID PRN Karmen Bongo, MD      . lisinopril (PRINIVIL,ZESTRIL) tablet 2.5 mg  2.5 mg Oral Daily Karmen Bongo, MD   2.5 mg at 06/10/16 0954  . mirtazapine (REMERON) tablet 15 mg  15 mg Oral QHS Karmen Bongo, MD   15 mg at  06/09/16 2224  . ondansetron (ZOFRAN) injection 4 mg  4 mg Intravenous Q6H PRN Karmen Bongo, MD      . oxyCODONE (Oxy IR/ROXICODONE) immediate release tablet 10 mg  10 mg Oral Q4H PRN Karmen Bongo, MD   10 mg at 06/09/16 0701  . oxyCODONE-acetaminophen (PERCOCET/ROXICET) 5-325 MG per tablet 1 tablet  1 tablet Oral Q4H PRN Karmen Bongo, MD   1 tablet at 06/09/16 1054  . potassium chloride SA (K-DUR,KLOR-CON) CR tablet 20 mEq  20 mEq Oral BID Karmen Bongo, MD   20 mEq at 06/10/16 0742  . sodium chloride flush (NS) 0.9 % injection 3 mL  3 mL Intravenous Q12H Karmen Bongo, MD   3 mL at 06/10/16 0957  . sodium chloride flush (NS) 0.9 % injection 3 mL  3 mL Intravenous PRN Karmen Bongo, MD      . traMADol Veatrice Bourbon) tablet 50 mg  50 mg Oral Q6H PRN Karmen Bongo, MD         Discharge Medications: Please see discharge summary for a list of discharge medications.  Relevant Imaging Results:  Relevant Lab Results:   Additional Information SSN: [id number]  Standley Brooking, LCSW

## 2016-06-10 NOTE — Clinical Social Work Note (Signed)
Clinical Social Work Assessment  Patient Details  Name: [name] MRN: [id number] Date of Birth: 1931-04-03  Date of referral:  06/10/16               Reason for consult:  Discharge Planning                Permission sought to share information with:  Facility Art therapist granted to share information::  Yes, Verbal Permission Granted  Name::        Agency::     Relationship::     Contact Information:     Housing/Transportation Living arrangements for the past 2 months:  Single Family Home Source of Information:  Patient Patient Interpreter Needed:  None Criminal Activity/Legal Involvement Pertinent to Current Situation/Hospitalization:    Significant Relationships:  Spouse Lives with:  Spouse Do you feel safe going back to the place where you live?    Need for family participation in patient care:  Yes (Comment)  Care giving concerns:  CSW received consult to speak with pt regarding discharge plans.   Social Worker assessment / plan:  CSW spoke with pt at bedside regarding her discharge plans. Pt spoke with her husband/family members and agrees that SNF would be appropriate. Pt prefers [facility]. CSW sent out referral and received a bed offer at [facility]. CSW is currently waiting for pt to confirm bed offer.   Employment status:  Retired Forensic scientist:  Medicare PT Recommendations:  [place] / Referral to community resources:  [place]  Patient/Family's Response to care:  Pt was appreciative of CSW help towards discharging planning.    Patient/Family's Understanding of and Emotional Response to Diagnosis, Current Treatment, and Prognosis:  Pt seemed to understand that she would benefit from short-term rehab. Pt did not have any questions regarding the process and wanted to speak with her husband about her options.   Emotional Assessment Appearance:  Appears stated  age Attitude/Demeanor/Rapport:    Affect (typically observed):  Accepting, Calm, Appropriate Orientation:  Oriented to Self, Oriented to Place, Oriented to  Time, Oriented to Situation Alcohol / Substance use:    Psych involvement (Current and /or in the community):  No (Comment)  Discharge Needs  Concerns to be addressed:  Discharge Planning Concerns Readmission within the last 30 days:  No Current discharge risk:  None Barriers to Discharge:  No Barriers Identified   Weston Anna, LCSW 06/10/2016, 12:16 PM

## 2016-06-10 NOTE — Evaluation (Signed)
Occupational Therapy Evaluation Patient Details Name: [name] MRN: [id number] DOB: [birth year] Today's Date: 06/10/2016    History of Present Illness 82 year old female recently stopped taking Lasix because of difficulty getting to the bathroom with history of R BKA and recent R humeral fx. Admitted for acute respiratory failure, suspected CHF exacerbation in context of discontinuation of Lasix   Clinical Impression   Pt was admitted for the above.  She will benefit from continued OT to increase safety and independence with adls.  Pt recently needed more assistance for adls since R humerus fx; had been mod I prior to this.  Pt currently needs up to max A for LB adls and min A for lateral transfers. Goals in acute are for min guard to min A    Follow Up Recommendations  SNF    Equipment Recommendations  3 in 1 bedside comode    Recommendations for Other Services       Precautions / Restrictions Precautions Precautions: Fall Precaution Comments: R BKA, did not have prosthesis in room Required Braces or Orthoses: Sling (immobilizer) Restrictions Weight Bearing Restrictions: Yes RUE Weight Bearing: Non weight bearing RLE Weight Bearing: Non weight bearing      Mobility Bed Mobility Overal bed mobility: Needs Assistance Bed Mobility: Supine to Sit      Sit to supine: Min guard   General bed mobility comments: for safety  Transfers Overall transfer level: Needs assistance Equipment used: None Transfers: Lateral/Scoot Transfers     Squat pivot transfers: Min assist     General transfer comment: steadying assistance; guarding    Balance Overall balance assessment: Needs assistance Sitting-balance support: Single extremity supported;Feet supported Sitting balance-Leahy Scale: Fair       Standing balance comment: standing not tested with OT                            ADL Overall ADL's : Needs assistance/impaired     Grooming: Set  up;Sitting   Upper Body Bathing: Moderate assistance;Sitting   Lower Body Bathing: Minimal assistance;Sitting/lateral leans   Upper Body Dressing : Maximal assistance;Sitting   Lower Body Dressing: Maximal assistance;Sitting/lateral leans   Toilet Transfer: Minimal assistance (lateral transfer to bed; drop arm chair)   Toileting- Clothing Manipulation and Hygiene: Moderate assistance;Sitting/lateral lean         General ADL Comments: pt's husband brought sling in.  Pt states she was instructed to wear it, but it was taken off at some point and he had taken it home.  Applied sling and adjusted for comfort.  Educated on pillow under upper arm for support when lying flat.  Pt states that she has been leaning to adjust clothing; she bumped RUE in the past when doing this.  Pt hasn't been wearing prosthesis since R humerus fx:  She has been functioning from w/c level     Vision     Perception     Praxis      Pertinent Vitals/Pain Pain Assessment: Faces Faces Pain Scale: Hurts a little bit Pain Location: R shoulder Pain Descriptors / Indicators: Sore Pain Intervention(s): Limited activity within patient's tolerance;Monitored during session     Hand Dominance     Extremity/Trunk Assessment Upper Extremity Assessment Upper Extremity Assessment: RUE deficits/detail (immobilized; fingers/wrist wfls)     RLE BKA; not using prosthesis at this time     Communication Communication Communication: No difficulties   Cognition Arousal/Alertness: Awake/alert Behavior During Therapy: WFL for tasks  assessed/performed Overall Cognitive Status: Within Functional Limits for tasks assessed                     General Comments       Exercises       Shoulder Instructions      Home Living Family/patient expects to be discharged to:: Skilled nursing facility                         Home Equipment: Gilford Rile - 2 wheels;Wheelchair - manual          Prior  Functioning/Environment Level of Independence: Needs assistance        Comments: has needed help with adls since shoulder fx.  Pt is L handed    OT Diagnosis: Acute pain   OT Problem List: Decreased strength;Decreased activity tolerance;Impaired balance (sitting and/or standing);Decreased knowledge of use of DME or AE;Pain   OT Treatment/Interventions: Self-care/ADL training;DME and/or AE instruction;Patient/family education;Balance training;Therapeutic activities    OT Goals(Current goals can be found in the care plan section) Acute Rehab OT Goals Patient Stated Goal: get arm healed OT Goal Formulation: With patient Time For Goal Achievement: 06/17/16 Potential to Achieve Goals: Good ADL Goals Pt Will Perform Lower Body Bathing: with min assist;sitting/lateral leans;with adaptive equipment Pt Will Perform Lower Body Dressing: with min assist;sitting/lateral leans;with adaptive equipment (pants only) Pt Will Transfer to Toilet: with min guard assist;bedside commode (lateral) Pt Will Perform Toileting - Clothing Manipulation and hygiene: with min guard assist;sitting/lateral leans  OT Frequency: Min 2X/week   Barriers to D/C:            Co-evaluation              End of Session    Activity Tolerance: Patient tolerated treatment well Patient left: in bed;with call bell/phone within reach;with bed alarm set;with family/visitor present   Time: 1320-1340 OT Time Calculation (min): 20 min Charges:  OT General Charges $OT Visit: 1 Procedure OT Evaluation $OT Eval Low Complexity: 1 Procedure G-Codes:    Tinisha Etzkorn 06/26/16, 2:30 PM Lesle Chris, OTR/L [phone] 06-26-16

## 2016-06-10 NOTE — Evaluation (Signed)
Physical Therapy Evaluation Patient Details Name: [name] MRN: [id number] DOB: 1931/11/20 Today's Date: 06/10/2016   History of Present Illness  82 year old female recently stopped taking Lasix because of difficulty getting to the bathroom with history of R BKA and recent R humeral fx. Admitted for acute respiratory failure, suspected CHF exacerbation in context of discontinuation of Lasix  Clinical Impression  Pt admitted with above diagnosis. Pt currently with functional limitations due to the deficits listed below (see PT Problem List).  Pt will benefit from skilled PT to increase their independence and safety with mobility to allow discharge to the venue listed below.  Pt with R nonsurgical humeral fx earlier this month and has been managing poorly at home.  Pt did not have prosthesis in room today however assisted OOB to recliner.  Pt reports R UE NWB and has been wearing sling at home.  Sling was not in room so requested RN to obtain new one.  Pt would benefit from ST-SNF prior to d/c back home.     Follow Up Recommendations SNF;Supervision/Assistance - 24 hour    Equipment Recommendations  None recommended by PT    Recommendations for Other Services       Precautions / Restrictions Precautions Precautions: Fall Precaution Comments: R BKA, did not have prosthesis in room Required Braces or Orthoses: Sling Restrictions Weight Bearing Restrictions: Yes RLE Weight Bearing: Non weight bearing      Mobility  Bed Mobility Overal bed mobility: Needs Assistance Bed Mobility: Supine to Sit     Supine to sit: Min guard;HOB elevated        Transfers Overall transfer level: Needs assistance Equipment used: None Transfers: Squat Pivot Transfers     Squat pivot transfers: Min assist     General transfer comment: pt held armrests for support with L LE, PT supported Right UE (no sling in room, RN notified), pt transferred to left side  Ambulation/Gait Ambulation/Gait  assistance:  (deferred due to no prosthesis and NWB R UE)              Stairs            Wheelchair Mobility    Modified Rankin (Stroke Patients Only)       Balance Overall balance assessment: Needs assistance Sitting-balance support: Single extremity supported;Feet supported Sitting balance-Leahy Scale: Fair     Standing balance support: Single extremity supported Standing balance-Leahy Scale: Poor Standing balance comment: requiring UE assist and steadying assist in standing                             Pertinent Vitals/Pain Pain Assessment: No/denies pain    Home Living Family/patient expects to be discharged to:: Private residence Living Arrangements: Spouse/significant other;Children   Type of Home: House Home Access: Stairs to enter     Home Layout: One level Home Equipment: Environmental consultant - 2 wheels;Wheelchair - manual      Prior Function Level of Independence: Independent with assistive device(s)         Comments: typically ambulatory with RW however since humeral fx has been using w/c     Hand Dominance        Extremity/Trunk Assessment               Lower Extremity Assessment: RLE deficits/detail;Generalized weakness RLE Deficits / Details: hx of R BKA       Communication   Communication: No difficulties  Cognition Arousal/Alertness: Awake/alert Behavior During  Therapy: WFL for tasks assessed/performed Overall Cognitive Status: Within Functional Limits for tasks assessed                      General Comments      Exercises        Assessment/Plan    PT Assessment Patient needs continued PT services  PT Diagnosis Generalized weakness   PT Problem List Decreased strength;Decreased activity tolerance;Decreased mobility  PT Treatment Interventions DME instruction;Gait training;Functional mobility training;Patient/family education;Therapeutic activities;Wheelchair mobility training;Therapeutic  exercise;Balance training   PT Goals (Current goals can be found in the Care Plan section) Acute Rehab PT Goals PT Goal Formulation: With patient Time For Goal Achievement: 06/17/16 Potential to Achieve Goals: Good    Frequency Min 3X/week   Barriers to discharge        Co-evaluation               End of Session   Activity Tolerance: Patient tolerated treatment well Patient left: in chair;with call bell/phone within reach;with chair alarm set Nurse Communication: Mobility status;Precautions         Time: [id number] PT Time Calculation (min) (ACUTE ONLY): 12 min   Charges:   PT Evaluation $PT Eval Low Complexity: 1 Procedure     PT G Codes:        Kinsey Cowsert,KATHrine E 06/10/2016, 12:36 PM Carmelia Bake, PT, DPT 06/10/2016 Pager: [phone]

## 2016-06-10 NOTE — Clinical Social Work Placement (Signed)
   CLINICAL SOCIAL WORK PLACEMENT  NOTE  Date:  06/10/2016  Patient Details  Name: [name] MRN: [id number] Date of Birth: 1931/01/30  Clinical Social Work is seeking post-discharge placement for this patient at the [place] level of care (*CSW will initial, date and re-position this form in  chart as items are completed):  Yes   Patient/family provided with [facility] Work Department's list of facilities offering this level of care within the geographic area requested by the patient (or if unable, by the patient's family).  Yes   Patient/family informed of their freedom to choose among providers that offer the needed level of care, that participate in Medicare, Medicaid or managed care program needed by the patient, have an available bed and are willing to accept the patient.  Yes   Patient/family informed of [facility]'s ownership interest in [facility] and [facility], as well as of the fact that they are under no obligation to receive care at these facilities.  PASRR submitted to EDS on       PASRR number received on       Existing PASRR number confirmed on 06/10/16     FL2 transmitted to all facilities in geographic area requested by pt/family on 06/10/16     FL2 transmitted to all facilities within larger geographic area on       Patient informed that his/her managed care company has contracts with or will negotiate with certain facilities, including the following:        Yes   Patient/family informed of bed offers received.  Patient chooses bed at [facility], [place]     Physician recommends and patient chooses bed at      Patient to be transferred to   on  .  Patient to be transferred to facility by       Patient family notified on   of transfer.  Name of family member notified:        PHYSICIAN       Additional Comment:    _______________________________________________ Weston Anna, LCSW 06/10/2016,  2:38 PM

## 2016-06-11 LAB — BASIC METABOLIC PANEL
Anion gap: 9 (ref 5–15)
BUN: 34 mg/dL — ABNORMAL HIGH (ref 6–20)
CO2: 28 mmol/L (ref 22–32)
Calcium: 9.3 mg/dL (ref 8.9–10.3)
Chloride: 105 mmol/L (ref 101–111)
Creatinine, Ser: 1.23 mg/dL — ABNORMAL HIGH (ref 0.44–1.00)
GFR calc Af Amer: 46 mL/min — ABNORMAL LOW (ref >60)
GFR calc non Af Amer: 40 mL/min — ABNORMAL LOW (ref >60)
Glucose, Bld: 99 mg/dL (ref 65–99)
Potassium: 4.7 mmol/L (ref 3.5–5.1)
Sodium: 142 mmol/L (ref 135–145)

## 2016-06-11 MED ORDER — LISINOPRIL 2.5 MG PO TABS: 2.5000 mg | ORAL_TABLET | Freq: Every day | ORAL | Status: DC

## 2016-06-11 MED ORDER — FUROSEMIDE 40 MG PO TABS
40.0000 mg | ORAL_TABLET | Freq: Every day | ORAL | Status: DC
  Administered 2016-06-11: 40 mg via ORAL
  Filled 2016-06-11: qty 1

## 2016-06-11 NOTE — Progress Notes (Addendum)
PROGRESS NOTE  [name] [id number] DOB: 12-27-1930 DOA: 06/08/2016 PCP: Jani Gravel, MD  Brief Narrative:  82 year old woman with history of CHF presented with cough, shortness of breath. Recently stopped taking Lasix because of difficulty getting to the bathroom with history of amputation of leg and recent broken arm. Admitted for acute respiratory failure, suspected CHF exacerbation in context of discontinuation of Lasix. Condition gradually improving with diuresis. Seen by physical and occupational therapy with recommendation for skilled nursing facility placement.  Assessment/Plan:   1. Acute diastolic congestive heart failure exacerbation, EF 60% this admission on echogram. Improved after diuresis, close to baseline, - 5 L since admission. Initial chest x-ray with an opacity have no features to suggest pneumonia. No leukocytosis or fever. No treatment indicated. [facility] out patient two-view chest x-ray one week after discharge by PCP.  2. Acute respiratory failure with hypoxia secondary to CHF, resolved.  3. Demand ischemia secondary to acute CHF. EKG showed paced rhythm. Troponins flat. No evidence of ACS.  4. Chronic atrial fibrillation, [place] vasc 2 score of at least 3. continue anticoagulation with Eliquis. Also on aspirin. Will defer discontinuation of splint to PCP and primary                cardiologist. Continue Cardizem.  5. Recent humeral fracture, followed as an outpatient by orthopedics.  6. Monitor malnutrition  7. PMH hypertrophic obstructive cardiomyopathy, status post septal myotomy; complete heart block, status post pacemaker placement. Stable no acute issues.  8.         Mild ARF. Hold ACE inhibitor, discontinue further diuretics, repeat BMP in the morning.    Slowly improving. Continue IV diuresis, check BMP in the morning.  Appreciate physical and occupational therapy evaluations. Agree with short-term rehabilitation.  Anticipate transfer skilled nursing  facility on Monday.  DVT prophylaxis: apixaban Code Status: full code Family Communication: none Disposition Plan: SNF  Signature  Thurnell Lose M.D on 06/11/2016 at 11:31 AM  Between 7am to 7pm - Pager - [phone], After 7pm go to [email or web address] - password [email or web address]  - Office  [phone]  06/11/2016, 11:31 AM  LOS: 2 days   Consultants:    Procedures  TTE  - Left ventricle: Small ventrical apex not well seen possible non obstructive HOCM. No SAM or LVOT gradient. The cavity size was normal. Wall thickness was increased in a pattern of severe LVH. Systolic function was normal. The estimated ejection fraction was in the range of 50% to 55%. - Aortic valve: There was trivial regurgitation. - Left atrium: The atrium was severely dilated. - Atrial septum: No defect or patent foramen ovale was identified. - Pulmonary arteries: PA peak pressure: 66 mm Hg (S).   Antimicrobials:    HPI/Subjective:  Patient embedded, shortness of breath much improved, denies any headache chest or abdominal pain.  Objective: Filed Vitals:   06/10/16 0534 06/10/16 1401 06/10/16 2125 06/11/16 0509  BP: 118/58 117/57 120/63 111/57  Pulse: 60 65 70 61  Temp: 97.8 F (36.6 C) 97.9 F (36.6 C) 98 F (36.7 C) 98 F (36.7 C)  TempSrc: Oral Oral Oral Oral  Resp: 18 18 18 18   Height:      Weight: 54.2 kg (119 lb 7.8 oz)   53.1 kg (117 lb 1 oz)  SpO2: 96% 95% 95%     Intake/Output Summary (Last 24 hours) at 06/11/16 1131 Last data filed at 06/11/16 0900  Gross per 24 hour  Intake    720 ml  Output   1525 ml  Net   -805 ml     Filed Weights   06/09/16 0113 06/10/16 0534 06/11/16 0509  Weight: 56.9 kg (125 lb 7.1 oz) 54.2 kg (119 lb 7.8 oz) 53.1 kg (117 lb 1 oz)    Exam:    Constitutional:  . Appears calm and comfortable ENMT:  . grossly normal hearing  Respiratory:  . CTA bilaterally, no w/r/r.  . Respiratory effort normal. No retractions or accessory  muscle use Cardiovascular:  . RRR, no m/r/g . 1+ LE extremity edema < L BKA, L shoulder in a sling Psychiatric:  . judgement and insight appear normal . Mental status o Mood, affect appropriate  I have personally reviewed following labs and imaging studies:  BMP unremarkable  Troponins flat  Scheduled Meds: . apixaban  2.5 mg Oral BID  . aspirin EC  81 mg Oral Daily  . cycloSPORINE  1 drop Both Eyes BID  . diltiazem  180 mg Oral Daily  . feeding supplement (ENSURE ENLIVE)  237 mL Oral BID BM  . mirtazapine  15 mg Oral QHS  . sodium chloride flush  3 mL Intravenous Q12H   Continuous Infusions:   Principal Problem:   Acute respiratory failure (HCC) Active Problems:   Essential hypertension   PACEMAKER, PERMANENT   Atrial fibrillation (HCC)   CHF exacerbation (HCC)   Diastolic CHF, acute on chronic (HCC)   Humerus fracture   Hx of right BKA (HCC)   Malnutrition of moderate degree   LOS: 2 days   Time spent 20 minutes

## 2016-06-12 LAB — BASIC METABOLIC PANEL
Anion gap: 13 (ref 5–15)
BUN: 38 mg/dL — ABNORMAL HIGH (ref 6–20)
CO2: 25 mmol/L (ref 22–32)
Calcium: 9.2 mg/dL (ref 8.9–10.3)
Chloride: 100 mmol/L — ABNORMAL LOW (ref 101–111)
Creatinine, Ser: 1.33 mg/dL — ABNORMAL HIGH (ref 0.44–1.00)
GFR calc Af Amer: 42 mL/min — ABNORMAL LOW (ref >60)
GFR calc non Af Amer: 36 mL/min — ABNORMAL LOW (ref >60)
Glucose, Bld: 112 mg/dL — ABNORMAL HIGH (ref 65–99)
Potassium: 4.7 mmol/L (ref 3.5–5.1)
Sodium: 138 mmol/L (ref 135–145)

## 2016-06-12 MED ORDER — SODIUM CHLORIDE 0.9 % IV SOLN
INTRAVENOUS | Status: DC
  Administered 2016-06-12: 08:00:00 via INTRAVENOUS

## 2016-06-12 MED ORDER — GUAIFENESIN-DM 100-10 MG/5ML PO SYRP
10.0000 mL | ORAL_SOLUTION | ORAL | Status: DC | PRN
  Administered 2016-06-12 – 2016-06-13 (×3): 10 mL via ORAL
  Filled 2016-06-12 (×4): qty 10

## 2016-06-12 MED ORDER — SODIUM CHLORIDE 0.9 % IV SOLN: INTRAVENOUS | Status: AC

## 2016-06-12 NOTE — Progress Notes (Signed)
Pt had not urinated for 9 hours.   Got pt up to bsc and she become dizzy, diaphoretic.  Was able to void 200cc's of clear amber urine.  Back to bed and bp 103/51, HR 72, ox sat on RA is 92%, RR 20, Temp 97.3.  Back to bed and dizziness resolved.  Weight is exact same as yesterday.  Pt is following fluid restriction.  Will continue to0 monitor

## 2016-06-12 NOTE — Progress Notes (Addendum)
PROGRESS NOTE  [name] [id number] DOB: 06/04/1931 DOA: 06/08/2016 PCP: Jani Gravel, MD  Brief Narrative:  82 year old woman with history of CHF presented with cough, shortness of breath. Recently stopped taking Lasix because of difficulty getting to the bathroom with history of amputation of leg and recent broken arm. Admitted for acute respiratory failure, suspected CHF exacerbation in context of discontinuation of Lasix. Condition gradually improving with diuresis. Seen by physical and occupational therapy with recommendation for skilled nursing facility placement.  Assessment/Plan:   1. Acute on Chronic Diastolic congestive heart failure EF 60% this admission on echogram. Improved after diuresis, close to baseline, - 5,065 cc since admission. Initial chest x-ray with an opacity have no features to suggest       pneumonia. No leukocytosis or fever. No treatment indicated. Repeat outpatient two-view chest x-ray one week after discharge by PCP.  2. Acute respiratory failure with hypoxia secondary to CHF -  resolved.  3. Demand ischemia secondary to acute CHF. EKG showed paced rhythm. Troponins flat. No evidence of ACS.  4. Chronic atrial fibrillation, [place] vasc 2 score of at least 3. continue anticoagulation with Eliquis & ASA. Will defer discontinuation of splint to PCP and primary cardiologist. Continue Cardizem.  5. Recent R. Humeral fracture, followed as an outpatient by orthopedics. Arm in sling.  6. Monitor malnutrition  7. PMH hypertrophic obstructive cardiomyopathy, status post septal myotomy; complete heart block, status post pacemaker placement. Stable no acute issues.  8.         Mild ARF. Hold ACE inhibitor, discontinued further diuretics, gentle IVF x 5 hrs, repeat BMP in the morning.    Slowly improving. Continue IV diuresis, check BMP in the morning.  Appreciate physical and occupational therapy evaluations. Agree with short-term rehabilitation.  Anticipate  transfer skilled nursing facility on Monday.  DVT prophylaxis: apixaban Code Status: full code Family Communication: none Disposition Plan: SNF  Signature  Lala Lund K M.D on 06/12/2016 at 9:09 AM  Between 7am to 7pm - Pager - [phone], After 7pm go to [email or web address] - password [email or web address]  - Office  [phone]  06/12/2016, 9:09 AM  LOS: 3 days   Consultants:    Procedures  TTE  - Left ventricle: Small ventrical apex not well seen possible non obstructive HOCM. No SAM or LVOT gradient. The cavity size was normal. Wall thickness was increased in a pattern of severe LVH. Systolic function was normal. The estimated ejection fraction was in the range of 50% to 55%. - Aortic valve: There was trivial regurgitation. - Left atrium: The atrium was severely dilated. - Atrial septum: No defect or patent foramen ovale was identified. - Pulmonary arteries: PA peak pressure: 66 mm Hg (S).   Antimicrobials:    HPI/Subjective:  Patient embedded, shortness of breath much improved, denies any headache chest or abdominal pain.  Objective: Filed Vitals:   06/11/16 0509 06/11/16 1355 06/11/16 2039 06/12/16 0420  BP: 111/57 113/42 123/56 103/51  Pulse: 61 66 65   Temp: 98 F (36.7 C) 97.7 F (36.5 C) 97.9 F (36.6 C) 97.3 F (36.3 C)  TempSrc: Oral Oral Oral Oral  Resp: 18 18 22 20   Height:      Weight: 53.1 kg (117 lb 1 oz)   53.1 kg (117 lb 1 oz)  SpO2:  95% 95% 93%    Intake/Output Summary (Last 24 hours) at 06/12/16 0909 Last data filed at 06/12/16 0700  Gross per 24 hour  Intake   1460 ml  Output   1400 ml  Net     60 ml     Filed Weights   06/10/16 0534 06/11/16 0509 06/12/16 0420  Weight: 54.2 kg (119 lb 7.8 oz) 53.1 kg (117 lb 1 oz) 53.1 kg (117 lb 1 oz)    Exam:    Constitutional:  . Appears calm and comfortable ENMT:  . grossly normal hearing  Respiratory:  . CTA bilaterally, no w/r/r.  . Respiratory effort normal. No  retractions or accessory muscle use Cardiovascular:  . RRR, no m/r/g . 1+ LE extremity edema < R BKA, R shoulder in a sling Psychiatric:  . judgement and insight appear normal . Mental status o Mood, affect appropriate  I have personally reviewed following labs and imaging studies:  BMP unremarkable  Troponins flat  Scheduled Meds: . apixaban  2.5 mg Oral BID  . aspirin EC  81 mg Oral Daily  . cycloSPORINE  1 drop Both Eyes BID  . diltiazem  180 mg Oral Daily  . feeding supplement (ENSURE ENLIVE)  237 mL Oral BID BM  . mirtazapine  15 mg Oral QHS   Continuous Infusions: . sodium chloride      Principal Problem:   Acute respiratory failure (HCC) Active Problems:   Essential hypertension   PACEMAKER, PERMANENT   Atrial fibrillation (HCC)   CHF exacerbation (HCC)   Diastolic CHF, acute on chronic (HCC)   Humerus fracture   Hx of right BKA (HCC)   Malnutrition of moderate degree   LOS: 3 days   Time spent 20 minutes

## 2016-06-13 DIAGNOSIS — I509 Heart failure, unspecified: Secondary | ICD-10-CM | POA: Diagnosis not present

## 2016-06-13 DIAGNOSIS — S42301D Unspecified fracture of shaft of humerus, right arm, subsequent encounter for fracture with routine healing: Secondary | ICD-10-CM | POA: Diagnosis not present

## 2016-06-13 DIAGNOSIS — R29898 Other symptoms and signs involving the musculoskeletal system: Secondary | ICD-10-CM | POA: Diagnosis not present

## 2016-06-13 DIAGNOSIS — Z950 Presence of cardiac pacemaker: Secondary | ICD-10-CM | POA: Diagnosis not present

## 2016-06-13 DIAGNOSIS — I482 Chronic atrial fibrillation: Secondary | ICD-10-CM | POA: Diagnosis not present

## 2016-06-13 DIAGNOSIS — I272 Other secondary pulmonary hypertension: Secondary | ICD-10-CM | POA: Diagnosis not present

## 2016-06-13 DIAGNOSIS — M6281 Muscle weakness (generalized): Secondary | ICD-10-CM | POA: Diagnosis not present

## 2016-06-13 DIAGNOSIS — R5381 Other malaise: Secondary | ICD-10-CM | POA: Diagnosis not present

## 2016-06-13 DIAGNOSIS — S42201A Unspecified fracture of upper end of right humerus, initial encounter for closed fracture: Secondary | ICD-10-CM | POA: Diagnosis not present

## 2016-06-13 DIAGNOSIS — R41841 Cognitive communication deficit: Secondary | ICD-10-CM | POA: Diagnosis not present

## 2016-06-13 DIAGNOSIS — I421 Obstructive hypertrophic cardiomyopathy: Secondary | ICD-10-CM | POA: Diagnosis not present

## 2016-06-13 DIAGNOSIS — Z853 Personal history of malignant neoplasm of breast: Secondary | ICD-10-CM | POA: Diagnosis not present

## 2016-06-13 DIAGNOSIS — G8918 Other acute postprocedural pain: Secondary | ICD-10-CM | POA: Diagnosis not present

## 2016-06-13 DIAGNOSIS — K5909 Other constipation: Secondary | ICD-10-CM | POA: Diagnosis not present

## 2016-06-13 DIAGNOSIS — R2689 Other abnormalities of gait and mobility: Secondary | ICD-10-CM | POA: Diagnosis not present

## 2016-06-13 DIAGNOSIS — R278 Other lack of coordination: Secondary | ICD-10-CM | POA: Diagnosis not present

## 2016-06-13 DIAGNOSIS — J9600 Acute respiratory failure, unspecified whether with hypoxia or hypercapnia: Secondary | ICD-10-CM | POA: Diagnosis not present

## 2016-06-13 DIAGNOSIS — Z4789 Encounter for other orthopedic aftercare: Secondary | ICD-10-CM | POA: Diagnosis not present

## 2016-06-13 DIAGNOSIS — R05 Cough: Secondary | ICD-10-CM | POA: Diagnosis not present

## 2016-06-13 DIAGNOSIS — J9690 Respiratory failure, unspecified, unspecified whether with hypoxia or hypercapnia: Secondary | ICD-10-CM | POA: Diagnosis not present

## 2016-06-13 DIAGNOSIS — M810 Age-related osteoporosis without current pathological fracture: Secondary | ICD-10-CM | POA: Diagnosis not present

## 2016-06-13 DIAGNOSIS — I5032 Chronic diastolic (congestive) heart failure: Secondary | ICD-10-CM | POA: Diagnosis not present

## 2016-06-13 DIAGNOSIS — E440 Moderate protein-calorie malnutrition: Secondary | ICD-10-CM | POA: Diagnosis not present

## 2016-06-13 DIAGNOSIS — I10 Essential (primary) hypertension: Secondary | ICD-10-CM | POA: Diagnosis not present

## 2016-06-13 DIAGNOSIS — R2681 Unsteadiness on feet: Secondary | ICD-10-CM | POA: Diagnosis not present

## 2016-06-13 DIAGNOSIS — I480 Paroxysmal atrial fibrillation: Secondary | ICD-10-CM | POA: Diagnosis not present

## 2016-06-13 LAB — BASIC METABOLIC PANEL
Anion gap: 9 (ref 5–15)
BUN: 38 mg/dL — ABNORMAL HIGH (ref 6–20)
CO2: 27 mmol/L (ref 22–32)
Calcium: 9 mg/dL (ref 8.9–10.3)
Chloride: 101 mmol/L (ref 101–111)
Creatinine, Ser: 1.19 mg/dL — ABNORMAL HIGH (ref 0.44–1.00)
GFR calc Af Amer: 48 mL/min — ABNORMAL LOW (ref >60)
GFR calc non Af Amer: 41 mL/min — ABNORMAL LOW (ref >60)
Glucose, Bld: 106 mg/dL — ABNORMAL HIGH (ref 65–99)
Potassium: 4.4 mmol/L (ref 3.5–5.1)
Sodium: 137 mmol/L (ref 135–145)

## 2016-06-13 MED ORDER — ALBUTEROL SULFATE (2.5 MG/3ML) 0.083% IN NEBU: 2.5000 mg | INHALATION_SOLUTION | Freq: Four times a day (QID) | RESPIRATORY_TRACT | Status: DC | PRN

## 2016-06-13 MED ORDER — GUAIFENESIN-DM 100-10 MG/5ML PO SYRP: 10.0000 mL | ORAL_SOLUTION | ORAL | Status: DC | PRN

## 2016-06-13 MED ORDER — ENSURE ENLIVE PO LIQD: 237.0000 mL | Freq: Two times a day (BID) | ORAL | Status: DC

## 2016-06-13 MED ORDER — TRAMADOL HCL 50 MG PO TABS: 50.0000 mg | ORAL_TABLET | Freq: Four times a day (QID) | ORAL | Status: DC | PRN

## 2016-06-13 MED ORDER — OXYCODONE HCL 10 MG PO TABS: 10.0000 mg | ORAL_TABLET | Freq: Four times a day (QID) | ORAL | Status: DC | PRN

## 2016-06-13 NOTE — Progress Notes (Signed)
Physical Therapy Treatment Patient Details Name: [name] MRN: [id number] DOB: 01/13/1931 Today's Date: 06/13/2016    History of Present Illness 82 year old female recently stopped taking Lasix because of difficulty getting to the bathroom with history of R BKA and recent R humeral fx. Admitted for acute respiratory failure, suspected CHF exacerbation in context of discontinuation of Lasix    PT Comments    Pt assisted with transferring to [facility] and then recliner today.  Pt does not have prosthesis in room so requested she have family bring this to SNF, so she can begin ambulating with rehab.  Follow Up Recommendations  SNF;Supervision/Assistance - 24 hour     Equipment Recommendations  None recommended by PT    Recommendations for Other Services       Precautions / Restrictions Precautions Precautions: Fall Precaution Comments: R BKA, did not have prosthesis in room Restrictions Weight Bearing Restrictions: Yes RUE Weight Bearing: Non weight bearing    Mobility  Bed Mobility Overal bed mobility: Needs Assistance Bed Mobility: Supine to Sit     Supine to sit: Min guard;HOB elevated     General bed mobility comments: pt had sling on in bed, readjusted for better support with mobiltiy  Transfers Overall transfer level: Needs assistance Equipment used: None Transfers: Squat Pivot Transfers     Squat pivot transfers: Min assist     General transfer comment: pt able to transfer from bed to [facility] and then to recliner, requires assist for steadying at waist (pt unable to perform pericare however was able to perform semistanding position)  Ambulation/Gait                 Stairs            Wheelchair Mobility    Modified Rankin (Stroke Patients Only)       Balance                                    Cognition Arousal/Alertness: Awake/alert Behavior During Therapy: WFL for tasks assessed/performed Overall Cognitive Status: Within  Functional Limits for tasks assessed                      Exercises      General Comments        Pertinent Vitals/Pain Pain Assessment: Faces Faces Pain Scale: Hurts a little bit Pain Location: R shoulder Pain Descriptors / Indicators: Sore Pain Intervention(s): Limited activity within patient's tolerance;Repositioned;Monitored during session    Home Living                      Prior Function            PT Goals (current goals can now be found in the care plan section) Progress towards PT goals: Progressing toward goals    Frequency  Min 3X/week    PT Plan Current plan remains appropriate    Co-evaluation             End of Session Equipment Utilized During Treatment: Gait belt Activity Tolerance: Patient tolerated treatment well Patient left: in chair;with call bell/phone within reach;with chair alarm set     Time: [id number] PT Time Calculation (min) (ACUTE ONLY): 12 min  Charges:  $Therapeutic Activity: 8-22 mins                    G Codes:  [name],[name] 06/13/2016, 11:25 AM [name], PT, DPT 06/13/2016 Pager: [phone]

## 2016-06-13 NOTE — Discharge Summary (Addendum)
[name], is a 82 y.o. female  DOB 1931/06/21  MRN [id number].  Admission date:  06/08/2016  Admitting Physician  Karmen Bongo, MD  Discharge Date:  06/13/2016   Primary MD  Jani Gravel, MD  Recommendations for primary care physician for things to follow:   Check CBC, BMP, 2 view chest x-ray in 4-5 days.  Needs outpatient orthopedics follow-up.   Admission Diagnosis  Acute on chronic congestive heart failure, unspecified congestive heart failure type ([place]) [I50.9]   Discharge Diagnosis  Acute on chronic congestive heart failure, unspecified congestive heart failure type ([place]) [I50.9]     Principal Problem:   Acute respiratory failure (HCC) Active Problems:   Essential hypertension   PACEMAKER, PERMANENT   Atrial fibrillation (HCC)   CHF exacerbation (HCC)   Diastolic CHF, acute on chronic (HCC)   Humerus fracture   Hx of right BKA (HCC)   Malnutrition of moderate degree   Acute on chronic congestive heart failure [facility])      Past Medical History  Diagnosis Date  . Mitral regurgitation   . Hypertrophic obstructive cardiomyopathy     Required septal myotomy  . Urinary incontinence   . Cystitis   . Hemorrhoids   . Bronchitis   . Asthma   . Anemia   . Heart block     Complete heart block post surgical requiring pacer  . Hypokalemia     Postoperative, improved  . Hyponatremia     Mild postoperative, improved  . Blood transfusion   . Constipation   . Arthritis pain   . Cancer [facility])     lt breast  . Hypertension     Benign Essential Hypertension    . Current use of long term anticoagulation   . Chronic atrial fibrillation ([place])   . Arthritis   . Pacemaker   . Memory difficulty   . Humerus shaft fracture     nonsurgical, June 2017    Past Surgical History  Procedure Laterality Date    . Anterior cervical diskectomy and fusion      C-7  . Septal myotomy    . Pacemaker placement  2006/11-27-2013    MDT dual chamber pacemaker implanted 2006 with gen change by Dr Lovena Le 11-27-2013  . Replacement total knee bilateral    . Femur fracture surgery      Right  . Wrist fracture surgery      Right  . Shoulder surgery      Right  . Breast biopsy      Left, negative  . Colonoscopy    . Abdominal hysterectomy    . Cardiomyopathy    . Breast lumpectomy  2012    left breast  . Joint replacement    . Permanent pacemaker generator change N/A 11/27/2013    Procedure: PERMANENT PACEMAKER GENERATOR CHANGE;  Surgeon: Evans Lance, MD;  Location: [facility] CATH LAB;  Service: Cardiovascular;  Laterality: N/A;  . Below knee leg amputation Right        HPI  from the history and physical done on the day of admission:   82 year old woman with history of CHF presented with cough, shortness of breath. Recently stopped taking Lasix because of difficulty getting to the bathroom with history of amputation of leg and recent broken arm. Admitted for acute respiratory failure, suspected CHF exacerbation in context of discontinuation of Lasix. Condition gradually improving with diuresis. Seen by physical and occupational therapy with recommendation for skilled nursing facility placement.      Hospital Course:     1. Acute on Chronic Diastolic congestive heart failure EF 60% this admission on echogram. Improved after diuresis, now close to baseline, - 4lit since admission. Initial chest x-ray with an opacity have no features to suggest pneumonia. No leukocytosis or fever. No treatment indicated. Repeat outpatient two-view chest x-ray one week after discharge by SNF MD. Resume all dose Lasix upon discharge with 1.5 L daily fluid restriction.  2. Acute respiratory failure with hypoxia secondary to CHF - resolved.  3. Demand ischemia secondary to acute CHF. EKG showed paced rhythm. Troponins  flat. No evidence of ACS.  4. Chronic atrial fibrillation, [place] vasc 2 score of at least 3. continue anticoagulation with Eliquis, follow with primary cardiologist post discharge. Continue Cardizem.  5. Recent R. Humeral fracture, followed as an outpatient by orthopedics. Arm in sling. I'll orthopedic surgeon Dr. Betsy Coder in a week. Has old right BKA as well and walks with a prosthesis.  6. Monitor malnutrition  7. PMH hypertrophic obstructive cardiomyopathy, status post septal myotomy; complete heart block, status post pacemaker placement. Stable no acute issues. Follow with primary cardiologist Dr. Einar Gip in 1-2     weeks.    8.   Mild ARF. Hold ACE inhibitor, discontinued further diuretics, gentle IVF x 5 hrs, creatinine now much improved, resume                   home dose Lasix monitor BMP outpatient.     Follow UP  Follow-up Information    Follow up with [facility].   Specialty:  Emergency Medicine   Why:  If symptoms worsen   Contact information:   [place] [id number] [phone]      Follow up with Jani Gravel, MD. Schedule an appointment as soon as possible for a visit in 1 week.   Specialty:  Internal Medicine   Contact information:   [address] [place] [address] [phone]       Follow up with GIOFFRE,RONALD A, MD. Schedule an appointment as soon as possible for a visit in 1 week.   Specialty:  Orthopedic Surgery   Contact information:   [address] [place] [address] [phone]       Follow up with Adrian Prows, MD. Schedule an appointment as soon as possible for a visit in 1 week.   Specialty:  Cardiology   Contact information:   [address] [place] [address] [phone]        Consults obtained - None  Discharge Condition: Fair  Diet and Activity recommendation: See Discharge Instructions  below  Discharge Instructions           Discharge Instructions    Discharge instructions    Complete by:  As directed   Follow with Primary MD Jani Gravel, MD in 7 days   Get CBC, CMP, 2 view Chest X ray checked  by Primary MD  next visit.    Activity: As tolerated with Full fall precautions use walker/cane & assistance as needed   Disposition SNF     Diet:   Heart Healthy  Check your Weight same time everyday, if you gain over 2 pounds, or you develop in leg swelling, experience more shortness of breath or chest pain, call your Primary MD immediately. Follow Cardiac Low Salt Diet and 1.5 lit/day fluid restriction.   On your next visit with your primary care physician please Get Medicines reviewed and adjusted.   Please request your Prim.MD to go over all Hospital Tests and Procedure/Radiological results at the follow up, please get all Hospital records sent to your Prim MD by signing hospital release before you go home.   If you experience worsening of your admission symptoms, develop shortness of breath, life threatening emergency, suicidal or homicidal thoughts you must seek medical attention immediately by calling 911 or calling your MD immediately  if symptoms less severe.  You Must read complete instructions/literature along with all the possible adverse reactions/side effects for all the Medicines you take and that have been prescribed to you. Take any new Medicines after you have completely understood and accpet all the possible adverse reactions/side effects.   Do not drive, operate heavy machinery, perform activities at heights, swimming or participation in water activities or provide baby sitting services if your were admitted for syncope or siezures until you have seen by Primary MD or a Neurologist and advised to do so again.  Do not drive when taking Pain medications.    Do not take more than prescribed Pain, Sleep and Anxiety Medications  Special Instructions: If  you have smoked or chewed Tobacco  in the last 2 yrs please stop smoking, stop any regular Alcohol  and or any Recreational drug use.  Wear Seat belts while driving.   Please note  You were cared for by a hospitalist during your hospital stay. If you have any questions about your discharge medications or the care you received while you were in the hospital after you are discharged, you can call the unit and asked to speak with the hospitalist on call if the hospitalist that took care of you is not available. Once you are discharged, your primary care physician will handle any further medical issues. Please note that NO REFILLS for any discharge medications will be authorized once you are discharged, as it is imperative that you return to your primary care physician (or establish a relationship with a primary care physician if you do not have one) for your aftercare needs so that they can reassess your need for medications and monitor your lab values.     Increase activity slowly    Complete by:  As directed              Discharge Medications       Medication List    STOP taking these medications        metoprolol succinate 25 MG 24 hr tablet  Commonly known as:  TOPROL-XL     oxyCODONE-acetaminophen 5-325 MG tablet  Commonly known as:  PERCOCET/ROXICET      TAKE these medications        acetaminophen 500 MG tablet  Commonly known as:  TYLENOL  Take 1,000 mg by mouth every 6 (six) hours as needed for moderate pain.     albuterol 108 (90 Base) MCG/ACT inhaler  Commonly known as:  PROAIR HFA  Inhale 2 puffs into the lungs  every 6 (six) hours as needed for wheezing.     albuterol (2.5 MG/3ML) 0.083% nebulizer solution  Commonly known as:  PROVENTIL  Take 3 mLs (2.5 mg total) by nebulization every 6 (six) hours as needed for wheezing or shortness of breath.     alendronate 70 MG tablet  Commonly known as:  FOSAMAX  Take 70 mg by mouth every 7 (seven) days. Take with a full  glass of water on an empty stomach. Takes on Thursday     apixaban 2.5 MG Tabs tablet  Commonly known as:  ELIQUIS  Take 1 tablet (2.5 mg total) by mouth 2 (two) times daily.     calcium citrate-vitamin D 315-200 MG-UNIT tablet  Commonly known as:  CITRACAL+D  Take 1 tablet by mouth 2 (two) times daily.     cyclobenzaprine 5 MG tablet  Commonly known as:  FLEXERIL  Take 1 tablet (5 mg total) by mouth daily.     cycloSPORINE 0.05 % ophthalmic emulsion  Commonly known as:  RESTASIS  Place 1 drop into both eyes 2 (two) times daily.     diltiazem 180 MG 24 hr capsule  Commonly known as:  CARDIZEM CD  Take 1 capsule (180 mg total) by mouth daily.     feeding supplement (ENSURE ENLIVE) Liqd  Take 237 mLs by mouth 2 (two) times daily between meals.     fluticasone 50 MCG/ACT nasal spray  Commonly known as:  FLONASE  Place 2 sprays into both nostrils 2 (two) times daily as needed for allergies.     furosemide 20 MG tablet  Commonly known as:  LASIX  Take 1 tablet (20 mg total) by mouth 2 (two) times daily.     guaiFENesin-dextromethorphan 100-10 MG/5ML syrup  Commonly known as:  ROBITUSSIN DM  Take 10 mLs by mouth every 4 (four) hours as needed for cough.     mirtazapine 15 MG tablet  Commonly known as:  REMERON  Take 1 tablet (15 mg total) by mouth at bedtime.     multivitamin tablet  Take 1 tablet by mouth 2 (two) times daily.     Oxycodone HCl 10 MG Tabs  Take 1 tablet (10 mg total) by mouth every 6 (six) hours as needed.     potassium chloride SA 20 MEQ tablet  Commonly known as:  K-DUR,KLOR-CON  Take 1 tablet (20 mEq total) by mouth 2 (two) times daily.     traMADol 50 MG tablet  Commonly known as:  ULTRAM  Take 1 tablet (50 mg total) by mouth every 6 (six) hours as needed for moderate pain.        Major procedures and Radiology Reports - PLEASE review detailed and final reports for all details, in brief -   TTE  - Left ventricle: Small ventrical apex not  well seen possible non obstructive HOCM. No SAM or LVOT gradient. The cavity size was normal. Wall thickness was increased in a pattern of severe LVH. Systolic function was normal. The estimated ejection fraction was in the range of 50% to 55%. - Aortic valve: There was trivial regurgitation. - Left atrium: The atrium was severely dilated. - Atrial septum: No defect or patent foramen ovale was identified. - Pulmonary arteries: PA peak pressure: 66 mm Hg (S).   Dg Chest 2 View  06/08/2016  CLINICAL DATA:  Acute onset of shortness of breath, cough and wheezing. Initial encounter. EXAM: CHEST  2 VIEW COMPARISON:  Chest radiograph performed 05/17/2015 FINDINGS: The lungs  are well-aerated. Mild right midlung opacity could reflect pneumonia, given the patient's symptoms. There is no evidence of pleural effusion or pneumothorax. The heart is mildly enlarged. Calcification is noted at the mitral valve. The patient is status post median sternotomy. A pacemaker is noted at the right chest wall, with leads ending at the right atrium and right ventricle. No acute osseous abnormalities are seen. Cervical spinal fusion hardware is noted. The patient's right shoulder arthroplasty is grossly unremarkable in appearance, though incompletely imaged. IMPRESSION: 1. Mild right midlung opacity could reflect pneumonia, given the patient's symptoms. 2. Mild cardiomegaly.  Calcification at the mitral valve. Electronically Signed   By: Garald Balding M.D.   On: 06/08/2016 20:52   Dg Humerus Right  06/08/2016  CLINICAL DATA:  82 year old female with worsening right humeral fracture. EXAM: RIGHT HUMERUS - 2+ VIEW COMPARISON:  Right femur radiograph dated 05/31/2016 FINDINGS: There is a spiral fracture of the mid shaft of the right humerus starting adjacent to the distal aspect of the humeral stem of the prosthesis an extending to the distal diaphysis. There is no displacement, however there has been interval widening of the fracture  gap compared to the prior study. The fracture distraction measures approximately 5 mm. No other fracture identified. The bones are osteopenic. The right shoulder prosthesis appears in similar positioning as the prior study. The bones are osteopenic. The soft tissues appear unremarkable. IMPRESSION: Interval increase in the distraction of the previously seen spiral fracture involving the mid to distal right humeral diaphysis. Electronically Signed   By: Anner Crete M.D.   On: 06/08/2016 21:30   Dg Humerus Right  05/31/2016  CLINICAL DATA:  Pt fell today onto right arm; c/o right upper arm pain with no range of motion; hx surgery to right arm pt doesn't recall when EXAM: RIGHT HUMERUS - 2+ VIEW COMPARISON:  03/28/2007 FINDINGS: There is an acute spiral, nondisplaced, non comminuted and nonangulated fracture of the left humeral shaft extending from the midportion, adjacent to the distal margin of the humeral stem of the right shoulder prosthesis, distally to the distal diaphysis. No other fractures. The [facility] joint is separated with the distal clavicle lying above the acromion, new from the prior exam. There is no associated soft tissue swelling. This is likely chronic. The shoulder prosthesis appears well seated and aligned. The elbow joint is normally aligned. The bones are diffusely demineralized. IMPRESSION: 1. Acute, nondisplaced, nonangulated and non comminuted fracture of the right humeral diaphysis. Electronically Signed   By: Lajean Manes M.D.   On: 05/31/2016 12:36    Micro Results    No results found for this or any previous visit (from the past 240 hour(s)).   Today   Subjective    [name] today has no headache,no chest abdominal pain,no new weakness tingling or numbness, feels much better    Objective   Blood pressure 101/49, pulse 62, temperature 97.7 F (36.5 C), temperature source Axillary, resp. rate 18, height 5\' 2"  (1.575 m), weight 52.7 kg (116 lb 2.9 oz), SpO2 94  %.   Intake/Output Summary (Last 24 hours) at 06/13/16 0945 Last data filed at 06/13/16 0400  Gross per 24 hour  Intake   1390 ml  Output    401 ml  Net    989 ml    Exam Awake Alert, Oriented x 3, No new F.N deficits, Normal affect [place].AT,PERRAL Supple Neck,No JVD, No cervical lymphadenopathy appriciated.  Symmetrical Chest wall movement, Good air movement bilaterally, CTAB RRR,No  Gallops,Rubs or new Murmurs, No Parasternal Heave +ve B.Sounds, Abd Soft, Non tender, No organomegaly appriciated, No rebound -guarding or rigidity. No Cyanosis, Clubbing or edema, No new Rash or bruise, R BKA, R Arm in sling   Data Review   CBC w Diff:  Lab Results  Component Value Date   WBC 8.9 06/09/2016   WBC 6.5 06/29/2015   WBC 6.0 07/18/2011   HGB 12.1 06/09/2016   HGB 12.9 06/29/2015   HGB 13.1 07/18/2011   HCT 36.4 06/09/2016   HCT 39.4 06/29/2015   HCT 37.6 07/18/2011   PLT 179 06/09/2016   PLT 207 06/29/2015   PLT 159 07/18/2011   LYMPHOPCT 7 06/08/2016   LYMPHOPCT 14.8 06/29/2015   LYMPHOPCT 11.1* 07/18/2011   MONOPCT 8 06/08/2016   MONOPCT 8.3 06/29/2015   MONOPCT 16.8* 07/18/2011   EOSPCT 0 06/08/2016   EOSPCT 1.0 06/29/2015   EOSPCT 1.7 07/18/2011   BASOPCT 0 06/08/2016   BASOPCT 0.9 06/29/2015   BASOPCT 0.7 07/18/2011    CMP:  Lab Results  Component Value Date   NA 137 06/13/2016   NA 140 06/29/2015   K 4.4 06/13/2016   K 4.6 06/29/2015   CL 101 06/13/2016   CL 105 04/24/2013   CO2 27 06/13/2016   CO2 26 06/29/2015   BUN 38* 06/13/2016   BUN 15.8 06/29/2015   CREATININE 1.19* 06/13/2016   CREATININE 1.0 06/29/2015   PROT 6.8 06/08/2016   PROT 6.3* 06/29/2015   ALBUMIN 4.1 06/08/2016   ALBUMIN 3.4* 06/29/2015   BILITOT 1.7* 06/08/2016   BILITOT 0.45 06/29/2015   ALKPHOS 77 06/08/2016   ALKPHOS 63 06/29/2015   AST 47* 06/08/2016   AST 18 06/29/2015   ALT 26 06/08/2016   ALT 9 06/29/2015  .   Total Time in preparing paper work, data evaluation  and todays exam - 35 minutes  Thurnell Lose M.D on 06/13/2016 at 9:45 AM  [facility]   Office  [phone]

## 2016-06-13 NOTE — Progress Notes (Signed)
Patient is set to discharge to [facility] today. Patient & family aware. Discharge packet given to RN, Susie. [facility] called for transport.     Kingsley Spittle, [place] Clinical Social Worker [phone]

## 2016-06-13 NOTE — Discharge Instructions (Signed)
Follow with Primary MD Jani Gravel, MD in 7 days   Get CBC, CMP, 2 view Chest X ray checked  by Primary MD next visit.    Activity: As tolerated with Full fall precautions use walker/cane & assistance as needed   Disposition SNF     Diet:   Heart Healthy  Check your Weight same time everyday, if you gain over 2 pounds, or you develop in leg swelling, experience more shortness of breath or chest pain, call your Primary MD immediately. Follow Cardiac Low Salt Diet and 1.5 lit/day fluid restriction.   On your next visit with your primary care physician please Get Medicines reviewed and adjusted.   Please request your Prim.MD to go over all Hospital Tests and Procedure/Radiological results at the follow up, please get all Hospital records sent to your Prim MD by signing hospital release before you go home.   If you experience worsening of your admission symptoms, develop shortness of breath, life threatening emergency, suicidal or homicidal thoughts you must seek medical attention immediately by calling 911 or calling your MD immediately  if symptoms less severe.  You Must read complete instructions/literature along with all the possible adverse reactions/side effects for all the Medicines you take and that have been prescribed to you. Take any new Medicines after you have completely understood and accpet all the possible adverse reactions/side effects.   Do not drive, operate heavy machinery, perform activities at heights, swimming or participation in water activities or provide baby sitting services if your were admitted for syncope or siezures until you have seen by Primary MD or a Neurologist and advised to do so again.  Do not drive when taking Pain medications.    Do not take more than prescribed Pain, Sleep and Anxiety Medications  Special Instructions: If you have smoked or chewed Tobacco  in the last 2 yrs please stop smoking, stop any regular Alcohol  and or any Recreational drug  use.  Wear Seat belts while driving.   Please note  You were cared for by a hospitalist during your hospital stay. If you have any questions about your discharge medications or the care you received while you were in the hospital after you are discharged, you can call the unit and asked to speak with the hospitalist on call if the hospitalist that took care of you is not available. Once you are discharged, your primary care physician will handle any further medical issues. Please note that NO REFILLS for any discharge medications will be authorized once you are discharged, as it is imperative that you return to your primary care physician (or establish a relationship with a primary care physician if you do not have one) for your aftercare needs so that they can reassess your need for medications and monitor your lab values.

## 2016-06-13 NOTE — Clinical Social Work Placement (Signed)
   CLINICAL SOCIAL WORK PLACEMENT  NOTE  Date:  06/13/2016  Patient Details  Name: [name] MRN: [id number] Date of Birth: 15-Jan-1931  Clinical Social Work is seeking post-discharge placement for this patient at the [place] level of care (*CSW will initial, date and re-position this form in  chart as items are completed):  Yes   Patient/family provided with [place] Work Department's list of facilities offering this level of care within the geographic area requested by the patient (or if unable, by the patient's family).  Yes   Patient/family informed of their freedom to choose among providers that offer the needed level of care, that participate in Medicare, Medicaid or managed care program needed by the patient, have an available bed and are willing to accept the patient.  Yes   Patient/family informed of [facility]'s ownership interest in [facility] and [facility], as well as of the fact that they are under no obligation to receive care at these facilities.  PASRR submitted to EDS on       PASRR number received on       Existing PASRR number confirmed on 06/10/16     FL2 transmitted to all facilities in geographic area requested by pt/family on 06/10/16     FL2 transmitted to all facilities within larger geographic area on       Patient informed that his/her managed care company has contracts with or will negotiate with certain facilities, including the following:        Yes   Patient/family informed of bed offers received.  Patient chooses bed at [facility], [place]     Physician recommends and patient chooses bed at      Patient to be transferred to [facility] on 06/13/16.  Patient to be transferred to facility by Family Member      Patient family notified on 06/13/16 of transfer.  Name of family member notified:  Patient      PHYSICIAN       Additional Comment:     _______________________________________________ Weston Anna, LCSW 06/13/2016, 12:24 PM

## 2016-06-13 NOTE — Care Management Note (Signed)
Case Management Note  Patient Details  Name: [name] MRN: [id number] Date of Birth: [birth year]  Subjective/Objective:                    Action/Plan:dc SNF.   Expected Discharge Date:                  Expected Discharge Plan:  Skilled Nursing Facility  In-House Referral:  Clinical Social Work  Discharge planning Services  CM Consult  Post Acute Care Choice:    Choice offered to:     DME Arranged:    DME Agency:     HH Arranged:    [facility] [place] Agency:     Status of Service:  Completed, signed off  If discussed at [employer], dates discussed:    Additional Comments:  Dessa Phi, RN 06/13/2016, 10:54 AM

## 2016-06-13 NOTE — Care Management Important Message (Signed)
Important Message  Patient Details  Name: CHANNELLE LANGILL MRN: [id number] Date of Birth: [birth year]   Medicare Important Message Given:  Yes    Camillo Flaming 06/13/2016, 11:40 AMImportant Message  Patient Details  Name: ABIGAEL BROHL MRN: [id number] Date of Birth: 10-Mar-1931   Medicare Important Message Given:  Yes    Camillo Flaming 06/13/2016, 11:40 AM

## 2016-06-15 DIAGNOSIS — I480 Paroxysmal atrial fibrillation: Secondary | ICD-10-CM | POA: Diagnosis not present

## 2016-06-15 DIAGNOSIS — K5909 Other constipation: Secondary | ICD-10-CM | POA: Diagnosis not present

## 2016-06-15 DIAGNOSIS — J9690 Respiratory failure, unspecified, unspecified whether with hypoxia or hypercapnia: Secondary | ICD-10-CM | POA: Diagnosis not present

## 2016-06-15 DIAGNOSIS — I10 Essential (primary) hypertension: Secondary | ICD-10-CM | POA: Diagnosis not present

## 2016-06-15 DIAGNOSIS — R2689 Other abnormalities of gait and mobility: Secondary | ICD-10-CM | POA: Diagnosis not present

## 2016-06-15 DIAGNOSIS — S42201A Unspecified fracture of upper end of right humerus, initial encounter for closed fracture: Secondary | ICD-10-CM | POA: Diagnosis not present

## 2016-06-20 DIAGNOSIS — S42301D Unspecified fracture of shaft of humerus, right arm, subsequent encounter for fracture with routine healing: Secondary | ICD-10-CM | POA: Diagnosis not present

## 2016-06-24 DIAGNOSIS — I482 Chronic atrial fibrillation: Secondary | ICD-10-CM | POA: Diagnosis not present

## 2016-06-24 DIAGNOSIS — I272 Other secondary pulmonary hypertension: Secondary | ICD-10-CM | POA: Diagnosis not present

## 2016-06-24 DIAGNOSIS — Z950 Presence of cardiac pacemaker: Secondary | ICD-10-CM | POA: Diagnosis not present

## 2016-06-24 DIAGNOSIS — I5032 Chronic diastolic (congestive) heart failure: Secondary | ICD-10-CM | POA: Diagnosis not present

## 2016-06-25 DIAGNOSIS — Z950 Presence of cardiac pacemaker: Secondary | ICD-10-CM | POA: Diagnosis not present

## 2016-06-28 ENCOUNTER — Encounter: Payer: Self-pay | Admitting: Hematology

## 2016-06-28 ENCOUNTER — Ambulatory Visit (HOSPITAL_BASED_OUTPATIENT_CLINIC_OR_DEPARTMENT_OTHER): Payer: Medicare Other | Admitting: Hematology

## 2016-06-28 ENCOUNTER — Other Ambulatory Visit (HOSPITAL_BASED_OUTPATIENT_CLINIC_OR_DEPARTMENT_OTHER): Payer: Medicare Other

## 2016-06-28 VITALS — BP 99/53 | HR 87 | Temp 98.3°F | Resp 18 | Ht 62.0 in | Wt 132.6 lb

## 2016-06-28 DIAGNOSIS — C50912 Malignant neoplasm of unspecified site of left female breast: Secondary | ICD-10-CM

## 2016-06-28 DIAGNOSIS — Z853 Personal history of malignant neoplasm of breast: Secondary | ICD-10-CM

## 2016-06-28 LAB — COMPREHENSIVE METABOLIC PANEL
ALT: 17 U/L (ref 0–55)
AST: 28 U/L (ref 5–34)
Albumin: 3.7 g/dL (ref 3.5–5.0)
Alkaline Phosphatase: 98 U/L (ref 40–150)
Anion Gap: 11 mEq/L (ref 3–11)
BUN: 29.8 mg/dL — ABNORMAL HIGH (ref 7.0–26.0)
CO2: 21 mEq/L — ABNORMAL LOW (ref 22–29)
Calcium: 9.4 mg/dL (ref 8.4–10.4)
Chloride: 109 mEq/L (ref 98–109)
Creatinine: 1.2 mg/dL — ABNORMAL HIGH (ref 0.6–1.1)
EGFR: 43 mL/min/{1.73_m2} — ABNORMAL LOW (ref >90)
Glucose: 145 mg/dl — ABNORMAL HIGH (ref 70–140)
Potassium: 4.2 mEq/L (ref 3.5–5.1)
Sodium: 142 mEq/L (ref 136–145)
Total Bilirubin: 0.44 mg/dL (ref 0.20–1.20)
Total Protein: 7.1 g/dL (ref 6.4–8.3)

## 2016-06-28 LAB — CBC WITH DIFFERENTIAL/PLATELET
BASO%: 1 % (ref 0.0–2.0)
Basophils Absolute: 0.1 10*3/uL (ref 0.0–0.1)
EOS%: 1.5 % (ref 0.0–7.0)
Eosinophils Absolute: 0.1 10*3/uL (ref 0.0–0.5)
HCT: 41 % (ref 34.8–46.6)
HGB: 13.5 g/dL (ref 11.6–15.9)
LYMPH%: 7.6 % — ABNORMAL LOW (ref 14.0–49.7)
MCH: 32.8 pg (ref 25.1–34.0)
MCHC: 33 g/dL (ref 31.5–36.0)
MCV: 99.2 fL (ref 79.5–101.0)
MONO#: 0.5 10*3/uL (ref 0.1–0.9)
MONO%: 6.5 % (ref 0.0–14.0)
NEUT#: 6.7 10*3/uL — ABNORMAL HIGH (ref 1.5–6.5)
NEUT%: 83.4 % — ABNORMAL HIGH (ref 38.4–76.8)
Platelets: 283 10*3/uL (ref 145–400)
RBC: 4.13 10*6/uL (ref 3.70–5.45)
RDW: 15.2 % — ABNORMAL HIGH (ref 11.2–14.5)
WBC: 8 10*3/uL (ref 3.9–10.3)
lymph#: 0.6 10*3/uL — ABNORMAL LOW (ref 0.9–3.3)

## 2016-06-28 NOTE — Progress Notes (Signed)
[place] OFFICE PROGRESS NOTE  CC  Sharon Gravel, MD [address] [place] [address]  DIAGNOSIS: 82 year old female with DCIS of the left breast diagnosed 04/05/2011. The tumor was ER positive PR positive  PRIOR THERAPY:  #1 patient is status post lumpectomy on 04/05/2011 for a high-grade DCIS that was ER and PR positive.  #2 she then underwent radiation therapy from 05/23/2011 2 06/26/2011 for a total of 52.5 gray.  #3 she was then begun on Aromasin 25 mg daily starting 07/18/2011. A total of 5 years of therapy is planned.  This was discontinued in April 2014 due to continued worsening in osteoporosis.    CURRENT THERAPY: Observation  INTERVAL HISTORY: [name] 82 y.o. female returns for followup, she was last seen by me 1 year ago. She unfortunately had fracture of right humerus 1 months ago, and has not been able to use right hand much. She did not have surgery, has been managed conservatively with a slim. She has not been walking much because she cannot use a walker. She is currently in nursing home, but appendical home soon. She otherwise doing well, has good appetite and energy level, denies any other new symptoms.   MEDICAL HISTORY: Past Medical History  Diagnosis Date  . Mitral regurgitation   . Hypertrophic obstructive cardiomyopathy     Required septal myotomy  . Urinary incontinence   . Cystitis   . Hemorrhoids   . Bronchitis   . Asthma   . Anemia   . Heart block     Complete heart block post surgical requiring pacer  . Hypokalemia     Postoperative, improved  . Hyponatremia     Mild postoperative, improved  . Blood transfusion   . Constipation   . Arthritis pain   . Cancer [facility])     lt breast  . Hypertension     Benign Essential Hypertension    . Current use of long term anticoagulation   . Chronic atrial fibrillation ([place])   . Arthritis   . Pacemaker   . Memory difficulty   . Humerus shaft fracture    nonsurgical, June 2017    ALLERGIES:  is allergic to ceftaroline; codeine; morphine; and sulfa antibiotics.  MEDICATIONS:  Current Outpatient Prescriptions  Medication Sig Dispense Refill  . acetaminophen (TYLENOL) 500 MG tablet Take 1,000 mg by mouth every 6 (six) hours as needed for moderate pain.     Marland Kitchen albuterol (PROAIR HFA) 108 (90 BASE) MCG/ACT inhaler Inhale 2 puffs into the lungs every 6 (six) hours as needed for wheezing. 1 Inhaler 3  . albuterol (PROVENTIL) (2.5 MG/3ML) 0.083% nebulizer solution Take 3 mLs (2.5 mg total) by nebulization every 6 (six) hours as needed for wheezing or shortness of breath. 75 mL 12  . alendronate (FOSAMAX) 70 MG tablet Take 70 mg by mouth every 7 (seven) days. Take with a full glass of water on an empty stomach. Takes on Thursday    . apixaban (ELIQUIS) 2.5 MG TABS tablet Take 1 tablet (2.5 mg total) by mouth 2 (two) times daily. 60 tablet 1  . calcium citrate-vitamin D (CITRACAL+D) 315-200 MG-UNIT per tablet Take 1 tablet by mouth 2 (two) times daily. 60 tablet 1  . cyclobenzaprine (FLEXERIL) 5 MG tablet Take 1 tablet (5 mg total) by mouth daily. (Patient taking differently: Take 5 mg by mouth daily as needed for muscle spasms. ) 30 tablet 0  . cycloSPORINE (RESTASIS) 0.05 % ophthalmic emulsion Place  1 drop into both eyes 2 (two) times daily. 0.4 mL 1  . diltiazem (CARDIZEM CD) 180 MG 24 hr capsule Take 1 capsule (180 mg total) by mouth daily. 30 capsule 1  . feeding supplement, ENSURE ENLIVE, (ENSURE ENLIVE) LIQD Take 237 mLs by mouth 2 (two) times daily between meals. 237 mL 12  . fluticasone (FLONASE) 50 MCG/ACT nasal spray Place 2 sprays into both nostrils 2 (two) times daily as needed for allergies.     . furosemide (LASIX) 20 MG tablet Take 1 tablet (20 mg total) by mouth 2 (two) times daily. 60 tablet 1  . guaiFENesin-dextromethorphan (ROBITUSSIN DM) 100-10 MG/5ML syrup Take 10 mLs by mouth every 4 (four) hours as needed for cough. 118 mL 0  .  mirtazapine (REMERON) 15 MG tablet Take 1 tablet (15 mg total) by mouth at bedtime. 30 tablet 1  . Multiple Vitamin (MULTIVITAMIN) tablet Take 1 tablet by mouth 2 (two) times daily.     . Oxycodone HCl 10 MG TABS Take 1 tablet (10 mg total) by mouth every 6 (six) hours as needed. 10 tablet 0  . potassium chloride SA (K-DUR,KLOR-CON) 20 MEQ tablet Take 1 tablet (20 mEq total) by mouth 2 (two) times daily. 60 tablet 1  . traMADol (ULTRAM) 50 MG tablet Take 1 tablet (50 mg total) by mouth every 6 (six) hours as needed for moderate pain. 10 tablet 0   No current facility-administered medications for this visit.    SURGICAL HISTORY:  Past Surgical History  Procedure Laterality Date  . Anterior cervical diskectomy and fusion      C-7  . Septal myotomy    . Pacemaker placement  2006/11-27-2013    MDT dual chamber pacemaker implanted 2006 with gen change by Dr Lovena Le 11-27-2013  . Replacement total knee bilateral    . Femur fracture surgery      Right  . Wrist fracture surgery      Right  . Shoulder surgery      Right  . Breast biopsy      Left, negative  . Colonoscopy    . Abdominal hysterectomy    . Cardiomyopathy    . Breast lumpectomy  2012    left breast  . Joint replacement    . Permanent pacemaker generator change N/A 11/27/2013    Procedure: PERMANENT PACEMAKER GENERATOR CHANGE;  Surgeon: Evans Lance, MD;  Location: [facility] CATH LAB;  Service: Cardiovascular;  Laterality: N/A;  . Below knee leg amputation Right     REVIEW OF SYSTEMS:  A 10 point review of systems was conducted and is otherwise negative except for what is noted above.     HEALTH MAINTENANCE:  Mammogram: 12/2014, normal Colonoscopy: 4 years ago, no follow up Bone Density: 02/2013, osteoporosis on Fosamax Pap Smear:s/p TAH, several years Eye Exam: 10/2012 Vitamin D: 04/2013 Lipid Panel: 05/2012  PHYSICAL EXAMINATION: Blood pressure 99/53, pulse 87, temperature 98.3 F (36.8 C), temperature source Oral,  resp. rate 18, height 5\' 2"  (1.575 m), weight 132 lb 9.6 oz (60.147 kg), SpO2 100 %. Body mass index is 24.25 kg/(m^2). General: Patient is a well appearing female in no acute distress HEENT: PERRLA, sclerae anicteric no conjunctival pallor, MMM Neck: supple, no palpable adenopathy Lungs: clear to auscultation bilaterally, no wheezes, rhonchi, or rales Cardiovascular: regular rate rhythm, S1, S2, no murmurs, rubs or gallops Abdomen: Soft, non-tender, non-distended, normoactive bowel sounds, no HSM Extremities: warm and well perfused, no clubbing, cyanosis, or edema Skin: No rashes  or lesions Neuro: Non-focal Bilateral breast exam no masses nipple discharge left breast reveals well-healed incisional scar there is noted to be scar tissue but no discrete masses. Right breast no masses or nipple discharge.  ECOG PERFORMANCE STATUS: 3    LABORATORY DATA: CBC Latest Ref Rng 06/28/2016 06/09/2016 06/08/2016  WBC 3.9 - 10.3 10e3/uL 8.0 8.9 7.9  Hemoglobin 11.6 - 15.9 g/dL 13.5 12.1 12.2  Hematocrit 34.8 - 46.6 % 41.0 36.4 36.4  Platelets 145 - 400 10e3/uL 283 179 163    CMP Latest Ref Rng 06/28/2016 06/13/2016 06/12/2016  Glucose 70 - 140 mg/dl 145(H) 106(H) 112(H)  BUN 7.0 - 26.0 mg/dL 29.8(H) 38(H) 38(H)  Creatinine 0.6 - 1.1 mg/dL 1.2(H) 1.19(H) 1.33(H)  Sodium 136 - 145 mEq/L 142 137 138  Potassium 3.5 - 5.1 mEq/L 4.2 4.4 4.7  Chloride 101 - 111 mmol/L - 101 100(L)  CO2 22 - 29 mEq/L 21(L) 27 25  Calcium 8.4 - 10.4 mg/dL 9.4 9.0 9.2  Total Protein 6.4 - 8.3 g/dL 7.1 - -  Total Bilirubin 0.20 - 1.20 mg/dL 0.44 - -  Alkaline Phos 40 - 150 U/L 98 - -  AST 5 - 34 U/L 28 - -  ALT 0 - 55 U/L 17 - -        RADIOGRAPHIC STUDIES:  No results found.  ASSESSMENT AND PLAN: 82 year old female with DCIS of the left breast status post lumpectomy followed by radiation previously on Aromasin as a chemopreventive 25 mg daily which she stopped due to worsenng osteoporosis. She is taking vitamin D  and she has her vitamin D levels checked at her primary care physician's office. She has no evidence of recurrent disease.  1. Left breast DCIS, high grade, ER+/PR+ - her breast DCIS was cured by complete surgical resection. -clinically doing well, no evidence of recurrence -Continue surveillance.  Her last mammogram in January 2016 was normal -Due to her advanced age, okay to stop screening mammogram. -She has completed 5 years surveillance, I suggest her to follow-up her primary care physician, and see me as needed in the future.  2. Right humeral fracture  -Follow up with her orthopedic surgeon  Follow-up: As needed. She'll follow-up with her primary care physician   Truitt Merle  06/28/2016, 2:43 PM

## 2016-07-09 DIAGNOSIS — I421 Obstructive hypertrophic cardiomyopathy: Secondary | ICD-10-CM | POA: Diagnosis not present

## 2016-07-09 DIAGNOSIS — I482 Chronic atrial fibrillation: Secondary | ICD-10-CM | POA: Diagnosis not present

## 2016-07-09 DIAGNOSIS — Z89511 Acquired absence of right leg below knee: Secondary | ICD-10-CM | POA: Diagnosis not present

## 2016-07-09 DIAGNOSIS — Z7901 Long term (current) use of anticoagulants: Secondary | ICD-10-CM | POA: Diagnosis not present

## 2016-07-09 DIAGNOSIS — I5032 Chronic diastolic (congestive) heart failure: Secondary | ICD-10-CM | POA: Diagnosis not present

## 2016-07-09 DIAGNOSIS — I110 Hypertensive heart disease with heart failure: Secondary | ICD-10-CM | POA: Diagnosis not present

## 2016-07-09 DIAGNOSIS — S42301D Unspecified fracture of shaft of humerus, right arm, subsequent encounter for fracture with routine healing: Secondary | ICD-10-CM | POA: Diagnosis not present

## 2016-07-09 DIAGNOSIS — Z9181 History of falling: Secondary | ICD-10-CM | POA: Diagnosis not present

## 2016-07-09 DIAGNOSIS — Z950 Presence of cardiac pacemaker: Secondary | ICD-10-CM | POA: Diagnosis not present

## 2016-07-11 DIAGNOSIS — I482 Chronic atrial fibrillation: Secondary | ICD-10-CM | POA: Diagnosis not present

## 2016-07-11 DIAGNOSIS — Z950 Presence of cardiac pacemaker: Secondary | ICD-10-CM | POA: Diagnosis not present

## 2016-07-11 DIAGNOSIS — I272 Other secondary pulmonary hypertension: Secondary | ICD-10-CM | POA: Diagnosis not present

## 2016-07-11 DIAGNOSIS — I5032 Chronic diastolic (congestive) heart failure: Secondary | ICD-10-CM | POA: Diagnosis not present

## 2016-07-12 DIAGNOSIS — Z4789 Encounter for other orthopedic aftercare: Secondary | ICD-10-CM | POA: Diagnosis not present

## 2016-07-12 DIAGNOSIS — S42301D Unspecified fracture of shaft of humerus, right arm, subsequent encounter for fracture with routine healing: Secondary | ICD-10-CM | POA: Diagnosis not present

## 2016-07-13 DIAGNOSIS — I5032 Chronic diastolic (congestive) heart failure: Secondary | ICD-10-CM | POA: Diagnosis not present

## 2016-07-13 DIAGNOSIS — I482 Chronic atrial fibrillation: Secondary | ICD-10-CM | POA: Diagnosis not present

## 2016-07-13 DIAGNOSIS — I110 Hypertensive heart disease with heart failure: Secondary | ICD-10-CM | POA: Diagnosis not present

## 2016-07-13 DIAGNOSIS — Z89511 Acquired absence of right leg below knee: Secondary | ICD-10-CM | POA: Diagnosis not present

## 2016-07-13 DIAGNOSIS — I421 Obstructive hypertrophic cardiomyopathy: Secondary | ICD-10-CM | POA: Diagnosis not present

## 2016-07-13 DIAGNOSIS — S42301D Unspecified fracture of shaft of humerus, right arm, subsequent encounter for fracture with routine healing: Secondary | ICD-10-CM | POA: Diagnosis not present

## 2016-07-14 DIAGNOSIS — I482 Chronic atrial fibrillation: Secondary | ICD-10-CM | POA: Diagnosis not present

## 2016-07-14 DIAGNOSIS — I421 Obstructive hypertrophic cardiomyopathy: Secondary | ICD-10-CM | POA: Diagnosis not present

## 2016-07-14 DIAGNOSIS — S42301D Unspecified fracture of shaft of humerus, right arm, subsequent encounter for fracture with routine healing: Secondary | ICD-10-CM | POA: Diagnosis not present

## 2016-07-14 DIAGNOSIS — I5032 Chronic diastolic (congestive) heart failure: Secondary | ICD-10-CM | POA: Diagnosis not present

## 2016-07-14 DIAGNOSIS — I110 Hypertensive heart disease with heart failure: Secondary | ICD-10-CM | POA: Diagnosis not present

## 2016-07-14 DIAGNOSIS — Z89511 Acquired absence of right leg below knee: Secondary | ICD-10-CM | POA: Diagnosis not present

## 2016-07-15 DIAGNOSIS — I110 Hypertensive heart disease with heart failure: Secondary | ICD-10-CM | POA: Diagnosis not present

## 2016-07-15 DIAGNOSIS — Z89511 Acquired absence of right leg below knee: Secondary | ICD-10-CM | POA: Diagnosis not present

## 2016-07-15 DIAGNOSIS — I482 Chronic atrial fibrillation: Secondary | ICD-10-CM | POA: Diagnosis not present

## 2016-07-15 DIAGNOSIS — I5032 Chronic diastolic (congestive) heart failure: Secondary | ICD-10-CM | POA: Diagnosis not present

## 2016-07-15 DIAGNOSIS — I421 Obstructive hypertrophic cardiomyopathy: Secondary | ICD-10-CM | POA: Diagnosis not present

## 2016-07-15 DIAGNOSIS — S42301D Unspecified fracture of shaft of humerus, right arm, subsequent encounter for fracture with routine healing: Secondary | ICD-10-CM | POA: Diagnosis not present

## 2016-07-19 DIAGNOSIS — I482 Chronic atrial fibrillation: Secondary | ICD-10-CM | POA: Diagnosis not present

## 2016-07-19 DIAGNOSIS — I110 Hypertensive heart disease with heart failure: Secondary | ICD-10-CM | POA: Diagnosis not present

## 2016-07-19 DIAGNOSIS — S42301D Unspecified fracture of shaft of humerus, right arm, subsequent encounter for fracture with routine healing: Secondary | ICD-10-CM | POA: Diagnosis not present

## 2016-07-19 DIAGNOSIS — I5032 Chronic diastolic (congestive) heart failure: Secondary | ICD-10-CM | POA: Diagnosis not present

## 2016-07-19 DIAGNOSIS — I421 Obstructive hypertrophic cardiomyopathy: Secondary | ICD-10-CM | POA: Diagnosis not present

## 2016-07-19 DIAGNOSIS — Z89511 Acquired absence of right leg below knee: Secondary | ICD-10-CM | POA: Diagnosis not present

## 2016-07-20 DIAGNOSIS — Z89511 Acquired absence of right leg below knee: Secondary | ICD-10-CM | POA: Diagnosis not present

## 2016-07-20 DIAGNOSIS — I421 Obstructive hypertrophic cardiomyopathy: Secondary | ICD-10-CM | POA: Diagnosis not present

## 2016-07-20 DIAGNOSIS — I110 Hypertensive heart disease with heart failure: Secondary | ICD-10-CM | POA: Diagnosis not present

## 2016-07-20 DIAGNOSIS — S42301D Unspecified fracture of shaft of humerus, right arm, subsequent encounter for fracture with routine healing: Secondary | ICD-10-CM | POA: Diagnosis not present

## 2016-07-20 DIAGNOSIS — I5032 Chronic diastolic (congestive) heart failure: Secondary | ICD-10-CM | POA: Diagnosis not present

## 2016-07-20 DIAGNOSIS — I482 Chronic atrial fibrillation: Secondary | ICD-10-CM | POA: Diagnosis not present

## 2016-07-21 DIAGNOSIS — S42301D Unspecified fracture of shaft of humerus, right arm, subsequent encounter for fracture with routine healing: Secondary | ICD-10-CM | POA: Diagnosis not present

## 2016-07-21 DIAGNOSIS — I421 Obstructive hypertrophic cardiomyopathy: Secondary | ICD-10-CM | POA: Diagnosis not present

## 2016-07-21 DIAGNOSIS — I5032 Chronic diastolic (congestive) heart failure: Secondary | ICD-10-CM | POA: Diagnosis not present

## 2016-07-21 DIAGNOSIS — I482 Chronic atrial fibrillation: Secondary | ICD-10-CM | POA: Diagnosis not present

## 2016-07-21 DIAGNOSIS — I110 Hypertensive heart disease with heart failure: Secondary | ICD-10-CM | POA: Diagnosis not present

## 2016-07-21 DIAGNOSIS — Z89511 Acquired absence of right leg below knee: Secondary | ICD-10-CM | POA: Diagnosis not present

## 2016-07-22 DIAGNOSIS — I482 Chronic atrial fibrillation: Secondary | ICD-10-CM | POA: Diagnosis not present

## 2016-07-22 DIAGNOSIS — Z89511 Acquired absence of right leg below knee: Secondary | ICD-10-CM | POA: Diagnosis not present

## 2016-07-22 DIAGNOSIS — I421 Obstructive hypertrophic cardiomyopathy: Secondary | ICD-10-CM | POA: Diagnosis not present

## 2016-07-22 DIAGNOSIS — I5032 Chronic diastolic (congestive) heart failure: Secondary | ICD-10-CM | POA: Diagnosis not present

## 2016-07-22 DIAGNOSIS — S42301D Unspecified fracture of shaft of humerus, right arm, subsequent encounter for fracture with routine healing: Secondary | ICD-10-CM | POA: Diagnosis not present

## 2016-07-22 DIAGNOSIS — I110 Hypertensive heart disease with heart failure: Secondary | ICD-10-CM | POA: Diagnosis not present

## 2016-07-26 DIAGNOSIS — I421 Obstructive hypertrophic cardiomyopathy: Secondary | ICD-10-CM | POA: Diagnosis not present

## 2016-07-26 DIAGNOSIS — I110 Hypertensive heart disease with heart failure: Secondary | ICD-10-CM | POA: Diagnosis not present

## 2016-07-26 DIAGNOSIS — I5032 Chronic diastolic (congestive) heart failure: Secondary | ICD-10-CM | POA: Diagnosis not present

## 2016-07-26 DIAGNOSIS — I482 Chronic atrial fibrillation: Secondary | ICD-10-CM | POA: Diagnosis not present

## 2016-07-26 DIAGNOSIS — Z89511 Acquired absence of right leg below knee: Secondary | ICD-10-CM | POA: Diagnosis not present

## 2016-07-26 DIAGNOSIS — S42301D Unspecified fracture of shaft of humerus, right arm, subsequent encounter for fracture with routine healing: Secondary | ICD-10-CM | POA: Diagnosis not present

## 2016-07-27 DIAGNOSIS — I110 Hypertensive heart disease with heart failure: Secondary | ICD-10-CM | POA: Diagnosis not present

## 2016-07-27 DIAGNOSIS — I482 Chronic atrial fibrillation: Secondary | ICD-10-CM | POA: Diagnosis not present

## 2016-07-27 DIAGNOSIS — I421 Obstructive hypertrophic cardiomyopathy: Secondary | ICD-10-CM | POA: Diagnosis not present

## 2016-07-27 DIAGNOSIS — Z89511 Acquired absence of right leg below knee: Secondary | ICD-10-CM | POA: Diagnosis not present

## 2016-07-27 DIAGNOSIS — I5032 Chronic diastolic (congestive) heart failure: Secondary | ICD-10-CM | POA: Diagnosis not present

## 2016-07-27 DIAGNOSIS — S42301D Unspecified fracture of shaft of humerus, right arm, subsequent encounter for fracture with routine healing: Secondary | ICD-10-CM | POA: Diagnosis not present

## 2016-07-28 DIAGNOSIS — S42301D Unspecified fracture of shaft of humerus, right arm, subsequent encounter for fracture with routine healing: Secondary | ICD-10-CM | POA: Diagnosis not present

## 2016-07-28 DIAGNOSIS — I421 Obstructive hypertrophic cardiomyopathy: Secondary | ICD-10-CM | POA: Diagnosis not present

## 2016-07-28 DIAGNOSIS — I110 Hypertensive heart disease with heart failure: Secondary | ICD-10-CM | POA: Diagnosis not present

## 2016-07-28 DIAGNOSIS — Z89511 Acquired absence of right leg below knee: Secondary | ICD-10-CM | POA: Diagnosis not present

## 2016-07-28 DIAGNOSIS — I482 Chronic atrial fibrillation: Secondary | ICD-10-CM | POA: Diagnosis not present

## 2016-07-28 DIAGNOSIS — I5032 Chronic diastolic (congestive) heart failure: Secondary | ICD-10-CM | POA: Diagnosis not present

## 2016-07-29 DIAGNOSIS — Z89511 Acquired absence of right leg below knee: Secondary | ICD-10-CM | POA: Diagnosis not present

## 2016-07-29 DIAGNOSIS — I5032 Chronic diastolic (congestive) heart failure: Secondary | ICD-10-CM | POA: Diagnosis not present

## 2016-07-29 DIAGNOSIS — I110 Hypertensive heart disease with heart failure: Secondary | ICD-10-CM | POA: Diagnosis not present

## 2016-07-29 DIAGNOSIS — I482 Chronic atrial fibrillation: Secondary | ICD-10-CM | POA: Diagnosis not present

## 2016-07-29 DIAGNOSIS — I421 Obstructive hypertrophic cardiomyopathy: Secondary | ICD-10-CM | POA: Diagnosis not present

## 2016-07-29 DIAGNOSIS — S42301D Unspecified fracture of shaft of humerus, right arm, subsequent encounter for fracture with routine healing: Secondary | ICD-10-CM | POA: Diagnosis not present

## 2016-08-01 DIAGNOSIS — S42301D Unspecified fracture of shaft of humerus, right arm, subsequent encounter for fracture with routine healing: Secondary | ICD-10-CM | POA: Diagnosis not present

## 2016-08-01 DIAGNOSIS — Z4789 Encounter for other orthopedic aftercare: Secondary | ICD-10-CM | POA: Diagnosis not present

## 2016-08-02 DIAGNOSIS — S42301D Unspecified fracture of shaft of humerus, right arm, subsequent encounter for fracture with routine healing: Secondary | ICD-10-CM | POA: Diagnosis not present

## 2016-08-02 DIAGNOSIS — I421 Obstructive hypertrophic cardiomyopathy: Secondary | ICD-10-CM | POA: Diagnosis not present

## 2016-08-02 DIAGNOSIS — I110 Hypertensive heart disease with heart failure: Secondary | ICD-10-CM | POA: Diagnosis not present

## 2016-08-02 DIAGNOSIS — Z89511 Acquired absence of right leg below knee: Secondary | ICD-10-CM | POA: Diagnosis not present

## 2016-08-02 DIAGNOSIS — I5032 Chronic diastolic (congestive) heart failure: Secondary | ICD-10-CM | POA: Diagnosis not present

## 2016-08-02 DIAGNOSIS — I482 Chronic atrial fibrillation: Secondary | ICD-10-CM | POA: Diagnosis not present

## 2016-08-03 DIAGNOSIS — I5032 Chronic diastolic (congestive) heart failure: Secondary | ICD-10-CM | POA: Diagnosis not present

## 2016-08-03 DIAGNOSIS — I421 Obstructive hypertrophic cardiomyopathy: Secondary | ICD-10-CM | POA: Diagnosis not present

## 2016-08-03 DIAGNOSIS — I482 Chronic atrial fibrillation: Secondary | ICD-10-CM | POA: Diagnosis not present

## 2016-08-03 DIAGNOSIS — S42301D Unspecified fracture of shaft of humerus, right arm, subsequent encounter for fracture with routine healing: Secondary | ICD-10-CM | POA: Diagnosis not present

## 2016-08-03 DIAGNOSIS — I110 Hypertensive heart disease with heart failure: Secondary | ICD-10-CM | POA: Diagnosis not present

## 2016-08-03 DIAGNOSIS — Z89511 Acquired absence of right leg below knee: Secondary | ICD-10-CM | POA: Diagnosis not present

## 2016-08-04 DIAGNOSIS — Z0000 Encounter for general adult medical examination without abnormal findings: Secondary | ICD-10-CM | POA: Diagnosis not present

## 2016-08-04 DIAGNOSIS — I10 Essential (primary) hypertension: Secondary | ICD-10-CM | POA: Diagnosis not present

## 2016-08-04 DIAGNOSIS — M129 Arthropathy, unspecified: Secondary | ICD-10-CM | POA: Diagnosis not present

## 2016-08-04 DIAGNOSIS — Z89619 Acquired absence of unspecified leg above knee: Secondary | ICD-10-CM | POA: Diagnosis not present

## 2016-08-04 DIAGNOSIS — L03116 Cellulitis of left lower limb: Secondary | ICD-10-CM | POA: Diagnosis not present

## 2016-08-05 DIAGNOSIS — Z89511 Acquired absence of right leg below knee: Secondary | ICD-10-CM | POA: Diagnosis not present

## 2016-08-05 DIAGNOSIS — I110 Hypertensive heart disease with heart failure: Secondary | ICD-10-CM | POA: Diagnosis not present

## 2016-08-05 DIAGNOSIS — I421 Obstructive hypertrophic cardiomyopathy: Secondary | ICD-10-CM | POA: Diagnosis not present

## 2016-08-05 DIAGNOSIS — I482 Chronic atrial fibrillation: Secondary | ICD-10-CM | POA: Diagnosis not present

## 2016-08-05 DIAGNOSIS — I5032 Chronic diastolic (congestive) heart failure: Secondary | ICD-10-CM | POA: Diagnosis not present

## 2016-08-05 DIAGNOSIS — S42301D Unspecified fracture of shaft of humerus, right arm, subsequent encounter for fracture with routine healing: Secondary | ICD-10-CM | POA: Diagnosis not present

## 2016-08-12 DIAGNOSIS — I482 Chronic atrial fibrillation: Secondary | ICD-10-CM | POA: Diagnosis not present

## 2016-08-12 DIAGNOSIS — S42301D Unspecified fracture of shaft of humerus, right arm, subsequent encounter for fracture with routine healing: Secondary | ICD-10-CM | POA: Diagnosis not present

## 2016-08-12 DIAGNOSIS — Z89511 Acquired absence of right leg below knee: Secondary | ICD-10-CM | POA: Diagnosis not present

## 2016-08-12 DIAGNOSIS — I5032 Chronic diastolic (congestive) heart failure: Secondary | ICD-10-CM | POA: Diagnosis not present

## 2016-08-12 DIAGNOSIS — I421 Obstructive hypertrophic cardiomyopathy: Secondary | ICD-10-CM | POA: Diagnosis not present

## 2016-08-12 DIAGNOSIS — I110 Hypertensive heart disease with heart failure: Secondary | ICD-10-CM | POA: Diagnosis not present

## 2016-08-16 ENCOUNTER — Other Ambulatory Visit: Payer: Self-pay

## 2016-08-16 DIAGNOSIS — Z89511 Acquired absence of right leg below knee: Secondary | ICD-10-CM | POA: Diagnosis not present

## 2016-08-16 DIAGNOSIS — S42301D Unspecified fracture of shaft of humerus, right arm, subsequent encounter for fracture with routine healing: Secondary | ICD-10-CM | POA: Diagnosis not present

## 2016-08-16 DIAGNOSIS — I421 Obstructive hypertrophic cardiomyopathy: Secondary | ICD-10-CM | POA: Diagnosis not present

## 2016-08-16 DIAGNOSIS — I482 Chronic atrial fibrillation: Secondary | ICD-10-CM | POA: Diagnosis not present

## 2016-08-16 DIAGNOSIS — I110 Hypertensive heart disease with heart failure: Secondary | ICD-10-CM | POA: Diagnosis not present

## 2016-08-16 DIAGNOSIS — I5032 Chronic diastolic (congestive) heart failure: Secondary | ICD-10-CM | POA: Diagnosis not present

## 2016-08-17 DIAGNOSIS — I5032 Chronic diastolic (congestive) heart failure: Secondary | ICD-10-CM | POA: Diagnosis not present

## 2016-08-17 DIAGNOSIS — I482 Chronic atrial fibrillation: Secondary | ICD-10-CM | POA: Diagnosis not present

## 2016-08-17 DIAGNOSIS — S42301D Unspecified fracture of shaft of humerus, right arm, subsequent encounter for fracture with routine healing: Secondary | ICD-10-CM | POA: Diagnosis not present

## 2016-08-17 DIAGNOSIS — I110 Hypertensive heart disease with heart failure: Secondary | ICD-10-CM | POA: Diagnosis not present

## 2016-08-17 DIAGNOSIS — I421 Obstructive hypertrophic cardiomyopathy: Secondary | ICD-10-CM | POA: Diagnosis not present

## 2016-08-17 DIAGNOSIS — Z89511 Acquired absence of right leg below knee: Secondary | ICD-10-CM | POA: Diagnosis not present

## 2016-08-29 DIAGNOSIS — Z4789 Encounter for other orthopedic aftercare: Secondary | ICD-10-CM | POA: Diagnosis not present

## 2016-08-29 DIAGNOSIS — S42391D Other fracture of shaft of right humerus, subsequent encounter for fracture with routine healing: Secondary | ICD-10-CM | POA: Diagnosis not present

## 2016-09-07 DIAGNOSIS — N390 Urinary tract infection, site not specified: Secondary | ICD-10-CM | POA: Diagnosis not present

## 2016-10-05 DIAGNOSIS — H348312 Tributary (branch) retinal vein occlusion, right eye, stable: Secondary | ICD-10-CM | POA: Diagnosis not present

## 2016-10-05 DIAGNOSIS — H353132 Nonexudative age-related macular degeneration, bilateral, intermediate dry stage: Secondary | ICD-10-CM | POA: Diagnosis not present

## 2016-10-05 DIAGNOSIS — H40013 Open angle with borderline findings, low risk, bilateral: Secondary | ICD-10-CM | POA: Diagnosis not present

## 2017-01-30 DIAGNOSIS — E559 Vitamin D deficiency, unspecified: Secondary | ICD-10-CM | POA: Diagnosis not present

## 2017-01-30 DIAGNOSIS — M810 Age-related osteoporosis without current pathological fracture: Secondary | ICD-10-CM | POA: Diagnosis not present

## 2017-01-30 DIAGNOSIS — I10 Essential (primary) hypertension: Secondary | ICD-10-CM | POA: Diagnosis not present

## 2017-01-30 DIAGNOSIS — N390 Urinary tract infection, site not specified: Secondary | ICD-10-CM | POA: Diagnosis not present

## 2017-02-02 DIAGNOSIS — I10 Essential (primary) hypertension: Secondary | ICD-10-CM | POA: Diagnosis not present

## 2017-02-02 DIAGNOSIS — E039 Hypothyroidism, unspecified: Secondary | ICD-10-CM | POA: Diagnosis not present

## 2017-02-02 DIAGNOSIS — Z23 Encounter for immunization: Secondary | ICD-10-CM | POA: Diagnosis not present

## 2017-02-02 DIAGNOSIS — M810 Age-related osteoporosis without current pathological fracture: Secondary | ICD-10-CM | POA: Diagnosis not present

## 2017-02-02 DIAGNOSIS — Z0000 Encounter for general adult medical examination without abnormal findings: Secondary | ICD-10-CM | POA: Diagnosis not present

## 2017-03-06 DIAGNOSIS — H40013 Open angle with borderline findings, low risk, bilateral: Secondary | ICD-10-CM | POA: Diagnosis not present

## 2017-03-06 DIAGNOSIS — H04123 Dry eye syndrome of bilateral lacrimal glands: Secondary | ICD-10-CM | POA: Diagnosis not present

## 2017-03-06 DIAGNOSIS — H169 Unspecified keratitis: Secondary | ICD-10-CM | POA: Diagnosis not present

## 2017-03-28 DIAGNOSIS — E039 Hypothyroidism, unspecified: Secondary | ICD-10-CM | POA: Diagnosis not present

## 2017-03-28 DIAGNOSIS — Z89619 Acquired absence of unspecified leg above knee: Secondary | ICD-10-CM | POA: Diagnosis not present

## 2017-03-28 DIAGNOSIS — Z5181 Encounter for therapeutic drug level monitoring: Secondary | ICD-10-CM | POA: Diagnosis not present

## 2017-04-03 DIAGNOSIS — I10 Essential (primary) hypertension: Secondary | ICD-10-CM | POA: Diagnosis not present

## 2017-04-03 DIAGNOSIS — I4891 Unspecified atrial fibrillation: Secondary | ICD-10-CM | POA: Diagnosis not present

## 2017-04-03 DIAGNOSIS — E039 Hypothyroidism, unspecified: Secondary | ICD-10-CM | POA: Diagnosis not present

## 2017-04-03 DIAGNOSIS — Z79899 Other long term (current) drug therapy: Secondary | ICD-10-CM | POA: Diagnosis not present

## 2017-04-20 DIAGNOSIS — Z950 Presence of cardiac pacemaker: Secondary | ICD-10-CM | POA: Diagnosis not present

## 2017-05-24 DIAGNOSIS — Z89611 Acquired absence of right leg above knee: Secondary | ICD-10-CM | POA: Diagnosis not present

## 2017-05-24 DIAGNOSIS — Z4781 Encounter for orthopedic aftercare following surgical amputation: Secondary | ICD-10-CM | POA: Diagnosis not present

## 2017-06-27 ENCOUNTER — Ambulatory Visit: Payer: Medicare Other | Admitting: Hematology

## 2017-06-27 DIAGNOSIS — I10 Essential (primary) hypertension: Secondary | ICD-10-CM | POA: Diagnosis not present

## 2017-06-27 DIAGNOSIS — Z79899 Other long term (current) drug therapy: Secondary | ICD-10-CM | POA: Diagnosis not present

## 2017-06-27 DIAGNOSIS — Z5181 Encounter for therapeutic drug level monitoring: Secondary | ICD-10-CM | POA: Diagnosis not present

## 2017-06-27 DIAGNOSIS — N390 Urinary tract infection, site not specified: Secondary | ICD-10-CM | POA: Diagnosis not present

## 2017-06-27 DIAGNOSIS — E039 Hypothyroidism, unspecified: Secondary | ICD-10-CM | POA: Diagnosis not present

## 2017-07-03 DIAGNOSIS — K5900 Constipation, unspecified: Secondary | ICD-10-CM | POA: Diagnosis not present

## 2017-07-03 DIAGNOSIS — R0600 Dyspnea, unspecified: Secondary | ICD-10-CM | POA: Diagnosis not present

## 2017-07-03 DIAGNOSIS — R0609 Other forms of dyspnea: Secondary | ICD-10-CM | POA: Diagnosis not present

## 2017-07-03 DIAGNOSIS — I4891 Unspecified atrial fibrillation: Secondary | ICD-10-CM | POA: Diagnosis not present

## 2017-07-04 ENCOUNTER — Other Ambulatory Visit (HOSPITAL_COMMUNITY): Payer: Self-pay | Admitting: Respiratory Therapy

## 2017-07-04 DIAGNOSIS — R0600 Dyspnea, unspecified: Secondary | ICD-10-CM

## 2017-07-11 ENCOUNTER — Ambulatory Visit (HOSPITAL_COMMUNITY)
Admission: RE | Admit: 2017-07-11 | Discharge: 2017-07-11 | Disposition: A | Discharge status: Home / Self Care | Payer: Medicare Other | Source: Ambulatory Visit | Attending: Internal Medicine | Admitting: Internal Medicine

## 2017-07-11 DIAGNOSIS — R0600 Dyspnea, unspecified: Secondary | ICD-10-CM | POA: Diagnosis not present

## 2017-07-11 DIAGNOSIS — Z950 Presence of cardiac pacemaker: Secondary | ICD-10-CM | POA: Diagnosis not present

## 2017-07-11 DIAGNOSIS — J449 Chronic obstructive pulmonary disease, unspecified: Secondary | ICD-10-CM | POA: Insufficient documentation

## 2017-07-11 DIAGNOSIS — I5032 Chronic diastolic (congestive) heart failure: Secondary | ICD-10-CM | POA: Diagnosis not present

## 2017-07-11 DIAGNOSIS — I482 Chronic atrial fibrillation: Secondary | ICD-10-CM | POA: Diagnosis not present

## 2017-07-11 DIAGNOSIS — R0602 Shortness of breath: Secondary | ICD-10-CM | POA: Diagnosis not present

## 2017-07-11 DIAGNOSIS — Z9889 Other specified postprocedural states: Secondary | ICD-10-CM | POA: Diagnosis not present

## 2017-07-11 LAB — PULMONARY FUNCTION TEST
DL/VA % pred: 88 %
DL/VA: 3.74 ml/min/mmHg/L
DLCO unc % pred: 59 %
DLCO unc: 11.31 ml/min/mmHg
FEF 25-75 Pre: 0.65 L/sec
FEF2575-%Pred-Pre: 67 %
FEV1-%Pred-Pre: 77 %
FEV1-Pre: 1.1 L
FEV1FVC-%Pred-Pre: 95 %
FEV6-%Pred-Pre: 85 %
FEV6-Pre: 1.57 L
FEV6FVC-%Pred-Pre: 106 %
FVC-%Pred-Pre: 80 %
FVC-Pre: 1.58 L
Pre FEV1/FVC ratio: 70 %
Pre FEV6/FVC Ratio: 99 %
RV % pred: 109 %
RV: 2.48 L
TLC % pred: 92 %
TLC: 4.12 L

## 2017-08-03 DIAGNOSIS — R0602 Shortness of breath: Secondary | ICD-10-CM | POA: Diagnosis not present

## 2017-08-03 DIAGNOSIS — I421 Obstructive hypertrophic cardiomyopathy: Secondary | ICD-10-CM | POA: Diagnosis not present

## 2017-08-03 DIAGNOSIS — I270 Primary pulmonary hypertension: Secondary | ICD-10-CM | POA: Diagnosis not present

## 2017-08-08 DIAGNOSIS — I340 Nonrheumatic mitral (valve) insufficiency: Secondary | ICD-10-CM | POA: Diagnosis not present

## 2017-08-08 DIAGNOSIS — R0609 Other forms of dyspnea: Secondary | ICD-10-CM | POA: Diagnosis not present

## 2017-08-08 DIAGNOSIS — I272 Pulmonary hypertension, unspecified: Secondary | ICD-10-CM | POA: Diagnosis not present

## 2017-08-08 DIAGNOSIS — J449 Chronic obstructive pulmonary disease, unspecified: Secondary | ICD-10-CM | POA: Diagnosis not present

## 2017-08-17 DIAGNOSIS — Z45010 Encounter for checking and testing of cardiac pacemaker pulse generator [battery]: Secondary | ICD-10-CM | POA: Diagnosis not present

## 2017-08-17 DIAGNOSIS — Z950 Presence of cardiac pacemaker: Secondary | ICD-10-CM | POA: Diagnosis not present

## 2017-08-18 DIAGNOSIS — I5042 Chronic combined systolic (congestive) and diastolic (congestive) heart failure: Secondary | ICD-10-CM | POA: Diagnosis not present

## 2017-08-18 DIAGNOSIS — I482 Chronic atrial fibrillation: Secondary | ICD-10-CM | POA: Diagnosis not present

## 2017-08-18 DIAGNOSIS — R0602 Shortness of breath: Secondary | ICD-10-CM | POA: Diagnosis not present

## 2017-08-18 DIAGNOSIS — Z9889 Other specified postprocedural states: Secondary | ICD-10-CM | POA: Diagnosis not present

## 2017-09-15 DIAGNOSIS — Z23 Encounter for immunization: Secondary | ICD-10-CM | POA: Diagnosis not present

## 2017-09-21 ENCOUNTER — Institutional Professional Consult (permissible substitution): Payer: Medicare Other | Admitting: Emergency Medicine

## 2017-10-04 ENCOUNTER — Encounter: Payer: Self-pay | Admitting: Emergency Medicine

## 2017-10-04 ENCOUNTER — Ambulatory Visit (INDEPENDENT_AMBULATORY_CARE_PROVIDER_SITE_OTHER): Payer: Medicare Other | Admitting: Emergency Medicine

## 2017-10-04 DIAGNOSIS — J449 Chronic obstructive pulmonary disease, unspecified: Secondary | ICD-10-CM | POA: Insufficient documentation

## 2017-10-04 NOTE — Progress Notes (Signed)
Subjective:    Patient ID: [name], female    DOB: [birth year], 83 y.o.   MRN: [id number]  HPI 83 year old never smoker with a history of breast cancer, anemia, hypertension, hypothyroidism, hypertrophic cardiomyopathy status post myotomy ([facility]), third-degree heart block status post pacemaker. She is referred today for evaluation of COPD.  She has pulmonary function testing 07/11/17 that I have reviewed. This shows mild to moderate obstruction with curve of the flow volume loop, normal lung volumes, decreased diffusion that corrects to the normal range for alveolar volume She noticed exertional SOB about 5 months ago, happens with bathing, walking. She has occasional cough. She was started on Incruse but she only used it prn. Never used it consistently   Review of Systems  Constitutional: Negative for fever and unexpected weight change.  HENT: Positive for congestion and sneezing. Negative for dental problem, ear pain, nosebleeds, postnasal drip, rhinorrhea, sinus pressure, sore throat and trouble swallowing.   Eyes: Negative for redness and itching.  Respiratory: Positive for cough, chest tightness, shortness of breath and wheezing.   Cardiovascular: Positive for leg swelling. Negative for palpitations.  Gastrointestinal: Negative for nausea and vomiting.  Genitourinary: Positive for dysuria.  Musculoskeletal: Negative for joint swelling.  Skin: Negative for rash.  Allergic/Immunologic: Negative.  Negative for environmental allergies, food allergies and immunocompromised state.  Neurological: Negative for headaches.  Hematological: Does not bruise/bleed easily.  Psychiatric/Behavioral: Negative for dysphoric mood. The patient is not nervous/anxious.     Past Medical History:  Diagnosis Date  . Anemia   . Arthritis   . Arthritis pain   . Asthma   . Blood transfusion   . Bronchitis   . Cancer ([place])    lt breast  . Chronic atrial fibrillation ([place])   . Constipation   .  Current use of long term anticoagulation   . Cystitis   . Heart block    Complete heart block post surgical requiring pacer  . Hemorrhoids   . Humerus shaft fracture    nonsurgical, June 2017  . Hypertension    Benign Essential Hypertension    . Hypertr obst cardiomyop    Required septal myotomy  . Hypokalemia    Postoperative, improved  . Hyponatremia    Mild postoperative, improved  . Memory difficulty   . Mitral regurgitation   . Pacemaker   . Urinary incontinence      Family History  Problem Relation Age of Onset  . Heart disease Mother   . Hypertension Mother   . Pancreatic cancer Mother   . Cancer Mother        pancreatic  . Pancreatic cancer Father   . Cancer Father        pancreatic  . Diabetes Brother   . Pancreatic cancer Other   . Heart disease Brother        Enlarged heart     Social History   Social History  . Marital status: Married    Spouse name: N/A  . Number of children: N/A  . Years of education: N/A   Occupational History  . Not on file.   Social History Main Topics  . Smoking status: Never Smoker  . Smokeless tobacco: Never Used  . Alcohol use No  . Drug use: No  . Sexual activity: Not Currently   Other Topics Concern  . Not on file   Social History Narrative   Married   Two children   Husband will be assisting with care after surgery  Allergies  Allergen Reactions  . Ceftaroline Rash  . Codeine Nausea And Vomiting  . Morphine Nausea And Vomiting  . Sulfa Antibiotics Nausea And Vomiting and Nausea Only     Outpatient Medications Prior to Visit  Medication Sig Dispense Refill  . acetaminophen (TYLENOL) 500 MG tablet Take 1,000 mg by mouth every 6 (six) hours as needed for moderate pain.     Marland Kitchen albuterol (PROAIR HFA) 108 (90 BASE) MCG/ACT inhaler Inhale 2 puffs into the lungs every 6 (six) hours as needed for wheezing. 1 Inhaler 3  . albuterol (PROVENTIL) (2.5 MG/3ML) 0.083% nebulizer solution Take 3 mLs (2.5 mg total)  by nebulization every 6 (six) hours as needed for wheezing or shortness of breath. 75 mL 12  . alendronate (FOSAMAX) 70 MG tablet Take 70 mg by mouth every 7 (seven) days. Take with a full glass of water on an empty stomach. Takes on Thursday    . apixaban (ELIQUIS) 2.5 MG TABS tablet Take 1 tablet (2.5 mg total) by mouth 2 (two) times daily. 60 tablet 1  . calcium citrate-vitamin D (CITRACAL+D) 315-200 MG-UNIT per tablet Take 1 tablet by mouth 2 (two) times daily. 60 tablet 1  . cyclobenzaprine (FLEXERIL) 5 MG tablet Take 1 tablet (5 mg total) by mouth daily. (Patient taking differently: Take 5 mg by mouth daily as needed for muscle spasms. ) 30 tablet 0  . cycloSPORINE (RESTASIS) 0.05 % ophthalmic emulsion Place 1 drop into both eyes 2 (two) times daily. 0.4 mL 1  . feeding supplement, ENSURE ENLIVE, (ENSURE ENLIVE) LIQD Take 237 mLs by mouth 2 (two) times daily between meals. 237 mL 12  . fluticasone (FLONASE) 50 MCG/ACT nasal spray Place 2 sprays into both nostrils 2 (two) times daily as needed for allergies.     . furosemide (LASIX) 20 MG tablet Take 1 tablet (20 mg total) by mouth 2 (two) times daily. 60 tablet 1  . guaiFENesin-dextromethorphan (ROBITUSSIN DM) 100-10 MG/5ML syrup Take 10 mLs by mouth every 4 (four) hours as needed for cough. 118 mL 0  . mirtazapine (REMERON) 15 MG tablet Take 1 tablet (15 mg total) by mouth at bedtime. 30 tablet 1  . Multiple Vitamin (MULTIVITAMIN) tablet Take 1 tablet by mouth 2 (two) times daily.     . Oxycodone HCl 10 MG TABS Take 1 tablet (10 mg total) by mouth every 6 (six) hours as needed. 10 tablet 0  . potassium chloride SA (K-DUR,KLOR-CON) 20 MEQ tablet Take 1 tablet (20 mEq total) by mouth 2 (two) times daily. 60 tablet 1  . traMADol (ULTRAM) 50 MG tablet Take 1 tablet (50 mg total) by mouth every 6 (six) hours as needed for moderate pain. 10 tablet 0  . diltiazem (CARDIZEM CD) 180 MG 24 hr capsule Take 1 capsule (180 mg total) by mouth daily. (Patient  not taking: Reported on 10/04/2017) 30 capsule 1   No facility-administered medications prior to visit.         Objective:   Physical Exam Vitals:   10/04/17 1445  BP: 114/70  Pulse: 71  SpO2: 96%  Weight: 136 lb (61.7 kg)  Height: 5' (1.524 m)   Gen: Pleasant, well-nourished, in no distress,  normal affect, In a wheelchair  ENT: No lesions,  mouth clear,  oropharynx clear, no postnasal drip  Neck: No JVD, no stridor  Lungs: No use of accessory muscles, clear bilaterally, no wheezing  Cardiovascular: Irregular, 3/6 decrescendo to crescendo systolic murmur  Musculoskeletal: No deformities, no  cyanosis or clubbing  Neuro: alert, non focal  Skin: Warm, no lesions or rash      Assessment & Plan:  Obstructive lung disease (generalized) (HCC) Clear obstruction with curve of the flow volume loop on her pulmonary function testing. Consider fixed asthma in absence of any smoking history. Took Incruse very briefly. I would like to restart on a schedule, see if she benefits. She agrees. Will follow in approx a month to assess status.   Baltazar Apo, MD, PhD 10/04/2017, 3:09 PM [facility] [phone] or if no answer [phone]

## 2017-10-04 NOTE — Patient Instructions (Signed)
We will restart Incruse 1 inhalation once a day. Take this every day. Remember to rinse and gargle after using. Follow with Dr Lamonte Sakai in approximately 1 month to discuss how you have done on the medication

## 2017-10-04 NOTE — Assessment & Plan Note (Signed)
Clear obstruction with curve of the flow volume loop on her pulmonary function testing. Consider fixed asthma in absence of any smoking history. Took Incruse very briefly. I would like to restart on a schedule, see if she benefits. She agrees. Will follow in approx a month to assess status.

## 2017-11-08 DIAGNOSIS — I10 Essential (primary) hypertension: Secondary | ICD-10-CM | POA: Diagnosis not present

## 2017-11-08 DIAGNOSIS — E039 Hypothyroidism, unspecified: Secondary | ICD-10-CM | POA: Diagnosis not present

## 2017-11-15 DIAGNOSIS — Z79899 Other long term (current) drug therapy: Secondary | ICD-10-CM | POA: Diagnosis not present

## 2017-11-15 DIAGNOSIS — I4891 Unspecified atrial fibrillation: Secondary | ICD-10-CM | POA: Diagnosis not present

## 2017-11-15 DIAGNOSIS — J449 Chronic obstructive pulmonary disease, unspecified: Secondary | ICD-10-CM | POA: Diagnosis not present

## 2017-11-15 DIAGNOSIS — L570 Actinic keratosis: Secondary | ICD-10-CM | POA: Diagnosis not present

## 2017-11-15 DIAGNOSIS — E039 Hypothyroidism, unspecified: Secondary | ICD-10-CM | POA: Diagnosis not present

## 2017-11-16 ENCOUNTER — Encounter: Payer: Self-pay | Admitting: Emergency Medicine

## 2017-11-16 ENCOUNTER — Ambulatory Visit (INDEPENDENT_AMBULATORY_CARE_PROVIDER_SITE_OTHER): Payer: Medicare Other | Admitting: Emergency Medicine

## 2017-11-16 DIAGNOSIS — J449 Chronic obstructive pulmonary disease, unspecified: Secondary | ICD-10-CM

## 2017-11-16 MED ORDER — UMECLIDINIUM BROMIDE 62.5 MCG/INH IN AEPB: 1.0000 | INHALATION_SPRAY | Freq: Every day | RESPIRATORY_TRACT | 3 refills | Status: DC

## 2017-11-16 NOTE — Assessment & Plan Note (Signed)
COPD by spirometry.  She is clinically improved on Incruse and we will continue this.  Also has albuterol that she can use as needed.  We discussed the proper timing and technique.  Flu shot is up-to-date.  I will follow with her in 6 months or sooner if she has problems

## 2017-11-16 NOTE — Patient Instructions (Signed)
Please continue Incruse once a day. We will refill this medication today. Please continue to keep albuterol available to use 2 puffs as needed for shortness of breath.  You may want to try taking this before significant exertion like showering in the evening. Flu shot up-to-date Follow with Dr Lamonte Sakai in 6 months or sooner if you have any problems

## 2017-11-16 NOTE — Progress Notes (Signed)
Subjective:    Patient ID: [name], female    DOB: 1931-04-18, 83 y.o.   MRN: [id number]  HPI 83 year old never smoker with a history of breast cancer, anemia, hypertension, hypothyroidism, hypertrophic cardiomyopathy status post myotomy ([facility]), third-degree heart block status post pacemaker. She is referred today for evaluation of COPD.  She has pulmonary function testing 07/11/17 that I have reviewed. This shows mild to moderate obstruction with curve of the flow volume loop, normal lung volumes, decreased diffusion that corrects to the normal range for alveolar volume She noticed exertional SOB about 5 months ago, happens with bathing, walking. She has occasional cough. She was started on Incruse but she only used it prn. Never used it consistently  ROV 11/16/17 --this is a follow-up visit for dyspnea and COPD.  She has a history of breast cancer, hypertension, hypothyroidism, hypertrophic cardiomyopathy status post myotomy, pacemaker for third-degree heart block.  Based on her dyspnea and obstructive lung disease on pulmonary function testing we decided to start her on scheduled Incruse at her last visit. She believes that she is breathing better, has more energy. Sometimes gets tired in the evening with showering, etc. Rare albuterol use. She is having am congestion, not currently on her fluticasone.    Review of Systems  Constitutional: Negative for fever and unexpected weight change.  HENT: Positive for congestion and sneezing. Negative for dental problem, ear pain, nosebleeds, postnasal drip, rhinorrhea, sinus pressure, sore throat and trouble swallowing.   Eyes: Negative for redness and itching.  Respiratory: Positive for cough, chest tightness, shortness of breath and wheezing.   Cardiovascular: Positive for leg swelling. Negative for palpitations.  Gastrointestinal: Negative for nausea and vomiting.  Genitourinary: Positive for dysuria.  Musculoskeletal: Negative for joint  swelling.  Skin: Negative for rash.  Allergic/Immunologic: Negative.  Negative for environmental allergies, food allergies and immunocompromised state.  Neurological: Negative for headaches.  Hematological: Does not bruise/bleed easily.  Psychiatric/Behavioral: Negative for dysphoric mood. The patient is not nervous/anxious.     Past Medical History:  Diagnosis Date  . Anemia   . Arthritis   . Arthritis pain   . Asthma   . Blood transfusion   . Bronchitis   . Cancer ([place])    lt breast  . Chronic atrial fibrillation ([place])   . Constipation   . Current use of long term anticoagulation   . Cystitis   . Heart block    Complete heart block post surgical requiring pacer  . Hemorrhoids   . Humerus shaft fracture    nonsurgical, June 2017  . Hypertension    Benign Essential Hypertension    . Hypertr obst cardiomyop    Required septal myotomy  . Hypokalemia    Postoperative, improved  . Hyponatremia    Mild postoperative, improved  . Memory difficulty   . Mitral regurgitation   . Pacemaker   . Urinary incontinence      Family History  Problem Relation Age of Onset  . Heart disease Mother   . Hypertension Mother   . Pancreatic cancer Mother   . Cancer Mother        pancreatic  . Pancreatic cancer Father   . Cancer Father        pancreatic  . Diabetes Brother   . Pancreatic cancer Other   . Heart disease Brother        Enlarged heart     Social History   Socioeconomic History  . Marital status: Married  Spouse name: Not on file  . Number of children: Not on file  . Years of education: Not on file  . Highest education level: Not on file  Social Needs  . Financial resource strain: Not on file  . Food insecurity - worry: Not on file  . Food insecurity - inability: Not on file  . Transportation needs - medical: Not on file  . Transportation needs - non-medical: Not on file  Occupational History  . Not on file  Tobacco Use  . Smoking status: Never Smoker  .  Smokeless tobacco: Never Used  Substance and Sexual Activity  . Alcohol use: No  . Drug use: No  . Sexual activity: Not Currently  Other Topics Concern  . Not on file  Social History Narrative   Married   Two children   Husband will be assisting with care after surgery     Allergies  Allergen Reactions  . Ceftaroline Rash  . Codeine Nausea And Vomiting  . Morphine Nausea And Vomiting  . Sulfa Antibiotics Nausea And Vomiting and Nausea Only     Outpatient Medications Prior to Visit  Medication Sig Dispense Refill  . acetaminophen (TYLENOL) 500 MG tablet Take 1,000 mg by mouth every 6 (six) hours as needed for moderate pain.     Marland Kitchen albuterol (PROAIR HFA) 108 (90 BASE) MCG/ACT inhaler Inhale 2 puffs into the lungs every 6 (six) hours as needed for wheezing. 1 Inhaler 3  . albuterol (PROVENTIL) (2.5 MG/3ML) 0.083% nebulizer solution Take 3 mLs (2.5 mg total) by nebulization every 6 (six) hours as needed for wheezing or shortness of breath. 75 mL 12  . alendronate (FOSAMAX) 70 MG tablet Take 70 mg by mouth every 7 (seven) days. Take with a full glass of water on an empty stomach. Takes on Thursday    . apixaban (ELIQUIS) 2.5 MG TABS tablet Take 1 tablet (2.5 mg total) by mouth 2 (two) times daily. 60 tablet 1  . calcium citrate-vitamin D (CITRACAL+D) 315-200 MG-UNIT per tablet Take 1 tablet by mouth 2 (two) times daily. 60 tablet 1  . cyclobenzaprine (FLEXERIL) 5 MG tablet Take 1 tablet (5 mg total) by mouth daily. (Patient taking differently: Take 5 mg by mouth daily as needed for muscle spasms. ) 30 tablet 0  . cycloSPORINE (RESTASIS) 0.05 % ophthalmic emulsion Place 1 drop into both eyes 2 (two) times daily. 0.4 mL 1  . feeding supplement, ENSURE ENLIVE, (ENSURE ENLIVE) LIQD Take 237 mLs by mouth 2 (two) times daily between meals. 237 mL 12  . fluticasone (FLONASE) 50 MCG/ACT nasal spray Place 2 sprays into both nostrils 2 (two) times daily as needed for allergies.     . furosemide  (LASIX) 20 MG tablet Take 1 tablet (20 mg total) by mouth 2 (two) times daily. 60 tablet 1  . guaiFENesin-dextromethorphan (ROBITUSSIN DM) 100-10 MG/5ML syrup Take 10 mLs by mouth every 4 (four) hours as needed for cough. 118 mL 0  . mirtazapine (REMERON) 15 MG tablet Take 1 tablet (15 mg total) by mouth at bedtime. 30 tablet 1  . Multiple Vitamin (MULTIVITAMIN) tablet Take 1 tablet by mouth 2 (two) times daily.     . Oxycodone HCl 10 MG TABS Take 1 tablet (10 mg total) by mouth every 6 (six) hours as needed. 10 tablet 0  . potassium chloride SA (K-DUR,KLOR-CON) 20 MEQ tablet Take 1 tablet (20 mEq total) by mouth 2 (two) times daily. 60 tablet 1  . traMADol (ULTRAM) 50  MG tablet Take 1 tablet (50 mg total) by mouth every 6 (six) hours as needed for moderate pain. 10 tablet 0  . umeclidinium bromide (INCRUSE ELLIPTA) 62.5 MCG/INH AEPB Inhale 1 puff into the lungs daily.    Marland Kitchen diltiazem (CARDIZEM CD) 180 MG 24 hr capsule Take 1 capsule (180 mg total) by mouth daily. (Patient not taking: Reported on 10/04/2017) 30 capsule 1   No facility-administered medications prior to visit.         Objective:   Physical Exam Vitals:   11/16/17 1342 11/16/17 1343  BP:  110/68  Pulse:  91  SpO2:  90%  Weight: 132 lb (59.9 kg)   Height: 5' (1.524 m)    Gen: Pleasant, well-nourished, in no distress,  normal affect, In a wheelchair  ENT: No lesions,  mouth clear,  oropharynx clear, no postnasal drip  Neck: No JVD, no stridor  Lungs: No use of accessory muscles, clear bilaterally, no wheezing  Cardiovascular: Irregular, 3/6 decrescendo to crescendo systolic murmur  Musculoskeletal: No deformities, no cyanosis or clubbing  Neuro: alert, non focal  Skin: Warm, no lesions or rash      Assessment & Plan:  Obstructive lung disease (generalized) ([place]) COPD by spirometry.  She is clinically improved on Incruse and we will continue this.  Also has albuterol that she can use as needed.  We discussed the  proper timing and technique.  Flu shot is up-to-date.  I will follow with her in 6 months or sooner if she has problems  Baltazar Apo, MD, PhD 11/16/2017, 2:03 PM [facility] [phone] or if no answer [phone]

## 2017-11-22 DIAGNOSIS — I482 Chronic atrial fibrillation: Secondary | ICD-10-CM | POA: Diagnosis not present

## 2017-11-22 DIAGNOSIS — R0602 Shortness of breath: Secondary | ICD-10-CM | POA: Diagnosis not present

## 2017-11-22 DIAGNOSIS — Z9889 Other specified postprocedural states: Secondary | ICD-10-CM | POA: Diagnosis not present

## 2017-11-22 DIAGNOSIS — I5042 Chronic combined systolic (congestive) and diastolic (congestive) heart failure: Secondary | ICD-10-CM | POA: Diagnosis not present

## 2018-01-02 DIAGNOSIS — Z8679 Personal history of other diseases of the circulatory system: Secondary | ICD-10-CM | POA: Diagnosis not present

## 2018-01-02 DIAGNOSIS — Z950 Presence of cardiac pacemaker: Secondary | ICD-10-CM | POA: Diagnosis not present

## 2018-01-02 DIAGNOSIS — Z45018 Encounter for adjustment and management of other part of cardiac pacemaker: Secondary | ICD-10-CM | POA: Diagnosis not present

## 2018-01-24 DIAGNOSIS — Z950 Presence of cardiac pacemaker: Secondary | ICD-10-CM | POA: Diagnosis not present

## 2018-01-24 DIAGNOSIS — Z45018 Encounter for adjustment and management of other part of cardiac pacemaker: Secondary | ICD-10-CM | POA: Diagnosis not present

## 2018-02-05 DIAGNOSIS — R109 Unspecified abdominal pain: Secondary | ICD-10-CM | POA: Diagnosis not present

## 2018-02-05 DIAGNOSIS — Z800 Family history of malignant neoplasm of digestive organs: Secondary | ICD-10-CM | POA: Diagnosis not present

## 2018-02-05 DIAGNOSIS — C50911 Malignant neoplasm of unspecified site of right female breast: Secondary | ICD-10-CM | POA: Diagnosis not present

## 2018-02-05 DIAGNOSIS — R1310 Dysphagia, unspecified: Secondary | ICD-10-CM | POA: Diagnosis not present

## 2018-02-05 DIAGNOSIS — R1900 Intra-abdominal and pelvic swelling, mass and lump, unspecified site: Secondary | ICD-10-CM | POA: Diagnosis not present

## 2018-02-05 DIAGNOSIS — R3915 Urgency of urination: Secondary | ICD-10-CM | POA: Diagnosis not present

## 2018-02-05 DIAGNOSIS — Z853 Personal history of malignant neoplasm of breast: Secondary | ICD-10-CM | POA: Diagnosis not present

## 2018-02-15 DIAGNOSIS — I4891 Unspecified atrial fibrillation: Secondary | ICD-10-CM | POA: Diagnosis not present

## 2018-02-15 DIAGNOSIS — Z5181 Encounter for therapeutic drug level monitoring: Secondary | ICD-10-CM | POA: Diagnosis not present

## 2018-02-15 DIAGNOSIS — E039 Hypothyroidism, unspecified: Secondary | ICD-10-CM | POA: Diagnosis not present

## 2018-02-15 DIAGNOSIS — Z79899 Other long term (current) drug therapy: Secondary | ICD-10-CM | POA: Diagnosis not present

## 2018-02-22 DIAGNOSIS — R2681 Unsteadiness on feet: Secondary | ICD-10-CM | POA: Diagnosis not present

## 2018-02-22 DIAGNOSIS — Z89619 Acquired absence of unspecified leg above knee: Secondary | ICD-10-CM | POA: Diagnosis not present

## 2018-02-22 DIAGNOSIS — Z5181 Encounter for therapeutic drug level monitoring: Secondary | ICD-10-CM | POA: Diagnosis not present

## 2018-02-22 DIAGNOSIS — Z79899 Other long term (current) drug therapy: Secondary | ICD-10-CM | POA: Diagnosis not present

## 2018-02-22 DIAGNOSIS — E039 Hypothyroidism, unspecified: Secondary | ICD-10-CM | POA: Diagnosis not present

## 2018-02-22 DIAGNOSIS — I10 Essential (primary) hypertension: Secondary | ICD-10-CM | POA: Diagnosis not present

## 2018-03-15 DIAGNOSIS — H348312 Tributary (branch) retinal vein occlusion, right eye, stable: Secondary | ICD-10-CM | POA: Diagnosis not present

## 2018-03-15 DIAGNOSIS — H26492 Other secondary cataract, left eye: Secondary | ICD-10-CM | POA: Diagnosis not present

## 2018-03-15 DIAGNOSIS — H353132 Nonexudative age-related macular degeneration, bilateral, intermediate dry stage: Secondary | ICD-10-CM | POA: Diagnosis not present

## 2018-03-15 DIAGNOSIS — H40013 Open angle with borderline findings, low risk, bilateral: Secondary | ICD-10-CM | POA: Diagnosis not present

## 2018-04-06 ENCOUNTER — Inpatient Hospital Stay (HOSPITAL_COMMUNITY)
Admission: EM | Admit: 2018-04-06 | Discharge: 2018-04-11 | DRG: 480 | Disposition: A | Discharge status: Skilled Nursing Facility | Payer: Medicare Other | Attending: Internal Medicine | Admitting: Internal Medicine

## 2018-04-06 ENCOUNTER — Emergency Department (HOSPITAL_COMMUNITY): Payer: Medicare Other

## 2018-04-06 ENCOUNTER — Encounter (HOSPITAL_COMMUNITY): Payer: Self-pay | Admitting: Family Medicine

## 2018-04-06 DIAGNOSIS — S42309A Unspecified fracture of shaft of humerus, unspecified arm, initial encounter for closed fracture: Secondary | ICD-10-CM | POA: Diagnosis present

## 2018-04-06 DIAGNOSIS — Z7983 Long term (current) use of bisphosphonates: Secondary | ICD-10-CM | POA: Diagnosis not present

## 2018-04-06 DIAGNOSIS — R079 Chest pain, unspecified: Secondary | ICD-10-CM | POA: Diagnosis not present

## 2018-04-06 DIAGNOSIS — J449 Chronic obstructive pulmonary disease, unspecified: Secondary | ICD-10-CM | POA: Diagnosis present

## 2018-04-06 DIAGNOSIS — S52021A Displaced fracture of olecranon process without intraarticular extension of right ulna, initial encounter for closed fracture: Principal | ICD-10-CM | POA: Diagnosis present

## 2018-04-06 DIAGNOSIS — S52023A Displaced fracture of olecranon process without intraarticular extension of unspecified ulna, initial encounter for closed fracture: Secondary | ICD-10-CM

## 2018-04-06 DIAGNOSIS — R51 Headache: Secondary | ICD-10-CM | POA: Diagnosis present

## 2018-04-06 DIAGNOSIS — S72011A Unspecified intracapsular fracture of right femur, initial encounter for closed fracture: Secondary | ICD-10-CM | POA: Diagnosis present

## 2018-04-06 DIAGNOSIS — S0003XA Contusion of scalp, initial encounter: Secondary | ICD-10-CM | POA: Diagnosis present

## 2018-04-06 DIAGNOSIS — Z79890 Hormone replacement therapy (postmenopausal): Secondary | ICD-10-CM | POA: Diagnosis not present

## 2018-04-06 DIAGNOSIS — S59901A Unspecified injury of right elbow, initial encounter: Secondary | ICD-10-CM | POA: Diagnosis not present

## 2018-04-06 DIAGNOSIS — T148XXA Other injury of unspecified body region, initial encounter: Secondary | ICD-10-CM | POA: Diagnosis not present

## 2018-04-06 DIAGNOSIS — Z79899 Other long term (current) drug therapy: Secondary | ICD-10-CM

## 2018-04-06 DIAGNOSIS — I4891 Unspecified atrial fibrillation: Secondary | ICD-10-CM | POA: Diagnosis not present

## 2018-04-06 DIAGNOSIS — M542 Cervicalgia: Secondary | ICD-10-CM | POA: Diagnosis not present

## 2018-04-06 DIAGNOSIS — M25572 Pain in left ankle and joints of left foot: Secondary | ICD-10-CM | POA: Diagnosis not present

## 2018-04-06 DIAGNOSIS — Z79891 Long term (current) use of opiate analgesic: Secondary | ICD-10-CM | POA: Diagnosis not present

## 2018-04-06 DIAGNOSIS — Z4789 Encounter for other orthopedic aftercare: Secondary | ICD-10-CM | POA: Diagnosis not present

## 2018-04-06 DIAGNOSIS — M6281 Muscle weakness (generalized): Secondary | ICD-10-CM | POA: Diagnosis not present

## 2018-04-06 DIAGNOSIS — S72009D Fracture of unspecified part of neck of unspecified femur, subsequent encounter for closed fracture with routine healing: Secondary | ICD-10-CM | POA: Diagnosis not present

## 2018-04-06 DIAGNOSIS — Y9289 Other specified places as the place of occurrence of the external cause: Secondary | ICD-10-CM

## 2018-04-06 DIAGNOSIS — I10 Essential (primary) hypertension: Secondary | ICD-10-CM | POA: Diagnosis present

## 2018-04-06 DIAGNOSIS — S728X1A Other fracture of right femur, initial encounter for closed fracture: Secondary | ICD-10-CM | POA: Diagnosis not present

## 2018-04-06 DIAGNOSIS — S199XXA Unspecified injury of neck, initial encounter: Secondary | ICD-10-CM | POA: Diagnosis not present

## 2018-04-06 DIAGNOSIS — S72009A Fracture of unspecified part of neck of unspecified femur, initial encounter for closed fracture: Secondary | ICD-10-CM | POA: Diagnosis present

## 2018-04-06 DIAGNOSIS — R2681 Unsteadiness on feet: Secondary | ICD-10-CM | POA: Diagnosis not present

## 2018-04-06 DIAGNOSIS — Z950 Presence of cardiac pacemaker: Secondary | ICD-10-CM | POA: Diagnosis not present

## 2018-04-06 DIAGNOSIS — I480 Paroxysmal atrial fibrillation: Secondary | ICD-10-CM | POA: Diagnosis present

## 2018-04-06 DIAGNOSIS — Z89611 Acquired absence of right leg above knee: Secondary | ICD-10-CM

## 2018-04-06 DIAGNOSIS — R278 Other lack of coordination: Secondary | ICD-10-CM | POA: Diagnosis not present

## 2018-04-06 DIAGNOSIS — R52 Pain, unspecified: Secondary | ICD-10-CM

## 2018-04-06 DIAGNOSIS — S72041A Displaced fracture of base of neck of right femur, initial encounter for closed fracture: Secondary | ICD-10-CM | POA: Diagnosis not present

## 2018-04-06 DIAGNOSIS — E039 Hypothyroidism, unspecified: Secondary | ICD-10-CM | POA: Diagnosis present

## 2018-04-06 DIAGNOSIS — S52021D Displaced fracture of olecranon process without intraarticular extension of right ulna, subsequent encounter for closed fracture with routine healing: Secondary | ICD-10-CM | POA: Diagnosis not present

## 2018-04-06 DIAGNOSIS — M25511 Pain in right shoulder: Secondary | ICD-10-CM | POA: Diagnosis not present

## 2018-04-06 DIAGNOSIS — S299XXA Unspecified injury of thorax, initial encounter: Secondary | ICD-10-CM | POA: Diagnosis not present

## 2018-04-06 DIAGNOSIS — Z7901 Long term (current) use of anticoagulants: Secondary | ICD-10-CM

## 2018-04-06 DIAGNOSIS — S72001D Fracture of unspecified part of neck of right femur, subsequent encounter for closed fracture with routine healing: Secondary | ICD-10-CM | POA: Diagnosis not present

## 2018-04-06 DIAGNOSIS — S72001A Fracture of unspecified part of neck of right femur, initial encounter for closed fracture: Secondary | ICD-10-CM

## 2018-04-06 DIAGNOSIS — I447 Left bundle-branch block, unspecified: Secondary | ICD-10-CM | POA: Diagnosis not present

## 2018-04-06 DIAGNOSIS — S52001A Unspecified fracture of upper end of right ulna, initial encounter for closed fracture: Secondary | ICD-10-CM | POA: Diagnosis not present

## 2018-04-06 DIAGNOSIS — G8918 Other acute postprocedural pain: Secondary | ICD-10-CM | POA: Diagnosis not present

## 2018-04-06 DIAGNOSIS — R41841 Cognitive communication deficit: Secondary | ICD-10-CM | POA: Diagnosis not present

## 2018-04-06 DIAGNOSIS — M79605 Pain in left leg: Secondary | ICD-10-CM | POA: Diagnosis not present

## 2018-04-06 LAB — CBC WITH DIFFERENTIAL/PLATELET
Basophils Absolute: 0 10*3/uL (ref 0.0–0.1)
Basophils Relative: 0 %
Eosinophils Absolute: 0 10*3/uL (ref 0.0–0.7)
Eosinophils Relative: 0 %
HCT: 39.7 % (ref 36.0–46.0)
Hemoglobin: 13 g/dL (ref 12.0–15.0)
Lymphocytes Relative: 4 %
Lymphs Abs: 0.6 10*3/uL — ABNORMAL LOW (ref 0.7–4.0)
MCH: 32.3 pg (ref 26.0–34.0)
MCHC: 32.7 g/dL (ref 30.0–36.0)
MCV: 98.5 fL (ref 78.0–100.0)
Monocytes Absolute: 0.9 10*3/uL (ref 0.1–1.0)
Monocytes Relative: 6 %
Neutro Abs: 13.4 10*3/uL — ABNORMAL HIGH (ref 1.7–7.7)
Neutrophils Relative %: 90 %
Platelets: 170 10*3/uL (ref 150–400)
RBC: 4.03 MIL/uL (ref 3.87–5.11)
RDW: 14.7 % (ref 11.5–15.5)
WBC: 14.9 10*3/uL — ABNORMAL HIGH (ref 4.0–10.5)

## 2018-04-06 LAB — BPAM FFP
Blood Product Expiration Date: 201904222359
Blood Product Expiration Date: 201904232359
ISSUE DATE / TIME: 201904191420
ISSUE DATE / TIME: 201904191420
Unit Type and Rh: 6200
Unit Type and Rh: 6200

## 2018-04-06 LAB — COMPREHENSIVE METABOLIC PANEL
ALT: 15 U/L (ref 14–54)
AST: 31 U/L (ref 15–41)
Albumin: 3.5 g/dL (ref 3.5–5.0)
Alkaline Phosphatase: 62 U/L (ref 38–126)
Anion gap: 12 (ref 5–15)
BUN: 19 mg/dL (ref 6–20)
CO2: 23 mmol/L (ref 22–32)
Calcium: 8.9 mg/dL (ref 8.9–10.3)
Chloride: 104 mmol/L (ref 101–111)
Creatinine, Ser: 1.03 mg/dL — ABNORMAL HIGH (ref 0.44–1.00)
GFR calc Af Amer: 56 mL/min — ABNORMAL LOW (ref >60)
GFR calc non Af Amer: 49 mL/min — ABNORMAL LOW (ref >60)
Glucose, Bld: 122 mg/dL — ABNORMAL HIGH (ref 65–99)
Potassium: 3.6 mmol/L (ref 3.5–5.1)
Sodium: 139 mmol/L (ref 135–145)
Total Bilirubin: 1.1 mg/dL (ref 0.3–1.2)
Total Protein: 6.3 g/dL — ABNORMAL LOW (ref 6.5–8.1)

## 2018-04-06 LAB — BPAM RBC
Blood Product Expiration Date: 201905072359
Blood Product Expiration Date: 201905082359
ISSUE DATE / TIME: 201904191419
ISSUE DATE / TIME: 201904191419
Unit Type and Rh: 9500
Unit Type and Rh: 9500

## 2018-04-06 LAB — PREPARE FRESH FROZEN PLASMA
Unit division: 0
Unit division: 0

## 2018-04-06 LAB — PROTIME-INR
INR: 1.39
Prothrombin Time: 17 seconds — ABNORMAL HIGH (ref 11.4–15.2)

## 2018-04-06 MED ORDER — LEVOTHYROXINE SODIUM 25 MCG PO TABS
25.0000 ug | ORAL_TABLET | Freq: Every day | ORAL | Status: DC
  Administered 2018-04-07 – 2018-04-11 (×4): 25 ug via ORAL
  Filled 2018-04-06 (×4): qty 1

## 2018-04-06 MED ORDER — METHOCARBAMOL 500 MG PO TABS
500.0000 mg | ORAL_TABLET | Freq: Three times a day (TID) | ORAL | Status: DC
  Administered 2018-04-07 – 2018-04-11 (×13): 500 mg via ORAL
  Filled 2018-04-06 (×14): qty 1

## 2018-04-06 MED ORDER — TRAZODONE HCL 50 MG PO TABS: 50.0000 mg | ORAL_TABLET | Freq: Every evening | ORAL | Status: DC | PRN

## 2018-04-06 MED ORDER — SENNA 8.6 MG PO TABS
1.0000 | ORAL_TABLET | Freq: Two times a day (BID) | ORAL | Status: DC
  Administered 2018-04-07 – 2018-04-11 (×8): 8.6 mg via ORAL
  Filled 2018-04-06 (×8): qty 1

## 2018-04-06 MED ORDER — HYDROMORPHONE HCL 2 MG/ML IJ SOLN
0.5000 mg | INTRAMUSCULAR | Status: DC | PRN
  Administered 2018-04-09 – 2018-04-10 (×5): 0.5 mg via INTRAVENOUS
  Filled 2018-04-06 (×6): qty 1

## 2018-04-06 MED ORDER — OXYCODONE HCL 5 MG PO TABS
5.0000 mg | ORAL_TABLET | ORAL | Status: DC | PRN
Start: 2018-04-06 — End: 2018-04-11
  Administered 2018-04-07 – 2018-04-11 (×11): 5 mg via ORAL
  Filled 2018-04-06 (×12): qty 1

## 2018-04-06 MED ORDER — ACETAMINOPHEN 650 MG RE SUPP: 650.0000 mg | Freq: Four times a day (QID) | RECTAL | Status: DC | PRN

## 2018-04-06 MED ORDER — ACETAMINOPHEN 325 MG PO TABS: 650.0000 mg | ORAL_TABLET | Freq: Four times a day (QID) | ORAL | Status: DC | PRN

## 2018-04-06 MED ORDER — ONDANSETRON HCL 4 MG PO TABS
4.0000 mg | ORAL_TABLET | Freq: Four times a day (QID) | ORAL | Status: DC | PRN
  Filled 2018-04-06: qty 1

## 2018-04-06 MED ORDER — ONDANSETRON HCL 4 MG/2ML IJ SOLN
4.0000 mg | Freq: Once | INTRAMUSCULAR | Status: AC
  Administered 2018-04-06: 4 mg via INTRAVENOUS
  Filled 2018-04-06: qty 2

## 2018-04-06 MED ORDER — ALBUTEROL SULFATE (2.5 MG/3ML) 0.083% IN NEBU
2.5000 mg | INHALATION_SOLUTION | RESPIRATORY_TRACT | Status: DC | PRN
  Administered 2018-04-11: 2.5 mg via RESPIRATORY_TRACT
  Filled 2018-04-06: qty 3

## 2018-04-06 MED ORDER — UMECLIDINIUM BROMIDE 62.5 MCG/INH IN AEPB
1.0000 | INHALATION_SPRAY | Freq: Every day | RESPIRATORY_TRACT | Status: DC
  Administered 2018-04-07 – 2018-04-11 (×4): 1 via RESPIRATORY_TRACT
  Filled 2018-04-06: qty 7

## 2018-04-06 MED ORDER — HYDRALAZINE HCL 25 MG PO TABS
25.0000 mg | ORAL_TABLET | Freq: Three times a day (TID) | ORAL | Status: DC
  Administered 2018-04-10 – 2018-04-11 (×2): 25 mg via ORAL
  Filled 2018-04-06 (×6): qty 1

## 2018-04-06 MED ORDER — ALENDRONATE SODIUM 70 MG PO TABS: 70.0000 mg | ORAL_TABLET | ORAL | Status: DC

## 2018-04-06 MED ORDER — ADULT MULTIVITAMIN W/MINERALS CH
1.0000 | ORAL_TABLET | Freq: Every day | ORAL | Status: DC
  Administered 2018-04-07 – 2018-04-11 (×4): 1 via ORAL
  Filled 2018-04-06 (×4): qty 1

## 2018-04-06 MED ORDER — SODIUM CHLORIDE 0.9% FLUSH
3.0000 mL | INTRAVENOUS | Status: DC | PRN
  Administered 2018-04-10: 3 mL via INTRAVENOUS
  Filled 2018-04-06 (×2): qty 3

## 2018-04-06 MED ORDER — ACETAMINOPHEN 500 MG PO TABS
1000.0000 mg | ORAL_TABLET | Freq: Four times a day (QID) | ORAL | Status: DC | PRN
  Administered 2018-04-07 – 2018-04-09 (×3): 1000 mg via ORAL
  Filled 2018-04-06 (×3): qty 2

## 2018-04-06 MED ORDER — HEPARIN SODIUM (PORCINE) 5000 UNIT/ML IJ SOLN
5000.0000 [IU] | Freq: Three times a day (TID) | INTRAMUSCULAR | Status: DC
  Administered 2018-04-07 – 2018-04-11 (×13): 5000 [IU] via SUBCUTANEOUS
  Filled 2018-04-06 (×14): qty 1

## 2018-04-06 MED ORDER — FENTANYL CITRATE (PF) 100 MCG/2ML IJ SOLN
50.0000 ug | INTRAMUSCULAR | Status: DC | PRN
  Administered 2018-04-06 – 2018-04-09 (×4): 50 ug via INTRAVENOUS
  Filled 2018-04-06 (×4): qty 2

## 2018-04-06 MED ORDER — METOPROLOL SUCCINATE ER 100 MG PO TB24
100.0000 mg | ORAL_TABLET | Freq: Every day | ORAL | Status: DC
  Administered 2018-04-07 – 2018-04-11 (×2): 100 mg via ORAL
  Filled 2018-04-06 (×2): qty 1

## 2018-04-06 MED ORDER — ONDANSETRON HCL 4 MG/2ML IJ SOLN
4.0000 mg | Freq: Four times a day (QID) | INTRAMUSCULAR | Status: DC | PRN
  Filled 2018-04-06 (×2): qty 2

## 2018-04-06 MED ORDER — POLYETHYLENE GLYCOL 3350 17 G PO PACK
17.0000 g | PACK | Freq: Every day | ORAL | Status: DC
  Administered 2018-04-07 – 2018-04-11 (×3): 17 g via ORAL
  Filled 2018-04-06 (×3): qty 1

## 2018-04-06 MED ORDER — SODIUM CHLORIDE 0.9% FLUSH
3.0000 mL | Freq: Two times a day (BID) | INTRAVENOUS | Status: DC
  Administered 2018-04-07 – 2018-04-11 (×5): 3 mL via INTRAVENOUS

## 2018-04-06 MED ORDER — POLYETHYLENE GLYCOL 3350 17 G PO PACK: 17.0000 g | PACK | Freq: Every day | ORAL | Status: DC | PRN

## 2018-04-06 MED ORDER — SODIUM CHLORIDE 0.9 % IV SOLN: 250.0000 mL | INTRAVENOUS | Status: DC | PRN
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
## 2018-04-06 NOTE — ED Notes (Signed)
[name]

## 2018-04-06 NOTE — ED Notes (Signed)
Paged hand/THOMPSON

## 2018-04-06 NOTE — ED Notes (Addendum)
No admitting orders yet, admitting MD has already seen pt. Text page sent for bed request.

## 2018-04-06 NOTE — ED Notes (Signed)
Attempted report 
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
## 2018-04-06 NOTE — Progress Notes (Signed)
Orthopedic Tech Progress Note Patient Details:  [name] Jan 28, 1931 [id number]  Ortho Devices Type of Ortho Device: Ace wrap, Post (long arm) splint, Arm sling Ortho Device/Splint Location: RUE Ortho Device/Splint Interventions: Ordered, Application   Post Interventions Patient Tolerated: Well Instructions Provided: Care of device   Braulio Bosch 04/06/2018, 7:29 PM
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
## 2018-04-06 NOTE — H&P (Signed)
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 encounter. EXAM: RIGHT FEMUR 2 VIEWS COMPARISON:  None. FINDINGS: The right lower extremity has been amputated at the knee. A distal femoral prosthesis is in place. There is no lucency about the femoral stem to suggest loosening or infection. No periprosthetic fracture is identified. The right hip appears located. Surgical clips are present in the distal thigh. IMPRESSION: Distal right femoral prosthesis without acute osseous abnormality identified. Electronically Signed   By: Logan Bores M.D.   On: 04/06/2018 19:28    DVT Prophylaxis -SCD   AM Labs Ordered, also please review Full Orders  Family Communication: Admission, patients condition and plan of care  including tests being ordered have been discussed with the patient who indicate understanding and agree with the plan   Code Status - Full Code  Likely DC to  TBD   Condition   stable  Roxan Hockey M.D on 04/07/2018 at 2:21 AM   Between 7am to 7pm - Pager - [phone] After 7pm go to [email or web address] - password [email or web address]  [facility] - Office  [phone]  Voice Recognition Viviann Spare dictation system was used to create this note, attempts have been made to correct errors. Please contact the author with questions and/or clarifications.

## 2018-04-07 ENCOUNTER — Encounter (HOSPITAL_COMMUNITY): Payer: Self-pay | Admitting: Family Medicine

## 2018-04-07 DIAGNOSIS — S52023A Displaced fracture of olecranon process without intraarticular extension of unspecified ulna, initial encounter for closed fracture: Secondary | ICD-10-CM | POA: Diagnosis present

## 2018-04-07 DIAGNOSIS — I10 Essential (primary) hypertension: Secondary | ICD-10-CM | POA: Diagnosis present

## 2018-04-07 DIAGNOSIS — S72001A Fracture of unspecified part of neck of right femur, initial encounter for closed fracture: Secondary | ICD-10-CM

## 2018-04-07 LAB — BASIC METABOLIC PANEL
Anion gap: 11 (ref 5–15)
BUN: 20 mg/dL (ref 6–20)
CO2: 25 mmol/L (ref 22–32)
Calcium: 8.7 mg/dL — ABNORMAL LOW (ref 8.9–10.3)
Chloride: 102 mmol/L (ref 101–111)
Creatinine, Ser: 1.1 mg/dL — ABNORMAL HIGH (ref 0.44–1.00)
GFR calc Af Amer: 52 mL/min — ABNORMAL LOW (ref >60)
GFR calc non Af Amer: 45 mL/min — ABNORMAL LOW (ref >60)
Glucose, Bld: 103 mg/dL — ABNORMAL HIGH (ref 65–99)
Potassium: 3.8 mmol/L (ref 3.5–5.1)
Sodium: 138 mmol/L (ref 135–145)

## 2018-04-07 LAB — URINALYSIS, ROUTINE W REFLEX MICROSCOPIC
Bilirubin Urine: NEGATIVE
Glucose, UA: NEGATIVE mg/dL
Hgb urine dipstick: NEGATIVE
Ketones, ur: NEGATIVE mg/dL
Nitrite: NEGATIVE
Protein, ur: NEGATIVE mg/dL
Specific Gravity, Urine: 1.018 (ref 1.005–1.030)
pH: 5 (ref 5.0–8.0)

## 2018-04-07 LAB — CBC
HCT: 37.3 % (ref 36.0–46.0)
Hemoglobin: 12.1 g/dL (ref 12.0–15.0)
MCH: 32.2 pg (ref 26.0–34.0)
MCHC: 32.4 g/dL (ref 30.0–36.0)
MCV: 99.2 fL (ref 78.0–100.0)
Platelets: 166 10*3/uL (ref 150–400)
RBC: 3.76 MIL/uL — ABNORMAL LOW (ref 3.87–5.11)
RDW: 14.8 % (ref 11.5–15.5)
WBC: 12.8 10*3/uL — ABNORMAL HIGH (ref 4.0–10.5)

## 2018-04-07 LAB — SURGICAL PCR SCREEN
MRSA, PCR: NEGATIVE
Staphylococcus aureus: POSITIVE — AB

## 2018-04-07 LAB — BLOOD PRODUCT ORDER (VERBAL) VERIFICATION

## 2018-04-07 NOTE — Progress Notes (Addendum)
Patient ID: [name], female   DOB: 02-Aug-1931, 84 y.o.   MRN: [id number]  PROGRESS NOTE    ANGELLEE COHILL  XFG:[id number] DOB: [birth year] DOA: 04/06/2018  PCP: No primary care provider on file.   Brief Narrative:  84 y.o. female with past medical history significant for hypertension, afib,  hypothyroidism, status post previous right AKA who walks around with 2 prostheses presented to the ED after a mechanical fall.  in the ED patient complained of right elbow and right hip pain. X-rays revealed a comminuted displaced right olecranon fracture as well as a valgus impacted right femoral neck fracture. CT head without acute findings in the ED. Ortho seen pt in consultation.    Assessment & Plan:   Principal Problem:   Rt Hip Fracture ([place]) / Rt Olecranon fracture - Right shoulder x ray showed interval healing of humeral shaft fracture without evidence of acute osseous abnormality - X ray of the elbow showed comminuted olecranon process fractures with overlying soft tissue swelling - CT right hip showed subcapital right femoral neck fracture with impaction as described - Right hip x ray showed displaced subcapital fracture of the RIGHT femoral neck. Marked osseous demineralization. Prior reconstructive surgery of the distal RIGHT femur. - X ray of the right femur showed distal right femoral prosthesis without acute osseous abnormality - Appreciate orthopedic surgery consultation and recommendations  - Continue pain management efforts   Active Problems:    Essential hypertension - Continue metoprolol and hydralazine    Paroxysmal A-fib (HCC) - CHA2DS2-VASc Score - Hold apixaban for surgery - Rate controlled with metoprolol     Hypothyroidism - Continue synthroid    DVT prophylaxis: Heparin subQ Code Status: full code  Family Communication: no family at the bedside  Disposition Plan: surgery planned for Monday    Consultants:   Orthopedic surgery   Procedures:   None     Antimicrobials:   None     Subjective: No overnight events.  Objective: Vitals:   04/06/18 2324 04/07/18 0508 04/07/18 0720 04/07/18 0821  BP: (!) 95/47 (!) 95/53  (!) 94/45  Pulse: (!) 59 63  65  Resp:      Temp: 97.7 F (36.5 C) 97.9 F (36.6 C)  97.8 F (36.6 C)  TempSrc: Oral Oral  Oral  SpO2: 97% 98% 98% 96%  Weight:      Height:        Intake/Output Summary (Last 24 hours) at 04/07/2018 1302 Last data filed at 04/07/2018 0011 Gross per 24 hour  Intake 3 ml  Output 0 ml  Net 3 ml   Filed Weights   04/06/18 1900  Weight: 59.9 kg (132 lb)    Examination:  General exam: Appears calm and comfortable  Respiratory system: Clear to auscultation. Respiratory effort normal. Cardiovascular system: S1 & S2 heard, RRR. No JVD, murmurs, rubs, gallops or clicks. No pedal edema. Gastrointestinal system: Abdomen is nondistended, soft and nontender. No organomegaly or masses felt. Normal bowel sounds heard. Central nervous system: Alert and oriented. No focal neurological deficits. Extremities: right aka Skin: warm, dry Psychiatry: Judgement and insight appear normal. Mood & affect appropriate.   Data Reviewed: I have personally reviewed following labs and imaging studies  CBC: Recent Labs  Lab 04/06/18 1858 04/07/18 0733  WBC 14.9* 12.8*  HGB 13.0 12.1  HCT 39.7 37.3  MCV 98.5 99.2  PLT 170 967   Basic Metabolic Panel: Recent Labs  Lab 04/06/18 1858 04/07/18 0733  NA 139 138  K 3.6 3.8  CL 104 102  CO2 23 25  GLUCOSE 122* 103*  BUN 19 20  CREATININE 1.03* 1.10*  CALCIUM 8.9 8.7*   GFR: Estimated Creatinine Clearance: 36 mL/min (A) (by C-G formula based on SCr of 1.1 mg/dL (H)). Liver Function Tests: Recent Labs  Lab 04/06/18 1858  AST 31  ALT 15  ALKPHOS 62  BILITOT 1.1  PROT 6.3*  ALBUMIN 3.5   No results for input(s): LIPASE, AMYLASE in the last 168 hours. No results for input(s): AMMONIA in the last 168 hours. Coagulation  Profile: Recent Labs  Lab 04/06/18 1858  INR 1.39   Cardiac Enzymes: No results for input(s): CKTOTAL, CKMB, CKMBINDEX, TROPONINI in the last 168 hours. BNP (last 3 results) No results for input(s): PROBNP in the last 8760 hours. HbA1C: No results for input(s): HGBA1C in the last 72 hours. CBG: No results for input(s): GLUCAP in the last 168 hours. Lipid Profile: No results for input(s): CHOL, HDL, LDLCALC, TRIG, CHOLHDL, LDLDIRECT in the last 72 hours. Thyroid Function Tests: No results for input(s): TSH, T4TOTAL, FREET4, T3FREE, THYROIDAB in the last 72 hours. Anemia Panel: No results for input(s): VITAMINB12, FOLATE, FERRITIN, TIBC, IRON, RETICCTPCT in the last 72 hours. Urine analysis: No results found for: COLORURINE, APPEARANCEUR, LABSPEC, PHURINE, GLUCOSEU, HGBUR, BILIRUBINUR, KETONESUR, PROTEINUR, UROBILINOGEN, NITRITE, LEUKOCYTESUR Sepsis Labs: @LABRCNTIP (procalcitonin:4,lacticidven:4)    Surgical pcr screen     Status: Abnormal   Collection Time: 04/06/18 11:04 PM  Result Value Ref Range Status   MRSA, PCR NEGATIVE NEGATIVE Final   Staphylococcus aureus POSITIVE (A) NEGATIVE Final      Radiology Studies: Dg Chest 1 View Result Date: 04/06/2018 Chronic cardiomegaly.  No acute abnormalities. Aortic Atherosclerosis   Dg Shoulder Right Result Date: 4/19/2019Interval healing of humeral shaft fracture without evidence of acute osseous abnormality.   Dg Elbow Complete Right Result Date: 04/06/2018 Comminuted olecranon process fractures with overlying soft tissue swelling.   Ct Head Wo Contrast Result Date: 04/06/2018 No acute intracranial pathology 2. No acute osseous injury of the cervical spine.   Ct Cervical Spine Wo Contrast Result Date: 04/06/2018 1. No acute intracranial pathology 2. No acute osseous injury of the cervical spine.   Ct Hip Right Wo Contrast Result Date: 04/06/2018 Subcapital right femoral neck fracture with impaction as described. The  overall appearance is similar to that seen on prior plain film examination.  Dg Hip Unilat W Or Wo Pelvis 2-3 Views Right Result Date: 04/06/2018 Displaced subcapital fracture of the RIGHT femoral neck. Marked osseous demineralization. Prior reconstructive surgery of the distal RIGHT femur.   Dg Femur Min 2 Views Right Result Date: 04/06/2018 Distal right femoral prosthesis without acute osseous abnormality identified.    Scheduled Meds: . heparin  5,000 Units Subcutaneous Q8H  . hydrALAZINE  25 mg Oral TID  . levothyroxine  25 mcg Oral QAC breakfast  . methocarbamol  500 mg Oral TID  . metoprolol   100 mg Oral Daily  . multivitamin with   1 tablet Oral Daily  . polyethylene glycol  17 g Oral Daily  . senna  1 tablet Oral BID   Continuous Infusions: . sodium chloride       LOS: 1 day    Time spent: 25 minutes  Greater than 50% of the time spent on counseling and coordinating the care.   Leisa Lenz, MD [facility] Pager [phone]  If 7PM-7AM, please contact night-coverage [email or web address] Password [email or web address] 04/07/2018, 1:02 PM

## 2018-04-07 NOTE — Progress Notes (Signed)
Subjective: [name] is laying comfortably in bed. She states that her pain is mild unless she moves the left leg or right arm. The long arm splint is in place.  She is tolerating food and liquid but states that she is not very hungry today.  She denies chest pain, shortness of breath, fever, chills, nausea, vomiting, or diarrhea.  Objective: Vital signs in last 24 hours: Temp:  [97.7 F (36.5 C)-97.9 F (36.6 C)] 97.8 F (36.6 C) (04/20 0821) Pulse Rate:  [50-72] 65 (04/20 0821) Resp:  [15-26] 15 (04/19 2133) BP: (94-114)/(44-61) 94/45 (04/20 0821) SpO2:  [93 %-100 %] 96 % (04/20 0821) Weight:  [59.9 kg (132 lb)] 59.9 kg (132 lb) (04/19 1900)  Intake/Output from previous day: 04/19 0701 - 04/20 0700 In: 3 [I.V.:3] Out: 0  Intake/Output this shift: No intake/output data recorded.  Recent Labs    04/06/18 1858  HGB 13.0   Recent Labs    04/06/18 1858  WBC 14.9*  RBC 4.03  HCT 39.7  PLT 170   Recent Labs    04/06/18 1858  NA 139  K 3.6  CL 104  CO2 23  BUN 19  CREATININE 1.03*  GLUCOSE 122*  CALCIUM 8.9   Recent Labs    04/06/18 1858  INR 1.39    Alert and oriented x 4 Neurovascular intact Sensation intact distally Intact pulses distally Dorsiflexion/Plantar flexion intact  Long arm splint C/D/I. Able to wiggle all fingers and make a partial fist.  Distal capillary refill is less than 2 seconds.     Assessment/Plan: NWB RLE Continue with the long arm splint. Hand surgery will continue care of the olecranon fracture.  Continue with regular diet as tolerated.  Continue with pain management as needed.    Brynda Peon 04/07/2018, 8:27 AM

## 2018-04-07 NOTE — Plan of Care (Signed)

## 2018-04-07 NOTE — Progress Notes (Signed)
Patient transfered  from ED via stretcher. Patient is alert, denies pain. Patient is resting, call bell at reach.

## 2018-04-08 ENCOUNTER — Inpatient Hospital Stay (HOSPITAL_COMMUNITY): Payer: Medicare Other | Admitting: Certified Registered Nurse Anesthetist

## 2018-04-08 ENCOUNTER — Inpatient Hospital Stay (HOSPITAL_COMMUNITY): Payer: Medicare Other

## 2018-04-08 ENCOUNTER — Encounter (HOSPITAL_COMMUNITY)
Admission: EM | Disposition: A | Discharge status: Skilled Nursing Facility | Payer: Self-pay | Source: Home / Self Care | Attending: Internal Medicine

## 2018-04-08 HISTORY — PX: ORIF ELBOW FRACTURE: SHX5031

## 2018-04-08 SURGERY — OPEN REDUCTION INTERNAL FIXATION (ORIF) ELBOW/OLECRANON FRACTURE
Anesthesia: General | Site: Arm Upper | Laterality: Right

## 2018-04-08 MED ORDER — ROCURONIUM BROMIDE 10 MG/ML (PF) SYRINGE
PREFILLED_SYRINGE | INTRAVENOUS | Status: DC | PRN
  Administered 2018-04-08: 40 mg via INTRAVENOUS

## 2018-04-08 MED ORDER — CEFAZOLIN SODIUM-DEXTROSE 2-4 GM/100ML-% IV SOLN
2.0000 g | INTRAVENOUS | Status: AC
  Administered 2018-04-08: 2 g via INTRAVENOUS
  Filled 2018-04-08: qty 100

## 2018-04-08 MED ORDER — ONDANSETRON HCL 4 MG/2ML IJ SOLN
4.0000 mg | Freq: Four times a day (QID) | INTRAMUSCULAR | Status: DC | PRN
  Administered 2018-04-10: 4 mg via INTRAVENOUS
  Filled 2018-04-08: qty 2

## 2018-04-08 MED ORDER — LACTATED RINGERS IV SOLN
INTRAVENOUS | Status: DC
  Administered 2018-04-08 – 2018-04-09 (×2): via INTRAVENOUS

## 2018-04-08 MED ORDER — FENTANYL CITRATE (PF) 100 MCG/2ML IJ SOLN
25.0000 ug | INTRAMUSCULAR | Status: DC | PRN
  Administered 2018-04-08 (×4): 25 ug via INTRAVENOUS

## 2018-04-08 MED ORDER — CEFAZOLIN SODIUM-DEXTROSE 1-4 GM/50ML-% IV SOLN
1.0000 g | Freq: Four times a day (QID) | INTRAVENOUS | Status: AC
  Administered 2018-04-08 – 2018-04-09 (×3): 1 g via INTRAVENOUS
  Filled 2018-04-08 (×3): qty 50

## 2018-04-08 MED ORDER — CHLORHEXIDINE GLUCONATE 4 % EX LIQD
60.0000 mL | Freq: Once | CUTANEOUS | Status: AC
  Administered 2018-04-08: 4 via TOPICAL
  Filled 2018-04-08: qty 60

## 2018-04-08 MED ORDER — ACETAMINOPHEN 500 MG PO TABS
1000.0000 mg | ORAL_TABLET | Freq: Once | ORAL | Status: AC
  Administered 2018-04-08: 1000 mg via ORAL
  Filled 2018-04-08: qty 2

## 2018-04-08 MED ORDER — LACTATED RINGERS IV SOLN
INTRAVENOUS | Status: DC
  Administered 2018-04-08: 04:00:00 via INTRAVENOUS

## 2018-04-08 MED ORDER — HYDROCODONE-ACETAMINOPHEN 5-325 MG PO TABS: 1.0000 | ORAL_TABLET | ORAL | 0 refills | Status: DC | PRN

## 2018-04-08 MED ORDER — ONDANSETRON HCL 4 MG/2ML IJ SOLN: 4.0000 mg | Freq: Once | INTRAMUSCULAR | Status: DC | PRN

## 2018-04-08 MED ORDER — BUPIVACAINE HCL (PF) 0.25 % IJ SOLN
INTRAMUSCULAR | Status: AC
  Filled 2018-04-08: qty 30

## 2018-04-08 MED ORDER — FENTANYL CITRATE (PF) 250 MCG/5ML IJ SOLN
INTRAMUSCULAR | Status: AC
  Filled 2018-04-08: qty 5

## 2018-04-08 MED ORDER — 0.9 % SODIUM CHLORIDE (POUR BTL) OPTIME
TOPICAL | Status: DC | PRN
  Administered 2018-04-08: 1000 mL

## 2018-04-08 MED ORDER — SUGAMMADEX SODIUM 200 MG/2ML IV SOLN
INTRAVENOUS | Status: DC | PRN
  Administered 2018-04-08: 150 mg via INTRAVENOUS

## 2018-04-08 MED ORDER — PHENYLEPHRINE 40 MCG/ML (10ML) SYRINGE FOR IV PUSH (FOR BLOOD PRESSURE SUPPORT)
PREFILLED_SYRINGE | INTRAVENOUS | Status: DC | PRN
  Administered 2018-04-08: 120 ug via INTRAVENOUS
  Administered 2018-04-08: 80 ug via INTRAVENOUS

## 2018-04-08 MED ORDER — ONDANSETRON HCL 4 MG/2ML IJ SOLN
INTRAMUSCULAR | Status: DC | PRN
  Administered 2018-04-08: 4 mg via INTRAVENOUS

## 2018-04-08 MED ORDER — SODIUM CHLORIDE 0.9 % IV SOLN
INTRAVENOUS | Status: DC | PRN
  Administered 2018-04-08: 10:00:00 via INTRAVENOUS

## 2018-04-08 MED ORDER — LIDOCAINE 2% (20 MG/ML) 5 ML SYRINGE
INTRAMUSCULAR | Status: DC | PRN
  Administered 2018-04-08: 40 mg via INTRAVENOUS

## 2018-04-08 MED ORDER — PHENYLEPHRINE HCL 10 MG/ML IJ SOLN
INTRAVENOUS | Status: DC | PRN
  Administered 2018-04-08: 25 ug/min via INTRAVENOUS

## 2018-04-08 MED ORDER — DEXAMETHASONE SODIUM PHOSPHATE 10 MG/ML IJ SOLN
INTRAMUSCULAR | Status: DC | PRN
  Administered 2018-04-08: 5 mg via INTRAVENOUS

## 2018-04-08 MED ORDER — BUPIVACAINE HCL 0.25 % IJ SOLN
INTRAMUSCULAR | Status: DC | PRN
  Administered 2018-04-08: 8 mL

## 2018-04-08 MED ORDER — DOCUSATE SODIUM 100 MG PO CAPS
100.0000 mg | ORAL_CAPSULE | Freq: Two times a day (BID) | ORAL | Status: DC
  Administered 2018-04-08 – 2018-04-11 (×7): 100 mg via ORAL
  Filled 2018-04-08 (×7): qty 1

## 2018-04-08 MED ORDER — PROPOFOL 10 MG/ML IV BOLUS
INTRAVENOUS | Status: DC | PRN
  Administered 2018-04-08: 140 mg via INTRAVENOUS
  Administered 2018-04-08: 20 mg via INTRAVENOUS

## 2018-04-08 MED ORDER — FENTANYL CITRATE (PF) 250 MCG/5ML IJ SOLN
INTRAMUSCULAR | Status: DC | PRN
  Administered 2018-04-08: 25 ug via INTRAVENOUS
  Administered 2018-04-08: 75 ug via INTRAVENOUS

## 2018-04-08 MED ORDER — PROPOFOL 10 MG/ML IV BOLUS
INTRAVENOUS | Status: AC
  Filled 2018-04-08: qty 20

## 2018-04-08 MED ORDER — CEFAZOLIN SODIUM-DEXTROSE 2-4 GM/100ML-% IV SOLN
INTRAVENOUS | Status: AC
  Filled 2018-04-08: qty 100

## 2018-04-08 MED ORDER — ONDANSETRON HCL 4 MG PO TABS
4.0000 mg | ORAL_TABLET | Freq: Four times a day (QID) | ORAL | Status: DC | PRN
  Administered 2018-04-09 (×2): 4 mg via ORAL
  Filled 2018-04-08: qty 1

## 2018-04-08 MED ORDER — LACTATED RINGERS IV SOLN
INTRAVENOUS | Status: DC | PRN
  Administered 2018-04-08: 07:00:00 via INTRAVENOUS

## 2018-04-08 MED ORDER — FENTANYL CITRATE (PF) 100 MCG/2ML IJ SOLN
INTRAMUSCULAR | Status: AC
  Administered 2018-04-08: 25 ug via INTRAVENOUS
  Filled 2018-04-08: qty 2

## 2018-04-08 SURGICAL SUPPLY — 87 items
APL SKNCLS STERI-STRIP NONHPOA (GAUZE/BANDAGES/DRESSINGS)
BANDAGE ACE 3X5.8 VEL STRL LF (GAUZE/BANDAGES/DRESSINGS) ×3 IMPLANT
BANDAGE ACE 4X5 VEL STRL LF (GAUZE/BANDAGES/DRESSINGS) ×3 IMPLANT
BANDAGE ELASTIC 4 VELCRO ST LF (GAUZE/BANDAGES/DRESSINGS) ×3 IMPLANT
BENZOIN TINCTURE PRP APPL 2/3 (GAUZE/BANDAGES/DRESSINGS) IMPLANT
BIT DRILL 2.6 (BIT) ×2 IMPLANT
BLADE CLIPPER SURG (BLADE) ×3 IMPLANT
BLADE SURG 10 STRL SS (BLADE) IMPLANT
BNDG CMPR 9X4 STRL LF SNTH (GAUZE/BANDAGES/DRESSINGS) ×1
BNDG CMPR MD 5X2 ELC HKLP STRL (GAUZE/BANDAGES/DRESSINGS) ×1
BNDG COHESIVE 4X5 TAN STRL (GAUZE/BANDAGES/DRESSINGS) ×3 IMPLANT
BNDG ELASTIC 2 VLCR STRL LF (GAUZE/BANDAGES/DRESSINGS) ×2 IMPLANT
BNDG ESMARK 4X9 LF (GAUZE/BANDAGES/DRESSINGS) ×2 IMPLANT
BNDG GAUZE ELAST 4 BULKY (GAUZE/BANDAGES/DRESSINGS) ×3 IMPLANT
CANISTER SUCT 3000ML PPV (MISCELLANEOUS) IMPLANT
CLOSURE WOUND 1/2 X4 (GAUZE/BANDAGES/DRESSINGS)
COVER SURGICAL LIGHT HANDLE (MISCELLANEOUS) ×3 IMPLANT
CUFF TOURNIQUET SINGLE 18IN (TOURNIQUET CUFF) ×3 IMPLANT
CUFF TOURNIQUET SINGLE 24IN (TOURNIQUET CUFF) ×3 IMPLANT
DRAPE C-ARM 42X72 X-RAY (DRAPES) ×3 IMPLANT
DRAPE IMP U-DRAPE 54X76 (DRAPES) ×3 IMPLANT
DRAPE INCISE IOBAN 66X45 STRL (DRAPES) IMPLANT
DRAPE ORTHO SPLIT 77X108 STRL (DRAPES) ×6
DRAPE SURG ORHT 6 SPLT 77X108 (DRAPES) ×2 IMPLANT
DRAPE U-SHAPE 47X51 STRL (DRAPES) ×3 IMPLANT
DRSG ADAPTIC 3X8 NADH LF (GAUZE/BANDAGES/DRESSINGS) ×2 IMPLANT
DURAPREP 26ML APPLICATOR (WOUND CARE) ×6 IMPLANT
ELECT REM PT RETURN 9FT ADLT (ELECTROSURGICAL) ×3
ELECTRODE REM PT RTRN 9FT ADLT (ELECTROSURGICAL) ×1 IMPLANT
GAUZE SPONGE 4X4 12PLY STRL (GAUZE/BANDAGES/DRESSINGS) ×3 IMPLANT
GAUZE SPONGE 4X4 16PLY XRAY LF (GAUZE/BANDAGES/DRESSINGS) ×2 IMPLANT
GAUZE XEROFORM 5X9 LF (GAUZE/BANDAGES/DRESSINGS) ×3 IMPLANT
GLOVE BIO SURGEON STRL SZ7.5 (GLOVE) ×6 IMPLANT
GLOVE BIOGEL PI IND STRL 8 (GLOVE) ×2 IMPLANT
GLOVE BIOGEL PI INDICATOR 8 (GLOVE) ×4
GOWN STRL REUS W/ TWL LRG LVL3 (GOWN DISPOSABLE) ×2 IMPLANT
GOWN STRL REUS W/ TWL XL LVL3 (GOWN DISPOSABLE) ×1 IMPLANT
GOWN STRL REUS W/TWL LRG LVL3 (GOWN DISPOSABLE) ×6
GOWN STRL REUS W/TWL XL LVL3 (GOWN DISPOSABLE) ×3
K-WIRE ORTHOPEDIC 1.4X150L (WIRE) ×6
KIT BASIN OR (CUSTOM PROCEDURE TRAY) ×3 IMPLANT
KIT TURNOVER KIT B (KITS) ×3 IMPLANT
KWIRE ORTHOPEDIC 1.4X150L (WIRE) IMPLANT
MANIFOLD NEPTUNE II (INSTRUMENTS) IMPLANT
NS IRRIG 1000ML POUR BTL (IV SOLUTION) ×3 IMPLANT
PACK ORTHO EXTREMITY (CUSTOM PROCEDURE TRAY) ×3 IMPLANT
PAD ABD 8X10 STRL (GAUZE/BANDAGES/DRESSINGS) ×2 IMPLANT
PAD ARMBOARD 7.5X6 YLW CONV (MISCELLANEOUS) ×3 IMPLANT
PAD CAST 4YDX4 CTTN HI CHSV (CAST SUPPLIES) ×1 IMPLANT
PADDING CAST COTTON 4X4 STRL (CAST SUPPLIES) ×3
PADDING CAST SYNTHETIC 2 (CAST SUPPLIES) ×2
PADDING CAST SYNTHETIC 2X4 NS (CAST SUPPLIES) IMPLANT
PADDING CAST SYNTHETIC 4 (CAST SUPPLIES) ×6
PADDING CAST SYNTHETIC 4X4 STR (CAST SUPPLIES) IMPLANT
PLATE LOCK VA OLECR 89X4H RT (Plate) ×2 IMPLANT
SCREW BONE 3.5X16MM (Screw) ×2 IMPLANT
SCREW BONE 3.5X18MM (Screw) ×2 IMPLANT
SCREW BONE 3.5X24MM (Screw) ×2 IMPLANT
SCREW LOCK ST FT 3.5X36 (Screw) ×2 IMPLANT
SCREW LOCKING 14MM (Screw) ×2 IMPLANT
SCREW LOCKING 16MM (Screw) ×2 IMPLANT
SCREW LOCKING 18MMX3.5MM (Screw) ×2 IMPLANT
SCREW LOCKING 20MM (Screw) ×2 IMPLANT
SPLINT PLASTER CAST XFAST 5X30 (CAST SUPPLIES) IMPLANT
SPLINT PLASTER XFAST SET 5X30 (CAST SUPPLIES) ×2
SPONGE LAP 18X18 X RAY DECT (DISPOSABLE) ×5 IMPLANT
STAPLER VISISTAT 35W (STAPLE) ×3 IMPLANT
STRIP CLOSURE SKIN 1/2X4 (GAUZE/BANDAGES/DRESSINGS) IMPLANT
SUCTION FRAZIER HANDLE 10FR (MISCELLANEOUS) ×2
SUCTION TUBE FRAZIER 10FR DISP (MISCELLANEOUS) ×1 IMPLANT
SUT ETHILON 4 0 PS 2 18 (SUTURE) ×2 IMPLANT
SUT FIBERWIRE #2 38 REV NDL BL (SUTURE) ×3
SUT MNCRL AB 4-0 PS2 18 (SUTURE) ×3 IMPLANT
SUT MON AB 2-0 CT1 36 (SUTURE) ×3 IMPLANT
SUT VIC AB 0 CT1 27 (SUTURE) ×6
SUT VIC AB 0 CT1 27XBRD ANBCTR (SUTURE) ×1 IMPLANT
SUT VIC AB 2-0 CT1 27 (SUTURE) ×3
SUT VIC AB 2-0 CT1 TAPERPNT 27 (SUTURE) IMPLANT
SUTURE FIBERWR#2 38 REV NDL BL (SUTURE) IMPLANT
SYR CONTROL 10ML LL (SYRINGE) IMPLANT
TOWEL OR 17X24 6PK STRL BLUE (TOWEL DISPOSABLE) ×3 IMPLANT
TOWEL OR 17X26 10 PK STRL BLUE (TOWEL DISPOSABLE) ×3 IMPLANT
TUBE CONNECTING 12'X1/4 (SUCTIONS) ×1
TUBE CONNECTING 12X1/4 (SUCTIONS) ×2 IMPLANT
UNDERPAD 30X30 (UNDERPADS AND DIAPERS) ×3 IMPLANT
WATER STERILE IRR 1000ML POUR (IV SOLUTION) ×3 IMPLANT
YANKAUER SUCT BULB TIP NO VENT (SUCTIONS) ×3 IMPLANT

## 2018-04-08 NOTE — Anesthesia Preprocedure Evaluation (Addendum)
Anesthesia Evaluation  Patient identified by MRN, date of birth, ID band Patient awake    Reviewed: Allergy & Precautions, NPO status , Patient's Chart, lab work & pertinent test results  Airway Mallampati: II  TM Distance: >3 FB Neck ROM: Full    Dental  (+) Teeth Intact, Partial Upper, Dental Advisory Given   Pulmonary    breath sounds clear to auscultation       Cardiovascular hypertension,  Rhythm:Irregular Rate:Normal     Neuro/Psych    GI/Hepatic   Endo/Other    Renal/GU      Musculoskeletal   Abdominal   Peds  Hematology   Anesthesia Other Findings   Reproductive/Obstetrics                             Anesthesia Physical Anesthesia Plan  ASA: III  Anesthesia Plan: General   Post-op Pain Management:    Induction: Intravenous  PONV Risk Score and Plan: Ondansetron and Dexamethasone  Airway Management Planned: Oral ETT  Additional Equipment:   Intra-op Plan:   Post-operative Plan: Extubation in OR  Informed Consent: I have reviewed the patients History and Physical, chart, labs and discussed the procedure including the risks, benefits and alternatives for the proposed anesthesia with the patient or authorized representative who has indicated his/her understanding and acceptance.   Dental advisory given  Plan Discussed with: CRNA and Anesthesiologist  Anesthesia Plan Comments:         Anesthesia Quick Evaluation

## 2018-04-08 NOTE — Progress Notes (Signed)
Patient to OR

## 2018-04-08 NOTE — Anesthesia Procedure Notes (Signed)
Anesthesia Regional Block: Supraclavicular block   Pre-Anesthetic Checklist: ,, timeout performed, Correct Patient, Correct Site, Correct Laterality, Correct Procedure, Correct Position, site marked, Risks and benefits discussed,  Surgical consent,  Pre-op evaluation,  At surgeon's request and post-op pain management  Laterality: Right  Prep: chloraprep       Needles:  Injection technique: Single-shot  Needle Type: Echogenic Stimulator Needle     Needle Length: 9cm      Additional Needles:   Procedures:,,,, ultrasound used (permanent image in chart),,,,  Narrative:  Start time: 04/08/2018 11:00 AM End time: 04/08/2018 11:10 AM Injection made incrementally with aspirations every 5 mL.  Performed by: Personally   Additional Notes: 20 cc 0.75% Naropin with 0.2 mg clonidine injected easily

## 2018-04-08 NOTE — Op Note (Signed)
04/06/2018 - 04/08/2018  9:10 AM  PATIENT:  [name]    PRE-OPERATIVE DIAGNOSIS:  Right olecranon fracture   POST-OPERATIVE DIAGNOSIS:  Same  PROCEDURE:  OPEN REDUCTION INTERNAL FIXATION (ORIF) ELBOW/OLECRANON FRACTURE  SURGEON:  Aamira Bischoff, Ernesta Amble, MD  ASSISTANT: Roxan Hockey, PA-C, he was present and scrubbed throughout the case, critical for completion in a timely fashion, and for retraction, instrumentation, and closure.   ANESTHESIA:   gen  PREOPERATIVE INDICATIONS:  [name] is a  84 y.o. female with a diagnosis of Right olecranon fracture  who failed conservative measures and elected for surgical management.    The risks benefits and alternatives were discussed with the patient preoperatively including but not limited to the risks of infection, bleeding, nerve injury, cardiopulmonary complications, the need for revision surgery, among others, and the patient was willing to proceed.  OPERATIVE IMPLANTS: [employer] olecranon plate, fiberwire triceps repair  OPERATIVE FINDINGS: poor bone quality, comminuted fx  BLOOD LOSS: min  COMPLICATIONS: none  TOURNIQUET TIME: 80min  OPERATIVE PROCEDURE:  Patient was identified in the preoperative holding area and site was marked by me She was transported to the operating theater and placed on the table in supine position taking care to pad all bony prominences. After a preincinduction time out anesthesia was induced. The right extremity was prepped and draped in normal sterile fashion and a pre-incision timeout was performed. She received ancef for preoperative antibiotics.   Prior to prepping and draping a plate placed in the lateral position padding all bony prominences extra was placed.  Arm was placed over the sure foot.  I made a posterior incision over her olecranon fracture protected her ulnar nerve.  Fracture was highly comminuted there was a large central articular piece that was able to reduce this into place.  I  elected to use a locking plate given her poor bone quality and high combination of her fracture.  I was able to get her tip of her olecranon back under the plate.  I pinned this into place.  I then fixed the plate distally and then placed locking screws into the proximal aspect of the plate securing the articular piece as well as the triceps insertion portion of her olecranon.  Given her tenuous bone quality and small piece of bone with triceps on it I elected to perform a triceps repair as well I repaired this to the plate using a #2 FiberWire.  I thoroughly irrigated her incision I placed additional screws distally to secure the plate there.  Skin was closed in layers sterile dressing was applied and a long-arm splint.  She was awoken and taken the PACU in stable condition  POST OPERATIVE PLAN: Resume dvt prophylaxis per primary, NWB RUE x 6wks

## 2018-04-08 NOTE — Plan of Care (Signed)

## 2018-04-08 NOTE — Transfer of Care (Signed)
Immediate Anesthesia Transfer of Care Note  Patient: [name]  Procedure(s) Performed: OPEN REDUCTION INTERNAL FIXATION (ORIF) ELBOW/OLECRANON FRACTURE (Right Arm Upper)  Patient Location: PACU  Anesthesia Type:General  Level of Consciousness: awake and alert   Airway & Oxygen Therapy: Patient Spontanous Breathing and Patient connected to nasal cannula oxygen  Post-op Assessment: Report given to RN and Post -op Vital signs reviewed and stable  Post vital signs: Reviewed and stable  Last Vitals:  Vitals Value Taken Time  BP 100/79 04/08/2018  9:55 AM  Temp    Pulse 62 04/08/2018 10:02 AM  Resp 18 04/08/2018 10:02 AM  SpO2 96 % 04/08/2018 10:02 AM  Vitals shown include unvalidated device data.  Last Pain:  Vitals:   04/08/18 0459  TempSrc:   PainSc: Asleep      Patients Stated Pain Goal: 3 (38/38/18 4037)  Complications: No apparent anesthesia complications
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
## 2018-04-08 NOTE — Anesthesia Procedure Notes (Signed)
Procedure Name: Intubation Date/Time: 04/08/2018 8:05 AM Performed by: Valda Favia, CRNA Pre-anesthesia Checklist: Patient identified, Emergency Drugs available, Suction available, Patient being monitored and Timeout performed Patient Re-evaluated:Patient Re-evaluated prior to induction Oxygen Delivery Method: Circle system utilized Preoxygenation: Pre-oxygenation with 100% oxygen Induction Type: IV induction Ventilation: Mask ventilation without difficulty and Oral airway inserted - appropriate to patient size Laryngoscope Size: Mac and 4 Grade View: Grade I Tube type: Oral Tube size: 7.0 mm Number of attempts: 1 Airway Equipment and Method: Stylet Placement Confirmation: ETT inserted through vocal cords under direct vision,  positive ETCO2 and breath sounds checked- equal and bilateral Secured at: 20 cm Tube secured with: Tape Dental Injury: Teeth and Oropharynx as per pre-operative assessment

## 2018-04-08 NOTE — Anesthesia Procedure Notes (Signed)
Anesthesia Regional Block: Supraclavicular block   Pre-Anesthetic Checklist: ,, timeout performed, Correct Patient, Correct Site, Correct Laterality, Correct Procedure, Correct Position, site marked, Risks and benefits discussed,  Surgical consent,  Pre-op evaluation,  At surgeon's request and post-op pain management  Laterality: Right  Prep: chloraprep       Needles:  Injection technique: Single-shot  Needle Type: Stimulator Needle - 80          Additional Needles:   Procedures:,,,, ultrasound used (permanent image in chart),,,,  Narrative:  Start time: 04/08/2018 11:20 AM End time: 04/08/2018 11:30 AM Injection made incrementally with aspirations every 5 mL.  Performed by: Personally   Additional Notes: 20 cc 0.75% Ropivacaine with 1:200 Epi injected easily

## 2018-04-08 NOTE — Anesthesia Postprocedure Evaluation (Signed)
Anesthesia Post Note  Patient: [name]  Procedure(s) Performed: OPEN REDUCTION INTERNAL FIXATION (ORIF) ELBOW/OLECRANON FRACTURE (Right Arm Upper)     Patient location during evaluation: PACU Anesthesia Type: General Level of consciousness: awake and alert Pain management: pain level controlled Vital Signs Assessment: post-procedure vital signs reviewed and stable Respiratory status: spontaneous breathing, nonlabored ventilation, respiratory function stable and patient connected to nasal cannula oxygen Cardiovascular status: blood pressure returned to baseline and stable Postop Assessment: no apparent nausea or vomiting Anesthetic complications: no    Last Vitals:  Vitals:   04/08/18 1241 04/08/18 1500  BP: (!) 86/47 (!) 89/57  Pulse: 61 65  Resp: (!) 21 20  Temp: (!) 36.4 C 36.7 C  SpO2: 96% 96%    Last Pain:  Vitals:   04/08/18 1522  TempSrc:   PainSc: 0-No pain                 Ayuub Penley COKER

## 2018-04-08 NOTE — Progress Notes (Signed)
Attempted to in & out cath patient w/o success- unable to visualize urethra, pt voided 125cc amber urine prior to attempt to cath, post void residual bladder - follow up  call to [place], PA-C - verbally informed scan shows 456ml & that C. Rendon Howell RN unable to cath patient. Per [facility] [place], PA-C, pt is being followed by hospitalist and requests that service follows up to determine if pt needs a urology consult for a foley catheter.

## 2018-04-08 NOTE — Progress Notes (Addendum)
Patient ID: [name], female   DOB: June 20, 1931, 84 y.o.   MRN: [id number]  PROGRESS NOTE    [name]  WGN:[id number] DOB: February 26, 1931 DOA: 04/06/2018  PCP: No primary care provider on file.   Brief Narrative:  84 y.o. female with past medical history significant for hypertension, afib,  hypothyroidism, status post previous right AKA who walks around with 2 prostheses presented to the ED after a mechanical fall.  in the ED patient complained of right elbow and right hip pain. X-rays revealed a comminuted displaced right olecranon fracture as well as a valgus impacted right femoral neck fracture. CT head without acute findings in the ED. Ortho seen pt in consultation.    Assessment & Plan:   Principal Problem:   Rt Hip Fracture ([place]) / Rt Olecranon fracture - Right shoulder x ray showed interval healing of humeral shaft fracture without evidence of acute osseous abnormality - X ray of the elbow showed comminuted olecranon process fractures with overlying soft tissue swelling - CT right hip showed subcapital right femoral neck fracture with impaction as described - Right hip x ray showed displaced subcapital fracture of the RIGHT femoral neck. Marked osseous demineralization. Prior reconstructive surgery of the distal RIGHT femur. - X ray of the right femur showed distal right femoral prosthesis without acute osseous abnormality - Continue pain management efforts  - Plan for surgery this am  Active Problems:    Essential hypertension - Continue metoprolol and hydralazine - BP stable     Paroxysmal A-fib (HCC) - CHA2DS2-VASc Score - Rate controlled with metoprolol  - Holding apixaban for surgery     Hypothyroidism - Continue synthroid    DVT prophylaxis: Heparin subQ Code Status: full code  Family Communication: no family at bedside  Disposition Plan: surgery this am   Consultants:   Orthopedic surgery   Procedures:   None   Antimicrobials:   None      Subjective: No overnight events.   Objective: Vitals:   04/07/18 0821 04/07/18 1531 04/07/18 1945 04/08/18 0445  BP: (!) 94/45 (!) 93/50 (!) 97/54 (!) 102/54  Pulse: 65 (!) 59 63 67  Resp:  (!) 22    Temp: 97.8 F (36.6 C) 98 F (36.7 C) 98.2 F (36.8 C) 97.8 F (36.6 C)  TempSrc: Oral Oral Oral Oral  SpO2: 96% 97% 98% 99%  Weight:      Height:        Intake/Output Summary (Last 24 hours) at 04/08/2018 0957 Last data filed at 04/08/2018 0551 Gross per 24 hour  Intake -  Output 450 ml  Net -450 ml   Filed Weights   04/06/18 1900  Weight: 59.9 kg (132 lb)    Physical Exam  Constitutional: Appears well-developed and well-nourished. No distress.  CVS: RRR, S1/S2 +, no murmurs, no gallops, no carotid bruit.  Pulmonary: Effort and breath sounds normal, no stridor, rhonchi, wheezes, rales.  Abdominal: Soft. BS +,  no distension, tenderness, rebound or guarding.  Musculoskeletal: Right aka Lymphadenopathy: No lymphadenopathy noted, cervical, inguinal. Neuro: Alert. Normal reflexes, muscle tone coordination. No cranial nerve deficit. Skin: Skin is warm and dry. No rash noted. Not diaphoretic. No erythema. No pallor.  Psychiatric: Normal mood and affect. Behavior, judgment, thought content normal.     Data Reviewed: I have personally reviewed following labs and imaging studies  CBC: Recent Labs  Lab 04/06/18 1858 04/07/18 0733  WBC 14.9* 12.8*  HGB 13.0 12.1  HCT 39.7 37.3  MCV 98.5  99.2  PLT 170 062   Basic Metabolic Panel: Recent Labs  Lab 04/06/18 1858 04/07/18 0733  NA 139 138  K 3.6 3.8  CL 104 102  CO2 23 25  GLUCOSE 122* 103*  BUN 19 20  CREATININE 1.03* 1.10*  CALCIUM 8.9 8.7*   GFR: Estimated Creatinine Clearance: 36 mL/min (A) (by C-G formula based on SCr of 1.1 mg/dL (H)). Liver Function Tests: Recent Labs  Lab 04/06/18 1858  AST 31  ALT 15  ALKPHOS 62  BILITOT 1.1  PROT 6.3*  ALBUMIN 3.5   No results for input(s): LIPASE,  AMYLASE in the last 168 hours. No results for input(s): AMMONIA in the last 168 hours. Coagulation Profile: Recent Labs  Lab 04/06/18 1858  INR 1.39   Cardiac Enzymes: No results for input(s): CKTOTAL, CKMB, CKMBINDEX, TROPONINI in the last 168 hours. BNP (last 3 results) No results for input(s): PROBNP in the last 8760 hours. HbA1C: No results for input(s): HGBA1C in the last 72 hours. CBG: No results for input(s): GLUCAP in the last 168 hours. Lipid Profile: No results for input(s): CHOL, HDL, LDLCALC, TRIG, CHOLHDL, LDLDIRECT in the last 72 hours. Thyroid Function Tests: No results for input(s): TSH, T4TOTAL, FREET4, T3FREE, THYROIDAB in the last 72 hours. Anemia Panel: No results for input(s): VITAMINB12, FOLATE, FERRITIN, TIBC, IRON, RETICCTPCT in the last 72 hours. Urine analysis:    Component Value Date/Time   COLORURINE AMBER (A) 04/07/2018 1846   APPEARANCEUR HAZY (A) 04/07/2018 1846   LABSPEC 1.018 04/07/2018 1846   PHURINE 5.0 04/07/2018 1846   GLUCOSEU NEGATIVE 04/07/2018 1846   HGBUR NEGATIVE 04/07/2018 1846   BILIRUBINUR NEGATIVE 04/07/2018 1846   KETONESUR NEGATIVE 04/07/2018 1846   PROTEINUR NEGATIVE 04/07/2018 1846   NITRITE NEGATIVE 04/07/2018 1846   LEUKOCYTESUR LARGE (A) 04/07/2018 1846   Sepsis Labs: @LABRCNTIP (procalcitonin:4,lacticidven:4)    Surgical pcr screen     Status: Abnormal   Collection Time: 04/06/18 11:04 PM  Result Value Ref Range Status   MRSA, PCR NEGATIVE NEGATIVE Final   Staphylococcus aureus POSITIVE (A) NEGATIVE Final      Radiology Studies: Dg Chest 1 View Result Date: 04/06/2018 Chronic cardiomegaly.  No acute abnormalities. Aortic Atherosclerosis   Dg Shoulder Right Result Date: 4/19/2019Interval healing of humeral shaft fracture without evidence of acute osseous abnormality.   Dg Elbow Complete Right Result Date: 04/06/2018 Comminuted olecranon process fractures with overlying soft tissue swelling.   Ct Head Wo  Contrast Result Date: 04/06/2018 No acute intracranial pathology 2. No acute osseous injury of the cervical spine.   Ct Cervical Spine Wo Contrast Result Date: 04/06/2018 1. No acute intracranial pathology 2. No acute osseous injury of the cervical spine.   Ct Hip Right Wo Contrast Result Date: 04/06/2018 Subcapital right femoral neck fracture with impaction as described. The overall appearance is similar to that seen on prior plain film examination.  Dg Hip Unilat W Or Wo Pelvis 2-3 Views Right Result Date: 04/06/2018 Displaced subcapital fracture of the RIGHT femoral neck. Marked osseous demineralization. Prior reconstructive surgery of the distal RIGHT femur.   Dg Femur Min 2 Views Right Result Date: 04/06/2018 Distal right femoral prosthesis without acute osseous abnormality identified.    Scheduled Meds: . heparin  5,000 Units Subcutaneous Q8H  . hydrALAZINE  25 mg Oral TID  . levothyroxine  25 mcg Oral QAC breakfast  . methocarbamol  500 mg Oral TID  . metoprolol   100 mg Oral Daily  . multivitamin with  1 tablet Oral Daily  . polyethylene glycol  17 g Oral Daily  . senna  1 tablet Oral BID   Continuous Infusions: . [MAR Hold] sodium chloride    . lactated ringers 50 mL/hr at 04/08/18 0400     LOS: 2 days    Time spent: 25 minutes  Greater than 50% of the time spent on counseling and coordinating the care.   Leisa Lenz, MD [facility] Pager [phone]  If 7PM-7AM, please contact night-coverage [email or web address] Password [facility] 04/08/2018, 9:57 AM

## 2018-04-09 LAB — URINE CULTURE
Culture: >=100000 — AB
Special Requests: NORMAL

## 2018-04-09 MED ORDER — MUPIROCIN 2 % EX OINT
1.0000 "application " | TOPICAL_OINTMENT | Freq: Two times a day (BID) | CUTANEOUS | Status: DC
  Administered 2018-04-09 – 2018-04-11 (×5): 1 via NASAL
  Filled 2018-04-09: qty 22

## 2018-04-09 MED ORDER — CHLORHEXIDINE GLUCONATE CLOTH 2 % EX PADS
6.0000 | MEDICATED_PAD | Freq: Every day | CUTANEOUS | Status: DC
  Administered 2018-04-09 – 2018-04-11 (×2): 6 via TOPICAL

## 2018-04-09 NOTE — Progress Notes (Signed)
PT id by DOB name and MRN

## 2018-04-09 NOTE — Progress Notes (Signed)
    Subjective:  Patient reports pain as moderate.  Denies N/V/CP/SOB. Had ORIF R elbow with Dr. Percell Miller yesterday.  Objective:   VITALS:   Vitals:   04/08/18 2300 04/09/18 0435 04/09/18 0856 04/09/18 0931  BP: (!) 87/42 (!) 99/45  (!) 119/46  Pulse: (!) 59 60 65   Resp:   15   Temp: 98.6 F (37 C) 97.8 F (36.6 C)    TempSrc: Oral Oral    SpO2: 100% 98% 97%   Weight:      Height:        NAD ABD soft RLE: AKA stump incis healed. No significant pain with logroll.  Lab Results  Component Value Date   WBC 12.8 (H) 04/07/2018   HGB 12.1 04/07/2018   HCT 37.3 04/07/2018   MCV 99.2 04/07/2018   PLT 166 04/07/2018   BMET    Component Value Date/Time   NA 138 04/07/2018 0733   K 3.8 04/07/2018 0733   CL 102 04/07/2018 0733   CO2 25 04/07/2018 0733   GLUCOSE 103 (H) 04/07/2018 0733   BUN 20 04/07/2018 0733   CREATININE 1.10 (H) 04/07/2018 0733   CALCIUM 8.7 (L) 04/07/2018 0733   GFRNONAA 45 (L) 04/07/2018 0733   GFRAA 52 (L) 04/07/2018 0733     Assessment/Plan: 1 Day Post-Op   Principal Problem:   Rt Hip Fracture (HCC) Active Problems:   Rt Olecranon fracture   Essential hypertension   A-fib (HCC)   RUE per Dr. Percell Miller RLE NWB with walker, do not use prosthesis for at least 6 weeks DVT ppx: resume apixaban, SCDs, TEDS PO pain control PT/OT Dispo: D/C planning   Hilton Cork Nitasha Jewel 04/09/2018, 11:37 AM   Rod Can, MD Cell [phone]

## 2018-04-09 NOTE — Progress Notes (Signed)
Patient ID: JYLA HOPF, female   DOB: 1931/03/17, 84 y.o.   MRN: [id number]  PROGRESS NOTE    [name]  GEX:[id number] DOB: 1931-02-06 DOA: 04/06/2018  PCP: No primary care provider on file.   Brief Narrative:  84 y.o. female with past medical history significant for hypertension, afib,  hypothyroidism, status post previous right AKA who walks around with 2 prostheses presented to the ED after a mechanical fall.  in the ED patient complained of right elbow and right hip pain. X-rays revealed a comminuted displaced right olecranon fracture as well as a valgus impacted right femoral neck fracture. CT head without acute findings in the ED. Ortho seen pt in consultation.    Assessment & Plan:   Principal Problem:   Rt Hip Fracture ([place]) / Rt Olecranon fracture - Right shoulder x ray showed interval healing of humeral shaft fracture without evidence of acute osseous abnormality - X ray of the elbow showed comminuted olecranon process fractures with overlying soft tissue swelling - CT right hip showed subcapital right femoral neck fracture with impaction as described - Right hip x ray showed displaced subcapital fracture of the RIGHT femoral neck. Marked osseous demineralization. Prior reconstructive surgery of the distal RIGHT femur. - X ray of the right femur showed distal right femoral prosthesis without acute osseous abnormality - Continue pain management efforts  - Pt is status post ORIF of elbow/olecranon fracture 4/21 - Follow up on orthopedic recommendation    Active Problems:    Essential hypertension - Continue metoprolol and hydralazine - BP stable     Paroxysmal A-fib (HCC) - CHA2DS2-VASc Score - Rate controlled with metoprolol  - Held apixaban for surgery     Hypothyroidism - Continue synthroid    DVT prophylaxis: Heparin subQ Code Status: full code  Family Communication: no family at bedside  Disposition Plan: home versus rehab once cleared by ortho     Consultants:   Orthopedic surgery   Procedures:   ORIF elbow/olecranon fracture - 04/08/2018  Antimicrobials:   None     Subjective: Pain this am in right elbow.  Objective: Vitals:   04/08/18 2300 04/09/18 0435 04/09/18 0856 04/09/18 0931  BP: (!) 87/42 (!) 99/45  (!) 119/46  Pulse: (!) 59 60 65   Resp:   15   Temp: 98.6 F (37 C) 97.8 F (36.6 C)    TempSrc: Oral Oral    SpO2: 100% 98% 97%   Weight:      Height:        Intake/Output Summary (Last 24 hours) at 04/09/2018 1001 Last data filed at 04/09/2018 0617 Gross per 24 hour  Intake 931.67 ml  Output 1685 ml  Net -753.33 ml   Filed Weights   04/06/18 1900  Weight: 59.9 kg (132 lb)    Physical Exam  Constitutional: Appears well-developed and well-nourished. No distress.  Neck: Normal ROM. Neck supple. No JVD. No tracheal deviation. No thyromegaly.  CVS: RRR, S1/S2 + Pulmonary: Effort and breath sounds normal, no stridor, rhonchi, wheezes, rales.  Abdominal: Soft. BS +,  no distension, tenderness, rebound or guarding.  Musculoskeletal: right AKA, pain in the right elbow Lymphadenopathy: No lymphadenopathy noted, cervical, inguinal. Neuro: Alert. Normal reflexes, muscle tone coordination. No cranial nerve deficit. Skin: Skin is warm and dry. Marland Kitchen  Psychiatric: Normal mood and affect. Behavior, judgment, thought content normal.     Data Reviewed: I have personally reviewed following labs and imaging studies  CBC: Recent Labs  Lab 04/06/18 1858  04/07/18 0733  WBC 14.9* 12.8*  HGB 13.0 12.1  HCT 39.7 37.3  MCV 98.5 99.2  PLT 170 101   Basic Metabolic Panel: Recent Labs  Lab 04/06/18 1858 04/07/18 0733  NA 139 138  K 3.6 3.8  CL 104 102  CO2 23 25  GLUCOSE 122* 103*  BUN 19 20  CREATININE 1.03* 1.10*  CALCIUM 8.9 8.7*   GFR: Estimated Creatinine Clearance: 36 mL/min (A) (by C-G formula based on SCr of 1.1 mg/dL (H)). Liver Function Tests: Recent Labs  Lab 04/06/18 1858  AST 31   ALT 15  ALKPHOS 62  BILITOT 1.1  PROT 6.3*  ALBUMIN 3.5   No results for input(s): LIPASE, AMYLASE in the last 168 hours. No results for input(s): AMMONIA in the last 168 hours. Coagulation Profile: Recent Labs  Lab 04/06/18 1858  INR 1.39   Cardiac Enzymes: No results for input(s): CKTOTAL, CKMB, CKMBINDEX, TROPONINI in the last 168 hours. BNP (last 3 results) No results for input(s): PROBNP in the last 8760 hours. HbA1C: No results for input(s): HGBA1C in the last 72 hours. CBG: No results for input(s): GLUCAP in the last 168 hours. Lipid Profile: No results for input(s): CHOL, HDL, LDLCALC, TRIG, CHOLHDL, LDLDIRECT in the last 72 hours. Thyroid Function Tests: No results for input(s): TSH, T4TOTAL, FREET4, T3FREE, THYROIDAB in the last 72 hours. Anemia Panel: No results for input(s): VITAMINB12, FOLATE, FERRITIN, TIBC, IRON, RETICCTPCT in the last 72 hours. Urine analysis:    Component Value Date/Time   COLORURINE AMBER (A) 04/07/2018 1846   APPEARANCEUR HAZY (A) 04/07/2018 1846   LABSPEC 1.018 04/07/2018 1846   PHURINE 5.0 04/07/2018 1846   GLUCOSEU NEGATIVE 04/07/2018 1846   HGBUR NEGATIVE 04/07/2018 1846   BILIRUBINUR NEGATIVE 04/07/2018 1846   KETONESUR NEGATIVE 04/07/2018 1846   PROTEINUR NEGATIVE 04/07/2018 1846   NITRITE NEGATIVE 04/07/2018 1846   LEUKOCYTESUR LARGE (A) 04/07/2018 1846   Sepsis Labs: @LABRCNTIP (procalcitonin:4,lacticidven:4)    Surgical pcr screen     Status: Abnormal   Collection Time: 04/06/18 11:04 PM  Result Value Ref Range Status   MRSA, PCR NEGATIVE NEGATIVE Final   Staphylococcus aureus POSITIVE (A) NEGATIVE Final      Radiology Studies: Dg Chest 1 View Result Date: 04/06/2018 Chronic cardiomegaly.  No acute abnormalities. Aortic Atherosclerosis   Dg Shoulder Right Result Date: 4/19/2019Interval healing of humeral shaft fracture without evidence of acute osseous abnormality.   Dg Elbow Complete Right Result Date:  04/06/2018 Comminuted olecranon process fractures with overlying soft tissue swelling.   Ct Head Wo Contrast Result Date: 04/06/2018 No acute intracranial pathology 2. No acute osseous injury of the cervical spine.   Ct Cervical Spine Wo Contrast Result Date: 04/06/2018 1. No acute intracranial pathology 2. No acute osseous injury of the cervical spine.   Ct Hip Right Wo Contrast Result Date: 04/06/2018 Subcapital right femoral neck fracture with impaction as described. The overall appearance is similar to that seen on prior plain film examination.  Dg Hip Unilat W Or Wo Pelvis 2-3 Views Right Result Date: 04/06/2018 Displaced subcapital fracture of the RIGHT femoral neck. Marked osseous demineralization. Prior reconstructive surgery of the distal RIGHT femur.   Dg Femur Min 2 Views Right Result Date: 04/06/2018 Distal right femoral prosthesis without acute osseous abnormality identified.    Scheduled Meds: . heparin  5,000 Units Subcutaneous Q8H  . hydrALAZINE  25 mg Oral TID  . levothyroxine  25 mcg Oral QAC breakfast  . methocarbamol  500 mg  Oral TID  . metoprolol   100 mg Oral Daily  . multivitamin with   1 tablet Oral Daily  . polyethylene glycol  17 g Oral Daily  . senna  1 tablet Oral BID   Continuous Infusions: . sodium chloride    . lactated ringers 50 mL/hr at 04/08/18 1510     LOS: 3 days    Time spent: 25 minutes  Greater than 50% of the time spent on counseling and coordinating the care.   Sharon Lenz, MD [facility] Pager [phone]  If 7PM-7AM, please contact night-coverage [email or web address] Password [facility] 04/09/2018, 10:01 AM

## 2018-04-09 NOTE — Plan of Care (Signed)
  Problem: Education: Goal: Knowledge of General Education information will improve Outcome: Progressing   Problem: Activity: Goal: Risk for activity intolerance will decrease Outcome: Progressing   Problem: Nutrition: Goal: Adequate nutrition will be maintained Outcome: Progressing   Problem: Elimination: Goal: Will not experience complications related to bowel motility Outcome: Progressing   Problem: Safety: Goal: Ability to remain free from injury will improve Outcome: Progressing   Problem: Skin Integrity: Goal: Risk for impaired skin integrity will decrease Outcome: Progressing

## 2018-04-09 NOTE — Progress Notes (Signed)
    Right Olecranon Fracture: NWB Right Upper Extremity.    Wear sling when ambulating  Keep right arm splint on and dry Elevate right arm to reduce pain / swelling Open & close hand and fingers to reduce swelling  Follow - up plan: Follow up in the office with Dr. Alain Marion in 2 weeks.  Please call with questions.

## 2018-04-10 ENCOUNTER — Encounter (HOSPITAL_COMMUNITY): Payer: Self-pay | Admitting: Orthopedic Surgery

## 2018-04-10 MED ORDER — HYDROMORPHONE HCL 2 MG/ML IJ SOLN
1.0000 mg | INTRAMUSCULAR | Status: DC | PRN
  Administered 2018-04-10: 1 mg via INTRAVENOUS
  Filled 2018-04-10: qty 1

## 2018-04-10 NOTE — Progress Notes (Signed)
OT Cancellation Note  Patient Details Name: [name] MRN: [id number] DOB: 06/30/1931   Cancelled Treatment:    Reason Eval/Treat Not Completed: Other (comment); Pt with Medicare and plan to return to SNF. No apparent immediate acute care OT needs, therefore will defer OT to SNF. If OT eval is needed please call Acute Rehab Dept. at [phone].  Norman Herrlich, MS OTR/L  Pager: [phone]    Norman Herrlich 04/10/2018, 3:12 PM

## 2018-04-10 NOTE — Evaluation (Signed)
Physical Therapy Evaluation Patient Details Name: [name] MRN: [id number] DOB: 06-23-1931 Today's Date: 04/10/2018   History of Present Illness  Tiffanni Scarfo comes to [facility] on 4/19 after sustainng a fall at home, found to have a fracture in the Right Hip (treated conservatively at this time) and Right olecranon fracture, now s/p ORIF. Pt is NWB on both RUE/RLE. PTA pt pertfomred mostly household mobility with WC and short distances to BR with RW and Rt prosthesis. She has been using Rt AKA prosthesis for 3+ years for mobility. PTA pt was indep in ADL, did not acess community often d/t fear of falling.   Clinical Impression  Pt admitted with above diagnosis. Pt currently with functional limitations due to the deficits listed below (see "PT Problem List"). Upon entry, the patient is received semirecumbent in bed, no family/caregiver present. The pt is awake and agreeable to participate. Pt reporting significant pain in Right elbow, also in left ankle later in session when attempting transfers. Pt desaturating on room air, but VSS on 1L/min. Bed mobility requires up to maxA, pt unable to perform transfers d/t Left ankle pain upon weightbearing: RN made aware of Left ankle pain, a new complaint. Pt tolerates bed exercises well with minimal pain limitations. With current weight bearing restrictions, patient will only be able to perform transfers with 1 arm and 1 leg, which she is unable to do at this time, but can be addressed with high frequency, high volume rehab services offered at short term rehab in facility. Pt will benefit from skilled PT intervention to increase independence and safety with basic mobility in preparation for discharge to the venue listed below.       Follow Up Recommendations SNF;Supervision/Assistance - 24 hour;Supervision for mobility/OOB;Follow surgeon's recommendation for DC plan and follow-up therapies    Equipment Recommendations  (hemiwalker )    Recommendations for  Other Services       Precautions / Restrictions Precautions Precautions: Fall Restrictions Weight Bearing Restrictions: Yes RUE Weight Bearing: Non weight bearing RLE Weight Bearing: Non weight bearing      Mobility  Bed Mobility Overal bed mobility: Needs Assistance Bed Mobility: Supine to Sit;Sit to Supine(maxA for repositioning in bed: )     Supine to sit: Supervision Sit to supine: Supervision      Transfers                 General transfer comment: Unable to attempt due to Left ankle pain upon weight bearing, a new finding.   Ambulation/Gait                Stairs            Wheelchair Mobility    Modified Rankin (Stroke Patients Only)       Balance                                             Pertinent Vitals/Pain Pain Assessment: 0-10 Pain Score: 8  Pain Location: Right elbow  Pain Descriptors / Indicators: Operative site guarding Pain Intervention(s): Limited activity within patient's tolerance;Monitored during session;RN gave pain meds during session    [place] expects to be discharged to:: Private residence Living Arrangements: Spouse/significant other Available Help at Discharge: Family Type of Home: House Home Access: Stairs to enter   [employer] of Steps: 14 front; zero at back door Home  Layout: One level;Able to live on main level with bedroom/bathroom;Laundry or work area in [place]: Environmental consultant - 4 wheels;Bedside commode;Wheelchair - manual;Shower seat      Prior Function Level of Independence: Independent with assistive device(s)         Comments: WC for most mobility in/out of home. short distances to BR with 4WW and Rt prosthesis.      Hand Dominance        Extremity/Trunk Assessment             Cervical / Trunk Assessment Cervical / Trunk Assessment: Kyphotic  Communication   Communication: No difficulties  Cognition Arousal/Alertness:  Awake/alert Behavior During Therapy: WFL for tasks assessed/performed Overall Cognitive Status: Within Functional Limits for tasks assessed                                        General Comments      Exercises General Exercises - Lower Extremity Ankle Circles/Pumps: AROM;Left;10 reps;Seated(pain limiting, less than 50% ROM tolerated) Long Arc Quad: AROM;Left;10 reps;Limitations Long Arc Quad Limitations: 10x3secH Amputee Exercises Gluteal Sets: AROM;10 reps;Right Hip ABduction/ADduction: AROM;AAROM;Right;15 reps Hip Flexion/Marching: AROM;AAROM;Right;10 reps   Assessment/Plan    PT Assessment Patient needs continued PT services  PT Problem List Decreased strength;Decreased activity tolerance;Decreased balance;Decreased mobility       PT Treatment Interventions Balance training;Functional mobility training;Therapeutic activities;Therapeutic exercise;Patient/family education    PT Goals (Current goals can be found in the Care Plan section)  Acute Rehab PT Goals Patient Stated Goal: return to independent mobility PT Goal Formulation: With patient Time For Goal Achievement: 04/24/18 Potential to Achieve Goals: Fair    Frequency Min 5X/week   Barriers to discharge Decreased caregiver support pt unsure if husband will be able to assist with all ADL     Co-evaluation               AM-PAC PT "6 Clicks" Daily Activity  Outcome Measure Difficulty turning over in bed (including adjusting bedclothes, sheets and blankets)?: Unable Difficulty moving from lying on back to sitting on the side of the bed? : Unable Difficulty sitting down on and standing up from a chair with arms (e.g., wheelchair, bedside commode, etc,.)?: Unable Help needed moving to and from a bed to chair (including a wheelchair)?: Total Help needed walking in hospital room?: Total Help needed climbing 3-5 steps with a railing? : Total 6 Click Score: 6    End of Session Equipment  Utilized During Treatment: Gait belt;Oxygen Activity Tolerance: Patient tolerated treatment well;Patient limited by pain;Patient limited by fatigue Patient left: with call bell/phone within reach;in bed;with bed alarm set Nurse Communication: Mobility status PT Visit Diagnosis: Difficulty in walking, not elsewhere classified (R26.2);History of falling (Z91.81);Muscle weakness (generalized) (M62.81)    Time: 0354-6568 PT Time Calculation (min) (ACUTE ONLY): 48 min   Charges:   PT Evaluation $PT Eval Moderate Complexity: 1 Mod PT Treatments $Therapeutic Exercise: 8-22 mins $Therapeutic Activity: 8-22 mins   PT G Codes:        10:51 AM, 05-02-2018 Etta Grandchild, PT, DPT Physical Therapist - [place] [phone] (Pager)  [phone] (Office)      Buccola,Allan C 05-02-18, 10:51 AM

## 2018-04-10 NOTE — Clinical Social Work Note (Signed)
Clinical Social Work Assessment  Patient Details  Name: [name] MRN: [id number] Date of Birth: 1931-05-22  Date of referral:  04/10/18               Reason for consult:  Facility Placement                Permission sought to share information with:  Chartered certified accountant granted to share information::  Yes, Verbal Permission Granted  Name::     Marrietta Thunder  Agency::  SNF  Relationship::  spouse  Contact Information:     Housing/Transportation Living arrangements for the past 2 months:  [place] of Information:  Patient Patient Interpreter Needed:  None Criminal Activity/Legal Involvement Pertinent to Current Situation/Hospitalization:  No - Comment as needed Significant Relationships:  Spouse, Other Family Members Lives with:  Spouse Do you feel safe going back to the place where you live?  No Need for family participation in patient care:  No (Coment)  Care giving concerns:  Pt from home with spouse, given new impairment, pt will need short term rehab.  Social Worker assessment / plan:  CSW met with patient at bedside. Pt was initially hoping to return home however her impairment that is requiring her to be NWB on the right leg and will be unable to go home safely. Pt confirmed that she was independent with ADL's prior to hospitalization, however spouse assisted and she used wheelchair/ walker. Pt has experience with SNF in the past. CSW explained SNF process and placement. CSW obtained permission to send to SNF's in [employer] and she is interested in [facility].  CSW will f/u for disposition.  Employment status:  Retired Forensic scientist:  Medicare PT Recommendations:  [place] / Referral to community resources:  [place]  Patient/Family's Response to care:  Patient appreciative of [place] meeting to discuss SNF placement.  Patient/Family's Understanding of and Emotional Response to  Diagnosis, Current Treatment, and Prognosis:  Patient has good understanding of her impairment and agreeable to SNF at discharge despite wanted to return home. Pt is NWB and will need rehabilitative therapies. Once patient has completed rehabilitative therapies she will dc home. No issues or concerns identified.  Emotional Assessment Appearance:  Appears stated age Attitude/Demeanor/Rapport:  (Cooperative) Affect (typically observed):  Accepting, Appropriate Orientation:  Oriented to  Time, Oriented to Place, Oriented to Self Alcohol / Substance use:  Not Applicable Psych involvement (Current and /or in the community):  No (Comment)  Discharge Needs  Concerns to be addressed:  Discharge Planning Concerns Readmission within the last 30 days:  No Current discharge risk:  Dependent with Mobility, Physical Impairment Barriers to Discharge:  No Barriers Identified   Normajean Baxter, LCSW 04/10/2018, 11:34 AM

## 2018-04-10 NOTE — Progress Notes (Signed)
Patient ID: [name], female   DOB: 09-20-1931, 84 y.o.   MRN: [id number]  PROGRESS NOTE    [name]  OFB:[id number] DOB: 02-24-1931 DOA: 04/06/2018  PCP: No primary care provider on file.   Brief Narrative:  84 y.o. female with past medical history significant for hypertension, afib,  hypothyroidism, status post previous right AKA who walks around with 2 prostheses presented to the ED after a mechanical fall.  in the ED patient complained of right elbow and right hip pain. X-rays revealed a comminuted displaced right olecranon fracture as well as a valgus impacted right femoral neck fracture. CT head without acute findings in the ED. Ortho seen pt in consultation, pt underwent ORIF of elbow/olecranon fracture 4/21.   Assessment & Plan:   Principal Problem:   Rt Hip Fracture ([place]) / Rt Olecranon fracture - Right shoulder x ray showed interval healing of humeral shaft fracture without evidence of acute osseous abnormality - X ray of the elbow showed comminuted olecranon process fractures with overlying soft tissue swelling - CT right hip showed subcapital right femoral neck fracture with impaction as described - Right hip x ray showed displaced subcapital fracture of the RIGHT femoral neck. Marked osseous demineralization. Prior reconstructive surgery of the distal RIGHT femur. - X ray of the right femur showed distal right femoral prosthesis without acute osseous abnormality - Pt is status post ORIF of elbow/olecranon fracture 4/21 - Per ortho, non-op fem neck fracture - Continue pain management  - Per PT eval - SNF recommended - Per ortho, do not use prosthesis for at least 6 weeks   Active Problems:    Essential hypertension - Continue metoprolol and hydralazine - Stable     Paroxysmal A-fib (HCC) - CHA2DS2-VASc Score - Rate controlled with metoprolol  - Held apixaban for surgery     Hypothyroidism - Continue synthroid    DVT prophylaxis: Heparin subQ Code  Status: full code  Family Communication: no family at bedside Disposition Plan: to SNF once bed available    Consultants:   Orthopedic surgery   Procedures:   ORIF elbow/olecranon fracture - 04/08/2018  Antimicrobials:   None     Subjective: No overnight events.  Objective: Vitals:   04/10/18 0859 04/10/18 0929 04/10/18 1004 04/10/18 1228  BP:  (!) 103/55  (!) 116/55  Pulse:  68 70 60  Resp:  16    Temp:  98.1 F (36.7 C)  (!) 97.5 F (36.4 C)  TempSrc:  Oral  Oral  SpO2: 97% 98% (!) 88% 93%  Weight:      Height:        Intake/Output Summary (Last 24 hours) at 04/10/2018 1316 Last data filed at 04/10/2018 0400 Gross per 24 hour  Intake -  Output 650 ml  Net -650 ml   Filed Weights   04/06/18 1900  Weight: 59.9 kg (132 lb)    Physical Exam  Constitutional: Appears well-developed and well-nourished. No distress.   CVS: RRR, S1/S2 + Pulmonary: Effort and breath sounds normal, no stridor, rhonchi, wheezes, rales.  Abdominal: Soft. BS +,  no distension, tenderness, rebound or guarding.  Musculoskeletal: right aka, right elbow in splint  Lymphadenopathy: No lymphadenopathy noted, cervical, inguinal. Neuro: Alert. Normal reflexes, muscle tone coordination. No cranial nerve deficit. Skin: Skin is warm and dry. Psychiatric: Normal mood and affect. Behavior, judgment, thought content normal.    Data Reviewed: I have personally reviewed following labs and imaging studies  CBC: Recent Labs  Lab 04/06/18  1858 04/07/18 0733  WBC 14.9* 12.8*  HGB 13.0 12.1  HCT 39.7 37.3  MCV 98.5 99.2  PLT 170 027   Basic Metabolic Panel: Recent Labs  Lab 04/06/18 1858 04/07/18 0733  NA 139 138  K 3.6 3.8  CL 104 102  CO2 23 25  GLUCOSE 122* 103*  BUN 19 20  CREATININE 1.03* 1.10*  CALCIUM 8.9 8.7*   GFR: Estimated Creatinine Clearance: 36 mL/min (A) (by C-G formula based on SCr of 1.1 mg/dL (H)). Liver Function Tests: Recent Labs  Lab 04/06/18 1858  AST 31    ALT 15  ALKPHOS 62  BILITOT 1.1  PROT 6.3*  ALBUMIN 3.5   No results for input(s): LIPASE, AMYLASE in the last 168 hours. No results for input(s): AMMONIA in the last 168 hours. Coagulation Profile: Recent Labs  Lab 04/06/18 1858  INR 1.39   Cardiac Enzymes: No results for input(s): CKTOTAL, CKMB, CKMBINDEX, TROPONINI in the last 168 hours. BNP (last 3 results) No results for input(s): PROBNP in the last 8760 hours. HbA1C: No results for input(s): HGBA1C in the last 72 hours. CBG: No results for input(s): GLUCAP in the last 168 hours. Lipid Profile: No results for input(s): CHOL, HDL, LDLCALC, TRIG, CHOLHDL, LDLDIRECT in the last 72 hours. Thyroid Function Tests: No results for input(s): TSH, T4TOTAL, FREET4, T3FREE, THYROIDAB in the last 72 hours. Anemia Panel: No results for input(s): VITAMINB12, FOLATE, FERRITIN, TIBC, IRON, RETICCTPCT in the last 72 hours. Urine analysis:    Component Value Date/Time   COLORURINE AMBER (A) 04/07/2018 1846   APPEARANCEUR HAZY (A) 04/07/2018 1846   LABSPEC 1.018 04/07/2018 1846   PHURINE 5.0 04/07/2018 1846   GLUCOSEU NEGATIVE 04/07/2018 1846   HGBUR NEGATIVE 04/07/2018 1846   BILIRUBINUR NEGATIVE 04/07/2018 1846   KETONESUR NEGATIVE 04/07/2018 1846   PROTEINUR NEGATIVE 04/07/2018 1846   NITRITE NEGATIVE 04/07/2018 1846   LEUKOCYTESUR LARGE (A) 04/07/2018 1846   Sepsis Labs: @LABRCNTIP (procalcitonin:4,lacticidven:4)    Surgical pcr screen     Status: Abnormal   Collection Time: 04/06/18 11:04 PM  Result Value Ref Range Status   MRSA, PCR NEGATIVE NEGATIVE Final   Staphylococcus aureus POSITIVE (A) NEGATIVE Final      Radiology Studies: Dg Chest 1 View Result Date: 04/06/2018 Chronic cardiomegaly.  No acute abnormalities. Aortic Atherosclerosis   Dg Shoulder Right Result Date: 4/19/2019Interval healing of humeral shaft fracture without evidence of acute osseous abnormality.   Dg Elbow Complete Right Result Date:  04/06/2018 Comminuted olecranon process fractures with overlying soft tissue swelling.   Ct Head Wo Contrast Result Date: 04/06/2018 No acute intracranial pathology 2. No acute osseous injury of the cervical spine.   Ct Cervical Spine Wo Contrast Result Date: 04/06/2018 1. No acute intracranial pathology 2. No acute osseous injury of the cervical spine.   Ct Hip Right Wo Contrast Result Date: 04/06/2018 Subcapital right femoral neck fracture with impaction as described. The overall appearance is similar to that seen on prior plain film examination.  Dg Hip Unilat W Or Wo Pelvis 2-3 Views Right Result Date: 04/06/2018 Displaced subcapital fracture of the RIGHT femoral neck. Marked osseous demineralization. Prior reconstructive surgery of the distal RIGHT femur.   Dg Femur Min 2 Views Right Result Date: 04/06/2018 Distal right femoral prosthesis without acute osseous abnormality identified.    Scheduled Meds: . heparin  5,000 Units Subcutaneous Q8H  . hydrALAZINE  25 mg Oral TID  . levothyroxine  25 mcg Oral QAC breakfast  . methocarbamol  500 mg Oral TID  . metoprolol   100 mg Oral Daily  . multivitamin with   1 tablet Oral Daily  . polyethylene glycol  17 g Oral Daily  . senna  1 tablet Oral BID   Continuous Infusions: . sodium chloride    . lactated ringers 50 mL/hr at 04/09/18 1231     LOS: 4 days    Time spent: 25 minutes  Greater than 50% of the time spent on counseling and coordinating the care.   Leisa Lenz, MD [facility] Pager [phone]  If 7PM-7AM, please contact night-coverage [email or web address] Password [email or web address] 04/10/2018, 1:16 PM

## 2018-04-10 NOTE — Progress Notes (Signed)
New Complaint:  Patient has left ankle pain when attempting to stand with Physical Therapist.

## 2018-04-10 NOTE — Care Management Important Message (Signed)
Important Message  Patient Details  Name: [name] MRN: [id number] Date of Birth: 05/21/1931   Medicare Important Message Given:  Yes    [name] [address] 04/10/2018, 2:17 PM

## 2018-04-10 NOTE — NC FL2 (Signed)
[place] LEVEL OF CARE SCREENING TOOL     IDENTIFICATION  Patient Name: [name] Birthdate: November 04, 1931 Sex: female Admission Date (Current Location): 04/06/2018  [facility] [place] Number:  Herbalist and Address:  [facility] [place] [place], [place] [address], [place], [place] [address]      Provider Number: [id number]  Attending Physician Name and Address:  Robbie Lis, MD  Relative Name and Phone Number:  Swayze Kozuch, spouse, [phone]    Current Level of Care: Hospital Recommended Level of Care: [place] Prior Approval Number:    Date Approved/Denied:   PASRR Number: [id number]  Discharge Plan: SNF    Current Diagnoses: Patient Active Problem List   Diagnosis Date Noted  . Rt Olecranon fracture 04/07/2018  . Essential hypertension 04/07/2018  . A-fib ([place]) 04/07/2018  . Rt Hip Fracture ([place]) 04/06/2018    Orientation RESPIRATION BLADDER Height & Weight     Self, Time, Situation, Place  Normal Continent Weight: 132 lb (59.9 kg) Height:  5\' 7"  (170.2 cm)  BEHAVIORAL SYMPTOMS/MOOD NEUROLOGICAL BOWEL NUTRITION STATUS      Continent Diet(See DC Summary)  AMBULATORY STATUS COMMUNICATION OF NEEDS Skin   Extensive Assist Verbally Surgical wounds                       Personal Care Assistance Level of Assistance  Dressing, Feeding, Bathing Bathing Assistance: Maximum assistance Feeding assistance: Limited assistance Dressing Assistance: Maximum assistance     Functional Limitations Info  Sight, Hearing, Speech Sight Info: Adequate Hearing Info: Adequate Speech Info: Adequate    SPECIAL CARE FACTORS FREQUENCY  PT (By licensed PT), OT (By licensed OT)     PT Frequency: 5x week OT Frequency: 5x week            Contractures      Additional Factors Info  Code Status, Allergies Code Status Info: Full Allergies Info: CODEINE, MORPHINE AND RELATED, SULFA ANTIBIOTICS, CEFTAROLINE            Current Medications (04/10/2018):  This is the current hospital active medication list Current Facility-Administered Medications  Medication Dose Route Frequency Provider Last Rate Last Dose  . 0.9 %  sodium chloride infusion  250 mL Intravenous PRN Prudencio Burly III, PA-C      . albuterol (PROVENTIL) (2.5 MG/3ML) 0.083% nebulizer solution 2.5 mg  2.5 mg Nebulization Q2H PRN Prudencio Burly III, PA-C      . Chlorhexidine Gluconate Cloth 2 % PADS 6 each  6 each Topical Daily Robbie Lis, MD   6 each at 04/09/18 1638  . docusate sodium (COLACE) capsule 100 mg  100 mg Oral BID Prudencio Burly III, PA-C   100 mg at 04/10/18 1884  . fentaNYL (SUBLIMAZE) injection 50 mcg  50 mcg Intravenous Q30 min PRN Prudencio Burly III, PA-C   50 mcg at 04/09/18 0617  . heparin injection 5,000 Units  5,000 Units Subcutaneous Q8H Prudencio Burly III, PA-C   5,000 Units at 04/10/18 1660  . hydrALAZINE (APRESOLINE) tablet 25 mg  25 mg Oral TID Prudencio Burly III, PA-C   Stopped at 04/08/18 2128  . HYDROmorphone (DILAUDID) injection 1 mg  1 mg Intravenous Q2H PRN Robbie Lis, MD      . lactated ringers infusion   Intravenous Continuous Prudencio Burly III, PA-C 50 mL/hr at 04/09/18 1231    . levothyroxine (SYNTHROID, LEVOTHROID) tablet 25  mcg  25 mcg Oral QAC breakfast Prudencio Burly III, PA-C   25 mcg at 04/10/18 4650  . methocarbamol (ROBAXIN) tablet 500 mg  500 mg Oral TID Prudencio Burly III, PA-C   500 mg at 04/10/18 0954  . metoprolol succinate (TOPROL-XL) 24 hr tablet 100 mg  100 mg Oral Daily Prudencio Burly III, PA-C   100 mg at 04/07/18 3546  . multivitamin with minerals tablet 1 tablet  1 tablet Oral Daily Prudencio Burly III, PA-C   1 tablet at 04/10/18 5681  . mupirocin ointment (BACTROBAN) 2 % 1 application  1 application Nasal BID Robbie Lis, MD   1 application at 27/51/70 [phone]  . ondansetron (ZOFRAN)  tablet 4 mg  4 mg Oral Q6H PRN Prudencio Burly III, PA-C       Or  . ondansetron [facility]) injection 4 mg  4 mg Intravenous Q6H PRN Prudencio Burly III, PA-C      . ondansetron [facility]) tablet 4 mg  4 mg Oral Q6H PRN Prudencio Burly III, PA-C   4 mg at 04/09/18 1510   Or  . ondansetron (ZOFRAN) injection 4 mg  4 mg Intravenous Q6H PRN Prudencio Burly III, PA-C      . oxyCODONE (Oxy IR/ROXICODONE) immediate release tablet 5 mg  5 mg Oral Q4H PRN Prudencio Burly III, PA-C   5 mg at 04/10/18 0954  . polyethylene glycol (MIRALAX / GLYCOLAX) packet 17 g  17 g Oral Daily PRN Prudencio Burly III, PA-C      . polyethylene glycol (MIRALAX / GLYCOLAX) packet 17 g  17 g Oral Daily Prudencio Burly III, PA-C   17 g at 04/10/18 0955  . senna (SENOKOT) tablet 8.6 mg  1 tablet Oral BID Prudencio Burly III, PA-C   8.6 mg at 04/10/18 0954  . sodium chloride flush (NS) 0.9 % injection 3 mL  3 mL Intravenous Q12H Prudencio Burly III, PA-C   3 mL at 04/10/18 1009  . sodium chloride flush (NS) 0.9 % injection 3 mL  3 mL Intravenous PRN Prudencio Burly III, PA-C   3 mL at 04/10/18 0957  . traZODone (DESYREL) tablet 50 mg  50 mg Oral QHS PRN Prudencio Burly III, PA-C      . umeclidinium bromide (INCRUSE ELLIPTA) 62.5 MCG/INH 1 puff  1 puff Inhalation Daily Prudencio Burly III, PA-C   1 puff at 04/10/18 9449     Discharge Medications: Please see discharge summary for a list of discharge medications.  Relevant Imaging Results:  Relevant Lab Results:   Additional Information SS#: [id number]  Normajean Baxter, LCSW

## 2018-04-10 NOTE — Plan of Care (Signed)
  Problem: Pain Managment: Goal: General experience of comfort will improve Outcome: Progressing   

## 2018-04-10 NOTE — Social Work (Signed)
CSW met with patient to discuss SNF offers. Pt accepted SNF bed at [facility]. CSW then confirmed offer with SNF.  CSW will f/u for disposition.  Elissa Hefty, LCSW Clinical Social Worker [phone]

## 2018-04-11 ENCOUNTER — Inpatient Hospital Stay (HOSPITAL_COMMUNITY): Payer: Medicare Other

## 2018-04-11 DIAGNOSIS — M6281 Muscle weakness (generalized): Secondary | ICD-10-CM | POA: Diagnosis not present

## 2018-04-11 DIAGNOSIS — S52021D Displaced fracture of olecranon process without intraarticular extension of right ulna, subsequent encounter for closed fracture with routine healing: Secondary | ICD-10-CM | POA: Diagnosis not present

## 2018-04-11 DIAGNOSIS — Z4889 Encounter for other specified surgical aftercare: Secondary | ICD-10-CM | POA: Diagnosis not present

## 2018-04-11 DIAGNOSIS — M79605 Pain in left leg: Secondary | ICD-10-CM | POA: Diagnosis not present

## 2018-04-11 DIAGNOSIS — R41841 Cognitive communication deficit: Secondary | ICD-10-CM | POA: Diagnosis not present

## 2018-04-11 DIAGNOSIS — S72001D Fracture of unspecified part of neck of right femur, subsequent encounter for closed fracture with routine healing: Secondary | ICD-10-CM | POA: Diagnosis not present

## 2018-04-11 DIAGNOSIS — Z4789 Encounter for other orthopedic aftercare: Secondary | ICD-10-CM | POA: Diagnosis not present

## 2018-04-11 DIAGNOSIS — R278 Other lack of coordination: Secondary | ICD-10-CM | POA: Diagnosis not present

## 2018-04-11 DIAGNOSIS — Z950 Presence of cardiac pacemaker: Secondary | ICD-10-CM | POA: Diagnosis not present

## 2018-04-11 DIAGNOSIS — F3289 Other specified depressive episodes: Secondary | ICD-10-CM | POA: Diagnosis not present

## 2018-04-11 DIAGNOSIS — R2681 Unsteadiness on feet: Secondary | ICD-10-CM | POA: Diagnosis not present

## 2018-04-11 DIAGNOSIS — M79672 Pain in left foot: Secondary | ICD-10-CM | POA: Diagnosis not present

## 2018-04-11 DIAGNOSIS — M25572 Pain in left ankle and joints of left foot: Secondary | ICD-10-CM | POA: Diagnosis not present

## 2018-04-11 DIAGNOSIS — I428 Other cardiomyopathies: Secondary | ICD-10-CM | POA: Diagnosis not present

## 2018-04-11 DIAGNOSIS — S72009D Fracture of unspecified part of neck of unspecified femur, subsequent encounter for closed fracture with routine healing: Secondary | ICD-10-CM | POA: Diagnosis not present

## 2018-04-11 DIAGNOSIS — M25521 Pain in right elbow: Secondary | ICD-10-CM | POA: Diagnosis not present

## 2018-04-11 DIAGNOSIS — S72001A Fracture of unspecified part of neck of right femur, initial encounter for closed fracture: Secondary | ICD-10-CM | POA: Diagnosis not present

## 2018-04-11 DIAGNOSIS — M109 Gout, unspecified: Secondary | ICD-10-CM | POA: Diagnosis not present

## 2018-04-11 DIAGNOSIS — I480 Paroxysmal atrial fibrillation: Secondary | ICD-10-CM | POA: Diagnosis not present

## 2018-04-11 MED ORDER — SENNA 8.6 MG PO TABS: 1.0000 | ORAL_TABLET | Freq: Two times a day (BID) | ORAL | 0 refills | Status: DC

## 2018-04-11 NOTE — Discharge Instructions (Signed)
Right Olecranon Fracture: Keep right arm splint on and dry Wear sling when ambulating  Do not bear weight with right arm Elevate right arm to reduce pain / swelling Move and open / close hand and fingers to reduce swelling

## 2018-04-11 NOTE — Plan of Care (Signed)
  Problem: Education: Goal: Knowledge of General Education information will improve Outcome: Progressing   Problem: Clinical Measurements: Goal: Ability to maintain clinical measurements within normal limits will improve Outcome: Progressing   Problem: Pain Managment: Goal: General experience of comfort will improve Outcome: Not Progressing

## 2018-04-11 NOTE — Clinical Social Work Placement (Signed)
   CLINICAL SOCIAL WORK PLACEMENT  NOTE  Date:  04/11/2018  Patient Details  Name: [name] MRN: [id number] Date of Birth: 1931/09/07  Clinical Social Work is seeking post-discharge placement for this patient at the [place] level of care (*CSW will initial, date and re-position this form in  chart as items are completed):  Yes   Patient/family provided with [facility] Work Department's list of facilities offering this level of care within the geographic area requested by the patient (or if unable, by the patient's family).  Yes   Patient/family informed of their freedom to choose among providers that offer the needed level of care, that participate in Medicare, Medicaid or managed care program needed by the patient, have an available bed and are willing to accept the patient.  Yes   Patient/family informed of [facility]'s ownership interest in [facility] and [facility], as well as of the fact that they are under no obligation to receive care at these facilities.  PASRR submitted to EDS on       PASRR number received on       Existing PASRR number confirmed on 04/10/18     FL2 transmitted to all facilities in geographic area requested by pt/family on       FL2 transmitted to all facilities within larger geographic area on 04/10/18     Patient informed that his/her managed care company has contracts with or will negotiate with certain facilities, including the following:        Yes   Patient/family informed of bed offers received.  Patient chooses bed at [place] [place]     Physician recommends and patient chooses bed at      Patient to be transferred to [place] on 04/11/18.  Patient to be transferred to facility by [facility]     Patient family notified on 04/11/18 of transfer.  Name of family member notified:  Spouse contacted     PHYSICIAN       Additional Comment:     _______________________________________________ Normajean Baxter, LCSW 04/11/2018, 10:14 AM

## 2018-04-11 NOTE — Progress Notes (Signed)
CSW noted medical team awaiting x-ray results. Patient will discharge to [facility] pending x-ray results. CSW to support with discharge when ready.  Estanislado Emms, [place]
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
## 2018-04-11 NOTE — Social Work (Addendum)
Clinical Social Worker facilitated patient discharge including contacting patient family and facility to confirm patient discharge plans.  Clinical information faxed to facility and family agreeable with plan.    CSW arranged ambulance transport via [facility] to [facility].    RN to call [phone] to give report prior to discharge. Pt going to Room 203.  Clinical Social Worker will sign off for now as social work intervention is no longer needed. Please consult [place] again if new need arises.  Elissa Hefty, LCSW Clinical Social Worker [phone]

## 2018-04-12 ENCOUNTER — Encounter: Payer: Self-pay | Admitting: Emergency Medicine

## 2018-04-15 DIAGNOSIS — M79672 Pain in left foot: Secondary | ICD-10-CM | POA: Diagnosis not present

## 2018-04-15 DIAGNOSIS — Z950 Presence of cardiac pacemaker: Secondary | ICD-10-CM | POA: Diagnosis not present

## 2018-04-15 DIAGNOSIS — I480 Paroxysmal atrial fibrillation: Secondary | ICD-10-CM | POA: Diagnosis not present

## 2018-04-15 DIAGNOSIS — I428 Other cardiomyopathies: Secondary | ICD-10-CM | POA: Diagnosis not present

## 2018-04-15 DIAGNOSIS — Z4889 Encounter for other specified surgical aftercare: Secondary | ICD-10-CM | POA: Diagnosis not present

## 2018-04-15 DIAGNOSIS — S52021D Displaced fracture of olecranon process without intraarticular extension of right ulna, subsequent encounter for closed fracture with routine healing: Secondary | ICD-10-CM | POA: Diagnosis not present

## 2018-04-18 DIAGNOSIS — S52021D Displaced fracture of olecranon process without intraarticular extension of right ulna, subsequent encounter for closed fracture with routine healing: Secondary | ICD-10-CM | POA: Diagnosis not present

## 2018-04-22 DIAGNOSIS — M109 Gout, unspecified: Secondary | ICD-10-CM | POA: Diagnosis not present

## 2018-04-22 DIAGNOSIS — F3289 Other specified depressive episodes: Secondary | ICD-10-CM | POA: Diagnosis not present

## 2018-04-22 DIAGNOSIS — M25521 Pain in right elbow: Secondary | ICD-10-CM | POA: Diagnosis not present

## 2018-05-12 DIAGNOSIS — I482 Chronic atrial fibrillation: Secondary | ICD-10-CM | POA: Diagnosis not present

## 2018-05-12 DIAGNOSIS — Z7901 Long term (current) use of anticoagulants: Secondary | ICD-10-CM | POA: Diagnosis not present

## 2018-05-12 DIAGNOSIS — M1991 Primary osteoarthritis, unspecified site: Secondary | ICD-10-CM | POA: Diagnosis not present

## 2018-05-12 DIAGNOSIS — Z89611 Acquired absence of right leg above knee: Secondary | ICD-10-CM | POA: Diagnosis not present

## 2018-05-12 DIAGNOSIS — M810 Age-related osteoporosis without current pathological fracture: Secondary | ICD-10-CM | POA: Diagnosis not present

## 2018-05-12 DIAGNOSIS — M109 Gout, unspecified: Secondary | ICD-10-CM | POA: Diagnosis not present

## 2018-05-12 DIAGNOSIS — I110 Hypertensive heart disease with heart failure: Secondary | ICD-10-CM | POA: Diagnosis not present

## 2018-05-12 DIAGNOSIS — I509 Heart failure, unspecified: Secondary | ICD-10-CM | POA: Diagnosis not present

## 2018-05-12 DIAGNOSIS — S52021D Displaced fracture of olecranon process without intraarticular extension of right ulna, subsequent encounter for closed fracture with routine healing: Secondary | ICD-10-CM | POA: Diagnosis not present

## 2018-05-12 DIAGNOSIS — S72001D Fracture of unspecified part of neck of right femur, subsequent encounter for closed fracture with routine healing: Secondary | ICD-10-CM | POA: Diagnosis not present

## 2018-05-12 DIAGNOSIS — Z9181 History of falling: Secondary | ICD-10-CM | POA: Diagnosis not present

## 2018-05-16 DIAGNOSIS — M25562 Pain in left knee: Secondary | ICD-10-CM | POA: Diagnosis not present

## 2018-05-16 DIAGNOSIS — S52021D Displaced fracture of olecranon process without intraarticular extension of right ulna, subsequent encounter for closed fracture with routine healing: Secondary | ICD-10-CM | POA: Diagnosis not present

## 2018-05-17 DIAGNOSIS — S72001D Fracture of unspecified part of neck of right femur, subsequent encounter for closed fracture with routine healing: Secondary | ICD-10-CM | POA: Diagnosis not present

## 2018-05-17 DIAGNOSIS — I110 Hypertensive heart disease with heart failure: Secondary | ICD-10-CM | POA: Diagnosis not present

## 2018-05-17 DIAGNOSIS — M109 Gout, unspecified: Secondary | ICD-10-CM | POA: Diagnosis not present

## 2018-05-17 DIAGNOSIS — I509 Heart failure, unspecified: Secondary | ICD-10-CM | POA: Diagnosis not present

## 2018-05-17 DIAGNOSIS — S52021D Displaced fracture of olecranon process without intraarticular extension of right ulna, subsequent encounter for closed fracture with routine healing: Secondary | ICD-10-CM | POA: Diagnosis not present

## 2018-05-17 DIAGNOSIS — I482 Chronic atrial fibrillation: Secondary | ICD-10-CM | POA: Diagnosis not present

## 2018-05-21 DIAGNOSIS — M109 Gout, unspecified: Secondary | ICD-10-CM | POA: Diagnosis not present

## 2018-05-21 DIAGNOSIS — S52021D Displaced fracture of olecranon process without intraarticular extension of right ulna, subsequent encounter for closed fracture with routine healing: Secondary | ICD-10-CM | POA: Diagnosis not present

## 2018-05-21 DIAGNOSIS — I509 Heart failure, unspecified: Secondary | ICD-10-CM | POA: Diagnosis not present

## 2018-05-21 DIAGNOSIS — I482 Chronic atrial fibrillation: Secondary | ICD-10-CM | POA: Diagnosis not present

## 2018-05-21 DIAGNOSIS — I110 Hypertensive heart disease with heart failure: Secondary | ICD-10-CM | POA: Diagnosis not present

## 2018-05-21 DIAGNOSIS — S72001D Fracture of unspecified part of neck of right femur, subsequent encounter for closed fracture with routine healing: Secondary | ICD-10-CM | POA: Diagnosis not present

## 2018-05-23 DIAGNOSIS — R0602 Shortness of breath: Secondary | ICD-10-CM | POA: Diagnosis not present

## 2018-05-23 DIAGNOSIS — S52021D Displaced fracture of olecranon process without intraarticular extension of right ulna, subsequent encounter for closed fracture with routine healing: Secondary | ICD-10-CM | POA: Diagnosis not present

## 2018-05-23 DIAGNOSIS — I5042 Chronic combined systolic (congestive) and diastolic (congestive) heart failure: Secondary | ICD-10-CM | POA: Diagnosis not present

## 2018-05-23 DIAGNOSIS — I509 Heart failure, unspecified: Secondary | ICD-10-CM | POA: Diagnosis not present

## 2018-05-23 DIAGNOSIS — I482 Chronic atrial fibrillation: Secondary | ICD-10-CM | POA: Diagnosis not present

## 2018-05-23 DIAGNOSIS — M109 Gout, unspecified: Secondary | ICD-10-CM | POA: Diagnosis not present

## 2018-05-23 DIAGNOSIS — Z9889 Other specified postprocedural states: Secondary | ICD-10-CM | POA: Diagnosis not present

## 2018-05-23 DIAGNOSIS — S72001D Fracture of unspecified part of neck of right femur, subsequent encounter for closed fracture with routine healing: Secondary | ICD-10-CM | POA: Diagnosis not present

## 2018-05-23 DIAGNOSIS — I110 Hypertensive heart disease with heart failure: Secondary | ICD-10-CM | POA: Diagnosis not present

## 2018-05-24 DIAGNOSIS — I10 Essential (primary) hypertension: Secondary | ICD-10-CM | POA: Diagnosis not present

## 2018-05-24 DIAGNOSIS — R2681 Unsteadiness on feet: Secondary | ICD-10-CM | POA: Diagnosis not present

## 2018-05-24 DIAGNOSIS — J449 Chronic obstructive pulmonary disease, unspecified: Secondary | ICD-10-CM | POA: Diagnosis not present

## 2018-05-24 DIAGNOSIS — I4891 Unspecified atrial fibrillation: Secondary | ICD-10-CM | POA: Diagnosis not present

## 2018-05-28 ENCOUNTER — Encounter (HOSPITAL_COMMUNITY): Payer: Self-pay | Admitting: Emergency Medicine

## 2018-05-28 ENCOUNTER — Emergency Department (HOSPITAL_COMMUNITY)
Admission: EM | Admit: 2018-05-28 | Discharge: 2018-05-28 | Disposition: A | Discharge status: Home / Self Care | Payer: Medicare Other | Attending: Emergency Medicine | Admitting: Emergency Medicine

## 2018-05-28 DIAGNOSIS — W2209XA Striking against other stationary object, initial encounter: Secondary | ICD-10-CM | POA: Insufficient documentation

## 2018-05-28 DIAGNOSIS — Y999 Unspecified external cause status: Secondary | ICD-10-CM | POA: Diagnosis not present

## 2018-05-28 DIAGNOSIS — S81812A Laceration without foreign body, left lower leg, initial encounter: Secondary | ICD-10-CM | POA: Insufficient documentation

## 2018-05-28 DIAGNOSIS — Z950 Presence of cardiac pacemaker: Secondary | ICD-10-CM | POA: Diagnosis not present

## 2018-05-28 DIAGNOSIS — Z853 Personal history of malignant neoplasm of breast: Secondary | ICD-10-CM | POA: Diagnosis not present

## 2018-05-28 DIAGNOSIS — L539 Erythematous condition, unspecified: Secondary | ICD-10-CM | POA: Diagnosis not present

## 2018-05-28 DIAGNOSIS — J45909 Unspecified asthma, uncomplicated: Secondary | ICD-10-CM | POA: Diagnosis not present

## 2018-05-28 DIAGNOSIS — Z79899 Other long term (current) drug therapy: Secondary | ICD-10-CM | POA: Diagnosis not present

## 2018-05-28 DIAGNOSIS — I110 Hypertensive heart disease with heart failure: Secondary | ICD-10-CM | POA: Insufficient documentation

## 2018-05-28 DIAGNOSIS — Z96653 Presence of artificial knee joint, bilateral: Secondary | ICD-10-CM | POA: Diagnosis not present

## 2018-05-28 DIAGNOSIS — Y939 Activity, unspecified: Secondary | ICD-10-CM | POA: Diagnosis not present

## 2018-05-28 DIAGNOSIS — I5032 Chronic diastolic (congestive) heart failure: Secondary | ICD-10-CM | POA: Diagnosis not present

## 2018-05-28 DIAGNOSIS — Y929 Unspecified place or not applicable: Secondary | ICD-10-CM | POA: Diagnosis not present

## 2018-05-28 DIAGNOSIS — Z7901 Long term (current) use of anticoagulants: Secondary | ICD-10-CM | POA: Insufficient documentation

## 2018-05-28 DIAGNOSIS — S8992XA Unspecified injury of left lower leg, initial encounter: Secondary | ICD-10-CM | POA: Diagnosis present

## 2018-05-28 HISTORY — DX: Heart failure, unspecified: I50.9

## 2018-05-28 MED ORDER — DOXYCYCLINE HYCLATE 100 MG PO CAPS: 100.0000 mg | ORAL_CAPSULE | Freq: Two times a day (BID) | ORAL | 0 refills | Status: DC

## 2018-05-28 MED ORDER — DOXYCYCLINE HYCLATE 100 MG PO TABS
100.0000 mg | ORAL_TABLET | Freq: Once | ORAL | Status: AC
  Administered 2018-05-28: 100 mg via ORAL
  Filled 2018-05-28: qty 1

## 2018-05-28 NOTE — ED Provider Notes (Signed)
[facility] DEPT Provider Note   CSN: [id number] Arrival date & time: 05/28/18  1126     History   Chief Complaint Chief Complaint  Patient presents with  . Leg Pain    HPI [name] is a 84 y.o. female.  84 year old female with prior history of CHF, chronic A. Fib (on Eliquis), and hypertension who presents for evaluation of laceration to the left anterior lower leg.  Patient reports that 3 days prior she struck her left shin against a chair.  This resulted in a 1 inch laceration to the left anterior shin.  The patient reported that there was some initial bleeding at the time.  Following the injury the patient applied a wound dressing and was taking care of this at home.  She was concerned today when she noticed some redness around the wound.  She denies associated fever, increasing pain, or other emergent complaint.  She reports that her tetanus is up to date.   The history is provided by the patient.  Laceration   The incident occurred more than 2 days ago. The laceration is located on the left leg. The laceration is 2 cm in size. Injury mechanism: chair. The patient is experiencing no pain. The pain has been constant since onset. She reports no foreign bodies present.    Past Medical History:  Diagnosis Date  . Anemia   . Arthritis   . Arthritis pain   . Asthma   . Blood transfusion   . Bronchitis   . Cancer ([place])    lt breast  . CHF (congestive heart failure) ([place])   . Chronic atrial fibrillation ([place])   . Constipation   . Current use of long term anticoagulation   . Cystitis   . Heart block    Complete heart block post surgical requiring pacer  . Hemorrhoids   . Humerus shaft fracture    nonsurgical, June 2017  . Hypertension    Benign Essential Hypertension    . Hypertension   . Hypertr obst cardiomyop    Required septal myotomy  . Hypokalemia    Postoperative, improved  . Hyponatremia    Mild postoperative, improved  .  Memory difficulty   . Mitral regurgitation   . Pacemaker   . Urinary incontinence     Patient Active Problem List   Diagnosis Date Noted  . Rt Olecranon fracture 04/07/2018  . Essential hypertension 04/07/2018  . A-fib ([place]) 04/07/2018  . Rt Hip Fracture ([place]) 04/06/2018  . Obstructive lung disease (generalized) ([place]) 10/04/2017  . Acute on chronic congestive heart failure ([place])   . Humerus fracture 06/09/2016  . Hx of right BKA ([place]) 06/09/2016  . Acute respiratory failure ([place]) 06/09/2016  . Malnutrition of moderate degree 06/09/2016  . CHF exacerbation ([place]) 05/18/2015  . Diastolic CHF, acute on chronic ([facility]) 05/18/2015  . Unilateral AKA ([place]) 09/23/2014  . Atrial fibrillation ([place]) 07/03/2013  . Breast cancer, left breast ([place]) 04/24/2013  . Other symptoms involving cardiovascular system 12/22/2010  . PACEMAKER, PERMANENT 06/21/2010  . MITRAL REGURGITATION 06/17/2010  . Essential hypertension 06/17/2010  . HOCM (hypertrophic obstructive cardiomyopathy) ([place]) 06/17/2010  . HEART BLOCK 06/17/2010  . HEMORRHOIDS 06/17/2010  . BRONCHITIS 06/17/2010  . ASTHMA 06/17/2010  . CYSTITIS 06/17/2010  . URINARY INCONTINENCE 06/17/2010    Past Surgical History:  Procedure Laterality Date  . ABDOMINAL HYSTERECTOMY    . ABOVE KNEE LEG AMPUTATION Right   . Anterior cervical diskectomy and fusion  C-7  . BELOW KNEE LEG AMPUTATION Right   . BREAST BIOPSY     Left, negative  . BREAST LUMPECTOMY  2012   left breast  . cardiomyopathy    . COLONOSCOPY    . FEMUR FRACTURE SURGERY     Right  . FRACTURE SURGERY    . JOINT REPLACEMENT    . ORIF ELBOW FRACTURE Right 04/08/2018   Procedure: OPEN REDUCTION INTERNAL FIXATION (ORIF) ELBOW/OLECRANON FRACTURE;  Surgeon: Renette Butters, MD;  Location: [facility];  Service: Orthopedics;  Laterality: Right;  . PACEMAKER PLACEMENT  2006/11-27-2013   MDT dual chamber pacemaker implanted 2006 with gen change by Dr Lovena Le 11-27-2013  . PERMANENT  PACEMAKER GENERATOR CHANGE N/A 11/27/2013   Procedure: PERMANENT PACEMAKER GENERATOR CHANGE;  Surgeon: Evans Lance, MD;  Location: [facility] CATH LAB;  Service: Cardiovascular;  Laterality: N/A;  . REPLACEMENT TOTAL KNEE BILATERAL    . Septal myotomy    . SHOULDER SURGERY     Right  . WRIST FRACTURE SURGERY     Right     OB History   None      Home Medications    Prior to Admission medications   Medication Sig Start Date End Date Taking? Authorizing Provider  acetaminophen (TYLENOL) 500 MG tablet Take 1,000 mg by mouth every 6 (six) hours as needed for moderate pain.     [provider]  acetaminophen (TYLENOL) 500 MG tablet Take 1,000 mg by mouth every 6 (six) hours as needed for headache (pain).    [provider]  albuterol (PROAIR HFA) 108 (90 BASE) MCG/ACT inhaler Inhale 2 puffs into the lungs every 6 (six) hours as needed for wheezing. 06/05/14   Evans Lance, MD  albuterol (PROVENTIL) (2.5 MG/3ML) 0.083% nebulizer solution Take 3 mLs (2.5 mg total) by nebulization every 6 (six) hours as needed for wheezing or shortness of breath. 06/13/16   Thurnell Lose, MD  alendronate (FOSAMAX) 70 MG tablet Take 70 mg by mouth every 7 (seven) days. Take with a full glass of water on an empty stomach. Takes on Thursday    [provider]  alendronate (FOSAMAX) 70 MG tablet Take 70 mg by mouth every Thursday. 03/25/18   [provider]  apixaban (ELIQUIS) 2.5 MG TABS tablet Take 1 tablet (2.5 mg total) by mouth 2 (two) times daily. 10/01/14   Angiulli, Lavon Paganini, PA-C  apixaban (ELIQUIS) 2.5 MG TABS tablet Take 2.5 mg by mouth 2 (two) times daily.    [provider]  calcium citrate-vitamin D (CITRACAL+D) 315-200 MG-UNIT per tablet Take 1 tablet by mouth 2 (two) times daily. 10/01/14   Angiulli, Lavon Paganini, PA-C  Cyanocobalamin (VITAMIN B-12 PO) Take 1 tablet by mouth daily.    [provider]  cyclobenzaprine (FLEXERIL) 5 MG tablet Take 1 tablet  (5 mg total) by mouth daily. Patient taking differently: Take 5 mg by mouth daily as needed for muscle spasms.  10/01/14   Angiulli, Lavon Paganini, PA-C  cycloSPORINE (RESTASIS) 0.05 % ophthalmic emulsion Place 1 drop into both eyes 2 (two) times daily. 10/01/14   Angiulli, Lavon Paganini, PA-C  doxycycline (VIBRAMYCIN) 100 MG capsule Take 1 capsule (100 mg total) by mouth 2 (two) times daily. 05/28/18   Valarie Merino, MD  feeding supplement, ENSURE ENLIVE, (ENSURE ENLIVE) LIQD Take 237 mLs by mouth 2 (two) times daily between meals. 06/13/16   Thurnell Lose, MD  fluticasone (FLONASE) 50 MCG/ACT nasal spray Place 2 sprays  into both nostrils 2 (two) times daily as needed for allergies.     [provider]  furosemide (LASIX) 20 MG tablet Take 1 tablet (20 mg total) by mouth 2 (two) times daily. 10/01/14   Angiulli, Lavon Paganini, PA-C  furosemide (LASIX) 20 MG tablet Take 20 mg by mouth daily. 02/12/18   [provider]  guaiFENesin-dextromethorphan (ROBITUSSIN DM) 100-10 MG/5ML syrup Take 10 mLs by mouth every 4 (four) hours as needed for cough. 06/13/16   Thurnell Lose, MD  hydrALAZINE (APRESOLINE) 25 MG tablet Take 25 mg by mouth 3 (three) times daily. 02/02/18   [provider]  HYDROcodone-acetaminophen (NORCO) 5-325 MG tablet Take 1 tablet by mouth every 4 (four) hours as needed for moderate pain. 04/08/18   Prudencio Burly III, PA-C  levothyroxine (SYNTHROID, LEVOTHROID) 25 MCG tablet Take 25 mcg by mouth daily before breakfast.    [provider]  metoprolol succinate (TOPROL-XL) 100 MG 24 hr tablet Take 100 mg by mouth daily. 03/04/18   [provider]  mirtazapine (REMERON) 15 MG tablet Take 1 tablet (15 mg total) by mouth at bedtime. 10/01/14   Angiulli, Lavon Paganini, PA-C  Multiple Vitamin (MULTIVITAMIN WITH MINERALS) TABS tablet Take 1 tablet by mouth daily.    [provider]  Multiple Vitamin (MULTIVITAMIN) tablet Take 1 tablet by mouth 2  (two) times daily.     [provider]  Naphazoline HCl (CLEAR EYES OP) Place 1 drop into both eyes 2 (two) times daily as needed (dry eyes).    [provider]  Oxycodone HCl 10 MG TABS Take 1 tablet (10 mg total) by mouth every 6 (six) hours as needed. 06/13/16   Thurnell Lose, MD  potassium chloride SA (K-DUR,KLOR-CON) 20 MEQ tablet Take 1 tablet (20 mEq total) by mouth 2 (two) times daily. 10/01/14   Angiulli, Lavon Paganini, PA-C  senna (SENOKOT) 8.6 MG TABS tablet Take 1 tablet (8.6 mg total) by mouth 2 (two) times daily. 04/11/18   Robbie Lis, MD  traMADol (ULTRAM) 50 MG tablet Take 1 tablet (50 mg total) by mouth every 6 (six) hours as needed for moderate pain. 06/13/16   Thurnell Lose, MD  umeclidinium bromide (INCRUSE ELLIPTA) 62.5 MCG/INH AEPB Inhale 1 puff into the lungs daily. 11/16/17   Collene Gobble, MD  umeclidinium bromide (INCRUSE ELLIPTA) 62.5 MCG/INH AEPB Inhale 1 puff into the lungs daily.    [provider]    Family History Family History  Problem Relation Age of Onset  . Heart disease Mother   . Hypertension Mother   . Pancreatic cancer Mother   . Cancer Mother        pancreatic  . Pancreatic cancer Father   . Cancer Father        pancreatic  . Diabetes Brother   . Pancreatic cancer Other   . Heart disease Brother        Enlarged heart    Social History Social History   Tobacco Use  . Smoking status: Never Smoker  Substance Use Topics  . Alcohol use: Not Currently  . Drug use: Never     Allergies   Codeine; Morphine and related; Sulfa antibiotics; Ceftaroline; Codeine; Morphine; and Sulfa antibiotics   Review of Systems Review of Systems  Skin:       Laceration to left anterior shin   All other systems reviewed and are negative.    Physical Exam Updated Vital Signs BP 114/70 (BP  Location: Left Arm)   Pulse (!) 129   Temp 97.8 F (36.6 C) (Oral)   Resp 15   Ht 5' (1.524 m)   Wt 59 kg (130 lb)   SpO2 95%    BMI 25.39 kg/m   Physical Exam  Constitutional: She is oriented to person, place, and time. She appears well-developed and well-nourished. No distress.  HENT:  Head: Normocephalic and atraumatic.  Mouth/Throat: Oropharynx is clear and moist.  Eyes: Pupils are equal, round, and reactive to light. Conjunctivae and EOM are normal.  Neck: Normal range of motion. Neck supple.  Cardiovascular: Normal rate, regular rhythm and normal heart sounds.  Pulmonary/Chest: Effort normal and breath sounds normal. No respiratory distress.  Abdominal: Soft. She exhibits no distension. There is no tenderness.  Musculoskeletal: Normal range of motion. She exhibits no edema or deformity.  SP Right AKA  3 cm laceration noted to anterior left shin - mild surrounding erythema  (see photo below)  Neurological: She is alert and oriented to person, place, and time.  Skin: Skin is warm and dry.  Psychiatric: She has a normal mood and affect.  Nursing note and vitals reviewed.      ED Treatments / Results  Labs (all labs ordered are listed, but only abnormal results are displayed) Labs Reviewed - No data to display  EKG None  Radiology No results found.  Procedures Procedures (including critical care time)  Medications Ordered in ED Medications  doxycycline (VIBRA-TABS) tablet 100 mg (has no administration in time range)     Initial Impression / Assessment and Plan / ED Course  I have reviewed the triage vital signs and the nursing notes.  Pertinent labs & imaging results that were available during my care of the patient were reviewed by me and considered in my medical decision making (see chart for details).     MDM  Screen complete  Patient is presenting for evaluation of left lower leg laceration.  This laceration occurred 3 days prior.  Patient has mild erythema surrounding the wound.  Wound will heal by secondary intention. I suspect that some of her erythema be related to the  impact that occurred when she had the injury (accentuated by her concurrent Eliquis therapy). However, I will treat with antibiotics to cover a potential wound infection. Patient understands the need for close follow up. Strict return precautions given and understood. She declines further workup and/or ED treatment and desires DC home.   Final Clinical Impressions(s) / ED Diagnoses   Final diagnoses:  Laceration of left lower extremity, initial encounter    ED Discharge Orders        Ordered    doxycycline (VIBRAMYCIN) 100 MG capsule  2 times daily     05/28/18 1338       Valarie Merino, MD 05/28/18 1350

## 2018-05-28 NOTE — ED Triage Notes (Signed)
Per patient, states she injured her leg on her recliner yesterday-states laceration to lower left leg-states she woke up this am with left lower leg swollen and red

## 2018-05-28 NOTE — Discharge Instructions (Addendum)
Please return for any problem. Take antibiotics (doxycycline) as prescribed. Return for fever, increased pain, drainage from wound, or other emergency. Follow up with your regular care provider in 2-3 days.

## 2018-05-29 DIAGNOSIS — I509 Heart failure, unspecified: Secondary | ICD-10-CM | POA: Diagnosis not present

## 2018-05-29 DIAGNOSIS — S52021D Displaced fracture of olecranon process without intraarticular extension of right ulna, subsequent encounter for closed fracture with routine healing: Secondary | ICD-10-CM | POA: Diagnosis not present

## 2018-05-29 DIAGNOSIS — I110 Hypertensive heart disease with heart failure: Secondary | ICD-10-CM | POA: Diagnosis not present

## 2018-05-29 DIAGNOSIS — M109 Gout, unspecified: Secondary | ICD-10-CM | POA: Diagnosis not present

## 2018-05-29 DIAGNOSIS — I482 Chronic atrial fibrillation: Secondary | ICD-10-CM | POA: Diagnosis not present

## 2018-05-29 DIAGNOSIS — S72001D Fracture of unspecified part of neck of right femur, subsequent encounter for closed fracture with routine healing: Secondary | ICD-10-CM | POA: Diagnosis not present

## 2018-05-30 DIAGNOSIS — I110 Hypertensive heart disease with heart failure: Secondary | ICD-10-CM | POA: Diagnosis not present

## 2018-05-30 DIAGNOSIS — I509 Heart failure, unspecified: Secondary | ICD-10-CM | POA: Diagnosis not present

## 2018-05-30 DIAGNOSIS — I482 Chronic atrial fibrillation: Secondary | ICD-10-CM | POA: Diagnosis not present

## 2018-05-30 DIAGNOSIS — S52021D Displaced fracture of olecranon process without intraarticular extension of right ulna, subsequent encounter for closed fracture with routine healing: Secondary | ICD-10-CM | POA: Diagnosis not present

## 2018-05-30 DIAGNOSIS — M109 Gout, unspecified: Secondary | ICD-10-CM | POA: Diagnosis not present

## 2018-05-30 DIAGNOSIS — S72001D Fracture of unspecified part of neck of right femur, subsequent encounter for closed fracture with routine healing: Secondary | ICD-10-CM | POA: Diagnosis not present

## 2018-06-04 DIAGNOSIS — I110 Hypertensive heart disease with heart failure: Secondary | ICD-10-CM | POA: Diagnosis not present

## 2018-06-04 DIAGNOSIS — S72001D Fracture of unspecified part of neck of right femur, subsequent encounter for closed fracture with routine healing: Secondary | ICD-10-CM | POA: Diagnosis not present

## 2018-06-04 DIAGNOSIS — I509 Heart failure, unspecified: Secondary | ICD-10-CM | POA: Diagnosis not present

## 2018-06-04 DIAGNOSIS — S52021D Displaced fracture of olecranon process without intraarticular extension of right ulna, subsequent encounter for closed fracture with routine healing: Secondary | ICD-10-CM | POA: Diagnosis not present

## 2018-06-04 DIAGNOSIS — I482 Chronic atrial fibrillation: Secondary | ICD-10-CM | POA: Diagnosis not present

## 2018-06-04 DIAGNOSIS — M109 Gout, unspecified: Secondary | ICD-10-CM | POA: Diagnosis not present

## 2018-06-05 DIAGNOSIS — S52021D Displaced fracture of olecranon process without intraarticular extension of right ulna, subsequent encounter for closed fracture with routine healing: Secondary | ICD-10-CM | POA: Diagnosis not present

## 2018-06-05 DIAGNOSIS — M109 Gout, unspecified: Secondary | ICD-10-CM | POA: Diagnosis not present

## 2018-06-05 DIAGNOSIS — S72001D Fracture of unspecified part of neck of right femur, subsequent encounter for closed fracture with routine healing: Secondary | ICD-10-CM | POA: Diagnosis not present

## 2018-06-05 DIAGNOSIS — I509 Heart failure, unspecified: Secondary | ICD-10-CM | POA: Diagnosis not present

## 2018-06-05 DIAGNOSIS — I110 Hypertensive heart disease with heart failure: Secondary | ICD-10-CM | POA: Diagnosis not present

## 2018-06-05 DIAGNOSIS — I482 Chronic atrial fibrillation: Secondary | ICD-10-CM | POA: Diagnosis not present

## 2018-06-07 DIAGNOSIS — I509 Heart failure, unspecified: Secondary | ICD-10-CM | POA: Diagnosis not present

## 2018-06-07 DIAGNOSIS — S72001D Fracture of unspecified part of neck of right femur, subsequent encounter for closed fracture with routine healing: Secondary | ICD-10-CM | POA: Diagnosis not present

## 2018-06-07 DIAGNOSIS — I110 Hypertensive heart disease with heart failure: Secondary | ICD-10-CM | POA: Diagnosis not present

## 2018-06-07 DIAGNOSIS — I482 Chronic atrial fibrillation: Secondary | ICD-10-CM | POA: Diagnosis not present

## 2018-06-07 DIAGNOSIS — M109 Gout, unspecified: Secondary | ICD-10-CM | POA: Diagnosis not present

## 2018-06-07 DIAGNOSIS — S52021D Displaced fracture of olecranon process without intraarticular extension of right ulna, subsequent encounter for closed fracture with routine healing: Secondary | ICD-10-CM | POA: Diagnosis not present

## 2018-06-12 DIAGNOSIS — I482 Chronic atrial fibrillation: Secondary | ICD-10-CM | POA: Diagnosis not present

## 2018-06-12 DIAGNOSIS — S72001D Fracture of unspecified part of neck of right femur, subsequent encounter for closed fracture with routine healing: Secondary | ICD-10-CM | POA: Diagnosis not present

## 2018-06-12 DIAGNOSIS — I509 Heart failure, unspecified: Secondary | ICD-10-CM | POA: Diagnosis not present

## 2018-06-12 DIAGNOSIS — S52021D Displaced fracture of olecranon process without intraarticular extension of right ulna, subsequent encounter for closed fracture with routine healing: Secondary | ICD-10-CM | POA: Diagnosis not present

## 2018-06-12 DIAGNOSIS — I110 Hypertensive heart disease with heart failure: Secondary | ICD-10-CM | POA: Diagnosis not present

## 2018-06-12 DIAGNOSIS — M109 Gout, unspecified: Secondary | ICD-10-CM | POA: Diagnosis not present

## 2018-06-14 DIAGNOSIS — I110 Hypertensive heart disease with heart failure: Secondary | ICD-10-CM | POA: Diagnosis not present

## 2018-06-14 DIAGNOSIS — I482 Chronic atrial fibrillation: Secondary | ICD-10-CM | POA: Diagnosis not present

## 2018-06-14 DIAGNOSIS — S52021D Displaced fracture of olecranon process without intraarticular extension of right ulna, subsequent encounter for closed fracture with routine healing: Secondary | ICD-10-CM | POA: Diagnosis not present

## 2018-06-14 DIAGNOSIS — S72001D Fracture of unspecified part of neck of right femur, subsequent encounter for closed fracture with routine healing: Secondary | ICD-10-CM | POA: Diagnosis not present

## 2018-06-14 DIAGNOSIS — M109 Gout, unspecified: Secondary | ICD-10-CM | POA: Diagnosis not present

## 2018-06-14 DIAGNOSIS — I509 Heart failure, unspecified: Secondary | ICD-10-CM | POA: Diagnosis not present

## 2018-06-15 DIAGNOSIS — I482 Chronic atrial fibrillation: Secondary | ICD-10-CM | POA: Diagnosis not present

## 2018-06-15 DIAGNOSIS — S72001D Fracture of unspecified part of neck of right femur, subsequent encounter for closed fracture with routine healing: Secondary | ICD-10-CM | POA: Diagnosis not present

## 2018-06-15 DIAGNOSIS — M109 Gout, unspecified: Secondary | ICD-10-CM | POA: Diagnosis not present

## 2018-06-15 DIAGNOSIS — S52021D Displaced fracture of olecranon process without intraarticular extension of right ulna, subsequent encounter for closed fracture with routine healing: Secondary | ICD-10-CM | POA: Diagnosis not present

## 2018-06-15 DIAGNOSIS — I509 Heart failure, unspecified: Secondary | ICD-10-CM | POA: Diagnosis not present

## 2018-06-15 DIAGNOSIS — I110 Hypertensive heart disease with heart failure: Secondary | ICD-10-CM | POA: Diagnosis not present

## 2018-06-18 DIAGNOSIS — I482 Chronic atrial fibrillation: Secondary | ICD-10-CM | POA: Diagnosis not present

## 2018-06-18 DIAGNOSIS — I110 Hypertensive heart disease with heart failure: Secondary | ICD-10-CM | POA: Diagnosis not present

## 2018-06-18 DIAGNOSIS — I509 Heart failure, unspecified: Secondary | ICD-10-CM | POA: Diagnosis not present

## 2018-06-18 DIAGNOSIS — M109 Gout, unspecified: Secondary | ICD-10-CM | POA: Diagnosis not present

## 2018-06-18 DIAGNOSIS — S52021D Displaced fracture of olecranon process without intraarticular extension of right ulna, subsequent encounter for closed fracture with routine healing: Secondary | ICD-10-CM | POA: Diagnosis not present

## 2018-06-18 DIAGNOSIS — S72001D Fracture of unspecified part of neck of right femur, subsequent encounter for closed fracture with routine healing: Secondary | ICD-10-CM | POA: Diagnosis not present

## 2018-06-19 DIAGNOSIS — E039 Hypothyroidism, unspecified: Secondary | ICD-10-CM | POA: Diagnosis not present

## 2018-06-19 DIAGNOSIS — I10 Essential (primary) hypertension: Secondary | ICD-10-CM | POA: Diagnosis not present

## 2018-06-21 DIAGNOSIS — I110 Hypertensive heart disease with heart failure: Secondary | ICD-10-CM | POA: Diagnosis not present

## 2018-06-21 DIAGNOSIS — I509 Heart failure, unspecified: Secondary | ICD-10-CM | POA: Diagnosis not present

## 2018-06-21 DIAGNOSIS — S52021D Displaced fracture of olecranon process without intraarticular extension of right ulna, subsequent encounter for closed fracture with routine healing: Secondary | ICD-10-CM | POA: Diagnosis not present

## 2018-06-21 DIAGNOSIS — S72001D Fracture of unspecified part of neck of right femur, subsequent encounter for closed fracture with routine healing: Secondary | ICD-10-CM | POA: Diagnosis not present

## 2018-06-21 DIAGNOSIS — I482 Chronic atrial fibrillation: Secondary | ICD-10-CM | POA: Diagnosis not present

## 2018-06-21 DIAGNOSIS — M109 Gout, unspecified: Secondary | ICD-10-CM | POA: Diagnosis not present

## 2018-06-25 DIAGNOSIS — S72001D Fracture of unspecified part of neck of right femur, subsequent encounter for closed fracture with routine healing: Secondary | ICD-10-CM | POA: Diagnosis not present

## 2018-06-25 DIAGNOSIS — M109 Gout, unspecified: Secondary | ICD-10-CM | POA: Diagnosis not present

## 2018-06-25 DIAGNOSIS — I482 Chronic atrial fibrillation: Secondary | ICD-10-CM | POA: Diagnosis not present

## 2018-06-25 DIAGNOSIS — S52021D Displaced fracture of olecranon process without intraarticular extension of right ulna, subsequent encounter for closed fracture with routine healing: Secondary | ICD-10-CM | POA: Diagnosis not present

## 2018-06-25 DIAGNOSIS — I509 Heart failure, unspecified: Secondary | ICD-10-CM | POA: Diagnosis not present

## 2018-06-25 DIAGNOSIS — I110 Hypertensive heart disease with heart failure: Secondary | ICD-10-CM | POA: Diagnosis not present

## 2018-06-26 DIAGNOSIS — Z853 Personal history of malignant neoplasm of breast: Secondary | ICD-10-CM | POA: Diagnosis not present

## 2018-06-26 DIAGNOSIS — Z800 Family history of malignant neoplasm of digestive organs: Secondary | ICD-10-CM | POA: Diagnosis not present

## 2018-06-26 DIAGNOSIS — Z79899 Other long term (current) drug therapy: Secondary | ICD-10-CM | POA: Diagnosis not present

## 2018-06-26 DIAGNOSIS — C50911 Malignant neoplasm of unspecified site of right female breast: Secondary | ICD-10-CM | POA: Diagnosis not present

## 2018-06-26 DIAGNOSIS — I10 Essential (primary) hypertension: Secondary | ICD-10-CM | POA: Diagnosis not present

## 2018-06-26 DIAGNOSIS — E039 Hypothyroidism, unspecified: Secondary | ICD-10-CM | POA: Diagnosis not present

## 2018-06-26 DIAGNOSIS — Z89619 Acquired absence of unspecified leg above knee: Secondary | ICD-10-CM | POA: Diagnosis not present

## 2018-06-26 DIAGNOSIS — R2681 Unsteadiness on feet: Secondary | ICD-10-CM | POA: Diagnosis not present

## 2018-06-27 DIAGNOSIS — S52021D Displaced fracture of olecranon process without intraarticular extension of right ulna, subsequent encounter for closed fracture with routine healing: Secondary | ICD-10-CM | POA: Diagnosis not present

## 2018-06-27 DIAGNOSIS — Z853 Personal history of malignant neoplasm of breast: Secondary | ICD-10-CM | POA: Diagnosis not present

## 2018-06-27 DIAGNOSIS — C50911 Malignant neoplasm of unspecified site of right female breast: Secondary | ICD-10-CM | POA: Diagnosis not present

## 2018-06-28 DIAGNOSIS — I482 Chronic atrial fibrillation: Secondary | ICD-10-CM | POA: Diagnosis not present

## 2018-06-28 DIAGNOSIS — I110 Hypertensive heart disease with heart failure: Secondary | ICD-10-CM | POA: Diagnosis not present

## 2018-06-28 DIAGNOSIS — S72001D Fracture of unspecified part of neck of right femur, subsequent encounter for closed fracture with routine healing: Secondary | ICD-10-CM | POA: Diagnosis not present

## 2018-06-28 DIAGNOSIS — I509 Heart failure, unspecified: Secondary | ICD-10-CM | POA: Diagnosis not present

## 2018-06-28 DIAGNOSIS — S52021D Displaced fracture of olecranon process without intraarticular extension of right ulna, subsequent encounter for closed fracture with routine healing: Secondary | ICD-10-CM | POA: Diagnosis not present

## 2018-06-28 DIAGNOSIS — M109 Gout, unspecified: Secondary | ICD-10-CM | POA: Diagnosis not present

## 2018-07-02 DIAGNOSIS — I110 Hypertensive heart disease with heart failure: Secondary | ICD-10-CM | POA: Diagnosis not present

## 2018-07-02 DIAGNOSIS — S52021D Displaced fracture of olecranon process without intraarticular extension of right ulna, subsequent encounter for closed fracture with routine healing: Secondary | ICD-10-CM | POA: Diagnosis not present

## 2018-07-02 DIAGNOSIS — I509 Heart failure, unspecified: Secondary | ICD-10-CM | POA: Diagnosis not present

## 2018-07-02 DIAGNOSIS — I482 Chronic atrial fibrillation: Secondary | ICD-10-CM | POA: Diagnosis not present

## 2018-07-02 DIAGNOSIS — M109 Gout, unspecified: Secondary | ICD-10-CM | POA: Diagnosis not present

## 2018-07-02 DIAGNOSIS — S72001D Fracture of unspecified part of neck of right femur, subsequent encounter for closed fracture with routine healing: Secondary | ICD-10-CM | POA: Diagnosis not present

## 2018-07-05 DIAGNOSIS — I482 Chronic atrial fibrillation: Secondary | ICD-10-CM | POA: Diagnosis not present

## 2018-07-05 DIAGNOSIS — I110 Hypertensive heart disease with heart failure: Secondary | ICD-10-CM | POA: Diagnosis not present

## 2018-07-05 DIAGNOSIS — S52021D Displaced fracture of olecranon process without intraarticular extension of right ulna, subsequent encounter for closed fracture with routine healing: Secondary | ICD-10-CM | POA: Diagnosis not present

## 2018-07-05 DIAGNOSIS — S72001D Fracture of unspecified part of neck of right femur, subsequent encounter for closed fracture with routine healing: Secondary | ICD-10-CM | POA: Diagnosis not present

## 2018-07-05 DIAGNOSIS — I509 Heart failure, unspecified: Secondary | ICD-10-CM | POA: Diagnosis not present

## 2018-07-05 DIAGNOSIS — M109 Gout, unspecified: Secondary | ICD-10-CM | POA: Diagnosis not present

## 2018-08-30 DIAGNOSIS — N390 Urinary tract infection, site not specified: Secondary | ICD-10-CM | POA: Diagnosis not present

## 2018-10-10 DIAGNOSIS — H348312 Tributary (branch) retinal vein occlusion, right eye, stable: Secondary | ICD-10-CM | POA: Diagnosis not present

## 2018-10-10 DIAGNOSIS — H353132 Nonexudative age-related macular degeneration, bilateral, intermediate dry stage: Secondary | ICD-10-CM | POA: Diagnosis not present

## 2018-10-10 DIAGNOSIS — H40013 Open angle with borderline findings, low risk, bilateral: Secondary | ICD-10-CM | POA: Diagnosis not present

## 2018-10-10 DIAGNOSIS — H35033 Hypertensive retinopathy, bilateral: Secondary | ICD-10-CM | POA: Diagnosis not present

## 2018-10-16 DIAGNOSIS — Z79899 Other long term (current) drug therapy: Secondary | ICD-10-CM | POA: Diagnosis not present

## 2018-11-02 ENCOUNTER — Other Ambulatory Visit: Payer: Self-pay

## 2018-11-19 DIAGNOSIS — M79604 Pain in right leg: Secondary | ICD-10-CM | POA: Diagnosis not present

## 2018-11-19 DIAGNOSIS — Z89619 Acquired absence of unspecified leg above knee: Secondary | ICD-10-CM | POA: Diagnosis not present

## 2018-11-29 DIAGNOSIS — I5042 Chronic combined systolic (congestive) and diastolic (congestive) heart failure: Secondary | ICD-10-CM | POA: Diagnosis not present

## 2018-11-29 DIAGNOSIS — Z950 Presence of cardiac pacemaker: Secondary | ICD-10-CM | POA: Diagnosis not present

## 2018-11-29 DIAGNOSIS — Z45018 Encounter for adjustment and management of other part of cardiac pacemaker: Secondary | ICD-10-CM | POA: Diagnosis not present

## 2018-11-29 DIAGNOSIS — I472 Ventricular tachycardia: Secondary | ICD-10-CM | POA: Diagnosis not present

## 2018-12-20 DIAGNOSIS — E039 Hypothyroidism, unspecified: Secondary | ICD-10-CM | POA: Diagnosis not present

## 2018-12-20 DIAGNOSIS — I10 Essential (primary) hypertension: Secondary | ICD-10-CM | POA: Diagnosis not present

## 2018-12-21 DIAGNOSIS — I482 Chronic atrial fibrillation, unspecified: Secondary | ICD-10-CM | POA: Diagnosis not present

## 2018-12-27 DIAGNOSIS — Z89619 Acquired absence of unspecified leg above knee: Secondary | ICD-10-CM | POA: Diagnosis not present

## 2018-12-27 DIAGNOSIS — E039 Hypothyroidism, unspecified: Secondary | ICD-10-CM | POA: Diagnosis not present

## 2018-12-27 DIAGNOSIS — I10 Essential (primary) hypertension: Secondary | ICD-10-CM | POA: Diagnosis not present

## 2018-12-27 DIAGNOSIS — J449 Chronic obstructive pulmonary disease, unspecified: Secondary | ICD-10-CM | POA: Diagnosis not present

## 2019-01-03 ENCOUNTER — Encounter (HOSPITAL_COMMUNITY): Payer: Self-pay | Admitting: Emergency Medicine

## 2019-01-03 ENCOUNTER — Other Ambulatory Visit: Payer: Self-pay

## 2019-01-03 ENCOUNTER — Emergency Department (HOSPITAL_COMMUNITY)
Admission: EM | Admit: 2019-01-03 | Discharge: 2019-01-03 | Disposition: A | Discharge status: Home / Self Care | Payer: Medicare Other | Attending: Emergency Medicine | Admitting: Emergency Medicine

## 2019-01-03 DIAGNOSIS — Z5321 Procedure and treatment not carried out due to patient leaving prior to being seen by health care provider: Secondary | ICD-10-CM | POA: Diagnosis not present

## 2019-01-03 DIAGNOSIS — R2241 Localized swelling, mass and lump, right lower limb: Secondary | ICD-10-CM | POA: Diagnosis not present

## 2019-01-03 LAB — COMPREHENSIVE METABOLIC PANEL
ALT: 16 U/L (ref 0–44)
AST: 29 U/L (ref 15–41)
Albumin: 3.9 g/dL (ref 3.5–5.0)
Alkaline Phosphatase: 55 U/L (ref 38–126)
Anion gap: 8 (ref 5–15)
BUN: 16 mg/dL (ref 8–23)
CO2: 25 mmol/L (ref 22–32)
Calcium: 8.7 mg/dL — ABNORMAL LOW (ref 8.9–10.3)
Chloride: 107 mmol/L (ref 98–111)
Creatinine, Ser: 1.04 mg/dL — ABNORMAL HIGH (ref 0.44–1.00)
GFR calc Af Amer: 57 mL/min — ABNORMAL LOW (ref >60)
GFR calc non Af Amer: 49 mL/min — ABNORMAL LOW (ref >60)
Glucose, Bld: 118 mg/dL — ABNORMAL HIGH (ref 70–99)
Potassium: 4.5 mmol/L (ref 3.5–5.1)
Sodium: 140 mmol/L (ref 135–145)
Total Bilirubin: 1.5 mg/dL — ABNORMAL HIGH (ref 0.3–1.2)
Total Protein: 6.4 g/dL — ABNORMAL LOW (ref 6.5–8.1)

## 2019-01-03 LAB — I-STAT CG4 LACTIC ACID, ED: Lactic Acid, Venous: 1.46 mmol/L (ref 0.5–1.9)

## 2019-01-03 LAB — CBC WITH DIFFERENTIAL/PLATELET
Abs Immature Granulocytes: 0.05 10*3/uL (ref 0.00–0.07)
Basophils Absolute: 0.1 10*3/uL (ref 0.0–0.1)
Basophils Relative: 1 %
Eosinophils Absolute: 0.1 10*3/uL (ref 0.0–0.5)
Eosinophils Relative: 1 %
HCT: 40.8 % (ref 36.0–46.0)
Hemoglobin: 12.4 g/dL (ref 12.0–15.0)
Immature Granulocytes: 1 %
Lymphocytes Relative: 11 %
Lymphs Abs: 0.9 10*3/uL (ref 0.7–4.0)
MCH: 32.9 pg (ref 26.0–34.0)
MCHC: 30.4 g/dL (ref 30.0–36.0)
MCV: 108.2 fL — ABNORMAL HIGH (ref 80.0–100.0)
Monocytes Absolute: 0.7 10*3/uL (ref 0.1–1.0)
Monocytes Relative: 10 %
Neutro Abs: 5.8 10*3/uL (ref 1.7–7.7)
Neutrophils Relative %: 76 %
Platelets: 187 10*3/uL (ref 150–400)
RBC: 3.77 MIL/uL — ABNORMAL LOW (ref 3.87–5.11)
RDW: 15.7 % — ABNORMAL HIGH (ref 11.5–15.5)
WBC: 7.6 10*3/uL (ref 4.0–10.5)
nRBC: 0 % (ref 0.0–0.2)

## 2019-01-03 NOTE — ED Triage Notes (Signed)
Pt has artificial leg and went to the doctor on 12/24/2018 Patient states her right knee stump has been blistering. Warm to the touch and red. Noticed the blistering on the 6th when she took her artifical leg off

## 2019-01-10 DIAGNOSIS — I10 Essential (primary) hypertension: Secondary | ICD-10-CM | POA: Diagnosis not present

## 2019-01-10 DIAGNOSIS — T879 Unspecified complications of amputation stump: Secondary | ICD-10-CM | POA: Diagnosis not present

## 2019-01-10 DIAGNOSIS — L0390 Cellulitis, unspecified: Secondary | ICD-10-CM | POA: Diagnosis not present

## 2019-01-10 DIAGNOSIS — Z89611 Acquired absence of right leg above knee: Secondary | ICD-10-CM | POA: Diagnosis not present

## 2019-01-10 DIAGNOSIS — R739 Hyperglycemia, unspecified: Secondary | ICD-10-CM | POA: Diagnosis not present

## 2019-01-10 DIAGNOSIS — Z89619 Acquired absence of unspecified leg above knee: Secondary | ICD-10-CM | POA: Diagnosis not present

## 2019-01-17 DIAGNOSIS — I10 Essential (primary) hypertension: Secondary | ICD-10-CM | POA: Diagnosis not present

## 2019-01-17 DIAGNOSIS — J45909 Unspecified asthma, uncomplicated: Secondary | ICD-10-CM | POA: Diagnosis not present

## 2019-01-17 DIAGNOSIS — Z89619 Acquired absence of unspecified leg above knee: Secondary | ICD-10-CM | POA: Diagnosis not present

## 2019-01-17 DIAGNOSIS — T879 Unspecified complications of amputation stump: Secondary | ICD-10-CM | POA: Diagnosis not present

## 2019-01-28 ENCOUNTER — Encounter (HOSPITAL_COMMUNITY): Payer: Self-pay

## 2019-01-28 ENCOUNTER — Observation Stay (HOSPITAL_COMMUNITY)
Admission: EM | Admit: 2019-01-28 | Discharge: 2019-01-29 | Disposition: A | Discharge status: Home / Self Care | Payer: Medicare Other | Attending: Internal Medicine | Admitting: Internal Medicine

## 2019-01-28 ENCOUNTER — Emergency Department (HOSPITAL_COMMUNITY): Payer: Medicare Other

## 2019-01-28 DIAGNOSIS — I421 Obstructive hypertrophic cardiomyopathy: Secondary | ICD-10-CM | POA: Diagnosis not present

## 2019-01-28 DIAGNOSIS — Z79899 Other long term (current) drug therapy: Secondary | ICD-10-CM | POA: Diagnosis not present

## 2019-01-28 DIAGNOSIS — I130 Hypertensive heart and chronic kidney disease with heart failure and stage 1 through stage 4 chronic kidney disease, or unspecified chronic kidney disease: Secondary | ICD-10-CM | POA: Diagnosis not present

## 2019-01-28 DIAGNOSIS — E162 Hypoglycemia, unspecified: Secondary | ICD-10-CM | POA: Diagnosis present

## 2019-01-28 DIAGNOSIS — I495 Sick sinus syndrome: Secondary | ICD-10-CM | POA: Diagnosis not present

## 2019-01-28 DIAGNOSIS — I5033 Acute on chronic diastolic (congestive) heart failure: Secondary | ICD-10-CM | POA: Diagnosis present

## 2019-01-28 DIAGNOSIS — Z79890 Hormone replacement therapy (postmenopausal): Secondary | ICD-10-CM | POA: Insufficient documentation

## 2019-01-28 DIAGNOSIS — R350 Frequency of micturition: Secondary | ICD-10-CM | POA: Diagnosis present

## 2019-01-28 DIAGNOSIS — N1831 Chronic kidney disease, stage 3a: Secondary | ICD-10-CM | POA: Diagnosis present

## 2019-01-28 DIAGNOSIS — I509 Heart failure, unspecified: Secondary | ICD-10-CM

## 2019-01-28 DIAGNOSIS — Z89611 Acquired absence of right leg above knee: Secondary | ICD-10-CM | POA: Diagnosis not present

## 2019-01-28 DIAGNOSIS — I110 Hypertensive heart disease with heart failure: Secondary | ICD-10-CM | POA: Diagnosis not present

## 2019-01-28 DIAGNOSIS — I482 Chronic atrial fibrillation, unspecified: Secondary | ICD-10-CM | POA: Diagnosis not present

## 2019-01-28 DIAGNOSIS — S78119A Complete traumatic amputation at level between unspecified hip and knee, initial encounter: Secondary | ICD-10-CM

## 2019-01-28 DIAGNOSIS — H5789 Other specified disorders of eye and adnexa: Secondary | ICD-10-CM | POA: Diagnosis present

## 2019-01-28 DIAGNOSIS — Z950 Presence of cardiac pacemaker: Secondary | ICD-10-CM | POA: Diagnosis not present

## 2019-01-28 DIAGNOSIS — Z853 Personal history of malignant neoplasm of breast: Secondary | ICD-10-CM | POA: Insufficient documentation

## 2019-01-28 DIAGNOSIS — R0602 Shortness of breath: Secondary | ICD-10-CM | POA: Diagnosis not present

## 2019-01-28 DIAGNOSIS — Z7901 Long term (current) use of anticoagulants: Secondary | ICD-10-CM | POA: Insufficient documentation

## 2019-01-28 DIAGNOSIS — H57811 Brow ptosis, right: Secondary | ICD-10-CM | POA: Diagnosis not present

## 2019-01-28 DIAGNOSIS — N189 Chronic kidney disease, unspecified: Secondary | ICD-10-CM | POA: Diagnosis not present

## 2019-01-28 LAB — BASIC METABOLIC PANEL
Anion gap: 10 (ref 5–15)
BUN: 15 mg/dL (ref 8–23)
CO2: 24 mmol/L (ref 22–32)
Calcium: 9 mg/dL (ref 8.9–10.3)
Chloride: 106 mmol/L (ref 98–111)
Creatinine, Ser: 1.06 mg/dL — ABNORMAL HIGH (ref 0.44–1.00)
GFR calc Af Amer: 55 mL/min — ABNORMAL LOW (ref >60)
GFR calc non Af Amer: 48 mL/min — ABNORMAL LOW (ref >60)
Glucose, Bld: 85 mg/dL (ref 70–99)
Potassium: 4.4 mmol/L (ref 3.5–5.1)
Sodium: 140 mmol/L (ref 135–145)

## 2019-01-28 LAB — CBC
HCT: 43.5 % (ref 36.0–46.0)
Hemoglobin: 13.7 g/dL (ref 12.0–15.0)
MCH: 32.5 pg (ref 26.0–34.0)
MCHC: 31.5 g/dL (ref 30.0–36.0)
MCV: 103.1 fL — ABNORMAL HIGH (ref 80.0–100.0)
Platelets: 199 10*3/uL (ref 150–400)
RBC: 4.22 MIL/uL (ref 3.87–5.11)
RDW: 15 % (ref 11.5–15.5)
WBC: 7.3 10*3/uL (ref 4.0–10.5)
nRBC: 0 % (ref 0.0–0.2)

## 2019-01-28 LAB — I-STAT TROPONIN, ED: Troponin i, poc: 0.07 ng/mL (ref 0.00–0.08)

## 2019-01-28 LAB — BRAIN NATRIURETIC PEPTIDE: B Natriuretic Peptide: 2756.3 pg/mL — ABNORMAL HIGH (ref 0.0–100.0)

## 2019-01-28 MED ORDER — SODIUM CHLORIDE 0.9 % IV SOLN: 250.0000 mL | INTRAVENOUS | Status: DC | PRN

## 2019-01-28 MED ORDER — ALBUTEROL SULFATE (2.5 MG/3ML) 0.083% IN NEBU: 2.5000 mg | INHALATION_SOLUTION | Freq: Four times a day (QID) | RESPIRATORY_TRACT | Status: DC | PRN

## 2019-01-28 MED ORDER — ONDANSETRON HCL 4 MG/2ML IJ SOLN: 4.0000 mg | Freq: Four times a day (QID) | INTRAMUSCULAR | Status: DC | PRN

## 2019-01-28 MED ORDER — TETRACAINE HCL 0.5 % OP SOLN
1.0000 [drp] | Freq: Once | OPHTHALMIC | Status: AC
  Administered 2019-01-28: 1 [drp] via OPHTHALMIC
  Filled 2019-01-28: qty 4

## 2019-01-28 MED ORDER — SODIUM CHLORIDE 0.9% FLUSH
3.0000 mL | Freq: Once | INTRAVENOUS | Status: AC
  Administered 2019-01-28: 3 mL via INTRAVENOUS

## 2019-01-28 MED ORDER — APIXABAN 2.5 MG PO TABS
2.5000 mg | ORAL_TABLET | Freq: Two times a day (BID) | ORAL | Status: DC
  Administered 2019-01-28 – 2019-01-29 (×2): 2.5 mg via ORAL
  Filled 2019-01-28 (×2): qty 1

## 2019-01-28 MED ORDER — SODIUM CHLORIDE 0.9% FLUSH: 3.0000 mL | INTRAVENOUS | Status: DC | PRN

## 2019-01-28 MED ORDER — GUAIFENESIN-DM 100-10 MG/5ML PO SYRP: 10.0000 mL | ORAL_SOLUTION | ORAL | Status: DC | PRN

## 2019-01-28 MED ORDER — FUROSEMIDE 10 MG/ML IJ SOLN
40.0000 mg | Freq: Once | INTRAMUSCULAR | Status: AC
  Administered 2019-01-28: 40 mg via INTRAVENOUS
  Filled 2019-01-28: qty 4

## 2019-01-28 MED ORDER — FLUORESCEIN SODIUM 1 MG OP STRP
1.0000 | ORAL_STRIP | Freq: Once | OPHTHALMIC | Status: AC
  Administered 2019-01-28: 1 via OPHTHALMIC
  Filled 2019-01-28: qty 1

## 2019-01-28 MED ORDER — ARTIFICIAL TEARS OPHTHALMIC OINT
1.0000 "application " | TOPICAL_OINTMENT | Freq: Two times a day (BID) | OPHTHALMIC | Status: DC
  Administered 2019-01-28 – 2019-01-29 (×2): 1 via OPHTHALMIC
  Filled 2019-01-28 (×2): qty 3.5

## 2019-01-28 MED ORDER — AMIODARONE HCL 200 MG PO TABS
200.0000 mg | ORAL_TABLET | Freq: Every day | ORAL | Status: DC
  Administered 2019-01-29: 200 mg via ORAL
  Filled 2019-01-28: qty 1

## 2019-01-28 MED ORDER — HYDRALAZINE HCL 25 MG PO TABS
25.0000 mg | ORAL_TABLET | Freq: Every day | ORAL | Status: DC
  Administered 2019-01-29: 25 mg via ORAL
  Filled 2019-01-28: qty 1

## 2019-01-28 MED ORDER — LEVOTHYROXINE SODIUM 25 MCG PO TABS
25.0000 ug | ORAL_TABLET | Freq: Every day | ORAL | Status: DC
  Administered 2019-01-28: 25 ug via ORAL
  Filled 2019-01-28: qty 1

## 2019-01-28 MED ORDER — SODIUM CHLORIDE 0.9% FLUSH
3.0000 mL | Freq: Two times a day (BID) | INTRAVENOUS | Status: DC
  Administered 2019-01-28: 3 mL via INTRAVENOUS

## 2019-01-28 MED ORDER — ACETAMINOPHEN 500 MG PO TABS
1000.0000 mg | ORAL_TABLET | Freq: Four times a day (QID) | ORAL | Status: DC | PRN
  Administered 2019-01-29: 1000 mg via ORAL
  Filled 2019-01-28: qty 2

## 2019-01-28 MED ORDER — METOPROLOL SUCCINATE ER 100 MG PO TB24
100.0000 mg | ORAL_TABLET | Freq: Every day | ORAL | Status: DC
  Administered 2019-01-29: 100 mg via ORAL
  Filled 2019-01-28: qty 1

## 2019-01-28 MED ORDER — UMECLIDINIUM BROMIDE 62.5 MCG/INH IN AEPB
1.0000 | INHALATION_SPRAY | Freq: Two times a day (BID) | RESPIRATORY_TRACT | Status: DC | PRN
  Filled 2019-01-28: qty 7

## 2019-01-28 MED ORDER — CYCLOSPORINE 0.05 % OP EMUL
1.0000 [drp] | Freq: Two times a day (BID) | OPHTHALMIC | Status: DC
  Administered 2019-01-28 – 2019-01-29 (×2): 1 [drp] via OPHTHALMIC
  Filled 2019-01-28 (×2): qty 30

## 2019-01-28 MED ORDER — FUROSEMIDE 20 MG PO TABS
20.0000 mg | ORAL_TABLET | Freq: Two times a day (BID) | ORAL | Status: DC
  Administered 2019-01-28: 20 mg via ORAL
  Filled 2019-01-28 (×2): qty 1

## 2019-01-28 MED ORDER — OXYCODONE HCL 5 MG PO TABS: 10.0000 mg | ORAL_TABLET | Freq: Four times a day (QID) | ORAL | Status: DC | PRN

## 2019-01-28 MED ORDER — GABAPENTIN 100 MG PO CAPS
200.0000 mg | ORAL_CAPSULE | Freq: Two times a day (BID) | ORAL | Status: DC
  Administered 2019-01-28 – 2019-01-29 (×2): 200 mg via ORAL
  Filled 2019-01-28 (×2): qty 2

## 2019-01-28 MED ORDER — MIRTAZAPINE 15 MG PO TABS
15.0000 mg | ORAL_TABLET | Freq: Every day | ORAL | Status: DC
  Administered 2019-01-28: 15 mg via ORAL
  Filled 2019-01-28: qty 1

## 2019-01-28 MED ORDER — SENNA 8.6 MG PO TABS: 1.0000 | ORAL_TABLET | Freq: Every day | ORAL | Status: DC | PRN

## 2019-01-28 MED ORDER — CYCLOBENZAPRINE HCL 5 MG PO TABS: 5.0000 mg | ORAL_TABLET | Freq: Every day | ORAL | Status: DC | PRN

## 2019-01-28 MED ORDER — POTASSIUM CHLORIDE CRYS ER 20 MEQ PO TBCR
20.0000 meq | EXTENDED_RELEASE_TABLET | Freq: Two times a day (BID) | ORAL | Status: DC
  Administered 2019-01-28 – 2019-01-29 (×2): 20 meq via ORAL
  Filled 2019-01-28 (×2): qty 1

## 2019-01-28 NOTE — Progress Notes (Signed)
Admit  Pt arrived to unit via stretcher with EMT from ED. Pt was able to slide from stretcher to bed with no assist. Pt having no complaints of pain at this time and will continue to monitor.

## 2019-01-28 NOTE — H&P (Signed)
History and Physical    [name] IRC:[id number] DOB: 09/25/1931 DOA: 01/28/2019  PCP: Jani Gravel, MD  Patient coming from: home   I have personally briefly reviewed patient's old medical records available.   Chief Complaint: shortness of breath   HPI: [name] is a 85 y.o. female with medical history significant of chronic atrial fibrillation on beta-blockers and Eliquis, congestive heart failure with preserved ejection fraction, pacemaker in place, right above-knee amputation traumatic who presents to the emergency room with gradually worsening shortness of breath and worse since last night.  According to the patient, she was supposed to take diuretics, however she has not been taking it because she goes to bathroom every 5 minutes as she has 3 kidneys.  Patient states that she does not weigh herself.  She is supposed to take 20 mg of Lasix twice a day but she is not taking it.  She had some shortness of breath but it was manageable.  Last night, she started getting more short of breath, she could not lie flat in her bed so came to the hospital.  No chest pain.  No wheezing.  No recent change in medications.  No nausea vomiting abdominal pain.  Bowel and urine habits are normal.  Patient also had developed some red eye and dryness on her right side. ED Course: Hemodynamically stable.  Blood pressures are stable.  She is in sinus rhythm.  Electrolytes are normal.  Chest x-ray shows mild cardiomegaly no acute changes.  proBNP is elevated to thousand.  Review of Systems: As per HPI otherwise 10 point review of systems negative.    Past Medical History:  Diagnosis Date  . Anemia   . Arthritis   . Arthritis pain   . Asthma   . Blood transfusion   . Bronchitis   . Cancer ([place])    lt breast  . CHF (congestive heart failure) ([place])   . Chronic atrial fibrillation   . Constipation   . Current use of long term anticoagulation   . Cystitis   . Heart block    Complete heart block post  surgical requiring pacer  . Hemorrhoids   . Humerus shaft fracture    nonsurgical, June 2017  . Hypertension    Benign Essential Hypertension    . Hypertension   . Hypertr obst cardiomyop    Required septal myotomy  . Hypokalemia    Postoperative, improved  . Hyponatremia    Mild postoperative, improved  . Memory difficulty   . Mitral regurgitation   . Pacemaker   . Urinary incontinence     Past Surgical History:  Procedure Laterality Date  . ABDOMINAL HYSTERECTOMY    . ABOVE KNEE LEG AMPUTATION Right   . Anterior cervical diskectomy and fusion     C-7  . BELOW KNEE LEG AMPUTATION Right   . BREAST BIOPSY     Left, negative  . BREAST LUMPECTOMY  2012   left breast  . cardiomyopathy    . COLONOSCOPY    . FEMUR FRACTURE SURGERY     Right  . FRACTURE SURGERY    . JOINT REPLACEMENT    . ORIF ELBOW FRACTURE Right 04/08/2018   Procedure: OPEN REDUCTION INTERNAL FIXATION (ORIF) ELBOW/OLECRANON FRACTURE;  Surgeon: Renette Butters, MD;  Location: [facility];  Service: Orthopedics;  Laterality: Right;  . PACEMAKER PLACEMENT  2006/11-27-2013   MDT dual chamber pacemaker implanted 2006 with gen change by Dr Lovena Le 11-27-2013  . PERMANENT PACEMAKER GENERATOR  CHANGE N/A 11/27/2013   Procedure: PERMANENT PACEMAKER GENERATOR CHANGE;  Surgeon: Evans Lance, MD;  Location: [facility] CATH LAB;  Service: Cardiovascular;  Laterality: N/A;  . REPLACEMENT TOTAL KNEE BILATERAL    . Septal myotomy    . SHOULDER SURGERY     Right  . WRIST FRACTURE SURGERY     Right     reports that she has never smoked. She does not have any smokeless tobacco history on file. She reports previous alcohol use. She reports that she does not use drugs.  Allergies  Allergen Reactions  . Codeine Nausea And Vomiting  . Morphine And Related Nausea And Vomiting  . Sulfa Antibiotics Nausea And Vomiting  . Ceftaroline Rash  . Codeine Nausea And Vomiting  . Morphine Nausea And Vomiting  . Sulfa Antibiotics Nausea And  Vomiting and Nausea Only    Family History  Problem Relation Age of Onset  . Heart disease Mother   . Hypertension Mother   . Pancreatic cancer Mother   . Cancer Mother        pancreatic  . Pancreatic cancer Father   . Cancer Father        pancreatic  . Diabetes Brother   . Pancreatic cancer Other   . Heart disease Brother        Enlarged heart     Prior to Admission medications   Medication Sig Start Date End Date Taking? Authorizing Provider  acetaminophen (TYLENOL) 500 MG tablet Take 1,000 mg by mouth every 6 (six) hours as needed for headache (pain).   Yes [provider]  albuterol (PROAIR HFA) 108 (90 BASE) MCG/ACT inhaler Inhale 2 puffs into the lungs every 6 (six) hours as needed for wheezing. 06/05/14  Yes Evans Lance, MD  albuterol (PROVENTIL) (2.5 MG/3ML) 0.083% nebulizer solution Take 3 mLs (2.5 mg total) by nebulization every 6 (six) hours as needed for wheezing or shortness of breath. 06/13/16  Yes Thurnell Lose, MD  alendronate (FOSAMAX) 70 MG tablet Take 70 mg by mouth every Thursday. 03/25/18  Yes [provider]  amiodarone (PACERONE) 200 MG tablet Take 200 mg by mouth daily. 12/31/18  Yes [provider]  apixaban (ELIQUIS) 2.5 MG TABS tablet Take 1 tablet (2.5 mg total) by mouth 2 (two) times daily. Patient taking differently: Take 2.5 mg by mouth every morning.  10/01/14  Yes Angiulli, Lavon Paganini, PA-C  BEVESPI AEROSPHERE 9-4.8 MCG/ACT AERO Take 1 puff by mouth daily as needed for shortness of breath or wheezing. 12/31/18  Yes [provider]  Cyanocobalamin (VITAMIN B-12 PO) Take 1 tablet by mouth daily.   Yes [provider]  cycloSPORINE (RESTASIS) 0.05 % ophthalmic emulsion Place 1 drop into both eyes 2 (two) times daily. 10/01/14  Yes Angiulli, Lavon Paganini, PA-C  fluticasone (FLONASE) 50 MCG/ACT nasal spray Place 2 sprays into both nostrils 2 (two) times daily as needed for allergies.    Yes [provider]    guaiFENesin-dextromethorphan (ROBITUSSIN DM) 100-10 MG/5ML syrup Take 10 mLs by mouth every 4 (four) hours as needed for cough. 06/13/16  Yes Thurnell Lose, MD  hydrALAZINE (APRESOLINE) 25 MG tablet Take 25 mg by mouth daily.  02/02/18  Yes [provider]  levothyroxine (SYNTHROID, LEVOTHROID) 25 MCG tablet Take 25 mcg by mouth at bedtime.    Yes [provider]  metoprolol succinate (TOPROL-XL) 100 MG 24 hr tablet Take 100 mg by mouth daily. 03/04/18  Yes [provider]  Multiple  Vitamin (MULTIVITAMIN WITH MINERALS) TABS tablet Take 1 tablet by mouth daily.   Yes [provider]  potassium chloride SA (K-DUR,KLOR-CON) 20 MEQ tablet Take 1 tablet (20 mEq total) by mouth 2 (two) times daily. 10/01/14  Yes Angiulli, Lavon Paganini, PA-C  senna (SENOKOT) 8.6 MG TABS tablet Take 1 tablet (8.6 mg total) by mouth 2 (two) times daily. Patient taking differently: Take 1 tablet by mouth daily as needed for mild constipation.  04/11/18  Yes Robbie Lis, MD  umeclidinium bromide (INCRUSE ELLIPTA) 62.5 MCG/INH AEPB Inhale 1 puff into the lungs daily. Patient taking differently: Inhale 1 puff into the lungs 2 (two) times daily as needed (wheezing).  11/16/17  Yes Collene Gobble, MD  acetaminophen (TYLENOL) 500 MG tablet Take 1,000 mg by mouth every 6 (six) hours as needed for moderate pain.     [provider]  alendronate (FOSAMAX) 70 MG tablet Take 70 mg by mouth every 7 (seven) days. Take with a full glass of water on an empty stomach. Takes on Thursday    [provider]  apixaban (ELIQUIS) 2.5 MG TABS tablet Take 2.5 mg by mouth 2 (two) times daily.    [provider]  calcium citrate-vitamin D (CITRACAL+D) 315-200 MG-UNIT per tablet Take 1 tablet by mouth 2 (two) times daily. 10/01/14   Angiulli, Lavon Paganini, PA-C  cyclobenzaprine (FLEXERIL) 5 MG tablet Take 1 tablet (5 mg total) by mouth daily. Patient taking differently: Take 5 mg by mouth daily  as needed for muscle spasms.  10/01/14   Angiulli, Lavon Paganini, PA-C  doxycycline (VIBRAMYCIN) 100 MG capsule Take 1 capsule (100 mg total) by mouth 2 (two) times daily. 05/28/18   Valarie Merino, MD  furosemide (LASIX) 20 MG tablet Take 1 tablet (20 mg total) by mouth 2 (two) times daily. 10/01/14   Angiulli, Lavon Paganini, PA-C  furosemide (LASIX) 20 MG tablet Take 20 mg by mouth daily. 02/12/18   [provider]  gabapentin (NEURONTIN) 100 MG capsule Take 200 mg by mouth 2 (two) times daily. 12/27/18   [provider]  HYDROcodone-acetaminophen (NORCO) 5-325 MG tablet Take 1 tablet by mouth every 4 (four) hours as needed for moderate pain. 04/08/18   Prudencio Burly III, PA-C  mirtazapine (REMERON) 15 MG tablet Take 1 tablet (15 mg total) by mouth at bedtime. 10/01/14   Angiulli, Lavon Paganini, PA-C  Multiple Vitamin (MULTIVITAMIN) tablet Take 1 tablet by mouth 2 (two) times daily.     [provider]  Naphazoline HCl (CLEAR EYES OP) Place 1 drop into both eyes 2 (two) times daily as needed (dry eyes).    [provider]  Oxycodone HCl 10 MG TABS Take 1 tablet (10 mg total) by mouth every 6 (six) hours as needed. 06/13/16   Thurnell Lose, MD  traMADol (ULTRAM) 50 MG tablet Take 1 tablet (50 mg total) by mouth every 6 (six) hours as needed for moderate pain. 06/13/16   Thurnell Lose, MD  umeclidinium bromide (INCRUSE ELLIPTA) 62.5 MCG/INH AEPB Inhale 1 puff into the lungs daily.    [provider]    Physical Exam: Vitals:   01/28/19 1300 01/28/19 1330 01/28/19 1400 01/28/19 1500  BP: (!) 147/80 126/80 139/82 137/78  Pulse:    65  Resp:   14 16  Temp:      TempSrc:      SpO2:    97%    Constitutional: NAD, calm, comfortable Vitals:  01/28/19 1300 01/28/19 1330 01/28/19 1400 01/28/19 1500  BP: (!) 147/80 126/80 139/82 137/78  Pulse:    65  Resp:   14 16  Temp:      TempSrc:      SpO2:    97%   Eyes: PERRL, lids and conjunctivae  normal ENMT: Mucous membranes are moist. Posterior pharynx clear of any exudate or lesions.Normal dentition.  Neck: normal, supple, no masses, no thyromegaly Respiratory: clear to auscultation bilaterally, no wheezing, no crackles. Normal respiratory effort. No accessory muscle use.  Cardiovascular: Regular rate and rhythm, no murmurs / rubs / gallops.  1+ edema left leg.  Right above-knee amputation and stump clean and dry.  1+ edema on thighs.  2+ pedal pulses. No carotid bruits.  Pacemaker right precordium intact. Abdomen: no tenderness, no masses palpated. No hepatosplenomegaly. Bowel sounds positive.  Musculoskeletal: no clubbing / cyanosis. No joint deformity upper and lower extremities. Good ROM, no contractures. Normal muscle tone.  Skin: no rashes, lesions, ulcers. No induration Neurologic: CN 2-12 grossly intact. Sensation intact, DTR normal. Strength 5/5 in all 4.  Psychiatric: Normal judgment and insight. Alert and oriented x 3. Normal mood.     Labs on Admission: I have personally reviewed following labs and imaging studies  CBC: Recent Labs  Lab 01/28/19 1041  WBC 7.3  HGB 13.7  HCT 43.5  MCV 103.1*  PLT 517   Basic Metabolic Panel: Recent Labs  Lab 01/28/19 1041  NA 140  K 4.4  CL 106  CO2 24  GLUCOSE 85  BUN 15  CREATININE 1.06*  CALCIUM 9.0   GFR: CrCl cannot be calculated (Unknown ideal weight.). Liver Function Tests: No results for input(s): AST, ALT, ALKPHOS, BILITOT, PROT, ALBUMIN in the last 168 hours. No results for input(s): LIPASE, AMYLASE in the last 168 hours. No results for input(s): AMMONIA in the last 168 hours. Coagulation Profile: No results for input(s): INR, PROTIME in the last 168 hours. Cardiac Enzymes: No results for input(s): CKTOTAL, CKMB, CKMBINDEX, TROPONINI in the last 168 hours. BNP (last 3 results) No results for input(s): PROBNP in the last 8760 hours. HbA1C: No results for input(s): HGBA1C in the last 72 hours. CBG: No  results for input(s): GLUCAP in the last 168 hours. Lipid Profile: No results for input(s): CHOL, HDL, LDLCALC, TRIG, CHOLHDL, LDLDIRECT in the last 72 hours. Thyroid Function Tests: No results for input(s): TSH, T4TOTAL, FREET4, T3FREE, THYROIDAB in the last 72 hours. Anemia Panel: No results for input(s): VITAMINB12, FOLATE, FERRITIN, TIBC, IRON, RETICCTPCT in the last 72 hours. Urine analysis:    Component Value Date/Time   COLORURINE AMBER (A) 04/07/2018 1846   APPEARANCEUR HAZY (A) 04/07/2018 1846   LABSPEC 1.018 04/07/2018 1846   PHURINE 5.0 04/07/2018 1846   GLUCOSEU NEGATIVE 04/07/2018 1846   HGBUR NEGATIVE 04/07/2018 1846   BILIRUBINUR NEGATIVE 04/07/2018 1846   KETONESUR NEGATIVE 04/07/2018 1846   PROTEINUR NEGATIVE 04/07/2018 1846   UROBILINOGEN 1.0 05/18/2015 0024   NITRITE NEGATIVE 04/07/2018 1846   LEUKOCYTESUR LARGE (A) 04/07/2018 1846    Radiological Exams on Admission: Dg Chest 2 View  Result Date: 01/28/2019 CLINICAL DATA:  Shortness of breath. EXAM: CHEST - 2 VIEW COMPARISON:  None. FINDINGS: Mild cardiomegaly is noted. Atherosclerosis of thoracic aorta is noted. Right-sided pacemaker is noted. Status post right shoulder arthroplasty. No pneumothorax is noted. Mild bibasilar subsegmental atelectasis is noted with minimal bilateral pleural effusions. IMPRESSION: Mild bibasilar subsegmental atelectasis is noted with minimal pleural effusions. Aortic Atherosclerosis (  ICD10-I70.0). Electronically Signed   By: Marijo Conception, M.D.   On: 01/28/2019 10:54    EKG: Independently reviewed. Ventricular paced rhythm.   Assessment/Plan Principal Problem:   Acute on chronic diastolic CHF (congestive heart failure) ([facility]) Active Problems:   Essential hypertension   HOCM (hypertrophic obstructive cardiomyopathy) ([facility])   HEART BLOCK   PACEMAKER, PERMANENT   Unilateral AKA ([facility])   A-fib ([facility])   CHF (congestive heart failure) ([place])     1.  Acute on chronic diastolic  heart failure: Due to not taking diuretics.  No echocardiogram since 2017.  Patient is currently fairly stable.  She is on room air.  Do think she has mild symptoms.  Given 1 dose of IV Lasix in the ER. Agree with observation in the cardiac telemetry. Patient is fairly euvolemic, will change to oral Lasix 20 mg twice a day that she is supposed to take. Heart failure education. ACE inhibitor's not indicated with ejection fraction more than 45%. Patient is on metoprolol that she will continue.  2.  Hypertension: Stable.  Resume home medications.  3.  Sick sinus syndrome status post pacemaker: Stable.  4.  Chronic A. fib: Rate controlled.  On amiodarone and metoprolol.  Continue.  On Eliquis dose adjusted that she will continue.    DVT prophylaxis: Eliquis. Code Status: Full code. Family Communication: Patient with medical decision making capacity. Disposition Plan: Home. Consults called: None. Admission status: Observation cardiac telemetry.   Barb Merino MD [facility] Pager [phone]  If 7PM-7AM, please contact night-coverage [email or web address] Password [facility]  01/28/2019, 3:14 PM

## 2019-01-28 NOTE — ED Provider Notes (Signed)
[facility] EMERGENCY DEPARTMENT Provider Note   CSN: [id number] Arrival date & time: 01/28/19  1026     History   Chief Complaint Chief Complaint  Patient presents with  . Shortness of Breath    HPI [name] is a 85 y.o. female.  HPI  Patient is 85 year old female with a history of heart failure with preserved ejection fraction, pacemaker in place, atrial fibrillation, anticoagulation on Eliquis presenting for shortness of breath.  Patient reports that this began yesterday at rest while she was watching TV.  She reports that she could not lay flat and did not get sleep last night due to the shortness of breath.  She reports that she is also had a couple weeks of progressive lower extremity edema in bilateral lower extremities.  Patient does have an AKA on right, and has been unable to fit her prosthetic leg on the last couple days.  Patient denies any recent respiratory infections, productive cough patient has any chest pain or palpitations.  Patient denies any nausea, vomiting, or abdominal pain.  Patient takes daily Lasix 20 mg and has not missed any doses.  No recent travel.  No recent changes in medication regimen or diuresis.  As a secondary issue, after patient was questioned about a red eye, she states that her right eye has been "feeling like something is in it" since this morning.  Denies known trauma to it.  She does have a history of dry eye for which she takes eyedrops, but denies any history of inflammatory eye disorders.  She thinks she may have rheumatoid arthritis.  She also thinks she may be taking daily prednisone but she is not sure.  She denies any drainage.  She reports mild blurred vision in that eye but no retro-orbital pain or pain with extraocular motions.  Past Medical History:  Diagnosis Date  . Anemia   . Arthritis   . Arthritis pain   . Asthma   . Blood transfusion   . Bronchitis   . Cancer ([place])    lt breast  . CHF (congestive  heart failure) ([place])   . Chronic atrial fibrillation   . Constipation   . Current use of long term anticoagulation   . Cystitis   . Heart block    Complete heart block post surgical requiring pacer  . Hemorrhoids   . Humerus shaft fracture    nonsurgical, June 2017  . Hypertension    Benign Essential Hypertension    . Hypertension   . Hypertr obst cardiomyop    Required septal myotomy  . Hypokalemia    Postoperative, improved  . Hyponatremia    Mild postoperative, improved  . Memory difficulty   . Mitral regurgitation   . Pacemaker   . Urinary incontinence     Patient Active Problem List   Diagnosis Date Noted  . Rt Olecranon fracture 04/07/2018  . Essential hypertension 04/07/2018  . A-fib ([place]) 04/07/2018  . Rt Hip Fracture ([place]) 04/06/2018  . Obstructive lung disease (generalized) ([place]) 10/04/2017  . Acute on chronic congestive heart failure ([place])   . Humerus fracture 06/09/2016  . Hx of right BKA ([place]) 06/09/2016  . Acute respiratory failure ([place]) 06/09/2016  . Malnutrition of moderate degree 06/09/2016  . CHF exacerbation ([place]) 05/18/2015  . Diastolic CHF, acute on chronic (H[facility]) 05/18/2015  . Unilateral AKA ([place]) 09/23/2014  . Atrial fibrillation ([name]) 07/03/2013  . Breast cancer, left breast ([place]) 04/24/2013  . Other symptoms involving cardiovascular  system 12/22/2010  . PACEMAKER, PERMANENT 06/21/2010  . MITRAL REGURGITATION 06/17/2010  . Essential hypertension 06/17/2010  . HOCM (hypertrophic obstructive cardiomyopathy) ([place]) 06/17/2010  . HEART BLOCK 06/17/2010  . HEMORRHOIDS 06/17/2010  . BRONCHITIS 06/17/2010  . ASTHMA 06/17/2010  . CYSTITIS 06/17/2010  . URINARY INCONTINENCE 06/17/2010    Past Surgical History:  Procedure Laterality Date  . ABDOMINAL HYSTERECTOMY    . ABOVE KNEE LEG AMPUTATION Right   . Anterior cervical diskectomy and fusion     C-7  . BELOW KNEE LEG AMPUTATION Right   . BREAST BIOPSY     Left, negative  . BREAST LUMPECTOMY   2012   left breast  . cardiomyopathy    . COLONOSCOPY    . FEMUR FRACTURE SURGERY     Right  . FRACTURE SURGERY    . JOINT REPLACEMENT    . ORIF ELBOW FRACTURE Right 04/08/2018   Procedure: OPEN REDUCTION INTERNAL FIXATION (ORIF) ELBOW/OLECRANON FRACTURE;  Surgeon: Renette Butters, MD;  Location: [facility];  Service: Orthopedics;  Laterality: Right;  . PACEMAKER PLACEMENT  2006/11-27-2013   MDT dual chamber pacemaker implanted 2006 with gen change by Dr Lovena Le 11-27-2013  . PERMANENT PACEMAKER GENERATOR CHANGE N/A 11/27/2013   Procedure: PERMANENT PACEMAKER GENERATOR CHANGE;  Surgeon: Evans Lance, MD;  Location: [facility] CATH LAB;  Service: Cardiovascular;  Laterality: N/A;  . REPLACEMENT TOTAL KNEE BILATERAL    . Septal myotomy    . SHOULDER SURGERY     Right  . WRIST FRACTURE SURGERY     Right     OB History   No obstetric history on file.      Home Medications    Prior to Admission medications   Medication Sig Start Date End Date Taking? Authorizing Provider  acetaminophen (TYLENOL) 500 MG tablet Take 1,000 mg by mouth every 6 (six) hours as needed for moderate pain.     [provider]  acetaminophen (TYLENOL) 500 MG tablet Take 1,000 mg by mouth every 6 (six) hours as needed for headache (pain).    [provider]  albuterol (PROAIR HFA) 108 (90 BASE) MCG/ACT inhaler Inhale 2 puffs into the lungs every 6 (six) hours as needed for wheezing. 06/05/14   Evans Lance, MD  albuterol (PROVENTIL) (2.5 MG/3ML) 0.083% nebulizer solution Take 3 mLs (2.5 mg total) by nebulization every 6 (six) hours as needed for wheezing or shortness of breath. 06/13/16   Thurnell Lose, MD  alendronate (FOSAMAX) 70 MG tablet Take 70 mg by mouth every 7 (seven) days. Take with a full glass of water on an empty stomach. Takes on Thursday    [provider]  alendronate (FOSAMAX) 70 MG tablet Take 70 mg by mouth every Thursday. 03/25/18   [provider]  apixaban  (ELIQUIS) 2.5 MG TABS tablet Take 1 tablet (2.5 mg total) by mouth 2 (two) times daily. 10/01/14   Angiulli, Lavon Paganini, PA-C  apixaban (ELIQUIS) 2.5 MG TABS tablet Take 2.5 mg by mouth 2 (two) times daily.    [provider]  calcium citrate-vitamin D (CITRACAL+D) 315-200 MG-UNIT per tablet Take 1 tablet by mouth 2 (two) times daily. 10/01/14   Angiulli, Lavon Paganini, PA-C  Cyanocobalamin (VITAMIN B-12 PO) Take 1 tablet by mouth daily.    [provider]  cyclobenzaprine (FLEXERIL) 5 MG tablet Take 1 tablet (5 mg total) by mouth daily. Patient taking differently: Take 5 mg by mouth daily as needed for muscle spasms.  10/01/14  Angiulli, Lavon Paganini, PA-C  cycloSPORINE (RESTASIS) 0.05 % ophthalmic emulsion Place 1 drop into both eyes 2 (two) times daily. 10/01/14   Angiulli, Lavon Paganini, PA-C  doxycycline (VIBRAMYCIN) 100 MG capsule Take 1 capsule (100 mg total) by mouth 2 (two) times daily. 05/28/18   Valarie Merino, MD  feeding supplement, ENSURE ENLIVE, (ENSURE ENLIVE) LIQD Take 237 mLs by mouth 2 (two) times daily between meals. 06/13/16   Thurnell Lose, MD  fluticasone (FLONASE) 50 MCG/ACT nasal spray Place 2 sprays into both nostrils 2 (two) times daily as needed for allergies.     [provider]  furosemide (LASIX) 20 MG tablet Take 1 tablet (20 mg total) by mouth 2 (two) times daily. 10/01/14   Angiulli, Lavon Paganini, PA-C  furosemide (LASIX) 20 MG tablet Take 20 mg by mouth daily. 02/12/18   [provider]  guaiFENesin-dextromethorphan (ROBITUSSIN DM) 100-10 MG/5ML syrup Take 10 mLs by mouth every 4 (four) hours as needed for cough. 06/13/16   Thurnell Lose, MD  hydrALAZINE (APRESOLINE) 25 MG tablet Take 25 mg by mouth 3 (three) times daily. 02/02/18   [provider]  HYDROcodone-acetaminophen (NORCO) 5-325 MG tablet Take 1 tablet by mouth every 4 (four) hours as needed for moderate pain. 04/08/18   Prudencio Burly III, PA-C  levothyroxine  (SYNTHROID, LEVOTHROID) 25 MCG tablet Take 25 mcg by mouth daily before breakfast.    [provider]  metoprolol succinate (TOPROL-XL) 100 MG 24 hr tablet Take 100 mg by mouth daily. 03/04/18   [provider]  mirtazapine (REMERON) 15 MG tablet Take 1 tablet (15 mg total) by mouth at bedtime. 10/01/14   Angiulli, Lavon Paganini, PA-C  Multiple Vitamin (MULTIVITAMIN WITH MINERALS) TABS tablet Take 1 tablet by mouth daily.    [provider]  Multiple Vitamin (MULTIVITAMIN) tablet Take 1 tablet by mouth 2 (two) times daily.     [provider]  Naphazoline HCl (CLEAR EYES OP) Place 1 drop into both eyes 2 (two) times daily as needed (dry eyes).    [provider]  Oxycodone HCl 10 MG TABS Take 1 tablet (10 mg total) by mouth every 6 (six) hours as needed. 06/13/16   Thurnell Lose, MD  potassium chloride SA (K-DUR,KLOR-CON) 20 MEQ tablet Take 1 tablet (20 mEq total) by mouth 2 (two) times daily. 10/01/14   Angiulli, Lavon Paganini, PA-C  senna (SENOKOT) 8.6 MG TABS tablet Take 1 tablet (8.6 mg total) by mouth 2 (two) times daily. 04/11/18   Robbie Lis, MD  traMADol (ULTRAM) 50 MG tablet Take 1 tablet (50 mg total) by mouth every 6 (six) hours as needed for moderate pain. 06/13/16   Thurnell Lose, MD  umeclidinium bromide (INCRUSE ELLIPTA) 62.5 MCG/INH AEPB Inhale 1 puff into the lungs daily. 11/16/17   Collene Gobble, MD  umeclidinium bromide (INCRUSE ELLIPTA) 62.5 MCG/INH AEPB Inhale 1 puff into the lungs daily.    [provider]    Family History Family History  Problem Relation Age of Onset  . Heart disease Mother   . Hypertension Mother   . Pancreatic cancer Mother   . Cancer Mother        pancreatic  . Pancreatic cancer Father   . Cancer Father        pancreatic  . Diabetes Brother   . Pancreatic cancer Other   . Heart disease Brother        Enlarged heart  Social History Social History   Tobacco Use  . Smoking status: Never  Smoker  Substance Use Topics  . Alcohol use: Not Currently  . Drug use: Never     Allergies   Codeine; Morphine and related; Sulfa antibiotics; Ceftaroline; Codeine; Morphine; and Sulfa antibiotics   Review of Systems Review of Systems  Constitutional: Negative for chills and fever.  HENT: Negative for congestion and sore throat.   Eyes: Negative for visual disturbance.  Respiratory: Positive for shortness of breath. Negative for cough and chest tightness.   Cardiovascular: Positive for leg swelling. Negative for chest pain and palpitations.  Gastrointestinal: Negative for abdominal pain, nausea and vomiting.  Genitourinary: Negative for dysuria and flank pain.  Musculoskeletal: Negative for back pain and myalgias.  Skin: Negative for rash.  Neurological: Negative for dizziness and syncope.     Physical Exam Updated Vital Signs BP (!) 150/96   Pulse 61   Temp 97.6 F (36.4 C) (Oral)   Resp 18   SpO2 95%   Physical Exam Vitals signs and nursing note reviewed.  Constitutional:      General: She is not in acute distress.    Appearance: She is well-developed.  HENT:     Head: Normocephalic and atraumatic.  Eyes:     Extraocular Movements: Extraocular movements intact.     Conjunctiva/sclera: Conjunctivae normal.     Pupils: Pupils are equal, round, and reactive to light.     Comments: Right eye examination: Patient has right injected sclera and conjunctiva.  Pupils equal, round, and reactive to light.  Extraocular motions intact.  No pain with extraocular motions.  No proptosis.  Tono-Pen pressure 16.  Slit-lamp examination significant for multiple punctate defects over the cornea, but no cell or flare.  No hyphema.  Patient's nasal conjunctiva appears hyperemic and mobile.  Neck:     Musculoskeletal: Normal range of motion and neck supple.  Cardiovascular:     Rate and Rhythm: Normal rate and regular rhythm.     Heart sounds: S1 normal and S2 normal. Murmur present.      Comments: Patient has left lower extremity pitting edema to the level of the mid tibia.  Right lower extremity status post AKA with mild swelling around the incision site.  No erythema bilaterally. Pulmonary:     Effort: Pulmonary effort is normal.     Breath sounds: Examination of the right-lower field reveals rales. Examination of the left-lower field reveals rales. Rales present. No wheezing.  Abdominal:     General: There is no distension.     Palpations: Abdomen is soft.     Tenderness: There is no abdominal tenderness. There is no guarding.  Musculoskeletal: Normal range of motion.        General: No deformity.     Right lower leg: Edema present.     Left lower leg: Edema present.     Comments: Status post right AKA.  Lymphadenopathy:     Cervical: No cervical adenopathy.  Skin:    General: Skin is warm and dry.     Findings: No erythema or rash.  Neurological:     Mental Status: She is alert.     Comments: Cranial nerves grossly intact. Patient moves extremities symmetrically and with good coordination.  Psychiatric:        Behavior: Behavior normal.        Thought Content: Thought content normal.        Judgment: Judgment normal.      ED  Treatments / Results  Labs (all labs ordered are listed, but only abnormal results are displayed) Labs Reviewed  BASIC METABOLIC PANEL - Abnormal; Notable for the following components:      Result Value   Creatinine, Ser 1.06 (*)    GFR calc non Af Amer 48 (*)    GFR calc Af Amer 55 (*)    All other components within normal limits  CBC - Abnormal; Notable for the following components:   MCV 103.1 (*)    All other components within normal limits  BRAIN NATRIURETIC PEPTIDE - Abnormal; Notable for the following components:   B Natriuretic Peptide 2,756.3 (*)    All other components within normal limits  I-STAT TROPONIN, ED    EKG EKG Interpretation  Date/Time:  Monday January 28 2019 10:33:28 EST Ventricular Rate:  88 PR  Interval:    QRS Duration: 190 QT Interval:  474 QTC Calculation: 573 R Axis:   -66 Text Interpretation:  Ventricular-paced rhythm Abnormal ECG similar to prior 6/17 Confirmed by Aletta Edouard [phone]) on 01/28/2019 11:24:47 AM   Radiology Dg Chest 2 View  Result Date: 01/28/2019 CLINICAL DATA:  Shortness of breath. EXAM: CHEST - 2 VIEW COMPARISON:  None. FINDINGS: Mild cardiomegaly is noted. Atherosclerosis of thoracic aorta is noted. Right-sided pacemaker is noted. Status post right shoulder arthroplasty. No pneumothorax is noted. Mild bibasilar subsegmental atelectasis is noted with minimal bilateral pleural effusions. IMPRESSION: Mild bibasilar subsegmental atelectasis is noted with minimal pleural effusions. Aortic Atherosclerosis (ICD10-I70.0). Electronically Signed   By: Marijo Conception, M.D.   On: 01/28/2019 10:54    Procedures Procedures (including critical care time)  Medications Ordered in ED Medications  sodium chloride flush (NS) 0.9 % injection 3 mL (has no administration in time range)  tetracaine (PONTOCAINE) 0.5 % ophthalmic solution 1 drop (has no administration in time range)  fluorescein ophthalmic strip 1 strip (has no administration in time range)  furosemide (LASIX) injection 40 mg (40 mg Intravenous Given 01/28/19 1228)     Initial Impression / Assessment and Plan / ED Course  I have reviewed the triage vital signs and the nursing notes.  Pertinent labs & imaging results that were available during my care of the patient were reviewed by me and considered in my medical decision making (see chart for details).  Clinical Course as of Jan 28 1325  Mon Jan 28, 6358  7423 85 year old female here with increased shortness of breath and orthopnea along with some lower extremity edema.  Her BNP is markedly elevated.  Denies any chest pain or fever.  Likely will need admission for diuresis.   [MB]  Bena with Dr. Sloan Leiter of [facility] who will admit  patient. Appreciate his involvement in the care of this patient.    [AM]    Clinical Course User Index [AM] Albesa Seen, PA-C [MB] Hayden Rasmussen, MD    Differential diagnosis includes HF exacerbation, reactive airway disease, COPD exacerbation, pneumonia, PE, ACS, cardiac tamponade, retropharyngeal abscess, neuromuscular causes (AIDP, myasthenia gravis, wound botulism, anemia, toxicologic (CO toxicity, MetHb, ASA overdose), metabolic acidosis.  Patient is nontoxic-appearing sitting up, hemodynamically stable, and not tachypneic or tachycardic.  Patient does have evidence of fluid overload on exam.  Lab work demonstrating negative troponin.  BNP is elevated above 2000, greater than prior.  Chest x-ray showing mild bibasilar pleural effusion.  Doubt pulmonary embolism given anticoagulation Eliquis.   Patient treatment for CHF exacerbation.  Will consult ophthalmology regarding red eye.  Spoke with Dr. Alanda Slim of ophthalmology and his recommendations are as follows:  -Examination is consistent with acute exacerbation of chronic dry eye.  Recommends Lacri-Lube twice daily in addition to Restasis. -Patient requires ophthalmology follow-up after she leaves the hospital.  She can either go to her optometrist if she has regular care or follow-up with Dr. Alanda Slim.  This is a shared visit with Dr. Aletta Edouard. Patient was independently evaluated by this attending physician. Attending physician consulted in evaluation and discharge management.  Final Clinical Impressions(s) / ED Diagnoses   Final diagnoses:  Acute on chronic congestive heart failure, unspecified heart failure type [facility])  Red eye    ED Discharge Orders    None       Tamala Julian 01/28/19 1504    Hayden Rasmussen, MD 01/28/19 1745

## 2019-01-28 NOTE — Plan of Care (Signed)
  Problem: Activity: Goal: Capacity to carry out activities will improve Outcome: Progressing   Problem: Clinical Measurements: Goal: Respiratory complications will improve Outcome: Progressing   Problem: Elimination: Goal: Will not experience complications related to bowel motility Outcome: Progressing

## 2019-01-28 NOTE — ED Triage Notes (Signed)
Patient reports that she developed SOB during the night and had to sleep sitting up all night. No pain, has noticed leg and feet swelling. Alert and oriented. States that she feels as if she cant get a deep good breath

## 2019-01-29 ENCOUNTER — Observation Stay (HOSPITAL_BASED_OUTPATIENT_CLINIC_OR_DEPARTMENT_OTHER): Payer: Medicare Other

## 2019-01-29 ENCOUNTER — Encounter (HOSPITAL_COMMUNITY): Payer: Self-pay | Admitting: Internal Medicine

## 2019-01-29 DIAGNOSIS — I4891 Unspecified atrial fibrillation: Secondary | ICD-10-CM

## 2019-01-29 DIAGNOSIS — I5043 Acute on chronic combined systolic (congestive) and diastolic (congestive) heart failure: Secondary | ICD-10-CM

## 2019-01-29 DIAGNOSIS — E162 Hypoglycemia, unspecified: Secondary | ICD-10-CM

## 2019-01-29 DIAGNOSIS — I130 Hypertensive heart and chronic kidney disease with heart failure and stage 1 through stage 4 chronic kidney disease, or unspecified chronic kidney disease: Secondary | ICD-10-CM | POA: Diagnosis not present

## 2019-01-29 DIAGNOSIS — I340 Nonrheumatic mitral (valve) insufficiency: Secondary | ICD-10-CM | POA: Diagnosis not present

## 2019-01-29 DIAGNOSIS — I361 Nonrheumatic tricuspid (valve) insufficiency: Secondary | ICD-10-CM | POA: Diagnosis not present

## 2019-01-29 DIAGNOSIS — S78119A Complete traumatic amputation at level between unspecified hip and knee, initial encounter: Secondary | ICD-10-CM

## 2019-01-29 DIAGNOSIS — Z950 Presence of cardiac pacemaker: Secondary | ICD-10-CM

## 2019-01-29 DIAGNOSIS — N189 Chronic kidney disease, unspecified: Secondary | ICD-10-CM | POA: Diagnosis present

## 2019-01-29 DIAGNOSIS — N1831 Chronic kidney disease, stage 3a: Secondary | ICD-10-CM | POA: Diagnosis present

## 2019-01-29 LAB — BASIC METABOLIC PANEL
Anion gap: 18 — ABNORMAL HIGH (ref 5–15)
BUN: 15 mg/dL (ref 8–23)
CO2: 25 mmol/L (ref 22–32)
Calcium: 9.2 mg/dL (ref 8.9–10.3)
Chloride: 101 mmol/L (ref 98–111)
Creatinine, Ser: 1.26 mg/dL — ABNORMAL HIGH (ref 0.44–1.00)
GFR calc Af Amer: 45 mL/min — ABNORMAL LOW (ref >60)
GFR calc non Af Amer: 39 mL/min — ABNORMAL LOW (ref >60)
Glucose, Bld: 57 mg/dL — ABNORMAL LOW (ref 70–99)
Potassium: 3.6 mmol/L (ref 3.5–5.1)
Sodium: 144 mmol/L (ref 135–145)

## 2019-01-29 LAB — ECHOCARDIOGRAM COMPLETE: Weight: 1622.59 oz

## 2019-01-29 LAB — GLUCOSE, CAPILLARY: Glucose-Capillary: 84 mg/dL (ref 70–99)

## 2019-01-29 MED ORDER — APIXABAN 2.5 MG PO TABS: 2.5000 mg | ORAL_TABLET | Freq: Two times a day (BID) | ORAL | 0 refills | Status: DC

## 2019-01-29 MED ORDER — SENNA 8.6 MG PO TABS: 1.0000 | ORAL_TABLET | Freq: Every day | ORAL | Status: DC | PRN

## 2019-01-29 MED ORDER — CYCLOBENZAPRINE HCL 5 MG PO TABS: 5.0000 mg | ORAL_TABLET | Freq: Every day | ORAL | 0 refills | Status: DC | PRN

## 2019-01-29 MED ORDER — UMECLIDINIUM BROMIDE 62.5 MCG/INH IN AEPB: 1.0000 | INHALATION_SPRAY | Freq: Two times a day (BID) | RESPIRATORY_TRACT | Status: DC | PRN

## 2019-01-29 MED ORDER — FUROSEMIDE 10 MG/ML IJ SOLN
40.0000 mg | Freq: Once | INTRAMUSCULAR | Status: AC
  Administered 2019-01-29: 40 mg via INTRAVENOUS
  Filled 2019-01-29: qty 4

## 2019-01-29 NOTE — Discharge Instructions (Signed)

## 2019-01-29 NOTE — Care Management Note (Signed)
Case Management Note  Patient Details  Name: [name] MRN: [id number] Date of Birth: 25-Jul-1931  Subjective/Objective:   CHF                Action/Plan: PCP: Jani Gravel, MD; has private insurance with Medicare; CM following for progression of care; awaiting for Physical Therapy recommendations.  Expected Discharge Date:      Possibly 02/01/2019            Expected Discharge Plan:  [place]  Discharge planning Services  CM Consult  Status of Service:  In process, will continue to follow  Sherrilyn Rist [phone] 01/29/2019, 8:38 AM

## 2019-01-29 NOTE — Discharge Summary (Signed)
Physician Discharge Summary  [name] WYO:[id number] DOB: 1931/10/11 DOA: 01/28/2019  PCP: Jani Gravel, MD  Admit date: 01/28/2019 Discharge date: 01/29/2019  Time spent: 45 minutes  Recommendations for Outpatient Follow-up:  1. Follow up with PCP Fredda Hammed PA 1/24 at 10:30 for post hospitalization visit. Recommend HgA1c, bmet to evaluate kidney function. Evaluate LLE wounds. Follow echo results 2. Home health PT   Discharge Diagnoses:  Principal Problem:   Acute on chronic diastolic CHF (congestive heart failure) (HCC) Active Problems:   Essential hypertension   A-fib (HCC)   CHF (congestive heart failure) (HCC)   HOCM (hypertrophic obstructive cardiomyopathy) (HCC)   PACEMAKER, PERMANENT   Unilateral AKA (HCC)   CKD (chronic kidney disease)   Hypoglycemia   Discharge Condition: stable  Diet recommendation: heart healthy  Filed Weights   01/29/19 0157  Weight: 46 kg    History of present illness:  85yo female presented to the ED 01/28/19 with increased SOB since previous night. Has stopped taking her diuretic due to frequent urination. She noted LLE edema and oozing and soon sob as well.  No acute cardiovascular changes noted in the ED. Admitted for acute on chronic heart failure. PMH breast CA, CHF, A-fib, HTN, memory difficulty, pacemaker/ICD, hx R AKA, B TKA, abrasions to LLE.   Hospital Course:     Acute on chronic diastolic heart failure: Due to not taking diuretics.  No echocardiogram since 2017. Echo done and results pending at discharge. Never hypoxic.  Given 1 dose of IV Lasix in the ER. Repeated in am 40mg  lasix IV. Instructed to resume home lasix dose tomorrow. Heart failure education. ACE inhibitor's not indicated with ejection fraction more than 45%. Patient is on metoprolol that she will continue. Follow up appointment 02/11/19.   2.  Hypertension: Stable.  Resumed home medications.  3.  Sick sinus syndrome status post pacemaker: Stable.  4.   Chronic A. fib: Rate controlled.  On amiodarone and metoprolol.  Continue.  On Eliquis dose adjusted that she will continue.  5. Hypoglycemia. Serum glucose 57 this am. Repeat 87. Chart review indicates baseline low end of normal. Follow up with PCP. Patient encouraged to eat/drink  6. CKD likely stage I-II. Creatinine 1.2 on discharge trending up slightly. Likely related to lasix given in hospital. Home medications include lasix as well. Recommend close OP follow up as patient becomes compliant with home lasix.  Procedures:  Echo results pending at discharge. To be followed by PCP  Consultations:  none  Discharge Exam: Vitals:   01/29/19 0534 01/29/19 1235  BP: (!) 142/80 135/79  Pulse: 66 75  Resp: 20 18  Temp: (!) 97.5 F (36.4 C)   SpO2: 92% 97%    General: awake alert no acute distress Cardiovascular: irregularly irregular Respiratory: mild increased work of breathing with repositioning in bed. BS clear bilaterally no wheeze  Discharge Instructions   Discharge Instructions    Call MD for:  persistant dizziness or light-headedness   Complete by:  As directed    Call MD for:  temperature >100.4   Complete by:  As directed    Diet - low sodium heart healthy   Complete by:  As directed    Discharge instructions   Complete by:  As directed    Has appointment with Fredda Hammed PA 02/11/19 at 10:30 am. Recommend bmet to track kidney function and A1c to evaluate serum glucose. Take medications as prescribed   Increase activity slowly   Complete by:  As  directed      Allergies as of 01/29/2019      Reactions   Codeine Nausea And Vomiting   Morphine And Related Nausea And Vomiting   Sulfa Antibiotics Nausea And Vomiting   Ceftaroline Rash   Codeine Nausea And Vomiting   Morphine Nausea And Vomiting   Sulfa Antibiotics Nausea And Vomiting, Nausea Only      Medication List    STOP taking these medications   HYDROcodone-acetaminophen 5-325 MG tablet Commonly  known as:  NORCO   mirtazapine 15 MG tablet Commonly known as:  REMERON   Oxycodone HCl 10 MG Tabs   traMADol 50 MG tablet Commonly known as:  ULTRAM     TAKE these medications   acetaminophen 500 MG tablet Commonly known as:  TYLENOL Take 1,000 mg by mouth every 6 (six) hours as needed for headache (pain).   albuterol 108 (90 Base) MCG/ACT inhaler Commonly known as:  PROAIR HFA Inhale 2 puffs into the lungs every 6 (six) hours as needed for wheezing.   albuterol (2.5 MG/3ML) 0.083% nebulizer solution Commonly known as:  PROVENTIL Take 3 mLs (2.5 mg total) by nebulization every 6 (six) hours as needed for wheezing or shortness of breath.   alendronate 70 MG tablet Commonly known as:  FOSAMAX Take 70 mg by mouth every Thursday.   amiodarone 200 MG tablet Commonly known as:  PACERONE Take 200 mg by mouth daily.   apixaban 2.5 MG Tabs tablet Commonly known as:  ELIQUIS Take 1 tablet (2.5 mg total) by mouth 2 (two) times daily. What changed:    when to take this  Another medication with the same name was removed. Continue taking this medication, and follow the directions you see here.   BEVESPI AEROSPHERE 9-4.8 MCG/ACT Aero Generic drug:  Glycopyrrolate-Formoterol Take 1 puff by mouth daily as needed for shortness of breath or wheezing.   calcium citrate-vitamin D 315-200 MG-UNIT tablet Commonly known as:  CITRACAL+D Take 1 tablet by mouth 2 (two) times daily.   cyclobenzaprine 5 MG tablet Commonly known as:  FLEXERIL Take 1 tablet (5 mg total) by mouth daily as needed for muscle spasms. What changed:    when to take this  reasons to take this   cycloSPORINE 0.05 % ophthalmic emulsion Commonly known as:  RESTASIS Place 1 drop into both eyes 2 (two) times daily.   fluticasone 50 MCG/ACT nasal spray Commonly known as:  FLONASE Place 2 sprays into both nostrils 2 (two) times daily as needed for allergies.   furosemide 20 MG tablet Commonly known as:   LASIX Take 1 tablet (20 mg total) by mouth 2 (two) times daily.   gabapentin 100 MG capsule Commonly known as:  NEURONTIN Take 200 mg by mouth 2 (two) times daily.   guaiFENesin-dextromethorphan 100-10 MG/5ML syrup Commonly known as:  ROBITUSSIN DM Take 10 mLs by mouth every 4 (four) hours as needed for cough.   hydrALAZINE 25 MG tablet Commonly known as:  APRESOLINE Take 25 mg by mouth daily.   levothyroxine 25 MCG tablet Commonly known as:  SYNTHROID, LEVOTHROID Take 25 mcg by mouth at bedtime.   metoprolol succinate 100 MG 24 hr tablet Commonly known as:  TOPROL-XL Take 100 mg by mouth daily.   multivitamin with minerals Tabs tablet Take 1 tablet by mouth daily.   potassium chloride SA 20 MEQ tablet Commonly known as:  K-DUR,KLOR-CON Take 1 tablet (20 mEq total) by mouth 2 (two) times daily.   senna 8.6 MG  Tabs tablet Commonly known as:  SENOKOT Take 1 tablet (8.6 mg total) by mouth daily as needed for mild constipation.   umeclidinium bromide 62.5 MCG/INH Aepb Commonly known as:  INCRUSE ELLIPTA Inhale 1 puff into the lungs 2 (two) times daily as needed (wheezing).   vitamin B-12 1000 MCG tablet Commonly known as:  CYANOCOBALAMIN Take 1,000 mcg by mouth daily.      Allergies  Allergen Reactions  . Codeine Nausea And Vomiting  . Morphine And Related Nausea And Vomiting  . Sulfa Antibiotics Nausea And Vomiting  . Ceftaroline Rash  . Codeine Nausea And Vomiting  . Morphine Nausea And Vomiting  . Sulfa Antibiotics Nausea And Vomiting and Nausea Only      The results of significant diagnostics from this hospitalization (including imaging, microbiology, ancillary and laboratory) are listed below for reference.    Significant Diagnostic Studies: Dg Chest 2 View  Result Date: 01/28/2019 CLINICAL DATA:  Shortness of breath. EXAM: CHEST - 2 VIEW COMPARISON:  None. FINDINGS: Mild cardiomegaly is noted. Atherosclerosis of thoracic aorta is noted. Right-sided  pacemaker is noted. Status post right shoulder arthroplasty. No pneumothorax is noted. Mild bibasilar subsegmental atelectasis is noted with minimal bilateral pleural effusions. IMPRESSION: Mild bibasilar subsegmental atelectasis is noted with minimal pleural effusions. Aortic Atherosclerosis (ICD10-I70.0). Electronically Signed   By: Marijo Conception, M.D.   On: 01/28/2019 10:54    Microbiology: No results found for this or any previous visit (from the past 240 hour(s)).   Labs: Basic Metabolic Panel: Recent Labs  Lab 01/28/19 1041 01/29/19 0456  NA 140 144  K 4.4 3.6  CL 106 101  CO2 24 25  GLUCOSE 85 57*  BUN 15 15  CREATININE 1.06* 1.26*  CALCIUM 9.0 9.2   Liver Function Tests: No results for input(s): AST, ALT, ALKPHOS, BILITOT, PROT, ALBUMIN in the last 168 hours. No results for input(s): LIPASE, AMYLASE in the last 168 hours. No results for input(s): AMMONIA in the last 168 hours. CBC: Recent Labs  Lab 01/28/19 1041  WBC 7.3  HGB 13.7  HCT 43.5  MCV 103.1*  PLT 199   Cardiac Enzymes: No results for input(s): CKTOTAL, CKMB, CKMBINDEX, TROPONINI in the last 168 hours. BNP: BNP (last 3 results) Recent Labs    01/28/19 1041  BNP 2,756.3*    ProBNP (last 3 results) No results for input(s): PROBNP in the last 8760 hours.  CBG: Recent Labs  Lab 01/29/19 1429  GLUCAP 84       Signed:  Radene Gunning NP [facility] 01/29/2019, 3:16 PM

## 2019-01-29 NOTE — Care Management Note (Signed)
Case Management Note  Patient Details  Name: ADALIS GATTI MRN: [id number] Date of Birth: 21-Apr-1931    Action/Plan: CM talked to patient with spouse at the bedside for [facility] choices, pt chose [place]; Dan with [facility] called for arrangements; DME - walker and cane at home;  Expected Discharge Date:  01/29/19               Expected Discharge Plan:  [place]  In-House Referral:    [facility] Discharge planning Services  CM Consult  HH Arranged:  RN, Disease Management, PT [place] [facility] Agency:  [facility]  Status of Service:  In process, will continue to follow  Sherrilyn Rist [phone] 01/29/2019, 3:36 PM

## 2019-01-29 NOTE — Evaluation (Signed)
Physical Therapy Evaluation Patient Details Name: [name] MRN: [id number] DOB: [birth year] Today's Date: 01/29/2019   History of Present Illness  85yo female presenting to the ED with increased SOB since last night. No acute cardiovascular changes noted in the ED. Admitted for acute on chronic heart failure. PMH breast CA, CHF, A-fib, HTN, memory difficulty, pacemaker/ICD, hx R AKA, B TKA   Clinical Impression   Patient received in bed, very pleasant and willing to participate in PT today; reports she does not have her prosthesis in hospital room due to swelling in her residual limb/prosthesis not currently fitting. Able to complete bed mobility with Mod(I), and squat-pivot transfers to and from bedside commode with close S, good awareness of safety and sequencing during transfer noted. Feel she will be able to return home with skilled HHPT once medically cleared. She was left in bed with all needs met and husband present this afternoon.     Follow Up Recommendations Home health PT    Equipment Recommendations  None recommended by PT    Recommendations for Other Services       Precautions / Restrictions Precautions Precautions: ICD/Pacemaker;Fall Restrictions Weight Bearing Restrictions: No      Mobility  Bed Mobility Overal bed mobility: Modified Independent             General bed mobility comments: extended time, no physical assist given   Transfers Overall transfer level: Needs assistance   Transfers: Squat Pivot Transfers     Squat pivot transfers: Supervision     General transfer comment: S for squat pivot transfers to and from commode, no physical assist given or cues for safety given   Ambulation/Gait             General Gait Details: unable to ambulate without prosthesis   Stairs            Wheelchair Mobility    Modified Rankin (Stroke Patients Only)       Balance Overall balance assessment: No apparent balance deficits (not  formally assessed)                                           Pertinent Vitals/Pain Pain Assessment: No/denies pain    Home Living Family/patient expects to be discharged to:: Private residence Living Arrangements: Spouse/significant other Available Help at Discharge: Family;Available 24 hours/day Type of Home: House Home Access: Stairs to enter Entrance Stairs-Rails: Can reach both Entrance Stairs-Number of Steps: 6 at front, flight at back door  Home Layout: Two level Home Equipment: [place] - 4 wheels;Bedside commode;Wheelchair - manual;Shower seat      Prior Function Level of Independence: Independent with assistive device(s)         Comments: WC for most mobility in/out of home. short distances to BR with 4WW and Rt prosthesis.      Hand Dominance        Extremity/Trunk Assessment   Upper Extremity Assessment Upper Extremity Assessment: Overall WFL for tasks assessed    Lower Extremity Assessment Lower Extremity Assessment: Overall WFL for tasks assessed    Cervical / Trunk Assessment Cervical / Trunk Assessment: Normal  Communication   Communication: No difficulties  Cognition Arousal/Alertness: Awake/alert Behavior During Therapy: WFL for tasks assessed/performed Overall Cognitive Status: Within Functional Limits for tasks assessed  General Comments      Exercises     Assessment/Plan    PT Assessment Patient needs continued PT services  PT Problem List Decreased strength;Decreased mobility;Decreased knowledge of use of DME;Decreased coordination;Decreased safety awareness       PT Treatment Interventions DME instruction;Functional mobility training;Balance training;Patient/family education;Gait training;Therapeutic activities;Neuromuscular re-education;Stair training;Therapeutic exercise;Cognitive remediation    PT Goals (Current goals can be found in the Care Plan section)   Acute Rehab PT Goals Patient Stated Goal: go home  PT Goal Formulation: With patient/family Time For Goal Achievement: 02/12/19 Potential to Achieve Goals: Good    Frequency Min 3X/week   Barriers to discharge        Co-evaluation               AM-PAC PT "6 Clicks" Mobility  Outcome Measure Help needed turning from your back to your side while in a flat bed without using bedrails?: None Help needed moving from lying on your back to sitting on the side of a flat bed without using bedrails?: None Help needed moving to and from a bed to a chair (including a wheelchair)?: None Help needed standing up from a chair using your arms (e.g., wheelchair or bedside chair)?: A Little Help needed to walk in hospital room?: A Little Help needed climbing 3-5 steps with a railing? : A Lot 6 Click Score: 20    End of Session   Activity Tolerance: Patient tolerated treatment well Patient left: in bed;with call bell/phone within reach;with family/visitor present   PT Visit Diagnosis: Muscle weakness (generalized) (M62.81);Other abnormalities of gait and mobility (R26.89);Difficulty in walking, not elsewhere classified (R26.2)    Time: 1152-1208 PT Time Calculation (min) (ACUTE ONLY): 16 min   Charges:   PT Evaluation $PT Eval Low Complexity: 1 Low         Deniece Ree PT, DPT, CBIS  Supplemental Physical Therapist [place]    Pager [phone] Acute Rehab Office [phone]

## 2019-01-29 NOTE — Progress Notes (Signed)
  Echocardiogram 2D Echocardiogram has been performed.  [name] 01/29/2019, 10:01 AM

## 2019-01-29 NOTE — Consult Note (Signed)
[place] Nurse wound consult note Reason for Consult:Partial thickness abrasions to LLE at anterior and lateral aspects in patient with previous traumatic amputation of RLE. Patient was not taking diuretic, noted [place]  LLE edema. Wound type:trauma Pressure Injury POA: NA Measurement: Anterior: 2cm x 1.8cm x 0.1cm Lateral: 3cm x 2cm x 0.1cm Wound bed: reepithelializing  Drainage (amount, consistency, odor) none Periwound: intact, mild edema, erythema, mild warmth Dressing procedure/placement/frequency: I am inclined to be more aggressive with this minor, partial thickness injury given her history and rather than patin the two areas with an antimicrobial swabstick twice daily, will cover lesions with antimicrobial and astringent xeroform and wrap in a dry boot (Kerlix and ACE) for mild compression.  Heel should be floated while in bed or chair.  As she pends a good deal of time in her recliner chair at home (the source of this injury), I have provided a pressure redistribution chair pad for pressure injury prevention at the sacrum and ischial tuberosities.  [place] nursing team will not follow, but will remain available to this patient, the nursing and medical teams.  Please re-consult if needed. Thanks, Maudie Flakes, MSN, RN, [place], Arther Abbott  Pager# [phone]

## 2019-02-04 ENCOUNTER — Other Ambulatory Visit: Payer: Self-pay

## 2019-02-04 NOTE — Patient Outreach (Addendum)
[facility]) Care Management  02/04/2019  [name] 1931/03/03 [id number]  EMMI: heart failure red alert Referral date: 02/04/19 Referral reason: weighted themselves: no,  New problems: yes, New worsening problems: yes, Nausea/ worsening shortness of breath: yes, nausea or vomiting: yes, tired/ fatigued: yes,  Lightheaded / dizzy: yes Insurance: Medicare Day # 2 and 3  Telephone call to patient regarding EMMi heart failure red alert. HIPAA verified with patient. Explained reason for call. Patient states she is not having any new or worsening problems, shortness of breath, nausea and vomiting or lightheaded / dizziness. Patient states she is mainly tired and having a hard time getting her strength back. Patient states she is not checking her weight daily.  She reports she does have a scale. Patient states she had a phone call from a home health agency. She states she is unsure who the agency was. Patient states she was told they would call back with visit date . Patient reports some of the medications listed on her medication list she is not taking. Patient states she is not taking a fluid pill. She states she was not given any prescriptions at discharge. Patient states she is taking her routine medications. RNCM advised patient to discuss / review her medications with her primary MD.   Patient states she has not scheduled a follow up visit with her primary MD since discharge from the hospital. RNCM offered to call and scheduled for patient. Patient verbally agreed.  RNCM discussed congestive heart failure symptoms. Discussed with patient importance of weighing and recording weights daily. Advised patient to call her doctor if she gains 3 lbs overnight or 5 lbs in a week. Discussed low salt diet with patient. Patient verbalized understanding.  Patient states she has 1 leg. She states she had an infection in the knee of her amputated leg approximately 3 weeks ago and has not been able  to wear her prosthesis. Patient states the swelling in the knee has decreased considerably and she thinks she will be able to wear her prosthesis. Marland Kitchen  RNCM called patients primary  MD office and spoke with [facility]. Appointment arrangements made for patient for Wednesday February 06, 2019 at 11:30 am.   [facility] contacted patient an informed her of appointment scheduled for 02/06/19 at 11:30am. Patient states she has transportation for appointment.   ASSESSMENT: Medication review completed with patient.    PLAN: RNCM will follow up with patient within 4 business days.  RNCM will send patient [facility] care management brochure/ magnet and EMMI education material on heart failure and low salt diet.   Quinn Plowman RN,BSN,CCM [facility] Telephonic  [phone]

## 2019-02-05 DIAGNOSIS — I5033 Acute on chronic diastolic (congestive) heart failure: Secondary | ICD-10-CM | POA: Diagnosis not present

## 2019-02-05 DIAGNOSIS — Z89612 Acquired absence of left leg above knee: Secondary | ICD-10-CM | POA: Diagnosis not present

## 2019-02-05 DIAGNOSIS — Z89611 Acquired absence of right leg above knee: Secondary | ICD-10-CM | POA: Diagnosis not present

## 2019-02-05 DIAGNOSIS — N182 Chronic kidney disease, stage 2 (mild): Secondary | ICD-10-CM | POA: Diagnosis not present

## 2019-02-05 DIAGNOSIS — I130 Hypertensive heart and chronic kidney disease with heart failure and stage 1 through stage 4 chronic kidney disease, or unspecified chronic kidney disease: Secondary | ICD-10-CM | POA: Diagnosis not present

## 2019-02-05 DIAGNOSIS — Z950 Presence of cardiac pacemaker: Secondary | ICD-10-CM | POA: Diagnosis not present

## 2019-02-05 DIAGNOSIS — Z79899 Other long term (current) drug therapy: Secondary | ICD-10-CM | POA: Diagnosis not present

## 2019-02-05 DIAGNOSIS — I482 Chronic atrial fibrillation, unspecified: Secondary | ICD-10-CM | POA: Diagnosis not present

## 2019-02-06 ENCOUNTER — Other Ambulatory Visit: Payer: Self-pay

## 2019-02-06 DIAGNOSIS — Z0000 Encounter for general adult medical examination without abnormal findings: Secondary | ICD-10-CM | POA: Diagnosis not present

## 2019-02-06 DIAGNOSIS — I421 Obstructive hypertrophic cardiomyopathy: Secondary | ICD-10-CM | POA: Diagnosis not present

## 2019-02-06 DIAGNOSIS — I4891 Unspecified atrial fibrillation: Secondary | ICD-10-CM | POA: Diagnosis not present

## 2019-02-06 DIAGNOSIS — M79669 Pain in unspecified lower leg: Secondary | ICD-10-CM | POA: Diagnosis not present

## 2019-02-06 DIAGNOSIS — N182 Chronic kidney disease, stage 2 (mild): Secondary | ICD-10-CM | POA: Diagnosis not present

## 2019-02-06 DIAGNOSIS — Z89619 Acquired absence of unspecified leg above knee: Secondary | ICD-10-CM | POA: Diagnosis not present

## 2019-02-06 DIAGNOSIS — I509 Heart failure, unspecified: Secondary | ICD-10-CM | POA: Diagnosis not present

## 2019-02-06 DIAGNOSIS — I5033 Acute on chronic diastolic (congestive) heart failure: Secondary | ICD-10-CM | POA: Diagnosis not present

## 2019-02-06 DIAGNOSIS — Z09 Encounter for follow-up examination after completed treatment for conditions other than malignant neoplasm: Secondary | ICD-10-CM | POA: Diagnosis not present

## 2019-02-06 DIAGNOSIS — E875 Hyperkalemia: Secondary | ICD-10-CM | POA: Diagnosis not present

## 2019-02-06 DIAGNOSIS — I10 Essential (primary) hypertension: Secondary | ICD-10-CM | POA: Diagnosis not present

## 2019-02-06 DIAGNOSIS — L03119 Cellulitis of unspecified part of limb: Secondary | ICD-10-CM | POA: Diagnosis not present

## 2019-02-06 DIAGNOSIS — Z950 Presence of cardiac pacemaker: Secondary | ICD-10-CM | POA: Diagnosis not present

## 2019-02-06 NOTE — Patient Outreach (Signed)
[facility]) Care Management  02/06/2019  ESMAE DONATHAN 09/30/1931 [id number]  EMMI: heart failure red alert Referral date: 02/06/19 Referral reason: weight 110 lbs Insurance: Medicare Day # 5 Attempt #1  Telephone call to patient regarding EMMI heart failure red alert  referral. Unable to reach patient. HIPAA compliant voice message left with call back phone number.   PLAN: RNCM will attempt 2nd telephone call to patient within 4 business days. RNCM will send outreach letter.   Quinn Plowman RN,BSN, [place] Telephonic  [phone]

## 2019-02-07 ENCOUNTER — Other Ambulatory Visit: Payer: Self-pay

## 2019-02-07 DIAGNOSIS — I130 Hypertensive heart and chronic kidney disease with heart failure and stage 1 through stage 4 chronic kidney disease, or unspecified chronic kidney disease: Secondary | ICD-10-CM | POA: Diagnosis not present

## 2019-02-07 DIAGNOSIS — I5033 Acute on chronic diastolic (congestive) heart failure: Secondary | ICD-10-CM | POA: Diagnosis not present

## 2019-02-07 DIAGNOSIS — Z89611 Acquired absence of right leg above knee: Secondary | ICD-10-CM | POA: Diagnosis not present

## 2019-02-07 DIAGNOSIS — Z89612 Acquired absence of left leg above knee: Secondary | ICD-10-CM | POA: Diagnosis not present

## 2019-02-07 DIAGNOSIS — N182 Chronic kidney disease, stage 2 (mild): Secondary | ICD-10-CM | POA: Diagnosis not present

## 2019-02-07 DIAGNOSIS — I482 Chronic atrial fibrillation, unspecified: Secondary | ICD-10-CM | POA: Diagnosis not present

## 2019-02-07 NOTE — Patient Outreach (Signed)
[facility]) Care Management  02/07/2019  [name] 1931-03-12 [id number]  EMMI: heart failure red alert Referral date: 02/06/19 Referral reason: weight 110 lbs, weight 114 lbs Insurance: Medicare Day # 5, #6  Telephone call to patient regarding EMMI heart failure red alert. HPAA verified with patient. Explained reason for call. Patient states the recordings for her weight are correct at 110 lbs on Tuesday 02/05/19 and 114 lbs on Wednesday 02/06/19.  Patient denies any additional symptoms of swelling or shortness of breath. Patient reports she saw her primary MD on yesterday. She states her doctor feels she is doing fine. Patient denies any changes with her medications or overall treatment regime. Patient states no follow up appointment was given.  Patient denied any further needs or concerns at this time.  RNCM advised patient that they would continue to get automated EMMI-GENERAL calls to assess how they are doing following recent hospitalization and will receive a call from a nurse if any of their responses were abnormal. Patient voiced understanding and was appreciative of follow up call. Patient verbally agreed to ongoing EMMI calls. RNCM advised patient to notify MD of any changes in condition prior to scheduled appointment. RNCM verified patient aware of 911 services for urgent/ emergent needs.  PLAN:  RNCM will close patient due to patient being assessed and having no further needs.  Quinn Plowman RN,BSN,CCM [facility] Telephonic  [phone]

## 2019-02-08 ENCOUNTER — Other Ambulatory Visit: Payer: Self-pay

## 2019-02-08 NOTE — Patient Outreach (Signed)
[place] [facility]) Care Management  02/08/2019  [name] 1931/07/07 [id number]   EMMI:heart failure red alert Referral date:02/06/19 Referral reason:weight 110 lbs, weight 114 lbs,  New worsening problems: yes,  New / worsening shortness of breath: yes Insurance: Medicare Day #5, #6, #7  Telephone call to patient regarding EMMI heart failure red alert. HIPAA verified with patient. Patient states she is doing fine. She states she is not having any new problems or shortness of breath. Patient states she answered no to the automated call when the questions were asked.  Patient states she is till ok with receiving the EMMI automated calls.  Patient  Denies any further needs or concerns.   PLAN; RNCm will close patient due to patient being assessed and having no further needs.   Quinn Plowman RN,BSN,CCM [facility] Telephonic  [phone]

## 2019-02-11 ENCOUNTER — Other Ambulatory Visit: Payer: Self-pay

## 2019-02-11 DIAGNOSIS — I482 Chronic atrial fibrillation, unspecified: Secondary | ICD-10-CM | POA: Diagnosis not present

## 2019-02-11 DIAGNOSIS — I5033 Acute on chronic diastolic (congestive) heart failure: Secondary | ICD-10-CM | POA: Diagnosis not present

## 2019-02-11 DIAGNOSIS — N182 Chronic kidney disease, stage 2 (mild): Secondary | ICD-10-CM | POA: Diagnosis not present

## 2019-02-11 DIAGNOSIS — I130 Hypertensive heart and chronic kidney disease with heart failure and stage 1 through stage 4 chronic kidney disease, or unspecified chronic kidney disease: Secondary | ICD-10-CM | POA: Diagnosis not present

## 2019-02-11 DIAGNOSIS — Z89612 Acquired absence of left leg above knee: Secondary | ICD-10-CM | POA: Diagnosis not present

## 2019-02-11 DIAGNOSIS — Z89611 Acquired absence of right leg above knee: Secondary | ICD-10-CM | POA: Diagnosis not present

## 2019-02-11 NOTE — Patient Outreach (Signed)
[facility]) Care Management  02/11/2019  [name] 08/06/1931 [id number]  EMMI: heart failure red alert.  Referral date: 02/11/19 Referral reason: Weight (lbs) 112 112 114  Any new problems? yes Yes    New/worsening problems?yes Yes No  New swelling? yes Yes    New/worsening shortness of breath? yes Yes    Had diarrhea or felt sick to stomach? yes Yes    Nausea or vomiting? yes Yes    Tired/fatigued? yes Yes    Lightheaded or dizzy? yes Yes    Fever or chills? yes Yes    Other symptoms/problems? Yes Yes     Insurance Medicare Day # 10  Telephone call to patient regarding heart failure red alert. HIPAA verified with patient. Explained reason for the call. Patient states she does not understand why the automated service cannot understand what she is saying.  Patient states she is not having problems in any of these area. She states she is doing fine.  Patient denies any futher needs or concerns.  RNCM advised patient to notify MD of any changes in condition prior to scheduled appointment. RNCM verified patient aware of 911 services for urgent/ emergent needs.,  Patient verbalized understanding.    PLAN: RNCM  Will close patient due to patient being assessed and having no further needs.   Quinn Plowman RN,BSN,CCM [facility] Telephonic  [phone]

## 2019-02-11 NOTE — Patient Outreach (Signed)
Patient triggered Red on EMMI Heart Failure Dashboard, notification sent to:  Davina Green, RN 

## 2019-02-12 ENCOUNTER — Other Ambulatory Visit: Payer: Self-pay

## 2019-02-12 NOTE — Patient Outreach (Signed)
[place]) Care Management  02/12/2019  DARREN NODAL [birth year] [id number]  EMMI: heart failure red alert Referral date: 02/12/19  Referral reason: New / worsening problems? , New swelling?, New/ worsening shortness of breath?  YES  Insurance: Medicare Day # 11  Telephone call to patient regarding EMMI heart failure red alert. HIPAA verified with patient. Explained reason for call. Patient states she is doing fine and not having any new/ worsening problems, swelling or shortness of breath.  Patient states she reported "No" to the questions on the automated service.  RNCM advised patient to notify MD of any changes in condition prior to scheduled appointment. RNCM verified patient aware of 911 services for urgent/ emergent needs.  PLAN: RNCm will close patient due to patient being assessed and having no further needs.   Quinn Plowman RN,BSN,CCM [facility] Telephonic  [phone]

## 2019-02-13 ENCOUNTER — Other Ambulatory Visit: Payer: Self-pay

## 2019-02-13 DIAGNOSIS — I482 Chronic atrial fibrillation, unspecified: Secondary | ICD-10-CM | POA: Diagnosis not present

## 2019-02-13 DIAGNOSIS — I5033 Acute on chronic diastolic (congestive) heart failure: Secondary | ICD-10-CM | POA: Diagnosis not present

## 2019-02-13 DIAGNOSIS — I130 Hypertensive heart and chronic kidney disease with heart failure and stage 1 through stage 4 chronic kidney disease, or unspecified chronic kidney disease: Secondary | ICD-10-CM | POA: Diagnosis not present

## 2019-02-13 DIAGNOSIS — N182 Chronic kidney disease, stage 2 (mild): Secondary | ICD-10-CM | POA: Diagnosis not present

## 2019-02-13 DIAGNOSIS — Z89612 Acquired absence of left leg above knee: Secondary | ICD-10-CM | POA: Diagnosis not present

## 2019-02-13 DIAGNOSIS — Z89611 Acquired absence of right leg above knee: Secondary | ICD-10-CM | POA: Diagnosis not present

## 2019-02-13 NOTE — Patient Outreach (Signed)
[place] [facility]) Care Management  02/13/2019  [name] Jul 02, 1931 [id number]  EMMI: heart failure red alert Referral date: 02/13/19 Referral reason: weighed themselves today: yes,  Any new problems: yes,  New/worsening problems: yes, New swelling: yes,  Nausea/ vomiting: yes,  Lightheaded or dizzy: Yes Insurance: Medicare Day # 12  Telephone call to patient regarding EMMI heart failure red alert. HIPAA verified with patient.  Explained reason for call.  Patient states she is not having any new symptoms. Patient states it is difficult for her to weigh because she only has one leg.  RNCM advised patient to wear prosthesis when she weighs. Patient states she would be steadier on the scale if she did this.  RNCM advised patient to weigh the prosthesis then weigh herself with prosthesis on  Then subtract weight of prosthesis from her total weight. Patient states she would try to do this for tomorrows weight.   Patient denied any further needs/ concerns at this time.   PLAN: RNCM will follow up with patient within 4 business days   Quinn Plowman RN,BSN,CCM [facility] Telephonic  [phone]

## 2019-02-14 ENCOUNTER — Other Ambulatory Visit: Payer: Self-pay

## 2019-02-14 NOTE — Patient Outreach (Signed)
[facility]) Care Management  02/14/2019  [name] Jan 11, 1931 [id number]  EMMI: heart failure red alert Referral date: 02/13/19 Referral reason: weighed themselves today: yes,  Any new problems: yes,  New/worsening problems: yes, Insurance: Medicare  Telephone call to patient regarding EMMI heart failure red alert. HIPAA verified with patient. Patient states her weight today is 112.  Patient states she weighed with her prosthesis then subtracted the weight of the prosthesis as advised. Patient denies having any new symptoms/ concerns.  Patient verbally agreed to stopping the EMMI heart failure automated calls. Patient verbally agreed to have follow up with RNCM.   PLAN; RNCM will follow up with patient within 1 week .  Quinn Plowman RN,BSN,CCM [facility] Telephonic  [phone]

## 2019-02-18 ENCOUNTER — Encounter: Payer: Self-pay | Admitting: Cardiology

## 2019-02-18 ENCOUNTER — Other Ambulatory Visit: Payer: Self-pay

## 2019-02-18 ENCOUNTER — Ambulatory Visit (INDEPENDENT_AMBULATORY_CARE_PROVIDER_SITE_OTHER): Payer: Medicare Other | Admitting: Cardiology

## 2019-02-18 VITALS — BP 114/65 | HR 79 | Ht 60.0 in | Wt 115.2 lb

## 2019-02-18 DIAGNOSIS — I5033 Acute on chronic diastolic (congestive) heart failure: Secondary | ICD-10-CM

## 2019-02-18 DIAGNOSIS — I130 Hypertensive heart and chronic kidney disease with heart failure and stage 1 through stage 4 chronic kidney disease, or unspecified chronic kidney disease: Secondary | ICD-10-CM | POA: Diagnosis not present

## 2019-02-18 DIAGNOSIS — I482 Chronic atrial fibrillation, unspecified: Secondary | ICD-10-CM

## 2019-02-18 DIAGNOSIS — Z8679 Personal history of other diseases of the circulatory system: Secondary | ICD-10-CM

## 2019-02-18 DIAGNOSIS — I10 Essential (primary) hypertension: Secondary | ICD-10-CM

## 2019-02-18 DIAGNOSIS — Z89612 Acquired absence of left leg above knee: Secondary | ICD-10-CM | POA: Diagnosis not present

## 2019-02-18 DIAGNOSIS — Z950 Presence of cardiac pacemaker: Secondary | ICD-10-CM | POA: Diagnosis not present

## 2019-02-18 DIAGNOSIS — I421 Obstructive hypertrophic cardiomyopathy: Secondary | ICD-10-CM

## 2019-02-18 DIAGNOSIS — I472 Ventricular tachycardia: Secondary | ICD-10-CM

## 2019-02-18 DIAGNOSIS — N182 Chronic kidney disease, stage 2 (mild): Secondary | ICD-10-CM | POA: Diagnosis not present

## 2019-02-18 DIAGNOSIS — Z89611 Acquired absence of right leg above knee: Secondary | ICD-10-CM | POA: Diagnosis not present

## 2019-02-18 DIAGNOSIS — R0989 Other specified symptoms and signs involving the circulatory and respiratory systems: Secondary | ICD-10-CM | POA: Insufficient documentation

## 2019-02-18 DIAGNOSIS — I4729 Other ventricular tachycardia: Secondary | ICD-10-CM

## 2019-02-18 MED ORDER — FUROSEMIDE 20 MG PO TABS: 20.0000 mg | ORAL_TABLET | Freq: Every day | ORAL | Status: DC

## 2019-02-18 MED ORDER — LISINOPRIL 10 MG PO TABS: 10.0000 mg | ORAL_TABLET | Freq: Every day | ORAL | 3 refills | Status: DC

## 2019-02-18 MED ORDER — POTASSIUM CHLORIDE CRYS ER 20 MEQ PO TBCR: 20.0000 meq | EXTENDED_RELEASE_TABLET | Freq: Every day | ORAL | Status: DC

## 2019-02-18 NOTE — Patient Outreach (Addendum)
[place] [facility]) Care Management  02/18/2019  [name] September 05, 1931 [id number]    EMMI-HF RED ON EMMI ALERT Day # 15 Date: 02/15/2019 Red Alert Reason: " Any new problems? Yes  New/worsening problems? Yes  New swelling? Yes  Other symptoms/problems? Yes"  Day # 16 Date: 02/16/2019 Red Alert Reason: "Weighed themselves today? No  New/worsening problems? Yes Questions about meds? Yes"   Outreach attempt #1 to patient. Spoke with patient who voices she is doing fine. She denies any acute issues or concerns at this time. Reviewed and addressed red alerts with patient. She reports that machine keeps hearing her incorrectly. She denies any of the above symptoms. Patient confirmed that her weight is stable at 112 lbs.  She denies any questions or concerns regarding her meds and voices she is adhering to med regimen. No further RN CM needs or concerns identified at this time.      Plan: Primary assigned RN CM will follow up with patient.   Enzo Montgomery, RN,BSN,CCM [place] [facility] Telephonic Care Management Coordinator Direct Phone: [phone] Toll Free: [phone] Fax: [phone]

## 2019-02-18 NOTE — Progress Notes (Signed)
Subjective:  Primary Physician:  Jani Gravel, MD  Patient ID: [name], female    DOB: 1931-09-23, 85 y.o.   MRN: [id number]  Chief Complaint  Patient presents with  . Congestive Heart Failure  . Follow-up    hospital fu    HPI: [name]  is a 85 y.o. female with a Past Medical History of chronic atrial fibrillation on anticoagulation, CHF, s/p PM, essential Hypertension and HOCM; who was recently hospitalized for acute congestive heart failure in the setting of noncompliance with medication regimen, Had discontinued taking furosemide as it caused her to have excessive diuresis.  She was admitted on 01/28/2019 and discharged 2 days later.  She was also noted to have left shin ulceration due to trauma.  PMH significant for  complete heart block S/P permanent pacemaker implantation on 11/27/2013, permanent atrial fibrillation on chronic anticoagulation with Eliquis,  hypertrophic cardiomyopathy status post septal ablation at [facility] sometime in 6384, Chronic systolic and diastolic heart failure due to burnt our HOCM, moderate pulmonary hypertension, severe COPD due to bronchial asthma, stage III chronic kidney disease and anemia of chronic disease.   Since hospital discharge, she has noticed improvement in dyspnea and also left leg edema.  However states that still has mild dyspnea and is not totally back to her baseline.  Denies chest pain, palpitations.  She was discharged on Lasix b.i.d. but has been taking once a day and has noticed improvement in leg edema.  Past Medical History:  Diagnosis Date  . Acute on chronic diastolic CHF (congestive heart failure) ([place]) 05/18/2015  . Anemia   . Arthritis   . Arthritis pain   . Asthma   . Blood transfusion   . Bronchitis   . Cancer ([place])    lt breast  . CHF (congestive heart failure) ([place])   . Chronic atrial fibrillation   . Constipation   . Current use of long term anticoagulation   . Cystitis   .  Heart block    Complete heart block post surgical requiring pacer  . Hemorrhoids   . Humerus shaft fracture    nonsurgical, June 2017  . Hypertension    Benign Essential Hypertension    . Hypertension   . Hypertr obst cardiomyop    Required septal myotomy  . Hypokalemia    Postoperative, improved  . Hyponatremia    Mild postoperative, improved  . Memory difficulty   . Mitral regurgitation   . Pacemaker   . Urinary incontinence     Past Surgical History:  Procedure Laterality Date  . ABDOMINAL HYSTERECTOMY    . ABOVE KNEE LEG AMPUTATION Right   . Anterior cervical diskectomy and fusion     C-7  . BELOW KNEE LEG AMPUTATION Right   . BREAST BIOPSY     Left, negative  . BREAST LUMPECTOMY  2012   left breast  . cardiomyopathy    . COLONOSCOPY    . FEMUR FRACTURE SURGERY     Right  . FRACTURE SURGERY    . JOINT REPLACEMENT    . ORIF ELBOW FRACTURE Right 04/08/2018   Procedure: OPEN REDUCTION INTERNAL FIXATION (ORIF) ELBOW/OLECRANON FRACTURE;  Surgeon: Renette Butters, MD;  Location: [place];  Service: Orthopedics;  Laterality: Right;  . PACEMAKER PLACEMENT  2006/11-27-2013   MDT dual chamber pacemaker implanted 2006 with gen change by Dr Lovena Le 11-27-2013  . PERMANENT PACEMAKER GENERATOR CHANGE N/A 11/27/2013   Procedure: PERMANENT PACEMAKER GENERATOR CHANGE;  Surgeon: Evans Lance, MD;  Location: [facility] CATH LAB;  Service: Cardiovascular;  Laterality: N/A;  . REPLACEMENT TOTAL KNEE BILATERAL    . Septal myotomy    . SHOULDER SURGERY     Right  . WRIST FRACTURE SURGERY     Right    Social History   Socioeconomic History  . Marital status: Married    Spouse name: Not on file  . Number of children: 2  . Years of education: Not on file  . Highest education level: Not on file  Occupational History  . Not on file  Social Needs  . Financial resource strain: Not on file  . Food insecurity:    Worry: Not on file    Inability: Not on file  . Transportation needs:     Medical: Not on file    Non-medical: Not on file  Tobacco Use  . Smoking status: Never Smoker  . Smokeless tobacco: Never Used  Substance and Sexual Activity  . Alcohol use: Not Currently  . Drug use: Never  . Sexual activity: Not Currently  Lifestyle  . Physical activity:    Days per week: Not on file    Minutes per session: Not on file  . Stress: Not on file  Relationships  . Social connections:    Talks on phone: Not on file    Gets together: Not on file    Attends religious service: Not on file    Active member of club or organization: Not on file    Attends meetings of clubs or organizations: Not on file    Relationship status: Not on file  . Intimate partner violence:    Fear of current or ex partner: Not on file    Emotionally abused: Not on file    Physically abused: Not on file    Forced sexual activity: Not on file  Other Topics Concern  . Not on file  Social History Narrative   ** Merged History Encounter **       Married   Two children 1 passed   Husband will be assisting with care after surgery    Current Outpatient Medications on File Prior to Visit  Medication Sig Dispense Refill  . acetaminophen (TYLENOL) 500 MG tablet Take 1,000 mg by mouth every 6 (six) hours as needed for headache (pain).    Marland Kitchen albuterol (PROAIR HFA) 108 (90 BASE) MCG/ACT inhaler Inhale 2 puffs into the lungs every 6 (six) hours as needed for wheezing. 1 Inhaler 3  . albuterol (PROVENTIL) (2.5 MG/3ML) 0.083% nebulizer solution Take 3 mLs (2.5 mg total) by nebulization every 6 (six) hours as needed for wheezing or shortness of breath. 75 mL 12  . alendronate (FOSAMAX) 70 MG tablet Take 70 mg by mouth every Thursday.    Marland Kitchen amiodarone (PACERONE) 200 MG tablet Take 200 mg by mouth daily.    Marland Kitchen apixaban (ELIQUIS) 2.5 MG TABS tablet Take 1 tablet (2.5 mg total) by mouth 2 (two) times daily. (Patient taking differently: Take 2.5 mg by mouth every morning. ) 60 tablet 1  . calcium citrate-vitamin  D (CITRACAL+D) 315-200 MG-UNIT per tablet Take 1 tablet by mouth 2 (two) times daily. 60 tablet 1  . cyclobenzaprine (FLEXERIL) 5 MG tablet Take 1 tablet (5 mg total) by mouth daily as needed for muscle spasms. 30 tablet 0  . cycloSPORINE (RESTASIS) 0.05 % ophthalmic emulsion Place 1 drop into both eyes 2 (two) times daily. 0.4 mL 1  . fluticasone (FLONASE)  50 MCG/ACT nasal spray Place 2 sprays into both nostrils 2 (two) times daily as needed for allergies.     . furosemide (LASIX) 20 MG tablet Take 1 tablet (20 mg total) by mouth 2 (two) times daily. (Patient taking differently: Take 20 mg by mouth daily. ) 60 tablet 1  . gabapentin (NEURONTIN) 100 MG capsule Take 200 mg by mouth 2 (two) times daily.    . hydrALAZINE (APRESOLINE) 25 MG tablet Take 25 mg by mouth daily.     Marland Kitchen levothyroxine (SYNTHROID, LEVOTHROID) 25 MCG tablet Take 25 mcg by mouth at bedtime.     . metoprolol succinate (TOPROL-XL) 100 MG 24 hr tablet Take 100 mg by mouth daily.    . Multiple Vitamin (MULTIVITAMIN WITH MINERALS) TABS tablet Take 1 tablet by mouth daily.    . potassium chloride SA (K-DUR,KLOR-CON) 20 MEQ tablet Take 1 tablet (20 mEq total) by mouth 2 (two) times daily. (Patient taking differently: Take 20 mEq by mouth daily. ) 60 tablet 1  . senna (SENOKOT) 8.6 MG TABS tablet Take 1 tablet (8.6 mg total) by mouth daily as needed for mild constipation.    . vitamin B-12 (CYANOCOBALAMIN) 1000 MCG tablet Take 1,000 mcg by mouth daily.     Marland Kitchen BEVESPI AEROSPHERE 9-4.8 MCG/ACT AERO Take 1 puff by mouth daily as needed for shortness of breath or wheezing.    Marland Kitchen guaiFENesin-dextromethorphan (ROBITUSSIN DM) 100-10 MG/5ML syrup Take 10 mLs by mouth every 4 (four) hours as needed for cough. (Patient not taking: Reported on 02/18/2019) 118 mL 0  . umeclidinium bromide (INCRUSE ELLIPTA) 62.5 MCG/INH AEPB Inhale 1 puff into the lungs 2 (two) times daily as needed (wheezing). (Patient not taking: Reported on 02/04/2019)     No current  facility-administered medications on file prior to visit.    Review of Systems  Constitutional: Negative for malaise/fatigue and weight loss.  Respiratory: Positive for shortness of breath. Negative for cough and hemoptysis.   Cardiovascular: Positive for leg swelling. Negative for chest pain, palpitations and claudication.  Gastrointestinal: Negative for abdominal pain, blood in stool, constipation, heartburn and vomiting.  Genitourinary: Negative for dysuria.  Musculoskeletal: Negative for joint pain and myalgias.       Right below knee AKA and walks with prosthesis  Neurological: Negative for dizziness, focal weakness and headaches.  Endo/Heme/Allergies: Does not bruise/bleed easily.  Psychiatric/Behavioral: Negative for depression. The patient is not nervous/anxious.   All other systems reviewed and are negative.     Objective:  Blood pressure 114/65, pulse 79, height 5' (1.524 m), weight 115 lb 3.2 oz (52.3 kg), SpO2 94 %. Body mass index is 22.5 kg/m.   Physical Exam  Constitutional: She appears well-developed and well-nourished. No distress.  HENT:  Head: Atraumatic.  Eyes: Conjunctivae are normal.  Neck: Neck supple. No JVD present. No thyromegaly present.  Cardiovascular: Normal rate, regular rhythm and intact distal pulses. Exam reveals no gallop.  Murmur heard. High-pitched blowing holosystolic murmur is present with a grade of 2/6 at the apex radiating to the apex. Pulses:      Carotid pulses are 2+ on the right side and 2+ on the left side.      Radial pulses are 2+ on the right side and 2+ on the left side.       Femoral pulses are 2+ on the right side and 2+ on the left side.      Popliteal pulses are 0 on the left side. Right popliteal pulse not accessible.  Dorsalis pedis pulses are 1+ on the left side. Right dorsalis pedis pulse not accessible.       Posterior tibial pulses are 0 on the left side. Right posterior tibial pulse not accessible.    Pulmonary/Chest: Effort normal and breath sounds normal.  Abdominal: Soft. Bowel sounds are normal.  Musculoskeletal: Normal range of motion.        General: No edema.  Neurological: She is alert.  Skin: Skin is warm and dry.  Psychiatric: She has a normal mood and affect.     CARDIAC STUDIES:   Echocardiogram 01/29/2019:  1. The left ventricle has normal systolic function of 57-84%. The cavity size was normal. Consider apical variant hypertrophic cardiomyopathy.. Left ventricular diastolic Doppler parameters are consistent with pseudonormal filling (grade 2 diastolic dysfunction). Elevated mean left atrial pressure There is abnormal septal motion consistent with RV pacemaker.  2. The right ventricle has normal systolic function. The cavity size was normal. There is no increase in right ventricular wall thickness. Right ventricular systolic pressure is mildly elevated with an estimated pressure of 45.4 mmHg.  3. Left atrial size was severely dilated.  4. Right atrial size was moderately dilated.  5. The mitral valve is degenerative. There is mild thickening and mild calcification. There is severe mitral annular calcification present. Mitral valve regurgitation is mild to moderate by color flow Doppler. The MR jet is centrally-directed.  6. The tricuspid valve is normal in structure. Tricuspid valve regurgitation is moderate.  7. The aortic valve is tricuspid There is mild thickening and moderate calcification of the aortic valve.  Assessment & Recommendations:  1. Acute on chronic diastolic CHF (congestive heart failure) ([place])  2. HOCM (hypertrophic obstructive cardiomyopathy) ([place])  3. Chronic atrial fibrillation EKG 02/18/19: Underlying A. Fib. Electronic ventricular pacemaker  Pacemaker ECG, No further analysis   4. Essential hypertension with chronic stage 3 CKD.  5. Cardiac pacemaker in situ Medtronic Adapta dual-chamber pacemaker implantation, programmed to VVI due to permanent  atrial fibrillation implanted 2006 with gen change by Dr Lovena Le 11/27/2013.  In person Pacemaker check 11/23/2018:  Normal pacer function. Battery life >10 years. Chronic atrial fibrillation. Complete heart block. Pacer dependant with escape oat 40 bpm. VHR, max 11 seconds on 11/20/18.  Brief VT episodes of 4 to 6 second episodes noted.   Remote pacemaker check 07/25/2018: There were 4 high ventricular rate episodes detected, consistent with nonsustained ventricular tachycardia. The longest episode lasted 6 seconds in duration with a CL range~ 445 ms to 351 ms. Patient has h/o NSVT and no CAD, HOCM adn normal LVEF. Battery longevity is 9.5 years. RV pacing is 97.9 %. Health trends are stable. Normal function.  Episodic transmission Remote Pacemaker transmission 06/09/2017: 6 seconds of NSVT?  6. History of heart block  7. NSVT (nonsustained ventricular tachycardia) (HCC) noted on pacemaker transmission, on Amiodarone since 06/09/17.   Recommendation:   She was seen in the office for recent hospitalization in February 2020 when she presented with acute decompensated heart failure after she stopped lasix, traumatic leg swelling in the left shin with wound, was diuresed and discharged home. EF now normalized compared to Echo in 2018 (40% to 55%). Symptoms of dyspnea has improved.  Persistent dyspnea is probably related to ongoing acute on chronic diastolic heart failure which is improved, leg edema has completely resolved, there is no JVD.  Hence I did not make any changes to her diuretic dose which she has reduced it to once a day of furosemide instead of  b.i.d.  Again discussed regarding restriction of salt intake.  I'll discontinue hydralazine and switch to lisinopril 10 mg daily.  I'll obtain a BMP in 10 days.  I'll like to see her back in 4 weeks for follow-up.  She is advised to contact me if she has any worsening dyspnea or leg edema.  Fortunately the left traumatic shin ulceration has  essentially healed.  She does have absent pulses in the lower extremity.  02/18/2019, 2:54 PM [facility]. [place] Pager: [phone] Office: [phone] If no answer Cell [phone]

## 2019-02-19 ENCOUNTER — Ambulatory Visit: Payer: Medicare Other

## 2019-02-20 DIAGNOSIS — I5033 Acute on chronic diastolic (congestive) heart failure: Secondary | ICD-10-CM | POA: Diagnosis not present

## 2019-02-20 DIAGNOSIS — I130 Hypertensive heart and chronic kidney disease with heart failure and stage 1 through stage 4 chronic kidney disease, or unspecified chronic kidney disease: Secondary | ICD-10-CM | POA: Diagnosis not present

## 2019-02-20 DIAGNOSIS — N182 Chronic kidney disease, stage 2 (mild): Secondary | ICD-10-CM | POA: Diagnosis not present

## 2019-02-20 DIAGNOSIS — Z89611 Acquired absence of right leg above knee: Secondary | ICD-10-CM | POA: Diagnosis not present

## 2019-02-20 DIAGNOSIS — Z89612 Acquired absence of left leg above knee: Secondary | ICD-10-CM | POA: Diagnosis not present

## 2019-02-20 DIAGNOSIS — I482 Chronic atrial fibrillation, unspecified: Secondary | ICD-10-CM | POA: Diagnosis not present

## 2019-02-21 ENCOUNTER — Other Ambulatory Visit: Payer: Self-pay

## 2019-02-21 NOTE — Patient Outreach (Signed)
[facility]) Care Management  02/21/2019  [name] Jul 30, 1931 [id number]    EMMI-HF RED ON EMMI ALERT Day # 20 Date: 02/20/2019 Red Alert Reason: " Sad,hopeless or empty? Yes  Lost interest in things they used to enjoy? Yes"   Outreach attempt #1 to patient. Spoke with patient who voices she is doing well. She denies any acute issues or concerns at this time. Reviewed and addressed red alerts with patient. She voices that machine id not hear her correctly and denies any of those feelings/symptoms. No further RN CM needs or concerns identified this call.     Plan: Primary assigned RN CM will follow up with patient within a week.  Enzo Montgomery, RN,BSN,CCM [place] Management Telephonic Care Management Coordinator Direct Phone: [phone] Toll Free: [phone] Fax: [phone]

## 2019-02-22 ENCOUNTER — Ambulatory Visit: Payer: Self-pay

## 2019-02-25 ENCOUNTER — Other Ambulatory Visit: Payer: Self-pay

## 2019-02-25 DIAGNOSIS — I130 Hypertensive heart and chronic kidney disease with heart failure and stage 1 through stage 4 chronic kidney disease, or unspecified chronic kidney disease: Secondary | ICD-10-CM | POA: Diagnosis not present

## 2019-02-25 DIAGNOSIS — N182 Chronic kidney disease, stage 2 (mild): Secondary | ICD-10-CM | POA: Diagnosis not present

## 2019-02-25 DIAGNOSIS — Z89612 Acquired absence of left leg above knee: Secondary | ICD-10-CM | POA: Diagnosis not present

## 2019-02-25 DIAGNOSIS — Z89611 Acquired absence of right leg above knee: Secondary | ICD-10-CM | POA: Diagnosis not present

## 2019-02-25 DIAGNOSIS — I482 Chronic atrial fibrillation, unspecified: Secondary | ICD-10-CM | POA: Diagnosis not present

## 2019-02-25 DIAGNOSIS — I5033 Acute on chronic diastolic (congestive) heart failure: Secondary | ICD-10-CM | POA: Diagnosis not present

## 2019-02-25 NOTE — Patient Outreach (Signed)
[facility]) Care Management  02/25/2019  [name] [birth year] [id number]    EMMI-HF RED ON EMMI ALERT  Day # 22 Date: 02/22/2019 Red Alert Reason: " Questions about meds? Yes"  Day # 24 Date: 02/24/2019 Red Alert Reason: "Weighed themselves? No  Any new probelms? Yes  New/worsening problems? Yes  New swelling? Yes  Had diarrhea or felt sick to stomach? Yes  Fever or chills?Yes "  Outreach attempt # 1 to patient. Spoke with patient who voices she is doing well. She denies any acute issues or concerns at this time. Reviewed and addressed red alerts with patient. She denies any of those issues and voiced that machine did not hear her correctly. No further RN CM needs or concerns at this time.   Plan: RN CM will close case as no further RN CM needs or concerns at this time.  Enzo Montgomery, RN,BSN,CCM [place] Management Telephonic Care Management Coordinator Direct Phone: [phone] Toll Free: [phone] Fax: [phone]

## 2019-02-26 ENCOUNTER — Ambulatory Visit: Payer: Self-pay

## 2019-02-26 ENCOUNTER — Other Ambulatory Visit: Payer: Self-pay | Admitting: *Deleted

## 2019-02-26 NOTE — Patient Outreach (Signed)
[facility]) Care Management  02/26/2019  TONGA PROUT 09-12-1931 [id number]   EMMI-Heart failure RED ON EMMI ALERT Day # 25 Date: Monday 02/25/19 10 05  Red Alert Reason: Weighed themselves today?No   Insurance: medicare [facility] admissions x 1  ED visits x 1 in the last 6 months    Outreach attempt # 1 successful at the home number  Patient is able to verify HIPAA [place] Management RN reviewed and addressed red alert with patient  EMMI Mrs [place] informs CM she forgets to weigh herself at intervals She relates this to getting "old" She states other than that she states "I'm fine" [facility] RN CM discussed with her the importance of weigh to assist with managing her CHF and to identify if she is having increased fluids that may cause increase CHF s/s Discussed CHF actin plan and s/s to report to Dr Maudie Mercury She voiced understanding  She weighed during this call and reports a wt of 110 lbs Her husband had to assist her in reading the scales  A 02/18/19 wt noted in Epic indicates she was 115 lbs - weight loss of 5 lbs    Social: lives with husband [name] She is independent with her care but needs assist with iADLS and transportation from her husband   Conditions: atrial fibrillation, HTN, CHF, bronchitis, mitral regurgitation, hx of right hip fx, cystitis, CKD, Hx of right BKA, asthma, urinary incontinence, pacemaker, left breast cancer, right carotid bruit hx of heart block   Medications: denies concerns with taking medications as prescribed, affording medications, side effects of medications and questions about medications    Appointments: 03/21/19 to see Cardiologist  Advance Directives:Denies need for assist with advance directives  Denies need for assist with changes to present advance directives    Consent: [facility] RN CM reviewed [facility] services with patient. Patient gave verbal consent for services.   Advised patient that there will be further automated EMMI- post  discharge calls to assess how the patient is doing following the recent hospitalization Advised the patient that another call may be received from a nurse if any of their responses were abnormal. Patient voiced understanding and was appreciative of f/u call.   Plan: [facility] RN CM will close case at this time as patient has been assessed and no needs identified/needs resolved.   Pt encouraged to return a call to [facility] RN CM prn  Betzalel Umbarger L. Lavina Hamman, RN, BSN, [place] Coordinator Office number [phone] Mobile number [phone]  Main [facility] number [phone] Fax number [phone]

## 2019-02-26 NOTE — Patient Outreach (Signed)
Patient triggered Red on EMMI Heart Failure Dashboard, notification sent to Kimberly Gibbs, RN 

## 2019-02-27 DIAGNOSIS — I482 Chronic atrial fibrillation, unspecified: Secondary | ICD-10-CM | POA: Diagnosis not present

## 2019-02-27 DIAGNOSIS — N182 Chronic kidney disease, stage 2 (mild): Secondary | ICD-10-CM | POA: Diagnosis not present

## 2019-02-27 DIAGNOSIS — I130 Hypertensive heart and chronic kidney disease with heart failure and stage 1 through stage 4 chronic kidney disease, or unspecified chronic kidney disease: Secondary | ICD-10-CM | POA: Diagnosis not present

## 2019-02-27 DIAGNOSIS — I5033 Acute on chronic diastolic (congestive) heart failure: Secondary | ICD-10-CM | POA: Diagnosis not present

## 2019-02-27 DIAGNOSIS — Z89612 Acquired absence of left leg above knee: Secondary | ICD-10-CM | POA: Diagnosis not present

## 2019-02-27 DIAGNOSIS — Z89611 Acquired absence of right leg above knee: Secondary | ICD-10-CM | POA: Diagnosis not present

## 2019-02-28 ENCOUNTER — Other Ambulatory Visit: Payer: Self-pay | Admitting: *Deleted

## 2019-02-28 NOTE — Patient Outreach (Signed)
[facility]) Care Management  02/28/2019  [name] [birth year] [id number]   EMMI- Heart failure RED ON EMMI ALERT Day # 26 Date: 02/26/19 1006 tuesday Red Alert Reason:New/worsening problems?Yes  Insurance: medicare [facility] admissions  X 1 ED visits x 1  in the last 6 months    Outreach attempt # 1 successful to the home number  Patient is able to verify HIPAA [place] Management RN reviewed and addressed red alert with patient  [name] reports the EMMI documented answer is incorrect She denies any worsening problems or new problems She reports the EMMI automated system was having issues understanding her responses  She reports " I feel really fine. I don't h ave any problems or pain anywhere"  Social: lives with husband [name] She is independent with her care but needs assist with iADLS and transportation from her husband   Conditions: atrial fibrillation, HTN, CHF, bronchitis, mitral regurgitation, hx of right hip fx, cystitis, CKD, Hx of right BKA, asthma, urinary incontinence, pacemaker, left breast cancer, right carotid bruit hx of heart block   Medications: denies concerns with taking medications as prescribed, affording medications, side effects of medications and questions about medications    Appointments: 03/21/19 to see Cardiologist  Advance Directives:Denies need for assist with advance directives  Denies need for assist with changes to present advance directives    Consent: [facility] RN CM reviewed [facility] services with patient. Patient gave verbal consent for services.   Advised patient that there will be further automated EMMI-post discharge calls to assess how the patient is doing following the recent hospitalization Advised the patient that another call may be received from a nurse if any of their responses were abnormal. Patient voiced understanding and was appreciative of f/u call.   Plan: [facility] RN CM will close case at this time  as patient has been assessed and no needs identified/needs resolved.   [name] L. Sharon Hamman, RN, BSN, [place] Coordinator Office number [phone] Mobile number [phone]  Main [facility] number [phone] Fax number [phone]

## 2019-03-01 ENCOUNTER — Other Ambulatory Visit: Payer: Self-pay | Admitting: *Deleted

## 2019-03-01 NOTE — Patient Outreach (Signed)
[facility]) Care Management  03/01/2019  [name] 1931-06-12 [id number]   EMMI- Heart failure RED ON EMMI ALERT Day # 28 Date: Thursday 02/28/19 1003  Red Alert Reason: Any new problems?Yes New/worsening problems?Yes New swelling?No Had diarrhea or felt sick to stomach?Yes Lightheaded or dizzy?Yes  Insurance: medicare [facility] admissions  X 1 ED visits x 1  in the last 6 months   [facility] RN CM spoke with [name] on 02/28/19 after 1630 and she denies any medical concerns She reports feeling well.  She voiced not having any type of pain or symptoms She did voice concern with repeated questions and audible issues related to EMMI automated system She was offered an apology and encouraged to answer all EMMI questions in a quiet area without background noises as clear and concise as much as possible. She voice understanding Refer to O[place] CM 02/28/19 note   Plan: [facility] RN CM will close case at this time as patient has been assessed and no needs identified/needs resolved.    Kimberly L. Lavina Hamman, RN, BSN, [place] Coordinator Office number [phone] Mobile number [phone]  Main [facility] number [phone] Fax number [phone]

## 2019-03-04 ENCOUNTER — Other Ambulatory Visit: Payer: Self-pay

## 2019-03-04 ENCOUNTER — Other Ambulatory Visit: Payer: Self-pay | Admitting: Cardiology

## 2019-03-04 ENCOUNTER — Other Ambulatory Visit: Payer: Self-pay | Admitting: *Deleted

## 2019-03-04 DIAGNOSIS — I10 Essential (primary) hypertension: Secondary | ICD-10-CM | POA: Diagnosis not present

## 2019-03-04 MED ORDER — AMIODARONE HCL 200 MG PO TABS: 200.0000 mg | ORAL_TABLET | Freq: Every day | ORAL | 2 refills | Status: DC

## 2019-03-04 NOTE — Patient Outreach (Signed)
  [facility]) Care Management  03/04/2019  [name] [birth year] [id number]   EMMI- Heart failure RED ON EMMI ALERT Day # 29, 30. 31  Date:  Red Alert Reason: Day #29 Questions about meds? Yes  Day #30 New/worsening problems?Yes  New swelling?Yes  New/worsening shortness of breath? Yes  Day #31 Any new problems? Yes  New/worsening problems? Yes  Had diarrhea or felt sick to stomach?Yes Nausea or vomiting?Yes Tired/fatigued?Yes Lightheaded or dizzy?Yes Other symptoms/problems?Yes     Outreach attempt # 1 successful  Patient is able to verify HIPAA [facility] Care Management RN reviewed and addressed red alert with patient  EMMI She denies all the documented EMMI "yes" answers She stated, "I am tired of talking with them on the phone. She does not understand when I say No" She denies questions about meds, new/sorsening problem, diarrhea, N/V, dizziness or other symptoms She asked when the calls would stop CM informed her generally at 70 or 48 calls She received the 31st call  She asked CM if she could ask for the calls to stop She reports I am doing "fine"  Consent: [facility] RN CM reviewed [facility] services with patient. Patient gave verbal consent for services.     Plan: [facility] RN CM will send a message to Thor about [name]'s request to stop EMMI calls [facility] RN CM will close case at this time as patient has been assessed and no needs identified/needs resolved.   Pt encouraged to return a call to [facility] RN CM prn  Kimberly L. Lavina Hamman, RN, BSN, [place] Coordinator Office number [phone] Mobile number [phone]  Main [facility] number [phone] Fax number [phone]

## 2019-03-05 ENCOUNTER — Other Ambulatory Visit: Payer: Self-pay

## 2019-03-05 DIAGNOSIS — I5033 Acute on chronic diastolic (congestive) heart failure: Secondary | ICD-10-CM

## 2019-03-05 LAB — BASIC METABOLIC PANEL
BUN/Creatinine Ratio: 27 (ref 12–28)
BUN: 31 mg/dL — ABNORMAL HIGH (ref 8–27)
CO2: 26 mmol/L (ref 20–29)
Calcium: 9 mg/dL (ref 8.7–10.3)
Chloride: 100 mmol/L (ref 96–106)
Creatinine, Ser: 1.15 mg/dL — ABNORMAL HIGH (ref 0.57–1.00)
GFR calc Af Amer: 50 mL/min/{1.73_m2} — ABNORMAL LOW (ref >59)
GFR calc non Af Amer: 43 mL/min/{1.73_m2} — ABNORMAL LOW (ref >59)
Glucose: 77 mg/dL (ref 65–99)
Potassium: 4.6 mmol/L (ref 3.5–5.2)
Sodium: 143 mmol/L (ref 134–144)

## 2019-03-05 MED ORDER — LISINOPRIL 10 MG PO TABS: 10.0000 mg | ORAL_TABLET | Freq: Every day | ORAL | 3 refills | Status: DC

## 2019-03-21 ENCOUNTER — Ambulatory Visit: Payer: Medicare Other | Admitting: Cardiology

## 2019-04-04 ENCOUNTER — Other Ambulatory Visit: Payer: Self-pay | Admitting: Cardiology

## 2019-04-16 DIAGNOSIS — I130 Hypertensive heart and chronic kidney disease with heart failure and stage 1 through stage 4 chronic kidney disease, or unspecified chronic kidney disease: Secondary | ICD-10-CM | POA: Diagnosis not present

## 2019-04-16 DIAGNOSIS — Z89611 Acquired absence of right leg above knee: Secondary | ICD-10-CM | POA: Diagnosis not present

## 2019-04-16 DIAGNOSIS — I5033 Acute on chronic diastolic (congestive) heart failure: Secondary | ICD-10-CM | POA: Diagnosis not present

## 2019-04-16 DIAGNOSIS — I482 Chronic atrial fibrillation, unspecified: Secondary | ICD-10-CM | POA: Diagnosis not present

## 2019-04-16 DIAGNOSIS — N182 Chronic kidney disease, stage 2 (mild): Secondary | ICD-10-CM | POA: Diagnosis not present

## 2019-04-18 ENCOUNTER — Ambulatory Visit (INDEPENDENT_AMBULATORY_CARE_PROVIDER_SITE_OTHER): Payer: Medicare Other | Admitting: Cardiology

## 2019-04-18 ENCOUNTER — Other Ambulatory Visit: Payer: Self-pay

## 2019-04-18 ENCOUNTER — Encounter: Payer: Self-pay | Admitting: Cardiology

## 2019-04-18 ENCOUNTER — Other Ambulatory Visit: Payer: Self-pay | Admitting: Cardiology

## 2019-04-18 VITALS — Ht 60.0 in | Wt 123.0 lb

## 2019-04-18 DIAGNOSIS — R0989 Other specified symptoms and signs involving the circulatory and respiratory systems: Secondary | ICD-10-CM

## 2019-04-18 DIAGNOSIS — I10 Essential (primary) hypertension: Secondary | ICD-10-CM | POA: Diagnosis not present

## 2019-04-18 DIAGNOSIS — I5032 Chronic diastolic (congestive) heart failure: Secondary | ICD-10-CM

## 2019-04-18 DIAGNOSIS — Z950 Presence of cardiac pacemaker: Secondary | ICD-10-CM | POA: Diagnosis not present

## 2019-04-18 DIAGNOSIS — I4821 Permanent atrial fibrillation: Secondary | ICD-10-CM | POA: Diagnosis not present

## 2019-04-18 NOTE — Progress Notes (Signed)
Virtual Visit via Telephone Note: Patient unable to use video assisted device.  This visit type was conducted due to national recommendations for restrictions regarding the COVID-19 Pandemic (e.g. social distancing).  This format is felt to be most appropriate for this patient at this time.  All issues noted in this document were discussed and addressed.  No physical exam was performed.  The patient has consented to conduct a Telehealth visit and understands insurance will be billed.   I connected with@, on 04/20/19 at  by TELEPHONE and verified that I am speaking with the correct person using two identifiers.   I discussed the limitations of evaluation and management by telemedicine and the availability of in person appointments. The patient expressed understanding and agreed to proceed.   I have discussed with patient regarding the safety during COVID Pandemic and steps and precautions to be taken including social distancing, frequent hand wash and use of detergent soap, gels with the patient. I asked the patient to avoid touching mouth, nose, eyes, ears with the hands. I encouraged regular walking around the neighborhood and exercise and regular diet, as long as social distancing can be maintained.  Primary Physician/Referring:  Jani Gravel, MD  Patient ID: [name], female    DOB: 09-28-1931, 85 y.o.   MRN: [id number]  Chief Complaint  Patient presents with  . Congestive Heart Failure  . Hypertension  . Atrial Fibrillation  . Mitral Regurgitation    HPI: [name]  is a 85 y.o. female  with acute congestive heart failure in the setting of noncompliance with medication regimen, Had discontinued taking furosemide as it caused her to have excessive diuresis.  She was admitted on 01/28/2019 and discharged 2 days later.  She was also noted to have left shin ulceration due to trauma which has now healed.  PMH significant for  complete heart block S/P permanent pacemaker implantation  on 11/27/2013, permanent atrial fibrillation on chronic anticoagulation with Eliquis,  hypertrophic cardiomyopathy status post septal ablation at [facility] sometime in 0258, Chronic systolic and diastolic heart failure due to burnt out HOCM, EF improved to  normal on medical therapy by echo in 01/2019, moderate pulmonary hypertension, severe COPD due to bronchial asthma, stage III chronic kidney disease and anemia of chronic disease. On her last office visit due to elevated blood pressure and also for CHF, I had added lisinopril and obtained a BMP.  This is a virtual visit.  Since last office visit 4-6 weeks ago she continues to complain of mild vibratory sense and pain at the pacemaker site.  Otherwise leg edema has completely resolved, dyspnea has improved and is back to baseline.  No dizziness or syncope.  Past Medical History:  Diagnosis Date  . Acute on chronic diastolic CHF (congestive heart failure) ([place]) 05/18/2015  . Acute respiratory failure ([place]) 06/09/2016  . Anemia   . Arthritis   . Arthritis pain   . Asthma   . Blood transfusion   . Bronchitis   . Cancer ([place])    lt breast  . CHF (congestive heart failure) ([place])   . Constipation   . Current use of long term anticoagulation   . Cystitis   . Heart block    Complete heart block post surgical requiring pacer  . Humerus shaft fracture    nonsurgical, June 2017  . Hypertr obst cardiomyop    Required septal myotomy  . Hypokalemia    Postoperative, improved  . Hyponatremia  Mild postoperative, improved  . Memory difficulty   . Mitral regurgitation   . Pacemaker   . Urinary incontinence     Past Surgical History:  Procedure Laterality Date  . ABDOMINAL HYSTERECTOMY    . ABOVE KNEE LEG AMPUTATION Right   . Anterior cervical diskectomy and fusion     C-7  . BELOW KNEE LEG AMPUTATION Right   . BREAST BIOPSY     Left, negative  . BREAST LUMPECTOMY  2012   left breast  . cardiomyopathy    .  COLONOSCOPY    . FEMUR FRACTURE SURGERY     Right  . FRACTURE SURGERY    . JOINT REPLACEMENT    . ORIF ELBOW FRACTURE Right 04/08/2018   Procedure: OPEN REDUCTION INTERNAL FIXATION (ORIF) ELBOW/OLECRANON FRACTURE;  Surgeon: Renette Butters, MD;  Location: [place];  Service: Orthopedics;  Laterality: Right;  . PACEMAKER PLACEMENT  2006/11-27-2013   MDT dual chamber pacemaker implanted 2006 with gen change by Dr Lovena Le 11-27-2013  . PERMANENT PACEMAKER GENERATOR CHANGE N/A 11/27/2013   Procedure: PERMANENT PACEMAKER GENERATOR CHANGE;  Surgeon: Evans Lance, MD;  Location: [facility] CATH LAB;  Service: Cardiovascular;  Laterality: N/A;  . REPLACEMENT TOTAL KNEE BILATERAL    . Septal myotomy    . SHOULDER SURGERY     Right  . WRIST FRACTURE SURGERY     Right    Social History   Socioeconomic History  . Marital status: Married    Spouse name: Not on file  . Number of children: 2  . Years of education: Not on file  . Highest education level: Not on file  Occupational History  . Not on file  Social Needs  . Financial resource strain: Not on file  . Food insecurity:    Worry: Not on file    Inability: Not on file  . Transportation needs:    Medical: Not on file    Non-medical: Not on file  Tobacco Use  . Smoking status: Never Smoker  . Smokeless tobacco: Never Used  Substance and Sexual Activity  . Alcohol use: Not Currently  . Drug use: Never  . Sexual activity: Not Currently  Lifestyle  . Physical activity:    Days per week: Not on file    Minutes per session: Not on file  . Stress: Not on file  Relationships  . Social connections:    Talks on phone: Not on file    Gets together: Not on file    Attends religious service: Not on file    Active member of club or organization: Not on file    Attends meetings of clubs or organizations: Not on file    Relationship status: Not on file  . Intimate partner violence:    Fear of current or ex partner: Not on file    Emotionally  abused: Not on file    Physically abused: Not on file    Forced sexual activity: Not on file  Other Topics Concern  . Not on file  Social History Narrative   ** Merged History Encounter **       Married   Two children 1 passed   Husband will be assisting with care after surgery    Current Outpatient Medications on File Prior to Visit  Medication Sig Dispense Refill  . albuterol (PROAIR HFA) 108 (90 BASE) MCG/ACT inhaler Inhale 2 puffs into the lungs every 6 (six) hours as needed for wheezing. 1 Inhaler 3  . albuterol (  PROVENTIL) (2.5 MG/3ML) 0.083% nebulizer solution Take 3 mLs (2.5 mg total) by nebulization every 6 (six) hours as needed for wheezing or shortness of breath. 75 mL 12  . alendronate (FOSAMAX) 70 MG tablet Take 70 mg by mouth every Thursday.    Marland Kitchen amiodarone (PACERONE) 200 MG tablet TAKE 1 TABLET BY MOUTH DAILY 30 tablet 6  . apixaban (ELIQUIS) 2.5 MG TABS tablet Take 1 tablet (2.5 mg total) by mouth 2 (two) times daily. (Patient taking differently: Take 2.5 mg by mouth every morning. ) 60 tablet 1  . cyclobenzaprine (FLEXERIL) 5 MG tablet Take 1 tablet (5 mg total) by mouth daily as needed for muscle spasms. 30 tablet 0  . cycloSPORINE (RESTASIS) 0.05 % ophthalmic emulsion Place 1 drop into both eyes 2 (two) times daily. 0.4 mL 1  . fluticasone (FLONASE) 50 MCG/ACT nasal spray Place 2 sprays into both nostrils 2 (two) times daily as needed for allergies.     . furosemide (LASIX) 20 MG tablet Take 1 tablet (20 mg total) by mouth daily.    Marland Kitchen gabapentin (NEURONTIN) 100 MG capsule Take 200 mg by mouth 2 (two) times daily.    Marland Kitchen levothyroxine (SYNTHROID, LEVOTHROID) 25 MCG tablet Take 25 mcg by mouth at bedtime.     Marland Kitchen lisinopril (PRINIVIL,ZESTRIL) 10 MG tablet Take 1 tablet (10 mg total) by mouth daily. 90 tablet 3  . metoprolol succinate (TOPROL-XL) 100 MG 24 hr tablet Take 100 mg by mouth daily.    . Multiple Vitamin (MULTIVITAMIN WITH MINERALS) TABS tablet Take 1 tablet by  mouth daily.    . potassium chloride SA (K-DUR,KLOR-CON) 20 MEQ tablet Take 1 tablet (20 mEq total) by mouth daily.    Marland Kitchen senna (SENOKOT) 8.6 MG TABS tablet Take 1 tablet (8.6 mg total) by mouth daily as needed for mild constipation.    . vitamin B-12 (CYANOCOBALAMIN) 1000 MCG tablet Take 1,000 mcg by mouth daily.     Marland Kitchen acetaminophen (TYLENOL) 500 MG tablet Take 1,000 mg by mouth every 6 (six) hours as needed for headache (pain).    . BEVESPI AEROSPHERE 9-4.8 MCG/ACT AERO Take 1 puff by mouth daily as needed for shortness of breath or wheezing.    . calcium citrate-vitamin D (CITRACAL+D) 315-200 MG-UNIT per tablet Take 1 tablet by mouth 2 (two) times daily. (Patient not taking: Reported on 04/18/2019) 60 tablet 1  . guaiFENesin-dextromethorphan (ROBITUSSIN DM) 100-10 MG/5ML syrup Take 10 mLs by mouth every 4 (four) hours as needed for cough. (Patient not taking: Reported on 02/18/2019) 118 mL 0  . umeclidinium bromide (INCRUSE ELLIPTA) 62.5 MCG/INH AEPB Inhale 1 puff into the lungs 2 (two) times daily as needed (wheezing). (Patient not taking: Reported on 02/04/2019)     No current facility-administered medications on file prior to visit.     Review of Systems  Constitution: Negative for chills, decreased appetite, malaise/fatigue and weight gain.  Cardiovascular: Positive for dyspnea on exertion (improved and stable). Negative for leg swelling (Left BKA) and syncope.  Endocrine: Negative for cold intolerance.  Hematologic/Lymphatic: Does not bruise/bleed easily.  Musculoskeletal: Positive for arthritis and back pain. Negative for joint swelling.  Gastrointestinal: Negative for abdominal pain, anorexia and change in bowel habit.  Neurological: Negative for headaches and light-headedness.  Psychiatric/Behavioral: Negative for depression and substance abuse.  All other systems reviewed and are negative.     Objective  Height 5' (1.524 m), weight 123 lb (55.8 kg). Body mass index is 24.02 kg/m.  Physical exam not  performed or limited due to virtual visit via telephone. Please see exam details from prior visit is as below. No VS available.    Physical Exam  Constitutional: She appears well-developed and well-nourished. No distress.  HENT:  Head: Atraumatic.  Eyes: Conjunctivae are normal.  Neck: Neck supple. No JVD present. No thyromegaly present.  Cardiovascular: Normal rate, regular rhythm and intact distal pulses. Exam reveals no gallop.  Murmur heard. High-pitched blowing holosystolic murmur is present with a grade of 2/6 at the apex radiating to the apex. Pulses:      Carotid pulses are 2+ on the right side and 2+ on the left side.      Radial pulses are 2+ on the right side and 2+ on the left side.       Femoral pulses are 2+ on the right side and 2+ on the left side.      Popliteal pulses are 0 on the left side. Right popliteal pulse not accessible.       Dorsalis pedis pulses are 1+ on the left side. Right dorsalis pedis pulse not accessible.       Posterior tibial pulses are 0 on the left side. Right posterior tibial pulse not accessible.  Pulmonary/Chest: Effort normal and breath sounds normal.  Abdominal: Soft. Bowel sounds are normal.  Musculoskeletal: Normal range of motion.        General: No edema.  Neurological: She is alert.  Skin: Skin is warm and dry.  Psychiatric: She has a normal mood and affect.   Radiology: No results found.  Laboratory examination:    CMP Latest Ref Rng & Units 03/04/2019 01/29/2019 01/28/2019  Glucose 65 - 99 mg/dL 77 57(L) 85  BUN 8 - 27 mg/dL 31(H) 15 15  Creatinine 0.57 - 1.00 mg/dL 1.15(H) 1.26(H) 1.06(H)  Sodium 134 - 144 mmol/L 143 144 140  Potassium 3.5 - 5.2 mmol/L 4.6 3.6 4.4  Chloride 96 - 106 mmol/L 100 101 106  CO2 20 - 29 mmol/L 26 25 24   Calcium 8.7 - 10.3 mg/dL 9.0 9.2 9.0  Total Protein 6.5 - 8.1 g/dL - - -  Total Bilirubin 0.3 - 1.2 mg/dL - - -  Alkaline Phos 38 - 126 U/L - - -  AST 15 - 41 U/L - - -  ALT 0  - 44 U/L - - -   CBC Latest Ref Rng & Units 01/28/2019 01/03/2019 04/07/2018  WBC 4.0 - 10.5 K/uL 7.3 7.6 12.8(H)  Hemoglobin 12.0 - 15.0 g/dL 13.7 12.4 12.1  Hematocrit 36.0 - 46.0 % 43.5 40.8 37.3  Platelets 150 - 400 K/uL 199 187 166   Cardiac Studies:   Echocardiogram 01/29/2019:  1. The left ventricle has normal systolic function of 01-75%. The cavity size was normal. Consider apical variant hypertrophic cardiomyopathy.. Left ventricular diastolic Doppler parameters are consistent with pseudonormal filling (grade 2 diastolic dysfunction). Elevated mean left atrial pressure There is abnormal septal motion consistent with RV pacemaker.  2. The right ventricle has normal systolic function. The cavity size was normal. There is no increase in right ventricular wall thickness. Right ventricular systolic pressure is mildly elevated with an estimated pressure of 45.4 mmHg.  3. Left atrial size was severely dilated.  4. Right atrial size was moderately dilated.  5. The mitral valve is degenerative. There is mild thickening and mild calcification. There is severe mitral annular calcification present. Mitral valve regurgitation is mild to moderate by color flow Doppler. The MR jet is centrally-directed.  6.  The tricuspid valve is normal in structure. Tricuspid valve regurgitation is moderate.  7. The aortic valve is tricuspid There is mild thickening and moderate calcification of the aortic valve.  Assessment   Chronic diastolic congestive heart failure [facility])  Essential hypertension  Permanent atrial fibrillation  Right carotid bruit  [employer]tronic Adapta dual-chamber pacemaker 2006, generator change 11/27/2013  EKG 02/18/19: Underlying A. Fib. Electronic ventricular pacemaker Pacemaker ECG, No further analysis   Clinic check of pacemaker: 11/23/2018:  Normal pacer function. Battery life >10 years. Chronic atrial fibrillation. VVI mode. Complete heart block. Pacer dependant with escape at 40 bpm.   Recommendations:   She was seen in the office for recent hospitalization in February 2020 when she presented with acute decompensated heart failure after she stopped lasix was diuresed and discharged home. EF now normalized compared to Echo in 2018 (40% to 55%).   She is now tolerating lisinopril and hydralazine was discontinued.  Labs are remained stable.  She complains of discomfort in the pacemaker site, she has not had recent pacemaker transmission remotely, we had tried to contact her previously.  There appears to be some issue with her transmitter and we will look into it.  I'll see her back in the office in 6 months.  Adrian Prows, MD, [facility] 04/19/2019, 1:17 PM [facility] [place] Pager: [phone] Office: [phone] If no answer Cell [phone]

## 2019-04-20 ENCOUNTER — Encounter: Payer: Self-pay | Admitting: Cardiology

## 2019-04-30 DIAGNOSIS — E039 Hypothyroidism, unspecified: Secondary | ICD-10-CM | POA: Diagnosis not present

## 2019-04-30 DIAGNOSIS — N390 Urinary tract infection, site not specified: Secondary | ICD-10-CM | POA: Diagnosis not present

## 2019-04-30 DIAGNOSIS — I10 Essential (primary) hypertension: Secondary | ICD-10-CM | POA: Diagnosis not present

## 2019-05-07 DIAGNOSIS — I10 Essential (primary) hypertension: Secondary | ICD-10-CM | POA: Diagnosis not present

## 2019-05-07 DIAGNOSIS — Z950 Presence of cardiac pacemaker: Secondary | ICD-10-CM | POA: Diagnosis not present

## 2019-05-07 DIAGNOSIS — N182 Chronic kidney disease, stage 2 (mild): Secondary | ICD-10-CM | POA: Diagnosis not present

## 2019-05-07 DIAGNOSIS — J449 Chronic obstructive pulmonary disease, unspecified: Secondary | ICD-10-CM | POA: Diagnosis not present

## 2019-05-07 DIAGNOSIS — Z45018 Encounter for adjustment and management of other part of cardiac pacemaker: Secondary | ICD-10-CM | POA: Diagnosis not present

## 2019-05-07 DIAGNOSIS — N390 Urinary tract infection, site not specified: Secondary | ICD-10-CM | POA: Diagnosis not present

## 2019-05-07 DIAGNOSIS — N183 Chronic kidney disease, stage 3 (moderate): Secondary | ICD-10-CM | POA: Diagnosis not present

## 2019-05-07 DIAGNOSIS — I5042 Chronic combined systolic (congestive) and diastolic (congestive) heart failure: Secondary | ICD-10-CM | POA: Diagnosis not present

## 2019-06-05 ENCOUNTER — Ambulatory Visit: Payer: Medicare Other | Admitting: Cardiology

## 2019-06-07 DIAGNOSIS — Z4502 Encounter for adjustment and management of automatic implantable cardiac defibrillator: Secondary | ICD-10-CM | POA: Diagnosis not present

## 2019-06-07 DIAGNOSIS — I5042 Chronic combined systolic (congestive) and diastolic (congestive) heart failure: Secondary | ICD-10-CM | POA: Diagnosis not present

## 2019-06-07 DIAGNOSIS — Z95810 Presence of automatic (implantable) cardiac defibrillator: Secondary | ICD-10-CM | POA: Diagnosis not present

## 2019-06-13 ENCOUNTER — Encounter: Payer: Self-pay | Admitting: Cardiology

## 2019-06-13 DIAGNOSIS — Z45018 Encounter for adjustment and management of other part of cardiac pacemaker: Secondary | ICD-10-CM

## 2019-06-13 HISTORY — DX: Encounter for adjustment and management of other part of cardiac pacemaker: Z45.018

## 2019-06-20 DIAGNOSIS — Z0000 Encounter for general adult medical examination without abnormal findings: Secondary | ICD-10-CM | POA: Diagnosis not present

## 2019-06-20 DIAGNOSIS — E039 Hypothyroidism, unspecified: Secondary | ICD-10-CM | POA: Diagnosis not present

## 2019-06-20 DIAGNOSIS — E785 Hyperlipidemia, unspecified: Secondary | ICD-10-CM | POA: Diagnosis not present

## 2019-06-20 DIAGNOSIS — I10 Essential (primary) hypertension: Secondary | ICD-10-CM | POA: Diagnosis not present

## 2019-06-20 DIAGNOSIS — R739 Hyperglycemia, unspecified: Secondary | ICD-10-CM | POA: Diagnosis not present

## 2019-06-24 DIAGNOSIS — N390 Urinary tract infection, site not specified: Secondary | ICD-10-CM | POA: Diagnosis not present

## 2019-06-27 DIAGNOSIS — I10 Essential (primary) hypertension: Secondary | ICD-10-CM | POA: Diagnosis not present

## 2019-06-27 DIAGNOSIS — I340 Nonrheumatic mitral (valve) insufficiency: Secondary | ICD-10-CM | POA: Diagnosis not present

## 2019-06-27 DIAGNOSIS — I509 Heart failure, unspecified: Secondary | ICD-10-CM | POA: Diagnosis not present

## 2019-06-27 DIAGNOSIS — I4891 Unspecified atrial fibrillation: Secondary | ICD-10-CM | POA: Diagnosis not present

## 2019-06-27 DIAGNOSIS — I272 Pulmonary hypertension, unspecified: Secondary | ICD-10-CM | POA: Diagnosis not present

## 2019-06-27 DIAGNOSIS — Z89619 Acquired absence of unspecified leg above knee: Secondary | ICD-10-CM | POA: Diagnosis not present

## 2019-06-27 DIAGNOSIS — R42 Dizziness and giddiness: Secondary | ICD-10-CM | POA: Diagnosis not present

## 2019-06-27 DIAGNOSIS — E039 Hypothyroidism, unspecified: Secondary | ICD-10-CM | POA: Diagnosis not present

## 2019-06-27 DIAGNOSIS — D649 Anemia, unspecified: Secondary | ICD-10-CM | POA: Diagnosis not present

## 2019-06-27 DIAGNOSIS — J449 Chronic obstructive pulmonary disease, unspecified: Secondary | ICD-10-CM | POA: Diagnosis not present

## 2019-06-27 DIAGNOSIS — Z0000 Encounter for general adult medical examination without abnormal findings: Secondary | ICD-10-CM | POA: Diagnosis not present

## 2019-07-17 DIAGNOSIS — I5042 Chronic combined systolic (congestive) and diastolic (congestive) heart failure: Secondary | ICD-10-CM | POA: Diagnosis not present

## 2019-07-17 DIAGNOSIS — Z95810 Presence of automatic (implantable) cardiac defibrillator: Secondary | ICD-10-CM | POA: Diagnosis not present

## 2019-07-17 DIAGNOSIS — Z4502 Encounter for adjustment and management of automatic implantable cardiac defibrillator: Secondary | ICD-10-CM | POA: Diagnosis not present

## 2019-09-20 DIAGNOSIS — I5042 Chronic combined systolic (congestive) and diastolic (congestive) heart failure: Secondary | ICD-10-CM | POA: Diagnosis not present

## 2019-09-20 DIAGNOSIS — Z950 Presence of cardiac pacemaker: Secondary | ICD-10-CM | POA: Diagnosis not present

## 2019-09-20 DIAGNOSIS — Z45018 Encounter for adjustment and management of other part of cardiac pacemaker: Secondary | ICD-10-CM | POA: Diagnosis not present

## 2019-09-24 ENCOUNTER — Encounter: Payer: Self-pay | Admitting: Cardiology

## 2019-09-24 DIAGNOSIS — I459 Conduction disorder, unspecified: Secondary | ICD-10-CM | POA: Insufficient documentation

## 2019-10-02 ENCOUNTER — Inpatient Hospital Stay (HOSPITAL_COMMUNITY)
Admission: EM | Admit: 2019-10-02 | Discharge: 2019-10-08 | DRG: 884 | Disposition: A | Discharge status: Skilled Nursing Facility | Payer: Medicare Other | Attending: Family Medicine | Admitting: Family Medicine

## 2019-10-02 ENCOUNTER — Encounter (HOSPITAL_COMMUNITY): Payer: Self-pay | Admitting: Emergency Medicine

## 2019-10-02 ENCOUNTER — Emergency Department (HOSPITAL_COMMUNITY): Payer: Medicare Other

## 2019-10-02 DIAGNOSIS — Z853 Personal history of malignant neoplasm of breast: Secondary | ICD-10-CM

## 2019-10-02 DIAGNOSIS — Z8249 Family history of ischemic heart disease and other diseases of the circulatory system: Secondary | ICD-10-CM

## 2019-10-02 DIAGNOSIS — Z7901 Long term (current) use of anticoagulants: Secondary | ICD-10-CM

## 2019-10-02 DIAGNOSIS — G9340 Encephalopathy, unspecified: Secondary | ICD-10-CM | POA: Diagnosis present

## 2019-10-02 DIAGNOSIS — I5032 Chronic diastolic (congestive) heart failure: Secondary | ICD-10-CM | POA: Diagnosis present

## 2019-10-02 DIAGNOSIS — I10 Essential (primary) hypertension: Secondary | ICD-10-CM | POA: Diagnosis present

## 2019-10-02 DIAGNOSIS — R778 Other specified abnormalities of plasma proteins: Secondary | ICD-10-CM

## 2019-10-02 DIAGNOSIS — Z882 Allergy status to sulfonamides status: Secondary | ICD-10-CM

## 2019-10-02 DIAGNOSIS — Z89611 Acquired absence of right leg above knee: Secondary | ICD-10-CM

## 2019-10-02 DIAGNOSIS — F05 Delirium due to known physiological condition: Secondary | ICD-10-CM | POA: Diagnosis present

## 2019-10-02 DIAGNOSIS — W19XXXA Unspecified fall, initial encounter: Secondary | ICD-10-CM | POA: Diagnosis present

## 2019-10-02 DIAGNOSIS — Z66 Do not resuscitate: Secondary | ICD-10-CM | POA: Diagnosis present

## 2019-10-02 DIAGNOSIS — R0602 Shortness of breath: Secondary | ICD-10-CM | POA: Diagnosis not present

## 2019-10-02 DIAGNOSIS — F0390 Unspecified dementia without behavioral disturbance: Principal | ICD-10-CM | POA: Diagnosis present

## 2019-10-02 DIAGNOSIS — R404 Transient alteration of awareness: Secondary | ICD-10-CM | POA: Diagnosis not present

## 2019-10-02 DIAGNOSIS — I248 Other forms of acute ischemic heart disease: Secondary | ICD-10-CM | POA: Diagnosis present

## 2019-10-02 DIAGNOSIS — Z885 Allergy status to narcotic agent status: Secondary | ICD-10-CM

## 2019-10-02 DIAGNOSIS — Z20828 Contact with and (suspected) exposure to other viral communicable diseases: Secondary | ICD-10-CM | POA: Diagnosis present

## 2019-10-02 DIAGNOSIS — S299XXA Unspecified injury of thorax, initial encounter: Secondary | ICD-10-CM | POA: Diagnosis not present

## 2019-10-02 DIAGNOSIS — I480 Paroxysmal atrial fibrillation: Secondary | ICD-10-CM | POA: Diagnosis present

## 2019-10-02 DIAGNOSIS — R410 Disorientation, unspecified: Secondary | ICD-10-CM

## 2019-10-02 DIAGNOSIS — R262 Difficulty in walking, not elsewhere classified: Secondary | ICD-10-CM | POA: Diagnosis present

## 2019-10-02 DIAGNOSIS — N1831 Chronic kidney disease, stage 3a: Secondary | ICD-10-CM | POA: Diagnosis present

## 2019-10-02 DIAGNOSIS — R52 Pain, unspecified: Secondary | ICD-10-CM | POA: Diagnosis not present

## 2019-10-02 DIAGNOSIS — S0990XA Unspecified injury of head, initial encounter: Secondary | ICD-10-CM | POA: Diagnosis not present

## 2019-10-02 DIAGNOSIS — R7989 Other specified abnormal findings of blood chemistry: Secondary | ICD-10-CM | POA: Diagnosis present

## 2019-10-02 DIAGNOSIS — R4182 Altered mental status, unspecified: Secondary | ICD-10-CM | POA: Diagnosis present

## 2019-10-02 DIAGNOSIS — Z951 Presence of aortocoronary bypass graft: Secondary | ICD-10-CM

## 2019-10-02 DIAGNOSIS — I421 Obstructive hypertrophic cardiomyopathy: Secondary | ICD-10-CM | POA: Diagnosis present

## 2019-10-02 DIAGNOSIS — I340 Nonrheumatic mitral (valve) insufficiency: Secondary | ICD-10-CM | POA: Diagnosis present

## 2019-10-02 DIAGNOSIS — Z23 Encounter for immunization: Secondary | ICD-10-CM

## 2019-10-02 DIAGNOSIS — I130 Hypertensive heart and chronic kidney disease with heart failure and stage 1 through stage 4 chronic kidney disease, or unspecified chronic kidney disease: Secondary | ICD-10-CM | POA: Diagnosis present

## 2019-10-02 DIAGNOSIS — N189 Chronic kidney disease, unspecified: Secondary | ICD-10-CM | POA: Diagnosis present

## 2019-10-02 DIAGNOSIS — S81812A Laceration without foreign body, left lower leg, initial encounter: Secondary | ICD-10-CM | POA: Diagnosis present

## 2019-10-02 DIAGNOSIS — Z7951 Long term (current) use of inhaled steroids: Secondary | ICD-10-CM

## 2019-10-02 DIAGNOSIS — Z96653 Presence of artificial knee joint, bilateral: Secondary | ICD-10-CM | POA: Diagnosis present

## 2019-10-02 DIAGNOSIS — E039 Hypothyroidism, unspecified: Secondary | ICD-10-CM | POA: Diagnosis present

## 2019-10-02 DIAGNOSIS — Z89512 Acquired absence of left leg below knee: Secondary | ICD-10-CM

## 2019-10-02 DIAGNOSIS — I4891 Unspecified atrial fibrillation: Secondary | ICD-10-CM | POA: Diagnosis present

## 2019-10-02 DIAGNOSIS — R0902 Hypoxemia: Secondary | ICD-10-CM | POA: Diagnosis not present

## 2019-10-02 DIAGNOSIS — J449 Chronic obstructive pulmonary disease, unspecified: Secondary | ICD-10-CM | POA: Diagnosis present

## 2019-10-02 DIAGNOSIS — R413 Other amnesia: Secondary | ICD-10-CM | POA: Diagnosis present

## 2019-10-02 DIAGNOSIS — S199XXA Unspecified injury of neck, initial encounter: Secondary | ICD-10-CM | POA: Diagnosis not present

## 2019-10-02 DIAGNOSIS — R519 Headache, unspecified: Secondary | ICD-10-CM | POA: Diagnosis present

## 2019-10-02 DIAGNOSIS — I509 Heart failure, unspecified: Secondary | ICD-10-CM

## 2019-10-02 DIAGNOSIS — Z89511 Acquired absence of right leg below knee: Secondary | ICD-10-CM

## 2019-10-02 DIAGNOSIS — Z90710 Acquired absence of both cervix and uterus: Secondary | ICD-10-CM

## 2019-10-02 DIAGNOSIS — Z79899 Other long term (current) drug therapy: Secondary | ICD-10-CM

## 2019-10-02 DIAGNOSIS — Z800 Family history of malignant neoplasm of digestive organs: Secondary | ICD-10-CM

## 2019-10-02 LAB — CK: Total CK: 188 U/L (ref 38–234)

## 2019-10-02 LAB — URINALYSIS, ROUTINE W REFLEX MICROSCOPIC
Bacteria, UA: NONE SEEN
Bilirubin Urine: NEGATIVE
Glucose, UA: NEGATIVE mg/dL
Hgb urine dipstick: NEGATIVE
Ketones, ur: NEGATIVE mg/dL
Nitrite: NEGATIVE
Protein, ur: 100 mg/dL — AB
Specific Gravity, Urine: 1.012 (ref 1.005–1.030)
pH: 7 (ref 5.0–8.0)

## 2019-10-02 LAB — COMPREHENSIVE METABOLIC PANEL
ALT: 20 U/L (ref 0–44)
AST: 38 U/L (ref 15–41)
Albumin: 4.4 g/dL (ref 3.5–5.0)
Alkaline Phosphatase: 53 U/L (ref 38–126)
Anion gap: 17 — ABNORMAL HIGH (ref 5–15)
BUN: 12 mg/dL (ref 8–23)
CO2: 23 mmol/L (ref 22–32)
Calcium: 9.7 mg/dL (ref 8.9–10.3)
Chloride: 101 mmol/L (ref 98–111)
Creatinine, Ser: 1 mg/dL (ref 0.44–1.00)
GFR calc Af Amer: 59 mL/min — ABNORMAL LOW (ref >60)
GFR calc non Af Amer: 51 mL/min — ABNORMAL LOW (ref >60)
Glucose, Bld: 101 mg/dL — ABNORMAL HIGH (ref 70–99)
Potassium: 4.2 mmol/L (ref 3.5–5.1)
Sodium: 141 mmol/L (ref 135–145)
Total Bilirubin: 1.4 mg/dL — ABNORMAL HIGH (ref 0.3–1.2)
Total Protein: 7.1 g/dL (ref 6.5–8.1)

## 2019-10-02 LAB — CBC WITH DIFFERENTIAL/PLATELET
Abs Immature Granulocytes: 0.07 10*3/uL (ref 0.00–0.07)
Basophils Absolute: 0 10*3/uL (ref 0.0–0.1)
Basophils Relative: 0 %
Eosinophils Absolute: 0 10*3/uL (ref 0.0–0.5)
Eosinophils Relative: 0 %
HCT: 43.9 % (ref 36.0–46.0)
Hemoglobin: 14.9 g/dL (ref 12.0–15.0)
Immature Granulocytes: 1 %
Lymphocytes Relative: 6 %
Lymphs Abs: 0.8 10*3/uL (ref 0.7–4.0)
MCH: 36.2 pg — ABNORMAL HIGH (ref 26.0–34.0)
MCHC: 33.9 g/dL (ref 30.0–36.0)
MCV: 106.6 fL — ABNORMAL HIGH (ref 80.0–100.0)
Monocytes Absolute: 0.8 10*3/uL (ref 0.1–1.0)
Monocytes Relative: 7 %
Neutro Abs: 10.3 10*3/uL — ABNORMAL HIGH (ref 1.7–7.7)
Neutrophils Relative %: 86 %
Platelets: 223 10*3/uL (ref 150–400)
RBC: 4.12 MIL/uL (ref 3.87–5.11)
RDW: 14.3 % (ref 11.5–15.5)
WBC: 11.9 10*3/uL — ABNORMAL HIGH (ref 4.0–10.5)
nRBC: 0 % (ref 0.0–0.2)

## 2019-10-02 LAB — TROPONIN I (HIGH SENSITIVITY): Troponin I (High Sensitivity): 118 ng/L (ref <18)

## 2019-10-02 MED ORDER — LORAZEPAM 2 MG/ML IJ SOLN
1.0000 mg | Freq: Once | INTRAMUSCULAR | Status: AC
  Administered 2019-10-02: 1 mg via INTRAVENOUS
  Filled 2019-10-02: qty 1

## 2019-10-02 MED ORDER — ONDANSETRON HCL 4 MG/2ML IJ SOLN: 4.0000 mg | Freq: Four times a day (QID) | INTRAMUSCULAR | Status: DC | PRN

## 2019-10-02 MED ORDER — LISINOPRIL 10 MG PO TABS
10.0000 mg | ORAL_TABLET | Freq: Every day | ORAL | Status: DC
  Administered 2019-10-03 – 2019-10-08 (×6): 10 mg via ORAL
  Filled 2019-10-02 (×6): qty 1

## 2019-10-02 MED ORDER — APIXABAN 2.5 MG PO TABS
2.5000 mg | ORAL_TABLET | Freq: Every day | ORAL | Status: DC
  Administered 2019-10-03 – 2019-10-04 (×2): 2.5 mg via ORAL
  Filled 2019-10-02 (×2): qty 1

## 2019-10-02 MED ORDER — ONDANSETRON HCL 4 MG PO TABS: 4.0000 mg | ORAL_TABLET | Freq: Four times a day (QID) | ORAL | Status: DC | PRN

## 2019-10-02 MED ORDER — SODIUM CHLORIDE 0.9 % IV SOLN
1.0000 g | Freq: Once | INTRAVENOUS | Status: AC
  Administered 2019-10-02: 1 g via INTRAVENOUS
  Filled 2019-10-02: qty 10

## 2019-10-02 MED ORDER — LORAZEPAM 2 MG/ML IJ SOLN
0.5000 mg | Freq: Once | INTRAMUSCULAR | Status: AC
  Administered 2019-10-02: 0.5 mg via INTRAMUSCULAR
  Filled 2019-10-02: qty 1

## 2019-10-02 MED ORDER — ALBUTEROL SULFATE (2.5 MG/3ML) 0.083% IN NEBU: 3.0000 mL | INHALATION_SOLUTION | Freq: Four times a day (QID) | RESPIRATORY_TRACT | Status: DC | PRN

## 2019-10-02 MED ORDER — LACTATED RINGERS IV SOLN
INTRAVENOUS | Status: AC
  Administered 2019-10-02: 23:00:00 via INTRAVENOUS

## 2019-10-02 MED ORDER — SENNA 8.6 MG PO TABS: 1.0000 | ORAL_TABLET | Freq: Every day | ORAL | Status: DC | PRN

## 2019-10-02 MED ORDER — LEVOTHYROXINE SODIUM 25 MCG PO TABS: 25.0000 ug | ORAL_TABLET | Freq: Every day | ORAL | Status: DC

## 2019-10-02 MED ORDER — DOCUSATE SODIUM 100 MG PO CAPS
100.0000 mg | ORAL_CAPSULE | Freq: Two times a day (BID) | ORAL | Status: DC
  Administered 2019-10-03 – 2019-10-08 (×10): 100 mg via ORAL
  Filled 2019-10-02 (×10): qty 1

## 2019-10-02 MED ORDER — METOPROLOL SUCCINATE ER 100 MG PO TB24
100.0000 mg | ORAL_TABLET | Freq: Every day | ORAL | Status: DC
  Administered 2019-10-03 – 2019-10-08 (×6): 100 mg via ORAL
  Filled 2019-10-02 (×6): qty 1

## 2019-10-02 MED ORDER — AMIODARONE HCL 200 MG PO TABS
200.0000 mg | ORAL_TABLET | Freq: Every day | ORAL | Status: DC
  Administered 2019-10-04 – 2019-10-08 (×5): 200 mg via ORAL
  Filled 2019-10-02 (×5): qty 1

## 2019-10-02 MED ORDER — GABAPENTIN 100 MG PO CAPS: 200.0000 mg | ORAL_CAPSULE | Freq: Two times a day (BID) | ORAL | Status: DC

## 2019-10-02 MED ORDER — ACETAMINOPHEN 325 MG PO TABS
650.0000 mg | ORAL_TABLET | Freq: Four times a day (QID) | ORAL | Status: DC | PRN
  Administered 2019-10-04 – 2019-10-08 (×8): 650 mg via ORAL
  Filled 2019-10-02 (×8): qty 2

## 2019-10-02 MED ORDER — ASPIRIN 300 MG RE SUPP
300.0000 mg | Freq: Once | RECTAL | Status: AC
  Administered 2019-10-02: 300 mg via RECTAL
  Filled 2019-10-02: qty 1

## 2019-10-02 MED ORDER — CYCLOSPORINE 0.05 % OP EMUL
1.0000 [drp] | Freq: Two times a day (BID) | OPHTHALMIC | Status: DC
  Administered 2019-10-03 – 2019-10-08 (×11): 1 [drp] via OPHTHALMIC
  Filled 2019-10-02 (×12): qty 30

## 2019-10-02 MED ORDER — ACETAMINOPHEN 650 MG RE SUPP: 650.0000 mg | Freq: Four times a day (QID) | RECTAL | Status: DC | PRN

## 2019-10-02 NOTE — H&P (Signed)
History and Physical    [name] [id number] DOB: May 03, 1931 DOA: 10/02/2019  PCP: Jani Gravel, MD Consultants:  Einar Gip - cardiology Patient coming from:  Home - lives with husband; NOK: Timiyah, Mirchandani, [phone]  Chief Complaint: AMS  HPI: [name] is a 85 y.o. female with medical history significant of pacemaker placement; dementia; chronic diastolic CHF; breast cancer; and long-term AC for ? Presenting with AMS.  Her husband reports that she apparently fell overnight.  She went to sit on the toilet and apparently missed it this morning.  She fell between the bathtub and toilet and could not move.  In the past few days to weeks, she has been different - going to bed earlier; went to bed without getting dressed, which is unusual for her; she woke him up in the middle of the night trying to get to the bathroom in a wheelchair and then tried to go to bed in the spare bedroom; tried to get into their bed (which she hasn't done for 6 years); tried to turn on the TV with the telephone and tried to call out using the remote control.  She does have chronic memory problems.  About 3 different days out of the last 5, she has complained of a severe headache.  He is concerned that this may be "memory problems" like happens to the elderly sometimes.  He would consider placement at [facility] if she doesn't get better.   ED Course:  Fall, AMS.  1- elevated troponin.  Altered, SOB, no c/o CP.  2- AMS, intermittent confusion.  Markedly worse last night.  No apparent infection.  Given a dose of antibiotics just in case.  No focal deficits.  Review of Systems: As per HPI; otherwise review of systems reviewed and negative.   Ambulatory Status:   Ambulates minimally, mostly uses a wheelchair  Past Medical History:  Diagnosis Date   Acute on chronic diastolic CHF (congestive heart failure) (HCC) 05/18/2015   Acute respiratory failure (HCC) 06/09/2016   Anemia    Arthritis    Arthritis  pain    Asthma    Blood transfusion    Bronchitis    Cancer (HCC)    lt breast   CHF (congestive heart failure) (HCC)    Constipation    Current use of long term anticoagulation    Cystitis    Encounter for care of pacemaker 06/13/2019   Heart block    Complete heart block post surgical requiring pacer   Humerus shaft fracture    nonsurgical, June 2017   Hypertr obst cardiomyop    Required septal myotomy   Hypokalemia    Postoperative, improved   Hyponatremia    Mild postoperative, improved   Memory difficulty    Mitral regurgitation    Pacemaker    Urinary incontinence     Past Surgical History:  Procedure Laterality Date   ABDOMINAL HYSTERECTOMY     ABOVE KNEE LEG AMPUTATION Right    Anterior cervical diskectomy and fusion     C-7   BELOW KNEE LEG AMPUTATION Right    BREAST BIOPSY     Left, negative   BREAST LUMPECTOMY  2012   left breast   cardiomyopathy     COLONOSCOPY     FEMUR FRACTURE SURGERY     Right   FRACTURE SURGERY     JOINT REPLACEMENT     ORIF ELBOW FRACTURE Right 04/08/2018   Procedure: OPEN REDUCTION INTERNAL FIXATION (ORIF) ELBOW/OLECRANON FRACTURE;  Surgeon: Renette Butters, MD;  Location: [place];  Service: Orthopedics;  Laterality: Right;   PACEMAKER PLACEMENT  2006/11-27-2013   MDT dual chamber pacemaker implanted 2006 with gen change by Dr Lovena Le 11-27-2013   PERMANENT PACEMAKER GENERATOR CHANGE N/A 11/27/2013   Procedure: PERMANENT PACEMAKER GENERATOR CHANGE;  Surgeon: Evans Lance, MD;  Location: [facility] CATH LAB;  Service: Cardiovascular;  Laterality: N/A;   REPLACEMENT TOTAL KNEE BILATERAL     Septal myotomy     SHOULDER SURGERY     Right   WRIST FRACTURE SURGERY     Right    Social History   Socioeconomic History   Marital status: Married    Spouse name: Not on file   Number of children: 2   Years of education: Not on file   Highest education level: Not on file  Occupational History    Not on file  Social Needs   Financial resource strain: Not on file   Food insecurity    Worry: Not on file    Inability: Not on file   Transportation needs    Medical: Not on file    Non-medical: Not on file  Tobacco Use   Smoking status: Never Smoker   Smokeless tobacco: Never Used  Substance and Sexual Activity   Alcohol use: Not Currently   Drug use: Never   Sexual activity: Not Currently  Lifestyle   Physical activity    Days per week: Not on file    Minutes per session: Not on file   Stress: Not on file  Relationships   Social connections    Talks on phone: Not on file    Gets together: Not on file    Attends religious service: Not on file    Active member of club or organization: Not on file    Attends meetings of clubs or organizations: Not on file    Relationship status: Not on file   Intimate partner violence    Fear of current or ex partner: Not on file    Emotionally abused: Not on file    Physically abused: Not on file    Forced sexual activity: Not on file  Other Topics Concern   Not on file  Social History Narrative   ** Merged History Encounter **       Married   Two children 1 passed   Husband will be assisting with care after surgery    Allergies  Allergen Reactions   Codeine Nausea And Vomiting   Morphine And Related Nausea And Vomiting   Sulfa Antibiotics Nausea And Vomiting   Ceftaroline Rash   Codeine Nausea And Vomiting   Morphine Nausea And Vomiting   Sulfa Antibiotics Nausea And Vomiting and Nausea Only    Family History  Problem Relation Age of Onset   Heart disease Mother    Hypertension Mother    Pancreatic cancer Mother    Cancer Mother        pancreatic   Pancreatic cancer Father    Cancer Father        pancreatic   Diabetes Brother    Pancreatic cancer Other    Heart disease Brother        Enlarged heart    Prior to Admission medications   Medication Sig Start Date End Date Taking?  Authorizing Provider  acetaminophen (TYLENOL) 500 MG tablet Take 1,000 mg by mouth every 6 (six) hours as needed for headache (pain).    [provider]  albuterol (PROAIR HFA) 108 (90 BASE) MCG/ACT inhaler Inhale 2 puffs into the lungs every 6 (six) hours as needed for wheezing. 06/05/14   Evans Lance, MD  albuterol (PROVENTIL) (2.5 MG/3ML) 0.083% nebulizer solution Take 3 mLs (2.5 mg total) by nebulization every 6 (six) hours as needed for wheezing or shortness of breath. 06/13/16   Thurnell Lose, MD  alendronate (FOSAMAX) 70 MG tablet Take 70 mg by mouth every Thursday. 03/25/18   [provider]  amiodarone (PACERONE) 200 MG tablet TAKE 1 TABLET BY MOUTH DAILY 04/04/19   Adrian Prows, MD  apixaban (ELIQUIS) 2.5 MG TABS tablet Take 1 tablet (2.5 mg total) by mouth 2 (two) times daily. Patient taking differently: Take 2.5 mg by mouth every morning.  10/01/14   Angiulli, Lavon Paganini, PA-C  BEVESPI AEROSPHERE 9-4.8 MCG/ACT AERO Take 1 puff by mouth daily as needed for shortness of breath or wheezing. 12/31/18   [provider]  calcium citrate-vitamin D (CITRACAL+D) 315-200 MG-UNIT per tablet Take 1 tablet by mouth 2 (two) times daily. Patient not taking: Reported on 04/18/2019 10/01/14   Angiulli, Lavon Paganini, PA-C  cyclobenzaprine (FLEXERIL) 5 MG tablet Take 1 tablet (5 mg total) by mouth daily as needed for muscle spasms. 01/29/19   Black, Lezlie Octave, NP  cycloSPORINE (RESTASIS) 0.05 % ophthalmic emulsion Place 1 drop into both eyes 2 (two) times daily. 10/01/14   Angiulli, Lavon Paganini, PA-C  fluticasone (FLONASE) 50 MCG/ACT nasal spray Place 2 sprays into both nostrils 2 (two) times daily as needed for allergies.     [provider]  furosemide (LASIX) 20 MG tablet Take 1 tablet (20 mg total) by mouth daily. 02/18/19   Adrian Prows, MD  gabapentin (NEURONTIN) 100 MG capsule Take 200 mg by mouth 2 (two) times daily. 12/27/18   [provider]  guaiFENesin-dextromethorphan  (ROBITUSSIN DM) 100-10 MG/5ML syrup Take 10 mLs by mouth every 4 (four) hours as needed for cough. Patient not taking: Reported on 02/18/2019 06/13/16   Thurnell Lose, MD  levothyroxine (SYNTHROID, LEVOTHROID) 25 MCG tablet Take 25 mcg by mouth at bedtime.     [provider]  lisinopril (PRINIVIL,ZESTRIL) 10 MG tablet Take 1 tablet (10 mg total) by mouth daily. 03/05/19 07/03/19  Adrian Prows, MD  metoprolol succinate (TOPROL-XL) 100 MG 24 hr tablet Take 100 mg by mouth daily. 03/04/18   [provider]  Multiple Vitamin (MULTIVITAMIN WITH MINERALS) TABS tablet Take 1 tablet by mouth daily.    [provider]  potassium chloride SA (K-DUR,KLOR-CON) 20 MEQ tablet Take 1 tablet (20 mEq total) by mouth daily. 02/18/19   Adrian Prows, MD  senna (SENOKOT) 8.6 MG TABS tablet Take 1 tablet (8.6 mg total) by mouth daily as needed for mild constipation. 01/29/19   Black, Lezlie Octave, NP  umeclidinium bromide (INCRUSE ELLIPTA) 62.5 MCG/INH AEPB Inhale 1 puff into the lungs 2 (two) times daily as needed (wheezing). Patient not taking: Reported on 02/04/2019 01/29/19   Radene Gunning, NP  vitamin B-12 (CYANOCOBALAMIN) 1000 MCG tablet Take 1,000 mcg by mouth daily.     [provider]    Physical Exam: Vitals:   10/02/19 1124 10/02/19 1510 10/02/19 1534  BP: 128/88  (!) 168/99  Pulse: 85  64  Resp: 20  18  Temp: 97.9 F (36.6 C) 98.7 F (37.1 C)   TempSrc: Oral Rectal   SpO2: 97%  96%      General:  Appears calm and comfortable  and is NAD  Eyes:  PERRL, EOMI, normal lids, iris  ENT:  grossly normal hearing, lips & tongue, mmm; appropriate dentition  Neck:  no LAD, masses or thyromegaly; no carotid bruits  Cardiovascular:  RRR, no m/r/g. No LE edema.   Respiratory:   CTA bilaterally with no wheezes/rales/rhonchi.  Normal respiratory effort.  Abdomen:  soft, NT, ND, NABS  Back:   normal alignment, no CVAT  Skin:  no rash or induration seen on limited  exam  Musculoskeletal:  grossly normal tone BUE/BLE, good ROM, no bony abnormality  Lower extremity:  No LE edema.  Limited foot exam with no ulcerations.  2+ distal pulses.  Psychiatric:  grossly normal mood and affect, speech fluent and appropriate, AOx3  Neurologic:  CN 2-12 grossly intact, moves all extremities in coordinated fashion, sensation intact    Radiological Exams on Admission: Ct Head Wo Contrast  Result Date: 10/02/2019 CLINICAL DATA:  Fall, altered mental status. EXAM: CT HEAD WITHOUT CONTRAST CT CERVICAL SPINE WITHOUT CONTRAST TECHNIQUE: Multidetector CT imaging of the head and cervical spine was performed following the standard protocol without intravenous contrast. Multiplanar CT image reconstructions of the cervical spine were also generated. COMPARISON:  None.  04/06/2018 FINDINGS: CT HEAD FINDINGS Brain: Changes of atrophy and chronic microvascular ischemic changes. No signs of intracranial hemorrhage or extra-axial fluid collection. No midline shift. Vascular: No hyperdense vessel or unexpected calcification. Skull: No signs of acute fracture.  Mastoid air cells are clear. Sinuses/Orbits: No air-fluid level.  No signs of sinus disease. Other: Choose 1 CT CERVICAL SPINE FINDINGS Alignment: Alignment is unchanged based on comparison with previous imaging. Accentuation of cervical lordosis is noted, associated with degenerative changes throughout the spine. Postoperative changes are noted at C6-C7 with anterior discectomy and fusion as before. Mild anterolisthesis of C7 on T1 is unchanged as compared to the prior exam. Skull base and vertebrae: No acute fracture. No primary bone lesion or focal pathologic process. Soft tissues and spinal canal: No prevertebral fluid or swelling. No visible canal hematoma. Disc levels: Multilevel degenerative changes as discussed above worse at C4-5, C5-6 and C6-7. Facet arthropathy is redemonstrated at these levels. Fusion again noted at C6-C7.  Upper chest: Negative. Other: No signs of soft tissue mass. Streak artifact from anterior cervical hardware. IMPRESSION: Chronic microvascular ischemic changes and generalized atrophy in the brain without acute intracranial finding. Extensive spinal degenerative changes and signs of anterior cervical discectomy and fusion, with no signs of acute spine fracture or subluxation. Electronically Signed   By: Zetta Bills M.D.   On: 10/02/2019 13:17   Ct Cervical Spine Wo Contrast  Result Date: 10/02/2019 CLINICAL DATA:  Fall, altered mental status. EXAM: CT HEAD WITHOUT CONTRAST CT CERVICAL SPINE WITHOUT CONTRAST TECHNIQUE: Multidetector CT imaging of the head and cervical spine was performed following the standard protocol without intravenous contrast. Multiplanar CT image reconstructions of the cervical spine were also generated. COMPARISON:  None.  04/06/2018 FINDINGS: CT HEAD FINDINGS Brain: Changes of atrophy and chronic microvascular ischemic changes. No signs of intracranial hemorrhage or extra-axial fluid collection. No midline shift. Vascular: No hyperdense vessel or unexpected calcification. Skull: No signs of acute fracture.  Mastoid air cells are clear. Sinuses/Orbits: No air-fluid level.  No signs of sinus disease. Other: Choose 1 CT CERVICAL SPINE FINDINGS Alignment: Alignment is unchanged based on comparison with previous imaging. Accentuation of cervical lordosis is noted, associated with degenerative changes throughout the spine. Postoperative changes are noted at C6-C7 with anterior discectomy and fusion  as before. Mild anterolisthesis of C7 on T1 is unchanged as compared to the prior exam. Skull base and vertebrae: No acute fracture. No primary bone lesion or focal pathologic process. Soft tissues and spinal canal: No prevertebral fluid or swelling. No visible canal hematoma. Disc levels: Multilevel degenerative changes as discussed above worse at C4-5, C5-6 and C6-7. Facet arthropathy is  redemonstrated at these levels. Fusion again noted at C6-C7. Upper chest: Negative. Other: No signs of soft tissue mass. Streak artifact from anterior cervical hardware. IMPRESSION: Chronic microvascular ischemic changes and generalized atrophy in the brain without acute intracranial finding. Extensive spinal degenerative changes and signs of anterior cervical discectomy and fusion, with no signs of acute spine fracture or subluxation. Electronically Signed   By: Zetta Bills M.D.   On: 10/02/2019 13:17   Dg Chest Portable 1 View  Result Date: 10/02/2019 CLINICAL DATA:  Found on floor.  Fall. EXAM: PORTABLE CHEST 1 VIEW COMPARISON:  01/28/2019 FINDINGS: Right pacer remains in place, unchanged. Prior CABG. Cardiomegaly cysts with mild vascular congestion. No confluent opacities, effusions, edema or pneumothorax. No visible acute bony abnormality. IMPRESSION: Cardiomegaly with vascular congestion.  No acute findings. Electronically Signed   By: Rolm Baptise M.D.   On: 10/02/2019 12:35    EKG: Unable to view.  Per Dr. Stark Jock: Ventricularly paced with rate 86   Labs on Admission: I have personally reviewed the available labs and imaging studies at the time of the admission.  Pertinent labs:   Anion gap 17 CK 188 HS troponin 118 (normal conventional troponin in 01/2019) WBC 11.9 UA: moderate LE, 100 protein   Assessment/Plan Principal Problem:   Encephalopathy Active Problems:   Atrial fibrillation (HCC)   Essential hypertension   CHF (congestive heart failure) (HCC)   CKD (chronic kidney disease)   Elevated troponin   Encephalopathy -It is difficult to tell how much of this is new, as the patient's husband reports apparent long-term memory problems with recent acute change; recent is relative, weeks or longer -Evaluation thus far unremarkable other than elevated troponin (see below), which seems unlikely to cause AMS -While initial concern was for infection in the setting of chronic  immunosuppression and so she was given Rocephin empirically, there is no other evidence of infection at this time -She does not appear to be dehydrated and she does not have acute renal failure at this time -Based on unremarkable evaluation with current ability to protect her airway, will observe for now on telemetry with gentle IVF hydration -50% of patients with delirium while hospitalized will be institutionalized at 6 months, and these patients have a 25% mortality at 6 months -The family would benefit from being referred to the [facility] and also provided with the [employer] website -If the patient does not return to prior baseline, she may require placement - her husband is well into his 90s and is unable to lift/carry her at home -She is on a number of medications and this may be contributing; hold Flexeril -PT/OT/SW consult due to likelihood that patient will need placement  Elevated troponin -Patient without apparent report of chest pain -Possible demand ischemia, although uncertain etiology -Delta HS troponin is pending -Dr. Einar Gip has been consulted -No heparin for now given low suspicion for ACS, although her dose of Eliquis has been suboptimal and so this may need to be added; will defer to cardiology  Hypothyroidism -Check TSH -Continue Synthroid at current dose for now  HTN -Continue Lisinopril, Toprol XL  CKD -Stage 3a CKD at baseline, appears stable -Will trend  CHF -2/11 echo with presented EF and grade 2 diastolic dysfunction -Currently low suspicion for CHF as a contributor, appears to be compensated  Afib -Rate control with Amiodarone -Continue Eliquis, but dose is suboptimal so consider increasing to BID  COPD -She has several listed controller medications but all are prn and so less likely to be beneficial -For now, albuterol HFA prn and will follow    Note: This patient has been tested and is negative for the novel coronavirus  COVID-19.  DVT prophylaxis:  Lovenox  Code Status:  DNR - confirmed with family Family Communication: None present; I spoke with her husband (mid-90s) by telephone Disposition Plan:  Home vs. SNF once clinically improved Consults called: SW/PT/OT Admission status: It is my clinical opinion that referral for OBSERVATION is reasonable and necessary in this patient based on the above information provided. The aforementioned taken together are felt to place the patient at high risk for further clinical deterioration. However it is anticipated that the patient may be medically stable for discharge from the hospital within 24 to 48 hours.    Karmen Bongo MD [facility]   How to contact the [facility] Attending or Consulting provider [place] or covering provider during after hours [place], for this patient?  1. Check the care team in [facility] and look for a) attending/consulting [facility] provider listed and b) the [facility] team listed 2. Log into [email or web address] and use [place]'s universal password to access. If you do not have the password, please contact the hospital operator. 3. Locate the [name] provider you are looking for under [facility] and page to a number that you can be directly reached. 4. If you still have difficulty reaching the provider, please page the [facility] (Director on Call) for the Hospitalists listed on amion for assistance.   10/02/2019, 5:07 PM

## 2019-10-02 NOTE — ED Notes (Signed)
Pt got 5 mg Haldol IM by ems en route due to agitation

## 2019-10-02 NOTE — TOC Initial Note (Signed)
Transition of Care [facility]) - Initial/Assessment Note    Patient Details  Name: [name] MRN: [id number] Date of Birth: [birth year]  Transition of Care [facility]) CM/SW Contact:    Tannia Contino Dimitri Ped, LCSW Phone Number: 10/02/2019, 9:36 PM  Clinical Narrative:  2nd shift CSW in contact with pts husband, Lisia Desarro, [facility]: [phone], to address consult concerning possible placement. CSW conducted [facility] assessment to assess pts current status.   Pt resides at home alone with spouse [name]. Pts husband reports that she is an amputee but does not wear her prosthesis. [name] goes into detail and explains that up until this week, patient was able to perform all ADLs such as toileting, bathing, dressing and feeding independently. [name] states that patient ambulates around the home through use of a walker and wheelchair.   Pt has a hx of home health and many admissions in a SNF. [name] reports that patient has been admitted into the SNF, [facility] 3-4x in the past. Pt then discharged home with home health services; [name] could not recall the name of the agency providing home health services. When asked when is the last time that patient has received home health services, [name] responded by stating as early as December 2019.   CSW inquired about patients consistency with mediation compliance. [name] reported that he is unsure if patient is compliant with her medications because "she usually manages that by herself". [name] went into detail and explained that she has the pill packs to make medication administering simple.   PT/OT orders are in place. CSW awaiting PT/OT eval recommendations. TOC team will continue to follow patient for any discharge related needs.   [place] Transitions of Care  Clinical Social Worker  Ph: [phone]           Expected Discharge Plan: Skilled Nursing Facility Barriers to Discharge: Continued Medical Work up   Patient Goals and CMS  Choice Patient states their goals for this hospitalization and ongoing recovery are:: Family states their goal is to follow the hosptials recommendation      Expected Discharge Plan and Services Expected Discharge Plan: [place] In-house Referral: Clinical Social Work Discharge Planning Services: CM Consult   Living arrangements for the past 2 months: [place]                                      Prior Living Arrangements/Services Living arrangements for the past 2 months: Single Family Home Lives with:: Spouse Patient language and need for interpreter reviewed:: Yes Do you feel safe going back to the place where you live?: Yes      Need for Family Participation in Patient Care: Yes (Comment) Care giver support system in place?: Yes (comment) Current home services: DME Criminal Activity/Legal Involvement Pertinent to Current Situation/Hospitalization: No - Comment as needed  Activities of Daily Living      Permission Sought/Granted Permission sought to share information with : Case Manager, Family Supports, Customer service manager Permission granted to share information with : Yes, Release of Information Signed  Share Information with NAME: France Powe  Permission granted to share info w AGENCY: [facility]  Permission granted to share info w Relationship: Spouse  Permission granted to share info w Contact Information: PH: [phone]  Emotional Assessment Appearance:: Appears stated age Attitude/Demeanor/Rapport: Unable to Assess Affect (typically observed): Unable to Assess Orientation: : Fluctuating Orientation (  Suspected and/or reported Sundowners) Alcohol / Substance Use: Not Applicable Psych Involvement: No (comment)  Admission diagnosis:  ams/fall Patient Active Problem List   Diagnosis Date Noted  . Encephalopathy 10/02/2019  . Elevated troponin 10/02/2019  . AMS (altered mental status) 10/02/2019  . Heart  block   . Encounter for care of pacemaker 06/13/2019  . Right carotid bruit 02/18/2019  . CKD (chronic kidney disease) 01/29/2019  . Hypoglycemia 01/29/2019  . CHF (congestive heart failure) ([place]) 01/28/2019  . Rt Olecranon fracture 04/07/2018  . Essential hypertension 04/07/2018  . Rt Hip Fracture ([place]) 04/06/2018  . Obstructive lung disease (generalized) ([place]) 10/04/2017  . Humerus fracture 06/09/2016  . Hx of right BKA ([place]) 06/09/2016  . Malnutrition of moderate degree 06/09/2016  . Unilateral AKA ([place]) 09/23/2014  . Heart block AV complete ([place]) 07/03/2013  . Atrial fibrillation ([place]) 07/03/2013  . Breast cancer, left breast ([place]) 04/24/2013  . Other symptoms involving cardiovascular system 12/22/2010  . MITRAL REGURGITATION 06/17/2010  . HOCM (hypertrophic obstructive cardiomyopathy) ([place]) 06/17/2010  . History of heart block 06/17/2010  . HEMORRHOIDS 06/17/2010  . BRONCHITIS 06/17/2010  . ASTHMA 06/17/2010  . CYSTITIS 06/17/2010  . URINARY INCONTINENCE 06/17/2010  . Pacemaker Medtronic Adapta dual-chamber pacemaker 2006, generator change 11/27/2013, programmed to VVI  06/21/2005   PCP:  Jani Gravel, MD Pharmacy:   [facility], [address] [place] [address] Phone: [phone] Fax: [phone]  [facility], [place] [address] [place] [address] Phone: [phone] Fax: [phone]  [facility] [place], [address] [place] [address] Phone: [phone] Fax: [phone]     Social Determinants of Health (SDOH) Interventions    Readmission Risk Interventions No flowsheet data found.

## 2019-10-02 NOTE — ED Triage Notes (Signed)
Pt here from home with c/o fall , found in bathroom on floor by husband , unknown time on floor , pt has been declining  Over the last 7 months according to husband

## 2019-10-02 NOTE — Progress Notes (Signed)
Consult request has been received. CSW attempting to follow up at present time  Kenrick Pore M. Leotis Isham LCSWA Transitions of Care  Clinical Social Worker  Ph: [phone] 

## 2019-10-02 NOTE — ED Provider Notes (Addendum)
[facility] EMERGENCY DEPARTMENT Provider Note   CSN: [id number] Arrival date & time: 10/02/19  1111     History   Chief Complaint Chief Complaint  Patient presents with   Fall   Altered Mental Status    HPI [name] is a 85 y.o. female.     HPI   Pt is an 85 y/o F with a h/o CHF, anemia, asthma, breast CA, chronic anticoagulation, HOCM, MR, right BKA, who presents to the ED today for eval for fall and AMS. She was brought from home by EMS. According to them she was found on the bathroom floor this AM and he is not sure how she fell or how long she was on the ground. Husband reported that patient has had mental decline over the last few months that has worsened over the last week and acutely changes lastn ight. En route pt became agitated and required 5 mg IM haldol.  Pt does not remember any details surrounding the fall there fore there is a level 5 caveat.   Pt is c/o sob and a cough that started "about 15 minutes ago". Denies any pain.   Pt is on eliquis.   Past Medical History:  Diagnosis Date   Acute on chronic diastolic CHF (congestive heart failure) (HCC) 05/18/2015   Acute respiratory failure (HCC) 06/09/2016   Anemia    Arthritis    Arthritis pain    Asthma    Blood transfusion    Bronchitis    Cancer ([place])    lt breast   CHF (congestive heart failure) ([place])    Constipation    Current use of long term anticoagulation    Cystitis    Encounter for care of pacemaker 06/13/2019   Heart block    Complete heart block post surgical requiring pacer   Humerus shaft fracture    nonsurgical, June 2017   Hypertr obst cardiomyop    Required septal myotomy   Hypokalemia    Postoperative, improved   Hyponatremia    Mild postoperative, improved   Memory difficulty    Mitral regurgitation    Pacemaker    Urinary incontinence     Patient Active Problem List   Diagnosis Date Noted   Heart block    Encounter for  care of pacemaker 06/13/2019   Right carotid bruit 02/18/2019   CKD (chronic kidney disease) 01/29/2019   Hypoglycemia 01/29/2019   CHF (congestive heart failure) ([place]) 01/28/2019   Rt Olecranon fracture 04/07/2018   Essential hypertension 04/07/2018   Rt Hip Fracture ([place]) 04/06/2018   Obstructive lung disease (generalized) ([place]) 10/04/2017   Humerus fracture 06/09/2016   Hx of right BKA ([place]) 06/09/2016   Malnutrition of moderate degree 06/09/2016   Unilateral AKA ([place]) 09/23/2014   Heart block AV complete ([place]) 07/03/2013   Atrial fibrillation ([place]) 07/03/2013   Breast cancer, left breast ([place]) 04/24/2013   Other symptoms involving cardiovascular system 12/22/2010   MITRAL REGURGITATION 06/17/2010   HOCM (hypertrophic obstructive cardiomyopathy) ([place]) 06/17/2010   History of heart block 06/17/2010   HEMORRHOIDS 06/17/2010   BRONCHITIS 06/17/2010   ASTHMA 06/17/2010   CYSTITIS 06/17/2010   URINARY INCONTINENCE 06/17/2010   Pacemaker Medtronic Adapta dual-chamber pacemaker 2006, generator change 11/27/2013, programmed to VVI  06/21/2005    Past Surgical History:  Procedure Laterality Date   ABDOMINAL HYSTERECTOMY     ABOVE KNEE LEG AMPUTATION Right    Anterior cervical diskectomy and fusion     C-7  BELOW KNEE LEG AMPUTATION Right    BREAST BIOPSY     Left, negative   BREAST LUMPECTOMY  2012   left breast   cardiomyopathy     COLONOSCOPY     FEMUR FRACTURE SURGERY     Right   FRACTURE SURGERY     JOINT REPLACEMENT     ORIF ELBOW FRACTURE Right 04/08/2018   Procedure: OPEN REDUCTION INTERNAL FIXATION (ORIF) ELBOW/OLECRANON FRACTURE;  Surgeon: Renette Butters, MD;  Location: [facility];  Service: Orthopedics;  Laterality: Right;   PACEMAKER PLACEMENT  2006/11-27-2013   MDT dual chamber pacemaker implanted 2006 with gen change by Dr Lovena Le 11-27-2013   PERMANENT PACEMAKER GENERATOR CHANGE N/A 11/27/2013   Procedure: PERMANENT  PACEMAKER GENERATOR CHANGE;  Surgeon: Evans Lance, MD;  Location: [facility] CATH LAB;  Service: Cardiovascular;  Laterality: N/A;   REPLACEMENT TOTAL KNEE BILATERAL     Septal myotomy     SHOULDER SURGERY     Right   WRIST FRACTURE SURGERY     Right     OB History   No obstetric history on file.      Home Medications    Prior to Admission medications   Medication Sig Start Date End Date Taking? Authorizing Provider  acetaminophen (TYLENOL) 500 MG tablet Take 1,000 mg by mouth every 6 (six) hours as needed for headache (pain).    [provider]  albuterol (PROAIR HFA) 108 (90 BASE) MCG/ACT inhaler Inhale 2 puffs into the lungs every 6 (six) hours as needed for wheezing. 06/05/14   Evans Lance, MD  albuterol (PROVENTIL) (2.5 MG/3ML) 0.083% nebulizer solution Take 3 mLs (2.5 mg total) by nebulization every 6 (six) hours as needed for wheezing or shortness of breath. 06/13/16   Thurnell Lose, MD  alendronate (FOSAMAX) 70 MG tablet Take 70 mg by mouth every Thursday. 03/25/18   [provider]  amiodarone (PACERONE) 200 MG tablet TAKE 1 TABLET BY MOUTH DAILY 04/04/19   Adrian Prows, MD  apixaban (ELIQUIS) 2.5 MG TABS tablet Take 1 tablet (2.5 mg total) by mouth 2 (two) times daily. Patient taking differently: Take 2.5 mg by mouth every morning.  10/01/14   Angiulli, Lavon Paganini, PA-C  BEVESPI AEROSPHERE 9-4.8 MCG/ACT AERO Take 1 puff by mouth daily as needed for shortness of breath or wheezing. 12/31/18   [provider]  calcium citrate-vitamin D (CITRACAL+D) 315-200 MG-UNIT per tablet Take 1 tablet by mouth 2 (two) times daily. Patient not taking: Reported on 04/18/2019 10/01/14   Angiulli, Lavon Paganini, PA-C  cyclobenzaprine (FLEXERIL) 5 MG tablet Take 1 tablet (5 mg total) by mouth daily as needed for muscle spasms. 01/29/19   Black, Lezlie Octave, NP  cycloSPORINE (RESTASIS) 0.05 % ophthalmic emulsion Place 1 drop into both eyes 2 (two) times daily. 10/01/14   Angiulli,  Lavon Paganini, PA-C  fluticasone (FLONASE) 50 MCG/ACT nasal spray Place 2 sprays into both nostrils 2 (two) times daily as needed for allergies.     [provider]  furosemide (LASIX) 20 MG tablet Take 1 tablet (20 mg total) by mouth daily. 02/18/19   Adrian Prows, MD  gabapentin (NEURONTIN) 100 MG capsule Take 200 mg by mouth 2 (two) times daily. 12/27/18   [provider]  guaiFENesin-dextromethorphan (ROBITUSSIN DM) 100-10 MG/5ML syrup Take 10 mLs by mouth every 4 (four) hours as needed for cough. Patient not taking: Reported on 02/18/2019 06/13/16   Thurnell Lose, MD  levothyroxine (SYNTHROID, LEVOTHROID) 25 MCG tablet  Take 25 mcg by mouth at bedtime.     [provider]  lisinopril (PRINIVIL,ZESTRIL) 10 MG tablet Take 1 tablet (10 mg total) by mouth daily. 03/05/19 07/03/19  Adrian Prows, MD  metoprolol succinate (TOPROL-XL) 100 MG 24 hr tablet Take 100 mg by mouth daily. 03/04/18   [provider]  Multiple Vitamin (MULTIVITAMIN WITH MINERALS) TABS tablet Take 1 tablet by mouth daily.    [provider]  potassium chloride SA (K-DUR,KLOR-CON) 20 MEQ tablet Take 1 tablet (20 mEq total) by mouth daily. 02/18/19   Adrian Prows, MD  senna (SENOKOT) 8.6 MG TABS tablet Take 1 tablet (8.6 mg total) by mouth daily as needed for mild constipation. 01/29/19   Black, Lezlie Octave, NP  umeclidinium bromide (INCRUSE ELLIPTA) 62.5 MCG/INH AEPB Inhale 1 puff into the lungs 2 (two) times daily as needed (wheezing). Patient not taking: Reported on 02/04/2019 01/29/19   Radene Gunning, NP  vitamin B-12 (CYANOCOBALAMIN) 1000 MCG tablet Take 1,000 mcg by mouth daily.     [provider]    Family History Family History  Problem Relation Age of Onset   Heart disease Mother    Hypertension Mother    Pancreatic cancer Mother    Cancer Mother        pancreatic   Pancreatic cancer Father    Cancer Father        pancreatic   Diabetes Brother    Pancreatic cancer Other      Heart disease Brother        Enlarged heart    Social History Social History   Tobacco Use   Smoking status: Never Smoker   Smokeless tobacco: Never Used  Substance Use Topics   Alcohol use: Not Currently   Drug use: Never     Allergies   Codeine, Morphine and related, Sulfa antibiotics, Ceftaroline, Codeine, Morphine, and Sulfa antibiotics   Review of Systems Review of Systems  Unable to perform ROS: Mental status change  Respiratory: Positive for shortness of breath.   Musculoskeletal:       Pain to right arm and left leg     Physical Exam Updated Vital Signs BP (!) 168/99 (BP Location: Right Arm)    Pulse 64    Temp 98.7 F (37.1 C) (Rectal)    Resp 18    SpO2 96%   Physical Exam Vitals signs and nursing note reviewed.  Constitutional:      General: She is not in acute distress.    Appearance: She is well-developed.  HENT:     Head: Normocephalic and atraumatic.  Eyes:     Conjunctiva/sclera: Conjunctivae normal.  Neck:     Musculoskeletal: Neck supple.  Cardiovascular:     Rate and Rhythm: Normal rate and regular rhythm.     Pulses: Normal pulses.     Heart sounds: Normal heart sounds. No murmur.  Pulmonary:     Effort: Pulmonary effort is normal. No respiratory distress.     Breath sounds: Normal breath sounds.  Abdominal:     General: Bowel sounds are normal.     Palpations: Abdomen is soft.     Tenderness: There is no abdominal tenderness. There is no guarding or rebound.  Musculoskeletal:     Comments: No TTP to the cervical, thoracic, or lumbar spine. Skin tear to the right upper arm without underlying bony TTP. Skin tear to the left calf without overlying bony tenderness.  Skin:    General: Skin is warm and  dry.  Neurological:     Mental Status: She is alert.     Comments: Alert, confused, cranial nerves intact, able to intermittently follow simple commands. Moving all extremities. BUE with symmetric strength. Unable to follow commands  with BLE strength testing.   Psychiatric:        Attention and Perception: She is inattentive.        Behavior: Behavior is hyperactive.      ED Treatments / Results  Labs (all labs ordered are listed, but only abnormal results are displayed) Labs Reviewed  CBC WITH DIFFERENTIAL/PLATELET - Abnormal; Notable for the following components:      Result Value   WBC 11.9 (*)    MCV 106.6 (*)    MCH 36.2 (*)    Neutro Abs 10.3 (*)    All other components within normal limits  COMPREHENSIVE METABOLIC PANEL - Abnormal; Notable for the following components:   Glucose, Bld 101 (*)    Total Bilirubin 1.4 (*)    GFR calc non Af Amer 51 (*)    GFR calc Af Amer 59 (*)    Anion gap 17 (*)    All other components within normal limits  URINALYSIS, ROUTINE W REFLEX MICROSCOPIC - Abnormal; Notable for the following components:   Protein, ur 100 (*)    Leukocytes,Ua MODERATE (*)    Non Squamous Epithelial 0-5 (*)    All other components within normal limits  TROPONIN I (HIGH SENSITIVITY) - Abnormal; Notable for the following components:   Troponin I (High Sensitivity) 118 (*)    All other components within normal limits  URINE CULTURE  SARS CORONAVIRUS 2 (TAT 6-24 HRS)  CK  TROPONIN I (HIGH SENSITIVITY)    EKG EKG Interpretation  Date/Time:  Wednesday October 02 2019 11:23:58 EDT Ventricular Rate:  86 PR Interval:    QRS Duration: 174 QT Interval:  460 QTC Calculation: 551 R Axis:   -61 Text Interpretation:  Ventricular paced rhythm Confirmed by Veryl Speak [phone]) on 10/02/2019 11:28:10 AM   Radiology Ct Head Wo Contrast  Result Date: 10/02/2019 CLINICAL DATA:  Fall, altered mental status. EXAM: CT HEAD WITHOUT CONTRAST CT CERVICAL SPINE WITHOUT CONTRAST TECHNIQUE: Multidetector CT imaging of the head and cervical spine was performed following the standard protocol without intravenous contrast. Multiplanar CT image reconstructions of the cervical spine were also generated.  COMPARISON:  None.  04/06/2018 FINDINGS: CT HEAD FINDINGS Brain: Changes of atrophy and chronic microvascular ischemic changes. No signs of intracranial hemorrhage or extra-axial fluid collection. No midline shift. Vascular: No hyperdense vessel or unexpected calcification. Skull: No signs of acute fracture.  Mastoid air cells are clear. Sinuses/Orbits: No air-fluid level.  No signs of sinus disease. Other: Choose 1 CT CERVICAL SPINE FINDINGS Alignment: Alignment is unchanged based on comparison with previous imaging. Accentuation of cervical lordosis is noted, associated with degenerative changes throughout the spine. Postoperative changes are noted at C6-C7 with anterior discectomy and fusion as before. Mild anterolisthesis of C7 on T1 is unchanged as compared to the prior exam. Skull base and vertebrae: No acute fracture. No primary bone lesion or focal pathologic process. Soft tissues and spinal canal: No prevertebral fluid or swelling. No visible canal hematoma. Disc levels: Multilevel degenerative changes as discussed above worse at C4-5, C5-6 and C6-7. Facet arthropathy is redemonstrated at these levels. Fusion again noted at C6-C7. Upper chest: Negative. Other: No signs of soft tissue mass. Streak artifact from anterior cervical hardware. IMPRESSION: Chronic microvascular ischemic changes and generalized  atrophy in the brain without acute intracranial finding. Extensive spinal degenerative changes and signs of anterior cervical discectomy and fusion, with no signs of acute spine fracture or subluxation. Electronically Signed   By: Zetta Bills M.D.   On: 10/02/2019 13:17   Ct Cervical Spine Wo Contrast  Result Date: 10/02/2019 CLINICAL DATA:  Fall, altered mental status. EXAM: CT HEAD WITHOUT CONTRAST CT CERVICAL SPINE WITHOUT CONTRAST TECHNIQUE: Multidetector CT imaging of the head and cervical spine was performed following the standard protocol without intravenous contrast. Multiplanar CT image  reconstructions of the cervical spine were also generated. COMPARISON:  None.  04/06/2018 FINDINGS: CT HEAD FINDINGS Brain: Changes of atrophy and chronic microvascular ischemic changes. No signs of intracranial hemorrhage or extra-axial fluid collection. No midline shift. Vascular: No hyperdense vessel or unexpected calcification. Skull: No signs of acute fracture.  Mastoid air cells are clear. Sinuses/Orbits: No air-fluid level.  No signs of sinus disease. Other: Choose 1 CT CERVICAL SPINE FINDINGS Alignment: Alignment is unchanged based on comparison with previous imaging. Accentuation of cervical lordosis is noted, associated with degenerative changes throughout the spine. Postoperative changes are noted at C6-C7 with anterior discectomy and fusion as before. Mild anterolisthesis of C7 on T1 is unchanged as compared to the prior exam. Skull base and vertebrae: No acute fracture. No primary bone lesion or focal pathologic process. Soft tissues and spinal canal: No prevertebral fluid or swelling. No visible canal hematoma. Disc levels: Multilevel degenerative changes as discussed above worse at C4-5, C5-6 and C6-7. Facet arthropathy is redemonstrated at these levels. Fusion again noted at C6-C7. Upper chest: Negative. Other: No signs of soft tissue mass. Streak artifact from anterior cervical hardware. IMPRESSION: Chronic microvascular ischemic changes and generalized atrophy in the brain without acute intracranial finding. Extensive spinal degenerative changes and signs of anterior cervical discectomy and fusion, with no signs of acute spine fracture or subluxation. Electronically Signed   By: Zetta Bills M.D.   On: 10/02/2019 13:17   Dg Chest Portable 1 View  Result Date: 10/02/2019 CLINICAL DATA:  Found on floor.  Fall. EXAM: PORTABLE CHEST 1 VIEW COMPARISON:  01/28/2019 FINDINGS: Right pacer remains in place, unchanged. Prior CABG. Cardiomegaly cysts with mild vascular congestion. No confluent  opacities, effusions, edema or pneumothorax. No visible acute bony abnormality. IMPRESSION: Cardiomegaly with vascular congestion.  No acute findings. Electronically Signed   By: Rolm Baptise M.D.   On: 10/02/2019 12:35    Procedures Procedures (including critical care time)  Medications Ordered in ED Medications  cefTRIAXone (ROCEPHIN) 1 g in sodium chloride 0.9 % 100 mL IVPB (1 g Intravenous New Bag/Given 10/02/19 1533)  LORazepam (ATIVAN) injection 0.5 mg (0.5 mg Intramuscular Given 10/02/19 1309)  aspirin suppository 300 mg (300 mg Rectal Given 10/02/19 1525)  LORazepam (ATIVAN) injection 1 mg (1 mg Intravenous Given 10/02/19 1534)     Initial Impression / Assessment and Plan / ED Course  I have reviewed the triage vital signs and the nursing notes.  Pertinent labs & imaging results that were available during my care of the patient were reviewed by me and considered in my medical decision making (see chart for details).      Final Clinical Impressions(s) / ED Diagnoses   Final diagnoses:  Elevated troponin  Altered mental status, unspecified altered mental status type   85 y/o female presenting for eval for fall and AMS. Fall unwitnessed and pt does not remember how/why she fell or how long she was on the ground. Hx  limited 2/2 AMS.  CBC with mild leukocytosis, no anemia CMP with mild elevation of total bilirubin and mildly elevated anion gap which appears consistent with baseline Trop initially elevated at 118, delta pending at admission  - unclear if this related to nstemi, will trend and admit CK wnl UA with moderate leukocytes, 21-50 wbcs.   - Potential UTI with acute AMS? Will tx w/ ceftriaxone. Consulted w/ pharmacy who states this is likely safe since she has tolerated ancef.  EKG with v-paced rhythm  CXR with cardiomegaly with vascular congestion. Pt w/o hypoxia or increased wob.   CT head/cervical spine without acute findings.   Will consult cardiology in  regards to elevated trop and plan for admission for trending trop and for acute AMS which is likely secondary to UTI.   2:18 PM CONSULT with Dr. Maryjean Ka with cardiology who recommend admission and trending of troponins. He will consult on the patient during admission.   3:48 PM CONSULT with Dr. Lorin Mercy with hospitalist service who accepts patient for admission.   ED Discharge Orders    None       Rodney Booze, PA-C 10/02/19 [address], Lema Heinkel S, PA-C 10/02/19 1558    Veryl Speak, MD 10/02/19 [phone]

## 2019-10-03 ENCOUNTER — Other Ambulatory Visit: Payer: Self-pay

## 2019-10-03 DIAGNOSIS — I421 Obstructive hypertrophic cardiomyopathy: Secondary | ICD-10-CM | POA: Diagnosis present

## 2019-10-03 DIAGNOSIS — R413 Other amnesia: Secondary | ICD-10-CM | POA: Diagnosis present

## 2019-10-03 DIAGNOSIS — F0390 Unspecified dementia without behavioral disturbance: Secondary | ICD-10-CM | POA: Diagnosis present

## 2019-10-03 DIAGNOSIS — M62838 Other muscle spasm: Secondary | ICD-10-CM | POA: Diagnosis not present

## 2019-10-03 DIAGNOSIS — R519 Headache, unspecified: Secondary | ICD-10-CM | POA: Diagnosis present

## 2019-10-03 DIAGNOSIS — J449 Chronic obstructive pulmonary disease, unspecified: Secondary | ICD-10-CM | POA: Diagnosis present

## 2019-10-03 DIAGNOSIS — Z7901 Long term (current) use of anticoagulants: Secondary | ICD-10-CM | POA: Diagnosis not present

## 2019-10-03 DIAGNOSIS — I499 Cardiac arrhythmia, unspecified: Secondary | ICD-10-CM | POA: Diagnosis not present

## 2019-10-03 DIAGNOSIS — Z950 Presence of cardiac pacemaker: Secondary | ICD-10-CM | POA: Diagnosis not present

## 2019-10-03 DIAGNOSIS — M2550 Pain in unspecified joint: Secondary | ICD-10-CM | POA: Diagnosis not present

## 2019-10-03 DIAGNOSIS — Z853 Personal history of malignant neoplasm of breast: Secondary | ICD-10-CM | POA: Diagnosis not present

## 2019-10-03 DIAGNOSIS — N1831 Chronic kidney disease, stage 3a: Secondary | ICD-10-CM | POA: Diagnosis present

## 2019-10-03 DIAGNOSIS — W19XXXA Unspecified fall, initial encounter: Secondary | ICD-10-CM | POA: Diagnosis not present

## 2019-10-03 DIAGNOSIS — Z66 Do not resuscitate: Secondary | ICD-10-CM | POA: Diagnosis present

## 2019-10-03 DIAGNOSIS — I130 Hypertensive heart and chronic kidney disease with heart failure and stage 1 through stage 4 chronic kidney disease, or unspecified chronic kidney disease: Secondary | ICD-10-CM | POA: Diagnosis present

## 2019-10-03 DIAGNOSIS — R4182 Altered mental status, unspecified: Secondary | ICD-10-CM | POA: Diagnosis not present

## 2019-10-03 DIAGNOSIS — I248 Other forms of acute ischemic heart disease: Secondary | ICD-10-CM | POA: Diagnosis present

## 2019-10-03 DIAGNOSIS — H04123 Dry eye syndrome of bilateral lacrimal glands: Secondary | ICD-10-CM | POA: Diagnosis not present

## 2019-10-03 DIAGNOSIS — I10 Essential (primary) hypertension: Secondary | ICD-10-CM | POA: Diagnosis not present

## 2019-10-03 DIAGNOSIS — S81812A Laceration without foreign body, left lower leg, initial encounter: Secondary | ICD-10-CM | POA: Diagnosis present

## 2019-10-03 DIAGNOSIS — Z20828 Contact with and (suspected) exposure to other viral communicable diseases: Secondary | ICD-10-CM | POA: Diagnosis present

## 2019-10-03 DIAGNOSIS — F05 Delirium due to known physiological condition: Secondary | ICD-10-CM | POA: Diagnosis present

## 2019-10-03 DIAGNOSIS — N189 Chronic kidney disease, unspecified: Secondary | ICD-10-CM | POA: Diagnosis not present

## 2019-10-03 DIAGNOSIS — Z7951 Long term (current) use of inhaled steroids: Secondary | ICD-10-CM | POA: Diagnosis not present

## 2019-10-03 DIAGNOSIS — K5900 Constipation, unspecified: Secondary | ICD-10-CM | POA: Diagnosis not present

## 2019-10-03 DIAGNOSIS — I361 Nonrheumatic tricuspid (valve) insufficiency: Secondary | ICD-10-CM | POA: Diagnosis not present

## 2019-10-03 DIAGNOSIS — E039 Hypothyroidism, unspecified: Secondary | ICD-10-CM | POA: Diagnosis present

## 2019-10-03 DIAGNOSIS — R05 Cough: Secondary | ICD-10-CM | POA: Diagnosis not present

## 2019-10-03 DIAGNOSIS — Z23 Encounter for immunization: Secondary | ICD-10-CM | POA: Diagnosis not present

## 2019-10-03 DIAGNOSIS — R410 Disorientation, unspecified: Secondary | ICD-10-CM | POA: Diagnosis not present

## 2019-10-03 DIAGNOSIS — Z951 Presence of aortocoronary bypass graft: Secondary | ICD-10-CM | POA: Diagnosis not present

## 2019-10-03 DIAGNOSIS — Z800 Family history of malignant neoplasm of digestive organs: Secondary | ICD-10-CM | POA: Diagnosis not present

## 2019-10-03 DIAGNOSIS — I5032 Chronic diastolic (congestive) heart failure: Secondary | ICD-10-CM | POA: Diagnosis present

## 2019-10-03 DIAGNOSIS — I340 Nonrheumatic mitral (valve) insufficiency: Secondary | ICD-10-CM | POA: Diagnosis not present

## 2019-10-03 DIAGNOSIS — R278 Other lack of coordination: Secondary | ICD-10-CM | POA: Diagnosis not present

## 2019-10-03 DIAGNOSIS — G9340 Encephalopathy, unspecified: Secondary | ICD-10-CM | POA: Diagnosis not present

## 2019-10-03 DIAGNOSIS — Z89611 Acquired absence of right leg above knee: Secondary | ICD-10-CM | POA: Diagnosis not present

## 2019-10-03 DIAGNOSIS — R0902 Hypoxemia: Secondary | ICD-10-CM | POA: Diagnosis not present

## 2019-10-03 DIAGNOSIS — Z8249 Family history of ischemic heart disease and other diseases of the circulatory system: Secondary | ICD-10-CM | POA: Diagnosis not present

## 2019-10-03 DIAGNOSIS — G629 Polyneuropathy, unspecified: Secondary | ICD-10-CM | POA: Diagnosis not present

## 2019-10-03 DIAGNOSIS — R0602 Shortness of breath: Secondary | ICD-10-CM | POA: Diagnosis not present

## 2019-10-03 DIAGNOSIS — Z9181 History of falling: Secondary | ICD-10-CM | POA: Diagnosis not present

## 2019-10-03 DIAGNOSIS — R778 Other specified abnormalities of plasma proteins: Secondary | ICD-10-CM | POA: Diagnosis not present

## 2019-10-03 DIAGNOSIS — R262 Difficulty in walking, not elsewhere classified: Secondary | ICD-10-CM | POA: Diagnosis present

## 2019-10-03 DIAGNOSIS — R062 Wheezing: Secondary | ICD-10-CM | POA: Diagnosis not present

## 2019-10-03 DIAGNOSIS — Z89511 Acquired absence of right leg below knee: Secondary | ICD-10-CM | POA: Diagnosis not present

## 2019-10-03 DIAGNOSIS — T7840XD Allergy, unspecified, subsequent encounter: Secondary | ICD-10-CM | POA: Diagnosis not present

## 2019-10-03 DIAGNOSIS — Z7401 Bed confinement status: Secondary | ICD-10-CM | POA: Diagnosis not present

## 2019-10-03 DIAGNOSIS — Z96653 Presence of artificial knee joint, bilateral: Secondary | ICD-10-CM | POA: Diagnosis present

## 2019-10-03 DIAGNOSIS — R52 Pain, unspecified: Secondary | ICD-10-CM | POA: Diagnosis not present

## 2019-10-03 DIAGNOSIS — I480 Paroxysmal atrial fibrillation: Secondary | ICD-10-CM | POA: Diagnosis present

## 2019-10-03 LAB — BASIC METABOLIC PANEL
Anion gap: 16 — ABNORMAL HIGH (ref 5–15)
BUN: 11 mg/dL (ref 8–23)
CO2: 24 mmol/L (ref 22–32)
Calcium: 9.1 mg/dL (ref 8.9–10.3)
Chloride: 101 mmol/L (ref 98–111)
Creatinine, Ser: 0.82 mg/dL (ref 0.44–1.00)
GFR calc Af Amer: >60 mL/min (ref >60)
GFR calc non Af Amer: >60 mL/min (ref >60)
Glucose, Bld: 81 mg/dL (ref 70–99)
Potassium: 3.5 mmol/L (ref 3.5–5.1)
Sodium: 141 mmol/L (ref 135–145)

## 2019-10-03 LAB — URINE CULTURE

## 2019-10-03 LAB — CBC
HCT: 43.2 % (ref 36.0–46.0)
Hemoglobin: 14 g/dL (ref 12.0–15.0)
MCH: 35.5 pg — ABNORMAL HIGH (ref 26.0–34.0)
MCHC: 32.4 g/dL (ref 30.0–36.0)
MCV: 109.6 fL — ABNORMAL HIGH (ref 80.0–100.0)
Platelets: 204 10*3/uL (ref 150–400)
RBC: 3.94 MIL/uL (ref 3.87–5.11)
RDW: 14.1 % (ref 11.5–15.5)
WBC: 8.6 10*3/uL (ref 4.0–10.5)
nRBC: 0 % (ref 0.0–0.2)

## 2019-10-03 LAB — TSH: TSH: 5.976 u[IU]/mL — ABNORMAL HIGH (ref 0.350–4.500)

## 2019-10-03 LAB — SARS CORONAVIRUS 2 (TAT 6-24 HRS): SARS Coronavirus 2: NEGATIVE

## 2019-10-03 LAB — TROPONIN I (HIGH SENSITIVITY): Troponin I (High Sensitivity): 122 ng/L (ref <18)

## 2019-10-03 MED ORDER — LEVOTHYROXINE SODIUM 50 MCG PO TABS
50.0000 ug | ORAL_TABLET | Freq: Every day | ORAL | Status: DC
  Administered 2019-10-04 – 2019-10-08 (×5): 50 ug via ORAL
  Filled 2019-10-03 (×6): qty 1

## 2019-10-03 MED ORDER — INFLUENZA VAC A&B SA ADJ QUAD 0.5 ML IM PRSY
0.5000 mL | PREFILLED_SYRINGE | INTRAMUSCULAR | Status: AC
  Administered 2019-10-04: 0.5 mL via INTRAMUSCULAR
  Filled 2019-10-03: qty 0.5

## 2019-10-03 MED ORDER — GABAPENTIN 100 MG PO CAPS
200.0000 mg | ORAL_CAPSULE | Freq: Every day | ORAL | Status: DC
  Administered 2019-10-04 – 2019-10-07 (×4): 200 mg via ORAL
  Filled 2019-10-03 (×4): qty 2

## 2019-10-03 NOTE — Evaluation (Signed)
Physical Therapy Evaluation Patient Details Name: [name] MRN: [id number] DOB: 15-Oct-1931 Today's Date: 10/03/2019   History of Present Illness  Pt presenting to ED with AMS. PMH -breast CA, rt AKA, pacer, dementia, chf, afib, htn, TDR, hip fx, elbow fx  Clinical Impression  Pt presents to PT with decr in mobility from her baseline and significant cognitive deficits. Recommend initial SNF to get pt back to her baseline with mobility and then she will likely need ALF long term for cognitive deficits. If insurance doesn't allow for SNF would maximize available home services as a bridge to ALF.     Follow Up Recommendations SNF;Supervision/Assistance - 24 hour(initially and likely ALF long term)    Equipment Recommendations  None recommended by PT    Recommendations for Other Services       Precautions / Restrictions Precautions Precautions: Fall      Mobility  Bed Mobility Overal bed mobility: Needs Assistance Bed Mobility: Supine to Sit;Sit to Supine     Supine to sit: Min assist Sit to supine: Min guard   General bed mobility comments: Assist to elevate trunk into sitting  Transfers Overall transfer level: Needs assistance Equipment used: Rolling walker (2 wheeled) Transfers: Sit to/from Stand Sit to Stand: Min assist         General transfer comment: Assist to bring hips up and for balance.   Ambulation/Gait             General Gait Details: Did not attempt. Pt doesn't currently use her prosthetic and uses w/c at home  Stairs            Wheelchair Mobility    Modified Rankin (Stroke Patients Only)       Balance Overall balance assessment: Needs assistance Sitting-balance support: No upper extremity supported;Feet unsupported Sitting balance-Leahy Scale: Good     Standing balance support: Bilateral upper extremity supported Standing balance-Leahy Scale: Poor Standing balance comment: walker and min guard for static standing                              Pertinent Vitals/Pain Pain Assessment: Faces Faces Pain Scale: Hurts little more Pain Location: posterior lt calf with abrasion/skin tear present Pain Descriptors / Indicators: Grimacing;Sore Pain Intervention(s): Limited activity within patient's tolerance;Monitored during session;Repositioned    Home Living Family/patient expects to be discharged to:: Private residence Living Arrangements: Spouse/significant other Available Help at Discharge: Family;Available 24 hours/day Type of Home: House Home Access: Stairs to enter Entrance Stairs-Rails: Right;Left;Can reach both Entrance Stairs-Number of Steps: 6 at front, flight at back door  Home Layout: Two level;Able to live on main level with bedroom/bathroom Home Equipment: Walker - 4 wheels;Bedside commode;Wheelchair - manual;Shower seat Additional Comments: Unsure if info about stairs is accurate. Rt prosthetic that she doesn't wear    Prior Function Level of Independence: Needs assistance   Gait / Transfers Assistance Needed: Modified independent at w/c level            Hand Dominance   Dominant Hand: Left    Extremity/Trunk Assessment   Upper Extremity Assessment Upper Extremity Assessment: Defer to OT evaluation    Lower Extremity Assessment Lower Extremity Assessment: Generalized weakness;RLE deficits/detail RLE Deficits / Details: AKA       Communication   Communication: No difficulties  Cognition Arousal/Alertness: Awake/alert Behavior During Therapy: WFL for tasks assessed/performed Overall Cognitive Status: No family/caregiver present to determine baseline cognitive functioning Area of Impairment: Orientation;Attention;Memory;Following  commands;Safety/judgement;Awareness;Problem solving                 Orientation Level: Disoriented to;Place;Time;Situation Current Attention Level: Sustained Memory: Decreased short-term memory;Decreased recall of  precautions Following Commands: Follows multi-step commands consistently Safety/Judgement: Decreased awareness of safety;Decreased awareness of deficits Awareness: Intellectual Problem Solving: Requires verbal cues General Comments: Pt with history of "memory problems"      General Comments      Exercises     Assessment/Plan    PT Assessment Patient needs continued PT services  PT Problem List Decreased strength;Decreased balance;Decreased mobility;Decreased cognition;Decreased safety awareness       PT Treatment Interventions DME instruction;Gait training;Functional mobility training;Therapeutic activities;Therapeutic exercise;Balance training;Cognitive remediation;Patient/family education;Wheelchair mobility training    PT Goals (Current goals can be found in the Care Plan section)  Acute Rehab PT Goals Patient Stated Goal: go home PT Goal Formulation: Patient unable to participate in goal setting Time For Goal Achievement: 10/17/19 Potential to Achieve Goals: Good    Frequency Min 2X/week   Barriers to discharge Decreased caregiver support husband is elderly    Co-evaluation PT/OT/SLP Co-Evaluation/Treatment: Yes Reason for Co-Treatment: For patient/therapist safety PT goals addressed during session: Mobility/safety with mobility         AM-PAC PT "6 Clicks" Mobility  Outcome Measure Help needed turning from your back to your side while in a flat bed without using bedrails?: A Little Help needed moving from lying on your back to sitting on the side of a flat bed without using bedrails?: A Little Help needed moving to and from a bed to a chair (including a wheelchair)?: A Little Help needed standing up from a chair using your arms (e.g., wheelchair or bedside chair)?: A Little Help needed to walk in hospital room?: A Lot Help needed climbing 3-5 steps with a railing? : Total 6 Click Score: 15    End of Session Equipment Utilized During Treatment: Gait  belt Activity Tolerance: Patient tolerated treatment well Patient left: in bed(stretcher in ED) Nurse Communication: Mobility status PT Visit Diagnosis: Other abnormalities of gait and mobility (R26.89)    Time: [id number] PT Time Calculation (min) (ACUTE ONLY): 24 min   Charges:   PT Evaluation $PT Eval Moderate Complexity: 1 Mod          [place] Pager [phone] Office [place] 10/03/2019, 10:03 AM

## 2019-10-03 NOTE — Therapy (Addendum)
Occupational Therapy Evaluation Patient Details Name: [name] MRN: [id number] DOB: Sep 13, 1931 Today's Date: 10/03/2019    History of Present Illness Pt presenting to ED with AMS. PMH -breast CA, rt AKA, pacer, dementia, chf, afib, htn, TDR, hip fx, elbow fx   Clinical Impression   Pt admitted with above diagnoses, presenting with cognitive deficits and generalized weakness limiting ability to engage in BADL at desired level of ind. Pt is poor historian, home info taken from previous admissions. She is able to complete bed mobility and transfers at min guard- min A with RW. She presents with marked cognitive deficits described below that will impair ability to engage in safe BADL/IADL in the home without 24/7 supervision. At this time recommend short SNF stay to return to PLOF with LTC placement after for consistent supervision. Will continue to follow acutely.     Follow Up Recommendations  SNF;Supervision/Assistance - 24 hour;Other (comment)(SNf initial, will need LTC such as ALF)    Equipment Recommendations  None recommended by OT    Recommendations for Other Services       Precautions / Restrictions Precautions Precautions: Fall Restrictions Weight Bearing Restrictions: No      Mobility Bed Mobility Overal bed mobility: Needs Assistance Bed Mobility: Supine to Sit;Sit to Supine     Supine to sit: Min assist Sit to supine: Min guard   General bed mobility comments: assist for trunk elevation  Transfers Overall transfer level: Needs assistance Equipment used: Rolling walker (2 wheeled) Transfers: Sit to/from Stand Sit to Stand: Min assist         General transfer comment: assist to rise and steady at edge of stretcher    Balance Overall balance assessment: Needs assistance Sitting-balance support: No upper extremity supported Sitting balance-Leahy Scale: Good     Standing balance support: Bilateral upper extremity supported Standing balance-Leahy  Scale: Poor Standing balance comment: reliant on external support                           ADL either performed or assessed with clinical judgement   ADL Overall ADL's : Needs assistance/impaired Eating/Feeding: Set up;Sitting;Bed level   Grooming: Set up;Sitting   Upper Body Bathing: Set up;Sitting   Lower Body Bathing: Minimal assistance;Sitting/lateral leans;Sit to/from stand   Upper Body Dressing : Set up;Sitting   Lower Body Dressing: Minimal assistance;Cueing for safety;Sitting/lateral leans Lower Body Dressing Details (indicate cue type and reason): to don sock siting EOB, safety cues to not lean too far over from stretcher Toilet Transfer: Minimal assistance;Stand-pivot;RW       Tub/ Shower Transfer: Minimal assistance;Stand-pivot;Shower seat;Rolling walker   Functional mobility during ADLs: Minimal assistance;Cueing for safety;Rolling walker General ADL Comments: pt mostly limited 2/2 cognitive deficits and generalized wekaness     Vision   Vision Assessment?: No apparent visual deficits     Perception     Praxis      Pertinent Vitals/Pain Pain Assessment: Faces Faces Pain Scale: Hurts little more Pain Location: post L calf skint tear Pain Descriptors / Indicators: Grimacing;Sore Pain Intervention(s): Limited activity within patient's tolerance;Monitored during session;Repositioned     Hand Dominance Left   Extremity/Trunk Assessment Upper Extremity Assessment Upper Extremity Assessment: Generalized weakness   Lower Extremity Assessment Lower Extremity Assessment: Defer to PT evaluation RLE Deficits / Details: AKA       Communication Communication Communication: No difficulties   Cognition Arousal/Alertness: Awake/alert Behavior During Therapy: WFL for tasks assessed/performed Overall Cognitive Status: No  family/caregiver present to determine baseline cognitive functioning Area of Impairment: Orientation;Attention;Memory;Following  commands;Safety/judgement;Awareness;Problem solving                 Orientation Level: Place;Time;Situation Current Attention Level: Sustained Memory: Decreased recall of precautions;Decreased short-term memory Following Commands: Follows multi-step commands consistently Safety/Judgement: Decreased awareness of safety;Decreased awareness of deficits Awareness: Intellectual Problem Solving: Requires verbal cues General Comments: Pt with hx of memory problems, states that "an acorn fell on her hand and hurt her" and that she did not fall off the toilet she fell in the yard   General Comments  skin tear noted on L posterior calf, RN notified to apply dressing    Exercises     Shoulder Instructions      Home Living Family/patient expects to be discharged to:: Private residence Living Arrangements: Spouse/significant other Available Help at Discharge: Family;Available 24 hours/day Type of Home: House Home Access: Stairs to enter [employer] of Steps: 6 at front; flight at back door Entrance Stairs-Rails: Can reach both Home Layout: Two level;Able to live on main level with bedroom/bathroom Alternate Level Stairs-Number of Steps: flight    Bathroom Shower/Tub: Teacher, early years/pre: Standard     Home Equipment: Environmental consultant - 4 wheels;Bedside commode;Shower seat;Wheelchair - manual   Additional Comments: Pt has prosthetic she does not wear      Prior Functioning/Environment Level of Independence: Needs assistance  Gait / Transfers Assistance Needed: mod I at w/c level per chart. Unsure how safe this was considering cognitive defcitis ADL's / Homemaking Assistance Needed: reports doing all BADL at mod I            OT Problem List: Decreased strength;Decreased activity tolerance;Impaired balance (sitting and/or standing);Decreased cognition;Decreased safety awareness;Decreased knowledge of use of DME or AE      OT Treatment/Interventions:  Self-care/ADL training;Therapeutic exercise;Energy conservation;DME and/or AE instruction;Therapeutic activities;Cognitive remediation/compensation;Patient/family education;Balance training    OT Goals(Current goals can be found in the care plan section) Acute Rehab OT Goals Patient Stated Goal: go home soon OT Goal Formulation: With patient Time For Goal Achievement: 10/17/19 Potential to Achieve Goals: Good  OT Frequency: Min 2X/week   Barriers to D/C:            Co-evaluation PT/OT/SLP Co-Evaluation/Treatment: Yes Reason for Co-Treatment: For patient/therapist safety;To address functional/ADL transfers PT goals addressed during session: Mobility/safety with mobility OT goals addressed during session: ADL's and self-care      AM-PAC OT "6 Clicks" Daily Activity     Outcome Measure Help from another person eating meals?: A Little Help from another person taking care of personal grooming?: A Little Help from another person toileting, which includes using toliet, bedpan, or urinal?: A Little Help from another person bathing (including washing, rinsing, drying)?: A Little Help from another person to put on and taking off regular upper body clothing?: A Little Help from another person to put on and taking off regular lower body clothing?: A Little 6 Click Score: 18   End of Session Equipment Utilized During Treatment: Rolling walker Nurse Communication: Mobility status  Activity Tolerance: Patient tolerated treatment well Patient left: in chair;with call bell/phone within reach;Other (comment)(in stretcher, door open and RN aware)  OT Visit Diagnosis: Unsteadiness on feet (R26.81);Other abnormalities of gait and mobility (R26.89);Muscle weakness (generalized) (M62.81);Other symptoms and signs involving cognitive function                Time: [id number] OT Time Calculation (min): 23 min Charges:  OT General Charges $  OT Visit: 1 Visit OT Evaluation $OT Eval Moderate Complexity:  1 Mod  Zenovia Jarred, MSOT, OTR/L Behavioral Health OT/ Acute Relief OT [facility] Office: [place] 10/03/2019, 11:29 AM

## 2019-10-03 NOTE — Progress Notes (Addendum)
PROGRESS NOTE    JOVONNE WOHLWEND  [id number] DOB: 09/09/1931 DOA: 10/02/2019 PCP: Jani Gravel, MD  Brief Narrative: 85 year old female with history of paroxysmal atrial fibrillation on Eliquis, chronic diastolic CHF, pacemaker, history of breast cancer, left below-knee amputation was brought to the emergency room yesterday following a fall. -Patient reportedly does not does not use her prosthesis and fell while trying to get off the commode yesterday.  In addition spouse also reported worsening cognition and memory over the past several months   Assessment & Plan:  Memory loss, cognitive decline  -Patient's spouse reports ongoing cognitive decline and memory loss over months, slight worsening in the past 2 weeks -No new medications, labs unremarkable-except for mildly elevated high-sensitivity troponin, UA is benign, CT head with chronic atrophy -Exam is nonfocal, cognitive decline noted but otherwise awake alert oriented to self and place -TSH is slightly elevated, will increase Synthroid dose from 25 to 50 mcg -Low-dose Flexeril was discontinued -Decrease gabapentin from 200 mg twice daily to 200 mg QHS -COVID-19 PCR is negative -She lives with her >77 year old spouse -I suspect her presentation and symptoms in the absence of acute triggers is consistent with mild cognitive deficits, early dementia, and what she truly needs is supervision at home or assisted living facility placement -PT OT consulted -Social work, case management consulted -Discharge pending safe disposition  Elevated troponin -No report of chest pain, dyspnea etc. -I think this is nonspecific, no indication of ACS, no further work-up needed unless new symptoms appear -Continue Toprol  Paroxysmal atrial fibrillation -In sinus rhythm at this time, continue Toprol, amiodarone, Eliquis  Hypothyroidism -TSH is high, will increase Synthroid dose from 25 to 50 mcg  HTN -Continue Lisinopril, Toprol  XL  CKD -Stage 3a CKD at baseline, appears stable  Chronic diastolic CHF -[id number] echo with presented EF and grade 2 diastolic dysfunction -Clinically compensated  Afib -Rate control with Amiodarone -Continue Eliquis  COPD -Stable, no wheezing, continue albuterol as needed  DVT prophylaxis:  Lovenox  Code Status:  DNR - confirmed with family Family Communication:  No family at bedside called and discussed with husband Lakesa Harkless Disposition Plan:   To be determined      Procedures:   Antimicrobials:    Subjective: -Feels okay, sitting up in bed, eating breakfast  Objective: Vitals:   10/03/19 0000 10/03/19 0100 10/03/19 0701 10/03/19 1057  BP: 136/65 (!) 157/68  120/63  Pulse: (!) 55 62 60 65  Resp: 17 18    Temp:      TempSrc:      SpO2: 96% 98% 100% 100%   No intake or output data in the 24 hours ending 10/03/19 1154 There were no vitals filed for this visit.  Examination:  General exam: Pleasant elderly female sitting up in bed, eating breakfast, awake alert oriented to self and place only Respiratory system: Clear bilaterally Cardiovascular system: S1 & S2 heard, RRR.  Gastrointestinal system: Abdomen is nondistended, soft and nontender.Normal bowel sounds heard. Central nervous system: Alert and oriented x2, moves all extremities, no localizing signs  extremities: Left BKA Skin: Small skin tear, right lower leg Psychiatry: Poor insight and judgment, mood and affect is appropriate    Data Reviewed:   CBC: Recent Labs  Lab 10/02/19 1311 10/03/19 0413  WBC 11.9* 8.6  NEUTROABS 10.3*  --   HGB 14.9 14.0  HCT 43.9 43.2  MCV 106.6* 109.6*  PLT 223 0000000   Basic Metabolic Panel: Recent Labs  Lab 10/02/19  1311 10/03/19 0413  NA 141 141  K 4.2 3.5  CL 101 101  CO2 23 24  GLUCOSE 101* 81  BUN 12 11  CREATININE 1.00 0.82  CALCIUM 9.7 9.1   GFR: CrCl cannot be calculated (Unknown ideal weight.). Liver Function Tests: Recent Labs   Lab 10/02/19 1311  AST 38  ALT 20  ALKPHOS 53  BILITOT 1.4*  PROT 7.1  ALBUMIN 4.4   No results for input(s): LIPASE, AMYLASE in the last 168 hours. No results for input(s): AMMONIA in the last 168 hours. Coagulation Profile: No results for input(s): INR, PROTIME in the last 168 hours. Cardiac Enzymes: Recent Labs  Lab 10/02/19 1311  CKTOTAL 188   BNP (last 3 results) No results for input(s): PROBNP in the last 8760 hours. HbA1C: No results for input(s): HGBA1C in the last 72 hours. CBG: No results for input(s): GLUCAP in the last 168 hours. Lipid Profile: No results for input(s): CHOL, HDL, LDLCALC, TRIG, CHOLHDL, LDLDIRECT in the last 72 hours. Thyroid Function Tests: Recent Labs    10/03/19 0413  TSH 5.976*   Anemia Panel: No results for input(s): VITAMINB12, FOLATE, FERRITIN, TIBC, IRON, RETICCTPCT in the last 72 hours. Urine analysis:    Component Value Date/Time   COLORURINE YELLOW 10/02/2019 1202   APPEARANCEUR CLEAR 10/02/2019 1202   LABSPEC 1.012 10/02/2019 1202   PHURINE 7.0 10/02/2019 1202   GLUCOSEU NEGATIVE 10/02/2019 1202   HGBUR NEGATIVE 10/02/2019 [place] 10/02/2019 1202   [place] 10/02/2019 1202   PROTEINUR 100 (A) 10/02/2019 1202   UROBILINOGEN 1.0 05/18/2015 0024   NITRITE NEGATIVE 10/02/2019 1202   LEUKOCYTESUR MODERATE (A) 10/02/2019 1202   Sepsis Labs: @LABRCNTIP (procalcitonin:4,lacticidven:4)  ) Recent Results (from the past 240 hour(s))  SARS CORONAVIRUS 2 (TAT 6-24 HRS) Nasopharyngeal Nasopharyngeal Swab     Status: None   Collection Time: 10/02/19  8:29 PM   Specimen: Nasopharyngeal Swab  Result Value Ref Range Status   SARS Coronavirus 2 NEGATIVE NEGATIVE Final    Comment: (NOTE) SARS-CoV-2 target nucleic acids are NOT DETECTED. The SARS-CoV-2 RNA is generally detectable in upper and lower respiratory specimens during the acute phase of infection. Negative results do not preclude SARS-CoV-2  infection, do not rule out co-infections with other pathogens, and should not be used as the sole basis for treatment or other patient management decisions. Negative results must be combined with clinical observations, patient history, and epidemiological information. The expected result is Negative. Fact Sheet for Patients: SugarRoll.be Fact Sheet for Healthcare Providers: https://www.woods-mathews.com/ This test is not yet approved or cleared by the [place] FDA and  has been authorized for detection and/or diagnosis of SARS-CoV-2 by FDA under an Emergency Use Authorization (EUA). This EUA will remain  in effect (meaning this test can be used) for the duration of the COVID-19 declaration under Section 56 4(b)(1) of the Act, 21 U.S.C. section 360bbb-3(b)(1), unless the authorization is terminated or revoked sooner. Performed at [facility], [place] [address], [place], [place] [address]          Radiology Studies: Ct Head Wo Contrast  Result Date: 10/02/2019 CLINICAL DATA:  Fall, altered mental status. EXAM: CT HEAD WITHOUT CONTRAST CT CERVICAL SPINE WITHOUT CONTRAST TECHNIQUE: Multidetector CT imaging of the head and cervical spine was performed following the standard protocol without intravenous contrast. Multiplanar CT image reconstructions of the cervical spine were also generated. COMPARISON:  None.  04/06/2018 FINDINGS: CT HEAD FINDINGS Brain: Changes of atrophy  and chronic microvascular ischemic changes. No signs of intracranial hemorrhage or extra-axial fluid collection. No midline shift. Vascular: No hyperdense vessel or unexpected calcification. Skull: No signs of acute fracture.  Mastoid air cells are clear. Sinuses/Orbits: No air-fluid level.  No signs of sinus disease. Other: Choose 1 CT CERVICAL SPINE FINDINGS Alignment: Alignment is unchanged based on comparison with previous imaging. Accentuation of cervical lordosis is  noted, associated with degenerative changes throughout the spine. Postoperative changes are noted at C6-C7 with anterior discectomy and fusion as before. Mild anterolisthesis of C7 on T1 is unchanged as compared to the prior exam. Skull base and vertebrae: No acute fracture. No primary bone lesion or focal pathologic process. Soft tissues and spinal canal: No prevertebral fluid or swelling. No visible canal hematoma. Disc levels: Multilevel degenerative changes as discussed above worse at C4-5, C5-6 and C6-7. Facet arthropathy is redemonstrated at these levels. Fusion again noted at C6-C7. Upper chest: Negative. Other: No signs of soft tissue mass. Streak artifact from anterior cervical hardware. IMPRESSION: Chronic microvascular ischemic changes and generalized atrophy in the brain without acute intracranial finding. Extensive spinal degenerative changes and signs of anterior cervical discectomy and fusion, with no signs of acute spine fracture or subluxation. Electronically Signed   By: Zetta Bills M.D.   On: 10/02/2019 13:17   Ct Cervical Spine Wo Contrast  Result Date: 10/02/2019 CLINICAL DATA:  Fall, altered mental status. EXAM: CT HEAD WITHOUT CONTRAST CT CERVICAL SPINE WITHOUT CONTRAST TECHNIQUE: Multidetector CT imaging of the head and cervical spine was performed following the standard protocol without intravenous contrast. Multiplanar CT image reconstructions of the cervical spine were also generated. COMPARISON:  None.  04/06/2018 FINDINGS: CT HEAD FINDINGS Brain: Changes of atrophy and chronic microvascular ischemic changes. No signs of intracranial hemorrhage or extra-axial fluid collection. No midline shift. Vascular: No hyperdense vessel or unexpected calcification. Skull: No signs of acute fracture.  Mastoid air cells are clear. Sinuses/Orbits: No air-fluid level.  No signs of sinus disease. Other: Choose 1 CT CERVICAL SPINE FINDINGS Alignment: Alignment is unchanged based on comparison with  previous imaging. Accentuation of cervical lordosis is noted, associated with degenerative changes throughout the spine. Postoperative changes are noted at C6-C7 with anterior discectomy and fusion as before. Mild anterolisthesis of C7 on T1 is unchanged as compared to the prior exam. Skull base and vertebrae: No acute fracture. No primary bone lesion or focal pathologic process. Soft tissues and spinal canal: No prevertebral fluid or swelling. No visible canal hematoma. Disc levels: Multilevel degenerative changes as discussed above worse at C4-5, C5-6 and C6-7. Facet arthropathy is redemonstrated at these levels. Fusion again noted at C6-C7. Upper chest: Negative. Other: No signs of soft tissue mass. Streak artifact from anterior cervical hardware. IMPRESSION: Chronic microvascular ischemic changes and generalized atrophy in the brain without acute intracranial finding. Extensive spinal degenerative changes and signs of anterior cervical discectomy and fusion, with no signs of acute spine fracture or subluxation. Electronically Signed   By: Zetta Bills M.D.   On: 10/02/2019 13:17   Dg Chest Portable 1 View  Result Date: 10/02/2019 CLINICAL DATA:  Found on floor.  Fall. EXAM: PORTABLE CHEST 1 VIEW COMPARISON:  01/28/2019 FINDINGS: Right pacer remains in place, unchanged. Prior CABG. Cardiomegaly cysts with mild vascular congestion. No confluent opacities, effusions, edema or pneumothorax. No visible acute bony abnormality. IMPRESSION: Cardiomegaly with vascular congestion.  No acute findings. Electronically Signed   By: Rolm Baptise M.D.   On: 10/02/2019 12:35  Scheduled Meds:  amiodarone  200 mg Oral Daily   apixaban  2.5 mg Oral Daily   cycloSPORINE  1 drop Both Eyes BID   docusate sodium  100 mg Oral BID   gabapentin  200 mg Oral BID   levothyroxine  25 mcg Oral QHS   lisinopril  10 mg Oral Daily   metoprolol succinate  100 mg Oral Daily   Continuous Infusions:   LOS: 0  days    Time spent: 54min    Domenic Polite, MD [facility]  10/03/2019, 11:54 AM

## 2019-10-04 ENCOUNTER — Inpatient Hospital Stay (HOSPITAL_COMMUNITY): Payer: Medicare Other

## 2019-10-04 DIAGNOSIS — I340 Nonrheumatic mitral (valve) insufficiency: Secondary | ICD-10-CM

## 2019-10-04 DIAGNOSIS — I361 Nonrheumatic tricuspid (valve) insufficiency: Secondary | ICD-10-CM

## 2019-10-04 MED ORDER — APIXABAN 2.5 MG PO TABS
2.5000 mg | ORAL_TABLET | Freq: Two times a day (BID) | ORAL | Status: DC
  Administered 2019-10-04 – 2019-10-08 (×8): 2.5 mg via ORAL
  Filled 2019-10-04 (×8): qty 1

## 2019-10-04 NOTE — TOC Initial Note (Signed)
Transition of Care [facility]) - Initial/Assessment Note    Patient Details  Name: [name] MRN: [id number] Date of Birth: 1931/11/18  Transition [facility]) CM/SW Contact:    Eileen Stanford, LCSW Phone Number: 10/04/2019, 3:09 PM  Clinical Narrative:    Pt only alert to self. CSW spoke with pt's spouse via telephone. Pt's spouse is agreeable to pt going to SNF. Pt has been to [facility] before and pt's spouse would prefer pt go there again. Pt's spouse states it is close to his residence. Pt's spouse pleasant and appreciative. CSW to follow up with SNF.             Expected Discharge Plan: Skilled Nursing Facility Barriers to Discharge: Continued Medical Work up   Patient Goals and CMS Choice Patient states their goals for this hospitalization and ongoing recovery are:: "definitley [facility]" CMS Medicare.gov Compare Post Acute Care list provided to:: Patient Represenative (must comment)(pt's spouse) Choice offered to / list presented to : Spouse  Expected Discharge Plan and Services Expected Discharge Plan: Skilled Nursing Facility In-house Referral: Clinical Social Work Discharge Planning Services: CM Consult Post Acute Care Choice: [place] Living arrangements for the past 2 months: Single Family Home                           HH Arranged: NA          Prior Living Arrangements/Services Living arrangements for the past 2 months: Single Family Home Lives with:: Spouse Patient language and need for interpreter reviewed:: Yes Do you feel safe going back to the place where you live?: Yes      Need for Family Participation in Patient Care: Yes (Comment) Care giver support system in place?: Yes (comment) Current home services: DME Criminal Activity/Legal Involvement Pertinent to Current Situation/Hospitalization: No - Comment as needed  Activities of Daily Living Home Assistive Devices/Equipment: Wheelchair, Eyeglasses, Environmental consultant (specify  type), Dentures (specify type) ADL Screening (condition at time of admission) Patient's cognitive ability adequate to safely complete daily activities?: No Is the patient deaf or have difficulty hearing?: No Does the patient have difficulty seeing, even when wearing glasses/contacts?: No Does the patient have difficulty concentrating, remembering, or making decisions?: Yes Patient able to express need for assistance with ADLs?: Yes Does the patient have difficulty dressing or bathing?: No Independently performs ADLs?: Yes (appropriate for developmental age) Does the patient have difficulty walking or climbing stairs?: Yes Weakness of Legs: Right(RT.AKA) Weakness of Arms/Hands: None  Permission Sought/Granted Permission sought to share information with : Family Supports, Chartered certified accountant granted to share information with : (unable to get consent, pt not alert and oriented, spouse next of kin)  Share Information with NAME: Advaita Montet  Permission granted to share info w AGENCY: [facility]  Permission granted to share info w Relationship: Spouse  Permission granted to share info w Contact Information: [phone]  Emotional Assessment Appearance:: Appears stated age Attitude/Demeanor/Rapport: Unable to Assess Affect (typically observed): Unable to Assess Orientation: : Oriented to Self Alcohol / Substance Use: Not Applicable Psych Involvement: No (comment)  Admission diagnosis:  Elevated troponin [R77.8] Altered mental status, unspecified altered mental status type [R41.82] Patient Active Problem List   Diagnosis Date Noted  . Encephalopathy 10/02/2019  . Elevated troponin 10/02/2019  . AMS (altered mental status) 10/02/2019  . Heart block   . Encounter for care of pacemaker 06/13/2019  . Right carotid bruit 02/18/2019  .  CKD (chronic kidney disease) 01/29/2019  . Hypoglycemia 01/29/2019  . CHF (congestive heart failure) ([place]) 01/28/2019  . Rt  Olecranon fracture 04/07/2018  . Essential hypertension 04/07/2018  . Rt Hip Fracture ([place]) 04/06/2018  . Obstructive lung disease (generalized) ([place]) 10/04/2017  . Humerus fracture 06/09/2016  . Hx of right BKA ([place]) 06/09/2016  . Malnutrition of moderate degree 06/09/2016  . Unilateral AKA ([place]) 09/23/2014  . Heart block AV complete ([place]) 07/03/2013  . Atrial fibrillation ([place]) 07/03/2013  . Breast cancer, left breast ([place]) 04/24/2013  . Other symptoms involving cardiovascular system 12/22/2010  . MITRAL REGURGITATION 06/17/2010  . HOCM (hypertrophic obstructive cardiomyopathy) ([place]) 06/17/2010  . History of heart block 06/17/2010  . HEMORRHOIDS 06/17/2010  . BRONCHITIS 06/17/2010  . ASTHMA 06/17/2010  . CYSTITIS 06/17/2010  . URINARY INCONTINENCE 06/17/2010  . Pacemaker Medtronic Adapta dual-chamber pacemaker 2006, generator change 11/27/2013, programmed to VVI  06/21/2005   PCP:  Jani Gravel, MD Pharmacy:   [facility] [place] [address] Phone: [phone] Fax: [phone]  [facility] [place], [place] [address] [place] [address] Phone: [phone] Fax: [phone]  [facility] [place], [place] [address] Phone: [phone] Fax: [phone]     Social Determinants of Health (SDOH) Interventions    Readmission Risk Interventions No flowsheet data found.

## 2019-10-04 NOTE — NC FL2 (Signed)
[place] LEVEL OF CARE SCREENING TOOL     IDENTIFICATION  Patient Name: [name] Birthdate: 09-Oct-1931 Sex: female Admission Date (Current Location): 10/02/2019  [facility] and [place] Number:  Herbalist and Address:  [facility], [place] [address], [place], [place] [address]      Provider Number: [id number]  Attending Physician Name and Address:  Darliss Cheney, MD  Relative Name and Phone Number:       Current Level of Care: Hospital Recommended Level of Care: [place] Prior Approval Number:    Date Approved/Denied:   PASRR Number: [id number]  Discharge Plan: SNF    Current Diagnoses: Patient Active Problem List   Diagnosis Date Noted  . Encephalopathy 10/02/2019  . Elevated troponin 10/02/2019  . AMS (altered mental status) 10/02/2019  . Heart block   . Encounter for care of pacemaker 06/13/2019  . Right carotid bruit 02/18/2019  . CKD (chronic kidney disease) 01/29/2019  . Hypoglycemia 01/29/2019  . CHF (congestive heart failure) ([place]) 01/28/2019  . Rt Olecranon fracture 04/07/2018  . Essential hypertension 04/07/2018  . Rt Hip Fracture ([place]) 04/06/2018  . Obstructive lung disease (generalized) ([place]) 10/04/2017  . Humerus fracture 06/09/2016  . Hx of right BKA ([place]) 06/09/2016  . Malnutrition of moderate degree 06/09/2016  . Unilateral AKA ([place]) 09/23/2014  . Heart block AV complete ([place]) 07/03/2013  . Atrial fibrillation ([place]) 07/03/2013  . Breast cancer, left breast ([place]) 04/24/2013  . Other symptoms involving cardiovascular system 12/22/2010  . MITRAL REGURGITATION 06/17/2010  . HOCM (hypertrophic obstructive cardiomyopathy) ([place]) 06/17/2010  . History of heart block 06/17/2010  . HEMORRHOIDS 06/17/2010  . BRONCHITIS 06/17/2010  . ASTHMA 06/17/2010  . CYSTITIS 06/17/2010  . URINARY INCONTINENCE 06/17/2010  . Pacemaker Medtronic Adapta dual-chamber pacemaker 2006, generator change  11/27/2013, programmed to VVI  06/21/2005    Orientation RESPIRATION BLADDER Height & Weight     Self  Normal External catheter, Incontinent(placed 10/03/19) Weight: 101 lb 6.6 oz (46 kg) Height:  5\' 3"  (160 cm)  BEHAVIORAL SYMPTOMS/MOOD NEUROLOGICAL BOWEL NUTRITION STATUS      Continent Diet(heart healthy, thin liquids)  AMBULATORY STATUS COMMUNICATION OF NEEDS Skin   Limited Assist Verbally Normal                       Personal Care Assistance Level of Assistance  Bathing, Feeding, Dressing Bathing Assistance: Limited assistance Feeding assistance: Independent Dressing Assistance: Limited assistance     Functional Limitations Info  Sight, Hearing, Speech Sight Info: Adequate Hearing Info: Adequate Speech Info: Adequate    SPECIAL CARE FACTORS FREQUENCY  PT (By licensed PT), OT (By licensed OT)     PT Frequency: 5x OT Frequency: 5x            Contractures Contractures Info: Not present    Additional Factors Info  Code Status, Allergies Code Status Info: DNR Allergies Info: Codeine, Morphine And Related, Sulfa Antibiotics, Ceftaroline           Current Medications (10/04/2019):  This is the current hospital active medication list Current Facility-Administered Medications  Medication Dose Route Frequency Provider Last Rate Last Dose  . acetaminophen (TYLENOL) tablet 650 mg  650 mg Oral Q6H PRN Karmen Bongo, MD   650 mg at 10/04/19 0515   Or  . acetaminophen (TYLENOL) suppository 650 mg  650 mg Rectal Q6H PRN Karmen Bongo, MD      . albuterol (PROVENTIL) (2.5 MG/3ML)  0.083% nebulizer solution 3 mL  3 mL Inhalation Q6H PRN Karmen Bongo, MD      . amiodarone (PACERONE) tablet 200 mg  200 mg Oral Daily Karmen Bongo, MD   200 mg at 10/04/19 1100  . apixaban (ELIQUIS) tablet 2.5 mg  2.5 mg Oral BID Darliss Cheney, MD      . cycloSPORINE (RESTASIS) 0.05 % ophthalmic emulsion 1 drop  1 drop Both Eyes BID Karmen Bongo, MD   1 drop at 10/04/19 1100   . docusate sodium (COLACE) capsule 100 mg  100 mg Oral BID Karmen Bongo, MD   100 mg at 10/04/19 1100  . gabapentin (NEURONTIN) capsule 200 mg  200 mg Oral QHS Domenic Polite, MD      . levothyroxine (SYNTHROID) tablet 50 mcg  50 mcg Oral QHS Domenic Polite, MD   50 mcg at 10/04/19 [phone]  . lisinopril (ZESTRIL) tablet 10 mg  10 mg Oral Daily Karmen Bongo, MD   10 mg at 10/04/19 1101  . metoprolol succinate (TOPROL-XL) 24 hr tablet 100 mg  100 mg Oral Daily Karmen Bongo, MD   100 mg at 10/04/19 1100  . ondansetron (ZOFRAN) tablet 4 mg  4 mg Oral Q6H PRN Karmen Bongo, MD       Or  . ondansetron [facility]) injection 4 mg  4 mg Intravenous Q6H PRN Karmen Bongo, MD      . senna Donavan [place]) tablet 8.6 mg  1 tablet Oral Daily PRN Karmen Bongo, MD         Discharge Medications: Please see discharge summary for a list of discharge medications.  Relevant Imaging Results:  Relevant Lab Results:   Additional Information [id number]  Gerrianne Scale Magie Ciampa, LCSW

## 2019-10-04 NOTE — Progress Notes (Signed)
PROGRESS NOTE    [name]  [id number] DOB: [birth year] DOA: 10/02/2019 PCP: Jani Gravel, MD  Brief Narrative: 85 year old female with history of paroxysmal atrial fibrillation on Eliquis, chronic diastolic CHF, pacemaker, history of breast cancer, left below-knee amputation was brought to the emergency room yesterday following a fall. -Patient reportedly does not does not use her prosthesis and fell while trying to get off the commode yesterday.  In addition spouse also reported worsening cognition and memory over the past several months.  Her troponins were significantly elevated up to 118 here without any chest pain or shortness of breath. Assessment & Plan:  Memory loss, cognitive decline / ?  Dementia/ ?  Delirium -Patient's spouse reports ongoing cognitive decline and memory loss over months, slight worsening in the past 2 weeks -No new medications, labs unremarkable-except for mildly elevated high-sensitivity troponin, UA is benign, CT head with chronic atrophy -Exam is nonfocal, cognitive decline noted but otherwise awake alert oriented to self and place -TSH is slightly elevated, Synthroid increased from 25 to 50 mcg p.o. daily.  Flexeril was discontinued.  Gabapentin was reduced to 200 mg nightly only.  PT OT recommended SNF.  Social worker on board.  She also seems to be having some delirium.  Will try Seroquel 25 mg nightly.  Elevated troponin -No report of chest pain, dyspnea etc. this could be nonspecific however due to significant elevation, this is hard to ignore.  Obtain transthoracic echo.  Paroxysmal atrial fibrillation -In sinus rhythm at this time, continue Toprol, amiodarone, Eliquis  Hypothyroidism -TSH is high, Synthroid increased to 50 mcg.  HTN -Controlled.  Continue Lisinopril, Toprol XL  CKD -Stage 3a CKD at baseline, appears stable  Chronic diastolic CHF -[id number] echo with presented EF and grade 2 diastolic dysfunction -Clinically  compensated  COPD -Stable, no wheezing, continue albuterol as needed  DVT prophylaxis:   Eliquis Code Status:  DNR - confirmed with family Family Communication:  No family at bedside. Disposition Plan:   To be determined      Procedures:   Antimicrobials:    Subjective: Patient seen and examined.  When I saw her, she was trying to get out of the bed.  She removed her bedsheet and was showing me something saying she saw snakes under the bed sheet and on the walls.  She was pleasantly confused.  Objective: Vitals:   10/03/19 1930 10/03/19 2006 10/04/19 0420 10/04/19 0422  BP:  138/76 (!) 151/82   Pulse:  63 66   Resp: (!) 29 (!) 22 20 20   Temp:  98 F (36.7 C) 97.7 F (36.5 C)   TempSrc:  Oral Oral   SpO2:  99% 94%   Weight:    46 kg  Height:        Intake/Output Summary (Last 24 hours) at 10/04/2019 1014 Last data filed at 10/04/2019 0400 Gross per 24 hour  Intake 934.73 ml  Output 500 ml  Net 434.73 ml   Filed Weights   10/03/19 1437 10/04/19 0422  Weight: 46.7 kg 46 kg    Examination:  General exam: Appears calm and comfortable but pleasantly confused Respiratory system: Clear to auscultation. Respiratory effort normal. Cardiovascular system: S1 & S2 heard, RRR. No JVD, murmurs, rubs, gallops or clicks. No pedal edema. Gastrointestinal system: Abdomen is nondistended, soft and nontender. No organomegaly or masses felt. Normal bowel sounds heard. Central nervous system: Alert but pleasantly confused.  No focal neurological deficit. Extremities: Symmetric 5 x 5 power. Skin: No rashes,  lesions or ulcers.  Psychiatry: Judgement and insight appear poor, mood & affect appropriate.   Data Reviewed:   CBC: Recent Labs  Lab 10/02/19 1311 10/03/19 0413  WBC 11.9* 8.6  NEUTROABS 10.3*  --   HGB 14.9 14.0  HCT 43.9 43.2  MCV 106.6* 109.6*  PLT 223 0000000   Basic Metabolic Panel: Recent Labs  Lab 10/02/19 1311 10/03/19 0413  NA 141 141  K 4.2 3.5  CL  101 101  CO2 23 24  GLUCOSE 101* 81  BUN 12 11  CREATININE 1.00 0.82  CALCIUM 9.7 9.1   GFR: Estimated Creatinine Clearance: 35.8 mL/min (by C-G formula based on SCr of 0.82 mg/dL). Liver Function Tests: Recent Labs  Lab 10/02/19 1311  AST 38  ALT 20  ALKPHOS 53  BILITOT 1.4*  PROT 7.1  ALBUMIN 4.4   No results for input(s): LIPASE, AMYLASE in the last 168 hours. No results for input(s): AMMONIA in the last 168 hours. Coagulation Profile: No results for input(s): INR, PROTIME in the last 168 hours. Cardiac Enzymes: Recent Labs  Lab 10/02/19 1311  CKTOTAL 188   BNP (last 3 results) No results for input(s): PROBNP in the last 8760 hours. HbA1C: No results for input(s): HGBA1C in the last 72 hours. CBG: No results for input(s): GLUCAP in the last 168 hours. Lipid Profile: No results for input(s): CHOL, HDL, LDLCALC, TRIG, CHOLHDL, LDLDIRECT in the last 72 hours. Thyroid Function Tests: Recent Labs    10/03/19 0413  TSH 5.976*   Anemia Panel: No results for input(s): VITAMINB12, FOLATE, FERRITIN, TIBC, IRON, RETICCTPCT in the last 72 hours. Urine analysis:    Component Value Date/Time   COLORURINE YELLOW 10/02/2019 1202   APPEARANCEUR CLEAR 10/02/2019 1202   LABSPEC 1.012 10/02/2019 1202   PHURINE 7.0 10/02/2019 1202   GLUCOSEU NEGATIVE 10/02/2019 1202   HGBUR NEGATIVE 10/02/2019 1202   BILIRUBINUR NEGATIVE 10/02/2019 1202   KETONESUR NEGATIVE 10/02/2019 1202   PROTEINUR 100 (A) 10/02/2019 1202   UROBILINOGEN 1.0 05/18/2015 0024   NITRITE NEGATIVE 10/02/2019 1202   LEUKOCYTESUR MODERATE (A) 10/02/2019 1202   Sepsis Labs: @LABRCNTIP (procalcitonin:4,lacticidven:4)  ) Recent Results (from the past 240 hour(s))  Urine culture     Status: Abnormal   Collection Time: 10/02/19  1:11 PM   Specimen: Urine, Random  Result Value Ref Range Status   Specimen Description URINE, RANDOM  Final   Special Requests   Final    NONE Performed at [facility], [place] [address] [place], [place] [address]    Culture MULTIPLE SPECIES PRESENT, SUGGEST RECOLLECTION (A)  Final   Report Status 10/03/2019 FINAL  Final  SARS CORONAVIRUS 2 (TAT 6-24 HRS) Nasopharyngeal Nasopharyngeal Swab     Status: None   Collection Time: 10/02/19  8:29 PM   Specimen: Nasopharyngeal Swab  Result Value Ref Range Status   SARS Coronavirus 2 NEGATIVE NEGATIVE Final    Comment: (NOTE) SARS-CoV-2 target nucleic acids are NOT DETECTED. The SARS-CoV-2 RNA is generally detectable in upper and lower respiratory specimens during the acute phase of infection. Negative results do not preclude SARS-CoV-2 infection, do not rule out co-infections with other pathogens, and should not be used as the sole basis for treatment or other patient management decisions. Negative results must be combined with clinical observations, patient history, and epidemiological information. The expected result is Negative. Fact Sheet for Patients: SugarRoll.be Fact Sheet for Healthcare Providers: https://www.woods-mathews.com/ This test is not yet approved or cleared by the [place]  States FDA and  has been authorized for detection and/or diagnosis of SARS-CoV-2 by FDA under an Emergency Use Authorization (EUA). This EUA will remain  in effect (meaning this test can be used) for the duration of the COVID-19 declaration under Section 56 4(b)(1) of the Act, 21 U.S.C. section 360bbb-3(b)(1), unless the authorization is terminated or revoked sooner. Performed at [facility], [place] [address], [place], [place] [address]      Radiology Studies: Ct Head Wo Contrast  Result Date: 10/02/2019 CLINICAL DATA:  Fall, altered mental status. EXAM: CT HEAD WITHOUT CONTRAST CT CERVICAL SPINE WITHOUT CONTRAST TECHNIQUE: Multidetector CT imaging of the head and cervical spine was performed following the standard protocol without intravenous contrast. Multiplanar CT  image reconstructions of the cervical spine were also generated. COMPARISON:  None.  04/06/2018 FINDINGS: CT HEAD FINDINGS Brain: Changes of atrophy and chronic microvascular ischemic changes. No signs of intracranial hemorrhage or extra-axial fluid collection. No midline shift. Vascular: No hyperdense vessel or unexpected calcification. Skull: No signs of acute fracture.  Mastoid air cells are clear. Sinuses/Orbits: No air-fluid level.  No signs of sinus disease. Other: Choose 1 CT CERVICAL SPINE FINDINGS Alignment: Alignment is unchanged based on comparison with previous imaging. Accentuation of cervical lordosis is noted, associated with degenerative changes throughout the spine. Postoperative changes are noted at C6-C7 with anterior discectomy and fusion as before. Mild anterolisthesis of C7 on T1 is unchanged as compared to the prior exam. Skull base and vertebrae: No acute fracture. No primary bone lesion or focal pathologic process. Soft tissues and spinal canal: No prevertebral fluid or swelling. No visible canal hematoma. Disc levels: Multilevel degenerative changes as discussed above worse at C4-5, C5-6 and C6-7. Facet arthropathy is redemonstrated at these levels. Fusion again noted at C6-C7. Upper chest: Negative. Other: No signs of soft tissue mass. Streak artifact from anterior cervical hardware. IMPRESSION: Chronic microvascular ischemic changes and generalized atrophy in the brain without acute intracranial finding. Extensive spinal degenerative changes and signs of anterior cervical discectomy and fusion, with no signs of acute spine fracture or subluxation. Electronically Signed   By: Zetta Bills M.D.   On: 10/02/2019 13:17   Ct Cervical Spine Wo Contrast  Result Date: 10/02/2019 CLINICAL DATA:  Fall, altered mental status. EXAM: CT HEAD WITHOUT CONTRAST CT CERVICAL SPINE WITHOUT CONTRAST TECHNIQUE: Multidetector CT imaging of the head and cervical spine was performed following the standard  protocol without intravenous contrast. Multiplanar CT image reconstructions of the cervical spine were also generated. COMPARISON:  None.  04/06/2018 FINDINGS: CT HEAD FINDINGS Brain: Changes of atrophy and chronic microvascular ischemic changes. No signs of intracranial hemorrhage or extra-axial fluid collection. No midline shift. Vascular: No hyperdense vessel or unexpected calcification. Skull: No signs of acute fracture.  Mastoid air cells are clear. Sinuses/Orbits: No air-fluid level.  No signs of sinus disease. Other: Choose 1 CT CERVICAL SPINE FINDINGS Alignment: Alignment is unchanged based on comparison with previous imaging. Accentuation of cervical lordosis is noted, associated with degenerative changes throughout the spine. Postoperative changes are noted at C6-C7 with anterior discectomy and fusion as before. Mild anterolisthesis of C7 on T1 is unchanged as compared to the prior exam. Skull base and vertebrae: No acute fracture. No primary bone lesion or focal pathologic process. Soft tissues and spinal canal: No prevertebral fluid or swelling. No visible canal hematoma. Disc levels: Multilevel degenerative changes as discussed above worse at C4-5, C5-6 and C6-7. Facet arthropathy is redemonstrated at these levels. Fusion again  noted at C6-C7. Upper chest: Negative. Other: No signs of soft tissue mass. Streak artifact from anterior cervical hardware. IMPRESSION: Chronic microvascular ischemic changes and generalized atrophy in the brain without acute intracranial finding. Extensive spinal degenerative changes and signs of anterior cervical discectomy and fusion, with no signs of acute spine fracture or subluxation. Electronically Signed   By: Zetta Bills M.D.   On: 10/02/2019 13:17   Dg Chest Portable 1 View  Result Date: 10/02/2019 CLINICAL DATA:  Found on floor.  Fall. EXAM: PORTABLE CHEST 1 VIEW COMPARISON:  01/28/2019 FINDINGS: Right pacer remains in place, unchanged. Prior CABG.  Cardiomegaly cysts with mild vascular congestion. No confluent opacities, effusions, edema or pneumothorax. No visible acute bony abnormality. IMPRESSION: Cardiomegaly with vascular congestion.  No acute findings. Electronically Signed   By: Rolm Baptise M.D.   On: 10/02/2019 12:35    Scheduled Meds:  amiodarone  200 mg Oral Daily   apixaban  2.5 mg Oral Daily   cycloSPORINE  1 drop Both Eyes BID   docusate sodium  100 mg Oral BID   gabapentin  200 mg Oral QHS   influenza vaccine adjuvanted  0.5 mL Intramuscular Tomorrow-1000   levothyroxine  50 mcg Oral QHS   lisinopril  10 mg Oral Daily   metoprolol succinate  100 mg Oral Daily   Continuous Infusions:   LOS: 1 day   Time spent: 31 minutes  Ishmael Holter, MD [facility]  10/04/2019, 10:14 AM

## 2019-10-04 NOTE — Progress Notes (Signed)
  Echocardiogram 2D Echocardiogram has been performed.  [name] 10/04/2019, 2:23 PM

## 2019-10-04 NOTE — Discharge Instructions (Signed)

## 2019-10-05 LAB — BASIC METABOLIC PANEL
Anion gap: 9 (ref 5–15)
BUN: 19 mg/dL (ref 8–23)
CO2: 25 mmol/L (ref 22–32)
Calcium: 8.9 mg/dL (ref 8.9–10.3)
Chloride: 104 mmol/L (ref 98–111)
Creatinine, Ser: 1.01 mg/dL — ABNORMAL HIGH (ref 0.44–1.00)
GFR calc Af Amer: 58 mL/min — ABNORMAL LOW (ref >60)
GFR calc non Af Amer: 50 mL/min — ABNORMAL LOW (ref >60)
Glucose, Bld: 101 mg/dL — ABNORMAL HIGH (ref 70–99)
Potassium: 4.3 mmol/L (ref 3.5–5.1)
Sodium: 138 mmol/L (ref 135–145)

## 2019-10-05 MED ORDER — QUETIAPINE FUMARATE 25 MG PO TABS
25.0000 mg | ORAL_TABLET | Freq: Every day | ORAL | Status: DC
  Administered 2019-10-05 – 2019-10-07 (×3): 25 mg via ORAL
  Filled 2019-10-05 (×3): qty 1

## 2019-10-05 NOTE — TOC Progression Note (Signed)
Transition of Care [facility]) - Progression Note    Patient Details  Name: [name] MRN: [id number] Date of Birth: September 02, 1931  Transition of [facility]) CM/SW [place], LCSW Phone Number: 10/05/2019, 3:41 PM  Clinical Narrative:    CSW called [facility] to ascertain the status of referral. CSW had to leave message.  CSW will continue to follow for discharge planning.    Expected Discharge Plan: [place] Barriers to Discharge: Continued Medical Work up  Expected Discharge Plan and Services Expected Discharge Plan: [place] In-house Referral: Clinical Social Work Discharge Planning Services: CM Consult Post Acute Care Choice: [place] Living arrangements for the past 2 months: Single Family Home                           HH Arranged: NA           Social Determinants of Health (SDOH) Interventions    Readmission Risk Interventions No flowsheet data found.

## 2019-10-05 NOTE — Progress Notes (Signed)
PROGRESS NOTE    [name]  [id number] DOB: March 11, 1931 DOA: 10/02/2019 PCP: Jani Gravel, MD  Brief Narrative: 85 year old female with history of paroxysmal atrial fibrillation on Eliquis, chronic diastolic CHF, pacemaker, history of breast cancer, left below-knee amputation was brought to the emergency room yesterday following a fall. -Patient reportedly does not does not use her prosthesis and fell while trying to get off the commode yesterday.  In addition spouse also reported worsening cognition and memory over the past several months.  Her troponins were significantly elevated up to 118 here without any chest pain or shortness of breath. Assessment & Plan:  Memory loss, cognitive decline / ?  Dementia/ ?  Delirium -Patient's spouse reports ongoing cognitive decline and memory loss over months, slight worsening in the past 2 weeks -No new medications, labs unremarkable-except for mildly elevated high-sensitivity troponin, UA is benign, CT head with chronic atrophy -Exam is nonfocal, cognitive decline noted but otherwise awake alert oriented to self and place -TSH is slightly elevated, Synthroid increased from 25 to 50 mcg p.o. daily.  Flexeril was discontinued.  Gabapentin was reduced to 200 mg nightly only.  PT OT recommended SNF.  Social worker on board.  She also seems to be having some delirium but she is doing better than yesterday.  She is more alert and oriented x3 today.  Will try Seroquel 25 mg nightly.  Elevated troponin -No report of chest pain, dyspnea etc. this could be nonspecific however due to significant elevation, this is hard to ignore.  Obtain transthoracic echo.  Paroxysmal atrial fibrillation -In sinus rhythm at this time, continue Toprol, amiodarone, Eliquis  Hypothyroidism -TSH is high, Synthroid increased to 50 mcg.  HTN -Controlled.  Continue Lisinopril, Toprol XL  CKD -Stage 3a CKD at baseline, appears stable  Chronic diastolic CHF -[id number] echo  with presented EF and grade 2 diastolic dysfunction -Clinically compensated  COPD -Stable, no wheezing, continue albuterol as needed  DVT prophylaxis:   Eliquis Code Status:  DNR - confirmed with family Family Communication:  No family at bedside. Disposition Plan:   To be determined      Procedures:   Antimicrobials:    Subjective: Seen and examined.  Doing better than yesterday.  More alert and oriented x3 today.  Denied any complaint.  Objective: Vitals:   10/04/19 1651 10/04/19 1939 10/05/19 0521 10/05/19 0536  BP: (!) 145/72 (!) 158/87  127/69  Pulse: 79 63  63  Resp: 19 16  18   Temp: 98.6 F (37 C) 98 F (36.7 C)  (!) 97.4 F (36.3 C)  TempSrc: Oral Oral  Oral  SpO2: 97% 97%  95%  Weight:   46 kg   Height:        Intake/Output Summary (Last 24 hours) at 10/05/2019 0844 Last data filed at 10/05/2019 0521 Gross per 24 hour  Intake -  Output 700 ml  Net -700 ml   Filed Weights   10/03/19 1437 10/04/19 0422 10/05/19 0521  Weight: 46.7 kg 46 kg 46 kg    Examination:  General exam: Appears calm and comfortable  Respiratory system: Clear to auscultation. Respiratory effort normal. Cardiovascular system: S1 & S2 heard, RRR. No JVD, murmurs, rubs, gallops or clicks. No pedal edema. Gastrointestinal system: Abdomen is nondistended, soft and nontender. No organomegaly or masses felt. Normal bowel sounds heard. Central nervous system: Alert and oriented x3. No focal neurological deficits. Extremities: Symmetric 5 x 5 power. Skin: No rashes, lesions or ulcers.  Psychiatry: Judgement and  insight appear poor. Mood & affect appropriate.  Data Reviewed:   CBC: Recent Labs  Lab 10/02/19 1311 10/03/19 0413  WBC 11.9* 8.6  NEUTROABS 10.3*  --   HGB 14.9 14.0  HCT 43.9 43.2  MCV 106.6* 109.6*  PLT 223 0000000   Basic Metabolic Panel: Recent Labs  Lab 10/02/19 1311 10/03/19 0413  NA 141 141  K 4.2 3.5  CL 101 101  CO2 23 24  GLUCOSE 101* 81  BUN 12 11   CREATININE 1.00 0.82  CALCIUM 9.7 9.1   GFR: Estimated Creatinine Clearance: 35.8 mL/min (by C-G formula based on SCr of 0.82 mg/dL). Liver Function Tests: Recent Labs  Lab 10/02/19 1311  AST 38  ALT 20  ALKPHOS 53  BILITOT 1.4*  PROT 7.1  ALBUMIN 4.4   No results for input(s): LIPASE, AMYLASE in the last 168 hours. No results for input(s): AMMONIA in the last 168 hours. Coagulation Profile: No results for input(s): INR, PROTIME in the last 168 hours. Cardiac Enzymes: Recent Labs  Lab 10/02/19 1311  CKTOTAL 188   BNP (last 3 results) No results for input(s): PROBNP in the last 8760 hours. HbA1C: No results for input(s): HGBA1C in the last 72 hours. CBG: No results for input(s): GLUCAP in the last 168 hours. Lipid Profile: No results for input(s): CHOL, HDL, LDLCALC, TRIG, CHOLHDL, LDLDIRECT in the last 72 hours. Thyroid Function Tests: Recent Labs    10/03/19 0413  TSH 5.976*   Anemia Panel: No results for input(s): VITAMINB12, FOLATE, FERRITIN, TIBC, IRON, RETICCTPCT in the last 72 hours. Urine analysis:    Component Value Date/Time   COLORURINE YELLOW 10/02/2019 1202   APPEARANCEUR CLEAR 10/02/2019 1202   LABSPEC 1.012 10/02/2019 1202   PHURINE 7.0 10/02/2019 1202   GLUCOSEU NEGATIVE 10/02/2019 1202   HGBUR NEGATIVE 10/02/2019 [place] 10/02/2019 1202   KETONESUR NEGATIVE 10/02/2019 1202   PROTEINUR 100 (A) 10/02/2019 1202   UROBILINOGEN 1.0 05/18/2015 0024   NITRITE NEGATIVE 10/02/2019 1202   LEUKOCYTESUR MODERATE (A) 10/02/2019 1202   Sepsis Labs: @LABRCNTIP (procalcitonin:4,lacticidven:4)  ) Recent Results (from the past 240 hour(s))  Urine culture     Status: Abnormal   Collection Time: 10/02/19  1:11 PM   Specimen: Urine, Random  Result Value Ref Range Status   Specimen Description URINE, RANDOM  Final   Special Requests   Final    NONE Performed at [facility], [place] [address], [place], [place] [address]     Culture MULTIPLE SPECIES PRESENT, SUGGEST RECOLLECTION (A)  Final   Report Status 10/03/2019 FINAL  Final  SARS CORONAVIRUS 2 (TAT 6-24 HRS) Nasopharyngeal Nasopharyngeal Swab     Status: None   Collection Time: 10/02/19  8:29 PM   Specimen: Nasopharyngeal Swab  Result Value Ref Range Status   SARS Coronavirus 2 NEGATIVE NEGATIVE Final    Comment: (NOTE) SARS-CoV-2 target nucleic acids are NOT DETECTED. The SARS-CoV-2 RNA is generally detectable in upper and lower respiratory specimens during the acute phase of infection. Negative results do not preclude SARS-CoV-2 infection, do not rule out co-infections with other pathogens, and should not be used as the sole basis for treatment or other patient management decisions. Negative results must be combined with clinical observations, patient history, and epidemiological information. The expected result is Negative. Fact Sheet for Patients: SugarRoll.be Fact Sheet for Healthcare Providers: https://www.woods-mathews.com/ This test is not yet approved or cleared by the [place] FDA and  has been authorized for  detection and/or diagnosis of SARS-CoV-2 by FDA under an Emergency Use Authorization (EUA). This EUA will remain  in effect (meaning this test can be used) for the duration of the COVID-19 declaration under Section 56 4(b)(1) of the Act, 21 U.S.C. section 360bbb-3(b)(1), unless the authorization is terminated or revoked sooner. Performed at [facility], [place] [address] [place], [place] [address]      Radiology Studies: No results found.  Scheduled Meds: . amiodarone  200 mg Oral Daily  . apixaban  2.5 mg Oral BID  . cycloSPORINE  1 drop Both Eyes BID  . docusate sodium  100 mg Oral BID  . gabapentin  200 mg Oral QHS  . levothyroxine  50 mcg Oral QHS  . lisinopril  10 mg Oral Daily  . metoprolol succinate  100 mg Oral Daily   Continuous Infusions:   LOS: 2 days    Time spent: 25 minutes  Ishmael Holter, MD [facility]  10/05/2019, 8:44 AM

## 2019-10-06 LAB — BASIC METABOLIC PANEL
Anion gap: 10 (ref 5–15)
BUN: 20 mg/dL (ref 8–23)
CO2: 24 mmol/L (ref 22–32)
Calcium: 9.1 mg/dL (ref 8.9–10.3)
Chloride: 109 mmol/L (ref 98–111)
Creatinine, Ser: 0.97 mg/dL (ref 0.44–1.00)
GFR calc Af Amer: >60 mL/min (ref >60)
GFR calc non Af Amer: 53 mL/min — ABNORMAL LOW (ref >60)
Glucose, Bld: 86 mg/dL (ref 70–99)
Potassium: 4 mmol/L (ref 3.5–5.1)
Sodium: 143 mmol/L (ref 135–145)

## 2019-10-06 LAB — CBC WITH DIFFERENTIAL/PLATELET
Abs Immature Granulocytes: 0.02 10*3/uL (ref 0.00–0.07)
Basophils Absolute: 0.1 10*3/uL (ref 0.0–0.1)
Basophils Relative: 1 %
Eosinophils Absolute: 0.1 10*3/uL (ref 0.0–0.5)
Eosinophils Relative: 2 %
HCT: 41.5 % (ref 36.0–46.0)
Hemoglobin: 14.3 g/dL (ref 12.0–15.0)
Immature Granulocytes: 0 %
Lymphocytes Relative: 10 %
Lymphs Abs: 0.7 10*3/uL (ref 0.7–4.0)
MCH: 36.5 pg — ABNORMAL HIGH (ref 26.0–34.0)
MCHC: 34.5 g/dL (ref 30.0–36.0)
MCV: 105.9 fL — ABNORMAL HIGH (ref 80.0–100.0)
Monocytes Absolute: 0.8 10*3/uL (ref 0.1–1.0)
Monocytes Relative: 12 %
Neutro Abs: 5.3 10*3/uL (ref 1.7–7.7)
Neutrophils Relative %: 75 %
Platelets: 203 10*3/uL (ref 150–400)
RBC: 3.92 MIL/uL (ref 3.87–5.11)
RDW: 14.2 % (ref 11.5–15.5)
WBC: 7 10*3/uL (ref 4.0–10.5)
nRBC: 0 % (ref 0.0–0.2)

## 2019-10-06 NOTE — Progress Notes (Signed)
PROGRESS NOTE    [name]  [id number] DOB: [birth year] DOA: 10/02/2019 PCP: Jani Gravel, MD  Brief Narrative: 85 year old female with history of paroxysmal atrial fibrillation on Eliquis, chronic diastolic CHF, pacemaker, history of breast cancer, left below-knee amputation was brought to the emergency room yesterday following a fall. -Patient reportedly does not does not use her prosthesis and fell while trying to get off the commode yesterday.  In addition spouse also reported worsening cognition and memory over the past several months.  Her troponins were significantly elevated up to 118 here without any chest pain or shortness of breath.  Assessment & Plan:  Memory loss, cognitive decline / ?  Dementia/ ?  Delirium -Patient's spouse reports ongoing cognitive decline and memory loss over months, slight worsening in the past 2 weeks -No new medications, labs unremarkable-except for mildly elevated high-sensitivity troponin, UA is benign, CT head with chronic atrophy -Exam is nonfocal, cognitive decline noted but otherwise awake alert oriented to self and place -TSH is slightly elevated, Synthroid increased from 25 to 50 mcg p.o. daily.  Flexeril was discontinued.  Gabapentin was reduced to 200 mg nightly only.  PT OT recommended SNF.  Social worker on board.  I was informed that she has a place at [facility] but she needs negative cover test.  Have ordered 1 today.  She was little sleepy but oriented x4.  Continue Seroquel 25 mg nightly in the meantime.    Elevated troponin -No report of chest pain, dyspnea etc. this could be nonspecific however due to significant elevation, this is hard to ignore.  Obtain transthoracic echo.  Paroxysmal atrial fibrillation -In sinus rhythm at this time, continue Toprol, amiodarone, Eliquis  Hypothyroidism -TSH is high, Synthroid increased to 50 mcg.  HTN -Controlled.  Continue Lisinopril, Toprol XL  CKD -Stage 3a CKD at baseline, appears  stable  Chronic diastolic CHF -[id number] echo with presented EF and grade 2 diastolic dysfunction -Clinically compensated  COPD -Stable, no wheezing, continue albuterol as needed  DVT prophylaxis:   Eliquis Code Status:  DNR - confirmed with family Family Communication:  No family at bedside. Disposition Plan:   To be determined      Procedures:   Antimicrobials:    Subjective: Patient seen and examined.  She was little sleepy.  Oriented x4.  No other complaint.  Objective: Vitals:   10/05/19 2243 10/06/19 0004 10/06/19 0623 10/06/19 1126  BP: (!) 146/98 (!) 143/65 139/73 134/70  Pulse: (!) 59 (!) 59 63 60  Resp: 18 16 16    Temp: 97.6 F (36.4 C) 97.6 F (36.4 C) 98 F (36.7 C)   TempSrc: Oral Oral Oral   SpO2: 96% 99% 99%   Weight:   47 kg   Height:        Intake/Output Summary (Last 24 hours) at 10/06/2019 1229 Last data filed at 10/05/2019 2300 Gross per 24 hour  Intake 120 ml  Output 400 ml  Net -280 ml   Filed Weights   10/04/19 0422 10/05/19 0521 10/06/19 0623  Weight: 46 kg 46 kg 47 kg    Examination:  General exam: Sleepy Respiratory system: Clear to auscultation. Respiratory effort normal. Cardiovascular system: S1 & S2 heard, RRR. No JVD, murmurs, rubs, gallops or clicks. No pedal edema. Gastrointestinal system: Abdomen is nondistended, soft and nontender. No organomegaly or masses felt. Normal bowel sounds heard. Central nervous system: Sleepy and oriented x4. No focal neurological deficits. Extremities: Symmetric 5 x 5 power. Skin: No rashes, lesions or  ulcers.  Psychiatry: Judgement and insight appear poor. Mood & affect flat.  Data Reviewed:   CBC: Recent Labs  Lab 10/02/19 1311 10/03/19 0413 10/06/19 0800  WBC 11.9* 8.6 7.0  NEUTROABS 10.3*  --  5.3  HGB 14.9 14.0 14.3  HCT 43.9 43.2 41.5  MCV 106.6* 109.6* 105.9*  PLT 223 204 [id number]   Basic Metabolic Panel: Recent Labs  Lab 10/02/19 1311 10/03/19 0413 10/05/19 1123 10/06/19  0800  NA 141 141 138 143  K 4.2 3.5 4.3 4.0  CL 101 101 104 109  CO2 23 24 25 24   GLUCOSE 101* 81 101* 86  BUN 12 11 19 20   CREATININE 1.00 0.82 1.01* 0.97  CALCIUM 9.7 9.1 8.9 9.1   GFR: Estimated Creatinine Clearance: 30.9 mL/min (by C-G formula based on SCr of 0.97 mg/dL). Liver Function Tests: Recent Labs  Lab 10/02/19 1311  AST 38  ALT 20  ALKPHOS 53  BILITOT 1.4*  PROT 7.1  ALBUMIN 4.4   No results for input(s): LIPASE, AMYLASE in the last 168 hours. No results for input(s): AMMONIA in the last 168 hours. Coagulation Profile: No results for input(s): INR, PROTIME in the last 168 hours. Cardiac Enzymes: Recent Labs  Lab 10/02/19 1311  CKTOTAL 188   BNP (last 3 results) No results for input(s): PROBNP in the last 8760 hours. HbA1C: No results for input(s): HGBA1C in the last 72 hours. CBG: No results for input(s): GLUCAP in the last 168 hours. Lipid Profile: No results for input(s): CHOL, HDL, LDLCALC, TRIG, CHOLHDL, LDLDIRECT in the last 72 hours. Thyroid Function Tests: No results for input(s): TSH, T4TOTAL, FREET4, T3FREE, THYROIDAB in the last 72 hours. Anemia Panel: No results for input(s): VITAMINB12, FOLATE, FERRITIN, TIBC, IRON, RETICCTPCT in the last 72 hours. Urine analysis:    Component Value Date/Time   COLORURINE YELLOW 10/02/2019 1202   APPEARANCEUR CLEAR 10/02/2019 1202   LABSPEC 1.012 10/02/2019 1202   PHURINE 7.0 10/02/2019 1202   GLUCOSEU NEGATIVE 10/02/2019 1202   HGBUR NEGATIVE 10/02/2019 [place] 10/02/2019 1202   KETONESUR NEGATIVE 10/02/2019 1202   PROTEINUR 100 (A) 10/02/2019 1202   UROBILINOGEN 1.0 05/18/2015 0024   NITRITE NEGATIVE 10/02/2019 1202   LEUKOCYTESUR MODERATE (A) 10/02/2019 1202   Sepsis Labs: @LABRCNTIP (procalcitonin:4,lacticidven:4)  ) Recent Results (from the past 240 hour(s))  Urine culture     Status: Abnormal   Collection Time: 10/02/19  1:11 PM   Specimen: Urine, Random  Result  Value Ref Range Status   Specimen Description URINE, RANDOM  Final   Special Requests   Final    NONE Performed at [facility], [place] [address] [place], [place] [address]    Culture MULTIPLE SPECIES PRESENT, SUGGEST RECOLLECTION (A)  Final   Report Status 10/03/2019 FINAL  Final  SARS CORONAVIRUS 2 (TAT 6-24 HRS) Nasopharyngeal Nasopharyngeal Swab     Status: None   Collection Time: 10/02/19  8:29 PM   Specimen: Nasopharyngeal Swab  Result Value Ref Range Status   SARS Coronavirus 2 NEGATIVE NEGATIVE Final    Comment: (NOTE) SARS-CoV-2 target nucleic acids are NOT DETECTED. The SARS-CoV-2 RNA is generally detectable in upper and lower respiratory specimens during the acute phase of infection. Negative results do not preclude SARS-CoV-2 infection, do not rule out co-infections with other pathogens, and should not be used as the sole basis for treatment or other patient management decisions. Negative results must be combined with clinical observations, patient history, and epidemiological information.  The expected result is Negative. Fact Sheet for Patients: SugarRoll.be Fact Sheet for Healthcare Providers: https://www.woods-mathews.com/ This test is not yet approved or cleared by the [place] FDA and  has been authorized for detection and/or diagnosis of SARS-CoV-2 by FDA under an Emergency Use Authorization (EUA). This EUA will remain  in effect (meaning this test can be used) for the duration of the COVID-19 declaration under Section 56 4(b)(1) of the Act, 21 U.S.C. section 360bbb-3(b)(1), unless the authorization is terminated or revoked sooner. Performed at [facility], [place] [address], [place], [place] [address]      Radiology Studies: No results found.  Scheduled Meds: . amiodarone  200 mg Oral Daily  . apixaban  2.5 mg Oral BID  . cycloSPORINE  1 drop Both Eyes BID  . docusate sodium  100 mg Oral BID  .  gabapentin  200 mg Oral QHS  . levothyroxine  50 mcg Oral QHS  . lisinopril  10 mg Oral Daily  . metoprolol succinate  100 mg Oral Daily  . QUEtiapine  25 mg Oral QHS   Continuous Infusions:   LOS: 3 days   Time spent: 26 minutes  Ishmael Holter, MD [facility]  10/06/2019, 12:29 PM

## 2019-10-06 NOTE — TOC Progression Note (Addendum)
Transition of Care [facility]) - Progression Note    Patient Details  Name: [name] MRN: [id number] Date of Birth: 1931/02/14  [facility] CM/SW [place], LCSW Phone Number: 10/06/2019, 9:24 AM  Clinical Narrative:     CSW spoke with patient's spouse to alerted him that patient has been accepted at [facility]. Patient's spouse has selected the facility.  CSW followed up with April at [facility] and confirmed bed offer. April stated that patient will need regular COVID test within 48 hours. They will not accept rapid.  TOC team will continue to follow for discharge planning.   Expected Discharge Plan: [place] Barriers to Discharge: Continued Medical Work up  Expected Discharge Plan and Services Expected Discharge Plan: [place] In-house Referral: Clinical Social Work Discharge Planning Services: CM Consult Post Acute Care Choice: [place] Living arrangements for the past 2 months: Single Family Home                           HH Arranged: NA           Social Determinants of Health (SDOH) Interventions    Readmission Risk Interventions No flowsheet data found.

## 2019-10-07 LAB — NOVEL CORONAVIRUS, NAA (HOSP ORDER, SEND-OUT TO REF LAB; TAT 18-24 HRS): SARS-CoV-2, NAA: NOT DETECTED

## 2019-10-07 MED ORDER — LEVOTHYROXINE SODIUM 50 MCG PO TABS: 50.0000 ug | ORAL_TABLET | Freq: Every day | ORAL | 0 refills | Status: DC

## 2019-10-07 MED ORDER — CYCLOBENZAPRINE HCL 5 MG PO TABS: 5.0000 mg | ORAL_TABLET | Freq: Every day | ORAL | 0 refills | Status: DC | PRN

## 2019-10-07 MED ORDER — TRAMADOL HCL 50 MG PO TABS: 50.0000 mg | ORAL_TABLET | ORAL | 0 refills | Status: DC | PRN

## 2019-10-07 NOTE — Progress Notes (Signed)
Physical Therapy Treatment Patient Details Name: [name] MRN: [id number] DOB: May 19, 1931 Today's Date: 10/07/2019    History of Present Illness Pt presenting to ED with AMS. PMH -breast CA, rt AKA, pacer, dementia, chf, afib, htn, TDR, hip fx, elbow fx    PT Comments    Pt was up to side of bed with min assist, stands from higher bed with min assist but needs help to sequence properly.  Pt is very motivated, expecting to go home eventually with family assist.  Pt did there ex with cues, and will need to be sequenced and corrected for safety with all movement on walker and all ex's for technique.  Her plan continues to be SNF as she has stairs at home and is not currently able to use prosthesis with PT due to being at home.  Per pt did use walker to ambulate, so should have potential to accomplish this.  Follow acutely for these needs.  Follow Up Recommendations  SNF     Equipment Recommendations  None recommended by PT    Recommendations for Other Services       Precautions / Restrictions Precautions Precautions: Fall Restrictions Weight Bearing Restrictions: No    Mobility  Bed Mobility Overal bed mobility: Needs Assistance Bed Mobility: Supine to Sit;Sit to Supine     Supine to sit: Min assist Sit to supine: Min assist   General bed mobility comments: min for trunk then pt can complete scooting with extra time  Transfers Overall transfer level: Needs assistance Equipment used: Rolling walker (2 wheeled) Transfers: Sit to/from Stand Sit to Stand: Min assist;From elevated surface         General transfer comment: cues needed for hand placement every trial  Ambulation/Gait             General Gait Details: not attempted   Stairs             Wheelchair Mobility    Modified Rankin (Stroke Patients Only)       Balance Overall balance assessment: Needs assistance Sitting-balance support: Single extremity supported;Bilateral upper  extremity supported Sitting balance-Leahy Scale: Good     Standing balance support: Bilateral upper extremity supported;During functional activity Standing balance-Leahy Scale: Poor Standing balance comment: fair with walker but fatigues                            Cognition Arousal/Alertness: Awake/alert Behavior During Therapy: WFL for tasks assessed/performed Overall Cognitive Status: No family/caregiver present to determine baseline cognitive functioning Area of Impairment: Problem solving;Awareness;Safety/judgement;Following commands                   Current Attention Level: Selective Memory: Decreased recall of precautions;Decreased short-term memory Following Commands: Follows one step commands inconsistently Safety/Judgement: Decreased awareness of safety;Decreased awareness of deficits Awareness: Intellectual Problem Solving: Requires verbal cues;Decreased initiation General Comments: limited recall of history per notes      Exercises General Exercises - Lower Extremity Ankle Circles/Pumps: AROM;Left Quad Sets: AROM;Both;10 reps Gluteal Sets: AROM;Both;10 reps Heel Slides: AROM;Both;10 reps Hip ABduction/ADduction: AROM;Both;10 reps    General Comments General comments (skin integrity, edema, etc.): pt was able to do toe raises with walker on LLE and postural correction in standing was done as well      Pertinent Vitals/Pain Pain Assessment: No/denies pain    Home Living  Prior Function            PT Goals (current goals can now be found in the care plan section) Acute Rehab PT Goals Patient Stated Goal: go home soon Progress towards PT goals: Progressing toward goals    Frequency    Min 2X/week      PT Plan Current plan remains appropriate    Co-evaluation              AM-PAC PT "6 Clicks" Mobility   Outcome Measure  Help needed turning from your back to your side while in a flat bed  without using bedrails?: A Little Help needed moving from lying on your back to sitting on the side of a flat bed without using bedrails?: A Little Help needed moving to and from a bed to a chair (including a wheelchair)?: A Little Help needed standing up from a chair using your arms (e.g., wheelchair or bedside chair)?: A Little Help needed to walk in hospital room?: A Lot Help needed climbing 3-5 steps with a railing? : A Lot 6 Click Score: 16    End of Session Equipment Utilized During Treatment: Gait belt Activity Tolerance: Patient tolerated treatment well Patient left: in bed Nurse Communication: Mobility status PT Visit Diagnosis: Other abnormalities of gait and mobility (R26.89)     Time: 1020-1049 PT Time Calculation (min) (ACUTE ONLY): 29 min  Charges:  $Therapeutic Exercise: 8-22 mins $Therapeutic Activity: 8-22 mins                    Ramond Dial 10/07/2019, 1:56 PM   Mee Hives, PT MS Acute Rehab Dept. Number: [place]

## 2019-10-07 NOTE — Plan of Care (Signed)
  Problem: Clinical Measurements: Goal: Respiratory complications will improve Outcome: Progressing Goal: Cardiovascular complication will be avoided Outcome: Progressing   Problem: Pain Managment: Goal: General experience of comfort will improve Outcome: Progressing

## 2019-10-07 NOTE — Care Management Important Message (Signed)
Important Message  Patient Details  Name: [name] MRN: [id number] Date of Birth: 01-03-1931   Medicare Important Message Given:  Yes     Shelda Altes 10/07/2019, 1:36 PM

## 2019-10-07 NOTE — Progress Notes (Signed)
2nd shift CSW in contact with [facility] to confirm that patient could discharge to facility on this evening via PTAR. Administrator expressed that they were not expecting her until tomorrow evening due to awaiting COVID test results. Patient is now scheduled to discharge on tomorrow to [place] Worker  Ph: [phone]

## 2019-10-07 NOTE — Progress Notes (Signed)
PROGRESS NOTE    [name]  [id number] DOB: Feb 09, 1931 DOA: 10/02/2019 PCP: Jani Gravel, MD  Brief Narrative: 85 year old female with history of paroxysmal atrial fibrillation on Eliquis, chronic diastolic CHF, pacemaker, history of breast cancer, left below-knee amputation was brought to the emergency room yesterday following a fall. -Patient reportedly does not does not use her prosthesis and fell while trying to get off the commode yesterday.  In addition spouse also reported worsening cognition and memory over the past several months.  Her troponins were significantly elevated up to 118 here without any chest pain or shortness of breath.  Assessment & Plan:  Memory loss, cognitive decline / ?  Dementia/ ?  Delirium -Patient's spouse reports ongoing cognitive decline and memory loss over months, slight worsening in the past 2 weeks -No new medications, labs unremarkable-except for mildly elevated high-sensitivity troponin, UA is benign, CT head with chronic atrophy -Exam is nonfocal, cognitive decline noted but otherwise awake alert oriented to self and place -TSH is slightly elevated, Synthroid increased from 25 to 50 mcg p.o. daily.  Flexeril was discontinued.  Gabapentin was reduced to 200 mg nightly only.  PT OT recommended SNF.  Social worker on board.  I was informed that she has a place at [facility] but she needs negative covid test.  The test has been ordered from 10/06/2019 however results are still pending.  She is completely alert and oriented today.  Discharge orders are completed pending negative Covid test.  Continue Seroquel tonight if she happens to stay overnight.  Elevated troponin -No report of chest pain, dyspnea etc. this could be nonspecific however due to significant elevation, this is hard to ignore.  Obtain transthoracic echo.  Paroxysmal atrial fibrillation -In sinus rhythm at this time, continue Toprol, amiodarone, Eliquis  Hypothyroidism -TSH is high,  Synthroid increased to 50 mcg.  HTN -Controlled.  Continue Lisinopril, Toprol XL  CKD -Stage 3a CKD at baseline, appears stable  Chronic diastolic CHF -[id number] echo with presented EF and grade 2 diastolic dysfunction -Clinically compensated  COPD -Stable, no wheezing, continue albuterol as needed  DVT prophylaxis:   Eliquis Code Status:  DNR - confirmed with family Family Communication:  No family at bedside. Disposition Plan:   To be determined      Procedures:   Antimicrobials:    Subjective: Patient seen and examined.  She has no complaints.  She is alert and oriented.  Objective: Vitals:   10/06/19 2046 10/07/19 0439 10/07/19 0948 10/07/19 1407  BP: 115/66 118/68 111/72 (!) 91/58  Pulse: 61 63 66 61  Resp:      Temp: 97.9 F (36.6 C) 98.2 F (36.8 C)  (!) 97.5 F (36.4 C)  TempSrc: Oral Oral  Oral  SpO2: 97% 94%    Weight:      Height:        Intake/Output Summary (Last 24 hours) at 10/07/2019 1524 Last data filed at 10/06/2019 1627 Gross per 24 hour  Intake -  Output 500 ml  Net -500 ml   Filed Weights   10/04/19 0422 10/05/19 0521 10/06/19 0623  Weight: 46 kg 46 kg 47 kg    Examination:  General exam: Appears calm and comfortable  Respiratory system: Clear to auscultation. Respiratory effort normal. Cardiovascular system: S1 & S2 heard, RRR. No JVD, murmurs, rubs, gallops or clicks. No pedal edema. Gastrointestinal system: Abdomen is nondistended, soft and nontender. No organomegaly or masses felt. Normal bowel sounds heard. Central nervous system: Alert and oriented. No  focal neurological deficits. Extremities: Symmetric 5 x 5 power. Skin: No rashes, lesions or ulcers.  Psychiatry: Judgement and insight appear poor, mood & affect appropriate.    Data Reviewed:   CBC: Recent Labs  Lab 10/02/19 1311 10/03/19 0413 10/06/19 0800  WBC 11.9* 8.6 7.0  NEUTROABS 10.3*  --  5.3  HGB 14.9 14.0 14.3  HCT 43.9 43.2 41.5  MCV 106.6* 109.6*  105.9*  PLT 223 204 123456   Basic Metabolic Panel: Recent Labs  Lab 10/02/19 1311 10/03/19 0413 10/05/19 1123 10/06/19 0800  NA 141 141 138 143  K 4.2 3.5 4.3 4.0  CL 101 101 104 109  CO2 23 24 25 24   GLUCOSE 101* 81 101* 86  BUN 12 11 19 20   CREATININE 1.00 0.82 1.01* 0.97  CALCIUM 9.7 9.1 8.9 9.1   GFR: Estimated Creatinine Clearance: 30.9 mL/min (by C-G formula based on SCr of 0.97 mg/dL). Liver Function Tests: Recent Labs  Lab 10/02/19 1311  AST 38  ALT 20  ALKPHOS 53  BILITOT 1.4*  PROT 7.1  ALBUMIN 4.4   No results for input(s): LIPASE, AMYLASE in the last 168 hours. No results for input(s): AMMONIA in the last 168 hours. Coagulation Profile: No results for input(s): INR, PROTIME in the last 168 hours. Cardiac Enzymes: Recent Labs  Lab 10/02/19 1311  CKTOTAL 188   BNP (last 3 results) No results for input(s): PROBNP in the last 8760 hours. HbA1C: No results for input(s): HGBA1C in the last 72 hours. CBG: No results for input(s): GLUCAP in the last 168 hours. Lipid Profile: No results for input(s): CHOL, HDL, LDLCALC, TRIG, CHOLHDL, LDLDIRECT in the last 72 hours. Thyroid Function Tests: No results for input(s): TSH, T4TOTAL, FREET4, T3FREE, THYROIDAB in the last 72 hours. Anemia Panel: No results for input(s): VITAMINB12, FOLATE, FERRITIN, TIBC, IRON, RETICCTPCT in the last 72 hours. Urine analysis:    Component Value Date/Time   COLORURINE YELLOW 10/02/2019 1202   APPEARANCEUR CLEAR 10/02/2019 1202   LABSPEC 1.012 10/02/2019 1202   PHURINE 7.0 10/02/2019 1202   GLUCOSEU NEGATIVE 10/02/2019 1202   HGBUR NEGATIVE 10/02/2019 [place] 10/02/2019 1202   KETONESUR NEGATIVE 10/02/2019 1202   PROTEINUR 100 (A) 10/02/2019 1202   UROBILINOGEN 1.0 05/18/2015 0024   NITRITE NEGATIVE 10/02/2019 1202   LEUKOCYTESUR MODERATE (A) 10/02/2019 1202   Sepsis Labs: @LABRCNTIP (procalcitonin:4,lacticidven:4)  ) Recent Results (from the past  240 hour(s))  Urine culture     Status: Abnormal   Collection Time: 10/02/19  1:11 PM   Specimen: Urine, Random  Result Value Ref Range Status   Specimen Description URINE, RANDOM  Final   Special Requests   Final    NONE Performed at [facility], [place] [address], [place], [place] [address]    Culture MULTIPLE SPECIES PRESENT, SUGGEST RECOLLECTION (A)  Final   Report Status 10/03/2019 FINAL  Final  SARS CORONAVIRUS 2 (TAT 6-24 HRS) Nasopharyngeal Nasopharyngeal Swab     Status: None   Collection Time: 10/02/19  8:29 PM   Specimen: Nasopharyngeal Swab  Result Value Ref Range Status   SARS Coronavirus 2 NEGATIVE NEGATIVE Final    Comment: (NOTE) SARS-CoV-2 target nucleic acids are NOT DETECTED. The SARS-CoV-2 RNA is generally detectable in upper and lower respiratory specimens during the acute phase of infection. Negative results do not preclude SARS-CoV-2 infection, do not rule out co-infections with other pathogens, and should not be used as the sole basis for treatment or other  patient management decisions. Negative results must be combined with clinical observations, patient history, and epidemiological information. The expected result is Negative. Fact Sheet for Patients: SugarRoll.be Fact Sheet for Healthcare Providers: https://www.woods-mathews.com/ This test is not yet approved or cleared by the [place] FDA and  has been authorized for detection and/or diagnosis of SARS-CoV-2 by FDA under an Emergency Use Authorization (EUA). This EUA will remain  in effect (meaning this test can be used) for the duration of the COVID-19 declaration under Section 56 4(b)(1) of the Act, 21 U.S.C. section 360bbb-3(b)(1), unless the authorization is terminated or revoked sooner. Performed at [facility], [place] [address] [place], [place] [address]      Radiology Studies: No results found.  Scheduled Meds: . amiodarone  200 mg  Oral Daily  . apixaban  2.5 mg Oral BID  . cycloSPORINE  1 drop Both Eyes BID  . docusate sodium  100 mg Oral BID  . gabapentin  200 mg Oral QHS  . levothyroxine  50 mcg Oral QHS  . lisinopril  10 mg Oral Daily  . metoprolol succinate  100 mg Oral Daily  . QUEtiapine  25 mg Oral QHS   Continuous Infusions:   LOS: 4 days   Time spent: 25 minutes  Ishmael Holter, MD [facility]  10/07/2019, 3:24 PM

## 2019-10-07 NOTE — Consult Note (Signed)
   [facility] CM Inpatient Consult   10/07/2019  [name] 1931/01/05 [id number]  Patient screened for potential [facility] restart of services.   Review of patient's medical record reveals patient is patient is being recommended for SNF rehab and has been accepted by [facility].  Primary Care Provider is Jani Gravel, Md and this office provides the transition of care.   Plan: Will alert the [facility] PAC RN of patient's transition to [facility] for rehab.   Please place a [facility] Care Management consult as appropriate and for questions contact:   Natividad Brood, RN BSN [facility] Liaison  [phone] business mobile phone Toll free office [phone]  Fax number: [phone] [email or web address]

## 2019-10-08 DIAGNOSIS — I5032 Chronic diastolic (congestive) heart failure: Secondary | ICD-10-CM | POA: Diagnosis not present

## 2019-10-08 DIAGNOSIS — Z853 Personal history of malignant neoplasm of breast: Secondary | ICD-10-CM | POA: Diagnosis not present

## 2019-10-08 DIAGNOSIS — W19XXXA Unspecified fall, initial encounter: Secondary | ICD-10-CM | POA: Diagnosis not present

## 2019-10-08 DIAGNOSIS — R05 Cough: Secondary | ICD-10-CM | POA: Diagnosis not present

## 2019-10-08 DIAGNOSIS — E039 Hypothyroidism, unspecified: Secondary | ICD-10-CM | POA: Diagnosis not present

## 2019-10-08 DIAGNOSIS — I480 Paroxysmal atrial fibrillation: Secondary | ICD-10-CM | POA: Diagnosis not present

## 2019-10-08 DIAGNOSIS — Z89511 Acquired absence of right leg below knee: Secondary | ICD-10-CM | POA: Diagnosis not present

## 2019-10-08 DIAGNOSIS — R0602 Shortness of breath: Secondary | ICD-10-CM | POA: Diagnosis not present

## 2019-10-08 DIAGNOSIS — I499 Cardiac arrhythmia, unspecified: Secondary | ICD-10-CM | POA: Diagnosis not present

## 2019-10-08 DIAGNOSIS — R2681 Unsteadiness on feet: Secondary | ICD-10-CM | POA: Diagnosis not present

## 2019-10-08 DIAGNOSIS — R519 Headache, unspecified: Secondary | ICD-10-CM | POA: Diagnosis not present

## 2019-10-08 DIAGNOSIS — R062 Wheezing: Secondary | ICD-10-CM | POA: Diagnosis not present

## 2019-10-08 DIAGNOSIS — Z7401 Bed confinement status: Secondary | ICD-10-CM | POA: Diagnosis not present

## 2019-10-08 DIAGNOSIS — L03116 Cellulitis of left lower limb: Secondary | ICD-10-CM | POA: Diagnosis not present

## 2019-10-08 DIAGNOSIS — I422 Other hypertrophic cardiomyopathy: Secondary | ICD-10-CM | POA: Diagnosis not present

## 2019-10-08 DIAGNOSIS — R778 Other specified abnormalities of plasma proteins: Secondary | ICD-10-CM | POA: Diagnosis not present

## 2019-10-08 DIAGNOSIS — R52 Pain, unspecified: Secondary | ICD-10-CM | POA: Diagnosis not present

## 2019-10-08 DIAGNOSIS — M2550 Pain in unspecified joint: Secondary | ICD-10-CM | POA: Diagnosis not present

## 2019-10-08 DIAGNOSIS — N189 Chronic kidney disease, unspecified: Secondary | ICD-10-CM | POA: Diagnosis not present

## 2019-10-08 DIAGNOSIS — Z20828 Contact with and (suspected) exposure to other viral communicable diseases: Secondary | ICD-10-CM | POA: Diagnosis not present

## 2019-10-08 DIAGNOSIS — S81802A Unspecified open wound, left lower leg, initial encounter: Secondary | ICD-10-CM | POA: Diagnosis not present

## 2019-10-08 DIAGNOSIS — G309 Alzheimer's disease, unspecified: Secondary | ICD-10-CM | POA: Diagnosis not present

## 2019-10-08 DIAGNOSIS — G9340 Encephalopathy, unspecified: Secondary | ICD-10-CM | POA: Diagnosis not present

## 2019-10-08 DIAGNOSIS — K5900 Constipation, unspecified: Secondary | ICD-10-CM | POA: Diagnosis not present

## 2019-10-08 DIAGNOSIS — I10 Essential (primary) hypertension: Secondary | ICD-10-CM | POA: Diagnosis not present

## 2019-10-08 DIAGNOSIS — Z950 Presence of cardiac pacemaker: Secondary | ICD-10-CM | POA: Diagnosis not present

## 2019-10-08 DIAGNOSIS — M62838 Other muscle spasm: Secondary | ICD-10-CM | POA: Diagnosis not present

## 2019-10-08 DIAGNOSIS — F05 Delirium due to known physiological condition: Secondary | ICD-10-CM | POA: Diagnosis not present

## 2019-10-08 DIAGNOSIS — T7840XD Allergy, unspecified, subsequent encounter: Secondary | ICD-10-CM | POA: Diagnosis not present

## 2019-10-08 DIAGNOSIS — G629 Polyneuropathy, unspecified: Secondary | ICD-10-CM | POA: Diagnosis not present

## 2019-10-08 DIAGNOSIS — R4182 Altered mental status, unspecified: Secondary | ICD-10-CM | POA: Diagnosis not present

## 2019-10-08 DIAGNOSIS — R410 Disorientation, unspecified: Secondary | ICD-10-CM | POA: Diagnosis not present

## 2019-10-08 DIAGNOSIS — R278 Other lack of coordination: Secondary | ICD-10-CM | POA: Diagnosis not present

## 2019-10-08 DIAGNOSIS — Z9181 History of falling: Secondary | ICD-10-CM | POA: Diagnosis not present

## 2019-10-08 DIAGNOSIS — Z23 Encounter for immunization: Secondary | ICD-10-CM | POA: Diagnosis not present

## 2019-10-08 DIAGNOSIS — Z7901 Long term (current) use of anticoagulants: Secondary | ICD-10-CM | POA: Diagnosis not present

## 2019-10-08 DIAGNOSIS — H04123 Dry eye syndrome of bilateral lacrimal glands: Secondary | ICD-10-CM | POA: Diagnosis not present

## 2019-10-08 DIAGNOSIS — R0902 Hypoxemia: Secondary | ICD-10-CM | POA: Diagnosis not present

## 2019-10-08 NOTE — Discharge Summary (Signed)
Physician Discharge Summary  [name] [id number] DOB: 07-22-1931 DOA: 10/02/2019  PCP: Jani Gravel, MD  Admit date: 10/02/2019 Discharge date: 10/08/2019  Admitted From: Home Disposition: SNF  Recommendations for Outpatient Follow-up:  1. Follow up with PCP in 1-2 weeks 2. Please obtain BMP/CBC in one week 3. Please follow up on the following pending results:  Home Health: None Equipment/Devices: None  Discharge Condition: Stable CODE STATUS: DNR Diet recommendation: Cardiac  Subjective: Seen and examined.  She is alert and oriented x3.  No complaints.  Comfortable.  Brief/Interim Summary: 85 year old female with history of paroxysmal atrial fibrillation on Eliquis, chronic diastolic CHF, pacemaker, history of breast cancer, left below-knee amputation was brought to the emergency room yesterday following a fall. -Patient reportedly does not use her prosthesis and fell while trying to get off the commode yesterday.  In addition spouse also reported worsening cognition and memory over the past several months.  Her troponins were significantly elevated up to 118 here without any chest pain or shortness of breath.  Echo was obtained which was unremarkable without any wall motion abnormality.  She was also having some delirium.  This was treated with nightly Seroquel which improved her daytime mentation.  She was assessed by PT OT and they recommended SNF.  She is alert and oriented x3, very pleasant with dementia.  Placement has been arranged for her.  She is COVID-19 negative.  She is being discharged in stable condition.  Of note, her TSH was high so her Synthroid was increased from 25 mcg to 50 mcg.   Discharge Diagnoses:  Principal Problem:   Encephalopathy Active Problems:   Atrial fibrillation (HCC)   Essential hypertension   CHF (congestive heart failure) (HCC)   CKD (chronic kidney disease)   Elevated troponin   AMS (altered mental status)    Discharge  Instructions  Discharge Instructions    Discharge patient   Complete by: As directed    Discharge disposition: 03-Skilled [place]   Discharge patient date: 10/08/2019     Allergies as of 10/08/2019      Reactions   Codeine Nausea And Vomiting   Morphine And Related Nausea And Vomiting   Sulfa Antibiotics Nausea And Vomiting   Ceftaroline Rash      Medication List    TAKE these medications   acetaminophen 500 MG tablet Commonly known as: TYLENOL Take 1,000 mg by mouth 2 (two) times daily.   albuterol 108 (90 Base) MCG/ACT inhaler Commonly known as: ProAir HFA Inhale 2 puffs into the lungs every 6 (six) hours as needed for wheezing. What changed: reasons to take this   albuterol (2.5 MG/3ML) 0.083% nebulizer solution Commonly known as: PROVENTIL Take 3 mLs (2.5 mg total) by nebulization every 6 (six) hours as needed for wheezing or shortness of breath. What changed: Another medication with the same name was changed. Make sure you understand how and when to take each.   alendronate 70 MG tablet Commonly known as: FOSAMAX Take 70 mg by mouth every Thursday.   amiodarone 200 MG tablet Commonly known as: PACERONE TAKE 1 TABLET BY MOUTH DAILY   apixaban 2.5 MG Tabs tablet Commonly known as: Eliquis Take 1 tablet (2.5 mg total) by mouth 2 (two) times daily.   Bevespi Aerosphere 9-4.8 MCG/ACT Aero Generic drug: Glycopyrrolate-Formoterol Inhale 1 puff into the lungs daily.   calcium citrate-vitamin D 315-200 MG-UNIT tablet Commonly known as: CITRACAL+D Take 1 tablet by mouth 2 (two) times daily.   cyclobenzaprine 5  MG tablet Commonly known as: FLEXERIL Take 1 tablet (5 mg total) by mouth daily as needed for up to 10 doses for muscle spasms.   cycloSPORINE 0.05 % ophthalmic emulsion Commonly known as: RESTASIS Place 1 drop into both eyes 2 (two) times daily.   fluticasone 50 MCG/ACT nasal spray Commonly known as: FLONASE Place 2 sprays into both nostrils  2 (two) times daily as needed for allergies.   furosemide 20 MG tablet Commonly known as: LASIX Take 1 tablet (20 mg total) by mouth daily.   gabapentin 100 MG capsule Commonly known as: NEURONTIN Take 200 mg by mouth 2 (two) times daily.   guaiFENesin-dextromethorphan 100-10 MG/5ML syrup Commonly known as: ROBITUSSIN DM Take 10 mLs by mouth every 4 (four) hours as needed for cough.   hydrALAZINE 25 MG tablet Commonly known as: APRESOLINE Take 25 mg by mouth 3 (three) times daily.   ibuprofen 200 MG tablet Commonly known as: ADVIL Take 400-600 mg by mouth every 6 (six) hours as needed for headache or mild pain.   levothyroxine 50 MCG tablet Commonly known as: SYNTHROID Take 1 tablet (50 mcg total) by mouth daily before breakfast. What changed:   medication strength  how much to take   lisinopril 10 MG tablet Commonly known as: ZESTRIL Take 1 tablet (10 mg total) by mouth daily.   metoprolol succinate 100 MG 24 hr tablet Commonly known as: TOPROL-XL Take 100 mg by mouth daily.   multivitamin with minerals Tabs tablet Take 1 tablet by mouth daily.   NON FORMULARY Take 1 capsule by mouth See admin instructions. Omega-XL: Take 1 capsule by mouth once a day   potassium chloride SA 20 MEQ tablet Commonly known as: KLOR-CON Take 1 tablet (20 mEq total) by mouth daily.   senna 8.6 MG Tabs tablet Commonly known as: SENOKOT Take 1 tablet (8.6 mg total) by mouth daily as needed for mild constipation.   traMADol 50 MG tablet Commonly known as: ULTRAM Take 1 tablet (50 mg total) by mouth every 4 (four) hours as needed for up to 10 doses (for pain).   umeclidinium bromide 62.5 MCG/INH Aepb Commonly known as: Incruse Ellipta Inhale 1 puff into the lungs 2 (two) times daily as needed (wheezing).   vitamin B-12 1000 MCG tablet Commonly known as: CYANOCOBALAMIN Take 1,000 mcg by mouth daily.       Contact information for follow-up providers    Jani Gravel, MD Follow  up in 1 week(s).   Specialty: Internal Medicine Contact information: [place] [address] [phone]            Contact information for after-discharge care    Destination    [facility] Preferred SNF .   Service: Skilled Nursing Contact information: [place] [phone]                 Allergies  Allergen Reactions  . Codeine Nausea And Vomiting  . Morphine And Related Nausea And Vomiting  . Sulfa Antibiotics Nausea And Vomiting  . Ceftaroline Rash    Consultations: None   Procedures/Studies: Ct Head Wo Contrast  Result Date: 10/02/2019 CLINICAL DATA:  Fall, altered mental status. EXAM: CT HEAD WITHOUT CONTRAST CT CERVICAL SPINE WITHOUT CONTRAST TECHNIQUE: Multidetector CT imaging of the head and cervical spine was performed following the standard protocol without intravenous contrast. Multiplanar CT image reconstructions of the cervical spine were also generated. COMPARISON:  None.  04/06/2018 FINDINGS:  CT HEAD FINDINGS Brain: Changes of atrophy and chronic microvascular ischemic changes. No signs of intracranial hemorrhage or extra-axial fluid collection. No midline shift. Vascular: No hyperdense vessel or unexpected calcification. Skull: No signs of acute fracture.  Mastoid air cells are clear. Sinuses/Orbits: No air-fluid level.  No signs of sinus disease. Other: Choose 1 CT CERVICAL SPINE FINDINGS Alignment: Alignment is unchanged based on comparison with previous imaging. Accentuation of cervical lordosis is noted, associated with degenerative changes throughout the spine. Postoperative changes are noted at C6-C7 with anterior discectomy and fusion as before. Mild anterolisthesis of C7 on T1 is unchanged as compared to the prior exam. Skull base and vertebrae: No acute fracture. No primary bone lesion or focal pathologic process. Soft tissues and spinal canal: No  prevertebral fluid or swelling. No visible canal hematoma. Disc levels: Multilevel degenerative changes as discussed above worse at C4-5, C5-6 and C6-7. Facet arthropathy is redemonstrated at these levels. Fusion again noted at C6-C7. Upper chest: Negative. Other: No signs of soft tissue mass. Streak artifact from anterior cervical hardware. IMPRESSION: Chronic microvascular ischemic changes and generalized atrophy in the brain without acute intracranial finding. Extensive spinal degenerative changes and signs of anterior cervical discectomy and fusion, with no signs of acute spine fracture or subluxation. Electronically Signed   By: Zetta Bills M.D.   On: 10/02/2019 13:17   Ct Cervical Spine Wo Contrast  Result Date: 10/02/2019 CLINICAL DATA:  Fall, altered mental status. EXAM: CT HEAD WITHOUT CONTRAST CT CERVICAL SPINE WITHOUT CONTRAST TECHNIQUE: Multidetector CT imaging of the head and cervical spine was performed following the standard protocol without intravenous contrast. Multiplanar CT image reconstructions of the cervical spine were also generated. COMPARISON:  None.  04/06/2018 FINDINGS: CT HEAD FINDINGS Brain: Changes of atrophy and chronic microvascular ischemic changes. No signs of intracranial hemorrhage or extra-axial fluid collection. No midline shift. Vascular: No hyperdense vessel or unexpected calcification. Skull: No signs of acute fracture.  Mastoid air cells are clear. Sinuses/Orbits: No air-fluid level.  No signs of sinus disease. Other: Choose 1 CT CERVICAL SPINE FINDINGS Alignment: Alignment is unchanged based on comparison with previous imaging. Accentuation of cervical lordosis is noted, associated with degenerative changes throughout the spine. Postoperative changes are noted at C6-C7 with anterior discectomy and fusion as before. Mild anterolisthesis of C7 on T1 is unchanged as compared to the prior exam. Skull base and vertebrae: No acute fracture. No primary bone lesion or focal  pathologic process. Soft tissues and spinal canal: No prevertebral fluid or swelling. No visible canal hematoma. Disc levels: Multilevel degenerative changes as discussed above worse at C4-5, C5-6 and C6-7. Facet arthropathy is redemonstrated at these levels. Fusion again noted at C6-C7. Upper chest: Negative. Other: No signs of soft tissue mass. Streak artifact from anterior cervical hardware. IMPRESSION: Chronic microvascular ischemic changes and generalized atrophy in the brain without acute intracranial finding. Extensive spinal degenerative changes and signs of anterior cervical discectomy and fusion, with no signs of acute spine fracture or subluxation. Electronically Signed   By: Zetta Bills M.D.   On: 10/02/2019 13:17   Dg Chest Portable 1 View  Result Date: 10/02/2019 CLINICAL DATA:  Found on floor.  Fall. EXAM: PORTABLE CHEST 1 VIEW COMPARISON:  01/28/2019 FINDINGS: Right pacer remains in place, unchanged. Prior CABG. Cardiomegaly cysts with mild vascular congestion. No confluent opacities, effusions, edema or pneumothorax. No visible acute bony abnormality. IMPRESSION: Cardiomegaly with vascular congestion.  No acute findings. Electronically Signed   By: Rolm Baptise M.D.  On: 10/02/2019 12:35      Discharge Exam: Vitals:   10/08/19 0513 10/08/19 0809  BP: 108/74 111/76  Pulse: 69 72  Resp:    Temp: 97.8 F (36.6 C)   SpO2: 99%    Vitals:   10/07/19 1407 10/07/19 2042 10/08/19 0513 10/08/19 0809  BP: (!) 91/58 134/70 108/74 111/76  Pulse: 61 63 69 72  Resp:      Temp: (!) 97.5 F (36.4 C) 97.6 F (36.4 C) 97.8 F (36.6 C)   TempSrc: Oral Oral Oral   SpO2:  95% 99%   Weight:   47 kg   Height:        General: Pt is alert, awake, not in acute distress Cardiovascular: RRR, S1/S2 +, no rubs, no gallops Respiratory: CTA bilaterally, no wheezing, no rhonchi Abdominal: Soft, NT, ND, bowel sounds + Extremities: no edema, no cyanosis    The results of significant  diagnostics from this hospitalization (including imaging, microbiology, ancillary and laboratory) are listed below for reference.     Microbiology: Recent Results (from the past 240 hour(s))  Urine culture     Status: Abnormal   Collection Time: 10/02/19  1:11 PM   Specimen: Urine, Random  Result Value Ref Range Status   Specimen Description URINE, RANDOM  Final   Special Requests   Final    NONE Performed at [facility], [address] [place], [place] [address]    Culture MULTIPLE SPECIES PRESENT, SUGGEST RECOLLECTION (A)  Final   Report Status 10/03/2019 FINAL  Final  SARS CORONAVIRUS 2 (TAT 6-24 HRS) Nasopharyngeal Nasopharyngeal Swab     Status: None   Collection Time: 10/02/19  8:29 PM   Specimen: Nasopharyngeal Swab  Result Value Ref Range Status   SARS Coronavirus 2 NEGATIVE NEGATIVE Final    Comment: (NOTE) SARS-CoV-2 target nucleic acids are NOT DETECTED. The SARS-CoV-2 RNA is generally detectable in upper and lower respiratory specimens during the acute phase of infection. Negative results do not preclude SARS-CoV-2 infection, do not rule out co-infections with other pathogens, and should not be used as the sole basis for treatment or other patient management decisions. Negative results must be combined with clinical observations, patient history, and epidemiological information. The expected result is Negative. Fact Sheet for Patients: SugarRoll.be Fact Sheet for Healthcare Providers: https://www.woods-mathews.com/ This test is not yet approved or cleared by the [place] FDA and  has been authorized for detection and/or diagnosis of SARS-CoV-2 by FDA under an Emergency Use Authorization (EUA). This EUA will remain  in effect (meaning this test can be used) for the duration of the COVID-19 declaration under Section 56 4(b)(1) of the Act, 21 U.S.C. section 360bbb-3(b)(1), unless the authorization is terminated  or revoked sooner. Performed at [facility], [place] [address], [place], [place] [address]   Novel Coronavirus, NAA (Hosp order, Send-out to Ref Lab; TAT 18-24 hrs     Status: None   Collection Time: 10/06/19 11:33 AM   Specimen: Nasopharyngeal Swab; Respiratory  Result Value Ref Range Status   SARS-CoV-2, NAA NOT DETECTED NOT DETECTED Final    Comment: (NOTE) This nucleic acid amplification test was developed and its performance characteristics determined by [employer]. Nucleic acid amplification tests include PCR and TMA. This test has not been FDA cleared or approved. This test has been authorized by FDA under an Emergency Use Authorization (EUA). This test is only authorized for the duration of time the declaration that circumstances exist justifying the  authorization of the emergency use of in vitro diagnostic tests for detection of SARS-CoV-2 virus and/or diagnosis of COVID-19 infection under section 564(b)(1) of the Act, 21 U.S.C. [id number]) (1), unless the authorization is terminated or revoked sooner. When diagnostic testing is negative, the possibility of a false negative result should be considered in the context of a patient's recent exposures and the presence of clinical signs and symptoms consistent with COVID-19. An individual without symptoms of COVID- 19 and who is not shedding SARS-CoV-2 vi rus would expect to have a negative (not detected) result in this assay. Performed At: [facility] [address] [place], [place] [id number] Rush Farmer MD [id number]    [place]  Final    Comment: Performed at [facility], [place] [address], [place], [place] [address]     Labs: BNP (last 3 results) Recent Labs    01/28/19 1041  BNP [id number]*   Basic Metabolic Panel: Recent Labs  Lab 10/02/19 1311 10/03/19 0413 10/05/19 1123 10/06/19 0800  NA 141 141 138 143  K 4.2 3.5 4.3 4.0  CL 101 101 104 109  CO2  23 24 25 24   GLUCOSE 101* 81 101* 86  BUN 12 11 19 20   CREATININE 1.00 0.82 1.01* 0.97  CALCIUM 9.7 9.1 8.9 9.1   Liver Function Tests: Recent Labs  Lab 10/02/19 1311  AST 38  ALT 20  ALKPHOS 53  BILITOT 1.4*  PROT 7.1  ALBUMIN 4.4   No results for input(s): LIPASE, AMYLASE in the last 168 hours. No results for input(s): AMMONIA in the last 168 hours. CBC: Recent Labs  Lab 10/02/19 1311 10/03/19 0413 10/06/19 0800  WBC 11.9* 8.6 7.0  NEUTROABS 10.3*  --  5.3  HGB 14.9 14.0 14.3  HCT 43.9 43.2 41.5  MCV 106.6* 109.6* 105.9*  PLT 223 204 203   Cardiac Enzymes: Recent Labs  Lab 10/02/19 1311  CKTOTAL 188   BNP: Invalid input(s): POCBNP CBG: No results for input(s): GLUCAP in the last 168 hours. D-Dimer No results for input(s): DDIMER in the last 72 hours. Hgb A1c No results for input(s): HGBA1C in the last 72 hours. Lipid Profile No results for input(s): CHOL, HDL, LDLCALC, TRIG, CHOLHDL, LDLDIRECT in the last 72 hours. Thyroid function studies No results for input(s): TSH, T4TOTAL, T3FREE, THYROIDAB in the last 72 hours.  Invalid input(s): FREET3 Anemia work up No results for input(s): VITAMINB12, FOLATE, FERRITIN, TIBC, IRON, RETICCTPCT in the last 72 hours. Urinalysis    Component Value Date/Time   COLORURINE YELLOW 10/02/2019 1202   APPEARANCEUR CLEAR 10/02/2019 1202   LABSPEC 1.012 10/02/2019 1202   PHURINE 7.0 10/02/2019 1202   GLUCOSEU NEGATIVE 10/02/2019 [place] 10/02/2019 [place] 10/02/2019 1202   [place] 10/02/2019 1202   PROTEINUR 100 (A) 10/02/2019 1202   UROBILINOGEN 1.0 05/18/2015 0024   NITRITE NEGATIVE 10/02/2019 1202   LEUKOCYTESUR MODERATE (A) 10/02/2019 1202   Sepsis Labs Invalid input(s): PROCALCITONIN,  WBC,  LACTICIDVEN Microbiology Recent Results (from the past 240 hour(s))  Urine culture     Status: Abnormal   Collection Time: 10/02/19  1:11 PM   Specimen: Urine, Random   Result Value Ref Range Status   Specimen Description URINE, RANDOM  Final   Special Requests   Final    NONE Performed at [facility], [place] [address], [place],  [address]    Culture MULTIPLE SPECIES PRESENT, SUGGEST RECOLLECTION (A)  Final  Report Status 10/03/2019 FINAL  Final  SARS CORONAVIRUS 2 (TAT 6-24 HRS) Nasopharyngeal Nasopharyngeal Swab     Status: None   Collection Time: 10/02/19  8:29 PM   Specimen: Nasopharyngeal Swab  Result Value Ref Range Status   SARS Coronavirus 2 NEGATIVE NEGATIVE Final    Comment: (NOTE) SARS-CoV-2 target nucleic acids are NOT DETECTED. The SARS-CoV-2 RNA is generally detectable in upper and lower respiratory specimens during the acute phase of infection. Negative results do not preclude SARS-CoV-2 infection, do not rule out co-infections with other pathogens, and should not be used as the sole basis for treatment or other patient management decisions. Negative results must be combined with clinical observations, patient history, and epidemiological information. The expected result is Negative. Fact Sheet for Patients: SugarRoll.be Fact Sheet for Healthcare Providers: https://www.woods-mathews.com/ This test is not yet approved or cleared by the [place] FDA and  has been authorized for detection and/or diagnosis of SARS-CoV-2 by FDA under an Emergency Use Authorization (EUA). This EUA will remain  in effect (meaning this test can be used) for the duration of the COVID-19 declaration under Section 56 4(b)(1) of the Act, 21 U.S.C. section 360bbb-3(b)(1), unless the authorization is terminated or revoked sooner. Performed at [facility], [place] [address] [place], [place] [address]   Novel Coronavirus, NAA (Hosp order, Send-out to Ref Lab; TAT 18-24 hrs     Status: None   Collection Time: 10/06/19 11:33 AM   Specimen: Nasopharyngeal Swab; Respiratory  Result Value Ref Range  Status   SARS-CoV-2, NAA NOT DETECTED NOT DETECTED Final    Comment: (NOTE) This nucleic acid amplification test was developed and its performance characteristics determined by [employer]. Nucleic acid amplification tests include PCR and TMA. This test has not been FDA cleared or approved. This test has been authorized by FDA under an Emergency Use Authorization (EUA). This test is only authorized for the duration of time the declaration that circumstances exist justifying the authorization of the emergency use of in vitro diagnostic tests for detection of SARS-CoV-2 virus and/or diagnosis of COVID-19 infection under section 564(b)(1) of the Act, 21 U.S.C. [id number]) (1), unless the authorization is terminated or revoked sooner. When diagnostic testing is negative, the possibility of a false negative result should be considered in the context of a patient's recent exposures and the presence of clinical signs and symptoms consistent with COVID-19. An individual without symptoms of COVID- 19 and who is not shedding SARS-CoV-2 vi rus would expect to have a negative (not detected) result in this assay. Performed At: [facility] [address] [place], [place] [id number] Rush Farmer MD [id number]    [place]  Final    Comment: Performed at [facility], [place] [address], [place], [place] [address]     Time coordinating discharge: Over 30 minutes  SIGNED:   Darliss Cheney, MD  [facility] 10/08/2019, 8:35 AM  If 7PM-7AM, please contact night-coverage [email or web address] Password [email or web address]

## 2019-10-08 NOTE — Plan of Care (Signed)

## 2019-10-08 NOTE — TOC Transition Note (Addendum)
Transition of Care [facility]) - CM/SW Discharge Note   Patient Details  Name: [name] MRN: [id number] Date of Birth: 05-Mar-1931  [facility]) CM/SW Contact:  Eileen Stanford, LCSW Phone Number: 10/08/2019, 11:21 AM   Clinical Narrative:   Clinical Social Worker facilitated patient discharge including contacting patient family and facility to confirm patient discharge plans.  Clinical information faxed to facility and family agreeable with plan.  CSW arranged ambulance transport via [facility] to [place] (room 308).  RN to call [phone] for report prior to discharge.   Final next level of care: Skilled Nursing Facility Barriers to Discharge: No Barriers Identified   Patient Goals and CMS Choice Patient states their goals for this hospitalization and ongoing recovery are:: "[facility]" CMS Medicare.gov Compare Post Acute Care list provided to:: Patient Represenative (must comment)(pt's spouse) Choice offered to / list presented to : Spouse  Discharge Placement              Patient chooses bed at: [place] [place] Patient to be transferred to facility by: [place] Name of family member notified: [name], spouse Patient and family notified of of transfer: 10/08/19  Discharge Plan and Services In-house Referral: Clinical Social Work Discharge Planning Services: [employer] Consult Post Acute Care Choice: [place]: NA          Social Determinants of Health (SDOH) Interventions     Readmission Risk Interventions No flowsheet data found.

## 2019-10-13 DIAGNOSIS — F05 Delirium due to known physiological condition: Secondary | ICD-10-CM | POA: Diagnosis not present

## 2019-10-13 DIAGNOSIS — I480 Paroxysmal atrial fibrillation: Secondary | ICD-10-CM | POA: Diagnosis not present

## 2019-10-13 DIAGNOSIS — I422 Other hypertrophic cardiomyopathy: Secondary | ICD-10-CM | POA: Diagnosis not present

## 2019-10-13 DIAGNOSIS — R2681 Unsteadiness on feet: Secondary | ICD-10-CM | POA: Diagnosis not present

## 2019-10-13 DIAGNOSIS — K5900 Constipation, unspecified: Secondary | ICD-10-CM | POA: Diagnosis not present

## 2019-10-13 DIAGNOSIS — L03116 Cellulitis of left lower limb: Secondary | ICD-10-CM | POA: Diagnosis not present

## 2019-10-13 DIAGNOSIS — G309 Alzheimer's disease, unspecified: Secondary | ICD-10-CM | POA: Diagnosis not present

## 2019-10-17 DIAGNOSIS — S81802A Unspecified open wound, left lower leg, initial encounter: Secondary | ICD-10-CM | POA: Diagnosis not present

## 2019-10-22 DIAGNOSIS — I10 Essential (primary) hypertension: Secondary | ICD-10-CM | POA: Diagnosis not present

## 2019-10-22 DIAGNOSIS — E039 Hypothyroidism, unspecified: Secondary | ICD-10-CM | POA: Diagnosis not present

## 2019-10-23 DIAGNOSIS — G9340 Encephalopathy, unspecified: Secondary | ICD-10-CM | POA: Diagnosis not present

## 2019-10-23 DIAGNOSIS — I130 Hypertensive heart and chronic kidney disease with heart failure and stage 1 through stage 4 chronic kidney disease, or unspecified chronic kidney disease: Secondary | ICD-10-CM | POA: Diagnosis not present

## 2019-10-23 DIAGNOSIS — S81822D Laceration with foreign body, left lower leg, subsequent encounter: Secondary | ICD-10-CM | POA: Diagnosis not present

## 2019-10-23 DIAGNOSIS — Z7901 Long term (current) use of anticoagulants: Secondary | ICD-10-CM | POA: Diagnosis not present

## 2019-10-23 DIAGNOSIS — K5900 Constipation, unspecified: Secondary | ICD-10-CM | POA: Diagnosis not present

## 2019-10-23 DIAGNOSIS — M109 Gout, unspecified: Secondary | ICD-10-CM | POA: Diagnosis not present

## 2019-10-23 DIAGNOSIS — M80011D Age-related osteoporosis with current pathological fracture, right shoulder, subsequent encounter for fracture with routine healing: Secondary | ICD-10-CM | POA: Diagnosis not present

## 2019-10-23 DIAGNOSIS — Z89611 Acquired absence of right leg above knee: Secondary | ICD-10-CM | POA: Diagnosis not present

## 2019-10-23 DIAGNOSIS — I509 Heart failure, unspecified: Secondary | ICD-10-CM | POA: Diagnosis not present

## 2019-10-23 DIAGNOSIS — Z9181 History of falling: Secondary | ICD-10-CM | POA: Diagnosis not present

## 2019-10-23 DIAGNOSIS — Z96653 Presence of artificial knee joint, bilateral: Secondary | ICD-10-CM | POA: Diagnosis not present

## 2019-10-23 DIAGNOSIS — M150 Primary generalized (osteo)arthritis: Secondary | ICD-10-CM | POA: Diagnosis not present

## 2019-10-23 DIAGNOSIS — I480 Paroxysmal atrial fibrillation: Secondary | ICD-10-CM | POA: Diagnosis not present

## 2019-10-23 DIAGNOSIS — Z950 Presence of cardiac pacemaker: Secondary | ICD-10-CM | POA: Diagnosis not present

## 2019-10-23 DIAGNOSIS — G629 Polyneuropathy, unspecified: Secondary | ICD-10-CM | POA: Diagnosis not present

## 2019-10-23 DIAGNOSIS — J45909 Unspecified asthma, uncomplicated: Secondary | ICD-10-CM | POA: Diagnosis not present

## 2019-10-23 DIAGNOSIS — E039 Hypothyroidism, unspecified: Secondary | ICD-10-CM | POA: Diagnosis not present

## 2019-10-23 DIAGNOSIS — M810 Age-related osteoporosis without current pathological fracture: Secondary | ICD-10-CM | POA: Diagnosis not present

## 2019-10-23 DIAGNOSIS — R296 Repeated falls: Secondary | ICD-10-CM | POA: Diagnosis not present

## 2019-10-23 DIAGNOSIS — E46 Unspecified protein-calorie malnutrition: Secondary | ICD-10-CM | POA: Diagnosis not present

## 2019-10-23 DIAGNOSIS — N189 Chronic kidney disease, unspecified: Secondary | ICD-10-CM | POA: Diagnosis not present

## 2019-10-23 DIAGNOSIS — Z853 Personal history of malignant neoplasm of breast: Secondary | ICD-10-CM | POA: Diagnosis not present

## 2019-10-24 ENCOUNTER — Other Ambulatory Visit: Payer: Self-pay

## 2019-10-24 ENCOUNTER — Ambulatory Visit (INDEPENDENT_AMBULATORY_CARE_PROVIDER_SITE_OTHER): Payer: Medicare Other | Admitting: Cardiology

## 2019-10-24 ENCOUNTER — Encounter: Payer: Self-pay | Admitting: Cardiology

## 2019-10-24 VITALS — BP 89/47 | HR 68 | Temp 97.8°F | Ht 63.0 in | Wt 106.3 lb

## 2019-10-24 DIAGNOSIS — I4821 Permanent atrial fibrillation: Secondary | ICD-10-CM | POA: Diagnosis not present

## 2019-10-24 DIAGNOSIS — Z950 Presence of cardiac pacemaker: Secondary | ICD-10-CM | POA: Diagnosis not present

## 2019-10-24 DIAGNOSIS — I5032 Chronic diastolic (congestive) heart failure: Secondary | ICD-10-CM

## 2019-10-24 DIAGNOSIS — S81822D Laceration with foreign body, left lower leg, subsequent encounter: Secondary | ICD-10-CM | POA: Diagnosis not present

## 2019-10-24 DIAGNOSIS — I442 Atrioventricular block, complete: Secondary | ICD-10-CM

## 2019-10-24 DIAGNOSIS — Z45018 Encounter for adjustment and management of other part of cardiac pacemaker: Secondary | ICD-10-CM

## 2019-10-24 DIAGNOSIS — M109 Gout, unspecified: Secondary | ICD-10-CM | POA: Diagnosis not present

## 2019-10-24 DIAGNOSIS — I130 Hypertensive heart and chronic kidney disease with heart failure and stage 1 through stage 4 chronic kidney disease, or unspecified chronic kidney disease: Secondary | ICD-10-CM | POA: Diagnosis not present

## 2019-10-24 DIAGNOSIS — I509 Heart failure, unspecified: Secondary | ICD-10-CM | POA: Diagnosis not present

## 2019-10-24 DIAGNOSIS — N189 Chronic kidney disease, unspecified: Secondary | ICD-10-CM | POA: Diagnosis not present

## 2019-10-24 DIAGNOSIS — M80011D Age-related osteoporosis with current pathological fracture, right shoulder, subsequent encounter for fracture with routine healing: Secondary | ICD-10-CM | POA: Diagnosis not present

## 2019-10-24 NOTE — Progress Notes (Signed)
Primary Physician/Referring:  Jani Gravel, MD  Patient ID: [name], female    DOB: [birth year], 85 y.o.   MRN: [id number]  Chief Complaint  Patient presents with   Tanna Furry   Results   Follow-up    37mo w/ PACERCHECK    HPI: [name]  is a 85 y.o. female  with complete heart block S/P permanent pacemaker implantation on 11/27/2013, permanent atrial fibrillation on chronic anticoagulation with Eliquis,  hypertrophic cardiomyopathy status post septal ablation at [facility] sometime in [id number], Chronic systolic and diastolic heart failure due to burnt out HOCM, EF improved to  normal on medical therapy by echo in 01/2019, moderate pulmonary hypertension, severe COPD due to bronchial asthma, stage III chronic kidney disease and anemia of chronic disease.   She was admitted to the hospital on 10/02/2019 after a fall and also for failure to thrive an adult, she tried to sit on the commode, slipped and fell down and EMS to come and get her up and take her to the emergency room.  She was also found to have minimally elevated high since 2 serum troponin, felt to be probably from chronic diastolic heart failure.  She underwent physical therapy and was then transferred to [facility] assisted living facility for 2 weeks, she regained her strength back, started walking with a walker again and she is now back home.  No specific complaints today, denies shortness of breath or chest pain.  Presents for evaluation of chronic diastolic heart failure and also pacemaker function.  Past Medical History:  Diagnosis Date   Acute on chronic diastolic CHF (congestive heart failure) (HCC) 05/18/2015   Acute respiratory failure (HCC) 06/09/2016   Anemia    Arthritis    Arthritis pain    Asthma    Blood transfusion    Bronchitis    Cancer (HCC)    lt breast   CHF (congestive heart failure) (HCC)    Constipation    Current use of long term anticoagulation    Cystitis     Encounter for care of pacemaker 06/13/2019   Heart block    Complete heart block post surgical requiring pacer   Humerus shaft fracture    nonsurgical, June 2017   Hypertr obst cardiomyop    Required septal myotomy   Hypokalemia    Postoperative, improved   Hyponatremia    Mild postoperative, improved   Memory difficulty    Mitral regurgitation    Pacemaker    Urinary incontinence     Past Surgical History:  Procedure Laterality Date   ABDOMINAL HYSTERECTOMY     ABOVE KNEE LEG AMPUTATION Right    Anterior cervical diskectomy and fusion     C-7   BELOW KNEE LEG AMPUTATION Right    BREAST BIOPSY     Left, negative   BREAST LUMPECTOMY  2012   left breast   cardiomyopathy     COLONOSCOPY     FEMUR FRACTURE SURGERY     Right   FRACTURE SURGERY     JOINT REPLACEMENT     ORIF ELBOW FRACTURE Right 04/08/2018   Procedure: OPEN REDUCTION INTERNAL FIXATION (ORIF) ELBOW/OLECRANON FRACTURE;  Surgeon: Renette Butters, MD;  Location: [facility];  Service: Orthopedics;  Laterality: Right;   PACEMAKER PLACEMENT  2006/11-27-2013   MDT dual chamber pacemaker implanted 2006 with gen change by Dr Lovena Le 11-27-2013   PERMANENT PACEMAKER GENERATOR CHANGE N/A 11/27/2013   Procedure: PERMANENT PACEMAKER GENERATOR CHANGE;  Surgeon: Carleene Overlie  Peyton Najjar, MD;  Location: [facility] CATH [facility];  Service: Cardiovascular;  Laterality: N/A;   REPLACEMENT TOTAL KNEE BILATERAL     Septal myotomy     SHOULDER SURGERY     Right   WRIST FRACTURE SURGERY     Right    Social History   Socioeconomic History   Marital status: Married    Spouse name: Not on file   Number of children: 2   Years of education: Not on file   Highest education level: Not on file  Occupational History   Not on file  Social Needs   Financial resource strain: Not on file   Food insecurity    Worry: Not on file    Inability: Not on file   Transportation needs    Medical: Not on file    Non-medical: Not on  file  Tobacco Use   Smoking status: Never Smoker   Smokeless tobacco: Never Used  Substance and Sexual Activity   Alcohol use: Not Currently   Drug use: Never   Sexual activity: Not Currently  Lifestyle   Physical activity    Days per week: Not on file    Minutes per session: Not on file   Stress: Not on file  Relationships   Social connections    Talks on phone: Not on file    Gets together: Not on file    Attends religious service: Not on file    Active member of club or organization: Not on file    Attends meetings of clubs or organizations: Not on file    Relationship status: Not on file   Intimate partner violence    Fear of current or ex partner: Not on file    Emotionally abused: Not on file    Physically abused: Not on file    Forced sexual activity: Not on file  Other Topics Concern   Not on file  Social History Narrative   ** Merged History Encounter **       Married   Two children 1 passed   Husband will be assisting with care after surgery    Current Outpatient Medications on File Prior to Visit  Medication Sig Dispense Refill   acetaminophen (TYLENOL) 500 MG tablet Take 1,000 mg by mouth 2 (two) times daily.      albuterol (PROAIR HFA) 108 (90 BASE) MCG/ACT inhaler Inhale 2 puffs into the lungs every 6 (six) hours as needed for wheezing. (Patient taking differently: Inhale 2 puffs into the lungs every 6 (six) hours as needed for wheezing or shortness of breath. ) 1 Inhaler 3   albuterol (PROVENTIL) (2.5 MG/3ML) 0.083% nebulizer solution Take 3 mLs (2.5 mg total) by nebulization every 6 (six) hours as needed for wheezing or shortness of breath. 75 mL 12   alendronate (FOSAMAX) 70 MG tablet Take 70 mg by mouth every Thursday.     amiodarone (PACERONE) 200 MG tablet TAKE 1 TABLET BY MOUTH DAILY (Patient taking differently: Take 200 mg by mouth daily. ) 30 tablet 6   apixaban (ELIQUIS) 2.5 MG TABS tablet Take 1 tablet (2.5 mg total) by mouth 2 (two)  times daily. 60 tablet 1   calcium citrate-vitamin D (CITRACAL+D) 315-200 MG-UNIT per tablet Take 1 tablet by mouth 2 (two) times daily. 60 tablet 1   cyclobenzaprine (FLEXERIL) 5 MG tablet Take 1 tablet (5 mg total) by mouth daily as needed for up to 10 doses for muscle spasms. 10 tablet 0   cycloSPORINE (RESTASIS)  0.05 % ophthalmic emulsion Place 1 drop into both eyes 2 (two) times daily. 0.4 mL 1   fluticasone (FLONASE) 50 MCG/ACT nasal spray Place 2 sprays into both nostrils 2 (two) times daily as needed for allergies.      furosemide (LASIX) 20 MG tablet Take 1 tablet (20 mg total) by mouth daily.     gabapentin (NEURONTIN) 100 MG capsule Take 200 mg by mouth 2 (two) times daily.     guaiFENesin-dextromethorphan (ROBITUSSIN DM) 100-10 MG/5ML syrup Take 10 mLs by mouth every 4 (four) hours as needed for cough. 118 mL 0   hydrALAZINE (APRESOLINE) 25 MG tablet Take 25 mg by mouth 3 (three) times daily.     ibuprofen (ADVIL) 200 MG tablet Take 400-600 mg by mouth every 6 (six) hours as needed for headache or mild pain.     levothyroxine (SYNTHROID) 50 MCG tablet Take 1 tablet (50 mcg total) by mouth daily before breakfast. (Patient taking differently: Take 25 mcg by mouth 2 (two) times daily. ) 30 tablet 0   lisinopril (PRINIVIL,ZESTRIL) 10 MG tablet Take 1 tablet (10 mg total) by mouth daily. 90 tablet 3   metoprolol succinate (TOPROL-XL) 100 MG 24 hr tablet Take 100 mg by mouth daily.     Multiple Vitamin (MULTIVITAMIN WITH MINERALS) TABS tablet Take 1 tablet by mouth daily.     NON FORMULARY Take 1 capsule by mouth See admin instructions. Omega-XL: Take 1 capsule by mouth once a day     potassium chloride SA (K-DUR,KLOR-CON) 20 MEQ tablet Take 1 tablet (20 mEq total) by mouth daily.     senna (SENOKOT) 8.6 MG TABS tablet Take 1 tablet (8.6 mg total) by mouth daily as needed for mild constipation.     traMADol (ULTRAM) 50 MG tablet Take 1 tablet (50 mg total) by mouth every 4  (four) hours as needed for up to 10 doses (for pain). 10 tablet 0   vitamin B-12 (CYANOCOBALAMIN) 1000 MCG tablet Take 1,000 mcg by mouth daily.      BEVESPI AEROSPHERE 9-4.8 MCG/ACT AERO Inhale 1 puff into the lungs daily.      No current facility-administered medications on file prior to visit.     Review of Systems  Constitution: Negative for chills, decreased appetite, malaise/fatigue and weight gain.  Cardiovascular: Positive for dyspnea on exertion (improved and stable). Negative for leg swelling (Left BKA) and syncope.  Endocrine: Negative for cold intolerance.  Hematologic/Lymphatic: Does not bruise/bleed easily.  Musculoskeletal: Positive for arthritis and back pain. Negative for joint swelling.  Gastrointestinal: Negative for abdominal pain, anorexia and change in bowel habit.  Neurological: Negative for headaches and light-headedness.  Psychiatric/Behavioral: Negative for depression and substance abuse.  All other systems reviewed and are negative.     Objective  Blood pressure (!) 89/47, pulse 68, temperature 97.8 F (36.6 C), height 5\' 3"  (1.6 m), weight 106 lb 4.8 oz (48.2 kg), SpO2 96 %. Body mass index is 18.83 kg/m. Physical exam not performed or limited due to virtual visit via telephone. Please see exam details from prior visit is as below. No VS available.    Physical Exam  Constitutional: She appears well-developed and well-nourished. No distress.  HENT:  Head: Atraumatic.  Eyes: Conjunctivae are normal.  Neck: Neck supple. No JVD present. No thyromegaly present.  Cardiovascular: Normal rate, regular rhythm and intact distal pulses. Exam reveals no gallop.  Murmur heard. High-pitched blowing holosystolic murmur is present with a grade of 2/6 at the apex  radiating to the apex. Pulses:      Carotid pulses are 2+ on the right side and 2+ on the left side.      Radial pulses are 2+ on the right side and 2+ on the left side.       Femoral pulses are 2+ on the  right side and 2+ on the left side.      Popliteal pulses are 0 on the left side. Right popliteal pulse not accessible.       Dorsalis pedis pulses are 1+ on the left side. Right dorsalis pedis pulse not accessible.       Posterior tibial pulses are 0 on the left side. Right posterior tibial pulse not accessible.  Pulmonary/Chest: Effort normal and breath sounds normal.  Abdominal: Soft. Bowel sounds are normal.  Musculoskeletal: Normal range of motion.        General: No edema.  Neurological: She is alert.  Skin: Skin is warm and dry.  Psychiatric: She has a normal mood and affect.   Radiology: No results found.  Laboratory examination:    CMP Latest Ref Rng & Units 10/06/2019 10/05/2019 10/03/2019  Glucose 70 - 99 mg/dL 86 101(H) 81  BUN 8 - 23 mg/dL 20 19 11   Creatinine 0.44 - 1.00 mg/dL 0.97 1.01(H) 0.82  Sodium 135 - 145 mmol/L 143 138 141  Potassium 3.5 - 5.1 mmol/L 4.0 4.3 3.5  Chloride 98 - 111 mmol/L 109 104 101  CO2 22 - 32 mmol/L 24 25 24   Calcium 8.9 - 10.3 mg/dL 9.1 8.9 9.1  Total Protein 6.5 - 8.1 g/dL - - -  Total Bilirubin 0.3 - 1.2 mg/dL - - -  Alkaline Phos 38 - 126 U/L - - -  AST 15 - 41 U/L - - -  ALT 0 - 44 U/L - - -   CBC Latest Ref Rng & Units 10/06/2019 10/03/2019 10/02/2019  WBC 4.0 - 10.5 K/uL 7.0 8.6 11.9(H)  Hemoglobin 12.0 - 15.0 g/dL 14.3 14.0 14.9  Hematocrit 36.0 - 46.0 % 41.5 43.2 43.9  Platelets 150 - 400 K/uL 203 204 223   Cardiac Studies:   Echocardiogram 10/04/2019:  Normal LVEF, 65-70%.  Severe LVH.  Diastolic function not evaluated. Normal right ventricle systolic function and size.  Severe mitral and the calcification, moderate to severe mitral valve regurgitation.  Mild-to-moderate tricuspid regurgitation, moderate pulmonary hypertension, PA systolic pressure 52 mmHg.  Assessment     ICD-10-CM   1. Encounter for care of pacemaker  Z45.018   2. Pacemaker Medtronic Adapta dual-chamber pacemaker 2006, generator change 11/27/2013,  programmed to VVI   Z95.0   3. Heart block AV complete (HCC)  I44.2   4. Permanent atrial fibrillation (HCC)  I48.21   5. Chronic diastolic congestive heart failure ([place])  I50.32     EKG 02/18/19: Underlying A. Fib. Electronic ventricular pacemaker Pacemaker ECG, No further analysis   Scheduled  In office pacemaker check 10/24/19  Single-chamber pacemaker, underlying be paced rhythm at 40 bpm, pacer dependent.  3 brief NSVT episodes. 8.5 years longevity.  Lead measurements are stable.  Normal pacemaker function, no changes done today.     Recommendations:   Patient who is pacemaker dependent with underlying complete heart block and permanent atrial fibrillation presently on long-term and correlation with Eliquis, chronic diastolic heart failure, hypertrophic cardiomyopathy S/P myomectomy in the remote past in [place] clinic who presents here for follow-up of pacemaker function and also chronic diastolic heart failure.  Admitted recently  on 10/02/2019 after a fall and failure to thrive in an adult.  Minimally positive serum troponin felt to be due to heart failure.  I have reviewed her echocardiogram.  Presently no clinical evidence of heart failure and she is doing well.  I reviewed her pacemaker, she is pacemaker dependent and is normally functioning.  She has had prior NSVT however in view of advanced age, normal LVEF, would recommend controlled observation only.  I'll see her back in 6 months for follow-up of hypertension, chronic diastolic CHF and cardiomyopathy.  Adrian Prows, MD, [facility] 10/24/2019, 1:24 PM [facility] [place] Pager: [phone] Office: [phone] If no answer Cell [phone]

## 2019-10-25 ENCOUNTER — Other Ambulatory Visit: Payer: Self-pay | Admitting: *Deleted

## 2019-10-25 DIAGNOSIS — I10 Essential (primary) hypertension: Secondary | ICD-10-CM

## 2019-10-25 NOTE — Patient Outreach (Signed)
Member assessed for potential [facility] Care Management needs as a benefit of [place] [employer].  Verified in Patient [place] that Mrs. Preiss discharged from [facility] on 10/22/19. Member discharged prior to writer's outreach.   Will make referral to [facility] for complex case management. Mrs. Heitmeyer has medical history of  CHF, AFIB, HTN, left BKA, breast cancer. Per chart review,  Mrs. Fehringer lived with husband prior to admission.     Marthenia Rolling, MSN-Ed, RN,BSN [facility] Coordinator [phone] [facility]) [phone]  (Toll free office)

## 2019-10-28 ENCOUNTER — Other Ambulatory Visit: Payer: Self-pay

## 2019-10-28 DIAGNOSIS — N183 Chronic kidney disease, stage 3 unspecified: Secondary | ICD-10-CM | POA: Diagnosis not present

## 2019-10-28 DIAGNOSIS — I10 Essential (primary) hypertension: Secondary | ICD-10-CM | POA: Diagnosis not present

## 2019-10-28 DIAGNOSIS — J449 Chronic obstructive pulmonary disease, unspecified: Secondary | ICD-10-CM | POA: Diagnosis not present

## 2019-10-28 DIAGNOSIS — E039 Hypothyroidism, unspecified: Secondary | ICD-10-CM | POA: Diagnosis not present

## 2019-10-28 DIAGNOSIS — Z23 Encounter for immunization: Secondary | ICD-10-CM | POA: Diagnosis not present

## 2019-10-28 NOTE — Patient Outreach (Signed)
Telephone assessment: New referral Discharged from [facility] on 10/22/2019.  Primary MD office does transition of care:  Placed call to patient and reviewed reason for call. Patient answered the phone and identified herself. Reports she is doing very well. Reports no new falls since discharge. Reports she has all her medication and is taking them . Reports she has follow up planned with primary MD today and will review her medications.  Patient denies any needs at this time but is willing for me to mail her a letter and she will call me if needed in the future.  PLAN: Will mail successful outreach letter. Will close case as patient declines needs at this time.  Tomasa Rand, RN, BSN, CEN [facility] [employer] [phone]

## 2019-10-31 DIAGNOSIS — M80011D Age-related osteoporosis with current pathological fracture, right shoulder, subsequent encounter for fracture with routine healing: Secondary | ICD-10-CM | POA: Diagnosis not present

## 2019-10-31 DIAGNOSIS — I130 Hypertensive heart and chronic kidney disease with heart failure and stage 1 through stage 4 chronic kidney disease, or unspecified chronic kidney disease: Secondary | ICD-10-CM | POA: Diagnosis not present

## 2019-10-31 DIAGNOSIS — N189 Chronic kidney disease, unspecified: Secondary | ICD-10-CM | POA: Diagnosis not present

## 2019-10-31 DIAGNOSIS — M109 Gout, unspecified: Secondary | ICD-10-CM | POA: Diagnosis not present

## 2019-10-31 DIAGNOSIS — S81822D Laceration with foreign body, left lower leg, subsequent encounter: Secondary | ICD-10-CM | POA: Diagnosis not present

## 2019-10-31 DIAGNOSIS — I509 Heart failure, unspecified: Secondary | ICD-10-CM | POA: Diagnosis not present

## 2019-11-03 ENCOUNTER — Other Ambulatory Visit: Payer: Self-pay | Admitting: Cardiology

## 2019-11-04 DIAGNOSIS — I10 Essential (primary) hypertension: Secondary | ICD-10-CM | POA: Diagnosis not present

## 2019-11-07 DIAGNOSIS — I130 Hypertensive heart and chronic kidney disease with heart failure and stage 1 through stage 4 chronic kidney disease, or unspecified chronic kidney disease: Secondary | ICD-10-CM | POA: Diagnosis not present

## 2019-11-07 DIAGNOSIS — I509 Heart failure, unspecified: Secondary | ICD-10-CM | POA: Diagnosis not present

## 2019-11-07 DIAGNOSIS — M109 Gout, unspecified: Secondary | ICD-10-CM | POA: Diagnosis not present

## 2019-11-07 DIAGNOSIS — M80011D Age-related osteoporosis with current pathological fracture, right shoulder, subsequent encounter for fracture with routine healing: Secondary | ICD-10-CM | POA: Diagnosis not present

## 2019-11-07 DIAGNOSIS — S81822D Laceration with foreign body, left lower leg, subsequent encounter: Secondary | ICD-10-CM | POA: Diagnosis not present

## 2019-11-07 DIAGNOSIS — N189 Chronic kidney disease, unspecified: Secondary | ICD-10-CM | POA: Diagnosis not present

## 2019-11-12 ENCOUNTER — Other Ambulatory Visit: Payer: Self-pay | Admitting: Cardiology

## 2019-11-12 NOTE — Telephone Encounter (Signed)
Can we fill this?

## 2019-11-13 DIAGNOSIS — I130 Hypertensive heart and chronic kidney disease with heart failure and stage 1 through stage 4 chronic kidney disease, or unspecified chronic kidney disease: Secondary | ICD-10-CM | POA: Diagnosis not present

## 2019-11-13 DIAGNOSIS — N189 Chronic kidney disease, unspecified: Secondary | ICD-10-CM | POA: Diagnosis not present

## 2019-11-13 DIAGNOSIS — I509 Heart failure, unspecified: Secondary | ICD-10-CM | POA: Diagnosis not present

## 2019-11-13 DIAGNOSIS — M80011D Age-related osteoporosis with current pathological fracture, right shoulder, subsequent encounter for fracture with routine healing: Secondary | ICD-10-CM | POA: Diagnosis not present

## 2019-11-13 DIAGNOSIS — M109 Gout, unspecified: Secondary | ICD-10-CM | POA: Diagnosis not present

## 2019-11-13 DIAGNOSIS — S81822D Laceration with foreign body, left lower leg, subsequent encounter: Secondary | ICD-10-CM | POA: Diagnosis not present

## 2019-11-21 DIAGNOSIS — H43393 Other vitreous opacities, bilateral: Secondary | ICD-10-CM | POA: Diagnosis not present

## 2019-11-21 DIAGNOSIS — H40033 Anatomical narrow angle, bilateral: Secondary | ICD-10-CM | POA: Diagnosis not present

## 2019-11-22 DIAGNOSIS — I509 Heart failure, unspecified: Secondary | ICD-10-CM | POA: Diagnosis not present

## 2019-11-22 DIAGNOSIS — Z950 Presence of cardiac pacemaker: Secondary | ICD-10-CM | POA: Diagnosis not present

## 2019-11-22 DIAGNOSIS — M80011D Age-related osteoporosis with current pathological fracture, right shoulder, subsequent encounter for fracture with routine healing: Secondary | ICD-10-CM | POA: Diagnosis not present

## 2019-11-22 DIAGNOSIS — K5900 Constipation, unspecified: Secondary | ICD-10-CM | POA: Diagnosis not present

## 2019-11-22 DIAGNOSIS — M109 Gout, unspecified: Secondary | ICD-10-CM | POA: Diagnosis not present

## 2019-11-22 DIAGNOSIS — I480 Paroxysmal atrial fibrillation: Secondary | ICD-10-CM | POA: Diagnosis not present

## 2019-11-22 DIAGNOSIS — N189 Chronic kidney disease, unspecified: Secondary | ICD-10-CM | POA: Diagnosis not present

## 2019-11-22 DIAGNOSIS — Z853 Personal history of malignant neoplasm of breast: Secondary | ICD-10-CM | POA: Diagnosis not present

## 2019-11-22 DIAGNOSIS — G9340 Encephalopathy, unspecified: Secondary | ICD-10-CM | POA: Diagnosis not present

## 2019-11-22 DIAGNOSIS — S81822D Laceration with foreign body, left lower leg, subsequent encounter: Secondary | ICD-10-CM | POA: Diagnosis not present

## 2019-11-22 DIAGNOSIS — G629 Polyneuropathy, unspecified: Secondary | ICD-10-CM | POA: Diagnosis not present

## 2019-11-22 DIAGNOSIS — M150 Primary generalized (osteo)arthritis: Secondary | ICD-10-CM | POA: Diagnosis not present

## 2019-11-22 DIAGNOSIS — E46 Unspecified protein-calorie malnutrition: Secondary | ICD-10-CM | POA: Diagnosis not present

## 2019-11-22 DIAGNOSIS — J45909 Unspecified asthma, uncomplicated: Secondary | ICD-10-CM | POA: Diagnosis not present

## 2019-11-22 DIAGNOSIS — Z7901 Long term (current) use of anticoagulants: Secondary | ICD-10-CM | POA: Diagnosis not present

## 2019-11-22 DIAGNOSIS — Z89611 Acquired absence of right leg above knee: Secondary | ICD-10-CM | POA: Diagnosis not present

## 2019-11-22 DIAGNOSIS — I130 Hypertensive heart and chronic kidney disease with heart failure and stage 1 through stage 4 chronic kidney disease, or unspecified chronic kidney disease: Secondary | ICD-10-CM | POA: Diagnosis not present

## 2019-11-22 DIAGNOSIS — M810 Age-related osteoporosis without current pathological fracture: Secondary | ICD-10-CM | POA: Diagnosis not present

## 2019-11-22 DIAGNOSIS — R296 Repeated falls: Secondary | ICD-10-CM | POA: Diagnosis not present

## 2019-11-22 DIAGNOSIS — Z96653 Presence of artificial knee joint, bilateral: Secondary | ICD-10-CM | POA: Diagnosis not present

## 2019-11-22 DIAGNOSIS — E039 Hypothyroidism, unspecified: Secondary | ICD-10-CM | POA: Diagnosis not present

## 2019-11-22 DIAGNOSIS — Z9181 History of falling: Secondary | ICD-10-CM | POA: Diagnosis not present

## 2019-11-28 DIAGNOSIS — I509 Heart failure, unspecified: Secondary | ICD-10-CM | POA: Diagnosis not present

## 2019-11-28 DIAGNOSIS — M80011D Age-related osteoporosis with current pathological fracture, right shoulder, subsequent encounter for fracture with routine healing: Secondary | ICD-10-CM | POA: Diagnosis not present

## 2019-11-28 DIAGNOSIS — S81822D Laceration with foreign body, left lower leg, subsequent encounter: Secondary | ICD-10-CM | POA: Diagnosis not present

## 2019-11-28 DIAGNOSIS — N189 Chronic kidney disease, unspecified: Secondary | ICD-10-CM | POA: Diagnosis not present

## 2019-11-28 DIAGNOSIS — I130 Hypertensive heart and chronic kidney disease with heart failure and stage 1 through stage 4 chronic kidney disease, or unspecified chronic kidney disease: Secondary | ICD-10-CM | POA: Diagnosis not present

## 2019-11-28 DIAGNOSIS — M109 Gout, unspecified: Secondary | ICD-10-CM | POA: Diagnosis not present

## 2019-12-05 DIAGNOSIS — N189 Chronic kidney disease, unspecified: Secondary | ICD-10-CM | POA: Diagnosis not present

## 2019-12-05 DIAGNOSIS — S81822D Laceration with foreign body, left lower leg, subsequent encounter: Secondary | ICD-10-CM | POA: Diagnosis not present

## 2019-12-05 DIAGNOSIS — I509 Heart failure, unspecified: Secondary | ICD-10-CM | POA: Diagnosis not present

## 2019-12-05 DIAGNOSIS — I130 Hypertensive heart and chronic kidney disease with heart failure and stage 1 through stage 4 chronic kidney disease, or unspecified chronic kidney disease: Secondary | ICD-10-CM | POA: Diagnosis not present

## 2019-12-05 DIAGNOSIS — M80011D Age-related osteoporosis with current pathological fracture, right shoulder, subsequent encounter for fracture with routine healing: Secondary | ICD-10-CM | POA: Diagnosis not present

## 2019-12-05 DIAGNOSIS — M109 Gout, unspecified: Secondary | ICD-10-CM | POA: Diagnosis not present

## 2019-12-11 DIAGNOSIS — N189 Chronic kidney disease, unspecified: Secondary | ICD-10-CM | POA: Diagnosis not present

## 2019-12-11 DIAGNOSIS — I509 Heart failure, unspecified: Secondary | ICD-10-CM | POA: Diagnosis not present

## 2019-12-11 DIAGNOSIS — M80011D Age-related osteoporosis with current pathological fracture, right shoulder, subsequent encounter for fracture with routine healing: Secondary | ICD-10-CM | POA: Diagnosis not present

## 2019-12-11 DIAGNOSIS — S81822D Laceration with foreign body, left lower leg, subsequent encounter: Secondary | ICD-10-CM | POA: Diagnosis not present

## 2019-12-11 DIAGNOSIS — I130 Hypertensive heart and chronic kidney disease with heart failure and stage 1 through stage 4 chronic kidney disease, or unspecified chronic kidney disease: Secondary | ICD-10-CM | POA: Diagnosis not present

## 2019-12-11 DIAGNOSIS — M109 Gout, unspecified: Secondary | ICD-10-CM | POA: Diagnosis not present

## 2019-12-19 DIAGNOSIS — I130 Hypertensive heart and chronic kidney disease with heart failure and stage 1 through stage 4 chronic kidney disease, or unspecified chronic kidney disease: Secondary | ICD-10-CM | POA: Diagnosis not present

## 2019-12-19 DIAGNOSIS — I509 Heart failure, unspecified: Secondary | ICD-10-CM | POA: Diagnosis not present

## 2019-12-19 DIAGNOSIS — S81822D Laceration with foreign body, left lower leg, subsequent encounter: Secondary | ICD-10-CM | POA: Diagnosis not present

## 2019-12-19 DIAGNOSIS — M109 Gout, unspecified: Secondary | ICD-10-CM | POA: Diagnosis not present

## 2019-12-19 DIAGNOSIS — N189 Chronic kidney disease, unspecified: Secondary | ICD-10-CM | POA: Diagnosis not present

## 2019-12-19 DIAGNOSIS — M80011D Age-related osteoporosis with current pathological fracture, right shoulder, subsequent encounter for fracture with routine healing: Secondary | ICD-10-CM | POA: Diagnosis not present

## 2019-12-22 DIAGNOSIS — S81822D Laceration with foreign body, left lower leg, subsequent encounter: Secondary | ICD-10-CM | POA: Diagnosis not present

## 2019-12-22 DIAGNOSIS — I509 Heart failure, unspecified: Secondary | ICD-10-CM | POA: Diagnosis not present

## 2019-12-22 DIAGNOSIS — G9340 Encephalopathy, unspecified: Secondary | ICD-10-CM | POA: Diagnosis not present

## 2019-12-22 DIAGNOSIS — M109 Gout, unspecified: Secondary | ICD-10-CM | POA: Diagnosis not present

## 2019-12-22 DIAGNOSIS — Z7901 Long term (current) use of anticoagulants: Secondary | ICD-10-CM | POA: Diagnosis not present

## 2019-12-22 DIAGNOSIS — E46 Unspecified protein-calorie malnutrition: Secondary | ICD-10-CM | POA: Diagnosis not present

## 2019-12-22 DIAGNOSIS — E039 Hypothyroidism, unspecified: Secondary | ICD-10-CM | POA: Diagnosis not present

## 2019-12-22 DIAGNOSIS — I130 Hypertensive heart and chronic kidney disease with heart failure and stage 1 through stage 4 chronic kidney disease, or unspecified chronic kidney disease: Secondary | ICD-10-CM | POA: Diagnosis not present

## 2019-12-22 DIAGNOSIS — I480 Paroxysmal atrial fibrillation: Secondary | ICD-10-CM | POA: Diagnosis not present

## 2019-12-22 DIAGNOSIS — M810 Age-related osteoporosis without current pathological fracture: Secondary | ICD-10-CM | POA: Diagnosis not present

## 2019-12-22 DIAGNOSIS — K5900 Constipation, unspecified: Secondary | ICD-10-CM | POA: Diagnosis not present

## 2019-12-22 DIAGNOSIS — R296 Repeated falls: Secondary | ICD-10-CM | POA: Diagnosis not present

## 2019-12-22 DIAGNOSIS — Z89611 Acquired absence of right leg above knee: Secondary | ICD-10-CM | POA: Diagnosis not present

## 2019-12-22 DIAGNOSIS — Z950 Presence of cardiac pacemaker: Secondary | ICD-10-CM | POA: Diagnosis not present

## 2019-12-22 DIAGNOSIS — M150 Primary generalized (osteo)arthritis: Secondary | ICD-10-CM | POA: Diagnosis not present

## 2019-12-22 DIAGNOSIS — Z9181 History of falling: Secondary | ICD-10-CM | POA: Diagnosis not present

## 2019-12-22 DIAGNOSIS — G629 Polyneuropathy, unspecified: Secondary | ICD-10-CM | POA: Diagnosis not present

## 2019-12-22 DIAGNOSIS — Z853 Personal history of malignant neoplasm of breast: Secondary | ICD-10-CM | POA: Diagnosis not present

## 2019-12-22 DIAGNOSIS — J45909 Unspecified asthma, uncomplicated: Secondary | ICD-10-CM | POA: Diagnosis not present

## 2019-12-22 DIAGNOSIS — Z96653 Presence of artificial knee joint, bilateral: Secondary | ICD-10-CM | POA: Diagnosis not present

## 2019-12-22 DIAGNOSIS — N189 Chronic kidney disease, unspecified: Secondary | ICD-10-CM | POA: Diagnosis not present

## 2019-12-22 DIAGNOSIS — M80011D Age-related osteoporosis with current pathological fracture, right shoulder, subsequent encounter for fracture with routine healing: Secondary | ICD-10-CM | POA: Diagnosis not present

## 2019-12-25 DIAGNOSIS — I5042 Chronic combined systolic (congestive) and diastolic (congestive) heart failure: Secondary | ICD-10-CM

## 2019-12-25 DIAGNOSIS — Z950 Presence of cardiac pacemaker: Secondary | ICD-10-CM

## 2019-12-25 DIAGNOSIS — Z45018 Encounter for adjustment and management of other part of cardiac pacemaker: Secondary | ICD-10-CM

## 2019-12-25 DIAGNOSIS — I442 Atrioventricular block, complete: Secondary | ICD-10-CM

## 2019-12-26 DIAGNOSIS — M109 Gout, unspecified: Secondary | ICD-10-CM | POA: Diagnosis not present

## 2019-12-26 DIAGNOSIS — N189 Chronic kidney disease, unspecified: Secondary | ICD-10-CM | POA: Diagnosis not present

## 2019-12-26 DIAGNOSIS — I509 Heart failure, unspecified: Secondary | ICD-10-CM | POA: Diagnosis not present

## 2019-12-26 DIAGNOSIS — S81822D Laceration with foreign body, left lower leg, subsequent encounter: Secondary | ICD-10-CM | POA: Diagnosis not present

## 2019-12-26 DIAGNOSIS — M80011D Age-related osteoporosis with current pathological fracture, right shoulder, subsequent encounter for fracture with routine healing: Secondary | ICD-10-CM | POA: Diagnosis not present

## 2019-12-26 DIAGNOSIS — I130 Hypertensive heart and chronic kidney disease with heart failure and stage 1 through stage 4 chronic kidney disease, or unspecified chronic kidney disease: Secondary | ICD-10-CM | POA: Diagnosis not present

## 2020-01-01 ENCOUNTER — Telehealth: Payer: Self-pay

## 2020-01-01 NOTE — Telephone Encounter (Signed)
LVM with details.

## 2020-01-01 NOTE — Telephone Encounter (Signed)
-----   Message from Adrian Prows, MD sent at 12/28/2019  9:03 PM EST ----- Regarding: Pacemaker Normal function.  JG

## 2020-01-02 DIAGNOSIS — S81822D Laceration with foreign body, left lower leg, subsequent encounter: Secondary | ICD-10-CM | POA: Diagnosis not present

## 2020-01-02 DIAGNOSIS — M109 Gout, unspecified: Secondary | ICD-10-CM | POA: Diagnosis not present

## 2020-01-02 DIAGNOSIS — M80011D Age-related osteoporosis with current pathological fracture, right shoulder, subsequent encounter for fracture with routine healing: Secondary | ICD-10-CM | POA: Diagnosis not present

## 2020-01-02 DIAGNOSIS — I130 Hypertensive heart and chronic kidney disease with heart failure and stage 1 through stage 4 chronic kidney disease, or unspecified chronic kidney disease: Secondary | ICD-10-CM | POA: Diagnosis not present

## 2020-01-02 DIAGNOSIS — N189 Chronic kidney disease, unspecified: Secondary | ICD-10-CM | POA: Diagnosis not present

## 2020-01-02 DIAGNOSIS — I509 Heart failure, unspecified: Secondary | ICD-10-CM | POA: Diagnosis not present

## 2020-01-06 DIAGNOSIS — M80011D Age-related osteoporosis with current pathological fracture, right shoulder, subsequent encounter for fracture with routine healing: Secondary | ICD-10-CM | POA: Diagnosis not present

## 2020-01-09 DIAGNOSIS — N189 Chronic kidney disease, unspecified: Secondary | ICD-10-CM | POA: Diagnosis not present

## 2020-01-09 DIAGNOSIS — S81822D Laceration with foreign body, left lower leg, subsequent encounter: Secondary | ICD-10-CM | POA: Diagnosis not present

## 2020-01-09 DIAGNOSIS — M80011D Age-related osteoporosis with current pathological fracture, right shoulder, subsequent encounter for fracture with routine healing: Secondary | ICD-10-CM | POA: Diagnosis not present

## 2020-01-09 DIAGNOSIS — I130 Hypertensive heart and chronic kidney disease with heart failure and stage 1 through stage 4 chronic kidney disease, or unspecified chronic kidney disease: Secondary | ICD-10-CM | POA: Diagnosis not present

## 2020-01-09 DIAGNOSIS — I509 Heart failure, unspecified: Secondary | ICD-10-CM | POA: Diagnosis not present

## 2020-01-09 DIAGNOSIS — M109 Gout, unspecified: Secondary | ICD-10-CM | POA: Diagnosis not present

## 2020-01-16 DIAGNOSIS — N189 Chronic kidney disease, unspecified: Secondary | ICD-10-CM | POA: Diagnosis not present

## 2020-01-16 DIAGNOSIS — I509 Heart failure, unspecified: Secondary | ICD-10-CM | POA: Diagnosis not present

## 2020-01-16 DIAGNOSIS — I130 Hypertensive heart and chronic kidney disease with heart failure and stage 1 through stage 4 chronic kidney disease, or unspecified chronic kidney disease: Secondary | ICD-10-CM | POA: Diagnosis not present

## 2020-01-16 DIAGNOSIS — M80011D Age-related osteoporosis with current pathological fracture, right shoulder, subsequent encounter for fracture with routine healing: Secondary | ICD-10-CM | POA: Diagnosis not present

## 2020-01-16 DIAGNOSIS — S81822D Laceration with foreign body, left lower leg, subsequent encounter: Secondary | ICD-10-CM | POA: Diagnosis not present

## 2020-01-16 DIAGNOSIS — M109 Gout, unspecified: Secondary | ICD-10-CM | POA: Diagnosis not present

## 2020-01-21 DIAGNOSIS — M810 Age-related osteoporosis without current pathological fracture: Secondary | ICD-10-CM | POA: Diagnosis not present

## 2020-01-21 DIAGNOSIS — Z7901 Long term (current) use of anticoagulants: Secondary | ICD-10-CM | POA: Diagnosis not present

## 2020-01-21 DIAGNOSIS — G629 Polyneuropathy, unspecified: Secondary | ICD-10-CM | POA: Diagnosis not present

## 2020-01-21 DIAGNOSIS — I480 Paroxysmal atrial fibrillation: Secondary | ICD-10-CM | POA: Diagnosis not present

## 2020-01-21 DIAGNOSIS — Z9181 History of falling: Secondary | ICD-10-CM | POA: Diagnosis not present

## 2020-01-21 DIAGNOSIS — Z96653 Presence of artificial knee joint, bilateral: Secondary | ICD-10-CM | POA: Diagnosis not present

## 2020-01-21 DIAGNOSIS — Z950 Presence of cardiac pacemaker: Secondary | ICD-10-CM | POA: Diagnosis not present

## 2020-01-21 DIAGNOSIS — S81822D Laceration with foreign body, left lower leg, subsequent encounter: Secondary | ICD-10-CM | POA: Diagnosis not present

## 2020-01-21 DIAGNOSIS — E039 Hypothyroidism, unspecified: Secondary | ICD-10-CM | POA: Diagnosis not present

## 2020-01-21 DIAGNOSIS — M150 Primary generalized (osteo)arthritis: Secondary | ICD-10-CM | POA: Diagnosis not present

## 2020-01-21 DIAGNOSIS — R296 Repeated falls: Secondary | ICD-10-CM | POA: Diagnosis not present

## 2020-01-21 DIAGNOSIS — N189 Chronic kidney disease, unspecified: Secondary | ICD-10-CM | POA: Diagnosis not present

## 2020-01-21 DIAGNOSIS — E46 Unspecified protein-calorie malnutrition: Secondary | ICD-10-CM | POA: Diagnosis not present

## 2020-01-21 DIAGNOSIS — I509 Heart failure, unspecified: Secondary | ICD-10-CM | POA: Diagnosis not present

## 2020-01-21 DIAGNOSIS — J45909 Unspecified asthma, uncomplicated: Secondary | ICD-10-CM | POA: Diagnosis not present

## 2020-01-21 DIAGNOSIS — I130 Hypertensive heart and chronic kidney disease with heart failure and stage 1 through stage 4 chronic kidney disease, or unspecified chronic kidney disease: Secondary | ICD-10-CM | POA: Diagnosis not present

## 2020-01-21 DIAGNOSIS — K5900 Constipation, unspecified: Secondary | ICD-10-CM | POA: Diagnosis not present

## 2020-01-21 DIAGNOSIS — M109 Gout, unspecified: Secondary | ICD-10-CM | POA: Diagnosis not present

## 2020-01-21 DIAGNOSIS — G9340 Encephalopathy, unspecified: Secondary | ICD-10-CM | POA: Diagnosis not present

## 2020-01-21 DIAGNOSIS — Z853 Personal history of malignant neoplasm of breast: Secondary | ICD-10-CM | POA: Diagnosis not present

## 2020-01-21 DIAGNOSIS — Z89611 Acquired absence of right leg above knee: Secondary | ICD-10-CM | POA: Diagnosis not present

## 2020-01-21 DIAGNOSIS — M80011D Age-related osteoporosis with current pathological fracture, right shoulder, subsequent encounter for fracture with routine healing: Secondary | ICD-10-CM | POA: Diagnosis not present

## 2020-01-23 DIAGNOSIS — I130 Hypertensive heart and chronic kidney disease with heart failure and stage 1 through stage 4 chronic kidney disease, or unspecified chronic kidney disease: Secondary | ICD-10-CM | POA: Diagnosis not present

## 2020-01-23 DIAGNOSIS — M109 Gout, unspecified: Secondary | ICD-10-CM | POA: Diagnosis not present

## 2020-01-23 DIAGNOSIS — I509 Heart failure, unspecified: Secondary | ICD-10-CM | POA: Diagnosis not present

## 2020-01-23 DIAGNOSIS — M80011D Age-related osteoporosis with current pathological fracture, right shoulder, subsequent encounter for fracture with routine healing: Secondary | ICD-10-CM | POA: Diagnosis not present

## 2020-01-23 DIAGNOSIS — N189 Chronic kidney disease, unspecified: Secondary | ICD-10-CM | POA: Diagnosis not present

## 2020-01-23 DIAGNOSIS — S81822D Laceration with foreign body, left lower leg, subsequent encounter: Secondary | ICD-10-CM | POA: Diagnosis not present

## 2020-01-27 DIAGNOSIS — I130 Hypertensive heart and chronic kidney disease with heart failure and stage 1 through stage 4 chronic kidney disease, or unspecified chronic kidney disease: Secondary | ICD-10-CM | POA: Diagnosis not present

## 2020-01-27 DIAGNOSIS — S81822D Laceration with foreign body, left lower leg, subsequent encounter: Secondary | ICD-10-CM | POA: Diagnosis not present

## 2020-01-27 DIAGNOSIS — M80011D Age-related osteoporosis with current pathological fracture, right shoulder, subsequent encounter for fracture with routine healing: Secondary | ICD-10-CM | POA: Diagnosis not present

## 2020-01-27 DIAGNOSIS — I509 Heart failure, unspecified: Secondary | ICD-10-CM | POA: Diagnosis not present

## 2020-01-27 DIAGNOSIS — M109 Gout, unspecified: Secondary | ICD-10-CM | POA: Diagnosis not present

## 2020-01-27 DIAGNOSIS — N189 Chronic kidney disease, unspecified: Secondary | ICD-10-CM | POA: Diagnosis not present

## 2020-01-30 DIAGNOSIS — S81822D Laceration with foreign body, left lower leg, subsequent encounter: Secondary | ICD-10-CM | POA: Diagnosis not present

## 2020-01-30 DIAGNOSIS — I130 Hypertensive heart and chronic kidney disease with heart failure and stage 1 through stage 4 chronic kidney disease, or unspecified chronic kidney disease: Secondary | ICD-10-CM | POA: Diagnosis not present

## 2020-01-30 DIAGNOSIS — M80011D Age-related osteoporosis with current pathological fracture, right shoulder, subsequent encounter for fracture with routine healing: Secondary | ICD-10-CM | POA: Diagnosis not present

## 2020-01-30 DIAGNOSIS — I509 Heart failure, unspecified: Secondary | ICD-10-CM | POA: Diagnosis not present

## 2020-01-30 DIAGNOSIS — M109 Gout, unspecified: Secondary | ICD-10-CM | POA: Diagnosis not present

## 2020-01-30 DIAGNOSIS — N189 Chronic kidney disease, unspecified: Secondary | ICD-10-CM | POA: Diagnosis not present

## 2020-01-30 DIAGNOSIS — E039 Hypothyroidism, unspecified: Secondary | ICD-10-CM | POA: Diagnosis not present

## 2020-01-30 DIAGNOSIS — G629 Polyneuropathy, unspecified: Secondary | ICD-10-CM | POA: Diagnosis not present

## 2020-02-03 DIAGNOSIS — S81822D Laceration with foreign body, left lower leg, subsequent encounter: Secondary | ICD-10-CM | POA: Diagnosis not present

## 2020-02-03 DIAGNOSIS — N189 Chronic kidney disease, unspecified: Secondary | ICD-10-CM | POA: Diagnosis not present

## 2020-02-03 DIAGNOSIS — M80011D Age-related osteoporosis with current pathological fracture, right shoulder, subsequent encounter for fracture with routine healing: Secondary | ICD-10-CM | POA: Diagnosis not present

## 2020-02-03 DIAGNOSIS — I509 Heart failure, unspecified: Secondary | ICD-10-CM | POA: Diagnosis not present

## 2020-02-03 DIAGNOSIS — I130 Hypertensive heart and chronic kidney disease with heart failure and stage 1 through stage 4 chronic kidney disease, or unspecified chronic kidney disease: Secondary | ICD-10-CM | POA: Diagnosis not present

## 2020-02-03 DIAGNOSIS — M109 Gout, unspecified: Secondary | ICD-10-CM | POA: Diagnosis not present

## 2020-02-04 DIAGNOSIS — N3001 Acute cystitis with hematuria: Secondary | ICD-10-CM | POA: Diagnosis not present

## 2020-02-04 DIAGNOSIS — N182 Chronic kidney disease, stage 2 (mild): Secondary | ICD-10-CM | POA: Diagnosis not present

## 2020-02-04 DIAGNOSIS — D649 Anemia, unspecified: Secondary | ICD-10-CM | POA: Diagnosis not present

## 2020-02-04 DIAGNOSIS — I272 Pulmonary hypertension, unspecified: Secondary | ICD-10-CM | POA: Diagnosis not present

## 2020-02-04 DIAGNOSIS — K219 Gastro-esophageal reflux disease without esophagitis: Secondary | ICD-10-CM | POA: Diagnosis not present

## 2020-02-04 DIAGNOSIS — E785 Hyperlipidemia, unspecified: Secondary | ICD-10-CM | POA: Diagnosis not present

## 2020-02-04 DIAGNOSIS — I4891 Unspecified atrial fibrillation: Secondary | ICD-10-CM | POA: Diagnosis not present

## 2020-02-04 DIAGNOSIS — I340 Nonrheumatic mitral (valve) insufficiency: Secondary | ICD-10-CM | POA: Diagnosis not present

## 2020-02-04 DIAGNOSIS — N390 Urinary tract infection, site not specified: Secondary | ICD-10-CM | POA: Diagnosis not present

## 2020-02-04 DIAGNOSIS — J449 Chronic obstructive pulmonary disease, unspecified: Secondary | ICD-10-CM | POA: Diagnosis not present

## 2020-02-04 DIAGNOSIS — E039 Hypothyroidism, unspecified: Secondary | ICD-10-CM | POA: Diagnosis not present

## 2020-02-04 DIAGNOSIS — I10 Essential (primary) hypertension: Secondary | ICD-10-CM | POA: Diagnosis not present

## 2020-02-05 IMAGING — DX DG ELBOW 2V*R*
3 series · 3 of 3 positions shown · non-contrast
Comparison: 04/06/2018.

CLINICAL DATA: Operative fixation of a comminuted olecranon
fracture.

EXAM:
RIGHT ELBOW - 2 VIEW

[elbow ap (1 of 2)]
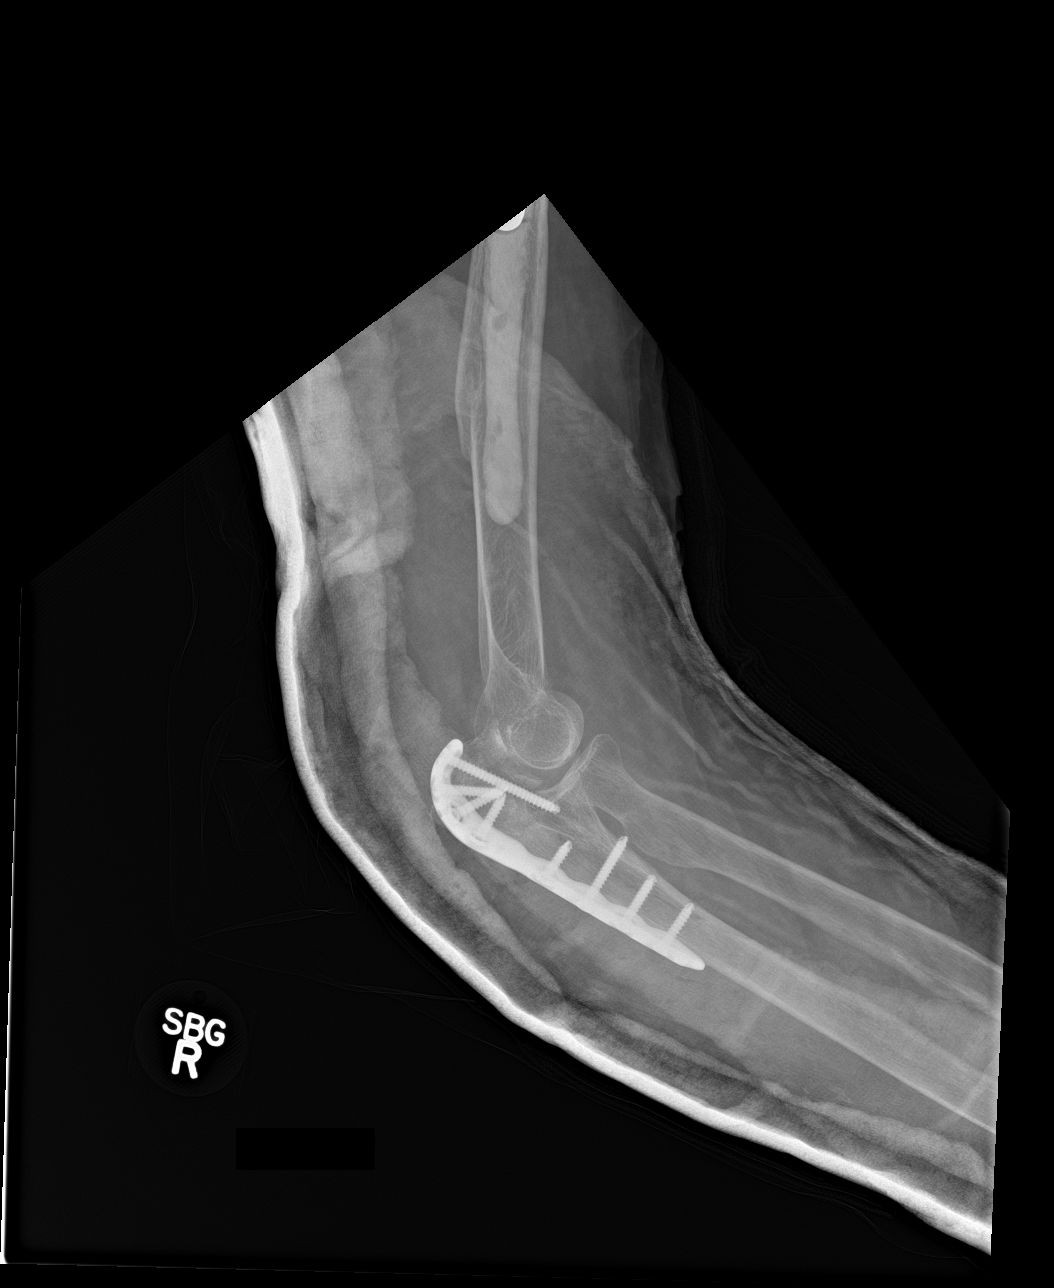

[elbow lat]
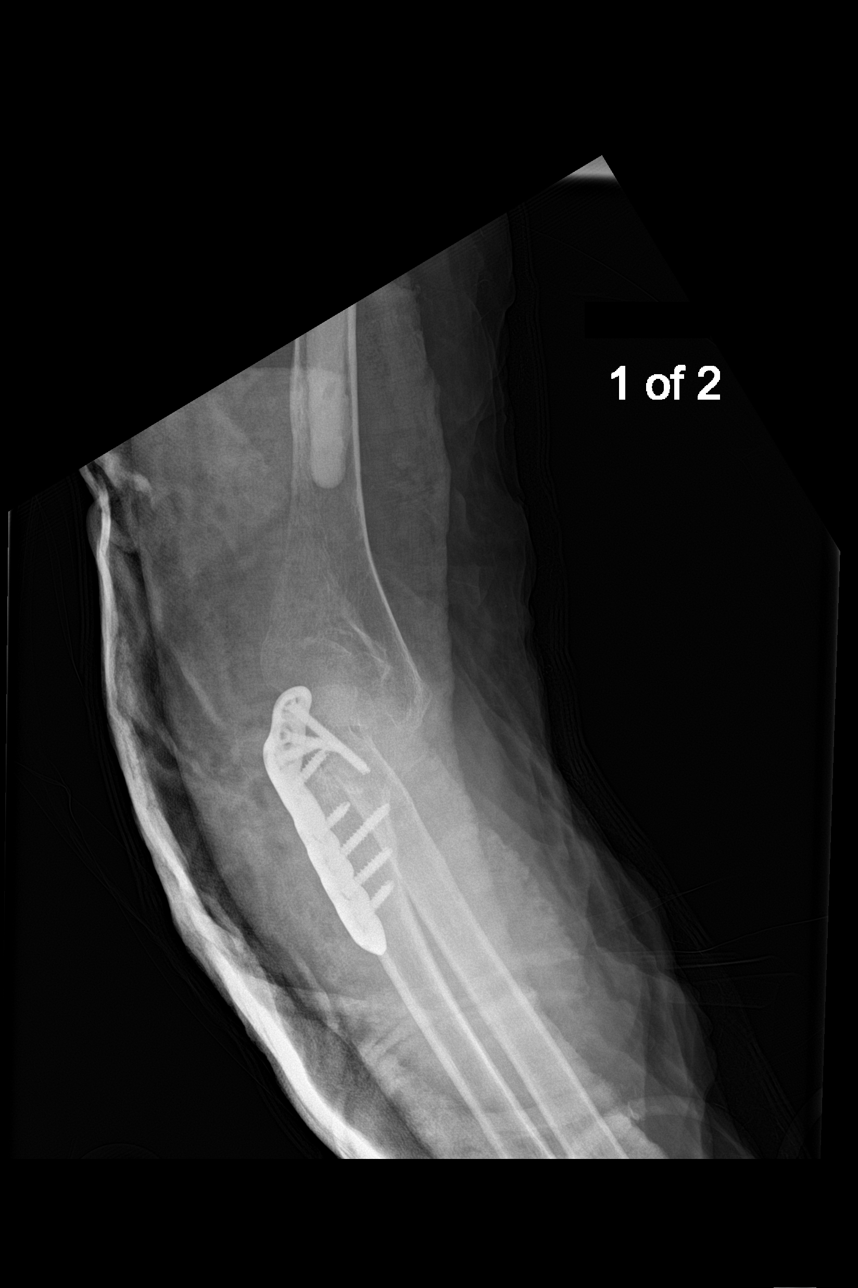

[elbow ap (2 of 2)]
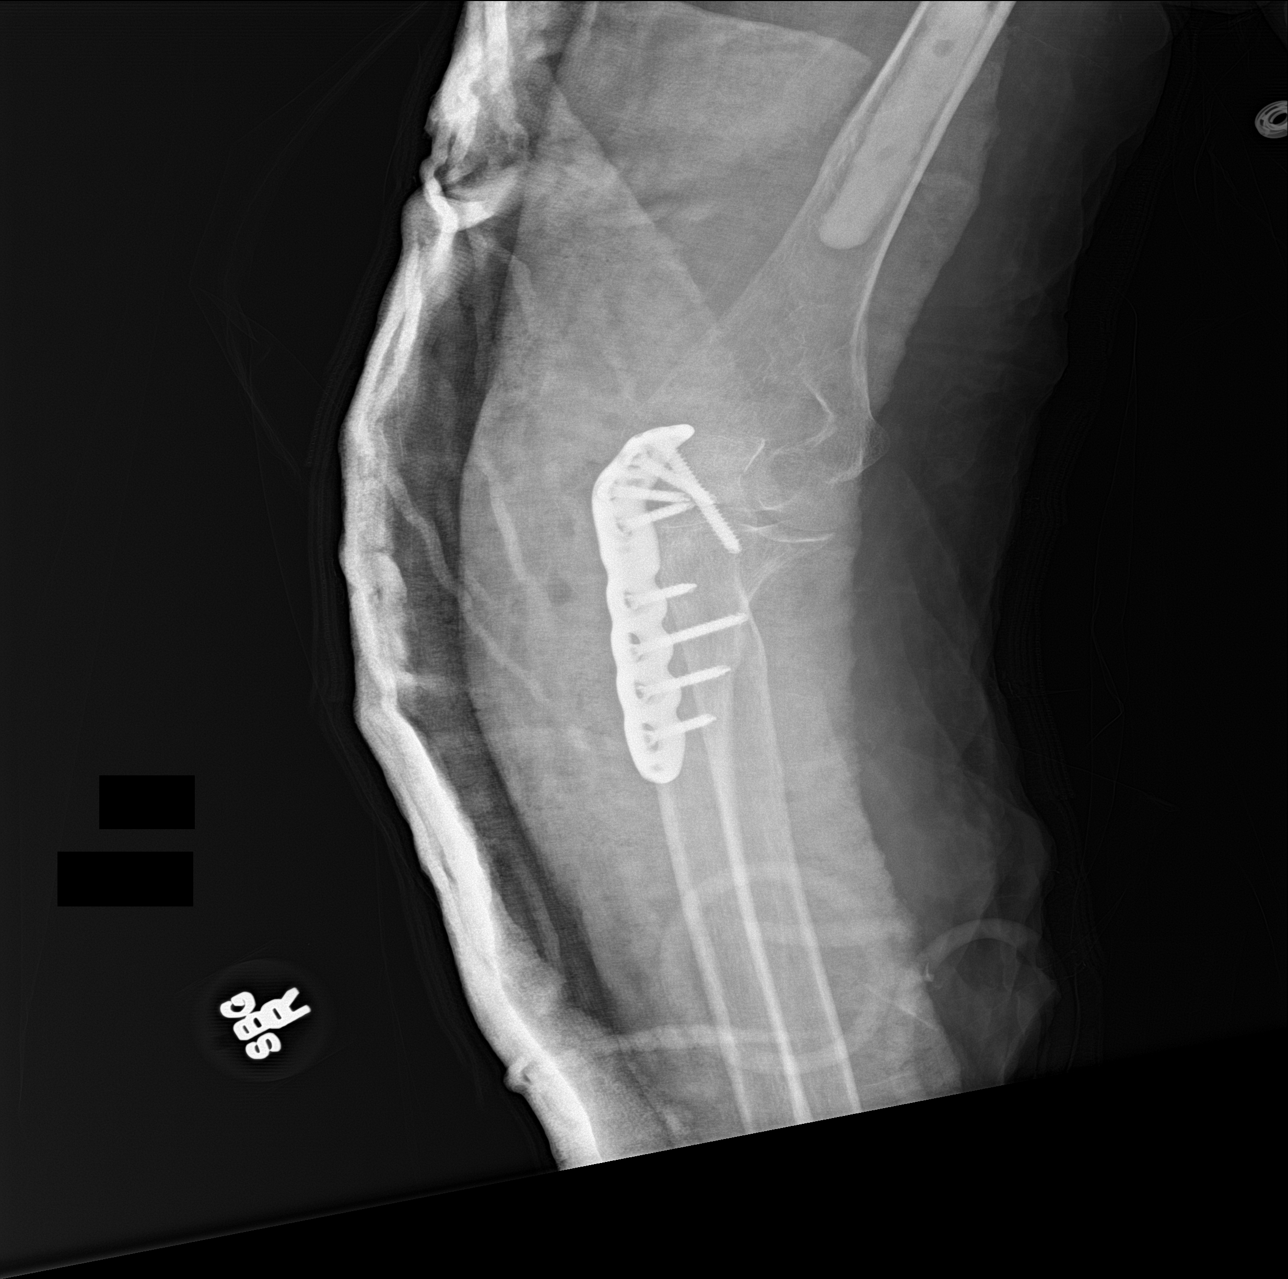

[3 of 3 positions shown; findings below may reference images not displayed]

FINDINGS: Interval screw and plate fixation of the previously demonstrated
comminuted olecranon fracture. There is some residual displacement
of the fragments at the joint space. Previously noted old, healed
distal right humerus fracture with fixation hardware and cement.
Diffuse osteopenia.
IMPRESSION: Screw plate fixation of the previously demonstrated comminuted
olecranon fracture with significantly improved position and
alignment.

## 2020-02-06 DIAGNOSIS — M109 Gout, unspecified: Secondary | ICD-10-CM | POA: Diagnosis not present

## 2020-02-06 DIAGNOSIS — M80011D Age-related osteoporosis with current pathological fracture, right shoulder, subsequent encounter for fracture with routine healing: Secondary | ICD-10-CM | POA: Diagnosis not present

## 2020-02-06 DIAGNOSIS — S81822D Laceration with foreign body, left lower leg, subsequent encounter: Secondary | ICD-10-CM | POA: Diagnosis not present

## 2020-02-06 DIAGNOSIS — I130 Hypertensive heart and chronic kidney disease with heart failure and stage 1 through stage 4 chronic kidney disease, or unspecified chronic kidney disease: Secondary | ICD-10-CM | POA: Diagnosis not present

## 2020-02-06 DIAGNOSIS — N189 Chronic kidney disease, unspecified: Secondary | ICD-10-CM | POA: Diagnosis not present

## 2020-02-06 DIAGNOSIS — I509 Heart failure, unspecified: Secondary | ICD-10-CM | POA: Diagnosis not present

## 2020-02-10 DIAGNOSIS — I130 Hypertensive heart and chronic kidney disease with heart failure and stage 1 through stage 4 chronic kidney disease, or unspecified chronic kidney disease: Secondary | ICD-10-CM | POA: Diagnosis not present

## 2020-02-10 DIAGNOSIS — S81822D Laceration with foreign body, left lower leg, subsequent encounter: Secondary | ICD-10-CM | POA: Diagnosis not present

## 2020-02-10 DIAGNOSIS — M80011D Age-related osteoporosis with current pathological fracture, right shoulder, subsequent encounter for fracture with routine healing: Secondary | ICD-10-CM | POA: Diagnosis not present

## 2020-02-10 DIAGNOSIS — I509 Heart failure, unspecified: Secondary | ICD-10-CM | POA: Diagnosis not present

## 2020-02-10 DIAGNOSIS — M109 Gout, unspecified: Secondary | ICD-10-CM | POA: Diagnosis not present

## 2020-02-10 DIAGNOSIS — N189 Chronic kidney disease, unspecified: Secondary | ICD-10-CM | POA: Diagnosis not present

## 2020-02-13 DIAGNOSIS — S81822D Laceration with foreign body, left lower leg, subsequent encounter: Secondary | ICD-10-CM | POA: Diagnosis not present

## 2020-02-13 DIAGNOSIS — N189 Chronic kidney disease, unspecified: Secondary | ICD-10-CM | POA: Diagnosis not present

## 2020-02-13 DIAGNOSIS — I130 Hypertensive heart and chronic kidney disease with heart failure and stage 1 through stage 4 chronic kidney disease, or unspecified chronic kidney disease: Secondary | ICD-10-CM | POA: Diagnosis not present

## 2020-02-13 DIAGNOSIS — M80011D Age-related osteoporosis with current pathological fracture, right shoulder, subsequent encounter for fracture with routine healing: Secondary | ICD-10-CM | POA: Diagnosis not present

## 2020-02-13 DIAGNOSIS — I509 Heart failure, unspecified: Secondary | ICD-10-CM | POA: Diagnosis not present

## 2020-02-13 DIAGNOSIS — M109 Gout, unspecified: Secondary | ICD-10-CM | POA: Diagnosis not present

## 2020-02-17 DIAGNOSIS — M109 Gout, unspecified: Secondary | ICD-10-CM | POA: Diagnosis not present

## 2020-02-17 DIAGNOSIS — M80011D Age-related osteoporosis with current pathological fracture, right shoulder, subsequent encounter for fracture with routine healing: Secondary | ICD-10-CM | POA: Diagnosis not present

## 2020-02-17 DIAGNOSIS — S81822D Laceration with foreign body, left lower leg, subsequent encounter: Secondary | ICD-10-CM | POA: Diagnosis not present

## 2020-02-17 DIAGNOSIS — N189 Chronic kidney disease, unspecified: Secondary | ICD-10-CM | POA: Diagnosis not present

## 2020-02-17 DIAGNOSIS — I509 Heart failure, unspecified: Secondary | ICD-10-CM | POA: Diagnosis not present

## 2020-02-17 DIAGNOSIS — I130 Hypertensive heart and chronic kidney disease with heart failure and stage 1 through stage 4 chronic kidney disease, or unspecified chronic kidney disease: Secondary | ICD-10-CM | POA: Diagnosis not present

## 2020-02-20 DIAGNOSIS — J45909 Unspecified asthma, uncomplicated: Secondary | ICD-10-CM | POA: Diagnosis not present

## 2020-02-20 DIAGNOSIS — R296 Repeated falls: Secondary | ICD-10-CM | POA: Diagnosis not present

## 2020-02-20 DIAGNOSIS — Z950 Presence of cardiac pacemaker: Secondary | ICD-10-CM | POA: Diagnosis not present

## 2020-02-20 DIAGNOSIS — M150 Primary generalized (osteo)arthritis: Secondary | ICD-10-CM | POA: Diagnosis not present

## 2020-02-20 DIAGNOSIS — M109 Gout, unspecified: Secondary | ICD-10-CM | POA: Diagnosis not present

## 2020-02-20 DIAGNOSIS — G9340 Encephalopathy, unspecified: Secondary | ICD-10-CM | POA: Diagnosis not present

## 2020-02-20 DIAGNOSIS — N189 Chronic kidney disease, unspecified: Secondary | ICD-10-CM | POA: Diagnosis not present

## 2020-02-20 DIAGNOSIS — S81822D Laceration with foreign body, left lower leg, subsequent encounter: Secondary | ICD-10-CM | POA: Diagnosis not present

## 2020-02-20 DIAGNOSIS — I130 Hypertensive heart and chronic kidney disease with heart failure and stage 1 through stage 4 chronic kidney disease, or unspecified chronic kidney disease: Secondary | ICD-10-CM | POA: Diagnosis not present

## 2020-02-20 DIAGNOSIS — E039 Hypothyroidism, unspecified: Secondary | ICD-10-CM | POA: Diagnosis not present

## 2020-02-20 DIAGNOSIS — M810 Age-related osteoporosis without current pathological fracture: Secondary | ICD-10-CM | POA: Diagnosis not present

## 2020-02-20 DIAGNOSIS — Z9181 History of falling: Secondary | ICD-10-CM | POA: Diagnosis not present

## 2020-02-20 DIAGNOSIS — I480 Paroxysmal atrial fibrillation: Secondary | ICD-10-CM | POA: Diagnosis not present

## 2020-02-20 DIAGNOSIS — I509 Heart failure, unspecified: Secondary | ICD-10-CM | POA: Diagnosis not present

## 2020-02-20 DIAGNOSIS — Z7901 Long term (current) use of anticoagulants: Secondary | ICD-10-CM | POA: Diagnosis not present

## 2020-02-20 DIAGNOSIS — Z96653 Presence of artificial knee joint, bilateral: Secondary | ICD-10-CM | POA: Diagnosis not present

## 2020-02-20 DIAGNOSIS — G629 Polyneuropathy, unspecified: Secondary | ICD-10-CM | POA: Diagnosis not present

## 2020-02-20 DIAGNOSIS — E46 Unspecified protein-calorie malnutrition: Secondary | ICD-10-CM | POA: Diagnosis not present

## 2020-02-20 DIAGNOSIS — M80011D Age-related osteoporosis with current pathological fracture, right shoulder, subsequent encounter for fracture with routine healing: Secondary | ICD-10-CM | POA: Diagnosis not present

## 2020-02-20 DIAGNOSIS — Z853 Personal history of malignant neoplasm of breast: Secondary | ICD-10-CM | POA: Diagnosis not present

## 2020-02-20 DIAGNOSIS — K5900 Constipation, unspecified: Secondary | ICD-10-CM | POA: Diagnosis not present

## 2020-02-20 DIAGNOSIS — Z89611 Acquired absence of right leg above knee: Secondary | ICD-10-CM | POA: Diagnosis not present

## 2020-02-24 DIAGNOSIS — S81822D Laceration with foreign body, left lower leg, subsequent encounter: Secondary | ICD-10-CM | POA: Diagnosis not present

## 2020-02-24 DIAGNOSIS — N189 Chronic kidney disease, unspecified: Secondary | ICD-10-CM | POA: Diagnosis not present

## 2020-02-24 DIAGNOSIS — M80011D Age-related osteoporosis with current pathological fracture, right shoulder, subsequent encounter for fracture with routine healing: Secondary | ICD-10-CM | POA: Diagnosis not present

## 2020-02-24 DIAGNOSIS — I509 Heart failure, unspecified: Secondary | ICD-10-CM | POA: Diagnosis not present

## 2020-02-24 DIAGNOSIS — M109 Gout, unspecified: Secondary | ICD-10-CM | POA: Diagnosis not present

## 2020-02-24 DIAGNOSIS — I130 Hypertensive heart and chronic kidney disease with heart failure and stage 1 through stage 4 chronic kidney disease, or unspecified chronic kidney disease: Secondary | ICD-10-CM | POA: Diagnosis not present

## 2020-02-26 DIAGNOSIS — I130 Hypertensive heart and chronic kidney disease with heart failure and stage 1 through stage 4 chronic kidney disease, or unspecified chronic kidney disease: Secondary | ICD-10-CM | POA: Diagnosis not present

## 2020-02-26 DIAGNOSIS — N189 Chronic kidney disease, unspecified: Secondary | ICD-10-CM | POA: Diagnosis not present

## 2020-02-26 DIAGNOSIS — S81822D Laceration with foreign body, left lower leg, subsequent encounter: Secondary | ICD-10-CM | POA: Diagnosis not present

## 2020-02-27 DIAGNOSIS — S81822D Laceration with foreign body, left lower leg, subsequent encounter: Secondary | ICD-10-CM | POA: Diagnosis not present

## 2020-02-27 DIAGNOSIS — N189 Chronic kidney disease, unspecified: Secondary | ICD-10-CM | POA: Diagnosis not present

## 2020-02-27 DIAGNOSIS — I130 Hypertensive heart and chronic kidney disease with heart failure and stage 1 through stage 4 chronic kidney disease, or unspecified chronic kidney disease: Secondary | ICD-10-CM | POA: Diagnosis not present

## 2020-02-27 DIAGNOSIS — M80011D Age-related osteoporosis with current pathological fracture, right shoulder, subsequent encounter for fracture with routine healing: Secondary | ICD-10-CM | POA: Diagnosis not present

## 2020-02-27 DIAGNOSIS — M109 Gout, unspecified: Secondary | ICD-10-CM | POA: Diagnosis not present

## 2020-02-27 DIAGNOSIS — I509 Heart failure, unspecified: Secondary | ICD-10-CM | POA: Diagnosis not present

## 2020-03-02 DIAGNOSIS — S81822D Laceration with foreign body, left lower leg, subsequent encounter: Secondary | ICD-10-CM | POA: Diagnosis not present

## 2020-03-02 DIAGNOSIS — M80011D Age-related osteoporosis with current pathological fracture, right shoulder, subsequent encounter for fracture with routine healing: Secondary | ICD-10-CM | POA: Diagnosis not present

## 2020-03-02 DIAGNOSIS — N189 Chronic kidney disease, unspecified: Secondary | ICD-10-CM | POA: Diagnosis not present

## 2020-03-02 DIAGNOSIS — I509 Heart failure, unspecified: Secondary | ICD-10-CM | POA: Diagnosis not present

## 2020-03-02 DIAGNOSIS — M109 Gout, unspecified: Secondary | ICD-10-CM | POA: Diagnosis not present

## 2020-03-02 DIAGNOSIS — I130 Hypertensive heart and chronic kidney disease with heart failure and stage 1 through stage 4 chronic kidney disease, or unspecified chronic kidney disease: Secondary | ICD-10-CM | POA: Diagnosis not present

## 2020-03-05 DIAGNOSIS — M109 Gout, unspecified: Secondary | ICD-10-CM | POA: Diagnosis not present

## 2020-03-05 DIAGNOSIS — I509 Heart failure, unspecified: Secondary | ICD-10-CM | POA: Diagnosis not present

## 2020-03-05 DIAGNOSIS — N189 Chronic kidney disease, unspecified: Secondary | ICD-10-CM | POA: Diagnosis not present

## 2020-03-05 DIAGNOSIS — I130 Hypertensive heart and chronic kidney disease with heart failure and stage 1 through stage 4 chronic kidney disease, or unspecified chronic kidney disease: Secondary | ICD-10-CM | POA: Diagnosis not present

## 2020-03-05 DIAGNOSIS — M80011D Age-related osteoporosis with current pathological fracture, right shoulder, subsequent encounter for fracture with routine healing: Secondary | ICD-10-CM | POA: Diagnosis not present

## 2020-03-05 DIAGNOSIS — S81822D Laceration with foreign body, left lower leg, subsequent encounter: Secondary | ICD-10-CM | POA: Diagnosis not present

## 2020-03-09 DIAGNOSIS — M109 Gout, unspecified: Secondary | ICD-10-CM | POA: Diagnosis not present

## 2020-03-09 DIAGNOSIS — S81822D Laceration with foreign body, left lower leg, subsequent encounter: Secondary | ICD-10-CM | POA: Diagnosis not present

## 2020-03-09 DIAGNOSIS — N189 Chronic kidney disease, unspecified: Secondary | ICD-10-CM | POA: Diagnosis not present

## 2020-03-09 DIAGNOSIS — I509 Heart failure, unspecified: Secondary | ICD-10-CM | POA: Diagnosis not present

## 2020-03-09 DIAGNOSIS — I130 Hypertensive heart and chronic kidney disease with heart failure and stage 1 through stage 4 chronic kidney disease, or unspecified chronic kidney disease: Secondary | ICD-10-CM | POA: Diagnosis not present

## 2020-03-09 DIAGNOSIS — M80011D Age-related osteoporosis with current pathological fracture, right shoulder, subsequent encounter for fracture with routine healing: Secondary | ICD-10-CM | POA: Diagnosis not present

## 2020-03-12 DIAGNOSIS — I509 Heart failure, unspecified: Secondary | ICD-10-CM | POA: Diagnosis not present

## 2020-03-12 DIAGNOSIS — M109 Gout, unspecified: Secondary | ICD-10-CM | POA: Diagnosis not present

## 2020-03-12 DIAGNOSIS — N189 Chronic kidney disease, unspecified: Secondary | ICD-10-CM | POA: Diagnosis not present

## 2020-03-12 DIAGNOSIS — I130 Hypertensive heart and chronic kidney disease with heart failure and stage 1 through stage 4 chronic kidney disease, or unspecified chronic kidney disease: Secondary | ICD-10-CM | POA: Diagnosis not present

## 2020-03-12 DIAGNOSIS — M80011D Age-related osteoporosis with current pathological fracture, right shoulder, subsequent encounter for fracture with routine healing: Secondary | ICD-10-CM | POA: Diagnosis not present

## 2020-03-12 DIAGNOSIS — S81822D Laceration with foreign body, left lower leg, subsequent encounter: Secondary | ICD-10-CM | POA: Diagnosis not present

## 2020-03-16 DIAGNOSIS — I509 Heart failure, unspecified: Secondary | ICD-10-CM | POA: Diagnosis not present

## 2020-03-16 DIAGNOSIS — N189 Chronic kidney disease, unspecified: Secondary | ICD-10-CM | POA: Diagnosis not present

## 2020-03-16 DIAGNOSIS — I130 Hypertensive heart and chronic kidney disease with heart failure and stage 1 through stage 4 chronic kidney disease, or unspecified chronic kidney disease: Secondary | ICD-10-CM | POA: Diagnosis not present

## 2020-03-16 DIAGNOSIS — M109 Gout, unspecified: Secondary | ICD-10-CM | POA: Diagnosis not present

## 2020-03-16 DIAGNOSIS — M80011D Age-related osteoporosis with current pathological fracture, right shoulder, subsequent encounter for fracture with routine healing: Secondary | ICD-10-CM | POA: Diagnosis not present

## 2020-03-16 DIAGNOSIS — S81822D Laceration with foreign body, left lower leg, subsequent encounter: Secondary | ICD-10-CM | POA: Diagnosis not present

## 2020-03-19 DIAGNOSIS — I130 Hypertensive heart and chronic kidney disease with heart failure and stage 1 through stage 4 chronic kidney disease, or unspecified chronic kidney disease: Secondary | ICD-10-CM | POA: Diagnosis not present

## 2020-03-19 DIAGNOSIS — I509 Heart failure, unspecified: Secondary | ICD-10-CM | POA: Diagnosis not present

## 2020-03-19 DIAGNOSIS — N189 Chronic kidney disease, unspecified: Secondary | ICD-10-CM | POA: Diagnosis not present

## 2020-03-19 DIAGNOSIS — S81822D Laceration with foreign body, left lower leg, subsequent encounter: Secondary | ICD-10-CM | POA: Diagnosis not present

## 2020-03-19 DIAGNOSIS — M109 Gout, unspecified: Secondary | ICD-10-CM | POA: Diagnosis not present

## 2020-03-19 DIAGNOSIS — M80011D Age-related osteoporosis with current pathological fracture, right shoulder, subsequent encounter for fracture with routine healing: Secondary | ICD-10-CM | POA: Diagnosis not present

## 2020-03-21 DIAGNOSIS — J45909 Unspecified asthma, uncomplicated: Secondary | ICD-10-CM | POA: Diagnosis not present

## 2020-03-21 DIAGNOSIS — Z853 Personal history of malignant neoplasm of breast: Secondary | ICD-10-CM | POA: Diagnosis not present

## 2020-03-21 DIAGNOSIS — E46 Unspecified protein-calorie malnutrition: Secondary | ICD-10-CM | POA: Diagnosis not present

## 2020-03-21 DIAGNOSIS — Z7901 Long term (current) use of anticoagulants: Secondary | ICD-10-CM | POA: Diagnosis not present

## 2020-03-21 DIAGNOSIS — Z96653 Presence of artificial knee joint, bilateral: Secondary | ICD-10-CM | POA: Diagnosis not present

## 2020-03-21 DIAGNOSIS — M80011D Age-related osteoporosis with current pathological fracture, right shoulder, subsequent encounter for fracture with routine healing: Secondary | ICD-10-CM | POA: Diagnosis not present

## 2020-03-21 DIAGNOSIS — E039 Hypothyroidism, unspecified: Secondary | ICD-10-CM | POA: Diagnosis not present

## 2020-03-21 DIAGNOSIS — Z9181 History of falling: Secondary | ICD-10-CM | POA: Diagnosis not present

## 2020-03-21 DIAGNOSIS — M810 Age-related osteoporosis without current pathological fracture: Secondary | ICD-10-CM | POA: Diagnosis not present

## 2020-03-21 DIAGNOSIS — I130 Hypertensive heart and chronic kidney disease with heart failure and stage 1 through stage 4 chronic kidney disease, or unspecified chronic kidney disease: Secondary | ICD-10-CM | POA: Diagnosis not present

## 2020-03-21 DIAGNOSIS — K5900 Constipation, unspecified: Secondary | ICD-10-CM | POA: Diagnosis not present

## 2020-03-21 DIAGNOSIS — M109 Gout, unspecified: Secondary | ICD-10-CM | POA: Diagnosis not present

## 2020-03-21 DIAGNOSIS — M150 Primary generalized (osteo)arthritis: Secondary | ICD-10-CM | POA: Diagnosis not present

## 2020-03-21 DIAGNOSIS — N189 Chronic kidney disease, unspecified: Secondary | ICD-10-CM | POA: Diagnosis not present

## 2020-03-21 DIAGNOSIS — Z89611 Acquired absence of right leg above knee: Secondary | ICD-10-CM | POA: Diagnosis not present

## 2020-03-21 DIAGNOSIS — R296 Repeated falls: Secondary | ICD-10-CM | POA: Diagnosis not present

## 2020-03-21 DIAGNOSIS — S81822D Laceration with foreign body, left lower leg, subsequent encounter: Secondary | ICD-10-CM | POA: Diagnosis not present

## 2020-03-21 DIAGNOSIS — G9340 Encephalopathy, unspecified: Secondary | ICD-10-CM | POA: Diagnosis not present

## 2020-03-21 DIAGNOSIS — Z950 Presence of cardiac pacemaker: Secondary | ICD-10-CM | POA: Diagnosis not present

## 2020-03-21 DIAGNOSIS — G629 Polyneuropathy, unspecified: Secondary | ICD-10-CM | POA: Diagnosis not present

## 2020-03-21 DIAGNOSIS — I509 Heart failure, unspecified: Secondary | ICD-10-CM | POA: Diagnosis not present

## 2020-03-21 DIAGNOSIS — I480 Paroxysmal atrial fibrillation: Secondary | ICD-10-CM | POA: Diagnosis not present

## 2020-03-23 DIAGNOSIS — M109 Gout, unspecified: Secondary | ICD-10-CM | POA: Diagnosis not present

## 2020-03-23 DIAGNOSIS — N189 Chronic kidney disease, unspecified: Secondary | ICD-10-CM | POA: Diagnosis not present

## 2020-03-23 DIAGNOSIS — M80011D Age-related osteoporosis with current pathological fracture, right shoulder, subsequent encounter for fracture with routine healing: Secondary | ICD-10-CM | POA: Diagnosis not present

## 2020-03-23 DIAGNOSIS — I130 Hypertensive heart and chronic kidney disease with heart failure and stage 1 through stage 4 chronic kidney disease, or unspecified chronic kidney disease: Secondary | ICD-10-CM | POA: Diagnosis not present

## 2020-03-23 DIAGNOSIS — I509 Heart failure, unspecified: Secondary | ICD-10-CM | POA: Diagnosis not present

## 2020-03-23 DIAGNOSIS — S81822D Laceration with foreign body, left lower leg, subsequent encounter: Secondary | ICD-10-CM | POA: Diagnosis not present

## 2020-03-26 DIAGNOSIS — S81822D Laceration with foreign body, left lower leg, subsequent encounter: Secondary | ICD-10-CM | POA: Diagnosis not present

## 2020-03-26 DIAGNOSIS — M109 Gout, unspecified: Secondary | ICD-10-CM | POA: Diagnosis not present

## 2020-03-26 DIAGNOSIS — N189 Chronic kidney disease, unspecified: Secondary | ICD-10-CM | POA: Diagnosis not present

## 2020-03-26 DIAGNOSIS — I509 Heart failure, unspecified: Secondary | ICD-10-CM | POA: Diagnosis not present

## 2020-03-26 DIAGNOSIS — M80011D Age-related osteoporosis with current pathological fracture, right shoulder, subsequent encounter for fracture with routine healing: Secondary | ICD-10-CM | POA: Diagnosis not present

## 2020-03-26 DIAGNOSIS — I130 Hypertensive heart and chronic kidney disease with heart failure and stage 1 through stage 4 chronic kidney disease, or unspecified chronic kidney disease: Secondary | ICD-10-CM | POA: Diagnosis not present

## 2020-03-30 DIAGNOSIS — I130 Hypertensive heart and chronic kidney disease with heart failure and stage 1 through stage 4 chronic kidney disease, or unspecified chronic kidney disease: Secondary | ICD-10-CM | POA: Diagnosis not present

## 2020-03-30 DIAGNOSIS — M109 Gout, unspecified: Secondary | ICD-10-CM | POA: Diagnosis not present

## 2020-03-30 DIAGNOSIS — S81822D Laceration with foreign body, left lower leg, subsequent encounter: Secondary | ICD-10-CM | POA: Diagnosis not present

## 2020-03-30 DIAGNOSIS — I10 Essential (primary) hypertension: Secondary | ICD-10-CM | POA: Diagnosis not present

## 2020-03-30 DIAGNOSIS — E039 Hypothyroidism, unspecified: Secondary | ICD-10-CM | POA: Diagnosis not present

## 2020-03-30 DIAGNOSIS — N189 Chronic kidney disease, unspecified: Secondary | ICD-10-CM | POA: Diagnosis not present

## 2020-03-30 DIAGNOSIS — I509 Heart failure, unspecified: Secondary | ICD-10-CM | POA: Diagnosis not present

## 2020-03-30 DIAGNOSIS — M80011D Age-related osteoporosis with current pathological fracture, right shoulder, subsequent encounter for fracture with routine healing: Secondary | ICD-10-CM | POA: Diagnosis not present

## 2020-04-02 DIAGNOSIS — I509 Heart failure, unspecified: Secondary | ICD-10-CM | POA: Diagnosis not present

## 2020-04-02 DIAGNOSIS — I130 Hypertensive heart and chronic kidney disease with heart failure and stage 1 through stage 4 chronic kidney disease, or unspecified chronic kidney disease: Secondary | ICD-10-CM | POA: Diagnosis not present

## 2020-04-02 DIAGNOSIS — S81822D Laceration with foreign body, left lower leg, subsequent encounter: Secondary | ICD-10-CM | POA: Diagnosis not present

## 2020-04-02 DIAGNOSIS — M80011D Age-related osteoporosis with current pathological fracture, right shoulder, subsequent encounter for fracture with routine healing: Secondary | ICD-10-CM | POA: Diagnosis not present

## 2020-04-02 DIAGNOSIS — M109 Gout, unspecified: Secondary | ICD-10-CM | POA: Diagnosis not present

## 2020-04-02 DIAGNOSIS — N189 Chronic kidney disease, unspecified: Secondary | ICD-10-CM | POA: Diagnosis not present

## 2020-04-06 DIAGNOSIS — I272 Pulmonary hypertension, unspecified: Secondary | ICD-10-CM | POA: Diagnosis not present

## 2020-04-06 DIAGNOSIS — M79621 Pain in right upper arm: Secondary | ICD-10-CM | POA: Diagnosis not present

## 2020-04-06 DIAGNOSIS — N189 Chronic kidney disease, unspecified: Secondary | ICD-10-CM | POA: Diagnosis not present

## 2020-04-06 DIAGNOSIS — M80011D Age-related osteoporosis with current pathological fracture, right shoulder, subsequent encounter for fracture with routine healing: Secondary | ICD-10-CM | POA: Diagnosis not present

## 2020-04-06 DIAGNOSIS — D649 Anemia, unspecified: Secondary | ICD-10-CM | POA: Diagnosis not present

## 2020-04-06 DIAGNOSIS — E039 Hypothyroidism, unspecified: Secondary | ICD-10-CM | POA: Diagnosis not present

## 2020-04-06 DIAGNOSIS — M79631 Pain in right forearm: Secondary | ICD-10-CM | POA: Diagnosis not present

## 2020-04-06 DIAGNOSIS — M109 Gout, unspecified: Secondary | ICD-10-CM | POA: Diagnosis not present

## 2020-04-06 DIAGNOSIS — I10 Essential (primary) hypertension: Secondary | ICD-10-CM | POA: Diagnosis not present

## 2020-04-06 DIAGNOSIS — I509 Heart failure, unspecified: Secondary | ICD-10-CM | POA: Diagnosis not present

## 2020-04-06 DIAGNOSIS — M25511 Pain in right shoulder: Secondary | ICD-10-CM | POA: Diagnosis not present

## 2020-04-06 DIAGNOSIS — Z89619 Acquired absence of unspecified leg above knee: Secondary | ICD-10-CM | POA: Diagnosis not present

## 2020-04-06 DIAGNOSIS — I340 Nonrheumatic mitral (valve) insufficiency: Secondary | ICD-10-CM | POA: Diagnosis not present

## 2020-04-06 DIAGNOSIS — R0609 Other forms of dyspnea: Secondary | ICD-10-CM | POA: Diagnosis not present

## 2020-04-06 DIAGNOSIS — J449 Chronic obstructive pulmonary disease, unspecified: Secondary | ICD-10-CM | POA: Diagnosis not present

## 2020-04-06 DIAGNOSIS — I4891 Unspecified atrial fibrillation: Secondary | ICD-10-CM | POA: Diagnosis not present

## 2020-04-06 DIAGNOSIS — I130 Hypertensive heart and chronic kidney disease with heart failure and stage 1 through stage 4 chronic kidney disease, or unspecified chronic kidney disease: Secondary | ICD-10-CM | POA: Diagnosis not present

## 2020-04-06 DIAGNOSIS — S81822D Laceration with foreign body, left lower leg, subsequent encounter: Secondary | ICD-10-CM | POA: Diagnosis not present

## 2020-04-06 DIAGNOSIS — M79601 Pain in right arm: Secondary | ICD-10-CM | POA: Diagnosis not present

## 2020-04-09 DIAGNOSIS — S81822D Laceration with foreign body, left lower leg, subsequent encounter: Secondary | ICD-10-CM | POA: Diagnosis not present

## 2020-04-09 DIAGNOSIS — M109 Gout, unspecified: Secondary | ICD-10-CM | POA: Diagnosis not present

## 2020-04-09 DIAGNOSIS — I509 Heart failure, unspecified: Secondary | ICD-10-CM | POA: Diagnosis not present

## 2020-04-09 DIAGNOSIS — I130 Hypertensive heart and chronic kidney disease with heart failure and stage 1 through stage 4 chronic kidney disease, or unspecified chronic kidney disease: Secondary | ICD-10-CM | POA: Diagnosis not present

## 2020-04-09 DIAGNOSIS — N189 Chronic kidney disease, unspecified: Secondary | ICD-10-CM | POA: Diagnosis not present

## 2020-04-09 DIAGNOSIS — M80011D Age-related osteoporosis with current pathological fracture, right shoulder, subsequent encounter for fracture with routine healing: Secondary | ICD-10-CM | POA: Diagnosis not present

## 2020-04-13 DIAGNOSIS — N189 Chronic kidney disease, unspecified: Secondary | ICD-10-CM | POA: Diagnosis not present

## 2020-04-13 DIAGNOSIS — M80011D Age-related osteoporosis with current pathological fracture, right shoulder, subsequent encounter for fracture with routine healing: Secondary | ICD-10-CM | POA: Diagnosis not present

## 2020-04-13 DIAGNOSIS — I509 Heart failure, unspecified: Secondary | ICD-10-CM | POA: Diagnosis not present

## 2020-04-13 DIAGNOSIS — M109 Gout, unspecified: Secondary | ICD-10-CM | POA: Diagnosis not present

## 2020-04-13 DIAGNOSIS — I130 Hypertensive heart and chronic kidney disease with heart failure and stage 1 through stage 4 chronic kidney disease, or unspecified chronic kidney disease: Secondary | ICD-10-CM | POA: Diagnosis not present

## 2020-04-13 DIAGNOSIS — S81822D Laceration with foreign body, left lower leg, subsequent encounter: Secondary | ICD-10-CM | POA: Diagnosis not present

## 2020-04-16 DIAGNOSIS — N189 Chronic kidney disease, unspecified: Secondary | ICD-10-CM | POA: Diagnosis not present

## 2020-04-16 DIAGNOSIS — I509 Heart failure, unspecified: Secondary | ICD-10-CM | POA: Diagnosis not present

## 2020-04-16 DIAGNOSIS — M109 Gout, unspecified: Secondary | ICD-10-CM | POA: Diagnosis not present

## 2020-04-16 DIAGNOSIS — M80011D Age-related osteoporosis with current pathological fracture, right shoulder, subsequent encounter for fracture with routine healing: Secondary | ICD-10-CM | POA: Diagnosis not present

## 2020-04-16 DIAGNOSIS — I130 Hypertensive heart and chronic kidney disease with heart failure and stage 1 through stage 4 chronic kidney disease, or unspecified chronic kidney disease: Secondary | ICD-10-CM | POA: Diagnosis not present

## 2020-04-16 DIAGNOSIS — S81822D Laceration with foreign body, left lower leg, subsequent encounter: Secondary | ICD-10-CM | POA: Diagnosis not present

## 2020-04-21 NOTE — Progress Notes (Signed)
Primary Physician/Referring:  Jani Gravel, MD  Patient ID: [name], female    DOB: 10-21-1931, 86 y.o.   MRN: [id number]  Chief Complaint  Patient presents with  . Congestive Heart Failure  . Follow-up    6 month   HPI:    [name]  is a 86 y.o. Caucasian female  with complete heart block S/P permanent pacemaker implantation on 11/27/2013, permanent atrial fibrillation on chronic anticoagulation with Eliquis,  hypertrophic cardiomyopathy status post septal ablation at [facility] sometime in [id number], Chronic systolic and diastolic heart failure due to burnt out HOCM, EF improved to  normal on medical therapy by echo in 01/2019, moderate pulmonary hypertension, severe COPD due to bronchial asthma, stage III chronic kidney disease and anemia of chronic disease.   Patient is accompanied by with her daughter-in-law [name] at today's office visit.  She provides verbal consent in regards to discussing her plan of care in her presence.  Since last office visit patient states that she is doing well from a cardiovascular standpoint.  She has not had any hospitalizations or urgent care visits.  Patient is tolerating her cardiac medications well without any side effects or intolerances.  Patient is euvolemic and not in congestive heart failure. [name] Past Medical History:  Diagnosis Date  . Acute on chronic diastolic CHF (congestive heart failure) ([place]) 05/18/2015  . Acute respiratory failure ([place]) 06/09/2016  . Anemia   . Arthritis   . Arthritis pain   . Asthma   . Blood transfusion   . Bronchitis   . Cancer ([place])    lt breast  . CHF (congestive heart failure) ([place])   . Constipation   . Current use of long term anticoagulation   . Cystitis   . Encounter for care of pacemaker 06/13/2019  . Heart block    Complete heart block post surgical requiring pacer  . Humerus shaft fracture    nonsurgical, June 2017  . Hypertr obst cardiomyop    Required septal  myotomy  . Hypokalemia    Postoperative, improved  . Hyponatremia    Mild postoperative, improved  . Memory difficulty   . Mitral regurgitation   . Pacemaker   . Urinary incontinence    Past Surgical History:  Procedure Laterality Date  . ABDOMINAL HYSTERECTOMY    . ABOVE KNEE LEG AMPUTATION Right   . Anterior cervical diskectomy and fusion     C-7  . BELOW KNEE LEG AMPUTATION Right   . BREAST BIOPSY     Left, negative  . BREAST LUMPECTOMY  2012   left breast  . cardiomyopathy    . COLONOSCOPY    . FEMUR FRACTURE SURGERY     Right  . FRACTURE SURGERY    . JOINT REPLACEMENT    . ORIF ELBOW FRACTURE Right 04/08/2018   Procedure: OPEN REDUCTION INTERNAL FIXATION (ORIF) ELBOW/OLECRANON FRACTURE;  Surgeon: Renette Butters, MD;  Location: [facility];  Service: Orthopedics;  Laterality: Right;  . PACEMAKER PLACEMENT  2006/11-27-2013   MDT dual chamber pacemaker implanted 2006 with gen change by Dr Lovena Le 11-27-2013  . PERMANENT PACEMAKER GENERATOR CHANGE N/A 11/27/2013   Procedure: PERMANENT PACEMAKER GENERATOR CHANGE;  Surgeon: Evans Lance, MD;  Location: [facility] CATH LAB;  Service: Cardiovascular;  Laterality: N/A;  . REPLACEMENT TOTAL KNEE BILATERAL    . Septal myotomy    . SHOULDER SURGERY     Right  . WRIST FRACTURE SURGERY     Right  Family History  Problem Relation Age of Onset  . Heart disease Mother   . Hypertension Mother   . Pancreatic cancer Mother   . Cancer Mother        pancreatic  . Pancreatic cancer Father   . Cancer Father        pancreatic  . Diabetes Brother   . Pancreatic cancer Other   . Heart disease Brother        Enlarged heart    Social History   Tobacco Use  . Smoking status: Never Smoker  . Smokeless tobacco: Never Used  Substance Use Topics  . Alcohol use: Not Currently   Marital Status: Married  ROS  Review of Systems  Constitution: Negative for chills, decreased appetite, malaise/fatigue and weight gain.  Cardiovascular: Positive  for dyspnea on exertion (stable). Negative for chest pain, claudication, leg swelling (right BKA) and syncope.  Endocrine: Negative for cold intolerance.  Hematologic/Lymphatic: Does not bruise/bleed easily.  Musculoskeletal: Positive for arthritis and back pain. Negative for joint swelling.  Gastrointestinal: Negative for abdominal pain, anorexia, change in bowel habit, hematochezia and melena.  Neurological: Negative for headaches and light-headedness.  Psychiatric/Behavioral: Negative for depression and substance abuse.   Objective  Blood pressure 95/60, pulse 67, temperature 97.8 F (36.6 C), temperature source Temporal, resp. rate 18, height 5\' 3"  (1.6 m), weight 115 lb (52.2 kg), SpO2 94 %.  Vitals with BMI 04/22/2020 10/24/2019 10/08/2019  Height 5\' 3"  5\' 3"  -  Weight 115 lbs 106 lbs 5 oz -  BMI [id number] -  Systolic 95 89 [id number]  Diastolic 60 47 76  Pulse 67 68 72     Physical Exam  Constitutional: She appears well-developed and well-nourished. No distress.  HENT:  Head: Atraumatic.  Eyes: Conjunctivae are normal.  Neck: No JVD present. No thyromegaly present.  Cardiovascular: Normal rate, regular rhythm and intact distal pulses. Exam reveals no gallop.  Murmur heard. High-pitched blowing holosystolic murmur is present with a grade of 2/6 at the apex radiating to the apex. Pulses:      Carotid pulses are 2+ on the right side and 2+ on the left side.      Radial pulses are 2+ on the right side and 2+ on the left side.       Femoral pulses are 2+ on the right side and 2+ on the left side.      Popliteal pulses are 0 on the left side. Right popliteal pulse not accessible.       Dorsalis pedis pulses are 1+ on the left side. Right dorsalis pedis pulse not accessible.       Posterior tibial pulses are 0 on the left side. Right posterior tibial pulse not accessible.  No leg edema. No JVD.    Pulmonary/Chest: Effort normal and breath sounds normal. No accessory muscle usage. No  respiratory distress. She has no wheezes. She has no rales.  Pacemaker present on the right infraclavicular region.    Abdominal: Soft. Bowel sounds are normal.  Musculoskeletal:        General: No edema. Normal range of motion.     Cervical back: Neck supple.  Neurological: She is alert.  Skin: Skin is warm and dry.  Psychiatric: She has a normal mood and affect.  Nursing note and vitals reviewed.  Laboratory examination:   Recent Labs    10/03/19 0413 10/05/19 1123 10/06/19 0800  NA 141 138 143  K 3.5 4.3 4.0  CL 101 104 109  CO2 24 25 24   GLUCOSE 81 101* 86  BUN 11 19 20   CREATININE 0.82 1.01* 0.97  CALCIUM 9.1 8.9 9.1  GFRNONAA >60 50* 53*  GFRAA >60 58* >60   CrCl cannot be calculated (Patient's most recent lab result is older than the maximum 21 days allowed.).  CMP Latest Ref Rng & Units 10/06/2019 10/05/2019 10/03/2019  Glucose 70 - 99 mg/dL 86 101(H) 81  BUN 8 - 23 mg/dL 20 19 11   Creatinine 0.44 - 1.00 mg/dL 0.97 1.01(H) 0.82  Sodium 135 - 145 mmol/L 143 138 141  Potassium 3.5 - 5.1 mmol/L 4.0 4.3 3.5  Chloride 98 - 111 mmol/L 109 104 101  CO2 22 - 32 mmol/L 24 25 24   Calcium 8.9 - 10.3 mg/dL 9.1 8.9 9.1  Total Protein 6.5 - 8.1 g/dL - - -  Total Bilirubin 0.3 - 1.2 mg/dL - - -  Alkaline Phos 38 - 126 U/L - - -  AST 15 - 41 U/L - - -  ALT 0 - 44 U/L - - -   CBC Latest Ref Rng & Units 10/06/2019 10/03/2019 10/02/2019  WBC 4.0 - 10.5 K/uL 7.0 8.6 11.9(H)  Hemoglobin 12.0 - 15.0 g/dL 14.3 14.0 14.9  Hematocrit 36.0 - 46.0 % 41.5 43.2 43.9  Platelets 150 - 400 K/uL 203 204 223   Lipid Panel  No results found for: CHOL, TRIG, HDL, CHOLHDL, VLDL, LDLCALC, LDLDIRECT HEMOGLOBIN A1C No results found for: HGBA1C, MPG TSH Recent Labs    10/03/19 0413  TSH 5.976*   Medications and allergies   Allergies  Allergen Reactions  . Codeine Nausea And Vomiting  . Morphine And Related Nausea And Vomiting  . Sulfa Antibiotics Nausea And Vomiting  . Ceftaroline  Rash     Current Outpatient Medications  Medication Instructions  . acetaminophen (TYLENOL) 1,000 mg, Oral, 2 times daily  . amiodarone (PACERONE) 200 MG tablet TAKE 1 TABLET DAILY  . apixaban (ELIQUIS) 2.5 mg, Oral, 2 times daily  . furosemide (LASIX) 20 mg, Oral, Daily  . gabapentin (NEURONTIN) 200 mg, Oral, 2 times daily  . hydrALAZINE (APRESOLINE) 25 MG tablet TAKE 1 TABLET THREE TIMES A DAY  . ibuprofen (ADVIL) 400-600 mg, Oral, Every 6 hours PRN  . levothyroxine (SYNTHROID) 50 mcg, Oral, Daily before breakfast  . lisinopril (ZESTRIL) 10 mg, Oral, Daily  . metoprolol succinate (TOPROL-XL) 100 mg, Oral, Daily   Radiology:   No results found.  Cardiac Studies:   Remote pacemaker check 12/23/2019: There were 0 high ventricular rate episodes detected. Health trends do not demonstrate significant abnormality. Battery longevity is 7 years. RV pacing is 99.9 %.  Echocardiogram 10/04/2019:  Normal LVEF, 65-70%.  Severe LVH.  Diastolic function not evaluated. Normal right ventricle systolic function and size.  Severe mitral and the calcification, moderate to severe mitral valve regurgitation.  Mild-to-moderate tricuspid regurgitation, moderate pulmonary hypertension, PA systolic pressure 52 mmHg.  EKG 04/22/2020: Ventricular paced rhythm.   Assessment     ICD-10-CM   1. Encounter for care of pacemaker  Z45.018   2. Pacemaker Medtronic Adapta dual-chamber pacemaker 2006, generator change 11/27/2013, programmed to VVI   Z95.0   3. Heart block AV complete (HCC)  I44.2   4. Permanent atrial fibrillation (HCC)  I48.21 EKG 12-Lead  5. Chronic diastolic congestive heart failure (HCC)  I50.32     Recommendations:   Patient who is pacemaker dependent with underlying complete heart block and permanent atrial fibrillation presently on long-term and correlation  with Eliquis, chronic diastolic heart failure, hypertrophic cardiomyopathy S/P myomectomy in the remote past in [place] clinic  who presents here for follow-up of pacemaker function and also chronic diastolic heart failure.  Patient is currently euvolemic and not in congestive heart failure.  Medications reconciled.  Continue guideline directed medical therapy.  Given her history of atrial fibrillation patient is currently on AV nodal blocking agents and amiodarone.  Continue thromboembolic prophylaxis with Eliquis.  Patient does not endorse any evidence of bleeding.  Pacemaker in situ: Most recent pacemaker interrogation reviewed with both the patient and her daughter-in-law at today's office visit.  Patient is pacemaker dependent.  Recommend regular pacemaker follow-up.  I'll see her back in 6 months for follow-up of hypertension, chronic diastolic CHF and cardiomyopathy.   Rex Kras, [place], [facility]  Pager: [phone] Office: [phone]

## 2020-04-22 ENCOUNTER — Ambulatory Visit: Payer: Medicare Other | Admitting: Cardiology

## 2020-04-22 ENCOUNTER — Other Ambulatory Visit: Payer: Self-pay

## 2020-04-22 ENCOUNTER — Encounter: Payer: Self-pay | Admitting: Cardiology

## 2020-04-22 VITALS — BP 95/60 | HR 67 | Temp 97.8°F | Resp 18 | Ht 63.0 in | Wt 115.0 lb

## 2020-04-22 DIAGNOSIS — I5032 Chronic diastolic (congestive) heart failure: Secondary | ICD-10-CM

## 2020-04-22 DIAGNOSIS — I4821 Permanent atrial fibrillation: Secondary | ICD-10-CM | POA: Diagnosis not present

## 2020-04-22 DIAGNOSIS — Z950 Presence of cardiac pacemaker: Secondary | ICD-10-CM

## 2020-04-22 DIAGNOSIS — I442 Atrioventricular block, complete: Secondary | ICD-10-CM | POA: Diagnosis not present

## 2020-04-22 DIAGNOSIS — Z45018 Encounter for adjustment and management of other part of cardiac pacemaker: Secondary | ICD-10-CM | POA: Diagnosis not present

## 2020-05-02 ENCOUNTER — Ambulatory Visit
Admission: EM | Admit: 2020-05-02 | Discharge: 2020-05-02 | Disposition: A | Discharge status: Home / Self Care | Payer: Medicare Other | Attending: Physician Assistant | Admitting: Physician Assistant

## 2020-05-02 ENCOUNTER — Other Ambulatory Visit: Payer: Self-pay

## 2020-05-02 DIAGNOSIS — S61411A Laceration without foreign body of right hand, initial encounter: Secondary | ICD-10-CM | POA: Diagnosis not present

## 2020-05-02 MED ORDER — LIDOCAINE-EPINEPHRINE-TETRACAINE (LET) TOPICAL GEL: 3.0000 mL | Freq: Once | TOPICAL | Status: DC

## 2020-05-02 NOTE — ED Triage Notes (Signed)
Patient is here for evaluation of a right hand laceration that she states occurred last night when a lamp fell on it.

## 2020-05-02 NOTE — ED Provider Notes (Signed)
[facility]    CSN: [id number] Arrival date & time: 05/02/20  0844      History   Chief Complaint Chief Complaint  Patient presents with  . Hand Injury    laceration    HPI [name] is a 86 y.o. female.   86 year old female comes in with family member for right hand laceration sustained <18 hours ago.  Lamp fell onto right hand while changing a light bulb.  Bleeding controlled with pressure.  Cleaned wound and dressed it last night.  Due to continued bleeding, came in for evaluation.  Denies swelling, erythema, warmth. LHD     Past Medical History:  Diagnosis Date  . Acute on chronic diastolic CHF (congestive heart failure) ([place]) 05/18/2015  . Acute respiratory failure ([place]) 06/09/2016  . Anemia   . Arthritis   . Arthritis pain   . Asthma   . Blood transfusion   . Bronchitis   . Cancer ([place])    lt breast  . CHF (congestive heart failure) ([place])   . Constipation   . Current use of long term anticoagulation   . Cystitis   . Encounter for care of pacemaker 06/13/2019  . Heart block    Complete heart block post surgical requiring pacer  . Humerus shaft fracture    nonsurgical, June 2017  . Hypertr obst cardiomyop    Required septal myotomy  . Hypokalemia    Postoperative, improved  . Hyponatremia    Mild postoperative, improved  . Memory difficulty   . Mitral regurgitation   . Pacemaker   . Urinary incontinence     Patient Active Problem List   Diagnosis Date Noted  . Encephalopathy 10/02/2019  . Elevated troponin 10/02/2019  . AMS (altered mental status) 10/02/2019  . Heart block   . Encounter for care of pacemaker 06/13/2019  . Right carotid bruit 02/18/2019  . CKD (chronic kidney disease) 01/29/2019  . Hypoglycemia 01/29/2019  . CHF (congestive heart failure) ([place]) 01/28/2019  . Rt Olecranon fracture 04/07/2018  . Essential hypertension 04/07/2018  . Rt Hip Fracture ([place]) 04/06/2018  . Obstructive lung disease (generalized) ([place])  10/04/2017  . Humerus fracture 06/09/2016  . Hx of right BKA ([place]) 06/09/2016  . Malnutrition of moderate degree 06/09/2016  . Unilateral AKA ([place]) 09/23/2014  . Chronic atrial fibrillation ([place]) 08/12/2014  . Hypertension 01/17/2014  . Cardiac pacemaker in situ 01/17/2014  . Heart block AV complete ([place]) 07/03/2013  . Atrial fibrillation ([place]) 07/03/2013  . Breast cancer, left breast ([place]) 04/24/2013  . Other symptoms involving cardiovascular system 12/22/2010  . MITRAL REGURGITATION 06/17/2010  . HOCM (hypertrophic obstructive cardiomyopathy) ([place]) 06/17/2010  . History of heart block 06/17/2010  . HEMORRHOIDS 06/17/2010  . BRONCHITIS 06/17/2010  . ASTHMA 06/17/2010  . CYSTITIS 06/17/2010  . URINARY INCONTINENCE 06/17/2010  . Pacemaker Medtronic Adapta dual-chamber pacemaker 2006, generator change 11/27/2013, programmed to VVI  06/21/2005    Past Surgical History:  Procedure Laterality Date  . ABDOMINAL HYSTERECTOMY    . ABOVE KNEE LEG AMPUTATION Right   . Anterior cervical diskectomy and fusion     C-7  . BELOW KNEE LEG AMPUTATION Right   . BREAST BIOPSY     Left, negative  . BREAST LUMPECTOMY  2012   left breast  . cardiomyopathy    . COLONOSCOPY    . FEMUR FRACTURE SURGERY     Right  . FRACTURE SURGERY    . JOINT REPLACEMENT    . ORIF ELBOW  FRACTURE Right 04/08/2018   Procedure: OPEN REDUCTION INTERNAL FIXATION (ORIF) ELBOW/OLECRANON FRACTURE;  Surgeon: Renette Butters, MD;  Location: [facility];  Service: Orthopedics;  Laterality: Right;  . PACEMAKER PLACEMENT  2006/11-27-2013   MDT dual chamber pacemaker implanted 2006 with gen change by Dr Lovena Le 11-27-2013  . PERMANENT PACEMAKER GENERATOR CHANGE N/A 11/27/2013   Procedure: PERMANENT PACEMAKER GENERATOR CHANGE;  Surgeon: Evans Lance, MD;  Location: [facility] CATH LAB;  Service: Cardiovascular;  Laterality: N/A;  . REPLACEMENT TOTAL KNEE BILATERAL    . Septal myotomy    . SHOULDER SURGERY     Right  . WRIST FRACTURE  SURGERY     Right    OB History   No obstetric history on file.      Home Medications    Prior to Admission medications   Medication Sig Start Date End Date Taking? Authorizing Provider  acetaminophen (TYLENOL) 500 MG tablet Take 1,000 mg by mouth 2 (two) times daily.     [provider]  amiodarone (PACERONE) 200 MG tablet TAKE 1 TABLET DAILY 11/12/19   Miquel Dunn, NP  apixaban (ELIQUIS) 2.5 MG TABS tablet Take 1 tablet (2.5 mg total) by mouth 2 (two) times daily. 10/01/14   Angiulli, Lavon Paganini, PA-C  furosemide (LASIX) 20 MG tablet Take 1 tablet (20 mg total) by mouth daily. 02/18/19   Adrian Prows, MD  gabapentin (NEURONTIN) 100 MG capsule Take 200 mg by mouth 2 (two) times daily. 12/27/18   [provider]  hydrALAZINE (APRESOLINE) 25 MG tablet TAKE 1 TABLET THREE TIMES A DAY 11/04/19   Adrian Prows, MD  ibuprofen (ADVIL) 200 MG tablet Take 400-600 mg by mouth every 6 (six) hours as needed for headache or mild pain.    [provider]  levothyroxine (SYNTHROID) 50 MCG tablet Take 1 tablet (50 mcg total) by mouth daily before breakfast. Patient taking differently: Take 25 mcg by mouth 2 (two) times daily.  10/07/19 04/22/20  Darliss Cheney, MD  lisinopril (PRINIVIL,ZESTRIL) 10 MG tablet Take 1 tablet (10 mg total) by mouth daily. 03/05/19 04/22/20  Adrian Prows, MD  metoprolol succinate (TOPROL-XL) 100 MG 24 hr tablet Take 100 mg by mouth daily. 03/04/18   [provider]    Family History Family History  Problem Relation Age of Onset  . Heart disease Mother   . Hypertension Mother   . Pancreatic cancer Mother   . Cancer Mother        pancreatic  . Pancreatic cancer Father   . Cancer Father        pancreatic  . Diabetes Brother   . Pancreatic cancer Other   . Heart disease Brother        Enlarged heart    Social History Social History   Tobacco Use  . Smoking status: Never Smoker  . Smokeless tobacco: Never Used  Substance Use Topics    . Alcohol use: Not Currently  . Drug use: Never     Allergies   Codeine, Morphine and related, Sulfa antibiotics, and Ceftaroline   Review of Systems Review of Systems  Reason unable to perform ROS: See HPI as above.     Physical Exam Triage Vital Signs ED Triage Vitals  Enc Vitals Group     BP 05/02/20 0901 108/68     Pulse Rate 05/02/20 0901 71     Resp 05/02/20 0901 16     Temp 05/02/20 0901 97.8 F (36.6 C)     Temp  Source 05/02/20 0901 Oral     SpO2 05/02/20 0901 93 %     Weight --      Height --      Head Circumference --      Peak Flow --      Pain Score 05/02/20 0921 4     Pain Loc --      Pain Edu? --      Excl. in [place]? --    No data found.  Updated Vital Signs BP 108/68 (BP Location: Right Arm)   Pulse 71   Temp 97.8 F (36.6 C) (Oral)   Resp 16   SpO2 93%   Physical Exam Constitutional:      General: She is not in acute distress.    Appearance: Normal appearance. She is well-developed. She is not toxic-appearing or diaphoretic.  HENT:     Head: Normocephalic and atraumatic.  Eyes:     Conjunctiva/sclera: Conjunctivae normal.     Pupils: Pupils are equal, round, and reactive to light.  Pulmonary:     Effort: Pulmonary effort is normal. No respiratory distress.     Comments: Speaking in full sentences without difficulty Musculoskeletal:     Cervical back: Normal range of motion and neck supple.     Comments: 6cm V shaped superficial laceration to right dorsal hand. Bleeding controlled with pressure. Full ROM of wrist, fingers. Strength 5/5, NVI  Skin:    General: Skin is warm and dry.  Neurological:     Mental Status: She is alert and oriented to person, place, and time.    UC Treatments / Results  Labs (all labs ordered are listed, but only abnormal results are displayed) Labs Reviewed - No data to display  EKG   Radiology No results found.  Procedures Laceration Repair  Date/Time: 05/02/2020 1:52 PM Performed by: Ok Edwards,  PA-C Authorized by: Ok Edwards, PA-C   Consent:    Consent obtained:  Verbal   Consent given by:  Patient   Risks discussed:  Infection, pain, poor cosmetic result and poor wound healing   Alternatives discussed:  No treatment Anesthesia (see MAR for exact dosages):    Anesthesia method:  Topical application   Topical anesthetic:  LET Laceration details:    Location:  Hand   Hand location:  R hand, dorsum   Length (cm):  6   Depth (mm):  1 Repair type:    Repair type:  Simple Pre-procedure details:    Preparation:  Patient was prepped and draped in usual sterile fashion Exploration:    Hemostasis achieved with:  LET   Wound exploration: wound explored through full range of motion and entire depth of wound probed and visualized     Contaminated: no   Treatment:    Area cleansed with:  Saline   Amount of cleaning:  Standard   Irrigation solution:  Sterile saline   Irrigation method:  Pressure wash   Visualized foreign bodies/material removed: no   Skin repair:    Repair method:  Steri-Strips   Number of Steri-Strips:  12 Approximation:    Approximation:  Close Post-procedure details:    Dressing:  Bulky dressing   Patient tolerance of procedure:  Tolerated well, no immediate complications   (including critical care time)  Medications Ordered in UC Medications  lidocaine-EPINEPHrine-tetracaine (LET) topical gel (has no administration in time range)    Initial Impression / Assessment and Plan / UC Course  I have reviewed the triage vital signs  and the nursing notes.  Pertinent labs & imaging results that were available during my care of the patient were reviewed by me and considered in my medical decision making (see chart for details).    Patient tolerated procedure well. 12 steristrips placed. Wound care instructions given. Return precautions given. Patient expresses understanding and agrees to plan.   Final Clinical Impressions(s) / UC Diagnoses   Final  diagnoses:  Laceration of right hand without foreign body, initial encounter   ED Prescriptions    None     PDMP not reviewed this encounter.   Ok Edwards, PA-C 05/02/20 1355

## 2020-05-02 NOTE — Discharge Instructions (Signed)
12 steristrips applied. Allow it to stay on for at least 7-10 days. Keep area clean and dry, the steristrips will fall off on own. Monitor for spreading redness, warmth, fever, follow up for reevaluation.

## 2020-05-21 ENCOUNTER — Ambulatory Visit
Admission: EM | Admit: 2020-05-21 | Discharge: 2020-05-21 | Disposition: A | Discharge status: Home / Self Care | Payer: Medicare Other | Attending: Emergency Medicine | Admitting: Emergency Medicine

## 2020-05-21 DIAGNOSIS — R300 Dysuria: Secondary | ICD-10-CM | POA: Diagnosis not present

## 2020-05-21 DIAGNOSIS — L03116 Cellulitis of left lower limb: Secondary | ICD-10-CM

## 2020-05-21 LAB — POCT URINALYSIS DIP (MANUAL ENTRY)
Bilirubin, UA: NEGATIVE
Blood, UA: NEGATIVE
Glucose, UA: NEGATIVE mg/dL
Ketones, POC UA: NEGATIVE mg/dL
Nitrite, UA: NEGATIVE
Protein Ur, POC: NEGATIVE mg/dL
Spec Grav, UA: 1.01 (ref 1.010–1.025)
Urobilinogen, UA: 0.2 E.U./dL
pH, UA: 5.5 (ref 5.0–8.0)

## 2020-05-21 MED ORDER — DOXYCYCLINE HYCLATE 100 MG PO CAPS: 100.0000 mg | ORAL_CAPSULE | Freq: Two times a day (BID) | ORAL | 0 refills | Status: DC

## 2020-05-21 NOTE — ED Provider Notes (Signed)
[facility]    CSN: [id number] Arrival date & time: 05/21/20  1216      History   Chief Complaint Chief Complaint  Patient presents with  . Urinary Tract Infection  . Wound Check    HPI [name] is a 86 y.o. female with extensive medical history as outlined below including CHF, breast cancer, right BKA, and urinary incontinence presenting with her daughter in law/caregiver for 3-day course of urinary frequency, urgency, burning.  Denies hematuria, back or belly pain, vaginal discharge, fever.  Patient also notes wound to left lower extremity x10 days.  Has noticed increasing swelling.  Denies claudication, trauma to area.  No arthralgias, myalgias that are new for her.  States breathing is at baseline.  Denies chest pain.  Has not missed doses of Eliquis.   Past Medical History:  Diagnosis Date  . Acute on chronic diastolic CHF (congestive heart failure) ([place]) 05/18/2015  . Acute respiratory failure ([place]) 06/09/2016  . Anemia   . Arthritis   . Arthritis pain   . Asthma   . Blood transfusion   . Bronchitis   . Cancer ([place])    lt breast  . CHF (congestive heart failure) ([place])   . Constipation   . Current use of long term anticoagulation   . Cystitis   . Encounter for care of pacemaker 06/13/2019  . Heart block    Complete heart block post surgical requiring pacer  . Humerus shaft fracture    nonsurgical, June 2017  . Hypertr obst cardiomyop    Required septal myotomy  . Hypokalemia    Postoperative, improved  . Hyponatremia    Mild postoperative, improved  . Memory difficulty   . Mitral regurgitation   . Pacemaker   . Urinary incontinence     Patient Active Problem List   Diagnosis Date Noted  . Encephalopathy 10/02/2019  . Elevated troponin 10/02/2019  . AMS (altered mental status) 10/02/2019  . Heart block   . Encounter for care of pacemaker 06/13/2019  . Right carotid bruit 02/18/2019  . CKD (chronic kidney disease) 01/29/2019  .  Hypoglycemia 01/29/2019  . CHF (congestive heart failure) ([place]) 01/28/2019  . Rt Olecranon fracture 04/07/2018  . Essential hypertension 04/07/2018  . Rt Hip Fracture ([place]) 04/06/2018  . Obstructive lung disease (generalized) ([place]) 10/04/2017  . Humerus fracture 06/09/2016  . Hx of right BKA ([place]) 06/09/2016  . Malnutrition of moderate degree 06/09/2016  . Unilateral AKA ([place]) 09/23/2014  . Chronic atrial fibrillation ([place]) 08/12/2014  . Hypertension 01/17/2014  . Cardiac pacemaker in situ 01/17/2014  . Heart block AV complete ([place]) 07/03/2013  . Atrial fibrillation ([place]) 07/03/2013  . Breast cancer, left breast ([place]) 04/24/2013  . Other symptoms involving cardiovascular system 12/22/2010  . MITRAL REGURGITATION 06/17/2010  . HOCM (hypertrophic obstructive cardiomyopathy) ([place]) 06/17/2010  . History of heart block 06/17/2010  . HEMORRHOIDS 06/17/2010  . BRONCHITIS 06/17/2010  . ASTHMA 06/17/2010  . CYSTITIS 06/17/2010  . URINARY INCONTINENCE 06/17/2010  . Pacemaker Medtronic Adapta dual-chamber pacemaker 2006, generator change 11/27/2013, programmed to VVI  06/21/2005    Past Surgical History:  Procedure Laterality Date  . ABDOMINAL HYSTERECTOMY    . ABOVE KNEE LEG AMPUTATION Right   . Anterior cervical diskectomy and fusion     C-7  . BELOW KNEE LEG AMPUTATION Right   . BREAST BIOPSY     Left, negative  . BREAST LUMPECTOMY  2012   left breast  . cardiomyopathy    .  COLONOSCOPY    . FEMUR FRACTURE SURGERY     Right  . FRACTURE SURGERY    . JOINT REPLACEMENT    . ORIF ELBOW FRACTURE Right 04/08/2018   Procedure: OPEN REDUCTION INTERNAL FIXATION (ORIF) ELBOW/OLECRANON FRACTURE;  Surgeon: Renette Butters, MD;  Location: [facility];  Service: Orthopedics;  Laterality: Right;  . PACEMAKER PLACEMENT  2006/11-27-2013   MDT dual chamber pacemaker implanted 2006 with gen change by Dr Lovena Le 11-27-2013  . PERMANENT PACEMAKER GENERATOR CHANGE N/A 11/27/2013   Procedure: PERMANENT  PACEMAKER GENERATOR CHANGE;  Surgeon: Evans Lance, MD;  Location: [facility] CATH LAB;  Service: Cardiovascular;  Laterality: N/A;  . REPLACEMENT TOTAL KNEE BILATERAL    . Septal myotomy    . SHOULDER SURGERY     Right  . WRIST FRACTURE SURGERY     Right    OB History   No obstetric history on file.      Home Medications    Prior to Admission medications   Medication Sig Start Date End Date Taking? Authorizing Provider  acetaminophen (TYLENOL) 500 MG tablet Take 1,000 mg by mouth 2 (two) times daily.     [provider]  amiodarone (PACERONE) 200 MG tablet TAKE 1 TABLET DAILY 11/12/19   Miquel Dunn, NP  apixaban (ELIQUIS) 2.5 MG TABS tablet Take 1 tablet (2.5 mg total) by mouth 2 (two) times daily. 10/01/14   Angiulli, Lavon Paganini, PA-C  doxycycline (VIBRAMYCIN) 100 MG capsule Take 1 capsule (100 mg total) by mouth 2 (two) times daily. 05/21/20   Hall-Potvin, [place], PA-C  furosemide (LASIX) 20 MG tablet Take 1 tablet (20 mg total) by mouth daily. 02/18/19   Adrian Prows, MD  gabapentin (NEURONTIN) 100 MG capsule Take 200 mg by mouth 2 (two) times daily. 12/27/18   [provider]  hydrALAZINE (APRESOLINE) 25 MG tablet TAKE 1 TABLET THREE TIMES A DAY 11/04/19   Adrian Prows, MD  ibuprofen (ADVIL) 200 MG tablet Take 400-600 mg by mouth every 6 (six) hours as needed for headache or mild pain.    [provider]  levothyroxine (SYNTHROID) 50 MCG tablet Take 1 tablet (50 mcg total) by mouth daily before breakfast. Patient taking differently: Take 25 mcg by mouth 2 (two) times daily.  10/07/19 04/22/20  Darliss Cheney, MD  lisinopril (PRINIVIL,ZESTRIL) 10 MG tablet Take 1 tablet (10 mg total) by mouth daily. 03/05/19 04/22/20  Adrian Prows, MD  metoprolol succinate (TOPROL-XL) 100 MG 24 hr tablet Take 100 mg by mouth daily. 03/04/18   [provider]    Family History Family History  Problem Relation Age of Onset  . Heart disease Mother   . Hypertension Mother     . Pancreatic cancer Mother   . Cancer Mother        pancreatic  . Pancreatic cancer Father   . Cancer Father        pancreatic  . Diabetes Brother   . Pancreatic cancer Other   . Heart disease Brother        Enlarged heart    Social History Social History   Tobacco Use  . Smoking status: Never Smoker  . Smokeless tobacco: Never Used  Substance Use Topics  . Alcohol use: Not Currently  . Drug use: Never     Allergies   Codeine, Morphine and related, Sulfa antibiotics, and Ceftaroline   Review of Systems As per HPI   Physical Exam Triage Vital Signs ED Triage Vitals  Enc Vitals Group  BP      Pulse      Resp      Temp      Temp src      SpO2      Weight      Height      Head Circumference      Peak Flow      Pain Score      Pain Loc      Pain Edu?      Excl. in [place]?    No data found.  Updated Vital Signs BP 111/72 (BP Location: Left Arm)   Pulse 73   Temp 98.2 F (36.8 C) (Oral)   Resp 18   SpO2 91%   Visual Acuity Right Eye Distance:   Left Eye Distance:   Bilateral Distance:    Right Eye Near:   Left Eye Near:    Bilateral Near:     Physical Exam Constitutional:      General: She is not in acute distress. HENT:     Head: Normocephalic and atraumatic.  Eyes:     General: No scleral icterus.    Pupils: Pupils are equal, round, and reactive to light.  Cardiovascular:     Rate and Rhythm: Normal rate.  Pulmonary:     Effort: Pulmonary effort is normal.  Abdominal:     Tenderness: There is no right CVA tenderness or left CVA tenderness.  Musculoskeletal:     Left lower leg: Edema present.     Comments: Patient with right BKA  Skin:    Coloration: Skin is not jaundiced or pale.     Comments: 3 cm superficial erosion of skin with surrounding erythema, edema, tenderness.  Serous discharge noted.  No malodor or purulence.  No streaking.  Neurological:     Mental Status: She is alert and oriented to person, place, and time.       UC Treatments / Results  Labs (all labs ordered are listed, but only abnormal results are displayed) Labs Reviewed  POCT URINALYSIS DIP (MANUAL ENTRY) - Abnormal; Notable for the following components:      Result Value   Leukocytes, UA Trace (*)    All other components within normal limits  URINE CULTURE    EKG   Radiology No results found.  Procedures Procedures (including critical care time)  Medications Ordered in UC Medications - No data to display  Initial Impression / Assessment and Plan / UC Course  I have reviewed the triage vital signs and the nursing notes.  Pertinent labs & imaging results that were available during my care of the patient were reviewed by me and considered in my medical decision making (see chart for details).     Patient febrile, nontoxic in office today.  Urine dipstick significant for trace leukocytes: We will defer to culture prior to treating.  Patient does have superficial breakdown of skin second to cellulitis: We will start doxycycline, have patient follow-up closely with PCP.  Wound dressed in office which patient tolerated well.  Discussed signs/symptoms of DVT as well as CHF.  Return precautions discussed.  Patient and caregiver verbalized understanding and are agreeable to plan. Final Clinical Impressions(s) / UC Diagnoses   Final diagnoses:  Cellulitis of left anterior lower leg  Dysuria     Discharge Instructions     Antibiotic with food/milk twice daily for 10 days. Important to call your PCP to schedule close follow-up. You may return here for repeat evaluation if unable to  do so. Urine culture pending: We will call you if treatment needs to change. Go to ER for worsening swelling, pain, fever, difficulty breathing, chest pain.    ED Prescriptions    Medication Sig Dispense Auth. Provider   doxycycline (VIBRAMYCIN) 100 MG capsule Take 1 capsule (100 mg total) by mouth 2 (two) times daily. 20 capsule Hall-Potvin,  [place], PA-C     PDMP not reviewed this encounter.   Hall-Potvin, [place], [place] 05/21/20 1434

## 2020-05-21 NOTE — Discharge Instructions (Addendum)
Antibiotic with food/milk twice daily for 10 days. Important to call your PCP to schedule close follow-up. You may return here for repeat evaluation if unable to do so. Urine culture pending: We will call you if treatment needs to change. Go to ER for worsening swelling, pain, fever, difficulty breathing, chest pain.

## 2020-05-21 NOTE — ED Triage Notes (Signed)
Pt c/o urinary frequency, urgency, and burning x3 days. Pt has a wound to LLE x10 days with swelling over the past 3 days.

## 2020-05-23 ENCOUNTER — Telehealth: Payer: Self-pay | Admitting: Emergency Medicine

## 2020-05-23 LAB — URINE CULTURE: Culture: >=100000 — AB

## 2020-05-23 MED ORDER — CIPROFLOXACIN HCL 500 MG PO TABS: 500.0000 mg | ORAL_TABLET | Freq: Two times a day (BID) | ORAL | 0 refills | Status: AC

## 2020-05-23 NOTE — Telephone Encounter (Signed)
Called patient to relay urine culture results: Positive for Klebsiella pneumonia sensitive to ciprofloxacin.  Estimated creatinine clearance per C-G formula 34 mL/min 500 mg twice daily x5 days, have patient follow-up with PCP.  Patient states her leg is better: Will stop doxycycline.  Patient already took this morning's dose, will start ciprofloxacin this evening.  Patient verbalized understanding, no additional questions at this time.

## 2020-05-27 DIAGNOSIS — S80812D Abrasion, left lower leg, subsequent encounter: Secondary | ICD-10-CM | POA: Diagnosis not present

## 2020-05-27 DIAGNOSIS — G546 Phantom limb syndrome with pain: Secondary | ICD-10-CM | POA: Diagnosis not present

## 2020-06-03 ENCOUNTER — Telehealth: Payer: Self-pay

## 2020-06-03 NOTE — Telephone Encounter (Signed)
Telephone call to patients niece [name] to reschedule palliative care visit from Friday 06/05/20 to Thursday 06/04/20 at 9:00 AM.  [name] in agreement with visit on Thursday 06/04/20 at 9:00 AM.

## 2020-06-04 ENCOUNTER — Other Ambulatory Visit: Payer: Medicare Other

## 2020-06-04 ENCOUNTER — Other Ambulatory Visit: Payer: Self-pay

## 2020-06-04 NOTE — Progress Notes (Unsigned)
PATIENT NAME: [name] DOB: 20-Aug-1931 MRN: [id number]  PRIMARY CARE PROVIDER: Jani Gravel, MD  RESPONSIBLE PARTY:  Acct ID - Guarantor Home Phone Work Phone Relationship Acct Type  [id number] Malva [phone]  Self P/F     [place], [address], [place] [address]    PLAN OF CARE and INTERVENTIONS:               1.  GOALS OF CARE/ ADVANCE CARE PLANNING:  Remain at home with her stepson Deryl and his wife [name].               2.  PATIENT/CAREGIVER EDUCATION:  Education on palliative care services, education on fall precautions, education on s/s of infection, reviewed meds, support               4. PERSONAL EMERGENCY PLAN:  Patient has [facility] in place.                5.  DISEASE STATUS: RN made scheduled palliative care home visit. Nurse met with patients daughter-in-law [name] and patients niece Mitzi Hansen. Patients stepson Darrell and Moneta as well as patient reside in the home. Patient lost her husband 1 month ago. Patients niece Mitzi Hansen is very supportive. Patient has very poor short-term memory. Patient has a below right knee amputation. Patient alert to self and familiars. Patient's past medical history includes but not limited to mitral regurgitation, hypertrophic  Obstructive cardiomyopathy, heart block complete, A-fib, hypertension, CHF, bronchitis, obstructive lung disease, hypoglycemia, encephalopathy, history of humerus fracture, history of right hip fracture, cystitis, CKD, Right BKA, asthma and urinary incontinence. Patient also has pacemaker.  Patient has not suffered any recent falls.  Patient is wheelchair-bound. Family aware patient is a fall risk. Patient reports her weight is approximately 115 lbs and she is 5 3. Patient had joint replacement 5 to 6 years ago and which did not heal resulting in amputation. Patient recently treated for cellulitis on left leg. Patient has two scabbed over areas on left leg and left leg is slightly swollen with some reddness.  Patient also  set out in the sun and nurse feels patient may have developed sun poison which is healing.  Patient has scabbed areas on arms as well as her nose. Patient has a lift chair in the home but refuses to use. Patient denies pain at the present time. Patient informed nurse she does have back pain at times when she first wakes up. Daughter-in-law [name] reports patient eats one good meal a day and that is at dinner. Patient does drink plenty of liquids. Patient has some shortness of breath at times however denies having any cough. Patient breath sounds are clear. Patient reports she also was treated for UT I recently with doxycycline. Patient sleeps on sofa most of the time. Patients vital signs are stable. Patients niece  interested in patient receiving bereavement services due to recent death of her husband.  Nurse will request social worker follow up with bereavement referral as well as Advanced Care directives. Patient wishes to remain a Full Code but is interested in completing MOST form and HCPOA.  Patient has had both of her CoVid 19 vaccines. Patient has Life Alert. [name] and Darrell do leave patient at home for a few hours at times. Family took patient to [place] recently and are planning a trip to the beach in the near future. Nurse  reviewed patient's medications with Cindy. Family to schedule appointment with neurologist due to patients worsening  memory issues.  Patient and family in agreement with palliative care services. Patient family encouraged to contact palliative care with questions or concerns.      HISTORY OF PRESENT ILLNESS:  Patient is a 86 year old female who resides in home with her stepson Derryl and his wife [name].  Patient and family open to palliative care services.  Patient will be seen monthly and PRN.      CODE STATUS: Full Code  ADVANCED DIRECTIVES: N MOST FORM: No PPS: 40%   PHYSICAL EXAM:   VITALS: Today's Vitals   06/04/20 0922  Resp: 16  Weight: 115 lb (52.2 kg)   Height: 5' 3"  (1.6 m)  PainSc: 0-No pain    LUNGS: clear to auscultation  CARDIAC: Cor RRR  EXTREMITIES: 1+ edema in LLE, patient recently treated for cellulitis of LLE    SKIN: multiple scabbed areas on arms and 2 wounds on LLE   NEURO: positive for gait problems, memory problems and weakness       Nilda Simmer, RN

## 2020-06-29 ENCOUNTER — Telehealth: Payer: Self-pay

## 2020-06-29 NOTE — Telephone Encounter (Signed)
SW LVM for patients niece, Mitzi Hansen [phone], to schedule palliavive care visit.

## 2020-06-30 ENCOUNTER — Telehealth: Payer: Self-pay

## 2020-06-30 DIAGNOSIS — N390 Urinary tract infection, site not specified: Secondary | ICD-10-CM | POA: Diagnosis not present

## 2020-06-30 DIAGNOSIS — E785 Hyperlipidemia, unspecified: Secondary | ICD-10-CM | POA: Diagnosis not present

## 2020-06-30 DIAGNOSIS — I10 Essential (primary) hypertension: Secondary | ICD-10-CM | POA: Diagnosis not present

## 2020-06-30 DIAGNOSIS — E039 Hypothyroidism, unspecified: Secondary | ICD-10-CM | POA: Diagnosis not present

## 2020-06-30 NOTE — Telephone Encounter (Signed)
Palliative Care SW LVM for patients daughter, Mitzi Hansen [phone], to schedule in home visit. Awaiting return call.

## 2020-07-08 ENCOUNTER — Telehealth: Payer: Self-pay

## 2020-07-08 NOTE — Telephone Encounter (Signed)
Mobile number for patient spouse is no longer in service.

## 2020-07-08 NOTE — Telephone Encounter (Signed)
Palliative Care SW LVM for patient to scheudled in home visit. Awaiting return call.

## 2020-07-13 DIAGNOSIS — G546 Phantom limb syndrome with pain: Secondary | ICD-10-CM | POA: Diagnosis not present

## 2020-07-13 DIAGNOSIS — Z0000 Encounter for general adult medical examination without abnormal findings: Secondary | ICD-10-CM | POA: Diagnosis not present

## 2020-07-13 DIAGNOSIS — E039 Hypothyroidism, unspecified: Secondary | ICD-10-CM | POA: Diagnosis not present

## 2020-07-13 DIAGNOSIS — I421 Obstructive hypertrophic cardiomyopathy: Secondary | ICD-10-CM | POA: Diagnosis not present

## 2020-07-13 DIAGNOSIS — N390 Urinary tract infection, site not specified: Secondary | ICD-10-CM | POA: Diagnosis not present

## 2020-07-13 DIAGNOSIS — Z950 Presence of cardiac pacemaker: Secondary | ICD-10-CM | POA: Diagnosis not present

## 2020-07-13 DIAGNOSIS — N183 Chronic kidney disease, stage 3 unspecified: Secondary | ICD-10-CM | POA: Diagnosis not present

## 2020-07-13 DIAGNOSIS — I4891 Unspecified atrial fibrillation: Secondary | ICD-10-CM | POA: Diagnosis not present

## 2020-07-13 DIAGNOSIS — I2510 Atherosclerotic heart disease of native coronary artery without angina pectoris: Secondary | ICD-10-CM | POA: Diagnosis not present

## 2020-09-14 ENCOUNTER — Telehealth: Payer: Self-pay

## 2020-09-14 NOTE — Telephone Encounter (Signed)
120 pm.  Phone call made to Mitzi Hansen to follow up on patient status.  [name] states patient is doing okay but she is not able to discuss patient further at this time.  I have given her my call back number and she will call when she has more time.  PLAN:  Awaiting for call back but will call again next month for follow up.

## 2020-10-09 ENCOUNTER — Telehealth: Payer: Self-pay

## 2020-10-09 NOTE — Telephone Encounter (Signed)
1120 am.  Phone call made to Mitzi Hansen to follow up on patient status.  Cindy answered the phone but is unable to talk long as she is on the other line.  I asked about calling the home phone to speak with the patient or spouse but she requested that I not do this due to the confusion it will cause. I have provided a callback number for her to call when she is able to speak for a longer time.

## 2020-10-23 DIAGNOSIS — Z45018 Encounter for adjustment and management of other part of cardiac pacemaker: Secondary | ICD-10-CM | POA: Diagnosis not present

## 2020-10-23 DIAGNOSIS — Z950 Presence of cardiac pacemaker: Secondary | ICD-10-CM | POA: Diagnosis not present

## 2020-10-23 DIAGNOSIS — I442 Atrioventricular block, complete: Secondary | ICD-10-CM | POA: Diagnosis not present

## 2020-10-23 DIAGNOSIS — I5042 Chronic combined systolic (congestive) and diastolic (congestive) heart failure: Secondary | ICD-10-CM | POA: Diagnosis not present

## 2020-10-27 ENCOUNTER — Telehealth: Payer: Self-pay

## 2020-10-27 NOTE — Telephone Encounter (Signed)
12:46PM: Palliative care SW outreached patient/family for monthly telephonic visit.  Patient's daughter, Mitzi Hansen [phone], answered first call, but call was disconnected. SW outreached again and left HIPPA complaint VM. Awaiting return call.  Will continue to offer palliative care support.

## 2020-10-28 ENCOUNTER — Encounter: Payer: Self-pay | Admitting: Cardiology

## 2020-10-28 ENCOUNTER — Other Ambulatory Visit: Payer: Self-pay

## 2020-10-28 ENCOUNTER — Ambulatory Visit: Payer: Medicare Other | Admitting: Cardiology

## 2020-10-28 VITALS — BP 95/60 | HR 64 | Resp 16 | Ht 63.0 in | Wt 114.0 lb

## 2020-10-28 DIAGNOSIS — I442 Atrioventricular block, complete: Secondary | ICD-10-CM | POA: Diagnosis not present

## 2020-10-28 DIAGNOSIS — I4821 Permanent atrial fibrillation: Secondary | ICD-10-CM | POA: Diagnosis not present

## 2020-10-28 DIAGNOSIS — Z45018 Encounter for adjustment and management of other part of cardiac pacemaker: Secondary | ICD-10-CM

## 2020-10-28 DIAGNOSIS — Z950 Presence of cardiac pacemaker: Secondary | ICD-10-CM | POA: Diagnosis not present

## 2020-10-28 NOTE — Progress Notes (Signed)
Primary Physician/Referring:  Jani Gravel, MD  Patient ID: [name], female    DOB: October 15, 1931, 86 y.o.   MRN: [id number]  Chief Complaint  Patient presents with  . Diastolic Heart Failure  . Cardiomyopathy  . Pacemaker Check  . Follow-up    1 year   HPI:    [name]  is a 86 y.o. Caucasian female  with complete heart block S/P permanent pacemaker implantation on 11/27/2013, permanent atrial fibrillation on chronic anticoagulation with Eliquis,  hypertrophic cardiomyopathy status post septal ablation at [facility] sometime in 1308, Chronic systolic and diastolic heart failure due to burnt out HOCM, EF improved to  normal on medical therapy by echo in 01/2019, moderate pulmonary hypertension, severe COPD due to bronchial asthma, stage III chronic kidney disease and anemia of chronic disease.   Patient is accompanied by with her daughter-in-law [name] at today's office visit.  Since last office a year ago, patient states that she is doing well from a cardiovascular standpoint.  She has not had any hospitalizations or urgent care visits. Patient is tolerating her cardiac medications well without any side effects or intolerances.  Past Medical History:  Diagnosis Date  . Acute on chronic diastolic CHF (congestive heart failure) ([place]) 05/18/2015  . Acute respiratory failure ([place]) 06/09/2016  . Anemia   . Arthritis   . Arthritis pain   . Asthma   . Blood transfusion   . Bronchitis   . Cancer ([place])    lt breast  . CHF (congestive heart failure) ([place])   . Constipation   . Current use of long term anticoagulation   . Cystitis   . Encounter for care of pacemaker 06/13/2019  . Heart block    Complete heart block post surgical requiring pacer  . Humerus shaft fracture    nonsurgical, June 2017  . Hypertr obst cardiomyop    Required septal myotomy  . Hypokalemia    Postoperative, improved  . Hyponatremia    Mild postoperative, improved  . Memory  difficulty   . Mitral regurgitation   . Pacemaker   . Urinary incontinence    Past Surgical History:  Procedure Laterality Date  . ABDOMINAL HYSTERECTOMY    . ABOVE KNEE LEG AMPUTATION Right   . Anterior cervical diskectomy and fusion     C-7  . BELOW KNEE LEG AMPUTATION Right   . BREAST BIOPSY     Left, negative  . BREAST LUMPECTOMY  2012   left breast  . cardiomyopathy    . COLONOSCOPY    . FEMUR FRACTURE SURGERY     Right  . FRACTURE SURGERY    . JOINT REPLACEMENT    . ORIF ELBOW FRACTURE Right 04/08/2018   Procedure: OPEN REDUCTION INTERNAL FIXATION (ORIF) ELBOW/OLECRANON FRACTURE;  Surgeon: Renette Butters, MD;  Location: [facility];  Service: Orthopedics;  Laterality: Right;  . PACEMAKER PLACEMENT  2006/11-27-2013   MDT dual chamber pacemaker implanted 2006 with gen change by Dr Lovena Le 11-27-2013  . PERMANENT PACEMAKER GENERATOR CHANGE N/A 11/27/2013   Procedure: PERMANENT PACEMAKER GENERATOR CHANGE;  Surgeon: Evans Lance, MD;  Location: [facility] CATH LAB;  Service: Cardiovascular;  Laterality: N/A;  . REPLACEMENT TOTAL KNEE BILATERAL    . Septal myotomy    . SHOULDER SURGERY     Right  . WRIST FRACTURE SURGERY     Right   Family History  Problem Relation Age of Onset  . Heart disease Mother   .  Hypertension Mother   . Pancreatic cancer Mother   . Cancer Mother        pancreatic  . Pancreatic cancer Father   . Cancer Father        pancreatic  . Diabetes Brother   . Pancreatic cancer Other   . Heart disease Brother        Enlarged heart    Social History   Tobacco Use  . Smoking status: Never Smoker  . Smokeless tobacco: Never Used  Substance Use Topics  . Alcohol use: Not Currently   Marital Status: Married   ROS  Review of Systems  Constitutional: Negative for chills, decreased appetite, malaise/fatigue and weight gain.  Cardiovascular: Positive for dyspnea on exertion (stable). Negative for chest pain, claudication, leg swelling (right BKA) and syncope.   Endocrine: Negative for cold intolerance.  Hematologic/Lymphatic: Does not bruise/bleed easily.  Musculoskeletal: Positive for arthritis and back pain. Negative for joint swelling.  Gastrointestinal: Negative for abdominal pain, anorexia, change in bowel habit, hematochezia and melena.  Neurological: Negative for headaches and light-headedness.  Psychiatric/Behavioral: Negative for depression and substance abuse.   Objective  Blood pressure 95/60, pulse 64, resp. rate 16, height 5\' 3"  (1.6 m), weight 114 lb (51.7 kg), SpO2 92 %.  Vitals with BMI 10/28/2020 06/04/2020 05/21/2020  Height 5\' 3"  5\' 3"  -  Weight 114 lbs 115 lbs -  BMI 02.7 25.36 -  Systolic 95 644 034  Diastolic 60 58 72  Pulse 64 64 73     Physical Exam Vitals and nursing note reviewed.  Constitutional:      General: She is not in acute distress.    Appearance: She is underweight. She is not ill-appearing.  HENT:     Head: Atraumatic.  Neck:     Thyroid: No thyromegaly.     Vascular: No JVD.  Cardiovascular:     Rate and Rhythm: Normal rate and regular rhythm.     Pulses: Intact distal pulses.          Carotid pulses are 2+ on the right side and 2+ on the left side.      Radial pulses are 2+ on the right side and 2+ on the left side.       Femoral pulses are 2+ on the right side and 2+ on the left side.      Popliteal pulses are 0 on the left side. Right popliteal pulse not accessible.       Dorsalis pedis pulses are 1+ on the left side. Right dorsalis pedis pulse not accessible.       Posterior tibial pulses are 0 on the left side. Right posterior tibial pulse not accessible.     Heart sounds: Murmur heard. High-pitched blowing holosystolic murmur is present with a grade of 2/6 at the apex radiating to the apex.  No gallop.      Comments: Trace left leg edema. No JVD.   Pulmonary:     Effort: Pulmonary effort is normal. No accessory muscle usage or respiratory distress.     Breath sounds: Rales (bilateral  diffuse and extensive) present. No wheezing.  Abdominal:     General: Bowel sounds are normal.     Palpations: Abdomen is soft.  Musculoskeletal:        General: Deformity (right AKA) present. Normal range of motion.     Cervical back: Neck supple.  Skin:    General: Skin is warm and dry.  Neurological:     Mental Status:  She is alert.    Laboratory examination:   No results for input(s): NA, K, CL, CO2, GLUCOSE, BUN, CREATININE, CALCIUM, GFRNONAA, GFRAA in the last 8760 hours. CrCl cannot be calculated (Patient's most recent lab result is older than the maximum 21 days allowed.).  CMP Latest Ref Rng & Units 10/06/2019 10/05/2019 10/03/2019  Glucose 70 - 99 mg/dL 86 101(H) 81  BUN 8 - 23 mg/dL 20 19 11   Creatinine 0.44 - 1.00 mg/dL 0.97 1.01(H) 0.82  Sodium 135 - 145 mmol/L 143 138 141  Potassium 3.5 - 5.1 mmol/L 4.0 4.3 3.5  Chloride 98 - 111 mmol/L 109 104 101  CO2 22 - 32 mmol/L 24 25 24   Calcium 8.9 - 10.3 mg/dL 9.1 8.9 9.1  Total Protein 6.5 - 8.1 g/dL - - -  Total Bilirubin 0.3 - 1.2 mg/dL - - -  Alkaline Phos 38 - 126 U/L - - -  AST 15 - 41 U/L - - -  ALT 0 - 44 U/L - - -   CBC Latest Ref Rng & Units 10/06/2019 10/03/2019 10/02/2019  WBC 4.0 - 10.5 K/uL 7.0 8.6 11.9(H)  Hemoglobin 12.0 - 15.0 g/dL 14.3 14.0 14.9  Hematocrit 36 - 46 % 41.5 43.2 43.9  Platelets 150 - 400 K/uL 203 204 223   Lipid Panel  No results found for: CHOL, TRIG, HDL, CHOLHDL, VLDL, LDLCALC, LDLDIRECT HEMOGLOBIN A1C No results found for: HGBA1C, MPG TSH No results for input(s): TSH in the last 8760 hours.   External labs:  Cholesterol, total 142.000 M 06/20/2019 HDL 39.000 mg 06/30/2020 LDL-C 100.000 ca 06/30/2020 Triglycerides 83.000 mg 06/30/2020  A1C 5.100 % 06/20/2019 TSH 2.910 06/30/2020  Hemoglobin 14.300 g/d 10/06/2019 Platelets 203.000 K/ 10/06/2019  Creatinine, Serum 1.260 MG/ 06/30/2020 Potassium 4.000 mm 10/06/2019 ALT (SGPT) 13.000 IU/ 06/30/2020  INR 1.390  04/06/2018  Medications and allergies   Allergies  Allergen Reactions  . Codeine Nausea And Vomiting  . Morphine And Related Nausea And Vomiting  . Sulfa Antibiotics Nausea And Vomiting  . Ceftaroline Rash    Current Outpatient Medications on File Prior to Visit  Medication Sig Dispense Refill  . acetaminophen (TYLENOL) 500 MG tablet Take 1,000 mg by mouth 2 (two) times daily.     Marland Kitchen apixaban (ELIQUIS) 2.5 MG TABS tablet Take 1 tablet (2.5 mg total) by mouth 2 (two) times daily. 60 tablet 1  . furosemide (LASIX) 20 MG tablet Take 1 tablet (20 mg total) by mouth daily.    Marland Kitchen gabapentin (NEURONTIN) 100 MG capsule Take 200 mg by mouth 2 (two) times daily.    . hydrALAZINE (APRESOLINE) 25 MG tablet TAKE 1 TABLET THREE TIMES A DAY 270 tablet 3  . ibuprofen (ADVIL) 200 MG tablet Take 400-600 mg by mouth every 6 (six) hours as needed for headache or mild pain.    Marland Kitchen levothyroxine (SYNTHROID) 50 MCG tablet Take 1 tablet (50 mcg total) by mouth daily before breakfast. (Patient taking differently: Take 25 mcg by mouth 2 (two) times daily. ) 30 tablet 0  . metoprolol succinate (TOPROL-XL) 100 MG 24 hr tablet Take 100 mg by mouth daily.     No current facility-administered medications on file prior to visit.    Radiology:   No results found.  Cardiac Studies:   Remote pacemaker check 12/23/2019: There were 0 high ventricular rate episodes detected. Health trends do not demonstrate significant abnormality. Battery longevity is 7 years. RV pacing is 99.9 %.  Scheduled In office pacemaker check  10/30/2020: Single (S)/Dual (D)/BV: S. Presenting VP. Pacemaker dependant:  Yes. Underlying No escape at 30. AP NA%, VP 100%. AMS Episodes NA.  AT/AF burden 100% .  HVR 0.  Longevity 6.5 Years. Magnet rate: >85%. Lead measurements: Stable. Histogram: Low (L)/normal (N)/high (H)  N. Patient activity Low.   Observations: Normal function. Changes: None.    Echocardiogram 10/04/2019:  Normal LVEF,  65-70%.  Severe LVH.  Diastolic function not evaluated. Normal right ventricle systolic function and size.  Severe mitral and the calcification, moderate to severe mitral valve regurgitation.  Mild-to-moderate tricuspid regurgitation, moderate pulmonary hypertension, PA systolic pressure 52 mmHg.  EKG  04/22/2020: Ventricular paced rhythm.  Assessment     ICD-10-CM   1. Encounter for care of pacemaker  Z45.018   2. Pacemaker Medtronic Adapta dual-chamber pacemaker 2006, generator change 11/27/2013, programmed to VVI   Z95.0   3. Heart block AV complete (HCC)  I44.2   4. Permanent atrial fibrillation (HCC)  I48.21     No orders of the defined types were placed in this encounter.  Medications Discontinued During This Encounter  Medication Reason  . doxycycline (VIBRAMYCIN) 100 MG capsule Patient Preference  . lisinopril (PRINIVIL,ZESTRIL) 10 MG tablet Patient Preference  . amiodarone (PACERONE) 200 MG tablet Discontinued by provider     Recommendations:   DESTINA MANTEI  is a 86 y.o. Caucasian female  with complete heart block S/P permanent pacemaker implantation on 11/27/2013, permanent atrial fibrillation on chronic anticoagulation with Eliquis,  hypertrophic cardiomyopathy status post septal ablation at [facility] sometime in 8185, Chronic systolic and diastolic heart failure due to burnt out HOCM, EF improved to  normal on medical therapy by echo in 01/2019, moderate pulmonary hypertension, severe COPD due to bronchial asthma, stage III chronic kidney disease and anemia of chronic disease.   Patient is pacemaker dependent with underlying complete heart block and permanent atrial fibrillation presently on long-term anticoagulation with Eliquis, Patient is currently euvolemic and not in congestive heart failure.  Medications reconciled.  Continue guideline directed medical therapy. Patient is pacemaker dependent.  Recommend regular pacemaker follow-up.  I will see her  back in 1 year.  External labs reviewed.  Anticoagulation being managed by her PCP.  Blood pressures well controlled, no clinical evidence of heart failure, patient is stable on present medications.  She has been on amiodarone chronically, in view of improved LVEF, no high ventricular rates noted on the pacemaker interrogation, I have discontinued amiodarone.  She is also discontinued lisinopril due to low blood pressure, she is presently on hydralazine, in view of renal insufficiency I did not make any changes to her medications as well.  In spite of advanced age and multiple medical comorbidity including severe COPD and kyphoscoliosis, she has done well and has not had any recent hospitalizations.  As she is having some technical difficulty, I have not received any recent remote transmissions, I given her [employer] support telephone number with [phone].   Adrian Prows, MD, [facility] 10/28/2020, 5:48 PM Office: [phone] Pager: [phone]

## 2020-11-24 ENCOUNTER — Telehealth: Payer: Self-pay

## 2020-11-24 NOTE — Telephone Encounter (Signed)
9 am.  Phone call made to [facility] to complete a telephonic visit on patient.  [name] states she has not seen the patient recently due to a death in her family.  Based on her last visit, [name] states patient is doing poorly and feels her over all condition has worsened.  Patient is wheelchair bound.  Caregivers do not feel they are able to get patient out of her home due to worsening condition.  No behavioral issues are reported but memory has declined further.  Po intake has been poor and she believes there is weight loss but uncertain as to how much.  I offer to make a visit to further assess patient but [name] did not feel this was necessary at present.  She states she did not feel PC services would be helpful for patient when they really needed respite care for primary caregivers.  We discussed hospice care and if patient might qualify for these services.  I have provided education on Palliative Care vs Hospice.  [name] states she would need to speak to [place] and Darryl who are the caregivers to see how they feel about patient's overall condition.  There has been discussion amongst the family about moving patient to [place] where her son lives but this would likely not happen per [place].  She feels the family who are caring for patient are no longer able to do this due to caregiver fatigue.  I have discussed private care agencies to provide some respite to family.  Cindy asked for my call back number and states she will consult with the family to see if an in-person visit from [facility] is needed.

## 2021-01-01 ENCOUNTER — Telehealth: Payer: Self-pay

## 2021-01-01 NOTE — Telephone Encounter (Signed)
1200PM: Palliative care SW outreached patient to complete telephonic visit.   Palliative care SW outreached patient to complete telephonic visit. Patient's niece, [name], provided update on medical condition and/or changes. Per niece patients caregivers are transitioning her into an ALF community the first of February[place] Niece is not in favor of this decision. Per niece patient is more confused and more incontinent and patient has a wound on ankle. Niece is not sure of what ALF community caregivers are choosing, but knows it will be in [facility]. SW advised that PC can continue to provide services in ALF's as well. No other psychosocial needs. Niece appreciative of telephonic check in. Niece will be outreaching caregivers to obtain update on placement process and will keep SW updated.   Palliative care will continue to monitor and assist with long term care planning as needed.

## 2021-01-06 DIAGNOSIS — I129 Hypertensive chronic kidney disease with stage 1 through stage 4 chronic kidney disease, or unspecified chronic kidney disease: Secondary | ICD-10-CM | POA: Diagnosis not present

## 2021-01-06 DIAGNOSIS — E039 Hypothyroidism, unspecified: Secondary | ICD-10-CM | POA: Diagnosis not present

## 2021-01-06 DIAGNOSIS — Z79899 Other long term (current) drug therapy: Secondary | ICD-10-CM | POA: Diagnosis not present

## 2021-01-06 DIAGNOSIS — I421 Obstructive hypertrophic cardiomyopathy: Secondary | ICD-10-CM | POA: Diagnosis not present

## 2021-01-06 DIAGNOSIS — N390 Urinary tract infection, site not specified: Secondary | ICD-10-CM | POA: Diagnosis not present

## 2021-01-06 DIAGNOSIS — I4891 Unspecified atrial fibrillation: Secondary | ICD-10-CM | POA: Diagnosis not present

## 2021-01-11 DIAGNOSIS — N390 Urinary tract infection, site not specified: Secondary | ICD-10-CM | POA: Diagnosis not present

## 2021-01-13 DIAGNOSIS — Z89619 Acquired absence of unspecified leg above knee: Secondary | ICD-10-CM | POA: Diagnosis not present

## 2021-01-13 DIAGNOSIS — Z79899 Other long term (current) drug therapy: Secondary | ICD-10-CM | POA: Diagnosis not present

## 2021-01-13 DIAGNOSIS — I129 Hypertensive chronic kidney disease with stage 1 through stage 4 chronic kidney disease, or unspecified chronic kidney disease: Secondary | ICD-10-CM | POA: Diagnosis not present

## 2021-01-13 DIAGNOSIS — Z950 Presence of cardiac pacemaker: Secondary | ICD-10-CM | POA: Diagnosis not present

## 2021-01-13 DIAGNOSIS — Z23 Encounter for immunization: Secondary | ICD-10-CM | POA: Diagnosis not present

## 2021-01-13 DIAGNOSIS — I4891 Unspecified atrial fibrillation: Secondary | ICD-10-CM | POA: Diagnosis not present

## 2021-01-13 DIAGNOSIS — Z0289 Encounter for other administrative examinations: Secondary | ICD-10-CM | POA: Diagnosis not present

## 2021-01-13 DIAGNOSIS — R4189 Other symptoms and signs involving cognitive functions and awareness: Secondary | ICD-10-CM | POA: Diagnosis not present

## 2021-01-13 DIAGNOSIS — Z993 Dependence on wheelchair: Secondary | ICD-10-CM | POA: Diagnosis not present

## 2021-01-13 DIAGNOSIS — I421 Obstructive hypertrophic cardiomyopathy: Secondary | ICD-10-CM | POA: Diagnosis not present

## 2021-01-13 DIAGNOSIS — J449 Chronic obstructive pulmonary disease, unspecified: Secondary | ICD-10-CM | POA: Diagnosis not present

## 2021-01-25 DIAGNOSIS — M79672 Pain in left foot: Secondary | ICD-10-CM | POA: Diagnosis not present

## 2021-01-25 DIAGNOSIS — M19072 Primary osteoarthritis, left ankle and foot: Secondary | ICD-10-CM | POA: Diagnosis not present

## 2021-01-25 DIAGNOSIS — M8580 Other specified disorders of bone density and structure, unspecified site: Secondary | ICD-10-CM | POA: Diagnosis not present

## 2021-01-26 DIAGNOSIS — M545 Low back pain, unspecified: Secondary | ICD-10-CM | POA: Diagnosis not present

## 2021-01-26 DIAGNOSIS — M546 Pain in thoracic spine: Secondary | ICD-10-CM | POA: Diagnosis not present

## 2021-01-26 DIAGNOSIS — M5136 Other intervertebral disc degeneration, lumbar region: Secondary | ICD-10-CM | POA: Diagnosis not present

## 2021-01-26 DIAGNOSIS — M5134 Other intervertebral disc degeneration, thoracic region: Secondary | ICD-10-CM | POA: Diagnosis not present

## 2021-01-27 ENCOUNTER — Telehealth: Payer: Self-pay

## 2021-01-27 NOTE — Telephone Encounter (Signed)
10AM: [name] niece, [name], called to update team that patient has moved to [facility] in [place] this past Friday 2/4. The niece shared that [name] is not doing well and feels she is declining. The patient dropped a glass table on her foot and is in a great amount of pain, the ALF is ordering an X-ray, she also shared that she is more confused than normal.  The niece is requesting a visit be done as soon as possible by palliative care NP.

## 2021-01-28 DIAGNOSIS — I2729 Other secondary pulmonary hypertension: Secondary | ICD-10-CM | POA: Diagnosis not present

## 2021-01-28 DIAGNOSIS — N183 Chronic kidney disease, stage 3 unspecified: Secondary | ICD-10-CM | POA: Diagnosis not present

## 2021-01-28 DIAGNOSIS — Z950 Presence of cardiac pacemaker: Secondary | ICD-10-CM | POA: Diagnosis not present

## 2021-01-28 DIAGNOSIS — M79672 Pain in left foot: Secondary | ICD-10-CM | POA: Diagnosis not present

## 2021-01-28 DIAGNOSIS — G546 Phantom limb syndrome with pain: Secondary | ICD-10-CM | POA: Diagnosis not present

## 2021-01-28 DIAGNOSIS — E038 Other specified hypothyroidism: Secondary | ICD-10-CM | POA: Diagnosis not present

## 2021-01-28 DIAGNOSIS — M5489 Other dorsalgia: Secondary | ICD-10-CM | POA: Diagnosis not present

## 2021-01-28 DIAGNOSIS — I422 Other hypertrophic cardiomyopathy: Secondary | ICD-10-CM | POA: Diagnosis not present

## 2021-01-28 DIAGNOSIS — F418 Other specified anxiety disorders: Secondary | ICD-10-CM | POA: Diagnosis not present

## 2021-01-28 DIAGNOSIS — I480 Paroxysmal atrial fibrillation: Secondary | ICD-10-CM | POA: Diagnosis not present

## 2021-01-28 DIAGNOSIS — I5189 Other ill-defined heart diseases: Secondary | ICD-10-CM | POA: Diagnosis not present

## 2021-01-28 DIAGNOSIS — I9589 Other hypotension: Secondary | ICD-10-CM | POA: Diagnosis not present

## 2021-01-29 DIAGNOSIS — Z89611 Acquired absence of right leg above knee: Secondary | ICD-10-CM | POA: Diagnosis not present

## 2021-01-29 DIAGNOSIS — Z950 Presence of cardiac pacemaker: Secondary | ICD-10-CM | POA: Diagnosis not present

## 2021-01-29 DIAGNOSIS — M778 Other enthesopathies, not elsewhere classified: Secondary | ICD-10-CM | POA: Diagnosis not present

## 2021-01-29 DIAGNOSIS — I509 Heart failure, unspecified: Secondary | ICD-10-CM | POA: Diagnosis not present

## 2021-01-29 DIAGNOSIS — M2142 Flat foot [pes planus] (acquired), left foot: Secondary | ICD-10-CM | POA: Diagnosis not present

## 2021-01-29 DIAGNOSIS — I422 Other hypertrophic cardiomyopathy: Secondary | ICD-10-CM | POA: Diagnosis not present

## 2021-01-29 DIAGNOSIS — M419 Scoliosis, unspecified: Secondary | ICD-10-CM | POA: Diagnosis not present

## 2021-01-29 DIAGNOSIS — Z7901 Long term (current) use of anticoagulants: Secondary | ICD-10-CM | POA: Diagnosis not present

## 2021-01-29 DIAGNOSIS — M85872 Other specified disorders of bone density and structure, left ankle and foot: Secondary | ICD-10-CM | POA: Diagnosis not present

## 2021-01-29 DIAGNOSIS — M19072 Primary osteoarthritis, left ankle and foot: Secondary | ICD-10-CM | POA: Diagnosis not present

## 2021-01-29 DIAGNOSIS — J449 Chronic obstructive pulmonary disease, unspecified: Secondary | ICD-10-CM | POA: Diagnosis not present

## 2021-01-29 DIAGNOSIS — E039 Hypothyroidism, unspecified: Secondary | ICD-10-CM | POA: Diagnosis not present

## 2021-01-29 DIAGNOSIS — M47816 Spondylosis without myelopathy or radiculopathy, lumbar region: Secondary | ICD-10-CM | POA: Diagnosis not present

## 2021-01-29 DIAGNOSIS — M810 Age-related osteoporosis without current pathological fracture: Secondary | ICD-10-CM | POA: Diagnosis not present

## 2021-01-29 DIAGNOSIS — I4891 Unspecified atrial fibrillation: Secondary | ICD-10-CM | POA: Diagnosis not present

## 2021-01-29 DIAGNOSIS — Z9181 History of falling: Secondary | ICD-10-CM | POA: Diagnosis not present

## 2021-01-29 DIAGNOSIS — M47814 Spondylosis without myelopathy or radiculopathy, thoracic region: Secondary | ICD-10-CM | POA: Diagnosis not present

## 2021-01-29 DIAGNOSIS — I272 Pulmonary hypertension, unspecified: Secondary | ICD-10-CM | POA: Diagnosis not present

## 2021-01-29 DIAGNOSIS — M48061 Spinal stenosis, lumbar region without neurogenic claudication: Secondary | ICD-10-CM | POA: Diagnosis not present

## 2021-01-29 DIAGNOSIS — I442 Atrioventricular block, complete: Secondary | ICD-10-CM | POA: Diagnosis not present

## 2021-01-29 DIAGNOSIS — S81802D Unspecified open wound, left lower leg, subsequent encounter: Secondary | ICD-10-CM | POA: Diagnosis not present

## 2021-01-29 DIAGNOSIS — D631 Anemia in chronic kidney disease: Secondary | ICD-10-CM | POA: Diagnosis not present

## 2021-01-29 DIAGNOSIS — N182 Chronic kidney disease, stage 2 (mild): Secondary | ICD-10-CM | POA: Diagnosis not present

## 2021-01-29 DIAGNOSIS — M4804 Spinal stenosis, thoracic region: Secondary | ICD-10-CM | POA: Diagnosis not present

## 2021-01-30 DIAGNOSIS — M2142 Flat foot [pes planus] (acquired), left foot: Secondary | ICD-10-CM | POA: Diagnosis not present

## 2021-01-30 DIAGNOSIS — I509 Heart failure, unspecified: Secondary | ICD-10-CM | POA: Diagnosis not present

## 2021-01-30 DIAGNOSIS — J449 Chronic obstructive pulmonary disease, unspecified: Secondary | ICD-10-CM | POA: Diagnosis not present

## 2021-01-30 DIAGNOSIS — M85872 Other specified disorders of bone density and structure, left ankle and foot: Secondary | ICD-10-CM | POA: Diagnosis not present

## 2021-01-30 DIAGNOSIS — M19072 Primary osteoarthritis, left ankle and foot: Secondary | ICD-10-CM | POA: Diagnosis not present

## 2021-01-30 DIAGNOSIS — S81802D Unspecified open wound, left lower leg, subsequent encounter: Secondary | ICD-10-CM | POA: Diagnosis not present

## 2021-02-02 DIAGNOSIS — M2142 Flat foot [pes planus] (acquired), left foot: Secondary | ICD-10-CM | POA: Diagnosis not present

## 2021-02-02 DIAGNOSIS — M19072 Primary osteoarthritis, left ankle and foot: Secondary | ICD-10-CM | POA: Diagnosis not present

## 2021-02-02 DIAGNOSIS — S81802D Unspecified open wound, left lower leg, subsequent encounter: Secondary | ICD-10-CM | POA: Diagnosis not present

## 2021-02-02 DIAGNOSIS — M85872 Other specified disorders of bone density and structure, left ankle and foot: Secondary | ICD-10-CM | POA: Diagnosis not present

## 2021-02-02 DIAGNOSIS — J449 Chronic obstructive pulmonary disease, unspecified: Secondary | ICD-10-CM | POA: Diagnosis not present

## 2021-02-02 DIAGNOSIS — I509 Heart failure, unspecified: Secondary | ICD-10-CM | POA: Diagnosis not present

## 2021-02-04 DIAGNOSIS — S81802A Unspecified open wound, left lower leg, initial encounter: Secondary | ICD-10-CM | POA: Diagnosis not present

## 2021-02-04 DIAGNOSIS — S22060A Wedge compression fracture of T7-T8 vertebra, initial encounter for closed fracture: Secondary | ICD-10-CM | POA: Diagnosis not present

## 2021-02-04 DIAGNOSIS — S81802D Unspecified open wound, left lower leg, subsequent encounter: Secondary | ICD-10-CM | POA: Diagnosis not present

## 2021-02-04 DIAGNOSIS — M85872 Other specified disorders of bone density and structure, left ankle and foot: Secondary | ICD-10-CM | POA: Diagnosis not present

## 2021-02-04 DIAGNOSIS — M19072 Primary osteoarthritis, left ankle and foot: Secondary | ICD-10-CM | POA: Diagnosis not present

## 2021-02-04 DIAGNOSIS — I509 Heart failure, unspecified: Secondary | ICD-10-CM | POA: Diagnosis not present

## 2021-02-04 DIAGNOSIS — M5489 Other dorsalgia: Secondary | ICD-10-CM | POA: Diagnosis not present

## 2021-02-04 DIAGNOSIS — J449 Chronic obstructive pulmonary disease, unspecified: Secondary | ICD-10-CM | POA: Diagnosis not present

## 2021-02-04 DIAGNOSIS — M2142 Flat foot [pes planus] (acquired), left foot: Secondary | ICD-10-CM | POA: Diagnosis not present

## 2021-02-04 DIAGNOSIS — L03114 Cellulitis of left upper limb: Secondary | ICD-10-CM | POA: Diagnosis not present

## 2021-02-05 DIAGNOSIS — S81802D Unspecified open wound, left lower leg, subsequent encounter: Secondary | ICD-10-CM | POA: Diagnosis not present

## 2021-02-05 DIAGNOSIS — I509 Heart failure, unspecified: Secondary | ICD-10-CM | POA: Diagnosis not present

## 2021-02-05 DIAGNOSIS — M19072 Primary osteoarthritis, left ankle and foot: Secondary | ICD-10-CM | POA: Diagnosis not present

## 2021-02-05 DIAGNOSIS — M85872 Other specified disorders of bone density and structure, left ankle and foot: Secondary | ICD-10-CM | POA: Diagnosis not present

## 2021-02-05 DIAGNOSIS — J449 Chronic obstructive pulmonary disease, unspecified: Secondary | ICD-10-CM | POA: Diagnosis not present

## 2021-02-05 DIAGNOSIS — M2142 Flat foot [pes planus] (acquired), left foot: Secondary | ICD-10-CM | POA: Diagnosis not present

## 2021-02-08 ENCOUNTER — Telehealth: Payer: Self-pay | Admitting: *Deleted

## 2021-02-08 DIAGNOSIS — Z45018 Encounter for adjustment and management of other part of cardiac pacemaker: Secondary | ICD-10-CM | POA: Diagnosis not present

## 2021-02-08 DIAGNOSIS — M85872 Other specified disorders of bone density and structure, left ankle and foot: Secondary | ICD-10-CM | POA: Diagnosis not present

## 2021-02-08 DIAGNOSIS — I5042 Chronic combined systolic (congestive) and diastolic (congestive) heart failure: Secondary | ICD-10-CM | POA: Diagnosis not present

## 2021-02-08 DIAGNOSIS — J449 Chronic obstructive pulmonary disease, unspecified: Secondary | ICD-10-CM | POA: Diagnosis not present

## 2021-02-08 DIAGNOSIS — I442 Atrioventricular block, complete: Secondary | ICD-10-CM | POA: Diagnosis not present

## 2021-02-08 DIAGNOSIS — M2142 Flat foot [pes planus] (acquired), left foot: Secondary | ICD-10-CM | POA: Diagnosis not present

## 2021-02-08 DIAGNOSIS — Z950 Presence of cardiac pacemaker: Secondary | ICD-10-CM | POA: Diagnosis not present

## 2021-02-08 DIAGNOSIS — S81802D Unspecified open wound, left lower leg, subsequent encounter: Secondary | ICD-10-CM | POA: Diagnosis not present

## 2021-02-08 DIAGNOSIS — M19072 Primary osteoarthritis, left ankle and foot: Secondary | ICD-10-CM | POA: Diagnosis not present

## 2021-02-08 DIAGNOSIS — I509 Heart failure, unspecified: Secondary | ICD-10-CM | POA: Diagnosis not present

## 2021-02-08 NOTE — Telephone Encounter (Signed)
Called and spoke with patient's niece [name] to schedule a palliative care visit since patient has recently moved into a facility, [facility]. Niece would like to be present during visit. Visit scheduled for 02/10/21 @ 1p.

## 2021-02-09 ENCOUNTER — Telehealth: Payer: Self-pay | Admitting: *Deleted

## 2021-02-09 NOTE — Telephone Encounter (Signed)
3:46p Received a voicemail from patient's niece, [name], stating that she needs to cancel our palliative care visit we scheduled for tomorrow. She says she wants to hold off on a visit for now and that someone else in the family would more than likely call back to schedule a visit in the future.

## 2021-02-10 DIAGNOSIS — M85872 Other specified disorders of bone density and structure, left ankle and foot: Secondary | ICD-10-CM | POA: Diagnosis not present

## 2021-02-10 DIAGNOSIS — S81802D Unspecified open wound, left lower leg, subsequent encounter: Secondary | ICD-10-CM | POA: Diagnosis not present

## 2021-02-10 DIAGNOSIS — M19072 Primary osteoarthritis, left ankle and foot: Secondary | ICD-10-CM | POA: Diagnosis not present

## 2021-02-10 DIAGNOSIS — M2142 Flat foot [pes planus] (acquired), left foot: Secondary | ICD-10-CM | POA: Diagnosis not present

## 2021-02-10 DIAGNOSIS — I509 Heart failure, unspecified: Secondary | ICD-10-CM | POA: Diagnosis not present

## 2021-02-10 DIAGNOSIS — J449 Chronic obstructive pulmonary disease, unspecified: Secondary | ICD-10-CM | POA: Diagnosis not present

## 2021-02-11 DIAGNOSIS — M85872 Other specified disorders of bone density and structure, left ankle and foot: Secondary | ICD-10-CM | POA: Diagnosis not present

## 2021-02-11 DIAGNOSIS — L03116 Cellulitis of left lower limb: Secondary | ICD-10-CM | POA: Diagnosis not present

## 2021-02-11 DIAGNOSIS — S81802D Unspecified open wound, left lower leg, subsequent encounter: Secondary | ICD-10-CM | POA: Diagnosis not present

## 2021-02-11 DIAGNOSIS — M19072 Primary osteoarthritis, left ankle and foot: Secondary | ICD-10-CM | POA: Diagnosis not present

## 2021-02-11 DIAGNOSIS — R600 Localized edema: Secondary | ICD-10-CM | POA: Diagnosis not present

## 2021-02-11 DIAGNOSIS — I509 Heart failure, unspecified: Secondary | ICD-10-CM | POA: Diagnosis not present

## 2021-02-11 DIAGNOSIS — J449 Chronic obstructive pulmonary disease, unspecified: Secondary | ICD-10-CM | POA: Diagnosis not present

## 2021-02-11 DIAGNOSIS — M2142 Flat foot [pes planus] (acquired), left foot: Secondary | ICD-10-CM | POA: Diagnosis not present

## 2021-02-11 DIAGNOSIS — L03114 Cellulitis of left upper limb: Secondary | ICD-10-CM | POA: Diagnosis not present

## 2021-02-12 DIAGNOSIS — J449 Chronic obstructive pulmonary disease, unspecified: Secondary | ICD-10-CM | POA: Diagnosis not present

## 2021-02-12 DIAGNOSIS — M2142 Flat foot [pes planus] (acquired), left foot: Secondary | ICD-10-CM | POA: Diagnosis not present

## 2021-02-12 DIAGNOSIS — S81802D Unspecified open wound, left lower leg, subsequent encounter: Secondary | ICD-10-CM | POA: Diagnosis not present

## 2021-02-12 DIAGNOSIS — I509 Heart failure, unspecified: Secondary | ICD-10-CM | POA: Diagnosis not present

## 2021-02-12 DIAGNOSIS — M85872 Other specified disorders of bone density and structure, left ankle and foot: Secondary | ICD-10-CM | POA: Diagnosis not present

## 2021-02-12 DIAGNOSIS — M19072 Primary osteoarthritis, left ankle and foot: Secondary | ICD-10-CM | POA: Diagnosis not present

## 2021-02-15 DIAGNOSIS — M19072 Primary osteoarthritis, left ankle and foot: Secondary | ICD-10-CM | POA: Diagnosis not present

## 2021-02-15 DIAGNOSIS — M2142 Flat foot [pes planus] (acquired), left foot: Secondary | ICD-10-CM | POA: Diagnosis not present

## 2021-02-15 DIAGNOSIS — I509 Heart failure, unspecified: Secondary | ICD-10-CM | POA: Diagnosis not present

## 2021-02-15 DIAGNOSIS — M85872 Other specified disorders of bone density and structure, left ankle and foot: Secondary | ICD-10-CM | POA: Diagnosis not present

## 2021-02-15 DIAGNOSIS — J449 Chronic obstructive pulmonary disease, unspecified: Secondary | ICD-10-CM | POA: Diagnosis not present

## 2021-02-15 DIAGNOSIS — S81802D Unspecified open wound, left lower leg, subsequent encounter: Secondary | ICD-10-CM | POA: Diagnosis not present

## 2021-02-16 DIAGNOSIS — M2142 Flat foot [pes planus] (acquired), left foot: Secondary | ICD-10-CM | POA: Diagnosis not present

## 2021-02-16 DIAGNOSIS — J449 Chronic obstructive pulmonary disease, unspecified: Secondary | ICD-10-CM | POA: Diagnosis not present

## 2021-02-16 DIAGNOSIS — S81802D Unspecified open wound, left lower leg, subsequent encounter: Secondary | ICD-10-CM | POA: Diagnosis not present

## 2021-02-16 DIAGNOSIS — M19072 Primary osteoarthritis, left ankle and foot: Secondary | ICD-10-CM | POA: Diagnosis not present

## 2021-02-16 DIAGNOSIS — I509 Heart failure, unspecified: Secondary | ICD-10-CM | POA: Diagnosis not present

## 2021-02-16 DIAGNOSIS — M85872 Other specified disorders of bone density and structure, left ankle and foot: Secondary | ICD-10-CM | POA: Diagnosis not present

## 2021-02-17 DIAGNOSIS — J449 Chronic obstructive pulmonary disease, unspecified: Secondary | ICD-10-CM | POA: Diagnosis not present

## 2021-02-17 DIAGNOSIS — I509 Heart failure, unspecified: Secondary | ICD-10-CM | POA: Diagnosis not present

## 2021-02-17 DIAGNOSIS — M2142 Flat foot [pes planus] (acquired), left foot: Secondary | ICD-10-CM | POA: Diagnosis not present

## 2021-02-17 DIAGNOSIS — S81802D Unspecified open wound, left lower leg, subsequent encounter: Secondary | ICD-10-CM | POA: Diagnosis not present

## 2021-02-17 DIAGNOSIS — M19072 Primary osteoarthritis, left ankle and foot: Secondary | ICD-10-CM | POA: Diagnosis not present

## 2021-02-17 DIAGNOSIS — M85872 Other specified disorders of bone density and structure, left ankle and foot: Secondary | ICD-10-CM | POA: Diagnosis not present

## 2021-02-18 DIAGNOSIS — I509 Heart failure, unspecified: Secondary | ICD-10-CM | POA: Diagnosis not present

## 2021-02-18 DIAGNOSIS — M85872 Other specified disorders of bone density and structure, left ankle and foot: Secondary | ICD-10-CM | POA: Diagnosis not present

## 2021-02-18 DIAGNOSIS — M2142 Flat foot [pes planus] (acquired), left foot: Secondary | ICD-10-CM | POA: Diagnosis not present

## 2021-02-18 DIAGNOSIS — N183 Chronic kidney disease, stage 3 unspecified: Secondary | ICD-10-CM | POA: Diagnosis not present

## 2021-02-18 DIAGNOSIS — M19072 Primary osteoarthritis, left ankle and foot: Secondary | ICD-10-CM | POA: Diagnosis not present

## 2021-02-18 DIAGNOSIS — F419 Anxiety disorder, unspecified: Secondary | ICD-10-CM | POA: Diagnosis not present

## 2021-02-18 DIAGNOSIS — J449 Chronic obstructive pulmonary disease, unspecified: Secondary | ICD-10-CM | POA: Diagnosis not present

## 2021-02-18 DIAGNOSIS — S81802D Unspecified open wound, left lower leg, subsequent encounter: Secondary | ICD-10-CM | POA: Diagnosis not present

## 2021-02-18 DIAGNOSIS — I9589 Other hypotension: Secondary | ICD-10-CM | POA: Diagnosis not present

## 2021-02-18 DIAGNOSIS — F418 Other specified anxiety disorders: Secondary | ICD-10-CM | POA: Diagnosis not present

## 2021-02-18 DIAGNOSIS — E038 Other specified hypothyroidism: Secondary | ICD-10-CM | POA: Diagnosis not present

## 2021-02-18 DIAGNOSIS — I5189 Other ill-defined heart diseases: Secondary | ICD-10-CM | POA: Diagnosis not present

## 2021-02-19 DIAGNOSIS — M19072 Primary osteoarthritis, left ankle and foot: Secondary | ICD-10-CM | POA: Diagnosis not present

## 2021-02-19 DIAGNOSIS — I509 Heart failure, unspecified: Secondary | ICD-10-CM | POA: Diagnosis not present

## 2021-02-19 DIAGNOSIS — S81802D Unspecified open wound, left lower leg, subsequent encounter: Secondary | ICD-10-CM | POA: Diagnosis not present

## 2021-02-19 DIAGNOSIS — M85872 Other specified disorders of bone density and structure, left ankle and foot: Secondary | ICD-10-CM | POA: Diagnosis not present

## 2021-02-19 DIAGNOSIS — J449 Chronic obstructive pulmonary disease, unspecified: Secondary | ICD-10-CM | POA: Diagnosis not present

## 2021-02-19 DIAGNOSIS — M2142 Flat foot [pes planus] (acquired), left foot: Secondary | ICD-10-CM | POA: Diagnosis not present

## 2021-02-22 DIAGNOSIS — I509 Heart failure, unspecified: Secondary | ICD-10-CM | POA: Diagnosis not present

## 2021-02-22 DIAGNOSIS — M2142 Flat foot [pes planus] (acquired), left foot: Secondary | ICD-10-CM | POA: Diagnosis not present

## 2021-02-22 DIAGNOSIS — M19072 Primary osteoarthritis, left ankle and foot: Secondary | ICD-10-CM | POA: Diagnosis not present

## 2021-02-22 DIAGNOSIS — M85872 Other specified disorders of bone density and structure, left ankle and foot: Secondary | ICD-10-CM | POA: Diagnosis not present

## 2021-02-22 DIAGNOSIS — J449 Chronic obstructive pulmonary disease, unspecified: Secondary | ICD-10-CM | POA: Diagnosis not present

## 2021-02-22 DIAGNOSIS — S81802D Unspecified open wound, left lower leg, subsequent encounter: Secondary | ICD-10-CM | POA: Diagnosis not present

## 2021-02-23 DIAGNOSIS — S81802D Unspecified open wound, left lower leg, subsequent encounter: Secondary | ICD-10-CM | POA: Diagnosis not present

## 2021-02-23 DIAGNOSIS — M19072 Primary osteoarthritis, left ankle and foot: Secondary | ICD-10-CM | POA: Diagnosis not present

## 2021-02-23 DIAGNOSIS — M85872 Other specified disorders of bone density and structure, left ankle and foot: Secondary | ICD-10-CM | POA: Diagnosis not present

## 2021-02-23 DIAGNOSIS — M2142 Flat foot [pes planus] (acquired), left foot: Secondary | ICD-10-CM | POA: Diagnosis not present

## 2021-02-23 DIAGNOSIS — J449 Chronic obstructive pulmonary disease, unspecified: Secondary | ICD-10-CM | POA: Diagnosis not present

## 2021-02-23 DIAGNOSIS — I509 Heart failure, unspecified: Secondary | ICD-10-CM | POA: Diagnosis not present

## 2021-02-25 DIAGNOSIS — M85872 Other specified disorders of bone density and structure, left ankle and foot: Secondary | ICD-10-CM | POA: Diagnosis not present

## 2021-02-25 DIAGNOSIS — M19072 Primary osteoarthritis, left ankle and foot: Secondary | ICD-10-CM | POA: Diagnosis not present

## 2021-02-25 DIAGNOSIS — I5189 Other ill-defined heart diseases: Secondary | ICD-10-CM | POA: Diagnosis not present

## 2021-02-25 DIAGNOSIS — I9589 Other hypotension: Secondary | ICD-10-CM | POA: Diagnosis not present

## 2021-02-25 DIAGNOSIS — R600 Localized edema: Secondary | ICD-10-CM | POA: Diagnosis not present

## 2021-02-25 DIAGNOSIS — M2142 Flat foot [pes planus] (acquired), left foot: Secondary | ICD-10-CM | POA: Diagnosis not present

## 2021-02-25 DIAGNOSIS — S81802D Unspecified open wound, left lower leg, subsequent encounter: Secondary | ICD-10-CM | POA: Diagnosis not present

## 2021-02-25 DIAGNOSIS — J449 Chronic obstructive pulmonary disease, unspecified: Secondary | ICD-10-CM | POA: Diagnosis not present

## 2021-02-25 DIAGNOSIS — I509 Heart failure, unspecified: Secondary | ICD-10-CM | POA: Diagnosis not present

## 2021-02-25 DIAGNOSIS — S81802A Unspecified open wound, left lower leg, initial encounter: Secondary | ICD-10-CM | POA: Diagnosis not present

## 2021-02-28 DIAGNOSIS — M47814 Spondylosis without myelopathy or radiculopathy, thoracic region: Secondary | ICD-10-CM | POA: Diagnosis not present

## 2021-02-28 DIAGNOSIS — J449 Chronic obstructive pulmonary disease, unspecified: Secondary | ICD-10-CM | POA: Diagnosis not present

## 2021-02-28 DIAGNOSIS — M810 Age-related osteoporosis without current pathological fracture: Secondary | ICD-10-CM | POA: Diagnosis not present

## 2021-02-28 DIAGNOSIS — M778 Other enthesopathies, not elsewhere classified: Secondary | ICD-10-CM | POA: Diagnosis not present

## 2021-02-28 DIAGNOSIS — E039 Hypothyroidism, unspecified: Secondary | ICD-10-CM | POA: Diagnosis not present

## 2021-02-28 DIAGNOSIS — M4804 Spinal stenosis, thoracic region: Secondary | ICD-10-CM | POA: Diagnosis not present

## 2021-02-28 DIAGNOSIS — M85872 Other specified disorders of bone density and structure, left ankle and foot: Secondary | ICD-10-CM | POA: Diagnosis not present

## 2021-02-28 DIAGNOSIS — M2142 Flat foot [pes planus] (acquired), left foot: Secondary | ICD-10-CM | POA: Diagnosis not present

## 2021-02-28 DIAGNOSIS — N182 Chronic kidney disease, stage 2 (mild): Secondary | ICD-10-CM | POA: Diagnosis not present

## 2021-02-28 DIAGNOSIS — I442 Atrioventricular block, complete: Secondary | ICD-10-CM | POA: Diagnosis not present

## 2021-02-28 DIAGNOSIS — I422 Other hypertrophic cardiomyopathy: Secondary | ICD-10-CM | POA: Diagnosis not present

## 2021-02-28 DIAGNOSIS — M19072 Primary osteoarthritis, left ankle and foot: Secondary | ICD-10-CM | POA: Diagnosis not present

## 2021-02-28 DIAGNOSIS — I509 Heart failure, unspecified: Secondary | ICD-10-CM | POA: Diagnosis not present

## 2021-02-28 DIAGNOSIS — Z950 Presence of cardiac pacemaker: Secondary | ICD-10-CM | POA: Diagnosis not present

## 2021-02-28 DIAGNOSIS — M48061 Spinal stenosis, lumbar region without neurogenic claudication: Secondary | ICD-10-CM | POA: Diagnosis not present

## 2021-02-28 DIAGNOSIS — Z89611 Acquired absence of right leg above knee: Secondary | ICD-10-CM | POA: Diagnosis not present

## 2021-02-28 DIAGNOSIS — I272 Pulmonary hypertension, unspecified: Secondary | ICD-10-CM | POA: Diagnosis not present

## 2021-02-28 DIAGNOSIS — Z9181 History of falling: Secondary | ICD-10-CM | POA: Diagnosis not present

## 2021-02-28 DIAGNOSIS — I4891 Unspecified atrial fibrillation: Secondary | ICD-10-CM | POA: Diagnosis not present

## 2021-02-28 DIAGNOSIS — Z7901 Long term (current) use of anticoagulants: Secondary | ICD-10-CM | POA: Diagnosis not present

## 2021-02-28 DIAGNOSIS — D631 Anemia in chronic kidney disease: Secondary | ICD-10-CM | POA: Diagnosis not present

## 2021-02-28 DIAGNOSIS — M47816 Spondylosis without myelopathy or radiculopathy, lumbar region: Secondary | ICD-10-CM | POA: Diagnosis not present

## 2021-02-28 DIAGNOSIS — M419 Scoliosis, unspecified: Secondary | ICD-10-CM | POA: Diagnosis not present

## 2021-02-28 DIAGNOSIS — S81802D Unspecified open wound, left lower leg, subsequent encounter: Secondary | ICD-10-CM | POA: Diagnosis not present

## 2021-03-01 DIAGNOSIS — I509 Heart failure, unspecified: Secondary | ICD-10-CM | POA: Diagnosis not present

## 2021-03-01 DIAGNOSIS — S81802D Unspecified open wound, left lower leg, subsequent encounter: Secondary | ICD-10-CM | POA: Diagnosis not present

## 2021-03-01 DIAGNOSIS — M85872 Other specified disorders of bone density and structure, left ankle and foot: Secondary | ICD-10-CM | POA: Diagnosis not present

## 2021-03-01 DIAGNOSIS — M2142 Flat foot [pes planus] (acquired), left foot: Secondary | ICD-10-CM | POA: Diagnosis not present

## 2021-03-01 DIAGNOSIS — J449 Chronic obstructive pulmonary disease, unspecified: Secondary | ICD-10-CM | POA: Diagnosis not present

## 2021-03-01 DIAGNOSIS — M19072 Primary osteoarthritis, left ankle and foot: Secondary | ICD-10-CM | POA: Diagnosis not present

## 2021-03-02 DIAGNOSIS — M19072 Primary osteoarthritis, left ankle and foot: Secondary | ICD-10-CM | POA: Diagnosis not present

## 2021-03-02 DIAGNOSIS — M85872 Other specified disorders of bone density and structure, left ankle and foot: Secondary | ICD-10-CM | POA: Diagnosis not present

## 2021-03-02 DIAGNOSIS — J449 Chronic obstructive pulmonary disease, unspecified: Secondary | ICD-10-CM | POA: Diagnosis not present

## 2021-03-02 DIAGNOSIS — S81802D Unspecified open wound, left lower leg, subsequent encounter: Secondary | ICD-10-CM | POA: Diagnosis not present

## 2021-03-02 DIAGNOSIS — I509 Heart failure, unspecified: Secondary | ICD-10-CM | POA: Diagnosis not present

## 2021-03-02 DIAGNOSIS — M2142 Flat foot [pes planus] (acquired), left foot: Secondary | ICD-10-CM | POA: Diagnosis not present

## 2021-03-03 DIAGNOSIS — J449 Chronic obstructive pulmonary disease, unspecified: Secondary | ICD-10-CM | POA: Diagnosis not present

## 2021-03-03 DIAGNOSIS — M2142 Flat foot [pes planus] (acquired), left foot: Secondary | ICD-10-CM | POA: Diagnosis not present

## 2021-03-03 DIAGNOSIS — M85872 Other specified disorders of bone density and structure, left ankle and foot: Secondary | ICD-10-CM | POA: Diagnosis not present

## 2021-03-03 DIAGNOSIS — S81802D Unspecified open wound, left lower leg, subsequent encounter: Secondary | ICD-10-CM | POA: Diagnosis not present

## 2021-03-03 DIAGNOSIS — M19072 Primary osteoarthritis, left ankle and foot: Secondary | ICD-10-CM | POA: Diagnosis not present

## 2021-03-03 DIAGNOSIS — I509 Heart failure, unspecified: Secondary | ICD-10-CM | POA: Diagnosis not present

## 2021-03-04 DIAGNOSIS — M85872 Other specified disorders of bone density and structure, left ankle and foot: Secondary | ICD-10-CM | POA: Diagnosis not present

## 2021-03-04 DIAGNOSIS — M2142 Flat foot [pes planus] (acquired), left foot: Secondary | ICD-10-CM | POA: Diagnosis not present

## 2021-03-04 DIAGNOSIS — S81802D Unspecified open wound, left lower leg, subsequent encounter: Secondary | ICD-10-CM | POA: Diagnosis not present

## 2021-03-04 DIAGNOSIS — I509 Heart failure, unspecified: Secondary | ICD-10-CM | POA: Diagnosis not present

## 2021-03-04 DIAGNOSIS — J449 Chronic obstructive pulmonary disease, unspecified: Secondary | ICD-10-CM | POA: Diagnosis not present

## 2021-03-04 DIAGNOSIS — M19072 Primary osteoarthritis, left ankle and foot: Secondary | ICD-10-CM | POA: Diagnosis not present

## 2021-03-08 DIAGNOSIS — J449 Chronic obstructive pulmonary disease, unspecified: Secondary | ICD-10-CM | POA: Diagnosis not present

## 2021-03-08 DIAGNOSIS — M2142 Flat foot [pes planus] (acquired), left foot: Secondary | ICD-10-CM | POA: Diagnosis not present

## 2021-03-08 DIAGNOSIS — M85872 Other specified disorders of bone density and structure, left ankle and foot: Secondary | ICD-10-CM | POA: Diagnosis not present

## 2021-03-08 DIAGNOSIS — M19072 Primary osteoarthritis, left ankle and foot: Secondary | ICD-10-CM | POA: Diagnosis not present

## 2021-03-08 DIAGNOSIS — S81802D Unspecified open wound, left lower leg, subsequent encounter: Secondary | ICD-10-CM | POA: Diagnosis not present

## 2021-03-08 DIAGNOSIS — I509 Heart failure, unspecified: Secondary | ICD-10-CM | POA: Diagnosis not present

## 2021-03-10 DIAGNOSIS — S81802D Unspecified open wound, left lower leg, subsequent encounter: Secondary | ICD-10-CM | POA: Diagnosis not present

## 2021-03-10 DIAGNOSIS — M2142 Flat foot [pes planus] (acquired), left foot: Secondary | ICD-10-CM | POA: Diagnosis not present

## 2021-03-10 DIAGNOSIS — I509 Heart failure, unspecified: Secondary | ICD-10-CM | POA: Diagnosis not present

## 2021-03-10 DIAGNOSIS — M85872 Other specified disorders of bone density and structure, left ankle and foot: Secondary | ICD-10-CM | POA: Diagnosis not present

## 2021-03-10 DIAGNOSIS — J449 Chronic obstructive pulmonary disease, unspecified: Secondary | ICD-10-CM | POA: Diagnosis not present

## 2021-03-10 DIAGNOSIS — M19072 Primary osteoarthritis, left ankle and foot: Secondary | ICD-10-CM | POA: Diagnosis not present

## 2021-03-12 DIAGNOSIS — M85872 Other specified disorders of bone density and structure, left ankle and foot: Secondary | ICD-10-CM | POA: Diagnosis not present

## 2021-03-12 DIAGNOSIS — I509 Heart failure, unspecified: Secondary | ICD-10-CM | POA: Diagnosis not present

## 2021-03-12 DIAGNOSIS — M19072 Primary osteoarthritis, left ankle and foot: Secondary | ICD-10-CM | POA: Diagnosis not present

## 2021-03-12 DIAGNOSIS — M2142 Flat foot [pes planus] (acquired), left foot: Secondary | ICD-10-CM | POA: Diagnosis not present

## 2021-03-12 DIAGNOSIS — S81802D Unspecified open wound, left lower leg, subsequent encounter: Secondary | ICD-10-CM | POA: Diagnosis not present

## 2021-03-12 DIAGNOSIS — J449 Chronic obstructive pulmonary disease, unspecified: Secondary | ICD-10-CM | POA: Diagnosis not present

## 2021-03-15 DIAGNOSIS — J449 Chronic obstructive pulmonary disease, unspecified: Secondary | ICD-10-CM | POA: Diagnosis not present

## 2021-03-15 DIAGNOSIS — M19072 Primary osteoarthritis, left ankle and foot: Secondary | ICD-10-CM | POA: Diagnosis not present

## 2021-03-15 DIAGNOSIS — I509 Heart failure, unspecified: Secondary | ICD-10-CM | POA: Diagnosis not present

## 2021-03-15 DIAGNOSIS — M2142 Flat foot [pes planus] (acquired), left foot: Secondary | ICD-10-CM | POA: Diagnosis not present

## 2021-03-15 DIAGNOSIS — M85872 Other specified disorders of bone density and structure, left ankle and foot: Secondary | ICD-10-CM | POA: Diagnosis not present

## 2021-03-15 DIAGNOSIS — S81802D Unspecified open wound, left lower leg, subsequent encounter: Secondary | ICD-10-CM | POA: Diagnosis not present

## 2021-03-17 ENCOUNTER — Non-Acute Institutional Stay: Payer: Medicare Other

## 2021-03-17 ENCOUNTER — Other Ambulatory Visit: Payer: Self-pay

## 2021-03-17 DIAGNOSIS — Z515 Encounter for palliative care: Secondary | ICD-10-CM

## 2021-03-19 ENCOUNTER — Inpatient Hospital Stay (HOSPITAL_COMMUNITY)
Admission: EM | Admit: 2021-03-19 | Discharge: 2021-03-24 | DRG: 291 | Disposition: A | Discharge status: Skilled Nursing Facility | Payer: Medicare Other | Source: Skilled Nursing Facility | Attending: Internal Medicine | Admitting: Internal Medicine

## 2021-03-19 ENCOUNTER — Encounter (HOSPITAL_COMMUNITY): Payer: Self-pay | Admitting: *Deleted

## 2021-03-19 ENCOUNTER — Emergency Department (HOSPITAL_COMMUNITY): Payer: Medicare Other

## 2021-03-19 DIAGNOSIS — J309 Allergic rhinitis, unspecified: Secondary | ICD-10-CM | POA: Diagnosis not present

## 2021-03-19 DIAGNOSIS — R7989 Other specified abnormal findings of blood chemistry: Secondary | ICD-10-CM | POA: Diagnosis not present

## 2021-03-19 DIAGNOSIS — I421 Obstructive hypertrophic cardiomyopathy: Secondary | ICD-10-CM | POA: Diagnosis not present

## 2021-03-19 DIAGNOSIS — M792 Neuralgia and neuritis, unspecified: Secondary | ICD-10-CM | POA: Diagnosis not present

## 2021-03-19 DIAGNOSIS — M1990 Unspecified osteoarthritis, unspecified site: Secondary | ICD-10-CM | POA: Diagnosis present

## 2021-03-19 DIAGNOSIS — Z882 Allergy status to sulfonamides status: Secondary | ICD-10-CM | POA: Diagnosis not present

## 2021-03-19 DIAGNOSIS — Z89511 Acquired absence of right leg below knee: Secondary | ICD-10-CM | POA: Diagnosis not present

## 2021-03-19 DIAGNOSIS — Z681 Body mass index (BMI) 19 or less, adult: Secondary | ICD-10-CM

## 2021-03-19 DIAGNOSIS — Z89611 Acquired absence of right leg above knee: Secondary | ICD-10-CM

## 2021-03-19 DIAGNOSIS — Z8249 Family history of ischemic heart disease and other diseases of the circulatory system: Secondary | ICD-10-CM

## 2021-03-19 DIAGNOSIS — I509 Heart failure, unspecified: Secondary | ICD-10-CM

## 2021-03-19 DIAGNOSIS — Z20822 Contact with and (suspected) exposure to covid-19: Secondary | ICD-10-CM | POA: Diagnosis present

## 2021-03-19 DIAGNOSIS — R4182 Altered mental status, unspecified: Secondary | ICD-10-CM | POA: Diagnosis not present

## 2021-03-19 DIAGNOSIS — I517 Cardiomegaly: Secondary | ICD-10-CM | POA: Diagnosis not present

## 2021-03-19 DIAGNOSIS — F0390 Unspecified dementia without behavioral disturbance: Secondary | ICD-10-CM | POA: Diagnosis present

## 2021-03-19 DIAGNOSIS — Z7901 Long term (current) use of anticoagulants: Secondary | ICD-10-CM | POA: Diagnosis not present

## 2021-03-19 DIAGNOSIS — J449 Chronic obstructive pulmonary disease, unspecified: Secondary | ICD-10-CM | POA: Diagnosis present

## 2021-03-19 DIAGNOSIS — R0602 Shortness of breath: Secondary | ICD-10-CM | POA: Diagnosis not present

## 2021-03-19 DIAGNOSIS — N1831 Chronic kidney disease, stage 3a: Secondary | ICD-10-CM | POA: Diagnosis not present

## 2021-03-19 DIAGNOSIS — G309 Alzheimer's disease, unspecified: Secondary | ICD-10-CM | POA: Diagnosis not present

## 2021-03-19 DIAGNOSIS — F0280 Dementia in other diseases classified elsewhere without behavioral disturbance: Secondary | ICD-10-CM | POA: Diagnosis not present

## 2021-03-19 DIAGNOSIS — Z950 Presence of cardiac pacemaker: Secondary | ICD-10-CM | POA: Diagnosis not present

## 2021-03-19 DIAGNOSIS — R0902 Hypoxemia: Secondary | ICD-10-CM | POA: Diagnosis not present

## 2021-03-19 DIAGNOSIS — Z981 Arthrodesis status: Secondary | ICD-10-CM | POA: Diagnosis not present

## 2021-03-19 DIAGNOSIS — I469 Cardiac arrest, cause unspecified: Secondary | ICD-10-CM | POA: Diagnosis not present

## 2021-03-19 DIAGNOSIS — M810 Age-related osteoporosis without current pathological fracture: Secondary | ICD-10-CM | POA: Diagnosis not present

## 2021-03-19 DIAGNOSIS — R54 Age-related physical debility: Secondary | ICD-10-CM | POA: Diagnosis not present

## 2021-03-19 DIAGNOSIS — I482 Chronic atrial fibrillation, unspecified: Secondary | ICD-10-CM | POA: Diagnosis present

## 2021-03-19 DIAGNOSIS — Z885 Allergy status to narcotic agent status: Secondary | ICD-10-CM

## 2021-03-19 DIAGNOSIS — M2550 Pain in unspecified joint: Secondary | ICD-10-CM | POA: Diagnosis not present

## 2021-03-19 DIAGNOSIS — M2142 Flat foot [pes planus] (acquired), left foot: Secondary | ICD-10-CM | POA: Diagnosis not present

## 2021-03-19 DIAGNOSIS — Z79899 Other long term (current) drug therapy: Secondary | ICD-10-CM | POA: Diagnosis not present

## 2021-03-19 DIAGNOSIS — J9601 Acute respiratory failure with hypoxia: Secondary | ICD-10-CM | POA: Diagnosis present

## 2021-03-19 DIAGNOSIS — R069 Unspecified abnormalities of breathing: Secondary | ICD-10-CM | POA: Diagnosis not present

## 2021-03-19 DIAGNOSIS — I442 Atrioventricular block, complete: Secondary | ICD-10-CM | POA: Diagnosis present

## 2021-03-19 DIAGNOSIS — J45909 Unspecified asthma, uncomplicated: Secondary | ICD-10-CM | POA: Diagnosis not present

## 2021-03-19 DIAGNOSIS — I5042 Chronic combined systolic (congestive) and diastolic (congestive) heart failure: Secondary | ICD-10-CM | POA: Diagnosis present

## 2021-03-19 DIAGNOSIS — R278 Other lack of coordination: Secondary | ICD-10-CM | POA: Diagnosis not present

## 2021-03-19 DIAGNOSIS — T502X5A Adverse effect of carbonic-anhydrase inhibitors, benzothiadiazides and other diuretics, initial encounter: Secondary | ICD-10-CM | POA: Diagnosis not present

## 2021-03-19 DIAGNOSIS — I5031 Acute diastolic (congestive) heart failure: Secondary | ICD-10-CM | POA: Diagnosis not present

## 2021-03-19 DIAGNOSIS — R636 Underweight: Secondary | ICD-10-CM | POA: Diagnosis present

## 2021-03-19 DIAGNOSIS — E46 Unspecified protein-calorie malnutrition: Secondary | ICD-10-CM | POA: Diagnosis not present

## 2021-03-19 DIAGNOSIS — Z7401 Bed confinement status: Secondary | ICD-10-CM | POA: Diagnosis not present

## 2021-03-19 DIAGNOSIS — I2510 Atherosclerotic heart disease of native coronary artery without angina pectoris: Secondary | ICD-10-CM | POA: Diagnosis present

## 2021-03-19 DIAGNOSIS — R011 Cardiac murmur, unspecified: Secondary | ICD-10-CM | POA: Diagnosis not present

## 2021-03-19 DIAGNOSIS — S81802D Unspecified open wound, left lower leg, subsequent encounter: Secondary | ICD-10-CM | POA: Diagnosis not present

## 2021-03-19 DIAGNOSIS — H9193 Unspecified hearing loss, bilateral: Secondary | ICD-10-CM | POA: Diagnosis not present

## 2021-03-19 DIAGNOSIS — I5033 Acute on chronic diastolic (congestive) heart failure: Secondary | ICD-10-CM

## 2021-03-19 DIAGNOSIS — I110 Hypertensive heart disease with heart failure: Secondary | ICD-10-CM | POA: Diagnosis present

## 2021-03-19 DIAGNOSIS — I422 Other hypertrophic cardiomyopathy: Secondary | ICD-10-CM | POA: Diagnosis present

## 2021-03-19 DIAGNOSIS — Z888 Allergy status to other drugs, medicaments and biological substances status: Secondary | ICD-10-CM

## 2021-03-19 DIAGNOSIS — K5900 Constipation, unspecified: Secondary | ICD-10-CM | POA: Diagnosis not present

## 2021-03-19 DIAGNOSIS — J90 Pleural effusion, not elsewhere classified: Secondary | ICD-10-CM | POA: Diagnosis not present

## 2021-03-19 DIAGNOSIS — Z90710 Acquired absence of both cervix and uterus: Secondary | ICD-10-CM | POA: Diagnosis not present

## 2021-03-19 DIAGNOSIS — I10 Essential (primary) hypertension: Secondary | ICD-10-CM | POA: Diagnosis not present

## 2021-03-19 DIAGNOSIS — E039 Hypothyroidism, unspecified: Secondary | ICD-10-CM | POA: Diagnosis present

## 2021-03-19 DIAGNOSIS — M6281 Muscle weakness (generalized): Secondary | ICD-10-CM | POA: Diagnosis not present

## 2021-03-19 DIAGNOSIS — M85872 Other specified disorders of bone density and structure, left ankle and foot: Secondary | ICD-10-CM | POA: Diagnosis not present

## 2021-03-19 DIAGNOSIS — M19072 Primary osteoarthritis, left ankle and foot: Secondary | ICD-10-CM | POA: Diagnosis not present

## 2021-03-19 DIAGNOSIS — I5023 Acute on chronic systolic (congestive) heart failure: Secondary | ICD-10-CM | POA: Diagnosis not present

## 2021-03-19 HISTORY — DX: Atherosclerotic heart disease of native coronary artery without angina pectoris: I25.10

## 2021-03-19 HISTORY — DX: Chronic obstructive pulmonary disease, unspecified: J44.9

## 2021-03-19 HISTORY — DX: Disorder of kidney and ureter, unspecified: N28.9

## 2021-03-19 HISTORY — DX: Disorder of thyroid, unspecified: E07.9

## 2021-03-19 HISTORY — DX: Unspecified dementia, unspecified severity, without behavioral disturbance, psychotic disturbance, mood disturbance, and anxiety: F03.90

## 2021-03-19 LAB — CBC WITH DIFFERENTIAL/PLATELET
Abs Immature Granulocytes: 0.05 10*3/uL (ref 0.00–0.07)
Basophils Absolute: 0.1 10*3/uL (ref 0.0–0.1)
Basophils Relative: 1 %
Eosinophils Absolute: 0.1 10*3/uL (ref 0.0–0.5)
Eosinophils Relative: 1 %
HCT: 39.1 % (ref 36.0–46.0)
Hemoglobin: 12.1 g/dL (ref 12.0–15.0)
Immature Granulocytes: 1 %
Lymphocytes Relative: 14 %
Lymphs Abs: 1.2 10*3/uL (ref 0.7–4.0)
MCH: 31.1 pg (ref 26.0–34.0)
MCHC: 30.9 g/dL (ref 30.0–36.0)
MCV: 100.5 fL — ABNORMAL HIGH (ref 80.0–100.0)
Monocytes Absolute: 1.2 10*3/uL — ABNORMAL HIGH (ref 0.1–1.0)
Monocytes Relative: 14 %
Neutro Abs: 6.3 10*3/uL (ref 1.7–7.7)
Neutrophils Relative %: 69 %
Platelets: 256 10*3/uL (ref 150–400)
RBC: 3.89 MIL/uL (ref 3.87–5.11)
RDW: 18.9 % — ABNORMAL HIGH (ref 11.5–15.5)
WBC: 8.9 10*3/uL (ref 4.0–10.5)
nRBC: 0 % (ref 0.0–0.2)

## 2021-03-19 LAB — BASIC METABOLIC PANEL
Anion gap: 12 (ref 5–15)
BUN: 26 mg/dL — ABNORMAL HIGH (ref 8–23)
CO2: 22 mmol/L (ref 22–32)
Calcium: 9.1 mg/dL (ref 8.9–10.3)
Chloride: 104 mmol/L (ref 98–111)
Creatinine, Ser: 1.15 mg/dL — ABNORMAL HIGH (ref 0.44–1.00)
GFR, Estimated: 46 mL/min — ABNORMAL LOW (ref >60)
Glucose, Bld: 87 mg/dL (ref 70–99)
Potassium: 4.2 mmol/L (ref 3.5–5.1)
Sodium: 138 mmol/L (ref 135–145)

## 2021-03-19 LAB — BRAIN NATRIURETIC PEPTIDE: B Natriuretic Peptide: 1444.5 pg/mL — ABNORMAL HIGH (ref 0.0–100.0)

## 2021-03-19 LAB — RESP PANEL BY RT-PCR (FLU A&B, COVID) ARPGX2
Influenza A by PCR: NEGATIVE
Influenza B by PCR: NEGATIVE
SARS Coronavirus 2 by RT PCR: NEGATIVE

## 2021-03-19 LAB — D-DIMER, QUANTITATIVE: D-Dimer, Quant: 0.73 ug/mL-FEU — ABNORMAL HIGH (ref 0.00–0.50)

## 2021-03-19 MED ORDER — ACETAMINOPHEN 325 MG PO TABS
650.0000 mg | ORAL_TABLET | ORAL | Status: DC | PRN
  Administered 2021-03-20 – 2021-03-24 (×4): 650 mg via ORAL
  Filled 2021-03-19 (×4): qty 2

## 2021-03-19 MED ORDER — FUROSEMIDE 10 MG/ML IJ SOLN
20.0000 mg | Freq: Two times a day (BID) | INTRAMUSCULAR | Status: DC
  Administered 2021-03-20 – 2021-03-21 (×2): 20 mg via INTRAVENOUS
  Filled 2021-03-19 (×2): qty 2

## 2021-03-19 MED ORDER — FUROSEMIDE 10 MG/ML IJ SOLN
60.0000 mg | Freq: Once | INTRAMUSCULAR | Status: AC
  Administered 2021-03-19: 60 mg via INTRAVENOUS
  Filled 2021-03-19: qty 6

## 2021-03-19 MED ORDER — LEVOTHYROXINE SODIUM 25 MCG PO TABS
25.0000 ug | ORAL_TABLET | Freq: Every day | ORAL | Status: DC
  Administered 2021-03-20 – 2021-03-24 (×5): 25 ug via ORAL
  Filled 2021-03-19 (×5): qty 1

## 2021-03-19 MED ORDER — IOHEXOL 350 MG/ML SOLN
75.0000 mL | Freq: Once | INTRAVENOUS | Status: AC | PRN
  Administered 2021-03-19: 75 mL via INTRAVENOUS

## 2021-03-19 MED ORDER — HYDROXYZINE HCL 25 MG PO TABS
12.5000 mg | ORAL_TABLET | Freq: Three times a day (TID) | ORAL | Status: DC | PRN
  Administered 2021-03-20 – 2021-03-23 (×5): 12.5 mg via ORAL
  Filled 2021-03-19 (×5): qty 1

## 2021-03-19 MED ORDER — ONDANSETRON HCL 4 MG/2ML IJ SOLN: 4.0000 mg | Freq: Four times a day (QID) | INTRAMUSCULAR | Status: DC | PRN

## 2021-03-19 MED ORDER — APIXABAN 2.5 MG PO TABS
2.5000 mg | ORAL_TABLET | Freq: Two times a day (BID) | ORAL | Status: DC
  Administered 2021-03-20 – 2021-03-24 (×10): 2.5 mg via ORAL
  Filled 2021-03-19 (×10): qty 1

## 2021-03-19 MED ORDER — DOCUSATE SODIUM 50 MG PO CAPS
50.0000 mg | ORAL_CAPSULE | Freq: Every day | ORAL | Status: DC
Start: 2021-03-20 — End: 2021-03-24
  Administered 2021-03-20 – 2021-03-24 (×5): 50 mg via ORAL
  Filled 2021-03-19 (×5): qty 1

## 2021-03-19 MED ORDER — SODIUM CHLORIDE 0.9 % IV SOLN: 250.0000 mL | INTRAVENOUS | Status: DC | PRN

## 2021-03-19 MED ORDER — POLYETHYLENE GLYCOL 3350 17 G PO PACK: 17.0000 g | PACK | Freq: Every day | ORAL | Status: DC | PRN

## 2021-03-19 MED ORDER — GABAPENTIN 100 MG PO CAPS
200.0000 mg | ORAL_CAPSULE | Freq: Two times a day (BID) | ORAL | Status: DC
  Administered 2021-03-20 – 2021-03-24 (×10): 200 mg via ORAL
  Filled 2021-03-19 (×10): qty 2

## 2021-03-19 NOTE — ED Triage Notes (Signed)
PT here via [facility] from [facility] in [place] with sob for several days that does not improve with breathing tx.  Received breathing tx before 911 was called.    Initial sats of 92% that increased to 98% w/2L [place].  Pt ao x 4.

## 2021-03-19 NOTE — ED Provider Notes (Addendum)
[facility] EMERGENCY DEPARTMENT Provider Note   CSN: [id number] Arrival date & time: 03/19/21  1541     History Chief Complaint  Patient presents with  . Shortness of Breath    [name] is a 87 y.o. female.  Patient is a 87 year old female who presents with shortness of breath.  She has a history of pacemaker placement, atrial fibrillation on Eliquis, cardiomyopathy, CHF, COPD, chronic kidney disease and reported baseline confusion.  She reports by EMS from [facility] assisted living facility.  She reports she has had some shortness of breath for the last couple of days but got worse today.  She reportedly is not on home oxygen.  She has been receiving some breathing treatments without improvement in symptoms.  She had initial saturations of 92% on EMS arrival that improved and 98% with a nasal cannula at 2 L/min.  She denies any chest pain.  She does not report any recent cough or cold symptoms.  She has some chronic swelling of her left leg which is unchanged per her report.  She has been treated for a wound infection in her left leg.  She is status post right AKA.        Past Medical History:  Diagnosis Date  . Acute on chronic diastolic CHF (congestive heart failure) ([place]) 05/18/2015  . Acute respiratory failure ([place]) 06/09/2016  . Anemia   . Arthritis   . Arthritis pain   . Asthma   . Blood transfusion   . Bronchitis   . Cancer ([place])    lt breast  . CHF (congestive heart failure) ([place])   . Constipation   . COPD (chronic obstructive pulmonary disease) ([place])   . Coronary artery disease   . Current use of long term anticoagulation   . Cystitis   . Dementia ([place])   . Encounter for care of pacemaker 06/13/2019  . Heart block    Complete heart block post surgical requiring pacer  . Humerus shaft fracture    nonsurgical, June 2017  . Hypertr obst cardiomyop    Required septal myotomy  . Hypokalemia    Postoperative, improved  . Hyponatremia    Mild  postoperative, improved  . Memory difficulty   . Mitral regurgitation   . Pacemaker   . Renal disorder    Stage III  . Thyroid disease    hypothyroidism  . Urinary incontinence     Patient Active Problem List   Diagnosis Date Noted  . Acute on chronic diastolic CHF (congestive heart failure) ([place]) 03/19/2021  . Encephalopathy 10/02/2019  . Elevated troponin 10/02/2019  . AMS (altered mental status) 10/02/2019  . Heart block   . Encounter for care of pacemaker 06/13/2019  . Right carotid bruit 02/18/2019  . CKD (chronic kidney disease) 01/29/2019  . Hypoglycemia 01/29/2019  . CHF (congestive heart failure) ([place]) 01/28/2019  . Rt Olecranon fracture 04/07/2018  . Essential hypertension 04/07/2018  . Rt Hip Fracture ([place]) 04/06/2018  . Obstructive lung disease (generalized) ([place]) 10/04/2017  . Humerus fracture 06/09/2016  . Hx of right BKA ([place]) 06/09/2016  . Malnutrition of moderate degree 06/09/2016  . Unilateral AKA ([place]) 09/23/2014  . Chronic atrial fibrillation ([place]) 08/12/2014  . Hypertension 01/17/2014  . Cardiac pacemaker in situ 01/17/2014  . Heart block AV complete ([place]) 07/03/2013  . Atrial fibrillation ([place]) 07/03/2013  . Breast cancer, left breast ([place]) 04/24/2013  . Other symptoms involving cardiovascular system 12/22/2010  . MITRAL REGURGITATION 06/17/2010  . HOCM (  hypertrophic obstructive cardiomyopathy) ([place]) 06/17/2010  . History of heart block 06/17/2010  . HEMORRHOIDS 06/17/2010  . BRONCHITIS 06/17/2010  . ASTHMA 06/17/2010  . CYSTITIS 06/17/2010  . URINARY INCONTINENCE 06/17/2010  . Pacemaker Medtronic Adapta dual-chamber pacemaker 2006, generator change 11/27/2013, programmed to VVI  06/21/2005    Past Surgical History:  Procedure Laterality Date  . ABDOMINAL HYSTERECTOMY    . ABOVE KNEE LEG AMPUTATION Right   . Anterior cervical diskectomy and fusion     C-7  . BELOW KNEE LEG AMPUTATION Right   . BREAST BIOPSY     Left, negative  . BREAST  LUMPECTOMY  2012   left breast  . cardiomyopathy    . COLONOSCOPY    . FEMUR FRACTURE SURGERY     Right  . FRACTURE SURGERY    . JOINT REPLACEMENT    . ORIF ELBOW FRACTURE Right 04/08/2018   Procedure: OPEN REDUCTION INTERNAL FIXATION (ORIF) ELBOW/OLECRANON FRACTURE;  Surgeon: Renette Butters, MD;  Location: [facility];  Service: Orthopedics;  Laterality: Right;  . PACEMAKER PLACEMENT  2006/11-27-2013   MDT dual chamber pacemaker implanted 2006 with gen change by Dr Lovena Le 11-27-2013  . PERMANENT PACEMAKER GENERATOR CHANGE N/A 11/27/2013   Procedure: PERMANENT PACEMAKER GENERATOR CHANGE;  Surgeon: Evans Lance, MD;  Location: [facility] CATH LAB;  Service: Cardiovascular;  Laterality: N/A;  . REPLACEMENT TOTAL KNEE BILATERAL    . Septal myotomy    . SHOULDER SURGERY     Right  . WRIST FRACTURE SURGERY     Right     OB History   No obstetric history on file.     Family History  Problem Relation Age of Onset  . Heart disease Mother   . Hypertension Mother   . Pancreatic cancer Mother   . Cancer Mother        pancreatic  . Pancreatic cancer Father   . Cancer Father        pancreatic  . Diabetes Brother   . Pancreatic cancer Other   . Heart disease Brother        Enlarged heart    Social History   Tobacco Use  . Smoking status: Never Smoker  . Smokeless tobacco: Never Used  Vaping Use  . Vaping Use: Never used  Substance Use Topics  . Alcohol use: Not Currently  . Drug use: Never    Home Medications Prior to Admission medications   Medication Sig Start Date End Date Taking? Authorizing Provider  acetaminophen (TYLENOL) 325 MG tablet Take 650 mg by mouth 2 (two) times daily as needed for mild pain or fever.   Yes [provider]  acetaminophen (TYLENOL) 500 MG tablet Take 1,000 mg by mouth 3 (three) times daily.   Yes [provider]  apixaban (ELIQUIS) 2.5 MG TABS tablet Take 1 tablet (2.5 mg total) by mouth 2 (two) times daily. 10/01/14  Yes Angiulli,  Lavon Paganini, PA-C  docusate sodium (COLACE) 50 MG capsule Take 50 mg by mouth daily.   Yes [provider]  furosemide (LASIX) 20 MG tablet Take 1 tablet (20 mg total) by mouth daily. Patient taking differently: Take 10 mg by mouth daily. 02/18/19  Yes Adrian Prows, MD  gabapentin (NEURONTIN) 100 MG capsule Take 200 mg by mouth 2 (two) times daily. 12/27/18  Yes [provider]  hydrOXYzine (ATARAX/VISTARIL) 25 MG tablet Take 12.5 mg by mouth 3 (three) times daily.   Yes [provider]  levothyroxine (SYNTHROID) 25 MCG tablet  Take 25 mcg by mouth daily before breakfast.   Yes [provider]  Nutritional Supplements (ENSURE MAX PROTEIN PO) Take 1 Can by mouth in the morning and at bedtime.   Yes [provider]  polyethylene glycol powder (GLYCOLAX/MIRALAX) 17 GM/SCOOP powder Take 17 g by mouth daily as needed for mild constipation.   Yes [provider]    Allergies    Codeine, Morphine and related, Sulfa antibiotics, and Ceftaroline  Review of Systems   Review of Systems  Constitutional: Negative for chills, diaphoresis, fatigue and fever.  HENT: Negative for congestion, rhinorrhea and sneezing.   Eyes: Negative.   Respiratory: Positive for shortness of breath. Negative for cough and chest tightness.   Cardiovascular: Negative for chest pain and leg swelling.  Gastrointestinal: Negative for abdominal pain, blood in stool, diarrhea, nausea and vomiting.  Genitourinary: Negative for difficulty urinating, flank pain, frequency and hematuria.  Musculoskeletal: Negative for arthralgias and back pain.  Skin: Negative for rash.  Neurological: Negative for dizziness, speech difficulty, weakness, numbness and headaches.    Physical Exam Updated Vital Signs BP (!) 141/74   Pulse 64   Temp 97.6 F (36.4 C) (Oral)   Resp (!) 23   SpO2 96%   Physical Exam Constitutional:      Appearance: She is well-developed.  HENT:     Head: Normocephalic  and atraumatic.  Eyes:     Pupils: Pupils are equal, round, and reactive to light.  Cardiovascular:     Rate and Rhythm: Normal rate and regular rhythm.     Heart sounds: Murmur heard.    Pulmonary:     Effort: Pulmonary effort is normal. No respiratory distress.     Breath sounds: Rales present. No wheezing.  Chest:     Chest wall: No tenderness.  Abdominal:     General: Bowel sounds are normal.     Palpations: Abdomen is soft.     Tenderness: There is no abdominal tenderness. There is no guarding or rebound.  Musculoskeletal:        General: Normal range of motion.     Cervical back: Normal range of motion and neck supple.     Comments: 1+ edema to the left lower extremity, she status post right AKA.  There is a healing wound to the calf area on the left lower leg.  There is some mild erythema but no drainage or other signs of infection.  Lymphadenopathy:     Cervical: No cervical adenopathy.  Skin:    General: Skin is warm and dry.     Findings: No rash.  Neurological:     General: No focal deficit present.     Mental Status: She is alert.     Comments: Oriented to person and place only     ED Results / Procedures / Treatments   Labs (all labs ordered are listed, but only abnormal results are displayed) Labs Reviewed  BASIC METABOLIC PANEL - Abnormal; Notable for the following components:      Result Value   BUN 26 (*)    Creatinine, Ser 1.15 (*)    GFR, Estimated 46 (*)    All other components within normal limits  CBC WITH DIFFERENTIAL/PLATELET - Abnormal; Notable for the following components:   MCV 100.5 (*)    RDW 18.9 (*)    Monocytes Absolute 1.2 (*)    All other components within normal limits  BRAIN NATRIURETIC PEPTIDE - Abnormal; Notable for the following components:  B Natriuretic Peptide 1,444.5 (*)    All other components within normal limits  D-DIMER, QUANTITATIVE - Abnormal; Notable for the following components:   D-Dimer, Quant 0.73 (*)    All  other components within normal limits  RESP PANEL BY RT-PCR (FLU A&B, COVID) [id number]    EKG EKG Interpretation  Date/Time:  Friday March 19 2021 20:03:34 EDT Ventricular Rate:  85 PR Interval:  153 QRS Duration: 187 QT Interval:  483 QTC Calculation: 575 R Axis:   -60 Text Interpretation: Ventricular-paced rhythm Confirmed by Malvin Johns [phone]) on 03/19/2021 8:06:05 PM   Radiology DG Chest 2 View  Result Date: 03/19/2021 CLINICAL DATA:  Shortness of breath EXAM: CHEST - 2 VIEW COMPARISON:  10/02/2019 FINDINGS: Surgical hardware in the right shoulder and cervical spine. Rotated patient. Post sternotomy changes. Enlarged cardiomediastinal silhouette with aortic atherosclerosis. No focal opacity, pleural effusion, or pneumothorax. Kyphosis of the spine. Right-sided pacing device, grossly stable allowing for rotation. IMPRESSION: No active cardiopulmonary disease. Cardiomegaly. Electronically Signed   By: Donavan Foil M.D.   On: 03/19/2021 16:47   CT Angio Chest PE W/Cm &/Or Wo Cm  Result Date: 03/19/2021 CLINICAL DATA:  Positive D-dimer EXAM: CT ANGIOGRAPHY CHEST WITH CONTRAST TECHNIQUE: Multidetector CT imaging of the chest was performed using the standard protocol during bolus administration of intravenous contrast. Multiplanar CT image reconstructions and MIPs were obtained to evaluate the vascular anatomy. CONTRAST:  69mL OMNIPAQUE IOHEXOL 350 MG/ML SOLN COMPARISON:  None. FINDINGS: Cardiovascular: No filling defects in the pulmonary arteries to suggest pulmonary emboli. Cardiomegaly. Densely calcified mitral valve, aortic valve and aorta. Prior CABG. No aneurysm. Mediastinum/Nodes: No mediastinal, hilar, or axillary adenopathy. Trachea and esophagus are unremarkable. Thyroid unremarkable. Lungs/Pleura: Small bilateral pleural effusions. Scattered ground-glass opacities throughout the lungs could reflect early edema. Upper Abdomen: Imaging into the upper abdomen demonstrates no acute  findings. Musculoskeletal: Chest wall soft tissues are unremarkable. No acute bony abnormality. Review of the MIP images confirms the above findings. IMPRESSION: No evidence of pulmonary embolus. Cardiomegaly. Scattered ground-glass opacities in the lungs could reflect early edema. Small bilateral effusions. Aortic Atherosclerosis (ICD10-I70.0). Electronically Signed   By: Rolm Baptise M.D.   On: 03/19/2021 22:21    Procedures Procedures   Medications Ordered in ED Medications  furosemide (LASIX) injection 60 mg (60 mg Intravenous Given 03/19/21 1932)  iohexol (OMNIPAQUE) 350 MG/ML injection 75 mL (75 mLs Intravenous Contrast Given 03/19/21 2215)    ED Course  I have reviewed the triage vital signs and the nursing notes.  Pertinent labs & imaging results that were available during my care of the patient were reviewed by me and considered in my medical decision making (see chart for details).    MDM Rules/Calculators/A&P                          Patient is a 87 year old female who presents with shortness of breath.  No associated chest pain.  Chest x-ray is clear without evidence of pneumonia, pneumothorax or pulmonary edema.  This was reviewed by me.  Her labs show markedly elevated BNP at over thousand.  Her D-dimer was a bit elevated and CT chest was performed.  There is no evidence of PE but there is some pulmonary edema on CT.  She was given dose of Lasix and has diuresed quite a bit.  However she still is requiring oxygen.  She desaturates to about 87% off the oxygen so I placed her back on  2 L and she seems to be relatively stable on this.  I spoke with Dr. Myna Hidalgo who will admit the patient for further treatment. Final Clinical Impression(s) / ED Diagnoses Final diagnoses:  Acute on chronic congestive heart failure, unspecified heart failure type [facility])    Rx / DC Orders ED Discharge Orders    None       Malvin Johns, MD 03/19/21 2252    Malvin Johns, MD 03/19/21 2253

## 2021-03-19 NOTE — ED Notes (Signed)
Pt transported to XRAY °

## 2021-03-19 NOTE — ED Notes (Signed)
Pt's family is requesting that pt's leg is checked.

## 2021-03-19 NOTE — Progress Notes (Signed)
COMMUNITY PALLIATIVE CARE SW NOTE  PATIENT NAME: [name] DOB: 07/25/1931 MRN: [id number]  PRIMARY CARE PROVIDER: Jani Gravel, MD  RESPONSIBLE PARTY:  Acct ID - Guarantor Home Phone Work Phone Relationship Acct Type  [id number] Malva [phone]  Self P/F     [address], [address], [place] [address]     PLAN OF CARE and INTERVENTIONS:             1. GOALS OF CARE/ ADVANCE CARE PLANNING:  Goals of care is for patient to remain at the assisted living facility. Patient is a FULL CODE> 2. SOCIAL/EMOTIONAL/SPIRITUAL ASSESSMENT/ INTERVENTIONS:  SW completed visit with patient at [facility]. Patient was sitting in her wheelchair in her room. She greeted SW warmly and invited SW to come in and talk. Patient talked in scattered dialogue, but when she redirected to simple yes/no questions she could engage. She denied pain. Patient seemed to be pleasantly confused as she was able to give very little history on herself. However she remained engaged and displayed her social graces. SW consulted with the med tech-Nadia who report that patient was progressing well. Her appetite is good as she eats three meals a day. She is wheelchair bound and has a below right knee amputee. She requires assistance with personal care needs and toileting. She continues to have cellulitis in her left leg, which was wrapped. She has some anxiety and shortness of breath. SOCIAL HISTORY: Patient was born and raised in [place], [place]. 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 Patient has been widowed 2x. She had two sons, one is deceased. She enjoys fishing, crocheting and reading. Patient is [facility] and was involved in the [employer] study. Patient has funeral plans with [employer] ([place]) Her son [name] serve as her POA, but live out of town; and her niece-Cynthia is local and her PCG. SW provided a telephone introduction, update and support to her niece-Cynthia. SW provided education regarding  palliative care services to include role in patient's care, services and visit frequency. Caren Griffins provided verbal consent to ongoing palliative care support. She advised that  Patient's 02 has been dropping intermittently that causes patient to have shortness of breath-palliative care RN updated for follow-up. SW provided supportive presence, engaged patient in social dialogue, assessed her needs and comfort, consult with staff, chart review, support PCG and update to RN.  3. PATIENT/CAREGIVER EDUCATION/ COPING:  Patient appears to be coping well. She has good family support and seems to be adjusting well to the facility.  4. PERSONAL EMERGENCY PLAN:  Per facility protocol.  5. COMMUNITY RESOURCES COORDINATION/ HEALTH CARE NAVIGATION:  Patient is receiving nursing support through [facility] for wound care; [place] through [facility] and occupational therapy through [facility]. 6. FINANCIAL/LEGAL CONCERNS/INTERVENTIONS:  None.     SOCIAL HX:  Social History   Tobacco Use  . Smoking status: Never Smoker  . Smokeless tobacco: Never Used  Substance Use Topics  . Alcohol use: Not Currently    CODE STATUS: Full Code ADVANCED DIRECTIVES: None MOST FORM COMPLETE:  None HOSPICE EDUCATION PROVIDED: None  PPS: Patient is wheelchair bound, pleasantly confused and requires assistance with personal care needs.  Duration of visit and documentation: 60 minutes      Katheren Puller, LCSW

## 2021-03-19 NOTE — H&P (Signed)
History and Physical    Sharon DONALSON BJY:[id number] DOB: 1931-02-12 DOA: 03/19/2021  PCP: Jani Gravel, MD   Patient coming from: ALF  Chief Complaint: SOB   HPI: [name] is a 87 y.o. female with medical history significant for COPD, coronary artery disease, complete heart block with pacer, dementia, chronic kidney disease stage IIIa, hypertrophic cardiomyopathy status post septal myotomy, right AKA, and chronic diastolic CHF, now presenting to the emergency department with shortness of breath.  Patient is accompanied by her niece who assists with the history.  Patient has had worsening shortness of breath over the past several days, reports waking up from her sleep multiple times over the past couple nights due to breathlessness, and appeared to be much worse when laying flat today per report of family.  EMS was called out to her assisted living facility and administered a breathing treatment with no appreciable improvement prior to arrival in the ED.  Patient denies any chest pain, fevers, or chills.  She has had a mild nonproductive cough.  She reports rarely using her albuterol inhaler and has not used it recently.  At baseline, patient has mild confusion and memory loss, and propels herself in a wheelchair at her ALF.  ED Course: Upon arrival to the ED, patient is found to be afebrile, saturating mid 90s on 2 L/min of supplemental oxygen, and with stable blood pressure.  EKG features a paced rhythm and chest x-ray notable for cardiomegaly.  CTA chest is negative for PE but notable for cardiomegaly, small bilateral pleural effusions, and scattered groundglass opacities concerning for early edema.  Chemistry panel features a creatinine 1.15 and CBC notable for mild microcytosis without anemia.  D-dimer is 0.73 and BNP 1445.  COVID-19 screening test is negative.  Patient was given 60 mg IV Lasix in the ED.  ED physician reports that attempts were made to remove the supplemental oxygen but the  patient desaturated to the mid 80s while at rest.  Review of Systems:  All other systems reviewed and apart from HPI, are negative.  Past Medical History:  Diagnosis Date  . Acute on chronic diastolic CHF (congestive heart failure) ([place]) 05/18/2015  . Acute respiratory failure ([place]) 06/09/2016  . Anemia   . Arthritis   . Arthritis pain   . Asthma   . Blood transfusion   . Bronchitis   . Cancer ([place])    lt breast  . CHF (congestive heart failure) ([place])   . Constipation   . COPD (chronic obstructive pulmonary disease) ([place])   . Coronary artery disease   . Current use of long term anticoagulation   . Cystitis   . Dementia ([place])   . Encounter for care of pacemaker 06/13/2019  . Heart block    Complete heart block post surgical requiring pacer  . Humerus shaft fracture    nonsurgical, June 2017  . Hypertr obst cardiomyop    Required septal myotomy  . Hypokalemia    Postoperative, improved  . Hyponatremia    Mild postoperative, improved  . Memory difficulty   . Mitral regurgitation   . Pacemaker   . Renal disorder    Stage III  . Thyroid disease    hypothyroidism  . Urinary incontinence     Past Surgical History:  Procedure Laterality Date  . ABDOMINAL HYSTERECTOMY    . ABOVE KNEE LEG AMPUTATION Right   . Anterior cervical diskectomy and fusion     C-7  . BELOW KNEE LEG AMPUTATION Right   .  BREAST BIOPSY     Left, negative  . BREAST LUMPECTOMY  2012   left breast  . cardiomyopathy    . COLONOSCOPY    . FEMUR FRACTURE SURGERY     Right  . FRACTURE SURGERY    . JOINT REPLACEMENT    . ORIF ELBOW FRACTURE Right 04/08/2018   Procedure: OPEN REDUCTION INTERNAL FIXATION (ORIF) ELBOW/OLECRANON FRACTURE;  Surgeon: Renette Butters, MD;  Location: [facility];  Service: Orthopedics;  Laterality: Right;  . PACEMAKER PLACEMENT  2006/11-27-2013   MDT dual chamber pacemaker implanted 2006 with gen change by Dr Lovena Le 11-27-2013  . PERMANENT PACEMAKER GENERATOR CHANGE N/A  11/27/2013   Procedure: PERMANENT PACEMAKER GENERATOR CHANGE;  Surgeon: Evans Lance, MD;  Location: [facility];  Service: Cardiovascular;  Laterality: N/A;  . REPLACEMENT TOTAL KNEE BILATERAL    . Septal myotomy    . SHOULDER SURGERY     Right  . WRIST FRACTURE SURGERY     Right    Social History:   reports that she has never smoked. She has never used smokeless tobacco. She reports previous alcohol use. She reports that she does not use drugs.  Allergies  Allergen Reactions  . Codeine Nausea And Vomiting  . Morphine And Related Nausea And Vomiting  . Sulfa Antibiotics Nausea And Vomiting  . Ceftaroline Rash    Family History  Problem Relation Age of Onset  . Heart disease Mother   . Hypertension Mother   . Pancreatic cancer Mother   . Cancer Mother        pancreatic  . Pancreatic cancer Father   . Cancer Father        pancreatic  . Diabetes Brother   . Pancreatic cancer Other   . Heart disease Brother        Enlarged heart     Prior to Admission medications   Medication Sig Start Date End Date Taking? Authorizing Provider  acetaminophen (TYLENOL) 325 MG tablet Take 650 mg by mouth 2 (two) times daily as needed for mild pain or fever.   Yes [provider]  acetaminophen (TYLENOL) 500 MG tablet Take 1,000 mg by mouth 3 (three) times daily.   Yes [provider]  apixaban (ELIQUIS) 2.5 MG TABS tablet Take 1 tablet (2.5 mg total) by mouth 2 (two) times daily. 10/01/14  Yes Angiulli, Lavon Paganini, PA-C  docusate sodium (COLACE) 50 MG capsule Take 50 mg by mouth daily.   Yes [provider]  furosemide (LASIX) 20 MG tablet Take 1 tablet (20 mg total) by mouth daily. Patient taking differently: Take 10 mg by mouth daily. 02/18/19  Yes Adrian Prows, MD  gabapentin (NEURONTIN) 100 MG capsule Take 200 mg by mouth 2 (two) times daily. 12/27/18  Yes [provider]  hydrOXYzine (ATARAX/VISTARIL) 25 MG tablet Take 12.5 mg by mouth 3 (three) times  daily.   Yes [provider]  levothyroxine (SYNTHROID) 25 MCG tablet Take 25 mcg by mouth daily before breakfast.   Yes [provider]  Nutritional Supplements (ENSURE MAX PROTEIN PO) Take 1 Can by mouth in the morning and at bedtime.   Yes [provider]  polyethylene glycol powder (GLYCOLAX/MIRALAX) 17 GM/SCOOP powder Take 17 g by mouth daily as needed for mild constipation.   Yes [provider]    Physical Exam: Vitals:   03/19/21 1903 03/19/21 2030 03/19/21 2108 03/19/21 2130  BP: (!) 159/89 (!) 156/80 (!) 147/72 (!) 141/74  Pulse: 79 60  67 64  Resp: 20 13 (!) 21 (!) 23  Temp:      TempSrc:      SpO2: 93% 96% 96% 96%    Constitutional: NAD, calm  Eyes: PERTLA, lids and conjunctivae normal ENMT: Mucous membranes are moist. Posterior pharynx clear of any exudate or lesions.   Neck: normal, supple, no masses, no thyromegaly Respiratory: Dyspnea with speech. No pallor or cyanosis.  Cardiovascular: S1 & S2 heard.  LLE edema.  Abdomen: No distension, no tenderness, soft. Bowel sounds active.  Musculoskeletal: no clubbing / cyanosis. Status-post right AKA.    Skin: erythematous patches at lower LLE with crust. Warm, dry, well-perfused. Neurologic: CN 2-12 grossly intact. Sensation intact. Moving all extremities.  Psychiatric: Alert and oriented to person, place, and situation. Very pleasant and cooperative.    Labs and Imaging on Admission: I have personally reviewed following labs and imaging studies  CBC: Recent Labs  Lab 03/19/21 1704  WBC 8.9  NEUTROABS 6.3  HGB 12.1  HCT 39.1  MCV 100.5*  PLT 428   Basic Metabolic Panel: Recent Labs  Lab 03/19/21 1704  NA 138  K 4.2  CL 104  CO2 22  GLUCOSE 87  BUN 26*  CREATININE 1.15*  CALCIUM 9.1   GFR: CrCl cannot be calculated (Unknown ideal weight.). Liver Function Tests: No results for input(s): AST, ALT, ALKPHOS, BILITOT, PROT, ALBUMIN in the last 168 hours. No results for  input(s): LIPASE, AMYLASE in the last 168 hours. No results for input(s): AMMONIA in the last 168 hours. Coagulation Profile: No results for input(s): INR, PROTIME in the last 168 hours. Cardiac Enzymes: No results for input(s): CKTOTAL, CKMB, CKMBINDEX, TROPONINI in the last 168 hours. BNP (last 3 results) No results for input(s): PROBNP in the last 8760 hours. HbA1C: No results for input(s): HGBA1C in the last 72 hours. CBG: No results for input(s): GLUCAP in the last 168 hours. Lipid Profile: No results for input(s): CHOL, HDL, LDLCALC, TRIG, CHOLHDL, LDLDIRECT in the last 72 hours. Thyroid Function Tests: No results for input(s): TSH, T4TOTAL, FREET4, T3FREE, THYROIDAB in the last 72 hours. Anemia Panel: No results for input(s): VITAMINB12, FOLATE, FERRITIN, TIBC, IRON, RETICCTPCT in the last 72 hours. Urine analysis:    Component Value Date/Time   COLORURINE YELLOW 10/02/2019 1202   APPEARANCEUR CLEAR 10/02/2019 1202   LABSPEC 1.012 10/02/2019 1202   PHURINE 7.0 10/02/2019 1202   GLUCOSEU NEGATIVE 10/02/2019 1202   HGBUR NEGATIVE 10/02/2019 1202   BILIRUBINUR negative 05/21/2020 1230   KETONESUR negative 05/21/2020 1230   KETONESUR NEGATIVE 10/02/2019 1202   PROTEINUR negative 05/21/2020 1230   PROTEINUR 100 (A) 10/02/2019 1202   UROBILINOGEN 0.2 05/21/2020 1230   UROBILINOGEN 1.0 05/18/2015 0024   NITRITE Negative 05/21/2020 1230   NITRITE NEGATIVE 10/02/2019 1202   LEUKOCYTESUR Trace (A) 05/21/2020 1230   LEUKOCYTESUR MODERATE (A) 10/02/2019 1202   Sepsis Labs: @LABRCNTIP (procalcitonin:4,lacticidven:4) ) Recent Results (from the past 240 hour(s))  Resp Panel by RT-PCR (Flu A&B, Covid) Nasopharyngeal Swab     Status: None   Collection Time: 03/19/21  9:45 PM   Specimen: Nasopharyngeal Swab; Nasopharyngeal(NP) swabs in vial transport medium  Result Value Ref Range Status   SARS Coronavirus 2 by RT PCR NEGATIVE NEGATIVE Final    Comment: (NOTE) SARS-CoV-2 target  nucleic acids are NOT DETECTED.  The SARS-CoV-2 RNA is generally detectable in upper respiratory specimens during the acute phase of infection. The lowest concentration of SARS-CoV-2 viral copies this assay can detect is  138 copies/mL. A negative result does not preclude SARS-Cov-2 infection and should not be used as the sole basis for treatment or other patient management decisions. A negative result may occur with  improper specimen collection/handling, submission of specimen other than nasopharyngeal swab, presence of viral mutation(s) within the areas targeted by this assay, and inadequate number of viral copies(<138 copies/mL). A negative result must be combined with clinical observations, patient history, and epidemiological information. The expected result is Negative.  Fact Sheet for Patients:  EntrepreneurPulse.com.au  Fact Sheet for Healthcare Providers:  IncredibleEmployment.be  This test is no t yet approved or cleared by the [place] FDA and  has been authorized for detection and/or diagnosis of SARS-CoV-2 by FDA under an Emergency Use Authorization (EUA). This EUA will remain  in effect (meaning this test can be used) for the duration of the COVID-19 declaration under Section 564(b)(1) of the Act, 21 U.S.C.section 360bbb-3(b)(1), unless the authorization is terminated  or revoked sooner.       Influenza A by PCR NEGATIVE NEGATIVE Final   Influenza B by PCR NEGATIVE NEGATIVE Final    Comment: (NOTE) The Xpert Xpress SARS-CoV-2/FLU/RSV plus assay is intended as an aid in the diagnosis of influenza from Nasopharyngeal swab specimens and should not be used as a sole basis for treatment. Nasal washings and aspirates are unacceptable for Xpert Xpress SARS-CoV-2/FLU/RSV testing.  Fact Sheet for Patients: EntrepreneurPulse.com.au  Fact Sheet for Healthcare  Providers: IncredibleEmployment.be  This test is not yet approved or cleared by the [place] FDA and has been authorized for detection and/or diagnosis of SARS-CoV-2 by FDA under an Emergency Use Authorization (EUA). This EUA will remain in effect (meaning this test can be used) for the duration of the COVID-19 declaration under Section 564(b)(1) of the Act, 21 U.S.C. section 360bbb-3(b)(1), unless the authorization is terminated or revoked.  Performed at [facility], [place] [address], [place], [place] [address]      Radiological Exams on Admission: DG Chest 2 View  Result Date: 03/19/2021 CLINICAL DATA:  Shortness of breath EXAM: CHEST - 2 VIEW COMPARISON:  10/02/2019 FINDINGS: Surgical hardware in the right shoulder and cervical spine. Rotated patient. Post sternotomy changes. Enlarged cardiomediastinal silhouette with aortic atherosclerosis. No focal opacity, pleural effusion, or pneumothorax. Kyphosis of the spine. Right-sided pacing device, grossly stable allowing for rotation. IMPRESSION: No active cardiopulmonary disease. Cardiomegaly. Electronically Signed   By: Donavan Foil M.D.   On: 03/19/2021 16:47   CT Angio Chest PE W/Cm &/Or Wo Cm  Result Date: 03/19/2021 CLINICAL DATA:  Positive D-dimer EXAM: CT ANGIOGRAPHY CHEST WITH CONTRAST TECHNIQUE: Multidetector CT imaging of the chest was performed using the standard protocol during bolus administration of intravenous contrast. Multiplanar CT image reconstructions and MIPs were obtained to evaluate the vascular anatomy. CONTRAST:  55mL OMNIPAQUE IOHEXOL 350 MG/ML SOLN COMPARISON:  None. FINDINGS: Cardiovascular: No filling defects in the pulmonary arteries to suggest pulmonary emboli. Cardiomegaly. Densely calcified mitral valve, aortic valve and aorta. Prior CABG. No aneurysm. Mediastinum/Nodes: No mediastinal, hilar, or axillary adenopathy. Trachea and esophagus are unremarkable. Thyroid unremarkable.  Lungs/Pleura: Small bilateral pleural effusions. Scattered ground-glass opacities throughout the lungs could reflect early edema. Upper Abdomen: Imaging into the upper abdomen demonstrates no acute findings. Musculoskeletal: Chest wall soft tissues are unremarkable. No acute bony abnormality. Review of the MIP images confirms the above findings. IMPRESSION: No evidence of pulmonary embolus. Cardiomegaly. Scattered ground-glass opacities in the lungs could reflect early edema. Small bilateral effusions. Aortic Atherosclerosis (  ICD10-I70.0). Electronically Signed   By: Rolm Baptise M.D.   On: 03/19/2021 22:21    EKG: Independently reviewed. V-paced.   Assessment/Plan   1. Acute on chronic diastolic CHF; acute hypoxic respiratory failure  - Presents with progressive SOB over several days, reports orthopnea and PND, is improving with diuresis in ED but requiring supplemental O2 to maintain saturation in low 90s while at rest  - She was given Lasix 60 mg IV in ED and has >1 liter UOP already  - Taking 10 mg Lasix qD at home, will continue diuresis with 20 mg IV Lasix q12h, follow weight and I/Os, monitor renal function and electrolytes, and update echo   2. Atrial fibrillation  - CHADS-VASc at least 25 (age x2, gender, CHF)  - Continue Eliquis    3. CKD IIIa  - SCr is 1.15 on admission; baseline may be close to 1  - Renally-dose medications, monitor renal function while diuresing    4. Hypothyroidism  - Continue Synthroid    5. COPD  - No cough or wheeze on admission  - Continue as-needed albuterol    DVT prophylaxis: Eliquis  Code Status: Full  Level of Care: Level of care: Telemetry Cardiac Family Communication: Niece updated at bedside Disposition Plan:  Patient is from: ALF  Anticipated d/c is to: TBD Anticipated d/c date is: 03/23/21 Patient currently: pending improvement in respiratory status  Consults called: None  Admission status: Inpatient     Vianne Bulls, MD [facility]  03/20/2021, 12:55 AM

## 2021-03-20 ENCOUNTER — Other Ambulatory Visit (HOSPITAL_COMMUNITY): Payer: Medicare Other

## 2021-03-20 LAB — CBC
HCT: 37.6 % (ref 36.0–46.0)
Hemoglobin: 12 g/dL (ref 12.0–15.0)
MCH: 30.7 pg (ref 26.0–34.0)
MCHC: 31.9 g/dL (ref 30.0–36.0)
MCV: 96.2 fL (ref 80.0–100.0)
Platelets: 271 10*3/uL (ref 150–400)
RBC: 3.91 MIL/uL (ref 3.87–5.11)
RDW: 18.7 % — ABNORMAL HIGH (ref 11.5–15.5)
WBC: 9.4 10*3/uL (ref 4.0–10.5)
nRBC: 0 % (ref 0.0–0.2)

## 2021-03-20 LAB — BASIC METABOLIC PANEL
Anion gap: 13 (ref 5–15)
BUN: 22 mg/dL (ref 8–23)
CO2: 25 mmol/L (ref 22–32)
Calcium: 8.8 mg/dL — ABNORMAL LOW (ref 8.9–10.3)
Chloride: 99 mmol/L (ref 98–111)
Creatinine, Ser: 1.07 mg/dL — ABNORMAL HIGH (ref 0.44–1.00)
GFR, Estimated: 50 mL/min — ABNORMAL LOW (ref >60)
Glucose, Bld: 80 mg/dL (ref 70–99)
Potassium: 3.7 mmol/L (ref 3.5–5.1)
Sodium: 137 mmol/L (ref 135–145)

## 2021-03-20 MED ORDER — ALBUTEROL SULFATE HFA 108 (90 BASE) MCG/ACT IN AERS: 2.0000 | INHALATION_SPRAY | RESPIRATORY_TRACT | Status: DC | PRN

## 2021-03-20 NOTE — Evaluation (Signed)
Physical Therapy Evaluation Patient Details Name: [name] MRN: [id number] DOB: 03-Jan-1931 Today's Date: 03/20/2021   History of Present Illness  [name] is a 87 y.o. female with medical history significant for COPD, coronary artery disease, complete heart block with pacer, dementia, chronic kidney disease stage IIIa, hypertrophic cardiomyopathy status post septal myotomy, right AKA, and chronic diastolic CHF, admitted with shortness of breath due to CHF.  Clinical Impression  Patient presents with decreased independence and safety with transfers.  Does report she has help at ALF, but would benefit from skilled PT in the acute setting and follow up HHPT at ALF to improve safety and independence for decreased fall risk.     Follow Up Recommendations Home health PT (at ALF)    Equipment Recommendations  None recommended by PT    Recommendations for Other Services       Precautions / Restrictions Precautions Precautions: Fall Precaution Comments: R AKA      Mobility  Bed Mobility Overal bed mobility: Needs Assistance             General bed mobility comments: up in chair    Transfers Overall transfer level: Needs assistance Equipment used: Rolling walker (2 wheeled) Transfers: Sit to/from [facility] Sit to Stand: Min assist Stand pivot transfers: Min assist       General transfer comment: assist for balance/safety, initially sit to stand with RW, then when to [facility] pushed walker out of the way so she could pivot, but unsafe so gave min A even though she wanted to try on her own. back to recliner from [facility] with min A and better set up with chair closer.  Ambulation/Gait                Stairs            Wheelchair Mobility    Modified Rankin (Stroke Patients Only)       Balance Overall balance assessment: Needs assistance Sitting-balance support: Feet supported Sitting balance-Leahy Scale: Good Sitting balance - Comments:  can reach to floor and to L seated edge of recliner   Standing balance support: Bilateral upper extremity supported Standing balance-Leahy Scale: Poor Standing balance comment: UE support, assist for doffing and donning briefs in standing for toileting                             Pertinent Vitals/Pain Pain Assessment: Faces Faces Pain Scale: Hurts little more Pain Location: L posterior lower leg Pain Descriptors / Indicators: Sore;Tender Pain Intervention(s): Monitored during session;Repositioned    Home Living Family/patient expects to be discharged to:: Assisted living               Home Equipment: Wheelchair - manual;Bedside commode      Prior Function Level of Independence: Needs assistance   Gait / Transfers Assistance Needed: w/c level  ADL's / Homemaking Assistance Needed: assist with ADL's at ALF        Hand Dominance        Extremity/Trunk Assessment   Upper Extremity Assessment Upper Extremity Assessment: RUE deficits/detail RUE Deficits / Details: limited shoulder elevation    Lower Extremity Assessment Lower Extremity Assessment: RLE deficits/detail;LLE deficits/detail RLE Deficits / Details: R AKA with bony prominence tip of leg, does report she sometimes stabilizes with that leg, but it hurts LLE Deficits / Details: noted area posterior lower leg with skin sloughing and tender; lifts antigravity, but weakness evident  and c/o ankle pain with pivot transfers       Communication   Communication: No difficulties  Cognition Arousal/Alertness: Awake/alert Behavior During Therapy: WFL for tasks assessed/performed Overall Cognitive Status: No family/caregiver present to determine baseline cognitive functioning                                        General Comments      Exercises     Assessment/Plan    PT Assessment Patient needs continued PT services  PT Problem List Decreased strength;Decreased  mobility;Decreased safety awareness;Decreased balance       PT Treatment Interventions DME instruction;Therapeutic activities;Therapeutic exercise;Balance training;Functional mobility training;Wheelchair mobility training    PT Goals (Current goals can be found in the Care Plan section)  Acute Rehab PT Goals Patient Stated Goal: to return to ALF PT Goal Formulation: With patient Time For Goal Achievement: 04/03/21 Potential to Achieve Goals: Good    Frequency Min 2X/week   Barriers to discharge        Co-evaluation               AM-PAC PT "6 Clicks" Mobility  Outcome Measure Help needed turning from your back to your side while in a flat bed without using bedrails?: A Little Help needed moving from lying on your back to sitting on the side of a flat bed without using bedrails?: A Little Help needed moving to and from a bed to a chair (including a wheelchair)?: A Little Help needed standing up from a chair using your arms (e.g., wheelchair or bedside chair)?: A Little Help needed to walk in hospital room?: Total Help needed climbing 3-5 steps with a railing? : Total 6 Click Score: 14    End of Session   Activity Tolerance: Patient tolerated treatment well Patient left: in chair;with call bell/phone within reach;with chair alarm set   PT Visit Diagnosis: Muscle weakness (generalized) (M62.81);Other abnormalities of gait and mobility (R26.89)    Time: 1523-1550 PT Time Calculation (min) (ACUTE ONLY): 27 min   Charges:   PT Evaluation $PT Eval Moderate Complexity: 1 Mod PT Treatments $Therapeutic Activity: 8-22 mins        Magda Kiel, PT Acute Rehabilitation Services XAJOI:[phone] Office:[phone] 03/20/2021   Reginia Naas 03/20/2021, 4:51 PM

## 2021-03-20 NOTE — Progress Notes (Signed)
PROGRESS NOTE    [name]  ION:[id number] DOB: August 20, 1931 DOA: 03/19/2021 PCP: Jani Gravel, MD  Outpatient Specialists:   Brief Narrative:  As per H&P done by Dr. Christia Reading Opyd: "[name] is a 87 y.o. female with medical history significant for COPD, coronary artery disease, complete heart block with pacer, dementia, chronic kidney disease stage IIIa, hypertrophic cardiomyopathy status post septal myotomy, right AKA, and chronic diastolic CHF, now presenting to the emergency department with shortness of breath.  Patient is accompanied by her niece who assists with the history.  Patient has had worsening shortness of breath over the past several days, reports waking up from her sleep multiple times over the past couple nights due to breathlessness, and appeared to be much worse when laying flat today per report of family.  EMS was called out to her assisted living facility and administered a breathing treatment with no appreciable improvement prior to arrival in the ED.  Patient denies any chest pain, fevers, or chills.  She has had a mild nonproductive cough.  She reports rarely using her albuterol inhaler and has not used it recently.  At baseline, patient has mild confusion and memory loss, and propels herself in a wheelchair at her ALF.  ED Course: Upon arrival to the ED, patient is found to be afebrile, saturating mid 90s on 2 L/min of supplemental oxygen, and with stable blood pressure.  EKG features a paced rhythm and chest x-ray notable for cardiomegaly.  CTA chest is negative for PE but notable for cardiomegaly, small bilateral pleural effusions, and scattered groundglass opacities concerning for early edema.  Chemistry panel features a creatinine 1.15 and CBC notable for mild microcytosis without anemia.  D-dimer is 0.73 and BNP 1445.  COVID-19 screening test is negative.  Patient was given 60 mg IV Lasix in the ED.  ED physician reports that attempts were made to remove the supplemental  oxygen but the patient desaturated to the mid 80s while at rest".   Assessment & Plan:   Principal Problem:   Acute on chronic diastolic CHF (congestive heart failure) (HCC) Active Problems:   Acute respiratory failure with hypoxia (HCC)   Obstructive lung disease (generalized) (HCC)   Chronic kidney disease, stage 3a (HCC)   Chronic atrial fibrillation (HCC)   Hypothyroidism   1. Acute on chronic diastolic CHF; acute hypoxic respiratory failure  -Patient presented with progressive SOB over several days, orthopnea and PND. -Admitted with acute on chronic diastolic CHF. -Responding to diuretics. -Symptoms have improved significantly. -Echocardiogram is pending. -Patient is currently on IV Lasix 20 Mg twice daily. -Continue to monitor I's and O's, renal function and electrolytes.  2. Atrial fibrillation  - CHADS-VASc at least 51 (age x2, gender, CHF)  - Continue Eliquis    3. CKD IIIa  - SCr is 1.15 on admission; baseline may be close to 1  -serum creatinine is 1.07 today. -Continue to monitor closely. - Renally-dose medications.  4. Hypothyroidism  - Continue Synthroid    5. COPD  - No cough or wheeze on admission  - Continue as-needed albuterol    DVT prophylaxis: Eliquis Code Status: Full code Family Communication:  Disposition Plan: This will depend on hospital course   Consultants:   None  Procedures:   None  Antimicrobials:   None   Subjective: No new complaints. Shortness of breath has improved.  Objective: Vitals:   03/20/21 0308 03/20/21 0342 03/20/21 0344 03/20/21 1225  BP: (!) 152/85  (!) 149/75 139/66  Pulse: 72  63 66  Resp: (!) 21  (!) 24 18  Temp: 97.8 F (36.6 C)  97.8 F (36.6 C) 97.8 F (36.6 C)  TempSrc: Oral  Oral Oral  SpO2: 93%  90% 94%  Weight:  47.3 kg    Height:  5\' 3"  (1.6 m)      Intake/Output Summary (Last 24 hours) at 03/20/2021 1230 Last data filed at 03/19/2021 2338 Gross per 24 hour  Intake --  Output  1200 ml  Net -1200 ml   Filed Weights   03/20/21 0342  Weight: 47.3 kg    Examination:  General exam: Patient is awake and alert.  Appears calm and comfortable  Respiratory system: Decreased air entry. Respiratory effort normal. Cardiovascular system: S1 & S2  Gastrointestinal system: Abdomen is nondistended, soft and nontender. No organomegaly or masses felt. Normal bowel sounds heard. Central nervous system: Awake and alert.  Patient moves all extremities.  Extremities: Status post right BKA.  No edema of left lower extremity.  Patient has superficial ulcer left cough, however reported to be painful.  Data Reviewed: I have personally reviewed following labs and imaging studies  CBC: Recent Labs  Lab 03/19/21 1704 03/20/21 0349  WBC 8.9 9.4  NEUTROABS 6.3  --   HGB 12.1 12.0  HCT 39.1 37.6  MCV 100.5* 96.2  PLT 256 147   Basic Metabolic Panel: Recent Labs  Lab 03/19/21 1704 03/20/21 0349  NA 138 137  K 4.2 3.7  CL 104 99  CO2 22 25  GLUCOSE 87 80  BUN 26* 22  CREATININE 1.15* 1.07*  CALCIUM 9.1 8.8*   GFR: Estimated Creatinine Clearance: 27.7 mL/min (A) (by C-G formula based on SCr of 1.07 mg/dL (H)). Liver Function Tests: No results for input(s): AST, ALT, ALKPHOS, BILITOT, PROT, ALBUMIN in the last 168 hours. No results for input(s): LIPASE, AMYLASE in the last 168 hours. No results for input(s): AMMONIA in the last 168 hours. Coagulation Profile: No results for input(s): INR, PROTIME in the last 168 hours. Cardiac Enzymes: No results for input(s): CKTOTAL, CKMB, CKMBINDEX, TROPONINI in the last 168 hours. BNP (last 3 results) No results for input(s): PROBNP in the last 8760 hours. HbA1C: No results for input(s): HGBA1C in the last 72 hours. CBG: No results for input(s): GLUCAP in the last 168 hours. Lipid Profile: No results for input(s): CHOL, HDL, LDLCALC, TRIG, CHOLHDL, LDLDIRECT in the last 72 hours. Thyroid Function Tests: No results for  input(s): TSH, T4TOTAL, FREET4, T3FREE, THYROIDAB in the last 72 hours. Anemia Panel: No results for input(s): VITAMINB12, FOLATE, FERRITIN, TIBC, IRON, RETICCTPCT in the last 72 hours. Urine analysis:    Component Value Date/Time   COLORURINE YELLOW 10/02/2019 1202   APPEARANCEUR CLEAR 10/02/2019 1202   LABSPEC 1.012 10/02/2019 1202   PHURINE 7.0 10/02/2019 1202   GLUCOSEU NEGATIVE 10/02/2019 1202   HGBUR NEGATIVE 10/02/2019 1202   BILIRUBINUR negative 05/21/2020 1230   KETONESUR negative 05/21/2020 1230   KETONESUR NEGATIVE 10/02/2019 1202   PROTEINUR negative 05/21/2020 1230   PROTEINUR 100 (A) 10/02/2019 1202   UROBILINOGEN 0.2 05/21/2020 1230   UROBILINOGEN 1.0 05/18/2015 0024   NITRITE Negative 05/21/2020 1230   NITRITE NEGATIVE 10/02/2019 1202   LEUKOCYTESUR Trace (A) 05/21/2020 1230   LEUKOCYTESUR MODERATE (A) 10/02/2019 1202   Sepsis Labs: @LABRCNTIP (procalcitonin:4,lacticidven:4)  ) Recent Results (from the past 240 hour(s))  Resp Panel by RT-PCR (Flu A&B, Covid) Nasopharyngeal Swab     Status: None   Collection Time:  03/19/21  9:45 PM   Specimen: Nasopharyngeal Swab; Nasopharyngeal(NP) swabs in vial transport medium  Result Value Ref Range Status   SARS Coronavirus 2 by RT PCR NEGATIVE NEGATIVE Final    Comment: (NOTE) SARS-CoV-2 target nucleic acids are NOT DETECTED.  The SARS-CoV-2 RNA is generally detectable in upper respiratory specimens during the acute phase of infection. The lowest concentration of SARS-CoV-2 viral copies this assay can detect is 138 copies/mL. A negative result does not preclude SARS-Cov-2 infection and should not be used as the sole basis for treatment or other patient management decisions. A negative result may occur with  improper specimen collection/handling, submission of specimen other than nasopharyngeal swab, presence of viral mutation(s) within the areas targeted by this assay, and inadequate number of viral copies(<138  copies/mL). A negative result must be combined with clinical observations, patient history, and epidemiological information. The expected result is Negative.  Fact Sheet for Patients:  EntrepreneurPulse.com.au  Fact Sheet for Healthcare Providers:  IncredibleEmployment.be  This test is no t yet approved or cleared by the [place] FDA and  has been authorized for detection and/or diagnosis of SARS-CoV-2 by FDA under an Emergency Use Authorization (EUA). This EUA will remain  in effect (meaning this test can be used) for the duration of the COVID-19 declaration under Section 564(b)(1) of the Act, 21 U.S.C.section 360bbb-3(b)(1), unless the authorization is terminated  or revoked sooner.       Influenza A by PCR NEGATIVE NEGATIVE Final   Influenza B by PCR NEGATIVE NEGATIVE Final    Comment: (NOTE) The Xpert Xpress SARS-CoV-2/FLU/RSV plus assay is intended as an aid in the diagnosis of influenza from Nasopharyngeal swab specimens and should not be used as a sole basis for treatment. Nasal washings and aspirates are unacceptable for Xpert Xpress SARS-CoV-2/FLU/RSV testing.  Fact Sheet for Patients: EntrepreneurPulse.com.au  Fact Sheet for Healthcare Providers: IncredibleEmployment.be  This test is not yet approved or cleared by the [place] FDA and has been authorized for detection and/or diagnosis of SARS-CoV-2 by FDA under an Emergency Use Authorization (EUA). This EUA will remain in effect (meaning this test can be used) for the duration of the COVID-19 declaration under Section 564(b)(1) of the Act, 21 U.S.C. section 360bbb-3(b)(1), unless the authorization is terminated or revoked.  Performed at [facility], [place] [address] [place], [place] [address]          Radiology Studies: DG Chest 2 View  Result Date: 03/19/2021 CLINICAL DATA:  Shortness of breath EXAM: CHEST - 2 VIEW  COMPARISON:  10/02/2019 FINDINGS: Surgical hardware in the right shoulder and cervical spine. Rotated patient. Post sternotomy changes. Enlarged cardiomediastinal silhouette with aortic atherosclerosis. No focal opacity, pleural effusion, or pneumothorax. Kyphosis of the spine. Right-sided pacing device, grossly stable allowing for rotation. IMPRESSION: No active cardiopulmonary disease. Cardiomegaly. Electronically Signed   By: Donavan Foil M.D.   On: 03/19/2021 16:47   CT Angio Chest PE W/Cm &/Or Wo Cm  Result Date: 03/19/2021 CLINICAL DATA:  Positive D-dimer EXAM: CT ANGIOGRAPHY CHEST WITH CONTRAST TECHNIQUE: Multidetector CT imaging of the chest was performed using the standard protocol during bolus administration of intravenous contrast. Multiplanar CT image reconstructions and MIPs were obtained to evaluate the vascular anatomy. CONTRAST:  72mL OMNIPAQUE IOHEXOL 350 MG/ML SOLN COMPARISON:  None. FINDINGS: Cardiovascular: No filling defects in the pulmonary arteries to suggest pulmonary emboli. Cardiomegaly. Densely calcified mitral valve, aortic valve and aorta. Prior CABG. No aneurysm. Mediastinum/Nodes: No mediastinal, hilar, or axillary adenopathy.  Trachea and esophagus are unremarkable. Thyroid unremarkable. Lungs/Pleura: Small bilateral pleural effusions. Scattered ground-glass opacities throughout the lungs could reflect early edema. Upper Abdomen: Imaging into the upper abdomen demonstrates no acute findings. Musculoskeletal: Chest wall soft tissues are unremarkable. No acute bony abnormality. Review of the MIP images confirms the above findings. IMPRESSION: No evidence of pulmonary embolus. Cardiomegaly. Scattered ground-glass opacities in the lungs could reflect early edema. Small bilateral effusions. Aortic Atherosclerosis (ICD10-I70.0). Electronically Signed   By: Rolm Baptise M.D.   On: 03/19/2021 22:21        Scheduled Meds: . apixaban  2.5 mg Oral BID  . docusate sodium  50 mg Oral  Daily  . furosemide  20 mg Intravenous Q12H  . gabapentin  200 mg Oral BID  . levothyroxine  25 mcg Oral QAC breakfast   Continuous Infusions: . sodium chloride       LOS: 1 day    Time spent: 35 minutes    Dana Allan, MD  [facility] Pager #: [phone] 7PM-7AM contact night coverage as above

## 2021-03-20 NOTE — Consult Note (Signed)
[facility] Nurse Consult Note: Reason for Consult: Bilateral LE. I am assisted in my assessment today by the patient's bedside RN,  I. Haka.  Pretibial skin tears, L>R  Wound type:Traumatic Pressure Injury POA: N/A Measurement: Left LE:  2.5cm x 1.5cm Right LE: 1.5cm x 1cm Wound bed: Left: closed with dried serum, small amt yellow exudate Right:  Closed with dried serum Drainage (amount, consistency, odor)  Periwound: Dressing procedure/placement/frequency: I will provide the patient with both a sacral prophylactic foam dressing and bilateral heel pressure redistribution heel devices to prevent pressure injuries.  Topical care to the traumatic wounds on the bilateral pretibial areas will be with xeroform gauze secured with Kerlix roll gauze and changed daily.  [facility] nursing team will not follow, but will remain available to this patient, the nursing and medical teams.  Please re-consult if needed. Thanks, Maudie Flakes, MSN, RN, [place], Arther Abbott  Pager# [phone]

## 2021-03-21 ENCOUNTER — Inpatient Hospital Stay (HOSPITAL_COMMUNITY): Payer: Medicare Other

## 2021-03-21 DIAGNOSIS — I5031 Acute diastolic (congestive) heart failure: Secondary | ICD-10-CM

## 2021-03-21 LAB — ECHOCARDIOGRAM COMPLETE
AR max vel: 1.38 cm2
AV Area VTI: 1.36 cm2
AV Area mean vel: 1.42 cm2
AV Mean grad: 3 mmHg
AV Peak grad: 6.3 mmHg
Ao pk vel: 1.25 m/s
Area-P 1/2: 5.66 cm2
Calc EF: 37.7 %
Height: 63 in
MV M vel: 5.24 m/s
MV Peak grad: 109.8 mmHg
MV VTI: 1.07 cm2
Radius: 0.8 cm
S' Lateral: 2.6 cm
Single Plane A2C EF: 32.7 %
Single Plane A4C EF: 37.1 %
Weight: 1693.13 oz

## 2021-03-21 LAB — BASIC METABOLIC PANEL
Anion gap: 9 (ref 5–15)
BUN: 20 mg/dL (ref 8–23)
CO2: 26 mmol/L (ref 22–32)
Calcium: 8.9 mg/dL (ref 8.9–10.3)
Chloride: 103 mmol/L (ref 98–111)
Creatinine, Ser: 0.98 mg/dL (ref 0.44–1.00)
GFR, Estimated: 56 mL/min — ABNORMAL LOW (ref >60)
Glucose, Bld: 87 mg/dL (ref 70–99)
Potassium: 3.5 mmol/L (ref 3.5–5.1)
Sodium: 138 mmol/L (ref 135–145)

## 2021-03-21 MED ORDER — POTASSIUM CHLORIDE 20 MEQ PO PACK
40.0000 meq | PACK | Freq: Once | ORAL | Status: AC
  Administered 2021-03-21: 40 meq via ORAL
  Filled 2021-03-21: qty 2

## 2021-03-21 MED ORDER — FUROSEMIDE 20 MG PO TABS
20.0000 mg | ORAL_TABLET | Freq: Every day | ORAL | Status: DC
  Administered 2021-03-22 – 2021-03-24 (×3): 20 mg via ORAL
  Filled 2021-03-21 (×3): qty 1

## 2021-03-21 NOTE — Progress Notes (Signed)
PROGRESS NOTE    [name]  YYT:[id number] DOB: 08-09-1931 DOA: 03/19/2021 PCP: Jani Gravel, MD  Outpatient Specialists:   Brief Narrative:  As per H&P done by Dr. Christia Reading Opyd: "[name] is a 87 y.o. female with medical history significant for COPD, coronary artery disease, complete heart block with pacer, dementia, chronic kidney disease stage IIIa, hypertrophic cardiomyopathy status post septal myotomy, right AKA, and chronic diastolic CHF, now presenting to the emergency department with shortness of breath.  Patient is accompanied by her niece who assists with the history.  Patient has had worsening shortness of breath over the past several days, reports waking up from her sleep multiple times over the past couple nights due to breathlessness, and appeared to be much worse when laying flat today per report of family.  EMS was called out to her assisted living facility and administered a breathing treatment with no appreciable improvement prior to arrival in the ED.  Patient denies any chest pain, fevers, or chills.  She has had a mild nonproductive cough.  She reports rarely using her albuterol inhaler and has not used it recently.  At baseline, patient has mild confusion and memory loss, and propels herself in a wheelchair at her ALF.  ED Course: Upon arrival to the ED, patient is found to be afebrile, saturating mid 90s on 2 L/min of supplemental oxygen, and with stable blood pressure.  EKG features a paced rhythm and chest x-ray notable for cardiomegaly.  CTA chest is negative for PE but notable for cardiomegaly, small bilateral pleural effusions, and scattered groundglass opacities concerning for early edema.  Chemistry panel features a creatinine 1.15 and CBC notable for mild microcytosis without anemia.  D-dimer is 0.73 and BNP 1445.  COVID-19 screening test is negative.  Patient was given 60 mg IV Lasix in the ED.  ED physician reports that attempts were made to remove the supplemental  oxygen but the patient desaturated to the mid 80s while at rest".  03/21/2021: Patient seen.  CHF symptoms have improved significantly.  We will transition from IV diuretics to oral.  Likely discharge tomorrow.   Assessment & Plan:   Principal Problem:   Acute on chronic diastolic CHF (congestive heart failure) (HCC) Active Problems:   Acute respiratory failure with hypoxia (HCC)   Obstructive lung disease (generalized) (HCC)   Chronic kidney disease, stage 3a (HCC)   Chronic atrial fibrillation (HCC)   Hypothyroidism   1. Acute on chronic diastolic CHF; acute hypoxic respiratory failure  -Patient presented with progressive SOB over several days, orthopnea and PND. -Admitted with acute on chronic diastolic CHF. -Responding to diuretics. -Symptoms have improved significantly. -Echocardiogram is pending. -Patient is currently on IV Lasix 20 Mg twice daily. -Continue to monitor I's and O's, renal function and electrolytes. 03/21/2021: Symptoms have improved significantly.  Will change IV Lasix to oral Lasix.  Discharge tomorrow.  2. Atrial fibrillation  - CHADS-VASc at least 75 (age x2, gender, CHF)  - Continue Eliquis    3. CKD IIIa  - SCr is 1.15 on admission; baseline may be close to 1  -serum creatinine is 1.07 today. -Continue to monitor closely. - Renally-dose medications. 03/21/2021: Stable renal function.  4. Hypothyroidism  - Continue Synthroid    5. COPD  - No cough or wheeze on admission  - Continue as-needed albuterol    DVT prophylaxis: Eliquis Code Status: Full code Family Communication:  Disposition Plan: This will depend on hospital course   Consultants:  None  Procedures:   None  Antimicrobials:   None   Subjective: No new complaints. No shortness of breath.    Objective: Vitals:   03/21/21 1033 03/21/21 1035 03/21/21 1037 03/21/21 1121  BP: 116/74 135/74 109/76 122/64  Pulse: 79 86 83 68  Resp:    20  Temp:    (!) 97.3 F (36.3  C)  TempSrc:    Oral  SpO2: 93% 95% 92% 97%  Weight:      Height:        Intake/Output Summary (Last 24 hours) at 03/21/2021 1458 Last data filed at 03/21/2021 1225 Gross per 24 hour  Intake 820 ml  Output 2200 ml  Net -1380 ml   Filed Weights   03/20/21 0342 03/21/21 0013 03/21/21 0500  Weight: 47.3 kg 48 kg 48 kg    Examination:  General exam: Patient is awake and alert.  Appears calm and comfortable  Respiratory system: Decreased air entry. Respiratory effort normal. Cardiovascular system: S1 & S2  Gastrointestinal system: Abdomen is nondistended, soft and nontender. No organomegaly or masses felt. Normal bowel sounds heard. Central nervous system: Awake and alert.  Patient moves all extremities.  Extremities: Status post right BKA.  No edema of left lower extremity.  Patient has superficial ulcer left cough, however reported to be painful.  Data Reviewed: I have personally reviewed following labs and imaging studies  CBC: Recent Labs  Lab 03/19/21 1704 03/20/21 0349  WBC 8.9 9.4  NEUTROABS 6.3  --   HGB 12.1 12.0  HCT 39.1 37.6  MCV 100.5* 96.2  PLT 256 086   Basic Metabolic Panel: Recent Labs  Lab 03/19/21 1704 03/20/21 0349 03/21/21 0317  NA 138 137 138  K 4.2 3.7 3.5  CL 104 99 103  CO2 22 25 26   GLUCOSE 87 80 87  BUN 26* 22 20  CREATININE 1.15* 1.07* 0.98  CALCIUM 9.1 8.8* 8.9   GFR: Estimated Creatinine Clearance: 30.6 mL/min (by C-G formula based on SCr of 0.98 mg/dL). Liver Function Tests: No results for input(s): AST, ALT, ALKPHOS, BILITOT, PROT, ALBUMIN in the last 168 hours. No results for input(s): LIPASE, AMYLASE in the last 168 hours. No results for input(s): AMMONIA in the last 168 hours. Coagulation Profile: No results for input(s): INR, PROTIME in the last 168 hours. Cardiac Enzymes: No results for input(s): CKTOTAL, CKMB, CKMBINDEX, TROPONINI in the last 168 hours. BNP (last 3 results) No results for input(s): PROBNP in the last  8760 hours. HbA1C: No results for input(s): HGBA1C in the last 72 hours. CBG: No results for input(s): GLUCAP in the last 168 hours. Lipid Profile: No results for input(s): CHOL, HDL, LDLCALC, TRIG, CHOLHDL, LDLDIRECT in the last 72 hours. Thyroid Function Tests: No results for input(s): TSH, T4TOTAL, FREET4, T3FREE, THYROIDAB in the last 72 hours. Anemia Panel: No results for input(s): VITAMINB12, FOLATE, FERRITIN, TIBC, IRON, RETICCTPCT in the last 72 hours. Urine analysis:    Component Value Date/Time   COLORURINE YELLOW 10/02/2019 1202   APPEARANCEUR CLEAR 10/02/2019 1202   LABSPEC 1.012 10/02/2019 1202   PHURINE 7.0 10/02/2019 1202   GLUCOSEU NEGATIVE 10/02/2019 1202   HGBUR NEGATIVE 10/02/2019 1202   BILIRUBINUR negative 05/21/2020 1230   KETONESUR negative 05/21/2020 1230   KETONESUR NEGATIVE 10/02/2019 1202   PROTEINUR negative 05/21/2020 1230   PROTEINUR 100 (A) 10/02/2019 1202   UROBILINOGEN 0.2 05/21/2020 1230   UROBILINOGEN 1.0 05/18/2015 0024   NITRITE Negative 05/21/2020 1230   NITRITE NEGATIVE 10/02/2019  [place] (A) 05/21/2020 1230   LEUKOCYTESUR MODERATE (A) 10/02/2019 1202   Sepsis Labs: @LABRCNTIP (procalcitonin:4,lacticidven:4)  ) Recent Results (from the past 240 hour(s))  Resp Panel by RT-PCR (Flu A&B, Covid) Nasopharyngeal Swab     Status: None   Collection Time: 03/19/21  9:45 PM   Specimen: Nasopharyngeal Swab; Nasopharyngeal(NP) swabs in vial transport medium  Result Value Ref Range Status   SARS Coronavirus 2 by RT PCR NEGATIVE NEGATIVE Final    Comment: (NOTE) SARS-CoV-2 target nucleic acids are NOT DETECTED.  The SARS-CoV-2 RNA is generally detectable in upper respiratory specimens during the acute phase of infection. The lowest concentration of SARS-CoV-2 viral copies this assay can detect is 138 copies/mL. A negative result does not preclude SARS-Cov-2 infection and should not be used as the sole basis for treatment  or other patient management decisions. A negative result may occur with  improper specimen collection/handling, submission of specimen other than nasopharyngeal swab, presence of viral mutation(s) within the areas targeted by this assay, and inadequate number of viral copies(<138 copies/mL). A negative result must be combined with clinical observations, patient history, and epidemiological information. The expected result is Negative.  Fact Sheet for Patients:  EntrepreneurPulse.com.au  Fact Sheet for Healthcare Providers:  IncredibleEmployment.be  This test is no t yet approved or cleared by the [place] FDA and  has been authorized for detection and/or diagnosis of SARS-CoV-2 by FDA under an Emergency Use Authorization (EUA). This EUA will remain  in effect (meaning this test can be used) for the duration of the COVID-19 declaration under Section 564(b)(1) of the Act, 21 U.S.C.section 360bbb-3(b)(1), unless the authorization is terminated  or revoked sooner.       Influenza A by PCR NEGATIVE NEGATIVE Final   Influenza B by PCR NEGATIVE NEGATIVE Final    Comment: (NOTE) The Xpert Xpress SARS-CoV-2/FLU/RSV plus assay is intended as an aid in the diagnosis of influenza from Nasopharyngeal swab specimens and should not be used as a sole basis for treatment. Nasal washings and aspirates are unacceptable for Xpert Xpress SARS-CoV-2/FLU/RSV testing.  Fact Sheet for Patients: EntrepreneurPulse.com.au  Fact Sheet for Healthcare Providers: IncredibleEmployment.be  This test is not yet approved or cleared by the [place] FDA and has been authorized for detection and/or diagnosis of SARS-CoV-2 by FDA under an Emergency Use Authorization (EUA). This EUA will remain in effect (meaning this test can be used) for the duration of the COVID-19 declaration under Section 564(b)(1) of the Act, 21 U.S.C. section  360bbb-3(b)(1), unless the authorization is terminated or revoked.  Performed at [facility], [place] [address], [place], [place] [address]          Radiology Studies: DG Chest 2 View  Result Date: 03/19/2021 CLINICAL DATA:  Shortness of breath EXAM: CHEST - 2 VIEW COMPARISON:  10/02/2019 FINDINGS: Surgical hardware in the right shoulder and cervical spine. Rotated patient. Post sternotomy changes. Enlarged cardiomediastinal silhouette with aortic atherosclerosis. No focal opacity, pleural effusion, or pneumothorax. Kyphosis of the spine. Right-sided pacing device, grossly stable allowing for rotation. IMPRESSION: No active cardiopulmonary disease. Cardiomegaly. Electronically Signed   By: Donavan Foil M.D.   On: 03/19/2021 16:47   CT Angio Chest PE W/Cm &/Or Wo Cm  Result Date: 03/19/2021 CLINICAL DATA:  Positive D-dimer EXAM: CT ANGIOGRAPHY CHEST WITH CONTRAST TECHNIQUE: Multidetector CT imaging of the chest was performed using the standard protocol during bolus administration of intravenous contrast. Multiplanar CT image reconstructions and MIPs were obtained  to evaluate the vascular anatomy. CONTRAST:  64mL OMNIPAQUE IOHEXOL 350 MG/ML SOLN COMPARISON:  None. FINDINGS: Cardiovascular: No filling defects in the pulmonary arteries to suggest pulmonary emboli. Cardiomegaly. Densely calcified mitral valve, aortic valve and aorta. Prior CABG. No aneurysm. Mediastinum/Nodes: No mediastinal, hilar, or axillary adenopathy. Trachea and esophagus are unremarkable. Thyroid unremarkable. Lungs/Pleura: Small bilateral pleural effusions. Scattered ground-glass opacities throughout the lungs could reflect early edema. Upper Abdomen: Imaging into the upper abdomen demonstrates no acute findings. Musculoskeletal: Chest wall soft tissues are unremarkable. No acute bony abnormality. Review of the MIP images confirms the above findings. IMPRESSION: No evidence of pulmonary embolus. Cardiomegaly. Scattered  ground-glass opacities in the lungs could reflect early edema. Small bilateral effusions. Aortic Atherosclerosis (ICD10-I70.0). Electronically Signed   By: Rolm Baptise M.D.   On: 03/19/2021 22:21   ECHOCARDIOGRAM COMPLETE  Result Date: 03/21/2021    ECHOCARDIOGRAM REPORT   Patient Name:   [name] Date of Exam: 03/21/2021 Medical Rec #:  [id number]     Height:       63.0 in Accession #:    [id number]    Weight:       105.8 lb Date of Birth:  1931-08-06     BSA:          1.475 m Patient Age:    46 years      BP:           136/86 mmHg Patient Gender: F             HR:           71 bpm. Exam Location:  Inpatient Procedure: 2D Echo, Cardiac Doppler and Color Doppler Indications:    CHF-Acute Diastolic  History:        Patient has prior history of Echocardiogram examinations, most                 recent 10/04/2019. CHF and Hypertrophic Cardiomyopathy, CAD,                 Pacemaker, COPD, Mitral Valve Disease, Arrythmias:Atrial                 Fibrillation; Signs/Symptoms:Shortness of Breath. Heart block.                 CKD. S/P septal myotomy.  Sonographer:    Clayton Lefort RDCS (AE) Referring Phys: [id number] TIMOTHY S OPYD  Sonographer Comments: Technically difficult study due to poor echo windows and no subcostal window. Image acquisition challenging due to patient body habitus. Patient in Fowler's position due to SOB. IMPRESSIONS  1. Left ventricular ejection fraction, by best estimation, is 50 to 55%. The left ventricle has low normal function and not well visualized in all walls. Left ventricular endocardial border not optimally defined to evaluate regional wall motion. There is severe concentric left ventricular hypertrophy. Left ventricular diastolic parameters are indeterminate. Phenotypical suggestive of hypertrophic cardiomyopathy without LVOT obstruction.  2. Right ventricular systolic function is low normal. The right ventricular size is mildly enlarged.  3. Left atrial size was severely dilated.  4. Right  atrial size was severely dilated.  5. The mitral valve is abnormal. Moderate to severe mitral valve regurgitation. Severe mitral annular calcification.  6. Tricuspid valve regurgitation is severe. Presence of diastolic tricuspid regurgitation may indicated part of the mechanism is related to device.  7. The aortic valve is tricuspid. There is moderate calcification of the aortic valve. Aortic valve regurgitation is not visualized. Mild aortic valve  sclerosis is present, with no evidence of aortic valve stenosis. Comparison(s): A prior study was performed on 10/04/2019. Valvular heart disease is worse. FINDINGS  Left Ventricle: Left ventricular ejection fraction, by estimation, is 50 to 55%. The left ventricle has low normal function. Left ventricular endocardial border not optimally defined to evaluate regional wall motion. The left ventricular internal cavity  size was small. There is severe concentric left ventricular hypertrophy. Left ventricular diastolic parameters are indeterminate. Right Ventricle: The right ventricular size is mildly enlarged. No increase in right ventricular wall thickness. Right ventricular systolic function is low normal. Left Atrium: Left atrial size was severely dilated. Right Atrium: Right atrial size was severely dilated. Pericardium: There is no evidence of pericardial effusion. Mitral Valve: The mitral valve is abnormal. There is severe thickening of the mitral valve leaflet(s). There is severe calcification of the mitral valve leaflet(s). Severe mitral annular calcification. Moderate to severe mitral valve regurgitation. MV peak gradient, 6.9 mmHg. The mean mitral valve gradient is 2.0 mmHg. Tricuspid Valve: The tricuspid valve is normal in structure. Tricuspid valve regurgitation is severe. Aortic Valve: The aortic valve is tricuspid. There is moderate calcification of the aortic valve. Aortic valve regurgitation is not visualized. Mild aortic valve sclerosis is present, with no  evidence of aortic valve stenosis. Aortic valve mean gradient measures 3.0 mmHg. Aortic valve peak gradient measures 6.2 mmHg. Aortic valve area, by VTI measures 1.36 cm. Pulmonic Valve: The pulmonic valve was grossly normal. Pulmonic valve regurgitation is mild to moderate. No evidence of pulmonic stenosis. Aorta: The aortic root and ascending aorta are structurally normal, with no evidence of dilitation. IAS/Shunts: The atrial septum is grossly normal. Additional Comments: A device lead is visualized.  LEFT VENTRICLE PLAX 2D LVIDd:         3.50 cm LVIDs:         2.60 cm LV PW:         2.20 cm LV IVS:        2.30 cm LVOT diam:     1.70 cm LV SV:         30 LV SV Index:   20 LVOT Area:     2.27 cm  LV Volumes (MOD) LV vol d, MOD A2C: 55.4 ml LV vol d, MOD A4C: 51.0 ml LV vol s, MOD A2C: 37.3 ml LV vol s, MOD A4C: 32.1 ml LV SV MOD A2C:     18.1 ml LV SV MOD A4C:     51.0 ml LV SV MOD BP:      22.3 ml RIGHT VENTRICLE RV Basal diam:  4.40 cm RV Mid diam:    3.00 cm RV S prime:     6.24 cm/s TAPSE (M-mode): 1.2 cm LEFT ATRIUM              Index        RIGHT ATRIUM           Index LA diam:        5.10 cm  3.46 cm/m   RA Area:     29.40 cm LA Vol (A2C):   172.0 ml 116.59 ml/m RA Volume:   106.00 ml 71.85 ml/m LA Vol (A4C):   153.0 ml 103.71 ml/m LA Biplane Vol: 162.0 ml 109.81 ml/m  AORTIC VALVE AV Area (Vmax):    1.38 cm AV Area (Vmean):   1.42 cm AV Area (VTI):     1.36 cm AV Vmax:  125.00 cm/s AV Vmean:          75.900 cm/s AV VTI:            0.218 m AV Peak Grad:      6.2 mmHg AV Mean Grad:      3.0 mmHg LVOT Vmax:         76.00 cm/s LVOT Vmean:        47.600 cm/s LVOT VTI:          0.131 m LVOT/AV VTI ratio: 0.60  AORTA Ao Root diam: 3.30 cm Ao Asc diam:  3.40 cm MITRAL VALVE                 TRICUSPID VALVE MV Area (PHT): 5.66 cm      TR Peak grad:   56.2 mmHg MV Area VTI:   1.07 cm      TR Vmax:        375.00 cm/s MV Peak grad:  6.9 mmHg MV Mean grad:  2.0 mmHg      SHUNTS MV Vmax:       1.31  m/s      Systemic VTI:  0.13 m MV Vmean:      57.4 cm/s     Systemic Diam: 1.70 cm MV Decel Time: 134 msec MR Peak grad:    109.8 mmHg MR Mean grad:    74.0 mmHg MR Vmax:         524.00 cm/s MR Vmean:        402.0 cm/s MR PISA:         4.02 cm MR PISA Eff ROA: 31 mm MR PISA Radius:  0.80 cm MV E velocity: 133.00 cm/s MV A velocity: 46.50 cm/s MV E/A ratio:  2.86 Rudean Haskell MD Electronically signed by Rudean Haskell MD Signature Date/Time: 03/21/2021/2:12:36 PM    Final         Scheduled Meds: . apixaban  2.5 mg Oral BID  . docusate sodium  50 mg Oral Daily  . [START ON 03/22/2021] furosemide  20 mg Oral Daily  . gabapentin  200 mg Oral BID  . levothyroxine  25 mcg Oral QAC breakfast   Continuous Infusions: . sodium chloride       LOS: 2 days    Time spent: 25 minutes    Dana Allan, MD  [facility] Pager #: [phone] 7PM-7AM contact night coverage as above

## 2021-03-22 DIAGNOSIS — I509 Heart failure, unspecified: Secondary | ICD-10-CM

## 2021-03-22 LAB — BASIC METABOLIC PANEL
Anion gap: 10 (ref 5–15)
BUN: 20 mg/dL (ref 8–23)
CO2: 30 mmol/L (ref 22–32)
Calcium: 9.6 mg/dL (ref 8.9–10.3)
Chloride: 101 mmol/L (ref 98–111)
Creatinine, Ser: 1.03 mg/dL — ABNORMAL HIGH (ref 0.44–1.00)
GFR, Estimated: 53 mL/min — ABNORMAL LOW (ref >60)
Glucose, Bld: 94 mg/dL (ref 70–99)
Potassium: 4.1 mmol/L (ref 3.5–5.1)
Sodium: 141 mmol/L (ref 135–145)

## 2021-03-22 NOTE — Evaluation (Signed)
Occupational Therapy Evaluation Patient Details Name: [name] MRN: [id number] DOB: 1931/11/01 Today's Date: 03/22/2021    History of Present Illness [name] is a 87 y.o. female with medical history significant for COPD, coronary artery disease, complete heart block with pacer, dementia, chronic kidney disease stage IIIa, hypertrophic cardiomyopathy status post septal myotomy, right AKA, and chronic diastolic CHF, admitted with shortness of breath due to CHF.   Clinical Impression   PATIENT WANTS TO DC TO SNF FOR REHAB PRIOR TO RETURNING TO ALF. PATIENT HAS DECREASED I AND SAFETY WITH ADLS AND ADL TRANSFERS. PATIENT FAMILY STATES SHE IS WC LEVEL FOR MOBILITY AND SHE HAS NOT WORN PROSTHETIC LEG IN 2 YEARS. PATIENT WANTS TO INCREASE I AND SAFETY WITH ADLS AND MOBILITY. PATIENT TO BE SEEN BY OT WHILE SHE IS IN HOSPITAL.     Follow Up Recommendations  SNF    Equipment Recommendations  None recommended by OT    Recommendations for Other Services       Precautions / Restrictions Precautions Precautions: Fall Precaution Comments: R AKA Restrictions Weight Bearing Restrictions: No      Mobility Bed Mobility Overal bed mobility: Needs Assistance Bed Mobility: Supine to Sit     Supine to sit: Min assist     General bed mobility comments: Min A for trunk elevation. Increased time required.    Transfers Overall transfer level: Needs assistance Equipment used: Rolling walker (2 wheeled) Transfers: Stand Pivot Transfers Sit to Stand: Min assist Stand pivot transfers: Min assist;Mod assist       General transfer comment: Difficulty with standing initially. Min A. Pt unable to perform hop steps and performed pivotal steps. Pt with posterior lean. Was able to perform stand pivot transfer X2.    Balance Overall balance assessment: Needs assistance Sitting-balance support: Feet supported Sitting balance-Leahy Scale: Good     Standing balance support: Bilateral upper  extremity supported Standing balance-Leahy Scale: Poor Standing balance comment: Reliant on BUE support and external support                           ADL either performed or assessed with clinical judgement   ADL Overall ADL's : Needs assistance/impaired Eating/Feeding: Independent   Grooming: Wash/dry hands;Wash/dry face;Sitting;Set up   Upper Body Bathing: Set up;Sitting;Minimal assistance   Lower Body Bathing: Maximal assistance;Sit to/from stand   Upper Body Dressing : Minimal assistance;Sitting   Lower Body Dressing: Total assistance;Sit to/from stand   Toilet Transfer: Minimal assistance;BSC;Moderate assistance   Toileting- Clothing Manipulation and Hygiene: Total assistance;Sit to/from stand       Functional mobility during ADLs: Minimal assistance;Rolling walker;Moderate assistance General ADL Comments: PATIENT SIT ON BED FOR LE ADLS AND STATES SHE DOES BETTER THAT WAY.PATIENT NEEDS FURTHER ED ON USE OF AE. PATIENT STATES SHE HAS NOT WORN PROSTHETIC LEG FOR A COUPLE OF YEARS.     Vision Baseline Vision/History: Wears glasses (HX OF CATARACT SX) Wears Glasses:  (PATIENT STATES SHE NEEDS TO SEE DOCTOR.) Patient Visual Report: No change from baseline       Perception     Praxis      Pertinent Vitals/Pain Pain Assessment: No/denies pain Faces Pain Scale: Hurts little more Pain Location: L posterior lower leg Pain Descriptors / Indicators: Sore;Tender Pain Intervention(s): Limited activity within patient's tolerance;Monitored during session;Repositioned     Hand Dominance Left   Extremity/Trunk Assessment Upper Extremity Assessment Upper Extremity Assessment:  (R SHLD HAS HARDWARE THAT HAS SHIFTED  OUT OF PLACE AND SHE HAS DECREASED ROM. PATIENT R SHLD FLEX 50 AND ABD 70.)   Lower Extremity Assessment Lower Extremity Assessment: Defer to PT evaluation       Communication Communication Communication: No difficulties   Cognition  Arousal/Alertness: Awake/alert Behavior During Therapy: WFL for tasks assessed/performed Overall Cognitive Status: Impaired/Different from baseline Area of Impairment: Memory (FAMILY STATES HER MEMORY WAS IMPAIRED BUT IT IS GETTING WORSE.)                                   General Comments  Pt reporting plan is to now go to SNF in order to get stronger before going to ALF.    Exercises     Shoulder Instructions      Home Living Family/patient expects to be discharged to:: Skilled nursing facility                             Home Equipment: Wheelchair - manual;Bedside commode   Additional Comments: LIVES IN ALF AND WANTS ST SNF FOR REHAB.      Prior Functioning/Environment Level of Independence: Needs assistance  Gait / Transfers Assistance Needed: w/c level ADL's / Homemaking Assistance Needed: assist with ADL's at ALF            OT Problem List: Decreased activity tolerance;Impaired balance (sitting and/or standing);Decreased knowledge of use of DME or AE;Decreased safety awareness      OT Treatment/Interventions: Self-care/ADL training;DME and/or AE instruction;Therapeutic activities;Patient/family education    OT Goals(Current goals can be found in the care plan section) Acute Rehab OT Goals Patient Stated Goal: TO BE BETTER OT Goal Formulation: With patient Time For Goal Achievement: 04/05/21 Potential to Achieve Goals: Good ADL Goals Pt Will Perform Upper Body Bathing: with supervision;sitting Pt Will Perform Lower Body Bathing: with min assist;sit to/from stand Pt Will Perform Upper Body Dressing: with set-up;sitting Pt Will Perform Lower Body Dressing: with set-up;with supervision;sit to/from stand Pt Will Transfer to Toilet: with supervision;bedside commode Pt Will Perform Toileting - Clothing Manipulation and hygiene: with supervision;sit to/from stand  OT Frequency: Min 2X/week   Barriers to D/C:  (PATIENT WANTS TO BE MORE I  PRIOR TO DC TO ALF.)          Co-evaluation              AM-PAC OT "6 Clicks" Daily Activity     Outcome Measure Help from another person eating meals?: None Help from another person taking care of personal grooming?: A Little Help from another person toileting, which includes using toliet, bedpan, or urinal?: A Lot Help from another person bathing (including washing, rinsing, drying)?: A Lot Help from another person to put on and taking off regular upper body clothing?: A Little Help from another person to put on and taking off regular lower body clothing?: Total 6 Click Score: 15   End of Session Equipment Utilized During Treatment: Surveyor, mining Communication:  (OK THERAPY)  Activity Tolerance: Patient tolerated treatment well Patient left: in chair;with call bell/phone within reach;with chair alarm set;with family/visitor present  OT Visit Diagnosis: Unsteadiness on feet (R26.81);Repeated falls (R29.6);History of falling (Z91.81)                Time: 6010-9323 OT Time Calculation (min): 29 min Charges:  OT General Charges $OT Visit: 1 Visit OT Evaluation $OT Eval Moderate Complexity: 1 Mod  [name] OT/L   [place] 03/22/2021, 1:05 PM

## 2021-03-22 NOTE — Progress Notes (Signed)
[facility]                                                                              Patient Demographics  [name], is a 87 y.o. female, DOB - 09/29/1931, NTI:[id number]  Admit date - 03/19/2021   Admitting Physician Vianne Bulls, MD  Outpatient Primary MD for the patient is Jani Gravel, MD  Outpatient specialists:   LOS - 3  days   Medical records reviewed and are as summarized below:    Chief Complaint  Patient presents with  . Shortness of Breath       Brief summary   Patient is an 87 year old female with history of COPD, CAD, complete heart block with pacer, dementia, CKD stage IIIa, hypertrophic cardiomyopathy status post septal myotomy, right AKA, chronic diastolic CHF presented with shortness of breath.  Patient had worsening shortness of breath over the past several days, waking her from her sleep multiple times over the past couple of nights due to restlessness, orthopnea.  EMS was called out to her ALF, no significant improvement with breathing treatment. In ED, O2 sats mid 90s on 2 L, chest x-ray noted cardiomegaly.  CTA negative for PE, showed cardiomegaly with small bilateral pleural effusions, scattered groundglass opacities concerning for early edema.  Creatinine 1.1, BNP 1445.  Patient was given Lasix in ED and admitted for further work-up.  PT recommended skilled nursing facility   Assessment & Plan    Principal Problem: Acute on chronic diastolic CHF (congestive heart failure) ([place]), acute respiratory failure with hypoxia -Patient presented with progressive shortness of breath, orthopnea, PND, elevated BNP -Patient was placed on IV Lasix, negative balance of 2.9 L, weight 105.8 on admission-> 100.09 -Continue strict I's and O's and daily weights. -Monitor renal function, creatinine slightly trended up to 1.0, Lasix transitioned to 20 mg oral daily -2D echo showed EF of 50 to 55%, severe concentric LVH  Atrial fibrillation -Rate  controlled, continue Eliquis, CHADS-VASc at least 4   CKD stage IIIa -Baseline creatinine ~1 -Creatinine stable, slightly trended up to 1.0 today due to diuresis  Hypothyroidism -Continue Synthroid, check TSH in a.m.  History of COPD -Currently no wheezing, stable, continue albuterol as needed  History of right AKA -Currently no acute issues   Generalized debility, underweight Estimated body mass index is 17.73 kg/m as calculated from the following:   Height as of this encounter: 5\' 3"  (1.6 m).   Weight as of this encounter: 45.4 kg. PT recommended skilled nursing facility  Code Status: Full CODE STATUS DVT Prophylaxis:  apixaban (ELIQUIS) tablet 2.5 mg Start: 03/19/21 2315 apixaban (ELIQUIS) tablet 2.5 mg   Level of Care: Level of care: Telemetry Cardiac Family Communication: Discussed all imaging results, lab results, explained to the patient    Disposition Plan:     Status is: Inpatient  Remains inpatient appropriate because:Inpatient level of care appropriate due to severity of illness   Dispo: The patient is from: ALF              Anticipated d/c is to: SNF  Patient currently is not medically stable to d/c.  Transition to oral Lasix today, PT recommending skilled nursing facility   Difficult to place patient No      Time Spent in minutes   35 minutes  Procedures:  2D echo  Consultants:   None  Antimicrobials:   Anti-infectives (From admission, onward)   None          Medications  Scheduled Meds: . apixaban  2.5 mg Oral BID  . docusate sodium  50 mg Oral Daily  . furosemide  20 mg Oral Daily  . gabapentin  200 mg Oral BID  . levothyroxine  25 mcg Oral QAC breakfast   Continuous Infusions: . sodium chloride     PRN Meds:.sodium chloride, acetaminophen, albuterol, hydrOXYzine, ondansetron (ZOFRAN) IV, polyethylene glycol      Subjective:   [name] was seen and examined today.  No acute chest pain or shortness of  breath, having sore throat and pain on swallowing.  Patient denies dizziness, abdominal pain, N/V/D/C, new weakness, numbess, tingling.  No fevers.  Objective:   Vitals:   03/22/21 0035 03/22/21 0239 03/22/21 0339 03/22/21 0856  BP: (!) 144/84  (!) 146/72 124/76  Pulse: 83  68 67  Resp: 19  18   Temp: 97.8 F (36.6 C)  98 F (36.7 C) 97.8 F (36.6 C)  TempSrc: Oral  Oral Oral  SpO2: 92%  95% 94%  Weight:  45.4 kg    Height:        Intake/Output Summary (Last 24 hours) at 03/22/2021 1339 Last data filed at 03/22/2021 1300 Gross per 24 hour  Intake 500 ml  Output 1325 ml  Net -825 ml     Wt Readings from Last 3 Encounters:  03/22/21 45.4 kg  10/28/20 51.7 kg  06/04/20 52.2 kg     Exam  General: Alert and oriented x 3, NAD  Cardiovascular: S1 S2 auscultated,RRR  Respiratory: Decreased breath at the bases  Gastrointestinal: Soft, nontender, nondistended, + bowel sounds  Ext: right AKA, no edema L LE  Neuro: no neuro deficits  Musculoskeletal: No digital cyanosis, clubbing  Skin: No rashes  Psych: Normal affect and demeanor, alert and oriented x3    Data Reviewed:  I have personally reviewed following labs and imaging studies  Micro Results Recent Results (from the past 240 hour(s))  Resp Panel by RT-PCR (Flu A&B, Covid) Nasopharyngeal Swab     Status: None   Collection Time: 03/19/21  9:45 PM   Specimen: Nasopharyngeal Swab; Nasopharyngeal(NP) swabs in vial transport medium  Result Value Ref Range Status   SARS Coronavirus 2 by RT PCR NEGATIVE NEGATIVE Final    Comment: (NOTE) SARS-CoV-2 target nucleic acids are NOT DETECTED.  The SARS-CoV-2 RNA is generally detectable in upper respiratory specimens during the acute phase of infection. The lowest concentration of SARS-CoV-2 viral copies this assay can detect is 138 copies/mL. A negative result does not preclude SARS-Cov-2 infection and should not be used as the sole basis for treatment or other patient  management decisions. A negative result may occur with  improper specimen collection/handling, submission of specimen other than nasopharyngeal swab, presence of viral mutation(s) within the areas targeted by this assay, and inadequate number of viral copies(<138 copies/mL). A negative result must be combined with clinical observations, patient history, and epidemiological information. The expected result is Negative.  Fact Sheet for Patients:  EntrepreneurPulse.com.au  Fact Sheet for Healthcare Providers:  IncredibleEmployment.be  This test is no t yet approved  or cleared by the [place] and  has been authorized for detection and/or diagnosis of SARS-CoV-2 by FDA under an Emergency Use Authorization (EUA). This EUA will remain  in effect (meaning this test can be used) for the duration of the COVID-19 declaration under Section 564(b)(1) of the Act, 21 U.S.C.section 360bbb-3(b)(1), unless the authorization is terminated  or revoked sooner.       Influenza A by PCR NEGATIVE NEGATIVE Final   Influenza B by PCR NEGATIVE NEGATIVE Final    Comment: (NOTE) The Xpert Xpress SARS-CoV-2/FLU/RSV plus assay is intended as an aid in the diagnosis of influenza from Nasopharyngeal swab specimens and should not be used as a sole basis for treatment. Nasal washings and aspirates are unacceptable for Xpert Xpress SARS-CoV-2/FLU/RSV testing.  Fact Sheet for Patients: EntrepreneurPulse.com.au  Fact Sheet for Healthcare Providers: IncredibleEmployment.be  This test is not yet approved or cleared by the [place] FDA and has been authorized for detection and/or diagnosis of SARS-CoV-2 by FDA under an Emergency Use Authorization (EUA). This EUA will remain in effect (meaning this test can be used) for the duration of the COVID-19 declaration under Section 564(b)(1) of the Act, 21 U.S.C. section 360bbb-3(b)(1),  unless the authorization is terminated or revoked.  Performed at [facility], [place] [address], [place], [place] [address]     Radiology Reports DG Chest 2 View  Result Date: 03/19/2021 CLINICAL DATA:  Shortness of breath EXAM: CHEST - 2 VIEW COMPARISON:  10/02/2019 FINDINGS: Surgical hardware in the right shoulder and cervical spine. Rotated patient. Post sternotomy changes. Enlarged cardiomediastinal silhouette with aortic atherosclerosis. No focal opacity, pleural effusion, or pneumothorax. Kyphosis of the spine. Right-sided pacing device, grossly stable allowing for rotation. IMPRESSION: No active cardiopulmonary disease. Cardiomegaly. Electronically Signed   By: Donavan Foil M.D.   On: 03/19/2021 16:47   CT Angio Chest PE W/Cm &/Or Wo Cm  Result Date: 03/19/2021 CLINICAL DATA:  Positive D-dimer EXAM: CT ANGIOGRAPHY CHEST WITH CONTRAST TECHNIQUE: Multidetector CT imaging of the chest was performed using the standard protocol during bolus administration of intravenous contrast. Multiplanar CT image reconstructions and MIPs were obtained to evaluate the vascular anatomy. CONTRAST:  59mL OMNIPAQUE IOHEXOL 350 MG/ML SOLN COMPARISON:  None. FINDINGS: Cardiovascular: No filling defects in the pulmonary arteries to suggest pulmonary emboli. Cardiomegaly. Densely calcified mitral valve, aortic valve and aorta. Prior CABG. No aneurysm. Mediastinum/Nodes: No mediastinal, hilar, or axillary adenopathy. Trachea and esophagus are unremarkable. Thyroid unremarkable. Lungs/Pleura: Small bilateral pleural effusions. Scattered ground-glass opacities throughout the lungs could reflect early edema. Upper Abdomen: Imaging into the upper abdomen demonstrates no acute findings. Musculoskeletal: Chest wall soft tissues are unremarkable. No acute bony abnormality. Review of the MIP images confirms the above findings. IMPRESSION: No evidence of pulmonary embolus. Cardiomegaly. Scattered ground-glass opacities in the  lungs could reflect early edema. Small bilateral effusions. Aortic Atherosclerosis (ICD10-I70.0). Electronically Signed   By: Rolm Baptise M.D.   On: 03/19/2021 22:21   ECHOCARDIOGRAM COMPLETE  Result Date: 03/21/2021    ECHOCARDIOGRAM REPORT   Patient Name:   JMYA ULIANO Hueber Date of Exam: 03/21/2021 Medical Rec #:  [id number]     Height:       63.0 in Accession #:    [id number]    Weight:       105.8 lb Date of Birth:  13-Aug-1931     BSA:          1.475 m Patient Age:    69 years  BP:           136/86 mmHg Patient Gender: F             HR:           71 bpm. Exam Location:  Inpatient Procedure: 2D Echo, Cardiac Doppler and Color Doppler Indications:    CHF-Acute Diastolic  History:        Patient has prior history of Echocardiogram examinations, most                 recent 10/04/2019. CHF and Hypertrophic Cardiomyopathy, CAD,                 Pacemaker, COPD, Mitral Valve Disease, Arrythmias:Atrial                 Fibrillation; Signs/Symptoms:Shortness of Breath. Heart block.                 CKD. S/P septal myotomy.  Sonographer:    Clayton Lefort RDCS (AE) Referring Phys: [id number] TIMOTHY S OPYD  Sonographer Comments: Technically difficult study due to poor echo windows and no subcostal window. Image acquisition challenging due to patient body habitus. Patient in Fowler's position due to SOB. IMPRESSIONS  1. Left ventricular ejection fraction, by best estimation, is 50 to 55%. The left ventricle has low normal function and not well visualized in all walls. Left ventricular endocardial border not optimally defined to evaluate regional wall motion. There is severe concentric left ventricular hypertrophy. Left ventricular diastolic parameters are indeterminate. Phenotypical suggestive of hypertrophic cardiomyopathy without LVOT obstruction.  2. Right ventricular systolic function is low normal. The right ventricular size is mildly enlarged.  3. Left atrial size was severely dilated.  4. Right atrial size was severely  dilated.  5. The mitral valve is abnormal. Moderate to severe mitral valve regurgitation. Severe mitral annular calcification.  6. Tricuspid valve regurgitation is severe. Presence of diastolic tricuspid regurgitation may indicated part of the mechanism is related to device.  7. The aortic valve is tricuspid. There is moderate calcification of the aortic valve. Aortic valve regurgitation is not visualized. Mild aortic valve sclerosis is present, with no evidence of aortic valve stenosis. Comparison(s): A prior study was performed on 10/04/2019. Valvular heart disease is worse. FINDINGS  Left Ventricle: Left ventricular ejection fraction, by estimation, is 50 to 55%. The left ventricle has low normal function. Left ventricular endocardial border not optimally defined to evaluate regional wall motion. The left ventricular internal cavity  size was small. There is severe concentric left ventricular hypertrophy. Left ventricular diastolic parameters are indeterminate. Right Ventricle: The right ventricular size is mildly enlarged. No increase in right ventricular wall thickness. Right ventricular systolic function is low normal. Left Atrium: Left atrial size was severely dilated. Right Atrium: Right atrial size was severely dilated. Pericardium: There is no evidence of pericardial effusion. Mitral Valve: The mitral valve is abnormal. There is severe thickening of the mitral valve leaflet(s). There is severe calcification of the mitral valve leaflet(s). Severe mitral annular calcification. Moderate to severe mitral valve regurgitation. MV peak gradient, 6.9 mmHg. The mean mitral valve gradient is 2.0 mmHg. Tricuspid Valve: The tricuspid valve is normal in structure. Tricuspid valve regurgitation is severe. Aortic Valve: The aortic valve is tricuspid. There is moderate calcification of the aortic valve. Aortic valve regurgitation is not visualized. Mild aortic valve sclerosis is present, with no evidence of aortic valve  stenosis. Aortic valve mean gradient measures 3.0 mmHg. Aortic valve peak gradient measures 6.2  mmHg. Aortic valve area, by VTI measures 1.36 cm. Pulmonic Valve: The pulmonic valve was grossly normal. Pulmonic valve regurgitation is mild to moderate. No evidence of pulmonic stenosis. Aorta: The aortic root and ascending aorta are structurally normal, with no evidence of dilitation. IAS/Shunts: The atrial septum is grossly normal. Additional Comments: A device lead is visualized.  LEFT VENTRICLE PLAX 2D LVIDd:         3.50 cm LVIDs:         2.60 cm LV PW:         2.20 cm LV IVS:        2.30 cm LVOT diam:     1.70 cm LV SV:         30 LV SV Index:   20 LVOT Area:     2.27 cm  LV Volumes (MOD) LV vol d, MOD A2C: 55.4 ml LV vol d, MOD A4C: 51.0 ml LV vol s, MOD A2C: 37.3 ml LV vol s, MOD A4C: 32.1 ml LV SV MOD A2C:     18.1 ml LV SV MOD A4C:     51.0 ml LV SV MOD BP:      22.3 ml RIGHT VENTRICLE RV Basal diam:  4.40 cm RV Mid diam:    3.00 cm RV S prime:     6.24 cm/s TAPSE (M-mode): 1.2 cm LEFT ATRIUM              Index        RIGHT ATRIUM           Index LA diam:        5.10 cm  3.46 cm/m   RA Area:     29.40 cm LA Vol (A2C):   172.0 ml 116.59 ml/m RA Volume:   106.00 ml 71.85 ml/m LA Vol (A4C):   153.0 ml 103.71 ml/m LA Biplane Vol: 162.0 ml 109.81 ml/m  AORTIC VALVE AV Area (Vmax):    1.38 cm AV Area (Vmean):   1.42 cm AV Area (VTI):     1.36 cm AV Vmax:           125.00 cm/s AV Vmean:          75.900 cm/s AV VTI:            0.218 m AV Peak Grad:      6.2 mmHg AV Mean Grad:      3.0 mmHg LVOT Vmax:         76.00 cm/s LVOT Vmean:        47.600 cm/s LVOT VTI:          0.131 m LVOT/AV VTI ratio: 0.60  AORTA Ao Root diam: 3.30 cm Ao Asc diam:  3.40 cm MITRAL VALVE                 TRICUSPID VALVE MV Area (PHT): 5.66 cm      TR Peak grad:   56.2 mmHg MV Area VTI:   1.07 cm      TR Vmax:        375.00 cm/s MV Peak grad:  6.9 mmHg MV Mean grad:  2.0 mmHg      SHUNTS MV Vmax:       1.31 m/s      Systemic VTI:   0.13 m MV Vmean:      57.4 cm/s     Systemic Diam: 1.70 cm MV Decel Time: 134 msec MR Peak grad:    109.8 mmHg MR Mean grad:  74.0 mmHg MR Vmax:         524.00 cm/s MR Vmean:        402.0 cm/s MR PISA:         4.02 cm MR PISA Eff ROA: 31 mm MR PISA Radius:  0.80 cm MV E velocity: 133.00 cm/s MV A velocity: 46.50 cm/s MV E/A ratio:  2.86 Rudean Haskell MD Electronically signed by Rudean Haskell MD Signature Date/Time: 03/21/2021/2:12:36 PM    Final     Lab Data:  CBC: Recent Labs  Lab 03/19/21 1704 03/20/21 0349  WBC 8.9 9.4  NEUTROABS 6.3  --   HGB 12.1 12.0  HCT 39.1 37.6  MCV 100.5* 96.2  PLT 256 161   Basic Metabolic Panel: Recent Labs  Lab 03/19/21 1704 03/20/21 0349 03/21/21 0317 03/22/21 0617  NA 138 137 138 141  K 4.2 3.7 3.5 4.1  CL 104 99 103 101  CO2 22 25 26 30   GLUCOSE 87 80 87 94  BUN 26* 22 20 20   CREATININE 1.15* 1.07* 0.98 1.03*  CALCIUM 9.1 8.8* 8.9 9.6   GFR: Estimated Creatinine Clearance: 27.6 mL/min (A) (by C-G formula based on SCr of 1.03 mg/dL (H)). Liver Function Tests: No results for input(s): AST, ALT, ALKPHOS, BILITOT, PROT, ALBUMIN in the last 168 hours. No results for input(s): LIPASE, AMYLASE in the last 168 hours. No results for input(s): AMMONIA in the last 168 hours. Coagulation Profile: No results for input(s): INR, PROTIME in the last 168 hours. Cardiac Enzymes: No results for input(s): CKTOTAL, CKMB, CKMBINDEX, TROPONINI in the last 168 hours. BNP (last 3 results) No results for input(s): PROBNP in the last 8760 hours. HbA1C: No results for input(s): HGBA1C in the last 72 hours. CBG: No results for input(s): GLUCAP in the last 168 hours. Lipid Profile: No results for input(s): CHOL, HDL, LDLCALC, TRIG, CHOLHDL, LDLDIRECT in the last 72 hours. Thyroid Function Tests: No results for input(s): TSH, T4TOTAL, FREET4, T3FREE, THYROIDAB in the last 72 hours. Anemia Panel: No results for input(s): VITAMINB12, FOLATE,  FERRITIN, TIBC, IRON, RETICCTPCT in the last 72 hours. Urine analysis:    Component Value Date/Time   COLORURINE YELLOW 10/02/2019 1202   APPEARANCEUR CLEAR 10/02/2019 1202   LABSPEC 1.012 10/02/2019 1202   PHURINE 7.0 10/02/2019 1202   GLUCOSEU NEGATIVE 10/02/2019 1202   HGBUR NEGATIVE 10/02/2019 1202   BILIRUBINUR negative 05/21/2020 1230   KETONESUR negative 05/21/2020 1230   KETONESUR NEGATIVE 10/02/2019 1202   PROTEINUR negative 05/21/2020 1230   PROTEINUR 100 (A) 10/02/2019 1202   UROBILINOGEN 0.2 05/21/2020 1230   UROBILINOGEN 1.0 05/18/2015 0024   NITRITE Negative 05/21/2020 1230   NITRITE NEGATIVE 10/02/2019 1202   LEUKOCYTESUR Trace (A) 05/21/2020 1230   LEUKOCYTESUR MODERATE (A) 10/02/2019 1202     Edyn Qazi M.D. [facility] 03/22/2021, 1:39 PM  Available via Epic secure chat 7am-7pm After 7 pm, please refer to night coverage provider listed on amion.

## 2021-03-22 NOTE — Progress Notes (Signed)
Physical Therapy Treatment Patient Details Name: [name] MRN: [id number] DOB: 1931-10-06 Today's Date: 03/22/2021    History of Present Illness [name] is a 87 y.o. female with medical history significant for COPD, coronary artery disease, complete heart block with pacer, dementia, chronic kidney disease stage IIIa, hypertrophic cardiomyopathy status post septal myotomy, right AKA, and chronic diastolic CHF, admitted with shortness of breath due to CHF.    PT Comments    Pt progressing towards goals. Continues to present with unsteadiness during transfers and requiring min to mod A for stability. Per pt, she would like to go to SNF to get stronger before going to ALF. Given deficits, feel this is appropriate plan. D/c recommendations update. Will continue to follow acutely.     Follow Up Recommendations  SNF     Equipment Recommendations  None recommended by PT    Recommendations for Other Services       Precautions / Restrictions Precautions Precautions: Fall Precaution Comments: R AKA Restrictions Weight Bearing Restrictions: No    Mobility  Bed Mobility Overal bed mobility: Needs Assistance Bed Mobility: Supine to Sit     Supine to sit: Min assist     General bed mobility comments: Min A for trunk elevation. Increased time required.    Transfers Overall transfer level: Needs assistance Equipment used: Rolling walker (2 wheeled) Transfers: Sit to/from [facility] Sit to Stand: Min assist Stand pivot transfers: Min assist;Mod assist       General transfer comment: Difficulty with standing initially. Min A. Pt unable to perform hop steps and performed pivotal steps. Pt with posterior lean. Was able to perform stand pivot transfer X2.  Ambulation/Gait                 Stairs             Wheelchair Mobility    Modified Rankin (Stroke Patients Only)       Balance Overall balance assessment: Needs  assistance Sitting-balance support: Feet supported Sitting balance-Leahy Scale: Good     Standing balance support: Bilateral upper extremity supported Standing balance-Leahy Scale: Poor Standing balance comment: Reliant on BUE support and external support                            Cognition Arousal/Alertness: Awake/alert Behavior During Therapy: WFL for tasks assessed/performed Overall Cognitive Status: No family/caregiver present to determine baseline cognitive functioning                                        Exercises      General Comments General comments (skin integrity, edema, etc.): Pt reporting plan is to now go to SNF in order to get stronger before going to ALF.      Pertinent Vitals/Pain Pain Assessment: Faces Faces Pain Scale: Hurts little more Pain Location: L posterior lower leg Pain Descriptors / Indicators: Sore;Tender Pain Intervention(s): Limited activity within patient's tolerance;Monitored during session;Repositioned    Home Living                      Prior Function            PT Goals (current goals can now be found in the care plan section) Acute Rehab PT Goals Patient Stated Goal: to get stronger PT Goal Formulation: With patient Time For Goal  Achievement: 04/03/21 Potential to Achieve Goals: Good Progress towards PT goals: Progressing toward goals    Frequency    Min 2X/week      PT Plan Discharge plan needs to be updated    Co-evaluation              AM-PAC PT "6 Clicks" Mobility   Outcome Measure  Help needed turning from your back to your side while in a flat bed without using bedrails?: A Little Help needed moving from lying on your back to sitting on the side of a flat bed without using bedrails?: A Little Help needed moving to and from a bed to a chair (including a wheelchair)?: A Lot Help needed standing up from a chair using your arms (e.g., wheelchair or bedside chair)?: A  Little Help needed to walk in hospital room?: Total Help needed climbing 3-5 steps with a railing? : Total 6 Click Score: 13    End of Session Equipment Utilized During Treatment: Gait belt Activity Tolerance: Patient tolerated treatment well Patient left: in chair;with call bell/phone within reach;with chair alarm set Nurse Communication: Mobility status PT Visit Diagnosis: Muscle weakness (generalized) (M62.81);Other abnormalities of gait and mobility (R26.89)     Time: 2902-1115 PT Time Calculation (min) (ACUTE ONLY): 13 min  Charges:  $Therapeutic Activity: 8-22 mins                     Lou Miner, DPT  Acute Rehabilitation Services  Pager: [phone] Office: [phone]    Rudean Hitt 03/22/2021, 11:42 AM

## 2021-03-23 LAB — BASIC METABOLIC PANEL
Anion gap: 11 (ref 5–15)
BUN: 23 mg/dL (ref 8–23)
CO2: 24 mmol/L (ref 22–32)
Calcium: 9 mg/dL (ref 8.9–10.3)
Chloride: 103 mmol/L (ref 98–111)
Creatinine, Ser: 1.05 mg/dL — ABNORMAL HIGH (ref 0.44–1.00)
GFR, Estimated: 51 mL/min — ABNORMAL LOW (ref >60)
Glucose, Bld: 103 mg/dL — ABNORMAL HIGH (ref 70–99)
Potassium: 4 mmol/L (ref 3.5–5.1)
Sodium: 138 mmol/L (ref 135–145)

## 2021-03-23 LAB — TSH: TSH: 3.65 u[IU]/mL (ref 0.350–4.500)

## 2021-03-23 LAB — T4, FREE: Free T4: 1.23 ng/dL — ABNORMAL HIGH (ref 0.61–1.12)

## 2021-03-23 NOTE — Progress Notes (Signed)
[facility]                                                                              Patient Demographics  [name], is a 87 y.o. female, DOB - [birth year], KHT:[id number]  Admit date - 03/19/2021   Admitting Physician Vianne Bulls, MD  Outpatient Primary MD for the patient is Jani Gravel, MD  Outpatient specialists:   LOS - 4  days   Medical records reviewed and are as summarized below:    Chief Complaint  Patient presents with  . Shortness of Breath       Brief summary   Patient is an 87 year old female with history of COPD, CAD, complete heart block with pacer, dementia, CKD stage IIIa, hypertrophic cardiomyopathy status post septal myotomy, right AKA, chronic diastolic CHF presented with shortness of breath.  Patient had worsening shortness of breath over the past several days, waking her from her sleep multiple times over the past couple of nights due to restlessness, orthopnea.  EMS was called out to her ALF, no significant improvement with breathing treatment. In ED, O2 sats mid 90s on 2 L, chest x-ray noted cardiomegaly.  CTA negative for PE, showed cardiomegaly with small bilateral pleural effusions, scattered groundglass opacities concerning for early edema.  Creatinine 1.1, BNP 1445.  Patient was given Lasix in ED and admitted for further work-up.  PT recommended skilled nursing facility   Assessment & Plan    Principal Problem: Acute on chronic diastolic CHF (congestive heart failure) ([place]), acute respiratory failure with hypoxia -Patient presented with progressive shortness of breath, orthopnea, PND, elevated BNP -Patient was placed on IV Lasix, now transitioned to oral Lasix.  Negative balance of 3 L.  weight 105.8 on admission-> 104  -Continue strict I's and O's and daily weights. -Creatinine stable, 1.05.  Continue Lasix 20 mg oral daily.  -2D echo showed EF of 50 to 55%, severe concentric LVH  Atrial fibrillation -Currently rate  controlled, continue Eliquis.   - CHADS-VASc at least 4   CKD stage IIIa -Baseline creatinine ~1 -Creatinine currently stable, at baseline  Hypothyroidism -TSH 3.6, continue Synthroid  History of COPD -Currently no wheezing, stable, continue albuterol as needed  History of right AKA -Currently no acute issues   Generalized debility, underweight Estimated body mass index is 18.43 kg/m as calculated from the following:   Height as of this encounter: 5\' 3"  (1.6 m).   Weight as of this encounter: 47.2 kg. PT recommended skilled nursing facility  Code Status: Full CODE STATUS DVT Prophylaxis:  apixaban (ELIQUIS) tablet 2.5 mg Start: 03/19/21 2315 apixaban (ELIQUIS) tablet 2.5 mg   Level of Care: Level of care: Telemetry Cardiac Family Communication: Discussed all imaging results, lab results, explained to the patient's niece, Darrielle Pflieger on phone     Disposition Plan:     Status is: Inpatient  Remains inpatient appropriate because:Inpatient level of care appropriate due to severity of illness   Dispo: The patient is from: ALF              Anticipated d/c is to: SNF  Patient currently is medically stable to d/c.  Plan for DC to SNF in a.m., Covid test ordered   Difficult to place patient No      Time Spent in minutes   25 minutes  Procedures:  2D echo  Consultants:   None  Antimicrobials:   Anti-infectives (From admission, onward)   None         Medications  Scheduled Meds: . apixaban  2.5 mg Oral BID  . docusate sodium  50 mg Oral Daily  . furosemide  20 mg Oral Daily  . gabapentin  200 mg Oral BID  . levothyroxine  25 mcg Oral QAC breakfast   Continuous Infusions: . sodium chloride     PRN Meds:.sodium chloride, acetaminophen, albuterol, hydrOXYzine, ondansetron (ZOFRAN) IV, polyethylene glycol      Subjective:   [name] was seen and examined today.  Currently no acute complaints.  No shortness of breath.  Feeling close  to her baseline.  No fevers.  Patient denies dizziness, abdominal pain, N/V/D/C, new weakness, numbess, tingling.  No fevers.  Objective:   Vitals:   03/23/21 0140 03/23/21 0416 03/23/21 0858 03/23/21 1136  BP:  (!) 146/68 (!) 141/87 135/74  Pulse:  65 68 71  Resp:  19 19 18   Temp:  97.9 F (36.6 C) (!) 97.4 F (36.3 C) 97.7 F (36.5 C)  TempSrc:  Oral Oral Oral  SpO2:  96% 99% 95%  Weight: 47.2 kg     Height:        Intake/Output Summary (Last 24 hours) at 03/23/2021 1429 Last data filed at 03/23/2021 0857 Gross per 24 hour  Intake 270 ml  Output 400 ml  Net -130 ml     Wt Readings from Last 3 Encounters:  03/23/21 47.2 kg  10/28/20 51.7 kg  06/04/20 52.2 kg   Physical Exam  General: Alert and oriented x 3, NAD  Cardiovascular: S1 S2 clear, RRR.  Respiratory: CTAB, no wheezing, rales or rhonchi  Gastrointestinal: Soft, nontender, nondistended, NBS  Ext: right AKA, no pedal edema L LE  Neuro: no new deficits  Musculoskeletal: No cyanosis, clubbing  Skin: No rashes  Psych: Normal affect and demeanor, alert and oriented x3      Data Reviewed:  I have personally reviewed following labs and imaging studies  Micro Results Recent Results (from the past 240 hour(s))  Resp Panel by RT-PCR (Flu A&B, Covid) Nasopharyngeal Swab     Status: None   Collection Time: 03/19/21  9:45 PM   Specimen: Nasopharyngeal Swab; Nasopharyngeal(NP) swabs in vial transport medium  Result Value Ref Range Status   SARS Coronavirus 2 by RT PCR NEGATIVE NEGATIVE Final    Comment: (NOTE) SARS-CoV-2 target nucleic acids are NOT DETECTED.  The SARS-CoV-2 RNA is generally detectable in upper respiratory specimens during the acute phase of infection. The lowest concentration of SARS-CoV-2 viral copies this assay can detect is 138 copies/mL. A negative result does not preclude SARS-Cov-2 infection and should not be used as the sole basis for treatment or other patient management  decisions. A negative result may occur with  improper specimen collection/handling, submission of specimen other than nasopharyngeal swab, presence of viral mutation(s) within the areas targeted by this assay, and inadequate number of viral copies(<138 copies/mL). A negative result must be combined with clinical observations, patient history, and epidemiological information. The expected result is Negative.  Fact Sheet for Patients:  EntrepreneurPulse.com.au  Fact Sheet for Healthcare Providers:  IncredibleEmployment.be  This test is no  t yet approved or cleared by the [place] and  has been authorized for detection and/or diagnosis of SARS-CoV-2 by FDA under an Emergency Use Authorization (EUA). This EUA will remain  in effect (meaning this test can be used) for the duration of the COVID-19 declaration under Section 564(b)(1) of the Act, 21 U.S.C.section 360bbb-3(b)(1), unless the authorization is terminated  or revoked sooner.       Influenza A by PCR NEGATIVE NEGATIVE Final   Influenza B by PCR NEGATIVE NEGATIVE Final    Comment: (NOTE) The Xpert Xpress SARS-CoV-2/FLU/RSV plus assay is intended as an aid in the diagnosis of influenza from Nasopharyngeal swab specimens and should not be used as a sole basis for treatment. Nasal washings and aspirates are unacceptable for Xpert Xpress SARS-CoV-2/FLU/RSV testing.  Fact Sheet for Patients: EntrepreneurPulse.com.au  Fact Sheet for Healthcare Providers: IncredibleEmployment.be  This test is not yet approved or cleared by the [place] FDA and has been authorized for detection and/or diagnosis of SARS-CoV-2 by FDA under an Emergency Use Authorization (EUA). This EUA will remain in effect (meaning this test can be used) for the duration of the COVID-19 declaration under Section 564(b)(1) of the Act, 21 U.S.C. section 360bbb-3(b)(1), unless the  authorization is terminated or revoked.  Performed at [facility], [place] [address] [place], [place] [address]     Radiology Reports DG Chest 2 View  Result Date: 03/19/2021 CLINICAL DATA:  Shortness of breath EXAM: CHEST - 2 VIEW COMPARISON:  10/02/2019 FINDINGS: Surgical hardware in the right shoulder and cervical spine. Rotated patient. Post sternotomy changes. Enlarged cardiomediastinal silhouette with aortic atherosclerosis. No focal opacity, pleural effusion, or pneumothorax. Kyphosis of the spine. Right-sided pacing device, grossly stable allowing for rotation. IMPRESSION: No active cardiopulmonary disease. Cardiomegaly. Electronically Signed   By: Donavan Foil M.D.   On: 03/19/2021 16:47   CT Angio Chest PE W/Cm &/Or Wo Cm  Result Date: 03/19/2021 CLINICAL DATA:  Positive D-dimer EXAM: CT ANGIOGRAPHY CHEST WITH CONTRAST TECHNIQUE: Multidetector CT imaging of the chest was performed using the standard protocol during bolus administration of intravenous contrast. Multiplanar CT image reconstructions and MIPs were obtained to evaluate the vascular anatomy. CONTRAST:  46mL OMNIPAQUE IOHEXOL 350 MG/ML SOLN COMPARISON:  None. FINDINGS: Cardiovascular: No filling defects in the pulmonary arteries to suggest pulmonary emboli. Cardiomegaly. Densely calcified mitral valve, aortic valve and aorta. Prior CABG. No aneurysm. Mediastinum/Nodes: No mediastinal, hilar, or axillary adenopathy. Trachea and esophagus are unremarkable. Thyroid unremarkable. Lungs/Pleura: Small bilateral pleural effusions. Scattered ground-glass opacities throughout the lungs could reflect early edema. Upper Abdomen: Imaging into the upper abdomen demonstrates no acute findings. Musculoskeletal: Chest wall soft tissues are unremarkable. No acute bony abnormality. Review of the MIP images confirms the above findings. IMPRESSION: No evidence of pulmonary embolus. Cardiomegaly. Scattered ground-glass opacities in the lungs could  reflect early edema. Small bilateral effusions. Aortic Atherosclerosis (ICD10-I70.0). Electronically Signed   By: Rolm Baptise M.D.   On: 03/19/2021 22:21   ECHOCARDIOGRAM COMPLETE  Result Date: 03/21/2021    ECHOCARDIOGRAM REPORT   Patient Name:   ALETHA ALLEBACH Drudge Date of Exam: 03/21/2021 Medical Rec #:  [id number]     Height:       63.0 in Accession #:    [id number]    Weight:       105.8 lb Date of Birth:  Oct 18, 1931     BSA:          1.475 m Patient Age:  89 years      BP:           136/86 mmHg Patient Gender: F             HR:           71 bpm. Exam Location:  Inpatient Procedure: 2D Echo, Cardiac Doppler and Color Doppler Indications:    CHF-Acute Diastolic  History:        Patient has prior history of Echocardiogram examinations, most                 recent 10/04/2019. CHF and Hypertrophic Cardiomyopathy, CAD,                 Pacemaker, COPD, Mitral Valve Disease, Arrythmias:Atrial                 Fibrillation; Signs/Symptoms:Shortness of Breath. Heart block.                 CKD. S/P septal myotomy.  Sonographer:    Clayton Lefort RDCS (AE) Referring Phys: [id number] TIMOTHY S OPYD  Sonographer Comments: Technically difficult study due to poor echo windows and no subcostal window. Image acquisition challenging due to patient body habitus. Patient in Fowler's position due to SOB. IMPRESSIONS  1. Left ventricular ejection fraction, by best estimation, is 50 to 55%. The left ventricle has low normal function and not well visualized in all walls. Left ventricular endocardial border not optimally defined to evaluate regional wall motion. There is severe concentric left ventricular hypertrophy. Left ventricular diastolic parameters are indeterminate. Phenotypical suggestive of hypertrophic cardiomyopathy without LVOT obstruction.  2. Right ventricular systolic function is low normal. The right ventricular size is mildly enlarged.  3. Left atrial size was severely dilated.  4. Right atrial size was severely dilated.  5. The  mitral valve is abnormal. Moderate to severe mitral valve regurgitation. Severe mitral annular calcification.  6. Tricuspid valve regurgitation is severe. Presence of diastolic tricuspid regurgitation may indicated part of the mechanism is related to device.  7. The aortic valve is tricuspid. There is moderate calcification of the aortic valve. Aortic valve regurgitation is not visualized. Mild aortic valve sclerosis is present, with no evidence of aortic valve stenosis. Comparison(s): A prior study was performed on 10/04/2019. Valvular heart disease is worse. FINDINGS  Left Ventricle: Left ventricular ejection fraction, by estimation, is 50 to 55%. The left ventricle has low normal function. Left ventricular endocardial border not optimally defined to evaluate regional wall motion. The left ventricular internal cavity  size was small. There is severe concentric left ventricular hypertrophy. Left ventricular diastolic parameters are indeterminate. Right Ventricle: The right ventricular size is mildly enlarged. No increase in right ventricular wall thickness. Right ventricular systolic function is low normal. Left Atrium: Left atrial size was severely dilated. Right Atrium: Right atrial size was severely dilated. Pericardium: There is no evidence of pericardial effusion. Mitral Valve: The mitral valve is abnormal. There is severe thickening of the mitral valve leaflet(s). There is severe calcification of the mitral valve leaflet(s). Severe mitral annular calcification. Moderate to severe mitral valve regurgitation. MV peak gradient, 6.9 mmHg. The mean mitral valve gradient is 2.0 mmHg. Tricuspid Valve: The tricuspid valve is normal in structure. Tricuspid valve regurgitation is severe. Aortic Valve: The aortic valve is tricuspid. There is moderate calcification of the aortic valve. Aortic valve regurgitation is not visualized. Mild aortic valve sclerosis is present, with no evidence of aortic valve stenosis. Aortic  valve mean gradient measures 3.0  mmHg. Aortic valve peak gradient measures 6.2 mmHg. Aortic valve area, by VTI measures 1.36 cm. Pulmonic Valve: The pulmonic valve was grossly normal. Pulmonic valve regurgitation is mild to moderate. No evidence of pulmonic stenosis. Aorta: The aortic root and ascending aorta are structurally normal, with no evidence of dilitation. IAS/Shunts: The atrial septum is grossly normal. Additional Comments: A device lead is visualized.  LEFT VENTRICLE PLAX 2D LVIDd:         3.50 cm LVIDs:         2.60 cm LV PW:         2.20 cm LV IVS:        2.30 cm LVOT diam:     1.70 cm LV SV:         30 LV SV Index:   20 LVOT Area:     2.27 cm  LV Volumes (MOD) LV vol d, MOD A2C: 55.4 ml LV vol d, MOD A4C: 51.0 ml LV vol s, MOD A2C: 37.3 ml LV vol s, MOD A4C: 32.1 ml LV SV MOD A2C:     18.1 ml LV SV MOD A4C:     51.0 ml LV SV MOD BP:      22.3 ml RIGHT VENTRICLE RV Basal diam:  4.40 cm RV Mid diam:    3.00 cm RV S prime:     6.24 cm/s TAPSE (M-mode): 1.2 cm LEFT ATRIUM              Index        RIGHT ATRIUM           Index LA diam:        5.10 cm  3.46 cm/m   RA Area:     29.40 cm LA Vol (A2C):   172.0 ml 116.59 ml/m RA Volume:   106.00 ml 71.85 ml/m LA Vol (A4C):   153.0 ml 103.71 ml/m LA Biplane Vol: 162.0 ml 109.81 ml/m  AORTIC VALVE AV Area (Vmax):    1.38 cm AV Area (Vmean):   1.42 cm AV Area (VTI):     1.36 cm AV Vmax:           125.00 cm/s AV Vmean:          75.900 cm/s AV VTI:            0.218 m AV Peak Grad:      6.2 mmHg AV Mean Grad:      3.0 mmHg LVOT Vmax:         76.00 cm/s LVOT Vmean:        47.600 cm/s LVOT VTI:          0.131 m LVOT/AV VTI ratio: 0.60  AORTA Ao Root diam: 3.30 cm Ao Asc diam:  3.40 cm MITRAL VALVE                 TRICUSPID VALVE MV Area (PHT): 5.66 cm      TR Peak grad:   56.2 mmHg MV Area VTI:   1.07 cm      TR Vmax:        375.00 cm/s MV Peak grad:  6.9 mmHg MV Mean grad:  2.0 mmHg      SHUNTS MV Vmax:       1.31 m/s      Systemic VTI:  0.13 m MV Vmean:       57.4 cm/s     Systemic Diam: 1.70 cm MV Decel Time: 134 msec MR Peak grad:  109.8 mmHg MR Mean grad:    74.0 mmHg MR Vmax:         524.00 cm/s MR Vmean:        402.0 cm/s MR PISA:         4.02 cm MR PISA Eff ROA: 31 mm MR PISA Radius:  0.80 cm MV E velocity: 133.00 cm/s MV A velocity: 46.50 cm/s MV E/A ratio:  2.86 Rudean Haskell MD Electronically signed by Rudean Haskell MD Signature Date/Time: 03/21/2021/2:12:36 PM    Final     Lab Data:  CBC: Recent Labs  Lab 03/19/21 1704 03/20/21 0349  WBC 8.9 9.4  NEUTROABS 6.3  --   HGB 12.1 12.0  HCT 39.1 37.6  MCV 100.5* 96.2  PLT 256 676   Basic Metabolic Panel: Recent Labs  Lab 03/19/21 1704 03/20/21 0349 03/21/21 0317 03/22/21 0617 03/23/21 0416  NA 138 137 138 141 138  K 4.2 3.7 3.5 4.1 4.0  CL 104 99 103 101 103  CO2 22 25 26 30 24   GLUCOSE 87 80 87 94 103*  BUN 26* 22 20 20 23   CREATININE 1.15* 1.07* 0.98 1.03* 1.05*  CALCIUM 9.1 8.8* 8.9 9.6 9.0   GFR: Estimated Creatinine Clearance: 28.1 mL/min (A) (by C-G formula based on SCr of 1.05 mg/dL (H)). Liver Function Tests: No results for input(s): AST, ALT, ALKPHOS, BILITOT, PROT, ALBUMIN in the last 168 hours. No results for input(s): LIPASE, AMYLASE in the last 168 hours. No results for input(s): AMMONIA in the last 168 hours. Coagulation Profile: No results for input(s): INR, PROTIME in the last 168 hours. Cardiac Enzymes: No results for input(s): CKTOTAL, CKMB, CKMBINDEX, TROPONINI in the last 168 hours. BNP (last 3 results) No results for input(s): PROBNP in the last 8760 hours. HbA1C: No results for input(s): HGBA1C in the last 72 hours. CBG: No results for input(s): GLUCAP in the last 168 hours. Lipid Profile: No results for input(s): CHOL, HDL, LDLCALC, TRIG, CHOLHDL, LDLDIRECT in the last 72 hours. Thyroid Function Tests: Recent Labs    03/23/21 0416  TSH 3.650  FREET4 1.23*   Anemia Panel: No results for input(s): VITAMINB12, FOLATE,  FERRITIN, TIBC, IRON, RETICCTPCT in the last 72 hours. Urine analysis:    Component Value Date/Time   COLORURINE YELLOW 10/02/2019 1202   APPEARANCEUR CLEAR 10/02/2019 1202   LABSPEC 1.012 10/02/2019 1202   PHURINE 7.0 10/02/2019 1202   GLUCOSEU NEGATIVE 10/02/2019 1202   HGBUR NEGATIVE 10/02/2019 1202   BILIRUBINUR negative 05/21/2020 1230   KETONESUR negative 05/21/2020 1230   KETONESUR NEGATIVE 10/02/2019 1202   PROTEINUR negative 05/21/2020 1230   PROTEINUR 100 (A) 10/02/2019 1202   UROBILINOGEN 0.2 05/21/2020 1230   UROBILINOGEN 1.0 05/18/2015 0024   NITRITE Negative 05/21/2020 1230   NITRITE NEGATIVE 10/02/2019 1202   LEUKOCYTESUR Trace (A) 05/21/2020 1230   LEUKOCYTESUR MODERATE (A) 10/02/2019 1202     Abbygail Willhoite M.D. [facility] 03/23/2021, 2:29 PM  Available via Epic secure chat 7am-7pm After 7 pm, please refer to night coverage provider listed on amion.

## 2021-03-23 NOTE — NC FL2 (Signed)
[place] LEVEL OF CARE SCREENING TOOL     IDENTIFICATION  Patient Name: [name] Birthdate: 15-Oct-1931 Sex: female Admission Date (Current Location): 03/19/2021  [facility] Number:  Herbalist and Address:  [facility], [place] [address], [place], [place] [address]      Provider Number: [id number]  Attending Physician Name and Address:  Mendel Corning, MD  Relative Name and Phone Number:  Jaslynne Dahan, [phone]    Current Level of Care: Hospital Recommended Level of Care: [place] Prior Approval Number:    Date Approved/Denied:   PASRR Number: [id number]  Discharge Plan: SNF    Current Diagnoses: Patient Active Problem List   Diagnosis Date Noted  . Acute on chronic diastolic CHF (congestive heart failure) ([place]) 03/19/2021  . Hypothyroidism 03/19/2021  . Encephalopathy 10/02/2019  . Elevated troponin 10/02/2019  . AMS (altered mental status) 10/02/2019  . Heart block   . Encounter for care of pacemaker 06/13/2019  . Right carotid bruit 02/18/2019  . Chronic kidney disease, stage 3a ([place]) 01/29/2019  . Hypoglycemia 01/29/2019  . CHF (congestive heart failure) ([place]) 01/28/2019  . Rt Olecranon fracture 04/07/2018  . Essential hypertension 04/07/2018  . Rt Hip Fracture ([place]) 04/06/2018  . Obstructive lung disease (generalized) ([place]) 10/04/2017  . Humerus fracture 06/09/2016  . Hx of right BKA ([place]) 06/09/2016  . Acute respiratory failure with hypoxia ([place]) 06/09/2016  . Malnutrition of moderate degree 06/09/2016  . Unilateral AKA ([place]) 09/23/2014  . Chronic atrial fibrillation ([place]) 08/12/2014  . Hypertension 01/17/2014  . Cardiac pacemaker in situ 01/17/2014  . Heart block AV complete ([place]) 07/03/2013  . Atrial fibrillation ([place]) 07/03/2013  . Breast cancer, left breast ([place]) 04/24/2013  . Other symptoms involving cardiovascular system 12/22/2010  . MITRAL REGURGITATION 06/17/2010   . HOCM (hypertrophic obstructive cardiomyopathy) ([place]) 06/17/2010  . History of heart block 06/17/2010  . HEMORRHOIDS 06/17/2010  . BRONCHITIS 06/17/2010  . ASTHMA 06/17/2010  . CYSTITIS 06/17/2010  . URINARY INCONTINENCE 06/17/2010  . Pacemaker Medtronic Adapta dual-chamber pacemaker 2006, generator change 11/27/2013, programmed to VVI  06/21/2005    Orientation RESPIRATION BLADDER Height & Weight     Self,Time  Normal Continent,External catheter Weight: 104 lb 0.9 oz (47.2 kg) Height:  5\' 3"  (160 cm)  BEHAVIORAL SYMPTOMS/MOOD NEUROLOGICAL BOWEL NUTRITION STATUS      Continent Diet (See DC summary)  AMBULATORY STATUS COMMUNICATION OF NEEDS Skin   Extensive Assist Verbally Skin abrasions (Skin Tear on L Tibial w/ gauze)                       Personal Care Assistance Level of Assistance  Bathing,Feeding,Dressing Bathing Assistance: Limited assistance Feeding assistance: Limited assistance Dressing Assistance: Limited assistance     Functional Limitations Info  Sight,Hearing,Speech Sight Info: Adequate Hearing Info: Adequate Speech Info: Adequate    SPECIAL CARE FACTORS FREQUENCY  PT (By licensed PT),OT (By licensed OT)     PT Frequency: 5x week OT Frequency: 5x week            Contractures Contractures Info: Not present    Additional Factors Info  Code Status,Allergies Code Status Info: Full Allergies Info: Codeine   Morphine And Related   Sulfa Antibiotics   Ceftaroline           Current Medications (03/23/2021):  This is the current hospital active medication list Current Facility-Administered Medications  Medication Dose Route Frequency Provider  Last Rate Last Admin  . 0.9 %  sodium chloride infusion  250 mL Intravenous PRN Opyd, Ilene Qua, MD      . acetaminophen (TYLENOL) tablet 650 mg  650 mg Oral Q4H PRN Vianne Bulls, MD   650 mg at 03/22/21 2146  . albuterol (VENTOLIN HFA) 108 (90 Base) MCG/ACT inhaler 2 puff  2 puff Inhalation Q4H PRN Opyd,  Ilene Qua, MD      . apixaban (ELIQUIS) tablet 2.5 mg  2.5 mg Oral BID Opyd, Ilene Qua, MD   2.5 mg at 03/23/21 0856  . docusate sodium (COLACE) capsule 50 mg  50 mg Oral Daily Opyd, Ilene Qua, MD   50 mg at 03/23/21 0856  . furosemide (LASIX) tablet 20 mg  20 mg Oral Daily Dana Allan I, MD   20 mg at 03/23/21 0856  . gabapentin (NEURONTIN) capsule 200 mg  200 mg Oral BID Opyd, Ilene Qua, MD   200 mg at 03/23/21 0856  . hydrOXYzine (ATARAX/VISTARIL) tablet 12.5 mg  12.5 mg Oral TID PRN Vianne Bulls, MD   12.5 mg at 03/23/21 0402  . levothyroxine (SYNTHROID) tablet 25 mcg  25 mcg Oral QAC breakfast Opyd, Ilene Qua, MD   25 mcg at 03/23/21 [phone]  . ondansetron (ZOFRAN) injection 4 mg  4 mg Intravenous Q6H PRN Opyd, Ilene Qua, MD      . polyethylene glycol (MIRALAX / GLYCOLAX) packet 17 g  17 g Oral Daily PRN Opyd, Ilene Qua, MD         Discharge Medications: Please see discharge summary for a list of discharge medications.  Relevant Imaging Results:  Relevant Lab Results:   Additional Information [id number]. Pfizer vaccine on 03/03/20, 03/24/20  Benard Halsted, LCSW

## 2021-03-23 NOTE — TOC Progression Note (Signed)
Transition of Care [facility]) - Progression Note    Patient Details  Name: [name] MRN: [id number] Date of Birth: July 31, 1931  [facility] of [facility] CM/SW Contact  Reece Agar, [place] Phone Number: 03/23/2021, 2:23 PM  Clinical Narrative:    1:45pm- CSW spoke with family (in room) they wanted to know pt dc date. CSW messaged MD bc there were no further barriers found, MD will put in orders and has already ordered covid test. Pt will be dc to [facility] tomorrow. CSW will follow up as needed.        Expected Discharge Plan and Services                                                 Social Determinants of Health (SDOH) Interventions    Readmission Risk Interventions No flowsheet data found.

## 2021-03-23 NOTE — TOC Initial Note (Signed)
Transition of Care [facility]) - Initial/Assessment Note    Patient Details  Name: [name] MRN: [id number] Date of Birth: 1931/04/01  [facility] CM/SW Contact:    Tresa Endo Phone Number: 03/23/2021, 3:04 PM  Clinical Narrative:                 CSW spoke with family in the room, family is agreeable with SNF but is wanting [facility] bc of familiarity. CSW will fax out pt to [facility] and other facilities for bed keeping in mind [facility] in priority to family.  Expected Discharge Plan: Skilled Nursing Facility Barriers to Discharge: Continued Medical Work up   Patient Goals and CMS Choice Patient states their goals for this hospitalization and ongoing recovery are:: Rehab CMS Medicare.gov Compare Post Acute Care list provided to:: Patient Choice offered to / list presented to : [place] (Relative)  Expected Discharge Plan and Services Expected Discharge Plan: [place] In-house Referral: Clinical Social Work   Post Acute Care Choice: [place] Living arrangements for the past 2 months: [place] Research officer, political party)                                      Prior Living Arrangements/Services Living arrangements for the past 2 months: [place] Research officer, political party) Lives with:: Self Patient language and need for interpreter reviewed:: No Do you feel safe going back to the place where you live?: Yes      Need for Family Participation in Patient Care: Yes (Comment) Care giver support system in place?: Yes (comment)   Criminal Activity/Legal Involvement Pertinent to Current Situation/Hospitalization: No - Comment as needed  Activities of Daily Living Home Assistive Devices/Equipment: Eyeglasses,Wheelchair ADL Screening (condition at time of admission) Patient's cognitive ability adequate to safely complete daily activities?: No Is the patient deaf or have difficulty hearing?: Yes Does the patient have difficulty  seeing, even when wearing glasses/contacts?: No Does the patient have difficulty concentrating, remembering, or making decisions?: Yes Patient able to express need for assistance with ADLs?: No Does the patient have difficulty dressing or bathing?: Yes Independently performs ADLs?: No Communication: Dependent Is this a change from baseline?: Pre-admission baseline Dressing (OT): Needs assistance Is this a change from baseline?: Pre-admission baseline Grooming: Needs assistance Is this a change from baseline?: Pre-admission baseline Feeding: Dependent Is this a change from baseline?: Pre-admission baseline Bathing: Needs assistance Is this a change from baseline?: Pre-admission baseline Toileting: Needs assistance Is this a change from baseline?: Pre-admission baseline In/Out Bed: Needs assistance Is this a change from baseline?: Pre-admission baseline Walks in Home: Needs assistance Is this a change from baseline?: Pre-admission baseline Does the patient have difficulty walking or climbing stairs?: Yes Weakness of Legs: Both (with right BKA) Weakness of Arms/Hands: Both  Permission Sought/Granted Permission sought to share information with : Family Supports Permission granted to share information with : Yes, Verbal Permission Granted  Share Information with NAME: Zylpha Poynor     Permission granted to share info w Relationship: Relative  Permission granted to share info w Contact Information: [phone]  Emotional Assessment Appearance:: Appears stated age   Affect (typically observed): Appropriate Orientation: : Oriented to Place,Oriented to Self Alcohol / Substance Use: Not Applicable Psych Involvement: No (comment)  Admission diagnosis:  Acute on chronic diastolic CHF (congestive heart failure) ([place]) [I50.33] Acute on chronic congestive heart failure, unspecified heart failure type ([place]) [  I50.9] Patient Active Problem List   Diagnosis Date Noted  . Acute on chronic  diastolic CHF (congestive heart failure) ([place]) 03/19/2021  . Hypothyroidism 03/19/2021  . Encephalopathy 10/02/2019  . Elevated troponin 10/02/2019  . AMS (altered mental status) 10/02/2019  . Heart block   . Encounter for care of pacemaker 06/13/2019  . Right carotid bruit 02/18/2019  . Chronic kidney disease, stage 3a ([place]) 01/29/2019  . Hypoglycemia 01/29/2019  . CHF (congestive heart failure) ([place]) 01/28/2019  . Rt Olecranon fracture 04/07/2018  . Essential hypertension 04/07/2018  . Rt Hip Fracture ([place]) 04/06/2018  . Obstructive lung disease (generalized) ([place]) 10/04/2017  . Humerus fracture 06/09/2016  . Hx of right BKA ([place]) 06/09/2016  . Acute respiratory failure with hypoxia ([place]) 06/09/2016  . Malnutrition of moderate degree 06/09/2016  . Unilateral AKA ([place]) 09/23/2014  . Chronic atrial fibrillation ([place]) 08/12/2014  . Hypertension 01/17/2014  . Cardiac pacemaker in situ 01/17/2014  . Heart block AV complete ([place]) 07/03/2013  . Atrial fibrillation ([place]) 07/03/2013  . Breast cancer, left breast ([place]) 04/24/2013  . Other symptoms involving cardiovascular system 12/22/2010  . MITRAL REGURGITATION 06/17/2010  . HOCM (hypertrophic obstructive cardiomyopathy) ([place]) 06/17/2010  . History of heart block 06/17/2010  . HEMORRHOIDS 06/17/2010  . BRONCHITIS 06/17/2010  . ASTHMA 06/17/2010  . CYSTITIS 06/17/2010  . URINARY INCONTINENCE 06/17/2010  . Pacemaker Medtronic Adapta dual-chamber pacemaker 2006, generator change 11/27/2013, programmed to VVI  06/21/2005   PCP:  Jani Gravel, MD Pharmacy:   [facility], [address] [place] [address] Phone: [phone] Fax: [phone]  [facility] [place], [place] [address] [place] [address] Phone: [phone] Fax: [phone]  [facility] [place], [address] [place] [address] Phone: [phone] Fax: [phone]     Social Determinants of Health (SDOH) Interventions    Readmission Risk Interventions No flowsheet data found.

## 2021-03-23 NOTE — Consult Note (Signed)
   [facility] CM Inpatient Consult   03/23/2021  [name] 1931-10-11 [id number]   [place] Organization [ACO] Patient: Medicare DCE   Patient screened for hospitalization and  to assess for disposition needs for potential [place] Management service  for post hospital transition.  Review of patient's medical record reveals patient is from [facility] and being recommended for a skilled nursing facility stay for rehab per PT/OT notes.  Plan:  Continue to follow progress and disposition to assess for post hospital care management needs.  If patient transitions to a [facility] affiliated facility this [place] will alert the [facility] RN.  For questions contact:   Natividad Brood, RN BSN [place] [facility] Liaison  [phone] business mobile phone Toll free office [phone]  Fax number: [phone] [email or web address]

## 2021-03-23 NOTE — Plan of Care (Signed)
  Problem: Cardiac: Goal: Ability to achieve and maintain adequate cardiopulmonary perfusion will improve Outcome: Progressing   

## 2021-03-24 DIAGNOSIS — I421 Obstructive hypertrophic cardiomyopathy: Secondary | ICD-10-CM | POA: Diagnosis not present

## 2021-03-24 DIAGNOSIS — I509 Heart failure, unspecified: Secondary | ICD-10-CM | POA: Diagnosis not present

## 2021-03-24 DIAGNOSIS — R011 Cardiac murmur, unspecified: Secondary | ICD-10-CM | POA: Diagnosis not present

## 2021-03-24 DIAGNOSIS — M6281 Muscle weakness (generalized): Secondary | ICD-10-CM | POA: Diagnosis not present

## 2021-03-24 DIAGNOSIS — R54 Age-related physical debility: Secondary | ICD-10-CM | POA: Diagnosis not present

## 2021-03-24 DIAGNOSIS — J309 Allergic rhinitis, unspecified: Secondary | ICD-10-CM | POA: Diagnosis not present

## 2021-03-24 DIAGNOSIS — Z7901 Long term (current) use of anticoagulants: Secondary | ICD-10-CM | POA: Diagnosis not present

## 2021-03-24 DIAGNOSIS — I482 Chronic atrial fibrillation, unspecified: Secondary | ICD-10-CM | POA: Diagnosis not present

## 2021-03-24 DIAGNOSIS — R2681 Unsteadiness on feet: Secondary | ICD-10-CM | POA: Diagnosis not present

## 2021-03-24 DIAGNOSIS — M2550 Pain in unspecified joint: Secondary | ICD-10-CM | POA: Diagnosis not present

## 2021-03-24 DIAGNOSIS — R278 Other lack of coordination: Secondary | ICD-10-CM | POA: Diagnosis not present

## 2021-03-24 DIAGNOSIS — K5900 Constipation, unspecified: Secondary | ICD-10-CM | POA: Diagnosis not present

## 2021-03-24 DIAGNOSIS — R2242 Localized swelling, mass and lump, left lower limb: Secondary | ICD-10-CM | POA: Diagnosis not present

## 2021-03-24 DIAGNOSIS — I5033 Acute on chronic diastolic (congestive) heart failure: Secondary | ICD-10-CM | POA: Diagnosis not present

## 2021-03-24 DIAGNOSIS — J9601 Acute respiratory failure with hypoxia: Secondary | ICD-10-CM | POA: Diagnosis not present

## 2021-03-24 DIAGNOSIS — R4182 Altered mental status, unspecified: Secondary | ICD-10-CM | POA: Diagnosis not present

## 2021-03-24 DIAGNOSIS — J45909 Unspecified asthma, uncomplicated: Secondary | ICD-10-CM | POA: Diagnosis not present

## 2021-03-24 DIAGNOSIS — R0902 Hypoxemia: Secondary | ICD-10-CM | POA: Diagnosis not present

## 2021-03-24 DIAGNOSIS — N1831 Chronic kidney disease, stage 3a: Secondary | ICD-10-CM | POA: Diagnosis not present

## 2021-03-24 DIAGNOSIS — G309 Alzheimer's disease, unspecified: Secondary | ICD-10-CM | POA: Diagnosis not present

## 2021-03-24 DIAGNOSIS — Z7401 Bed confinement status: Secondary | ICD-10-CM | POA: Diagnosis not present

## 2021-03-24 DIAGNOSIS — R0602 Shortness of breath: Secondary | ICD-10-CM | POA: Diagnosis not present

## 2021-03-24 DIAGNOSIS — F0280 Dementia in other diseases classified elsewhere without behavioral disturbance: Secondary | ICD-10-CM | POA: Diagnosis not present

## 2021-03-24 DIAGNOSIS — H9193 Unspecified hearing loss, bilateral: Secondary | ICD-10-CM | POA: Diagnosis not present

## 2021-03-24 DIAGNOSIS — I10 Essential (primary) hypertension: Secondary | ICD-10-CM | POA: Diagnosis not present

## 2021-03-24 DIAGNOSIS — Z950 Presence of cardiac pacemaker: Secondary | ICD-10-CM | POA: Diagnosis not present

## 2021-03-24 DIAGNOSIS — M792 Neuralgia and neuritis, unspecified: Secondary | ICD-10-CM | POA: Diagnosis not present

## 2021-03-24 DIAGNOSIS — Z89511 Acquired absence of right leg below knee: Secondary | ICD-10-CM | POA: Diagnosis not present

## 2021-03-24 DIAGNOSIS — J449 Chronic obstructive pulmonary disease, unspecified: Secondary | ICD-10-CM | POA: Diagnosis not present

## 2021-03-24 DIAGNOSIS — I469 Cardiac arrest, cause unspecified: Secondary | ICD-10-CM | POA: Diagnosis not present

## 2021-03-24 DIAGNOSIS — I5022 Chronic systolic (congestive) heart failure: Secondary | ICD-10-CM | POA: Diagnosis not present

## 2021-03-24 DIAGNOSIS — L97921 Non-pressure chronic ulcer of unspecified part of left lower leg limited to breakdown of skin: Secondary | ICD-10-CM | POA: Diagnosis not present

## 2021-03-24 DIAGNOSIS — E46 Unspecified protein-calorie malnutrition: Secondary | ICD-10-CM | POA: Diagnosis not present

## 2021-03-24 DIAGNOSIS — I5043 Acute on chronic combined systolic (congestive) and diastolic (congestive) heart failure: Secondary | ICD-10-CM | POA: Diagnosis not present

## 2021-03-24 DIAGNOSIS — M810 Age-related osteoporosis without current pathological fracture: Secondary | ICD-10-CM | POA: Diagnosis not present

## 2021-03-24 DIAGNOSIS — E039 Hypothyroidism, unspecified: Secondary | ICD-10-CM | POA: Diagnosis not present

## 2021-03-24 LAB — BASIC METABOLIC PANEL
Anion gap: 9 (ref 5–15)
BUN: 24 mg/dL — ABNORMAL HIGH (ref 8–23)
CO2: 27 mmol/L (ref 22–32)
Calcium: 9.3 mg/dL (ref 8.9–10.3)
Chloride: 102 mmol/L (ref 98–111)
Creatinine, Ser: 1.07 mg/dL — ABNORMAL HIGH (ref 0.44–1.00)
GFR, Estimated: 50 mL/min — ABNORMAL LOW (ref >60)
Glucose, Bld: 97 mg/dL (ref 70–99)
Potassium: 4.1 mmol/L (ref 3.5–5.1)
Sodium: 138 mmol/L (ref 135–145)

## 2021-03-24 LAB — SARS CORONAVIRUS 2 (TAT 6-24 HRS): SARS Coronavirus 2: NEGATIVE

## 2021-03-24 MED ORDER — ALBUTEROL SULFATE HFA 108 (90 BASE) MCG/ACT IN AERS: 2.0000 | INHALATION_SPRAY | RESPIRATORY_TRACT | Status: DC | PRN

## 2021-03-24 NOTE — Care Management Important Message (Signed)
Important Message  Patient Details  Name: [name] MRN: [id number] Date of Birth: [birth year]   Medicare Important Message Given:  Yes     Shelda Altes 03/24/2021, 10:09 AM

## 2021-03-24 NOTE — Progress Notes (Signed)
D/C instructions printed and placed in packet at nurse's station. Pt awaiting PTAR for transport.

## 2021-03-24 NOTE — TOC Progression Note (Signed)
Transition of Care [facility]) - Progression Note    Patient Details  Name: [name] MRN: [id number] Date of Birth: 11-28-1931  [facility]) CM/SW Contact  Reece Agar, [place] Phone Number: 03/24/2021, 9:17 AM  Clinical Narrative:    9:00am- pt covid back (negative), CSW contacted Olivia Mackie at [facility] who stated bed is ready for pt she will be in room 201A and to ask pt if she wants a covid booster. If so they will administer the vaccine at the facility. CSW contacted [name] pt relative and she stated she does want the booster. CSW will follow up when PTAR is called.   Expected Discharge Plan: [place] Barriers to Discharge: Continued Medical Work up  Expected Discharge Plan and Services Expected Discharge Plan: [place] In-house Referral: Clinical Social Work   Post Acute Care Choice: [place] Living arrangements for the past 2 months: [place] Research officer, political party)                                       Social Determinants of Health (SDOH) Interventions    Readmission Risk Interventions No flowsheet data found.

## 2021-03-24 NOTE — Discharge Summary (Signed)
Physician Discharge Summary   Patient ID: [name] MRN: [id number] DOB/AGE: 1931-08-09 87 y.o.  Admit date: 03/19/2021 Discharge date: 03/24/2021  Primary Care Physician:  Jani Gravel, MD   Recommendations for Outpatient Follow-up:  1. Follow up with PCP in 1-2 weeks 2. Please obtain BMP/CBC in one week   Home Health: Patient going to skilled nursing facility Equipment/Devices:   Discharge Condition: stable  CODE STATUS: FULL  Diet recommendation: Heart healthy diet   Discharge Diagnoses:    . Acute on chronic diastolic CHF (congestive heart failure) ([place]) . Acute respiratory failure with hypoxia ([place]) . Chronic kidney disease, stage 3a ([place]) . Chronic atrial fibrillation ([place]) . Obstructive lung disease (generalized) ([place]) . Hypothyroidism Generalized debility, underweight Right AKA  Consults:  None    Allergies:   Allergies  Allergen Reactions  . Codeine Nausea And Vomiting  . Morphine And Related Nausea And Vomiting  . Sulfa Antibiotics Nausea And Vomiting  . Ceftaroline Rash     DISCHARGE MEDICATIONS: Allergies as of 03/24/2021      Reactions   Codeine Nausea And Vomiting   Morphine And Related Nausea And Vomiting   Sulfa Antibiotics Nausea And Vomiting   Ceftaroline Rash      Medication List    TAKE these medications   acetaminophen 325 MG tablet Commonly known as: TYLENOL Take 650 mg by mouth 2 (two) times daily as needed for mild pain or fever. What changed: Another medication with the same name was removed. Continue taking this medication, and follow the directions you see here.   albuterol 108 (90 Base) MCG/ACT inhaler Commonly known as: VENTOLIN HFA Inhale 2 puffs into the lungs every 4 (four) hours as needed for shortness of breath or wheezing.   apixaban 2.5 MG Tabs tablet Commonly known as: Eliquis Take 1 tablet (2.5 mg total) by mouth 2 (two) times daily.   docusate sodium 50 MG capsule Commonly known as: COLACE Take 50 mg by  mouth daily.   ENSURE MAX PROTEIN PO Take 1 Can by mouth in the morning and at bedtime.   furosemide 20 MG tablet Commonly known as: LASIX Take 1 tablet (20 mg total) by mouth daily. What changed: how much to take   gabapentin 100 MG capsule Commonly known as: NEURONTIN Take 200 mg by mouth 2 (two) times daily.   hydrOXYzine 25 MG tablet Commonly known as: ATARAX/VISTARIL Take 12.5 mg by mouth 3 (three) times daily.   levothyroxine 25 MCG tablet Commonly known as: SYNTHROID Take 25 mcg by mouth daily before breakfast.   polyethylene glycol powder 17 GM/SCOOP powder Commonly known as: GLYCOLAX/MIRALAX Take 17 g by mouth daily as needed for mild constipation.            Discharge Care Instructions  (From admission, onward)         Start     Ordered   03/24/21 0000  Discharge wound care:       Comments: Wound care to the bilateral pretibial skin injuries:  Cleanse with NS, pat gently dry, cover lesions with folded piece of Xeroform gauze.  Top with dry gauze and secure with few turns of Kerlix roll gauze/paper tape.  Change daily   03/24/21 1006           Brief H and P: For complete details please refer to admission H and P, but in brief  Patient is an 87 year old female with history of COPD, CAD, complete heart block with pacer, dementia, CKD stage IIIa,  hypertrophic cardiomyopathy status post septal myotomy, right AKA, chronic diastolic CHF presented with shortness of breath.  Patient had worsening shortness of breath over the past several days, waking her from her sleep multiple times over the past couple of nights due to restlessness, orthopnea.  EMS was called out to her ALF, no significant improvement with breathing treatment. In ED, O2 sats mid 90s on 2 L, chest x-ray noted cardiomegaly.  CTA negative for PE, showed cardiomegaly with small bilateral pleural effusions, scattered groundglass opacities concerning for early edema.  Creatinine 1.1, BNP 1445.  Patient  was given Lasix in ED and admitted for further work-up.  Hospital Course:   Acute on chronic diastolic CHF (congestive heart failure) ([place]), acute respiratory failure with hypoxia -Patient presented with progressive shortness of breath, orthopnea, PND, elevated BNP -Patient was placed on IV Lasix, now transitioned to oral Lasix.  Negative balance of 3.8 L.  weight 105.8 on admission-> 101 lbs at discharge  -Creatinine stable, 1.05.  Continue Lasix 20 mg oral daily.  -2D echo showed EF of 50 to 55%, severe concentric LVH  Atrial fibrillation -Currently rate controlled, continue eliquis - CHADS-VASc at least 4   CKD stage IIIa -Baseline creatinine ~1 -Creatinine currently stable, at baseline  Hypothyroidism -TSH 3.6, continue Synthroid  History of COPD -Currently no wheezing, stable, continue albuterol inhaler as needed   History of right AKA -Currently no acute issues   Generalized debility, underweight Estimated body mass index is 18.43 kg/m as calculated from the following:   Height as of this encounter: 5\' 3"  (1.6 m).   Weight as of this encounter: 47.2 kg. PT recommended skilled nursing facility   Day of Discharge S: Doing well, no acute complaints, no chest pain or shortness of breath.  Hoping to be discharged today  BP (!) 147/79 (BP Location: Right Arm)   Pulse 67   Temp 97.6 F (36.4 C) (Oral)   Resp 18   Ht 5\' 3"  (1.6 m)   Wt 46 kg   SpO2 92%   BMI 17.96 kg/m   Physical Exam: General: Alert and awake oriented x3 not in any acute distress. CVS: S1-S2 clear no murmur rubs or gallops Chest: clear to auscultation bilaterally, no wheezing rales or rhonchi Abdomen: soft nontender, nondistended, normal bowel sounds Extremities: Right AKA, left LE lesions from prior blisters Neuro: Cranial nerves II-XII intact, no focal neurological deficits    Get Medicines reviewed and adjusted: Please take all your medications with you for your next visit with  your Primary MD  Please request your Primary MD to go over all hospital tests and procedure/radiological results at the follow up. Please ask your Primary MD to get all Hospital records sent to his/her office.  If you experience worsening of your admission symptoms, develop shortness of breath, life threatening emergency, suicidal or homicidal thoughts you must seek medical attention immediately by calling 911 or calling your MD immediately  if symptoms less severe.  You must read complete instructions/literature along with all the possible adverse reactions/side effects for all the Medicines you take and that have been prescribed to you. Take any new Medicines after you have completely understood and accept all the possible adverse reactions/side effects.   Do not drive when taking pain medications.   Do not take more than prescribed Pain, Sleep and Anxiety Medications  Special Instructions: If you have smoked or chewed Tobacco  in the last 2 yrs please stop smoking, stop any regular Alcohol  and or  any Recreational drug use.  Wear Seat belts while driving.  Please note  You were cared for by a hospitalist during your hospital stay. Once you are discharged, your primary care physician will handle any further medical issues. Please note that NO REFILLS for any discharge medications will be authorized once you are discharged, as it is imperative that you return to your primary care physician (or establish a relationship with a primary care physician if you do not have one) for your aftercare needs so that they can reassess your need for medications and monitor your lab values.   The results of significant diagnostics from this hospitalization (including imaging, microbiology, ancillary and laboratory) are listed below for reference.      Procedures/Studies:  DG Chest 2 View  Result Date: 03/19/2021 CLINICAL DATA:  Shortness of breath EXAM: CHEST - 2 VIEW COMPARISON:  10/02/2019 FINDINGS:  Surgical hardware in the right shoulder and cervical spine. Rotated patient. Post sternotomy changes. Enlarged cardiomediastinal silhouette with aortic atherosclerosis. No focal opacity, pleural effusion, or pneumothorax. Kyphosis of the spine. Right-sided pacing device, grossly stable allowing for rotation. IMPRESSION: No active cardiopulmonary disease. Cardiomegaly. Electronically Signed   By: Donavan Foil M.D.   On: 03/19/2021 16:47   CT Angio Chest PE W/Cm &/Or Wo Cm  Result Date: 03/19/2021 CLINICAL DATA:  Positive D-dimer EXAM: CT ANGIOGRAPHY CHEST WITH CONTRAST TECHNIQUE: Multidetector CT imaging of the chest was performed using the standard protocol during bolus administration of intravenous contrast. Multiplanar CT image reconstructions and MIPs were obtained to evaluate the vascular anatomy. CONTRAST:  65mL OMNIPAQUE IOHEXOL 350 MG/ML SOLN COMPARISON:  None. FINDINGS: Cardiovascular: No filling defects in the pulmonary arteries to suggest pulmonary emboli. Cardiomegaly. Densely calcified mitral valve, aortic valve and aorta. Prior CABG. No aneurysm. Mediastinum/Nodes: No mediastinal, hilar, or axillary adenopathy. Trachea and esophagus are unremarkable. Thyroid unremarkable. Lungs/Pleura: Small bilateral pleural effusions. Scattered ground-glass opacities throughout the lungs could reflect early edema. Upper Abdomen: Imaging into the upper abdomen demonstrates no acute findings. Musculoskeletal: Chest wall soft tissues are unremarkable. No acute bony abnormality. Review of the MIP images confirms the above findings. IMPRESSION: No evidence of pulmonary embolus. Cardiomegaly. Scattered ground-glass opacities in the lungs could reflect early edema. Small bilateral effusions. Aortic Atherosclerosis (ICD10-I70.0). Electronically Signed   By: Rolm Baptise M.D.   On: 03/19/2021 22:21   ECHOCARDIOGRAM COMPLETE  Result Date: 03/21/2021    ECHOCARDIOGRAM REPORT   Patient Name:   [name] Date of Exam:  03/21/2021 Medical Rec #:  [id number]     Height:       63.0 in Accession #:    [id number]    Weight:       105.8 lb Date of Birth:  08/25/1931     BSA:          1.475 m Patient Age:    87 years      BP:           136/86 mmHg Patient Gender: F             HR:           71 bpm. Exam Location:  Inpatient Procedure: 2D Echo, Cardiac Doppler and Color Doppler Indications:    CHF-Acute Diastolic  History:        Patient has prior history of Echocardiogram examinations, most                 recent 10/04/2019. CHF and Hypertrophic Cardiomyopathy, CAD,  Pacemaker, COPD, Mitral Valve Disease, Arrythmias:Atrial                 Fibrillation; Signs/Symptoms:Shortness of Breath. Heart block.                 CKD. S/P septal myotomy.  Sonographer:    Clayton Lefort RDCS (AE) Referring Phys: [id number] TIMOTHY S OPYD  Sonographer Comments: Technically difficult study due to poor echo windows and no subcostal window. Image acquisition challenging due to patient body habitus. Patient in Fowler's position due to SOB. IMPRESSIONS  1. Left ventricular ejection fraction, by best estimation, is 50 to 55%. The left ventricle has low normal function and not well visualized in all walls. Left ventricular endocardial border not optimally defined to evaluate regional wall motion. There is severe concentric left ventricular hypertrophy. Left ventricular diastolic parameters are indeterminate. Phenotypical suggestive of hypertrophic cardiomyopathy without LVOT obstruction.  2. Right ventricular systolic function is low normal. The right ventricular size is mildly enlarged.  3. Left atrial size was severely dilated.  4. Right atrial size was severely dilated.  5. The mitral valve is abnormal. Moderate to severe mitral valve regurgitation. Severe mitral annular calcification.  6. Tricuspid valve regurgitation is severe. Presence of diastolic tricuspid regurgitation may indicated part of the mechanism is related to device.  7. The aortic valve  is tricuspid. There is moderate calcification of the aortic valve. Aortic valve regurgitation is not visualized. Mild aortic valve sclerosis is present, with no evidence of aortic valve stenosis. Comparison(s): A prior study was performed on 10/04/2019. Valvular heart disease is worse. FINDINGS  Left Ventricle: Left ventricular ejection fraction, by estimation, is 50 to 55%. The left ventricle has low normal function. Left ventricular endocardial border not optimally defined to evaluate regional wall motion. The left ventricular internal cavity  size was small. There is severe concentric left ventricular hypertrophy. Left ventricular diastolic parameters are indeterminate. Right Ventricle: The right ventricular size is mildly enlarged. No increase in right ventricular wall thickness. Right ventricular systolic function is low normal. Left Atrium: Left atrial size was severely dilated. Right Atrium: Right atrial size was severely dilated. Pericardium: There is no evidence of pericardial effusion. Mitral Valve: The mitral valve is abnormal. There is severe thickening of the mitral valve leaflet(s). There is severe calcification of the mitral valve leaflet(s). Severe mitral annular calcification. Moderate to severe mitral valve regurgitation. MV peak gradient, 6.9 mmHg. The mean mitral valve gradient is 2.0 mmHg. Tricuspid Valve: The tricuspid valve is normal in structure. Tricuspid valve regurgitation is severe. Aortic Valve: The aortic valve is tricuspid. There is moderate calcification of the aortic valve. Aortic valve regurgitation is not visualized. Mild aortic valve sclerosis is present, with no evidence of aortic valve stenosis. Aortic valve mean gradient measures 3.0 mmHg. Aortic valve peak gradient measures 6.2 mmHg. Aortic valve area, by VTI measures 1.36 cm. Pulmonic Valve: The pulmonic valve was grossly normal. Pulmonic valve regurgitation is mild to moderate. No evidence of pulmonic stenosis. Aorta: The  aortic root and ascending aorta are structurally normal, with no evidence of dilitation. IAS/Shunts: The atrial septum is grossly normal. Additional Comments: A device lead is visualized.  LEFT VENTRICLE PLAX 2D LVIDd:         3.50 cm LVIDs:         2.60 cm LV PW:         2.20 cm LV IVS:        2.30 cm LVOT diam:     1.70  cm LV SV:         30 LV SV Index:   20 LVOT Area:     2.27 cm  LV Volumes (MOD) LV vol d, MOD A2C: 55.4 ml LV vol d, MOD A4C: 51.0 ml LV vol s, MOD A2C: 37.3 ml LV vol s, MOD A4C: 32.1 ml LV SV MOD A2C:     18.1 ml LV SV MOD A4C:     51.0 ml LV SV MOD BP:      22.3 ml RIGHT VENTRICLE RV Basal diam:  4.40 cm RV Mid diam:    3.00 cm RV S prime:     6.24 cm/s TAPSE (M-mode): 1.2 cm LEFT ATRIUM              Index        RIGHT ATRIUM           Index LA diam:        5.10 cm  3.46 cm/m   RA Area:     29.40 cm LA Vol (A2C):   172.0 ml 116.59 ml/m RA Volume:   106.00 ml 71.85 ml/m LA Vol (A4C):   153.0 ml 103.71 ml/m LA Biplane Vol: 162.0 ml 109.81 ml/m  AORTIC VALVE AV Area (Vmax):    1.38 cm AV Area (Vmean):   1.42 cm AV Area (VTI):     1.36 cm AV Vmax:           125.00 cm/s AV Vmean:          75.900 cm/s AV VTI:            0.218 m AV Peak Grad:      6.2 mmHg AV Mean Grad:      3.0 mmHg LVOT Vmax:         76.00 cm/s LVOT Vmean:        47.600 cm/s LVOT VTI:          0.131 m LVOT/AV VTI ratio: 0.60  AORTA Ao Root diam: 3.30 cm Ao Asc diam:  3.40 cm MITRAL VALVE                 TRICUSPID VALVE MV Area (PHT): 5.66 cm      TR Peak grad:   56.2 mmHg MV Area VTI:   1.07 cm      TR Vmax:        375.00 cm/s MV Peak grad:  6.9 mmHg MV Mean grad:  2.0 mmHg      SHUNTS MV Vmax:       1.31 m/s      Systemic VTI:  0.13 m MV Vmean:      57.4 cm/s     Systemic Diam: 1.70 cm MV Decel Time: 134 msec MR Peak grad:    109.8 mmHg MR Mean grad:    74.0 mmHg MR Vmax:         524.00 cm/s MR Vmean:        402.0 cm/s MR PISA:         4.02 cm MR PISA Eff ROA: 31 mm MR PISA Radius:  0.80 cm MV E velocity: 133.00 cm/s  MV A velocity: 46.50 cm/s MV E/A ratio:  2.86 Rudean Haskell MD Electronically signed by Rudean Haskell MD Signature Date/Time: 03/21/2021/2:12:36 PM    Final        LAB RESULTS: Basic Metabolic Panel: Recent Labs  Lab 03/23/21 0416 03/24/21 0355  NA 138 138  K 4.0 4.1  CL 103  102  CO2 24 27  GLUCOSE 103* 97  BUN 23 24*  CREATININE 1.05* 1.07*  CALCIUM 9.0 9.3   Liver Function Tests: No results for input(s): AST, ALT, ALKPHOS, BILITOT, PROT, ALBUMIN in the last 168 hours. No results for input(s): LIPASE, AMYLASE in the last 168 hours. No results for input(s): AMMONIA in the last 168 hours. CBC: Recent Labs  Lab 03/19/21 1704 03/20/21 0349  WBC 8.9 9.4  NEUTROABS 6.3  --   HGB 12.1 12.0  HCT 39.1 37.6  MCV 100.5* 96.2  PLT 256 271   Cardiac Enzymes: No results for input(s): CKTOTAL, CKMB, CKMBINDEX, TROPONINI in the last 168 hours. BNP: Invalid input(s): POCBNP CBG: No results for input(s): GLUCAP in the last 168 hours.     Disposition and Follow-up: Discharge Instructions    (HEART FAILURE PATIENTS) Call MD:  Anytime you have any of the following symptoms: 1) 3 pound weight gain in 24 hours or 5 pounds in 1 week 2) shortness of breath, with or without a dry hacking cough 3) swelling in the hands, feet or stomach 4) if you have to sleep on extra pillows at night in order to breathe.   Complete by: As directed    Diet - low sodium heart healthy   Complete by: As directed    Discharge wound care:   Complete by: As directed    Wound care to the bilateral pretibial skin injuries:  Cleanse with NS, pat gently dry, cover lesions with folded piece of Xeroform gauze.  Top with dry gauze and secure with few turns of Kerlix roll gauze/paper tape.  Change daily   Increase activity slowly   Complete by: As directed        DISPOSITION: Skilled nursing [employer]    Jani Gravel, MD. Schedule an appointment as soon  as possible for a visit in 2 week(s).   Specialty: Internal Medicine Contact information: [address] [place] [address] [phone]                Time coordinating discharge:  35 minutes  Signed:   Estill Cotta M.D. [facility] 03/24/2021, 10:06 AM

## 2021-03-24 NOTE — TOC Transition Note (Signed)
Transition of Care [facility]) - CM/SW Discharge Note   Patient Details  Name: [name] MRN: [id number] Date of Birth: 12-May-1931  [facility] CM/SW Contact:  Tresa Endo Phone Number: 03/24/2021, 9:23 AM   Clinical Narrative:    Patient will DC to: [facility] Anticipated DC date: 03/24/2021 Family notified: Cindy/Relative Transport by: Corey Harold   Per MD patient ready for DC to [facility] room 201A. RN to call report prior to discharge [phone] ). RN, patient, patient's family, and facility notified of DC. Discharge Summary and FL2 sent to facility. DC packet on chart. Ambulance transport requested for patient.   CSW will sign off for now as social work intervention is no longer needed. Please consult [place] again if new needs arise.      Final next level of care: Skilled Nursing Facility Barriers to Discharge: Continued Medical Work up   Patient Goals and CMS Choice Patient states their goals for this hospitalization and ongoing recovery are:: Rehab CMS Medicare.gov Compare Post Acute Care list provided to:: Patient Choice offered to / list presented to : Patient,NA (Relative)  Discharge Placement                       Discharge Plan and Services In-house Referral: Clinical Social Work   Post Acute Care Choice: [place]                               Social Determinants of Health (SDOH) Interventions     Readmission Risk Interventions No flowsheet data found.

## 2021-03-24 NOTE — Progress Notes (Signed)
Pt alert x2 however, pt keeps pulling her tele leads off, removed the wound dressing and pulled PIV. Pt for DC today, covid swab resulted., reorient frequently.

## 2021-03-24 NOTE — Plan of Care (Signed)
  Problem: Acute Rehab PT Goals(only PT should resolve) Goal: Pt Will Go Supine/Side To Sit Outcome: Adequate for Discharge Goal: Patient Will Transfer Sit To/From Stand Outcome: Adequate for Discharge Goal: Pt Will Transfer Bed To Chair/Chair To Bed Outcome: Adequate for Discharge Goal: PT Additional Goal #1 Outcome: Adequate for Discharge   Problem: Acute Rehab OT Goals (only OT should resolve) Goal: Pt. Will Perform Upper Body Bathing Outcome: Adequate for Discharge Goal: Pt. Will Perform Lower Body Bathing Outcome: Adequate for Discharge Goal: Pt. Will Perform Upper Body Dressing Outcome: Adequate for Discharge Goal: Pt. Will Perform Lower Body Dressing Outcome: Adequate for Discharge Goal: Pt. Will Transfer To Toilet Outcome: Adequate for Discharge Goal: Pt. Will Perform Toileting-Clothing Manipulation Outcome: Adequate for Discharge

## 2021-03-24 NOTE — Progress Notes (Addendum)
Heart Failure Navigator Progress Note  Assessed for Heart & Vascular TOC clinic readiness.  Unfortunately, at this time, the patient does not meet criteria due to significant dementia and valvular calcification. Plan for DC to SNF today.  Navigator available for reassessment of patient.   Pricilla Holm, RN, BSN Heart Failure Nurse Navigator [phone]

## 2021-03-24 NOTE — Plan of Care (Signed)

## 2021-03-24 NOTE — Progress Notes (Signed)
Report given to [facility], pt dressed with valuables at bedside. Waiting on PTAR.

## 2021-03-27 DIAGNOSIS — R2681 Unsteadiness on feet: Secondary | ICD-10-CM | POA: Diagnosis not present

## 2021-03-27 DIAGNOSIS — K5900 Constipation, unspecified: Secondary | ICD-10-CM | POA: Diagnosis not present

## 2021-03-27 DIAGNOSIS — G309 Alzheimer's disease, unspecified: Secondary | ICD-10-CM | POA: Diagnosis not present

## 2021-03-27 DIAGNOSIS — I5043 Acute on chronic combined systolic (congestive) and diastolic (congestive) heart failure: Secondary | ICD-10-CM | POA: Diagnosis not present

## 2021-03-30 ENCOUNTER — Other Ambulatory Visit: Payer: Self-pay | Admitting: *Deleted

## 2021-03-30 NOTE — Patient Outreach (Signed)
[name] resides in [facility] per [facility] (Patient [name]). Member screened for potential [facility] Care Management needs.  Communication sent to [facility] to inquire about transition plans.   Will continue to follow while member resides in SNF.    Marthenia Rolling, MSN, RN,BSN [place] Acute Care Coordinator [phone] [facility]) [phone]  (Toll free office)

## 2021-03-31 ENCOUNTER — Other Ambulatory Visit: Payer: Self-pay | Admitting: *Deleted

## 2021-03-31 NOTE — Patient Outreach (Signed)
[facility] Post- Acute Care Coordinator follow up. Member screened for potential [facility] Care Management needs.  Update received from [facility] SW indicating transition plan is possibly long term care. Care plan meeting scheduled for later in the week. Will know more at that time.   Will continue to follow for transition plans and potential [facility] services.    Marthenia Rolling, MSN, RN,BSN [place] Acute Care Coordinator [phone] [facility]) [phone]  (Toll free office)

## 2021-04-09 ENCOUNTER — Other Ambulatory Visit: Payer: Self-pay | Admitting: *Deleted

## 2021-04-09 NOTE — Patient Outreach (Signed)
[facility] Post- Acute Care Coordinator follow up. Member screened for potential [facility] Care Management needs.  [name] resides in [facility]. Communication sent to facility SW to inquire about updated transition plans.   Will continue to follow for potential [facility] Care Management needs.    Sharon Rolling, MSN, RN,BSN [place] Acute Care Coordinator [phone] [facility]) [phone]  (Toll free office)

## 2021-04-11 DIAGNOSIS — R2242 Localized swelling, mass and lump, left lower limb: Secondary | ICD-10-CM | POA: Diagnosis not present

## 2021-04-11 DIAGNOSIS — L97921 Non-pressure chronic ulcer of unspecified part of left lower leg limited to breakdown of skin: Secondary | ICD-10-CM | POA: Diagnosis not present

## 2021-04-11 DIAGNOSIS — I5022 Chronic systolic (congestive) heart failure: Secondary | ICD-10-CM | POA: Diagnosis not present

## 2021-04-12 ENCOUNTER — Other Ambulatory Visit: Payer: Self-pay | Admitting: *Deleted

## 2021-04-12 NOTE — Patient Outreach (Signed)
[place] Coordinator follow up. Member screened for potential [facility] Care Management needs.  Update received from [facility] indicating member's care plan meeting is scheduled for Monday 04/12/21. Transition plan is likely for LTC. However, transition plans will be confirmed on Monday.   Will continue to follow.    Marthenia Rolling, MSN, RN,BSN [place] Acute Care Coordinator [phone] [facility]) [phone]  (Toll free office)

## 2021-04-14 DIAGNOSIS — F05 Delirium due to known physiological condition: Secondary | ICD-10-CM | POA: Diagnosis not present

## 2021-04-14 DIAGNOSIS — G309 Alzheimer's disease, unspecified: Secondary | ICD-10-CM | POA: Diagnosis not present

## 2021-04-14 DIAGNOSIS — F0391 Unspecified dementia with behavioral disturbance: Secondary | ICD-10-CM | POA: Diagnosis not present

## 2021-04-16 ENCOUNTER — Other Ambulatory Visit: Payer: Self-pay

## 2021-04-16 ENCOUNTER — Non-Acute Institutional Stay: Payer: Medicare Other | Admitting: *Deleted

## 2021-04-16 DIAGNOSIS — Z515 Encounter for palliative care: Secondary | ICD-10-CM

## 2021-04-20 ENCOUNTER — Other Ambulatory Visit: Payer: Self-pay | Admitting: *Deleted

## 2021-04-20 NOTE — Patient Outreach (Signed)
[facility] Post- Acute Care Coordinator follow up. Member screened for potential [facility] Care Management needs.  Verified in [place] ( Patient [name]) that Mrs. Kurihara transitioned to private pay at [facility]. SNF SW previously reported member would likely transition to LTC.   Will sign off. No identifiable [facility] Care Management needs at this time.    Marthenia Rolling, MSN, RN,BSN [place] Acute Care Coordinator [phone] [facility]) [phone]  (Toll free office)

## 2021-04-26 DIAGNOSIS — Z79899 Other long term (current) drug therapy: Secondary | ICD-10-CM | POA: Diagnosis not present

## 2021-04-28 DIAGNOSIS — G309 Alzheimer's disease, unspecified: Secondary | ICD-10-CM | POA: Diagnosis not present

## 2021-04-28 DIAGNOSIS — F0391 Unspecified dementia with behavioral disturbance: Secondary | ICD-10-CM | POA: Diagnosis not present

## 2021-05-04 NOTE — Progress Notes (Signed)
COMMUNITY PALLIATIVE CARE RN NOTE  PATIENT NAME: [name] DOB: July 12, 1931 MRN: [id number]  PRIMARY CARE PROVIDER: Jani Gravel, MD  RESPONSIBLE PARTY:  Acct ID - Guarantor Home Phone Work Phone Relationship Acct Type  [id number] Malva Limes* [phone]  Self P/F     [place], [place], [place] [address]    [facility] facility AL unit to visit with patient. Went to patient's room and it was empty. Spoke with facility staff member who states that patient moved out of the facility last week and was placed in a skilled facility due to need for a higher level of care. Will reach out to niece to find out additional information.    Daryl Eastern, RN, BSN

## 2021-05-05 DIAGNOSIS — L988 Other specified disorders of the skin and subcutaneous tissue: Secondary | ICD-10-CM | POA: Diagnosis not present

## 2021-05-12 DIAGNOSIS — L988 Other specified disorders of the skin and subcutaneous tissue: Secondary | ICD-10-CM | POA: Diagnosis not present

## 2021-05-19 DIAGNOSIS — L988 Other specified disorders of the skin and subcutaneous tissue: Secondary | ICD-10-CM | POA: Diagnosis not present

## 2021-05-24 DIAGNOSIS — M24572 Contracture, left ankle: Secondary | ICD-10-CM | POA: Diagnosis not present

## 2021-05-24 DIAGNOSIS — L602 Onychogryphosis: Secondary | ICD-10-CM | POA: Diagnosis not present

## 2021-05-24 DIAGNOSIS — I739 Peripheral vascular disease, unspecified: Secondary | ICD-10-CM | POA: Diagnosis not present

## 2021-05-26 DIAGNOSIS — S81802D Unspecified open wound, left lower leg, subsequent encounter: Secondary | ICD-10-CM | POA: Diagnosis not present

## 2021-06-02 DIAGNOSIS — S81802D Unspecified open wound, left lower leg, subsequent encounter: Secondary | ICD-10-CM | POA: Diagnosis not present

## 2021-06-16 DIAGNOSIS — Z961 Presence of intraocular lens: Secondary | ICD-10-CM | POA: Diagnosis not present

## 2021-06-16 DIAGNOSIS — H04123 Dry eye syndrome of bilateral lacrimal glands: Secondary | ICD-10-CM | POA: Diagnosis not present

## 2021-07-07 DIAGNOSIS — F0391 Unspecified dementia with behavioral disturbance: Secondary | ICD-10-CM | POA: Diagnosis not present

## 2021-07-07 DIAGNOSIS — G309 Alzheimer's disease, unspecified: Secondary | ICD-10-CM | POA: Diagnosis not present

## 2021-07-11 DIAGNOSIS — I4891 Unspecified atrial fibrillation: Secondary | ICD-10-CM | POA: Diagnosis not present

## 2021-07-11 DIAGNOSIS — M25532 Pain in left wrist: Secondary | ICD-10-CM | POA: Diagnosis not present

## 2021-07-12 DIAGNOSIS — D519 Vitamin B12 deficiency anemia, unspecified: Secondary | ICD-10-CM | POA: Diagnosis not present

## 2021-07-12 DIAGNOSIS — M25532 Pain in left wrist: Secondary | ICD-10-CM | POA: Diagnosis not present

## 2021-07-12 DIAGNOSIS — I10 Essential (primary) hypertension: Secondary | ICD-10-CM | POA: Diagnosis not present

## 2021-07-13 DIAGNOSIS — R7989 Other specified abnormal findings of blood chemistry: Secondary | ICD-10-CM | POA: Diagnosis not present

## 2021-07-21 DIAGNOSIS — F0391 Unspecified dementia with behavioral disturbance: Secondary | ICD-10-CM | POA: Diagnosis not present

## 2021-07-21 DIAGNOSIS — F411 Generalized anxiety disorder: Secondary | ICD-10-CM | POA: Diagnosis not present

## 2021-07-21 DIAGNOSIS — G309 Alzheimer's disease, unspecified: Secondary | ICD-10-CM | POA: Diagnosis not present

## 2021-07-27 ENCOUNTER — Telehealth: Payer: Self-pay

## 2021-07-27 NOTE — Telephone Encounter (Signed)
Called and spoke with [name]. She stated that patient was placed in [facility] in [place] and the family does not have any part in her care whatsoever according to niece, [name]. She does not believe the son brought her transmitter when he place her patient in the home. [name] gave me a new numbers to try to contact [place], and Son [name]. Daughter [name] is still disconnected. [name]: [phone] [name] (son) : [phone] [name] (daughter) : [phone] : disconnected.  Next office visit : 10/28/2021  Also documented in [place].

## 2021-07-27 NOTE — Telephone Encounter (Signed)
It is a good place and if you call them we can have a transmitter connected

## 2021-07-30 NOTE — Telephone Encounter (Signed)
Spoke with [employer] @ [employer] and he stated that the transmitter had been unplugged and he went ahead and plugged it back in and will help her do a manual transmission today.  Also documented in [facility].

## 2021-08-04 DIAGNOSIS — F411 Generalized anxiety disorder: Secondary | ICD-10-CM | POA: Diagnosis not present

## 2021-08-04 DIAGNOSIS — F0391 Unspecified dementia with behavioral disturbance: Secondary | ICD-10-CM | POA: Diagnosis not present

## 2021-08-04 DIAGNOSIS — G309 Alzheimer's disease, unspecified: Secondary | ICD-10-CM | POA: Diagnosis not present

## 2021-08-14 DIAGNOSIS — D519 Vitamin B12 deficiency anemia, unspecified: Secondary | ICD-10-CM | POA: Diagnosis not present

## 2021-09-05 DIAGNOSIS — Z45018 Encounter for adjustment and management of other part of cardiac pacemaker: Secondary | ICD-10-CM | POA: Diagnosis not present

## 2021-09-05 DIAGNOSIS — Z950 Presence of cardiac pacemaker: Secondary | ICD-10-CM | POA: Diagnosis not present

## 2021-09-05 DIAGNOSIS — I5042 Chronic combined systolic (congestive) and diastolic (congestive) heart failure: Secondary | ICD-10-CM | POA: Diagnosis not present

## 2021-09-05 DIAGNOSIS — I442 Atrioventricular block, complete: Secondary | ICD-10-CM | POA: Diagnosis not present

## 2021-09-15 DIAGNOSIS — F0391 Unspecified dementia with behavioral disturbance: Secondary | ICD-10-CM | POA: Diagnosis not present

## 2021-09-15 DIAGNOSIS — G309 Alzheimer's disease, unspecified: Secondary | ICD-10-CM | POA: Diagnosis not present

## 2021-09-15 DIAGNOSIS — F411 Generalized anxiety disorder: Secondary | ICD-10-CM | POA: Diagnosis not present

## 2021-09-28 ENCOUNTER — Other Ambulatory Visit: Payer: Self-pay

## 2021-09-28 ENCOUNTER — Encounter: Payer: Self-pay | Admitting: Cardiology

## 2021-09-28 ENCOUNTER — Ambulatory Visit: Payer: Medicare Other | Admitting: Cardiology

## 2021-09-28 VITALS — BP 96/56 | HR 71 | Temp 97.1°F | Resp 16 | Ht 63.0 in | Wt 130.0 lb

## 2021-09-28 DIAGNOSIS — Z45018 Encounter for adjustment and management of other part of cardiac pacemaker: Secondary | ICD-10-CM | POA: Diagnosis not present

## 2021-09-28 DIAGNOSIS — I442 Atrioventricular block, complete: Secondary | ICD-10-CM

## 2021-09-28 DIAGNOSIS — I4821 Permanent atrial fibrillation: Secondary | ICD-10-CM | POA: Diagnosis not present

## 2021-09-28 DIAGNOSIS — Z950 Presence of cardiac pacemaker: Secondary | ICD-10-CM

## 2021-09-28 NOTE — Progress Notes (Signed)
Primary Physician/Referring:  Jani Gravel, MD  Patient ID: [name], female    DOB: [birth year], 87 y.o.   MRN: [id number]  Chief Complaint  Patient presents with   Pacemaker Check   Follow-up    1 year   HPI:    [name]  is a 87 y.o. Caucasian female  with complete heart block S/P permanent pacemaker implantation on 11/27/2013, permanent atrial fibrillation on chronic anticoagulation with Eliquis,  hypertrophic cardiomyopathy status post septal ablation at [facility] sometime in 9678, Chronic systolic and diastolic heart failure due to burnt out HOCM, EF improved to  normal on medical therapy by echo in 01/2019,  moderate pulmonary hypertension, severe COPD due to bronchial asthma,  stage III chronic kidney disease and anemia of chronic disease.  This is her annual visit for pacemaker check and heart failure check.  States that she is presently doing well.  She has not had any worsening dyspnea, cough is remained stable, no leg edema.  No PND or orthopnea.  No bleeding diathesis on anticoagulation.   Past Medical History:  Diagnosis Date   Acute on chronic diastolic CHF (congestive heart failure) ([place]) 05/18/2015   Acute respiratory failure (HCC) 06/09/2016   Anemia    Arthritis    Arthritis pain    Asthma    Blood transfusion    Bronchitis    Cancer (HCC)    lt breast   CHF (congestive heart failure) (HCC)    Constipation    COPD (chronic obstructive pulmonary disease) ([place])    Coronary artery disease    Current use of long term anticoagulation    Cystitis    Dementia ([place])    Encounter for care of pacemaker 06/13/2019   Heart block    Complete heart block post surgical requiring pacer   Humerus shaft fracture    nonsurgical, June 2017   Hypertr obst cardiomyop    Required septal myotomy   Hypokalemia    Postoperative, improved   Hyponatremia    Mild postoperative, improved   Memory difficulty    Mitral regurgitation    Pacemaker     Renal disorder    Stage III   Thyroid disease    hypothyroidism   Urinary incontinence    Past Surgical History:  Procedure Laterality Date   ABDOMINAL HYSTERECTOMY     ABOVE KNEE LEG AMPUTATION Right    Anterior cervical diskectomy and fusion     C-7   BELOW KNEE LEG AMPUTATION Right    BREAST BIOPSY     Left, negative   BREAST LUMPECTOMY  2012   left breast   cardiomyopathy     COLONOSCOPY     FEMUR FRACTURE SURGERY     Right   FRACTURE SURGERY     JOINT REPLACEMENT     ORIF ELBOW FRACTURE Right 04/08/2018   Procedure: OPEN REDUCTION INTERNAL FIXATION (ORIF) ELBOW/OLECRANON FRACTURE;  Surgeon: Renette Butters, MD;  Location: [facility];  Service: Orthopedics;  Laterality: Right;   PACEMAKER PLACEMENT  2006/11-27-2013   MDT dual chamber pacemaker implanted 2006 with gen change by Dr Lovena Le 11-27-2013   PERMANENT PACEMAKER GENERATOR CHANGE N/A 11/27/2013   Procedure: PERMANENT PACEMAKER GENERATOR CHANGE;  Surgeon: Evans Lance, MD;  [facility];  Service: Cardiovascular;  Laterality: N/A;   REPLACEMENT TOTAL KNEE BILATERAL     Septal myotomy     SHOULDER SURGERY     Right   WRIST FRACTURE SURGERY  Right   Family History  Problem Relation Age of Onset   Heart disease Mother    Hypertension Mother    Pancreatic cancer Mother    Cancer Mother        pancreatic   Pancreatic cancer Father    Cancer Father        pancreatic   Diabetes Brother    Pancreatic cancer Other    Heart disease Brother        Enlarged heart    Social History   Tobacco Use   Smoking status: Never   Smokeless tobacco: Never  Substance Use Topics   Alcohol use: Not Currently   Marital Status: Married   ROS  Review of Systems  Cardiovascular:  Negative for chest pain, dyspnea on exertion (chronic and stable) and leg swelling.  Gastrointestinal:  Negative for melena.  Objective  Blood pressure (!) 96/56, pulse 71, temperature (!) 97.1 F (36.2 C), temperature source Temporal,  resp. rate 16, height 5\' 3"  (1.6 m), weight 130 lb (59 kg), SpO2 93 %.  Vitals with BMI 09/28/2021 03/24/2021 03/23/2021  Height 5\' 3"  - -  Weight 130 lbs 101 lbs 7 oz -  BMI 96.75 91.63 -  Systolic 96 846 659  Diastolic 56 79 79  Pulse 71 67 65     Physical Exam Vitals and nursing note reviewed.  Constitutional:      General: She is not in acute distress.    Appearance: She is underweight. She is not ill-appearing.  Neck:     Thyroid: No thyromegaly.     Vascular: No carotid bruit or JVD.  Cardiovascular:     Rate and Rhythm: Normal rate and regular rhythm.     Pulses: Intact distal pulses.     Heart sounds: Murmur heard.  High-pitched blowing holosystolic murmur is present with a grade of 2/6 at the apex radiating to the apex.    No gallop.  Pulmonary:     Effort: Pulmonary effort is normal. No accessory muscle usage or respiratory distress.     Breath sounds: Rales (bilateral diffuse and extensive) present. No wheezing.  Abdominal:     General: Bowel sounds are normal.     Palpations: Abdomen is soft.  Musculoskeletal:        General: Deformity (right AKA) present.     Cervical back: Neck supple.     Left lower leg: No edema.  Skin:    General: Skin is dry.   Laboratory examination:   Recent Labs    03/22/21 0617 03/23/21 0416 03/24/21 0355  NA 141 138 138  K 4.1 4.0 4.1  CL 101 103 102  CO2 30 24 27   GLUCOSE 94 103* 97  BUN 20 23 24*  CREATININE 1.03* 1.05* 1.07*  CALCIUM 9.6 9.0 9.3  GFRNONAA 53* 51* 50*   CrCl cannot be calculated (Patient's most recent lab result is older than the maximum 21 days allowed.).  CMP Latest Ref Rng & Units 03/24/2021 03/23/2021 03/22/2021  Glucose 70 - 99 mg/dL 97 103(H) 94  BUN 8 - 23 mg/dL 24(H) 23 20  Creatinine 0.44 - 1.00 mg/dL 1.07(H) 1.05(H) 1.03(H)  Sodium 135 - 145 mmol/L 138 138 141  Potassium 3.5 - 5.1 mmol/L 4.1 4.0 4.1  Chloride 98 - 111 mmol/L 102 103 101  CO2 22 - 32 mmol/L 27 24 30   Calcium 8.9 - 10.3 mg/dL 9.3  9.0 9.6  Total Protein 6.5 - 8.1 g/dL - - -  Total Bilirubin  0.3 - 1.2 mg/dL - - -  Alkaline Phos 38 - 126 U/L - - -  AST 15 - 41 U/L - - -  ALT 0 - 44 U/L - - -   CBC Latest Ref Rng & Units 03/20/2021 03/19/2021 10/06/2019  WBC 4.0 - 10.5 K/uL 9.4 8.9 7.0  Hemoglobin 12.0 - 15.0 g/dL 12.0 12.1 14.3  Hematocrit 36.0 - 46.0 % 37.6 39.1 41.5  Platelets 150 - 400 K/uL 271 256 203   Lipid Panel  No results found for: CHOL, TRIG, HDL, CHOLHDL, VLDL, LDLCALC, LDLDIRECT HEMOGLOBIN A1C No results found for: HGBA1C, MPG TSH Recent Labs    03/23/21 0416  TSH 3.650     External labs:  Cholesterol, total 142.000 M 06/20/2019 HDL 39.000 mg 06/30/2020 LDL-C 100.000 ca 06/30/2020 Triglycerides 83.000 mg 06/30/2020  A1C 5.100 % 06/20/2019 TSH 2.910 06/30/2020  Hemoglobin 14.300 g/d 10/06/2019 Platelets 203.000 K/ 10/06/2019  Creatinine, Serum 1.260 MG/ 06/30/2020 Potassium 4.000 mm 10/06/2019 ALT (SGPT) 13.000 IU/ 06/30/2020  INR 1.390 04/06/2018  Medications and allergies   Allergies  Allergen Reactions   Codeine Nausea And Vomiting   Morphine And Related Nausea And Vomiting   Sulfa Antibiotics Nausea And Vomiting   Ceftaroline Rash    Current Outpatient Medications on File Prior to Visit  Medication Sig Dispense Refill   acetaminophen (TYLENOL) 325 MG tablet Take 650 mg by mouth 2 (two) times daily as needed for mild pain or fever.     albuterol (VENTOLIN HFA) 108 (90 Base) MCG/ACT inhaler Inhale 2 puffs into the lungs every 4 (four) hours as needed for shortness of breath or wheezing.     allopurinol (ZYLOPRIM) 100 MG tablet Take 100 mg by mouth daily.     apixaban (ELIQUIS) 2.5 MG TABS tablet Take 1 tablet (2.5 mg total) by mouth 2 (two) times daily. 60 tablet 1   cyanocobalamin 1000 MCG tablet Take 1,000 mcg by mouth daily.     docusate sodium (COLACE) 50 MG capsule Take 50 mg by mouth daily.     escitalopram (LEXAPRO) 10 MG tablet Take 10 mg by mouth daily.     furosemide (LASIX)  20 MG tablet Take 1 tablet (20 mg total) by mouth daily. (Patient taking differently: Take 10 mg by mouth daily.)     gabapentin (NEURONTIN) 100 MG capsule Take 200 mg by mouth 2 (two) times daily.     hydrOXYzine (ATARAX/VISTARIL) 25 MG tablet Take 12.5 mg by mouth 3 (three) times daily.     levothyroxine (SYNTHROID) 25 MCG tablet Take 25 mcg by mouth daily before breakfast.     memantine (NAMENDA) 5 MG tablet Take 5 mg by mouth 2 (two) times daily.     senna-docusate (SENOKOT-S) 8.6-50 MG tablet Take 1 tablet by mouth daily.     spironolactone (ALDACTONE) 25 MG tablet Take 0.5 tablets by mouth daily.     No current facility-administered medications on file prior to visit.    Radiology:   No results found.  Cardiac Studies:   Remote pacemaker check 12/23/2019: There were 0 high ventricular rate episodes detected. Health trends do not demonstrate significant abnormality. Battery longevity is 7 years. RV pacing is 99.9 %.  Scheduled  In office pacemaker check 09/28/21  Single (S)/Dual (D)/BV: D programmed VVI-R. Presenting VP @ 85/min. Pacemaker dependant:  No, ventricular escape @ 30/min. Underlying AF. AP NA%, VP 98%.   AMS Episodes NA.  AT/AF burden 100% .  HVR 5. Longest 6 seconds @ 150/min.  Latest 09/16/21. Longevity 6 Years. Magnet rate: >85%. Lead measurements: Stable. Histogram: Low (L)/normal (N)/high (H)  limited. Patient activity Low.   Observations: Normal pacemaker function. Changes: None.   Echocardiogram 10/04/2019:  Normal LVEF, 65-70%.  Severe LVH.  Diastolic function not evaluated. Normal right ventricle systolic function and size.  Severe mitral and the calcification, moderate to severe mitral valve regurgitation.  Mild-to-moderate tricuspid regurgitation, moderate pulmonary hypertension, PA systolic pressure 52 mmHg.  EKG  04/22/2020: Ventricular paced rhythm.  Assessment     ICD-10-CM   1. Encounter for care of pacemaker  Z45.018     2. Pacemaker Medtronic  Adapta dual-chamber pacemaker 2006, generator change 11/27/2013, programmed to VVI   Z95.0     3. Heart block AV complete (HCC)  I44.2     4. Permanent atrial fibrillation (HCC)  I48.21       No orders of the defined types were placed in this encounter.  Medications Discontinued During This Encounter  Medication Reason   Nutritional Supplements (ENSURE MAX PROTEIN PO) Error   polyethylene glycol powder (GLYCOLAX/MIRALAX) 17 GM/SCOOP powder Error     Recommendations:   [name]  is a 87 y.o. Caucasian female  with complete heart block S/P permanent pacemaker implantation on 11/27/2013, permanent atrial fibrillation on chronic anticoagulation with Eliquis,  hypertrophic cardiomyopathy status post septal ablation at [facility] sometime in 8309, Chronic systolic and diastolic heart failure due to burnt out HOCM, EF improved to  normal on medical therapy by echo in 01/2019,  moderate pulmonary hypertension, severe COPD due to bronchial asthma,  stage III chronic kidney disease and anemia of chronic disease.   Patient is pacemaker dependent with underlying complete heart block and permanent atrial fibrillation presently on long-term anticoagulation with Eliquis, Patient is currently euvolemic and not in congestive heart failure.  Patient is pacemaker dependent.    I will see her back in 1 year.  External labs reviewed.     Adrian Prows, MD, [facility] 09/28/2021, 4:38 PM Office: [phone] Pager: [phone]

## 2021-10-04 DIAGNOSIS — G309 Alzheimer's disease, unspecified: Secondary | ICD-10-CM | POA: Diagnosis not present

## 2021-10-04 DIAGNOSIS — F03918 Unspecified dementia, unspecified severity, with other behavioral disturbance: Secondary | ICD-10-CM | POA: Diagnosis not present

## 2021-10-04 DIAGNOSIS — F411 Generalized anxiety disorder: Secondary | ICD-10-CM | POA: Diagnosis not present

## 2021-10-28 ENCOUNTER — Ambulatory Visit: Payer: Medicare Other | Admitting: Cardiology

## 2021-11-16 DIAGNOSIS — Z79899 Other long term (current) drug therapy: Secondary | ICD-10-CM | POA: Diagnosis not present

## 2022-04-30 ENCOUNTER — Encounter: Payer: Self-pay | Admitting: Cardiology

## 2022-09-28 ENCOUNTER — Ambulatory Visit: Payer: Medicare Other | Admitting: Cardiology

## 2022-09-28 ENCOUNTER — Encounter: Payer: Self-pay | Admitting: Cardiology

## 2022-09-28 VITALS — BP 100/54 | HR 65 | Temp 97.9°F | Resp 16 | Ht 63.0 in | Wt 108.0 lb

## 2022-09-28 DIAGNOSIS — Z950 Presence of cardiac pacemaker: Secondary | ICD-10-CM

## 2022-09-28 DIAGNOSIS — I4821 Permanent atrial fibrillation: Secondary | ICD-10-CM

## 2022-09-28 DIAGNOSIS — I442 Atrioventricular block, complete: Secondary | ICD-10-CM

## 2022-09-28 NOTE — Progress Notes (Signed)
Primary Physician/Referring:  Donnajean Lopes, MD  Patient ID: [name], female    DOB: 07/15/1931, 88 y.o.   MRN: [id number]  No chief complaint on file.  HPI:    [name]  is a 88 y.o. Caucasian female  with complete heart block S/P permanent pacemaker implantation on 11/27/2013, permanent atrial fibrillation on chronic anticoagulation with Eliquis,  hypertrophic cardiomyopathy status post septal ablation at [facility] sometime in 0865, Chronic systolic and diastolic heart failure due to burnt out HOCM, EF improved to  normal on medical therapy by echo in 01/2019,  moderate pulmonary hypertension, severe COPD due to bronchial asthma,  stage III chronic kidney disease and anemia of chronic disease.   Patient presents for annual visit, she has now moved into [place] home and has been a resident there for >a year.  States that she is presently doing well.  She has not had any worsening dyspnea, cough is remained stable, no leg edema.  No PND or orthopnea.  No bleeding diathesis on anticoagulation.   Past Medical History:  Diagnosis Date  . Acute on chronic diastolic CHF (congestive heart failure) ([place]) 05/18/2015  . Acute respiratory failure ([place]) 06/09/2016  . Anemia   . Arthritis   . Arthritis pain   . Asthma   . Blood transfusion   . Bronchitis   . Cancer ([place])    lt breast  . CHF (congestive heart failure) ([place])   . Constipation   . COPD (chronic obstructive pulmonary disease) ([place])   . Coronary artery disease   . Current use of long term anticoagulation   . Cystitis   . Dementia ([place])   . Encounter for care of pacemaker 06/13/2019  . Heart block    Complete heart block post surgical requiring pacer  . Humerus shaft fracture    nonsurgical, June 2017  . Hypertr obst cardiomyop    Required septal myotomy  . Hypokalemia    Postoperative, improved  . Hyponatremia    Mild postoperative, improved  . Memory difficulty   . Mitral  regurgitation   . Pacemaker   . Renal disorder    Stage III  . Thyroid disease    hypothyroidism  . Urinary incontinence    Past Surgical History:  Procedure Laterality Date  . ABDOMINAL HYSTERECTOMY    . ABOVE KNEE LEG AMPUTATION Right   . Anterior cervical diskectomy and fusion     C-7  . BELOW KNEE LEG AMPUTATION Right   . BREAST BIOPSY     Left, negative  . BREAST LUMPECTOMY  2012   left breast  . cardiomyopathy    . COLONOSCOPY    . FEMUR FRACTURE SURGERY     Right  . FRACTURE SURGERY    . JOINT REPLACEMENT    . ORIF ELBOW FRACTURE Right 04/08/2018   Procedure: OPEN REDUCTION INTERNAL FIXATION (ORIF) ELBOW/OLECRANON FRACTURE;  Surgeon: Renette Butters, MD;  Location: [facility];  Service: Orthopedics;  Laterality: Right;  . PACEMAKER PLACEMENT  2006/11-27-2013   MDT dual chamber pacemaker implanted 2006 with gen change by Dr Lovena Le 11-27-2013  . PERMANENT PACEMAKER GENERATOR CHANGE N/A 11/27/2013   Procedure: PERMANENT PACEMAKER GENERATOR CHANGE;  Surgeon: Evans Lance, MD;  Location: [facility] CATH LAB;  Service: Cardiovascular;  Laterality: N/A;  . REPLACEMENT TOTAL KNEE BILATERAL    . Septal myotomy    . SHOULDER SURGERY     Right  . WRIST FRACTURE SURGERY  Right     Social History   Tobacco Use  . Smoking status: Never  . Smokeless tobacco: Never  Substance Use Topics  . Alcohol use: Not Currently   Marital Status: Married   ROS  Review of Systems  Cardiovascular:  Negative for chest pain, dyspnea on exertion (chronic and stable) and leg swelling.  Gastrointestinal:  Negative for melena.   Objective  Blood pressure (!) 100/54, pulse 65, temperature 97.9 F (36.6 C), temperature source Temporal, resp. rate 16, height '5\' 3"'$  (1.6 m).     09/28/2022   12:59 PM 09/28/2021    3:07 PM 03/24/2021    4:07 AM  Vitals with BMI  Height '5\' 3"'$  '5\' 3"'$    Weight  130 lbs 101 lbs 7 oz  BMI  97.35 32.99  Systolic 242 96 683  Diastolic 54 56 79  Pulse 65 71 67      Physical Exam Vitals reviewed.  Constitutional:      General: She is not in acute distress.    Appearance: She is underweight. She is not ill-appearing.  Neck:     Vascular: No carotid bruit or JVD.  Cardiovascular:     Rate and Rhythm: Normal rate and regular rhythm.     Pulses: Intact distal pulses.           Right popliteal pulse not accessible.     Heart sounds: Murmur heard.     Blowing holosystolic murmur is present with a grade of 2/6 at the apex radiating to the apex.     No gallop.  Pulmonary:     Effort: Pulmonary effort is normal. No accessory muscle usage.     Breath sounds: Rales (bilateral diffuse and extensive) present.  Abdominal:     General: Bowel sounds are normal.     Palpations: Abdomen is soft.  Musculoskeletal:        General: Deformity (right AKA) present.     Left lower leg: No edema.   Laboratory examination:      Latest Ref Rng & Units 03/24/2021    3:55 AM 03/23/2021    4:16 AM 03/22/2021    6:17 AM  CMP  Glucose 70 - 99 mg/dL 97  103  94   BUN 8 - 23 mg/dL '24  23  20   '$ Creatinine 0.44 - 1.00 mg/dL 1.07  1.05  1.03   Sodium 135 - 145 mmol/L 138  138  141   Potassium 3.5 - 5.1 mmol/L 4.1  4.0  4.1   Chloride 98 - 111 mmol/L 102  103  101   CO2 22 - 32 mmol/L '27  24  30   '$ Calcium 8.9 - 10.3 mg/dL 9.3  9.0  9.6       Latest Ref Rng & Units 03/20/2021    3:49 AM 03/19/2021    5:04 PM 10/06/2019    8:00 AM  CBC  WBC 4.0 - 10.5 K/uL 9.4  8.9  7.0   Hemoglobin 12.0 - 15.0 g/dL 12.0  12.1  14.3   Hematocrit 36.0 - 46.0 % 37.6  39.1  41.5   Platelets 150 - 400 K/uL 271  256  203     External labs:   Medications and allergies   Allergies  Allergen Reactions  . Codeine Nausea And Vomiting  . Morphine And Related Nausea And Vomiting  . Sulfa Antibiotics Nausea And Vomiting  . Ceftaroline Rash     Current Outpatient Medications:  .  allopurinol (ZYLOPRIM)  100 MG tablet, Take 100 mg by mouth daily., Disp: , Rfl:  .  apixaban (ELIQUIS) 2.5 MG  TABS tablet, Take 1 tablet (2.5 mg total) by mouth 2 (two) times daily., Disp: 60 tablet, Rfl: 1 .  cyanocobalamin 1000 MCG tablet, Take 1,000 mcg by mouth daily., Disp: , Rfl:  .  escitalopram (LEXAPRO) 10 MG tablet, Take 10 mg by mouth daily., Disp: , Rfl:  .  gabapentin (NEURONTIN) 100 MG capsule, Take 200 mg by mouth 2 (two) times daily., Disp: , Rfl:  .  levothyroxine (SYNTHROID) 25 MCG tablet, Take 25 mcg by mouth daily before breakfast., Disp: , Rfl:  .  memantine (NAMENDA) 5 MG tablet, Take 5 mg by mouth 2 (two) times daily., Disp: , Rfl:  .  senna-docusate (SENOKOT-S) 8.6-50 MG tablet, Take 1 tablet by mouth daily., Disp: , Rfl:  .  spironolactone (ALDACTONE) 25 MG tablet, Take 0.5 tablets by mouth daily., Disp: , Rfl:     Radiology:   No results found.  Cardiac Studies:   Scheduled single-chamber pacemaker transmission 09/05/2021: VP 99%.  Lead impedance and threshold within normal limits.  There was no high ventricular rate episodes.  Longevity 6 years and 2 months.  Normal pacemaker function.  Scheduled  In office pacemaker check 09/28/21  Single (S)/Dual (D)/BV: D programmed VVI-R. Presenting VP @ 85/min. Pacemaker dependant:  No, ventricular escape @ 30/min. Underlying AF. AP NA%, VP 98%.   AMS Episodes NA.  AT/AF burden 100% .  HVR 5. Longest 6 seconds @ 150/min. Latest 09/16/21. Longevity 6 Years. Magnet rate: >85%. Lead measurements: Stable. Histogram: Low (L)/normal (N)/high (H)  limited. Patient activity Low.   Observations: Normal pacemaker function. Changes: None.   Echocardiogram 10/04/2019:  Normal LVEF, 65-70%.  Severe LVH.  Diastolic function not evaluated. Normal right ventricle systolic function and size.  Severe mitral and the calcification, moderate to severe mitral valve regurgitation.  Mild-to-moderate tricuspid regurgitation, moderate pulmonary hypertension, PA systolic pressure 52 mmHg.  EKG  04/22/2020: Ventricular paced rhythm.  Assessment      ICD-10-CM   1. Heart block AV complete (HCC)  I44.2     2. Pacemaker Medtronic Adapta dual-chamber pacemaker 2006, generator change 11/27/2013, programmed to VVI   Z95.0     3. Permanent atrial fibrillation (HCC)  I48.21       No orders of the defined types were placed in this encounter.  Medications Discontinued During This Encounter  Medication Reason  . hydrOXYzine (ATARAX/VISTARIL) 25 MG tablet   . furosemide (LASIX) 20 MG tablet   . docusate sodium (COLACE) 50 MG capsule   . albuterol (VENTOLIN HFA) 108 (90 Base) MCG/ACT inhaler      Recommendations:   [name]  is a 88 y.o. Caucasian female  with complete heart block S/P permanent pacemaker implantation on 11/27/2013, permanent atrial fibrillation on chronic anticoagulation with Eliquis,  hypertrophic cardiomyopathy status post septal ablation at [facility] sometime in 2426, Chronic systolic and diastolic heart failure due to burnt out HOCM, EF improved to  normal on medical therapy by echo in 01/2019,  moderate pulmonary hypertension, severe COPD due to bronchial asthma,  stage III chronic kidney disease and anemia of chronic disease.   Patient presents for annual visit, she has now moved into [place] home and has been a resident there for >a year.  I will make another appointment for her to come back in 2 to 3 weeks so we can check a pacemaker and  also will request if she has transmitter in the nursing home so she can start transmitting remotely.  With regard to atrial fibrillation, she is pacemaker dependent and ventricular rate is well controlled as she has had AV nodal ablation.  She is now in a nursing home and has developed cognitive deficits as well.  We will not do any aggressive evaluation including testing/echocardiogram as long as she remains asymptomatic.  I have also requested the nursing home to send me labs.  She has been scheduled to see [place] back on 11/17/2022.  After which  I will see  her back in 1 year for pacemaker check.     Adrian Prows, MD, [facility] 09/28/2022, 1:09 PM Office: [phone] Pager: [phone]

## 2022-11-16 NOTE — Progress Notes (Unsigned)
Chief Complaint  Patient presents with   Pacemaker Check   Encounter for care of pacemaker  Pacemaker Medtronic Adapta dual-chamber pacemaker 2006, generator change 11/27/2013, programmed to VVI   Heart block AV complete ([place])  Permanent atrial fibrillation ([place])  Scheduled  In office pacemaker check 11/16/22  Single (S)/Dual (D)/BV: D. Presenting VP. Pacemaker dependant:  Yes. Underlying No escape. AP NA%, VP 100%.  AMS Episodes NA 100% AF.  HVR 28. Longest 5. Latest 11/11/22. Longevity 4.5 Years. Magnet rate: >85%. Lead measurements: Stable. Histogram: Low (L)/normal (N)/high (H)  Normal. Patient activity Low.   Observations: Normal pacemaker function. Changes: None.  Scheduled single-chamber pacemaker transmission 11/15/2022: VP 99%.  Lead impedance and threshold within normal limits.  There was 28 high ventricular rate episodes, brief VT.  Longevity 4  years and 5 months.  Normal pacemaker function.

## 2022-11-17 ENCOUNTER — Ambulatory Visit: Payer: Medicare Other | Admitting: Cardiology

## 2022-11-17 DIAGNOSIS — Z45018 Encounter for adjustment and management of other part of cardiac pacemaker: Secondary | ICD-10-CM

## 2022-11-17 DIAGNOSIS — Z950 Presence of cardiac pacemaker: Secondary | ICD-10-CM

## 2022-11-17 DIAGNOSIS — I442 Atrioventricular block, complete: Secondary | ICD-10-CM

## 2022-11-17 DIAGNOSIS — I4821 Permanent atrial fibrillation: Secondary | ICD-10-CM

## 2022-12-19 ENCOUNTER — Emergency Department (HOSPITAL_COMMUNITY): Payer: Medicare Other

## 2022-12-19 ENCOUNTER — Inpatient Hospital Stay (HOSPITAL_COMMUNITY)
Admission: EM | Admit: 2022-12-19 | Discharge: 2022-12-22 | DRG: 193 | Disposition: A | Discharge status: Other Acute Inpatient Hospital | Payer: Medicare Other | Source: Skilled Nursing Facility | Attending: Family Medicine | Admitting: Family Medicine

## 2022-12-19 ENCOUNTER — Encounter (HOSPITAL_COMMUNITY): Payer: Self-pay

## 2022-12-19 DIAGNOSIS — I2510 Atherosclerotic heart disease of native coronary artery without angina pectoris: Secondary | ICD-10-CM | POA: Diagnosis present

## 2022-12-19 DIAGNOSIS — N179 Acute kidney failure, unspecified: Secondary | ICD-10-CM | POA: Diagnosis not present

## 2022-12-19 DIAGNOSIS — D631 Anemia in chronic kidney disease: Secondary | ICD-10-CM | POA: Diagnosis present

## 2022-12-19 DIAGNOSIS — R0902 Hypoxemia: Secondary | ICD-10-CM

## 2022-12-19 DIAGNOSIS — F0393 Unspecified dementia, unspecified severity, with mood disturbance: Secondary | ICD-10-CM | POA: Diagnosis present

## 2022-12-19 DIAGNOSIS — I5042 Chronic combined systolic (congestive) and diastolic (congestive) heart failure: Secondary | ICD-10-CM | POA: Insufficient documentation

## 2022-12-19 DIAGNOSIS — I10 Essential (primary) hypertension: Secondary | ICD-10-CM | POA: Diagnosis present

## 2022-12-19 DIAGNOSIS — G8929 Other chronic pain: Secondary | ICD-10-CM | POA: Diagnosis present

## 2022-12-19 DIAGNOSIS — Z89511 Acquired absence of right leg below knee: Secondary | ICD-10-CM

## 2022-12-19 DIAGNOSIS — Z1152 Encounter for screening for COVID-19: Secondary | ICD-10-CM

## 2022-12-19 DIAGNOSIS — J121 Respiratory syncytial virus pneumonia: Secondary | ICD-10-CM | POA: Diagnosis present

## 2022-12-19 DIAGNOSIS — I272 Pulmonary hypertension, unspecified: Secondary | ICD-10-CM | POA: Diagnosis present

## 2022-12-19 DIAGNOSIS — N1831 Chronic kidney disease, stage 3a: Secondary | ICD-10-CM | POA: Diagnosis present

## 2022-12-19 DIAGNOSIS — I130 Hypertensive heart and chronic kidney disease with heart failure and stage 1 through stage 4 chronic kidney disease, or unspecified chronic kidney disease: Secondary | ICD-10-CM | POA: Diagnosis present

## 2022-12-19 DIAGNOSIS — I4891 Unspecified atrial fibrillation: Secondary | ICD-10-CM | POA: Diagnosis present

## 2022-12-19 DIAGNOSIS — J440 Chronic obstructive pulmonary disease with acute lower respiratory infection: Secondary | ICD-10-CM | POA: Diagnosis present

## 2022-12-19 DIAGNOSIS — E039 Hypothyroidism, unspecified: Secondary | ICD-10-CM | POA: Diagnosis present

## 2022-12-19 DIAGNOSIS — F329 Major depressive disorder, single episode, unspecified: Secondary | ICD-10-CM | POA: Diagnosis present

## 2022-12-19 DIAGNOSIS — M109 Gout, unspecified: Secondary | ICD-10-CM | POA: Diagnosis present

## 2022-12-19 DIAGNOSIS — I5032 Chronic diastolic (congestive) heart failure: Secondary | ICD-10-CM | POA: Diagnosis present

## 2022-12-19 DIAGNOSIS — I4821 Permanent atrial fibrillation: Secondary | ICD-10-CM | POA: Diagnosis present

## 2022-12-19 DIAGNOSIS — Z8249 Family history of ischemic heart disease and other diseases of the circulatory system: Secondary | ICD-10-CM

## 2022-12-19 DIAGNOSIS — Z950 Presence of cardiac pacemaker: Secondary | ICD-10-CM | POA: Diagnosis present

## 2022-12-19 DIAGNOSIS — J45909 Unspecified asthma, uncomplicated: Secondary | ICD-10-CM | POA: Diagnosis present

## 2022-12-19 DIAGNOSIS — I422 Other hypertrophic cardiomyopathy: Secondary | ICD-10-CM | POA: Diagnosis present

## 2022-12-19 DIAGNOSIS — I081 Rheumatic disorders of both mitral and tricuspid valves: Secondary | ICD-10-CM | POA: Diagnosis present

## 2022-12-19 DIAGNOSIS — Z800 Family history of malignant neoplasm of digestive organs: Secondary | ICD-10-CM

## 2022-12-19 DIAGNOSIS — Z66 Do not resuscitate: Secondary | ICD-10-CM | POA: Diagnosis present

## 2022-12-19 DIAGNOSIS — E876 Hypokalemia: Secondary | ICD-10-CM | POA: Diagnosis not present

## 2022-12-19 DIAGNOSIS — I421 Obstructive hypertrophic cardiomyopathy: Secondary | ICD-10-CM | POA: Diagnosis present

## 2022-12-19 DIAGNOSIS — K5909 Other constipation: Secondary | ICD-10-CM | POA: Diagnosis present

## 2022-12-19 DIAGNOSIS — D6959 Other secondary thrombocytopenia: Secondary | ICD-10-CM | POA: Diagnosis present

## 2022-12-19 DIAGNOSIS — I509 Heart failure, unspecified: Secondary | ICD-10-CM

## 2022-12-19 DIAGNOSIS — Z8679 Personal history of other diseases of the circulatory system: Secondary | ICD-10-CM | POA: Diagnosis not present

## 2022-12-19 DIAGNOSIS — N189 Chronic kidney disease, unspecified: Secondary | ICD-10-CM

## 2022-12-19 DIAGNOSIS — J9601 Acute respiratory failure with hypoxia: Secondary | ICD-10-CM | POA: Diagnosis present

## 2022-12-19 DIAGNOSIS — Z89611 Acquired absence of right leg above knee: Secondary | ICD-10-CM | POA: Diagnosis not present

## 2022-12-19 DIAGNOSIS — J13 Pneumonia due to Streptococcus pneumoniae: Secondary | ICD-10-CM | POA: Diagnosis present

## 2022-12-19 DIAGNOSIS — Z79899 Other long term (current) drug therapy: Secondary | ICD-10-CM

## 2022-12-19 DIAGNOSIS — Z79890 Hormone replacement therapy (postmenopausal): Secondary | ICD-10-CM

## 2022-12-19 DIAGNOSIS — Z833 Family history of diabetes mellitus: Secondary | ICD-10-CM

## 2022-12-19 DIAGNOSIS — J441 Chronic obstructive pulmonary disease with (acute) exacerbation: Secondary | ICD-10-CM | POA: Diagnosis present

## 2022-12-19 DIAGNOSIS — Z7901 Long term (current) use of anticoagulants: Secondary | ICD-10-CM

## 2022-12-19 LAB — COMPREHENSIVE METABOLIC PANEL
ALT: 15 U/L (ref 0–44)
AST: 28 U/L (ref 15–41)
Albumin: 3.7 g/dL (ref 3.5–5.0)
Alkaline Phosphatase: 59 U/L (ref 38–126)
Anion gap: 14 (ref 5–15)
BUN: 38 mg/dL — ABNORMAL HIGH (ref 8–23)
CO2: 20 mmol/L — ABNORMAL LOW (ref 22–32)
Calcium: 8.1 mg/dL — ABNORMAL LOW (ref 8.9–10.3)
Chloride: 109 mmol/L (ref 98–111)
Creatinine, Ser: 1.24 mg/dL — ABNORMAL HIGH (ref 0.44–1.00)
GFR, Estimated: 42 mL/min — ABNORMAL LOW (ref >60)
Glucose, Bld: 186 mg/dL — ABNORMAL HIGH (ref 70–99)
Potassium: 2.8 mmol/L — ABNORMAL LOW (ref 3.5–5.1)
Sodium: 143 mmol/L (ref 135–145)
Total Bilirubin: 1.5 mg/dL — ABNORMAL HIGH (ref 0.3–1.2)
Total Protein: 6.6 g/dL (ref 6.5–8.1)

## 2022-12-19 LAB — CBC WITH DIFFERENTIAL/PLATELET
Abs Immature Granulocytes: 0.08 10*3/uL — ABNORMAL HIGH (ref 0.00–0.07)
Basophils Absolute: 0 10*3/uL (ref 0.0–0.1)
Basophils Relative: 0 %
Eosinophils Absolute: 0 10*3/uL (ref 0.0–0.5)
Eosinophils Relative: 0 %
HCT: 41.3 % (ref 36.0–46.0)
Hemoglobin: 13.3 g/dL (ref 12.0–15.0)
Immature Granulocytes: 1 %
Lymphocytes Relative: 2 %
Lymphs Abs: 0.3 10*3/uL — ABNORMAL LOW (ref 0.7–4.0)
MCH: 34.3 pg — ABNORMAL HIGH (ref 26.0–34.0)
MCHC: 32.2 g/dL (ref 30.0–36.0)
MCV: 106.4 fL — ABNORMAL HIGH (ref 80.0–100.0)
Monocytes Absolute: 0.3 10*3/uL (ref 0.1–1.0)
Monocytes Relative: 2 %
Neutro Abs: 12.8 10*3/uL — ABNORMAL HIGH (ref 1.7–7.7)
Neutrophils Relative %: 95 %
Platelets: 138 10*3/uL — ABNORMAL LOW (ref 150–400)
RBC: 3.88 MIL/uL (ref 3.87–5.11)
RDW: 14.7 % (ref 11.5–15.5)
WBC: 13.5 10*3/uL — ABNORMAL HIGH (ref 4.0–10.5)
nRBC: 0 % (ref 0.0–0.2)

## 2022-12-19 LAB — RESP PANEL BY RT-PCR (RSV, FLU A&B, COVID)  RVPGX2
Influenza A by PCR: NEGATIVE
Influenza B by PCR: NEGATIVE
Resp Syncytial Virus by PCR: POSITIVE — AB
SARS Coronavirus 2 by RT PCR: NEGATIVE

## 2022-12-19 LAB — TROPONIN I (HIGH SENSITIVITY)
Troponin I (High Sensitivity): 266 ng/L (ref <18)
Troponin I (High Sensitivity): 290 ng/L (ref <18)
Troponin I (High Sensitivity): 288 ng/L (ref <18)

## 2022-12-19 LAB — MAGNESIUM: Magnesium: 2.2 mg/dL (ref 1.7–2.4)

## 2022-12-19 LAB — BRAIN NATRIURETIC PEPTIDE: B Natriuretic Peptide: 1536.3 pg/mL — ABNORMAL HIGH (ref 0.0–100.0)

## 2022-12-19 MED ORDER — LEVOTHYROXINE SODIUM 25 MCG PO TABS
25.0000 ug | ORAL_TABLET | Freq: Every day | ORAL | Status: DC
  Administered 2022-12-20 – 2022-12-22 (×3): 25 ug via ORAL
  Filled 2022-12-19 (×3): qty 1

## 2022-12-19 MED ORDER — SENNOSIDES-DOCUSATE SODIUM 8.6-50 MG PO TABS
1.0000 | ORAL_TABLET | Freq: Two times a day (BID) | ORAL | Status: DC
  Administered 2022-12-20 – 2022-12-22 (×6): 1 via ORAL
  Filled 2022-12-19 (×6): qty 1

## 2022-12-19 MED ORDER — ALBUTEROL SULFATE (2.5 MG/3ML) 0.083% IN NEBU: 2.5000 mg | INHALATION_SOLUTION | RESPIRATORY_TRACT | Status: DC | PRN

## 2022-12-19 MED ORDER — LEVOFLOXACIN IN D5W 750 MG/150ML IV SOLN
750.0000 mg | Freq: Once | INTRAVENOUS | Status: AC
  Administered 2022-12-19: 750 mg via INTRAVENOUS
  Filled 2022-12-19: qty 150

## 2022-12-19 MED ORDER — SODIUM CHLORIDE 0.9 % IV BOLUS
500.0000 mL | Freq: Once | INTRAVENOUS | Status: AC
  Administered 2022-12-19: 500 mL via INTRAVENOUS

## 2022-12-19 MED ORDER — ALLOPURINOL 100 MG PO TABS
100.0000 mg | ORAL_TABLET | Freq: Every day | ORAL | Status: DC
  Administered 2022-12-20 – 2022-12-22 (×3): 100 mg via ORAL
  Filled 2022-12-19 (×3): qty 1

## 2022-12-19 MED ORDER — POTASSIUM CHLORIDE CRYS ER 20 MEQ PO TBCR
40.0000 meq | EXTENDED_RELEASE_TABLET | Freq: Once | ORAL | Status: DC
  Filled 2022-12-19: qty 2

## 2022-12-19 MED ORDER — POTASSIUM CHLORIDE 10 MEQ/100ML IV SOLN
10.0000 meq | INTRAVENOUS | Status: AC
  Administered 2022-12-19 (×3): 10 meq via INTRAVENOUS
  Filled 2022-12-19 (×3): qty 100

## 2022-12-19 MED ORDER — MEMANTINE HCL 10 MG PO TABS
10.0000 mg | ORAL_TABLET | Freq: Two times a day (BID) | ORAL | Status: DC
  Administered 2022-12-20 – 2022-12-22 (×6): 10 mg via ORAL
  Filled 2022-12-19 (×2): qty 1
  Filled 2022-12-19 (×2): qty 2
  Filled 2022-12-19 (×2): qty 1

## 2022-12-19 MED ORDER — VITAMIN B-12 1000 MCG PO TABS
1000.0000 ug | ORAL_TABLET | Freq: Every day | ORAL | Status: DC
  Administered 2022-12-20 – 2022-12-22 (×3): 1000 ug via ORAL
  Filled 2022-12-19 (×3): qty 1

## 2022-12-19 MED ORDER — GABAPENTIN 100 MG PO CAPS
200.0000 mg | ORAL_CAPSULE | Freq: Two times a day (BID) | ORAL | Status: DC
  Administered 2022-12-20 – 2022-12-22 (×6): 200 mg via ORAL
  Filled 2022-12-19 (×6): qty 2

## 2022-12-19 MED ORDER — ESCITALOPRAM OXALATE 10 MG PO TABS
10.0000 mg | ORAL_TABLET | Freq: Every day | ORAL | Status: DC
  Administered 2022-12-20 – 2022-12-22 (×3): 10 mg via ORAL
  Filled 2022-12-19 (×3): qty 1

## 2022-12-19 MED ORDER — FUROSEMIDE 10 MG/ML IJ SOLN
40.0000 mg | Freq: Once | INTRAMUSCULAR | Status: AC
  Administered 2022-12-19: 40 mg via INTRAVENOUS
  Filled 2022-12-19: qty 4

## 2022-12-19 MED ORDER — APIXABAN 2.5 MG PO TABS
2.5000 mg | ORAL_TABLET | Freq: Two times a day (BID) | ORAL | Status: DC
  Administered 2022-12-20 – 2022-12-22 (×6): 2.5 mg via ORAL
  Filled 2022-12-19 (×6): qty 1

## 2022-12-19 MED ORDER — SODIUM CHLORIDE (PF) 0.9 % IJ SOLN
INTRAMUSCULAR | Status: AC
  Administered 2022-12-19: 10 mL
  Filled 2022-12-19: qty 50

## 2022-12-19 MED ORDER — IOHEXOL 350 MG/ML SOLN
75.0000 mL | Freq: Once | INTRAVENOUS | Status: AC | PRN
  Administered 2022-12-19: 75 mL via INTRAVENOUS

## 2022-12-19 NOTE — Assessment & Plan Note (Signed)
-  secondary to RSV pneumonia vs superimposed bacterial pneumonia requiring 3L -she is RSV positive with findings of multifocal pneumonia seen on CTA chest -Started on Levaquin in ED. Will check procalcitonin -wean O2 as tolerated with goal of >92%

## 2022-12-19 NOTE — Assessment & Plan Note (Signed)
-  Last echocardiogram on 03/2021 with EF of 50 to 55% with severe concentric LVH, severely dilated left and right atrium, moderate to severe mitral valve regurgitation with severe mitral annular calcification.  Severe tricuspid valve regurgitation. -BNP is elevated to 1536 but clinically appears dry and has AKI. BNP appears chronically elevated. -she was administered IV '40mg'$  of Lasix in ED. Will give IV 500 bolus and hold any further Lasix for now.

## 2022-12-19 NOTE — Assessment & Plan Note (Signed)
-  rate controlled with pacemaker -continue Eliquis

## 2022-12-19 NOTE — Assessment & Plan Note (Signed)
S/p pacemaker

## 2022-12-19 NOTE — ED Provider Notes (Addendum)
[facility] DEPT Provider Note   CSN: [id number] Arrival date & time: 12/19/22  1440     History  Chief Complaint  Patient presents with   Shortness of Breath    [name] is a 89 y.o. female.   Shortness of Breath    89 year old female with medical history significant for dementia with DNR in place, complete heart block status post PPM, CHF with diastolic dysfunction, COPD, CAD, CKD stage III, hypothyroidism who presents to the emergency department with shortness of breath.  She [facility] from [facility] for shortness of breath for the past 2 days.  It was noted that the patient was 70% on room air.  She was administered to albuterol nebulizer this was brought up to 90% and she was placed on oxygen.  She had little improvement throughout the day today and so EMS was called for transport to the emergency department for further evaluation.  Per facility staff, the patient was at her baseline mental status.  Her room air sats were noted to be 86% and so she was administered 2 DuoNebs with EMS, 10 of albuterol and 1 of Atrovent, wheezes were noted in multiple lung fields with rhonchi present.  She was administered 125 mg of Solu-Medrol in addition to 200 cc of normal saline.  CBG with EMS was noted to be 150.  On arrival, the patient was afebrile, hemodynamically stable, paced rhythm noted on cardiac telemetry, saturating 100% on 3 L O2 via nasal cannula.  Remainder of history of present illness limited by the patient's dementia although she does state that she has been short of breath lately. She denies any chest pain.  Home Medications Prior to Admission medications   Medication Sig Start Date End Date Taking? Authorizing Provider  albuterol (PROVENTIL) (2.5 MG/3ML) 0.083% nebulizer solution Take 2.5 mg by nebulization every 4 (four) hours as needed for wheezing or shortness of breath.   Yes [provider]  allopurinol (ZYLOPRIM) 100 MG  tablet Take 100 mg by mouth daily. 09/13/21  Yes [provider]  apixaban (ELIQUIS) 2.5 MG TABS tablet Take 1 tablet (2.5 mg total) by mouth 2 (two) times daily. 10/01/14  Yes Angiulli, Lavon Paganini, PA-C  carbamide peroxide (DEBROX) 6.5 % OTIC solution 3 drops every Monday.   Yes [provider]  cyanocobalamin 1000 MCG tablet Take 1,000 mcg by mouth daily.   Yes [provider]  escitalopram (LEXAPRO) 10 MG tablet Take 10 mg by mouth daily. 09/15/21  Yes [provider]  furosemide (LASIX) 40 MG tablet Take 40 mg by mouth in the morning.   Yes [provider]  gabapentin (NEURONTIN) 100 MG capsule Take 200 mg by mouth 2 (two) times daily. 12/27/18  Yes [provider]  lactulose (CHRONULAC) 10 GM/15ML solution Take 20 g by mouth daily.   Yes [provider]  latanoprost (XALATAN) 0.005 % ophthalmic solution Place 1 drop into both eyes at bedtime.   Yes [provider]  levothyroxine (SYNTHROID) 25 MCG tablet Take 25 mcg by mouth daily before breakfast.   Yes [provider]  memantine (NAMENDA) 10 MG tablet Take 10 mg by mouth 2 (two) times daily.   Yes [provider]  polyethylene glycol powder (GLYCOLAX/MIRALAX) 17 GM/SCOOP powder Take 17 g by mouth See admin instructions. Mix 17 grams of powder into 8 ounces of fluid and drink once a day   Yes [provider]  predniSONE (DELTASONE) 10 MG tablet Take  10-40 mg by mouth daily with breakfast. Take 40 mg by mouth once a day FOR COPD for 2 days, then 20 mg once a day for 2 days, then 10 mg once a day for 3 days, then d/c 12/19/22 12/26/22 Yes [provider]  senna-docusate (SENOKOT-S) 8.6-50 MG tablet Take 1 tablet by mouth 2 (two) times daily.   Yes [provider]  spironolactone (ALDACTONE) 25 MG tablet Take 25 mg by mouth daily. 09/22/21  Yes [provider]      Allergies    Codeine, Morphine and related, Sulfa antibiotics, and  Ceftaroline    Review of Systems   Review of Systems  Unable to perform ROS: Dementia  Respiratory:  Positive for shortness of breath.     Physical Exam Updated Vital Signs BP (!) 100/58 (BP Location: Left Arm)   Pulse 61   Temp 97.6 F (36.4 C) (Axillary)   Resp (!) 28   SpO2 99%  Physical Exam Vitals and nursing note reviewed.  Constitutional:      General: She is not in acute distress.    Appearance: She is well-developed.  HENT:     Head: Normocephalic and atraumatic.  Eyes:     Conjunctiva/sclera: Conjunctivae normal.  Cardiovascular:     Rate and Rhythm: Normal rate and regular rhythm.     Heart sounds: No murmur heard. Pulmonary:     Effort: Pulmonary effort is normal. No respiratory distress.     Breath sounds: Wheezing and rhonchi present.  Abdominal:     Palpations: Abdomen is soft.     Tenderness: There is no abdominal tenderness.  Musculoskeletal:        General: No swelling.     Cervical back: Neck supple.  Skin:    General: Skin is warm and dry.     Capillary Refill: Capillary refill takes less than 2 seconds.  Neurological:     Mental Status: She is alert.  Psychiatric:        Mood and Affect: Mood normal.     ED Results / Procedures / Treatments   Labs (all labs ordered are listed, but only abnormal results are displayed) Labs Reviewed  RESP PANEL BY RT-PCR (RSV, FLU A&B, COVID)  RVPGX2 - Abnormal; Notable for the following components:      Result Value   Resp Syncytial Virus by PCR POSITIVE (*)    All other components within normal limits  COMPREHENSIVE METABOLIC PANEL - Abnormal; Notable for the following components:   Potassium 2.8 (*)    CO2 20 (*)    Glucose, Bld 186 (*)    BUN 38 (*)    Creatinine, Ser 1.24 (*)    Calcium 8.1 (*)    Total Bilirubin 1.5 (*)    GFR, Estimated 42 (*)    All other components within normal limits  BRAIN NATRIURETIC PEPTIDE - Abnormal; Notable for the following components:   B Natriuretic Peptide  1,536.3 (*)    All other components within normal limits  CBC WITH DIFFERENTIAL/PLATELET - Abnormal; Notable for the following components:   WBC 13.5 (*)    MCV 106.4 (*)    MCH 34.3 (*)    Platelets 138 (*)    Neutro Abs 12.8 (*)    Lymphs Abs 0.3 (*)    Abs Immature Granulocytes 0.08 (*)    All other components within normal limits  TROPONIN I (HIGH SENSITIVITY) - Abnormal; Notable for the following components:   Troponin I (High Sensitivity) 266 (*)  All other components within normal limits  TROPONIN I (HIGH SENSITIVITY) - Abnormal; Notable for the following components:   Troponin I (High Sensitivity) 290 (*)    All other components within normal limits  TROPONIN I (HIGH SENSITIVITY) - Abnormal; Notable for the following components:   Troponin I (High Sensitivity) 288 (*)    All other components within normal limits  MAGNESIUM  TROPONIN I (HIGH SENSITIVITY)    EKG EKG Interpretation  Date/Time:  Monday December 19 2022 16:33:45 EST Ventricular Rate:  61 PR Interval:  55 QRS Duration: 205 QT Interval:  493 QTC Calculation: 497 R Axis:   -56 Text Interpretation: VENTRICULAR PACED RHYTHM Borderline prolonged QT interval No significant change since last tracing Confirmed by Regan Lemming ([id number]) on 12/19/2022 4:38:52 PM  Radiology CT Angio Chest PE W and/or Wo Contrast  Result Date: 12/19/2022 CLINICAL DATA:  Shortness of breath, hypoxia, high clinical suspicion for pulmonary embolism EXAM: CT ANGIOGRAPHY CHEST WITH CONTRAST TECHNIQUE: Multidetector CT imaging of the chest was performed using the standard protocol during bolus administration of intravenous contrast. Multiplanar CT image reconstructions and MIPs were obtained to evaluate the vascular anatomy. RADIATION DOSE REDUCTION: This exam was performed according to the departmental dose-optimization program which includes automated exposure control, adjustment of the mA and/or kV according to patient size and/or use of  iterative reconstruction technique. CONTRAST:  75m OMNIPAQUE IOHEXOL 350 MG/ML SOLN COMPARISON:  Previous CT done on 03/19/2021, chest radiograph done earlier today FINDINGS: Cardiovascular: There are no intraluminal filling defects in pulmonary artery branches. There is ectasia of main pulmonary artery measuring 4.3 cm. Contrast density in thoracic aorta is less than adequate to evaluate the lumen. There is reflux of contrast into hepatic veins suggesting right heart dysfunction. Scattered calcifications are seen in thoracic aorta and coronary arteries. Dense calcification is seen in mitral annulus. Pacemaker battery is seen in right infraclavicular region. Mediastinum/Nodes: No significant lymphadenopathy seen. Lungs/Pleura: Increase in AP diameter of chest suggests COPD. There is interval clearing of patchy ground-glass densities in both lungs. New patchy alveolar infiltrate is seen in right upper lobe, right lower lobe, right middle lobe, left upper lobe and left lower lobe. There is no pleural effusion or pneumothorax. Upper Abdomen: No acute findings are seen. Scattered diverticula are seen in visualized portions of splenic flexure. Musculoskeletal: There is surgical fusion at C6-C7 level in cervical spine. Review of the MIP images confirms the above findings. IMPRESSION: There is no evidence of pulmonary artery embolism. There is ectasia of main pulmonary artery suggesting pulmonary arterial hypertension. COPD. There are small patchy infiltrates in both lungs, more so in the right lung suggesting multifocal pneumonia. Aortic atherosclerosis. Coronary artery calcifications are seen. There is dense calcification in mitral annulus. Electronically Signed   By: PElmer PickerM.D.   On: 12/19/2022 19:56   DG Chest Port 1 View  Result Date: 12/19/2022 CLINICAL DATA:  Shortness of breath EXAM: PORTABLE CHEST 1 VIEW COMPARISON:  Radiograph March 19, 2021. FINDINGS: Patient is rotated. Right chest cardiac  pacemaker with leads projecting over the right atrium and right ventricle. Prior median sternotomy and CABG. Similar cardiac enlargement. Calcifications of the mitral annulus. Aortic atherosclerosis with unfolding of the aorta. Right hemithorax is obscured by mediastinal structures secondary to patient rotation. No focal airspace consolidation within the visualized portions of the right lung. Left lung is clear. No visible pleural effusion or pneumothorax. No acute osseous abnormality. Surgical clips overlie the inferior chest. Cervical fusion hardware. IMPRESSION: Right hemithorax is  obscured by mediastinal structures secondary to patient rotation. No focal airspace consolidation within the visualized portions of the lung. Electronically Signed   By: Dahlia Bailiff M.D.   On: 12/19/2022 16:37    Procedures Procedures    Medications Ordered in ED Medications  potassium chloride 10 mEq in 100 mL IVPB (10 mEq Intravenous New Bag/Given 12/19/22 2124)  potassium chloride SA (KLOR-CON M) CR tablet 40 mEq (40 mEq Oral Not Given 12/19/22 1854)  levofloxacin (LEVAQUIN) IVPB 750 mg (has no administration in time range)  furosemide (LASIX) injection 40 mg (40 mg Intravenous Given 12/19/22 2001)  iohexol (OMNIPAQUE) 350 MG/ML injection 75 mL (75 mLs Intravenous Contrast Given 12/19/22 1922)  sodium chloride (PF) 0.9 % injection (10 mLs  Given 12/19/22 2002)    ED Course/ Medical Decision Making/ A&P Clinical Course as of 12/19/22 2218  Mon Dec 19, 2022  1808 Respiratory Syncytial Virus by PCR(!): POSITIVE [JL]  1808 Troponin I (High Sensitivity)(!!): 266 [JL]  1808 B Natriuretic Peptide(!): 1,536.3 [JL]  1952 Potassium(!): 2.8 [JL]    Clinical Course User Index [JL] Regan Lemming, MD                           Medical Decision Making Amount and/or Complexity of Data Reviewed Labs: ordered. Decision-making details documented in ED Course. Radiology: ordered.  Risk Prescription drug management. Decision  regarding hospitalization.    89 year old female with medical history significant for dementia with DNR in place, complete heart block status post PPM, CHF with diastolic dysfunction, COPD, CAD, CKD stage III, hypothyroidism who presents to the emergency department with shortness of breath.  She presents from [facility] for shortness of breath for the past 2 days.  It was noted that the patient was 70% on room air.  She was administered to albuterol nebulizer this was brought up to 90% and she was placed on oxygen.  She had little improvement throughout the day today and so EMS was called for transport to the emergency department for further evaluation.  Per facility staff, the patient was at her baseline mental status.  Her room air sats were noted to be 86% and so she was administered 2 DuoNebs with EMS, 10 of albuterol and 1 of Atrovent, wheezes were noted in multiple lung fields with rhonchi present.  She was administered 125 mg of Solu-Medrol in addition to 200 cc of normal saline.  CBG with EMS was noted to be 150.  On arrival, the patient was afebrile, hemodynamically stable, paced rhythm noted on cardiac telemetry, saturating 100% on 3 L O2 via nasal cannula.  Remainder of history of present illness limited by the patient's dementia although she does state that she has been short of breath lately. She denies any chest pain.  On arrival, initial EKG revealed a ventricular paced rhythm, ventricular rate 61, borderline prolonged QTc, no significant changes from prior EKGs.  Both wheezing and rhonchi noted on initial physical exam.  Chest x-ray was performed which revealed a right hemithorax that was obscured by the mediastinum secondary to rotation with no visualized focal airspace consolidation.  The patient's [id number]-06, influenza and RSV PCR testing was collected and resulted positive for RSV.  CMP resulted positive for hypokalemia to 2.8, evidence of mildly elevated serum creatinine to 1.24,  hyperglycemia to 186, not anion gap acidosis with a bicarb of 20, anion gap of 14, otherwise generally unremarkable, magnesium 2.2, CBC with a leukocytosis of 13.5, no  anemia, BNP was elevated to 1536 which appears to be chronically elevated however given the patient's hypoxia could be due to a component of CHF and volume overload.  The patient's initial troponin was elevated to 266, slightly flat but climbing to 298.  She denies any chest pain and has no evidence of STEMI on EKG.  Low concern for ACS at this time.  A CTA PE study was ordered to further evaluate given the patient's hypoxia.  CTA PE imaging was negative for PE, evidence of pulmonary arterial hypertension present, no focal consolidation.  IMPRESSION:  There is no evidence of pulmonary artery embolism. There is ectasia  of main pulmonary artery suggesting pulmonary arterial hypertension.    COPD. There are small patchy infiltrates in both lungs, more so in  the right lung suggesting multifocal pneumonia.    Aortic atherosclerosis. Coronary artery calcifications are seen.  There is dense calcification in mitral annulus.   Patchy infiltrates could be due to the patient's RSV infection or slight pulmonary edema from volume overload.  Her potassium was replenished orally and IV.  She was administered 40 mg of IV Lasix.  She remains maintaining oxygen saturations on 3 L O2 via nasal cannula.  Patient's only contact listed in the chart was a Teddie Curd who was called and updated regarding her condition and plan for likely inpatient hospitalization.  With her leukocytosis and multifocal pneumonia, she was covered for community-acquired bacterial pneumonia superimposed with IV Levaquin given a documented cephalosporin allergy.  Hospitalist medicine was consulted for admission, Dr. Flossie Buffy accepting the patient in admission.   Final Clinical Impression(s) / ED Diagnoses Final diagnoses:  Hypoxia  Acute congestive heart failure, unspecified  heart failure type (HCC)  RSV (respiratory syncytial virus pneumonia)  Acute respiratory failure with hypoxia (HCC)  Hypokalemia  Pulmonary hypertension ([place])    Rx / DC Orders ED Discharge Orders     None             Regan Lemming, MD 12/19/22 2220

## 2022-12-19 NOTE — Assessment & Plan Note (Signed)
-  creatinine of 1.24 from prior of around 1 -given Lasix initially in ED. Will bolus 500cc NS and holding lasix -follow repeat in the morning

## 2022-12-19 NOTE — H&P (Signed)
History and Physical    Patient: [name] YQM:[id number] DOB: Mar 26, 1931 DOA: 12/19/2022 DOS: the patient was seen and examined on 12/19/2022 PCP: Donnajean Lopes, MD  Patient coming from: SNF  Chief Complaint:  Chief Complaint  Patient presents with   Shortness of Breath   HPI: [name] is a 89 y.o. 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   Pt alert and oriented only to self. Hx obtained from ED documentation and ED physician.  Pt sent in from [place] home for shortness of breath since yesterday. She was hypoxic to 70% and given 2 albuterol nebs with improvement to 90% and placed on oxygen. Had little improvement today so EMS was called. Noted to be 89% on room air with wheezing, administed 2 duoneb, '125mg'$  solu medrol and IV fluids.   In the ED, she is afrebile, tachypneic on 3L [place]. RSV+.   BNP elevated at 1536. Troponin elevated at 266-290.  EKG on my review with ventricular paced rhythm not significantly changed from prior.  WBC of 13.5. Hgb of 13.   Hypokalemia of 2.8 with creatinine of 1.24. CBG of 186.   CTA chest negative for PE but concerning for multifocal pneumonia.   She was administed IV '40mg'$  Lasix and potassium in ED. Hospitalist called for further manangment.      Review of Systems: unable to review all systems due to the inability of the patient to answer questions. Past Medical History:  Diagnosis Date   Acute on chronic diastolic CHF (congestive heart failure) ([place]) 05/18/2015   Acute respiratory failure (HCC) 06/09/2016    Anemia    Arthritis    Arthritis pain    Asthma    Blood transfusion    Bronchitis    Cancer (HCC)    lt breast   CHF (congestive heart failure) (HCC)    Constipation    COPD (chronic obstructive pulmonary disease) ([place])    Coronary artery disease    Current use of long term anticoagulation    Cystitis    Dementia ([place])    Encounter for care of pacemaker 06/13/2019   Heart block    Complete heart block post surgical requiring pacer   Humerus shaft fracture    nonsurgical, June 2017   Hypertr obst cardiomyop    Required septal myotomy   Hypokalemia    Postoperative, improved   Hyponatremia    Mild postoperative, improved   Memory difficulty    Mitral regurgitation    Pacemaker    Renal disorder    Stage III   Thyroid disease    hypothyroidism   Urinary incontinence    Past Surgical History:  Procedure Laterality Date   ABDOMINAL HYSTERECTOMY     ABOVE KNEE LEG AMPUTATION Right    Anterior cervical diskectomy and fusion     C-7   BELOW KNEE LEG AMPUTATION Right    BREAST BIOPSY     Left, negative   BREAST LUMPECTOMY  2012   left breast   cardiomyopathy     COLONOSCOPY     FEMUR FRACTURE SURGERY     Right   FRACTURE SURGERY  JOINT REPLACEMENT     ORIF ELBOW FRACTURE Right 04/08/2018   Procedure: OPEN REDUCTION INTERNAL FIXATION (ORIF) ELBOW/OLECRANON FRACTURE;  Surgeon: Renette Butters, MD;  Location: [facility];  Service: Orthopedics;  Laterality: Right;   PACEMAKER PLACEMENT  2006/11-27-2013   MDT dual chamber pacemaker implanted 2006 with gen change by Dr Lovena Le 11-27-2013   PERMANENT PACEMAKER GENERATOR CHANGE N/A 11/27/2013   Procedure: PERMANENT PACEMAKER GENERATOR CHANGE;  Surgeon: Evans Lance, MD;  Location: [facility] CATH LAB;  Service: Cardiovascular;  Laterality: N/A;   REPLACEMENT TOTAL KNEE BILATERAL     Septal myotomy     SHOULDER SURGERY     Right   WRIST FRACTURE SURGERY     Right   Social History:  reports that she has never smoked. She has  never used smokeless tobacco. She reports that she does not currently use alcohol. She reports that she does not use drugs.  Allergies  Allergen Reactions   Codeine Nausea And Vomiting   Morphine And Related Nausea And Vomiting   Sulfa Antibiotics Nausea And Vomiting   Ceftaroline Rash    Family History  Problem Relation Age of Onset   Heart disease Mother    Hypertension Mother    Pancreatic cancer Mother    Cancer Mother        pancreatic   Pancreatic cancer Father    Cancer Father        pancreatic   Diabetes Brother    Pancreatic cancer Other    Heart disease Brother        Enlarged heart    Prior to Admission medications   Medication Sig Start Date End Date Taking? Authorizing Provider  allopurinol (ZYLOPRIM) 100 MG tablet Take 100 mg by mouth daily. 09/13/21   [provider]  apixaban (ELIQUIS) 2.5 MG TABS tablet Take 1 tablet (2.5 mg total) by mouth 2 (two) times daily. 10/01/14   Angiulli, Lavon Paganini, PA-C  cyanocobalamin 1000 MCG tablet Take 1,000 mcg by mouth daily.    [provider]  escitalopram (LEXAPRO) 10 MG tablet Take 10 mg by mouth daily. 09/15/21   [provider]  gabapentin (NEURONTIN) 100 MG capsule Take 200 mg by mouth 2 (two) times daily. 12/27/18   [provider]  levothyroxine (SYNTHROID) 25 MCG tablet Take 25 mcg by mouth daily before breakfast.    [provider]  memantine (NAMENDA) 5 MG tablet Take 5 mg by mouth 2 (two) times daily. 09/27/21   [provider]  senna-docusate (SENOKOT-S) 8.6-50 MG tablet Take 1 tablet by mouth daily.    [provider]  spironolactone (ALDACTONE) 25 MG tablet Take 0.5 tablets by mouth daily. 09/22/21   [provider]    Physical Exam: Vitals:   12/19/22 2005 12/19/22 2100 12/19/22 2126 12/19/22 2200  BP: 109/65 112/62  (!) 100/58  Pulse: 62 (!) 59  61  Resp: (!) 26 (!) 23  (!) 28  Temp:   97.6 F (36.4 C)   TempSrc:   Axillary   SpO2: 99%  100%  99%   Constitutional: NAD, calm, comfortable, thin cachectic chronically ill-appearing female laying approximately 30 degree incline in bed asleep.  Awoke easily to voice. Eyes: lids and conjunctivae normal ENMT: Mucous membranes are moist.  Neck: normal, supple Respiratory: clear to auscultation bilaterally, no wheezing, no crackles. Normal respiratory effort. No accessory muscle use.  On 3 L via nasal cannula. Cardiovascular: Regular rate and rhythm, no murmurs / rubs / gallops. No  extremity edema. Abdomen: no tenderness, no masses palpated. No hepatosplenomegaly. Bowel sounds positive.  Musculoskeletal: no clubbing / cyanosis.  Right AKA..  Muscle wasting on all extremities.   Skin: Scattered seborrheic keratosis. No pressure ulcers.  Neurologic: CN 2-12 grossly intact. Alert and oriented only to self. Able to turn in bed with minimal assistance.   Psychiatric:  Normal mood. Data Reviewed:  See HPI   Assessment and Plan: * Acute respiratory failure with hypoxia ([place]) -secondary to RSV pneumonia vs superimposed bacterial pneumonia requiring 3L -she is RSV positive with findings of multifocal pneumonia seen on CTA chest -Started on Levaquin in ED. Will check procalcitonin -wean O2 as tolerated with goal of >92%  Hypokalemia -potassium of 2.8 -repleted with IV 20mq x3 and oral potassium  Acute kidney injury superimposed on chronic kidney disease (HCC) -creatinine of 1.24 from prior of around 1 -given Lasix initially in ED. Will bolus 500cc NS and holding lasix -follow repeat in the morning  Chronic combined systolic and diastolic CHF (congestive heart failure) ([place] -Last echocardiogram on 03/2021 with EF of 50 to 55% with severe concentric LVH, severely dilated left and right atrium, moderate to severe mitral valve regurgitation with severe mitral annular calcification.  Severe tricuspid valve regurgitation. -BNP is elevated to 1536 but clinically appears dry and has AKI. BNP  appears chronically elevated. -she was administered IV '40mg'$  of Lasix in ED. Will give IV 500 bolus and hold any further Lasix for now.  Atrial fibrillation (HCC) -rate controlled with pacemaker -continue Eliquis  History of heart block S/p pacemaker      Advance Care Planning: DNR-legal documentation at bedside  Consults: none  Family Communication: none at bedside  Severity of Illness: The appropriate patient status for this patient is INPATIENT. Inpatient status is judged to be reasonable and necessary in order to provide the required intensity of service to ensure the patient's safety. The patient's presenting symptoms, physical exam findings, and initial radiographic and laboratory data in the context of their chronic comorbidities is felt to place them at high risk for further clinical deterioration. Furthermore, it is not anticipated that the patient will be medically stable for discharge from the hospital within 2 midnights of admission.   * I certify that at the point of admission it is my clinical judgment that the patient will require inpatient hospital care spanning beyond 2 midnights from the point of admission due to high intensity of service, high risk for further deterioration and high frequency of surveillance required.*  Author: COrene Desanctis DO 12/19/2022 10:50 PM  For on call review [email or web address]

## 2022-12-19 NOTE — Assessment & Plan Note (Signed)
-  potassium of 2.8 -repleted with IV 26mq x3 and oral potassium

## 2022-12-19 NOTE — ED Triage Notes (Signed)
Pt BIBA from [facility] for [facility] since yesterday. Yesterday facility noted pt to be 70% on room air, gave her 2 albuterol nebs which brought her up to 90% so they put her on oxygen. Little improvement today so called EMS. Alert to self, is at baseline. EMS noted RA sats to be 86% and dropping, gave 2 duoneb 10 albuterol 1 atrovent. Wheezes in ULL cleared, LLL normal, R side from rhonchi to wheezes and rhonchi. '125mg'$  solumedrol. 20ga LF. 200NS.  114/70 Paced HR 85-100 etCO2 17 RR 28 CBG 150

## 2022-12-20 DIAGNOSIS — J449 Chronic obstructive pulmonary disease, unspecified: Secondary | ICD-10-CM | POA: Insufficient documentation

## 2022-12-20 DIAGNOSIS — J9601 Acute respiratory failure with hypoxia: Secondary | ICD-10-CM | POA: Diagnosis not present

## 2022-12-20 LAB — CBC
HCT: 38.3 % (ref 36.0–46.0)
Hemoglobin: 12.4 g/dL (ref 12.0–15.0)
MCH: 34.2 pg — ABNORMAL HIGH (ref 26.0–34.0)
MCHC: 32.4 g/dL (ref 30.0–36.0)
MCV: 105.5 fL — ABNORMAL HIGH (ref 80.0–100.0)
Platelets: 127 10*3/uL — ABNORMAL LOW (ref 150–400)
RBC: 3.63 MIL/uL — ABNORMAL LOW (ref 3.87–5.11)
RDW: 14.6 % (ref 11.5–15.5)
WBC: 10.3 10*3/uL (ref 4.0–10.5)
nRBC: 0 % (ref 0.0–0.2)

## 2022-12-20 LAB — BASIC METABOLIC PANEL
Anion gap: 10 (ref 5–15)
BUN: 38 mg/dL — ABNORMAL HIGH (ref 8–23)
CO2: 22 mmol/L (ref 22–32)
Calcium: 7.5 mg/dL — ABNORMAL LOW (ref 8.9–10.3)
Chloride: 109 mmol/L (ref 98–111)
Creatinine, Ser: 1.02 mg/dL — ABNORMAL HIGH (ref 0.44–1.00)
GFR, Estimated: 53 mL/min — ABNORMAL LOW (ref >60)
Glucose, Bld: 139 mg/dL — ABNORMAL HIGH (ref 70–99)
Potassium: 3.4 mmol/L — ABNORMAL LOW (ref 3.5–5.1)
Sodium: 141 mmol/L (ref 135–145)

## 2022-12-20 LAB — STREP PNEUMONIAE URINARY ANTIGEN: Strep Pneumo Urinary Antigen: POSITIVE — AB

## 2022-12-20 LAB — PROCALCITONIN: Procalcitonin: 0.53 ng/mL

## 2022-12-20 MED ORDER — LEVOFLOXACIN IN D5W 750 MG/150ML IV SOLN: 750.0000 mg | INTRAVENOUS | Status: DC

## 2022-12-20 MED ORDER — PREDNISONE 20 MG PO TABS
40.0000 mg | ORAL_TABLET | Freq: Every day | ORAL | Status: DC
  Administered 2022-12-20 – 2022-12-22 (×3): 40 mg via ORAL
  Filled 2022-12-20 (×4): qty 2

## 2022-12-20 MED ORDER — SODIUM CHLORIDE 0.9 % IV SOLN
1.0000 g | INTRAVENOUS | Status: DC
  Administered 2022-12-20: 1 g via INTRAVENOUS
  Filled 2022-12-20 (×2): qty 10

## 2022-12-20 MED ORDER — LACTULOSE 10 GM/15ML PO SOLN
20.0000 g | Freq: Every day | ORAL | Status: DC
  Administered 2022-12-20 – 2022-12-22 (×3): 20 g via ORAL
  Filled 2022-12-20 (×3): qty 30

## 2022-12-20 MED ORDER — AZITHROMYCIN 250 MG PO TABS
500.0000 mg | ORAL_TABLET | Freq: Every day | ORAL | Status: DC
  Administered 2022-12-20: 500 mg via ORAL
  Filled 2022-12-20: qty 2

## 2022-12-20 MED ORDER — IPRATROPIUM-ALBUTEROL 0.5-2.5 (3) MG/3ML IN SOLN
3.0000 mL | Freq: Four times a day (QID) | RESPIRATORY_TRACT | Status: DC
  Administered 2022-12-20 (×3): 3 mL via RESPIRATORY_TRACT
  Filled 2022-12-20 (×3): qty 3

## 2022-12-20 MED ORDER — LATANOPROST 0.005 % OP SOLN
1.0000 [drp] | Freq: Every day | OPHTHALMIC | Status: DC
  Administered 2022-12-20 – 2022-12-21 (×2): 1 [drp] via OPHTHALMIC
  Filled 2022-12-20: qty 2.5

## 2022-12-20 MED ORDER — POTASSIUM CHLORIDE CRYS ER 20 MEQ PO TBCR
40.0000 meq | EXTENDED_RELEASE_TABLET | Freq: Once | ORAL | Status: AC
  Administered 2022-12-20: 40 meq via ORAL
  Filled 2022-12-20: qty 2

## 2022-12-20 MED ORDER — IPRATROPIUM-ALBUTEROL 0.5-2.5 (3) MG/3ML IN SOLN
3.0000 mL | Freq: Three times a day (TID) | RESPIRATORY_TRACT | Status: DC
  Administered 2022-12-21 – 2022-12-22 (×5): 3 mL via RESPIRATORY_TRACT
  Filled 2022-12-20 (×5): qty 3

## 2022-12-20 NOTE — Progress Notes (Signed)
PHARMACY NOTE:  ANTIMICROBIAL RENAL DOSAGE ADJUSTMENT  Current antimicrobial regimen includes a mismatch between antimicrobial dosage and estimated renal function.  As per policy approved by the Pharmacy & Therapeutics and Medical Executive Committees, the antimicrobial dosage will be adjusted accordingly.  Current antimicrobial dosage:  Levaquin '750mg'$  IV q24h  Indication: CAP  Renal Function:  SCr = 1.24 Height = 63" Weight = 49kg (Oct 2023) CrCl ~ 23 ml/min  Antimicrobial dosage has been changed to:  Levaquin '750mg'$  IV q48h   Thank you for allowing pharmacy to be a part of this patient's care.  Everette Rank, [facility] 12/20/2022 1:41 AM

## 2022-12-20 NOTE — ED Notes (Signed)
Bair Hugger put on patient.

## 2022-12-20 NOTE — Progress Notes (Addendum)
PROGRESS NOTE    [name]  TZG:[id number] DOB: 08-Oct-1931 DOA: 12/19/2022 PCP: Donnajean Lopes, MD  Outpatient Specialists: cardiology    Brief Narrative:   From admission h and p  [name] is a 89 y.o. 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    Pt alert and oriented only to self. Hx obtained from ED documentation and ED physician.  Pt sent in from [facility] home for shortness of breath since yesterday. She was hypoxic to 70% and given 2 albuterol nebs with improvement to 90% and placed on oxygen. Had little improvement today so EMS was called. Noted to be 89% on room air with wheezing, administed 2 duoneb, '125mg'$  solu medrol and IV fluids.    In the ED, she is afrebile, tachypneic on 3L [place]. RSV+.    BNP elevated at 1536. Troponin elevated at 266-290.  EKG on my review with ventricular paced rhythm not significantly changed from prior.   WBC of 13.5. Hgb of 13.    Hypokalemia of 2.8 with creatinine of 1.24. CBG of 186.    CTA chest negative for PE but concerning for multifocal pneumonia.    She was administed IV '40mg'$  Lasix and potassium in ED. Hospitalist called for further manangment.   Assessment & Plan:   Principal Problem:   Acute respiratory failure with hypoxia [facility]) Active Problems:   History of heart block   Asthma   Pacemaker Medtronic Adapta dual-chamber pacemaker 2006, generator change 11/27/2013, programmed to VVI  Right infracla. Fossa   Atrial fibrillation (HCC)   Hx of right BKA (HCC)   Essential hypertension    Chronic kidney disease, stage 3a (HCC)   Chronic combined systolic and diastolic CHF (congestive heart failure) (HCC)   Hypothyroidism   RSV (respiratory syncytial virus pneumonia)   Acute kidney injury superimposed on chronic kidney disease (HCC)   Hypokalemia  # RSV pneumonia - supportive care as below  # Acute hypoxic respiratory failure O2 70s per ems, 2/2 rsv pneumonia, possible bacterial infection, and asthma/copd exacerbation. Currently off o2 and satting 94% - monitor  # CAP # COPD with exacerbation CTA no PE but does show signs multifocal pneumonia. Wheezing on exam. Procal is elevated - will continue abx (levaquin>ceftriaxone/azithromycin) - start prednisone - start duonebs - f/u urine strep and legionella antigens - sputum for culture if able to provide  # Hypokalemia Repleted and improved to 3.4 today - replete again  # HFpEF Bnp elevated but no clinical signs of exacerbation and indeed appears a little dry - cont to hold home spiro and lasix, resume when able  # Tropinemia Trops 200s, stable, no chest pain or EKG signs of ischemia - monitor  # Thrombocytopenia Mild, likely 2/2 infection - monitor for now  # Dementia - cont memantine  # Hypothyroid - cont levothyroxine  # A-fib - cont home apixaban  # Gout - cont home allopurinol  # Chronic constipation - home lactulose  # Chronic pain - home gabapentin  # MDD - home lexapro  # Heart block - has pacer  # Right BKA noted  DVT prophylaxis: home apixaban Code Status: DNR Family Communication:  niece updated telephonically 1/2  Level of care: Telemetry Status is: Inpatient    Consultants:  none  Procedures: none  Antimicrobials:  levofloxacin    Subjective: No complaints, denies dyspnea or chest pain  Objective: Vitals:   12/20/22 0400 12/20/22 0501 12/20/22 0600 12/20/22 0700  BP: (!) 96/58 (!) 100/52 (!) 101/55 110/62  Pulse: 60 77 68 62  Resp: (!) 22 (!) 24 (!) 21  (!) 29  Temp:  (!) 97.5 F (36.4 C)    TempSrc:  Rectal    SpO2: 92% 99% 99% 100%    Intake/Output Summary (Last 24 hours) at 12/20/2022 0854 Last data filed at 12/20/2022 0144 Gross per 24 hour  Intake 1089.96 ml  Output --  Net 1089.96 ml   There were no vitals filed for this visit.  Examination:  General exam: Appears calm and comfortable  Respiratory system: wheeze throughout, scattered rhonchi/rales Cardiovascular system: S1 & S2 heard, rrr, systolic murmur Gastrointestinal system: Abdomen is nondistended, soft and nontender. No organomegaly or masses felt. Normal bowel sounds heard. Central nervous system: Alert, moving all 4 Extremities: no edema, right bka Skin: warm Psychiatry: confused, calm    Data Reviewed: I have personally reviewed following labs and imaging studies  CBC: Recent Labs  Lab 12/19/22 1605 12/20/22 0500  WBC 13.5* 10.3  NEUTROABS 12.8*  --   HGB 13.3 12.4  HCT 41.3 38.3  MCV 106.4* 105.5*  PLT 138* 474*   Basic Metabolic Panel: Recent Labs  Lab 12/19/22 1605 12/19/22 1805 12/20/22 0500  NA 143  --  141  K 2.8*  --  3.4*  CL 109  --  109  CO2 20*  --  22  GLUCOSE 186*  --  139*  BUN 38*  --  38*  CREATININE 1.24*  --  1.02*  CALCIUM 8.1*  --  7.5*  MG  --  2.2  --    GFR: CrCl cannot be calculated (Unknown ideal weight.). Liver Function Tests: Recent Labs  Lab 12/19/22 1605  AST 28  ALT 15  ALKPHOS 59  BILITOT 1.5*  PROT 6.6  ALBUMIN 3.7   No results for input(s): "LIPASE", "AMYLASE" in the last 168 hours. No results for input(s): "AMMONIA" in the last 168 hours. Coagulation Profile: No results for input(s): "INR", "PROTIME" in the last 168 hours. Cardiac Enzymes: No results for input(s): "CKTOTAL", "CKMB", "CKMBINDEX", "TROPONINI" in the last 168 hours. BNP (last 3 results) No results for input(s): "PROBNP" in the last 8760 hours. HbA1C: No results for input(s): "HGBA1C" in the last 72 hours. CBG: No results for  input(s): "GLUCAP" in the last 168 hours. Lipid Profile: No results for input(s): "CHOL", "HDL", "LDLCALC", "TRIG", "CHOLHDL", "LDLDIRECT" in the last 72 hours. Thyroid Function Tests: No results for input(s): "TSH", "T4TOTAL", "FREET4", "T3FREE", "THYROIDAB" in the last 72 hours. Anemia Panel: No results for input(s): "VITAMINB12", "FOLATE", "FERRITIN", "TIBC", "IRON", "RETICCTPCT" in the last 72 hours. Urine analysis:    Component Value Date/Time   COLORURINE YELLOW 10/02/2019 1202   APPEARANCEUR CLEAR 10/02/2019 1202   LABSPEC 1.012 10/02/2019 1202   PHURINE 7.0 10/02/2019 1202   GLUCOSEU NEGATIVE 10/02/2019 1202   HGBUR NEGATIVE 10/02/2019 1202   BILIRUBINUR negative 05/21/2020 1230   KETONESUR negative 05/21/2020 1230   KETONESUR NEGATIVE 10/02/2019 1202   PROTEINUR negative 05/21/2020 1230   PROTEINUR 100 (A) 10/02/2019 1202   UROBILINOGEN 0.2 05/21/2020 1230   UROBILINOGEN 1.0 05/18/2015 0024   NITRITE Negative 05/21/2020 1230   NITRITE  NEGATIVE 10/02/2019 1202   LEUKOCYTESUR Trace (A) 05/21/2020 1230   LEUKOCYTESUR MODERATE (A) 10/02/2019 1202   Sepsis Labs: '@LABRCNTIP'$ (procalcitonin:4,lacticidven:4)  ) Recent Results (from the past 240 hour(s))  Resp panel by RT-PCR (RSV, Flu A&B, Covid) Anterior Nasal Swab     Status: Abnormal   Collection Time: 12/19/22  4:05 PM   Specimen: Anterior Nasal Swab  Result Value Ref Range Status   SARS Coronavirus 2 by RT PCR NEGATIVE NEGATIVE Final    Comment: (NOTE) SARS-CoV-2 target nucleic acids are NOT DETECTED.  The SARS-CoV-2 RNA is generally detectable in upper respiratory specimens during the acute phase of infection. The lowest concentration of SARS-CoV-2 viral copies this assay can detect is 138 copies/mL. A negative result does not preclude SARS-Cov-2 infection and should not be used as the sole basis for treatment or other patient management decisions. A negative result may occur with  improper specimen  collection/handling, submission of specimen other than nasopharyngeal swab, presence of viral mutation(s) within the areas targeted by this assay, and inadequate number of viral copies(<138 copies/mL). A negative result must be combined with clinical observations, patient history, and epidemiological information. The expected result is Negative.  Fact Sheet for Patients:  EntrepreneurPulse.com.au  Fact Sheet for Healthcare Providers:  IncredibleEmployment.be  This test is no t yet approved or cleared by the [place] FDA and  has been authorized for detection and/or diagnosis of SARS-CoV-2 by FDA under an Emergency Use Authorization (EUA). This EUA will remain  in effect (meaning this test can be used) for the duration of the COVID-19 declaration under Section 564(b)(1) of the Act, 21 U.S.C.section 360bbb-3(b)(1), unless the authorization is terminated  or revoked sooner.       Influenza A by PCR NEGATIVE NEGATIVE Final   Influenza B by PCR NEGATIVE NEGATIVE Final    Comment: (NOTE) The Xpert Xpress SARS-CoV-2/FLU/RSV plus assay is intended as an aid in the diagnosis of influenza from Nasopharyngeal swab specimens and should not be used as a sole basis for treatment. Nasal washings and aspirates are unacceptable for Xpert Xpress SARS-CoV-2/FLU/RSV testing.  Fact Sheet for Patients: EntrepreneurPulse.com.au  Fact Sheet for Healthcare Providers: IncredibleEmployment.be  This test is not yet approved or cleared by the [place] FDA and has been authorized for detection and/or diagnosis of SARS-CoV-2 by FDA under an Emergency Use Authorization (EUA). This EUA will remain in effect (meaning this test can be used) for the duration of the COVID-19 declaration under Section 564(b)(1) of the Act, 21 U.S.C. section 360bbb-3(b)(1), unless the authorization is terminated or revoked.     Resp Syncytial  Virus by PCR POSITIVE (A) NEGATIVE Final    Comment: (NOTE) Fact Sheet for Patients: EntrepreneurPulse.com.au  Fact Sheet for Healthcare Providers: IncredibleEmployment.be  This test is not yet approved or cleared by the [place] FDA and has been authorized for detection and/or diagnosis of SARS-CoV-2 by FDA under an Emergency Use Authorization (EUA). This EUA will remain in effect (meaning this test can be used) for the duration of the COVID-19 declaration under Section 564(b)(1) of the Act, 21 U.S.C. section 360bbb-3(b)(1), unless the authorization is terminated or revoked.  Performed at [facility], [place] [address] [place], [place] [address]          Radiology Studies: CT Angio Chest PE W and/or Wo Contrast  Result Date: 12/19/2022 CLINICAL DATA:  Shortness of breath, hypoxia, high clinical suspicion for pulmonary embolism EXAM: CT ANGIOGRAPHY CHEST WITH CONTRAST TECHNIQUE: Multidetector CT imaging of the  chest was performed using the standard protocol during bolus administration of intravenous contrast. Multiplanar CT image reconstructions and MIPs were obtained to evaluate the vascular anatomy. RADIATION DOSE REDUCTION: This exam was performed according to the departmental dose-optimization program which includes automated exposure control, adjustment of the mA and/or kV according to patient size and/or use of iterative reconstruction technique. CONTRAST:  53m OMNIPAQUE IOHEXOL 350 MG/ML SOLN COMPARISON:  Previous CT done on 03/19/2021, chest radiograph done earlier today FINDINGS: Cardiovascular: There are no intraluminal filling defects in pulmonary artery branches. There is ectasia of main pulmonary artery measuring 4.3 cm. Contrast density in thoracic aorta is less than adequate to evaluate the lumen. There is reflux of contrast into hepatic veins suggesting right heart dysfunction. Scattered calcifications are seen in  thoracic aorta and coronary arteries. Dense calcification is seen in mitral annulus. Pacemaker battery is seen in right infraclavicular region. Mediastinum/Nodes: No significant lymphadenopathy seen. Lungs/Pleura: Increase in AP diameter of chest suggests COPD. There is interval clearing of patchy ground-glass densities in both lungs. New patchy alveolar infiltrate is seen in right upper lobe, right lower lobe, right middle lobe, left upper lobe and left lower lobe. There is no pleural effusion or pneumothorax. Upper Abdomen: No acute findings are seen. Scattered diverticula are seen in visualized portions of splenic flexure. Musculoskeletal: There is surgical fusion at C6-C7 level in cervical spine. Review of the MIP images confirms the above findings. IMPRESSION: There is no evidence of pulmonary artery embolism. There is ectasia of main pulmonary artery suggesting pulmonary arterial hypertension. COPD. There are small patchy infiltrates in both lungs, more so in the right lung suggesting multifocal pneumonia. Aortic atherosclerosis. Coronary artery calcifications are seen. There is dense calcification in mitral annulus. Electronically Signed   By: PElmer PickerM.D.   On: 12/19/2022 19:56   DG Chest Port 1 View  Result Date: 12/19/2022 CLINICAL DATA:  Shortness of breath EXAM: PORTABLE CHEST 1 VIEW COMPARISON:  Radiograph March 19, 2021. FINDINGS: Patient is rotated. Right chest cardiac pacemaker with leads projecting over the right atrium and right ventricle. Prior median sternotomy and CABG. Similar cardiac enlargement. Calcifications of the mitral annulus. Aortic atherosclerosis with unfolding of the aorta. Right hemithorax is obscured by mediastinal structures secondary to patient rotation. No focal airspace consolidation within the visualized portions of the right lung. Left lung is clear. No visible pleural effusion or pneumothorax. No acute osseous abnormality. Surgical clips overlie the inferior  chest. Cervical fusion hardware. IMPRESSION: Right hemithorax is obscured by mediastinal structures secondary to patient rotation. No focal airspace consolidation within the visualized portions of the lung. Electronically Signed   By: JDahlia BailiffM.D.   On: 12/19/2022 16:37        Scheduled Meds:  allopurinol  100 mg Oral Daily   apixaban  2.5 mg Oral BID   cyanocobalamin  1,000 mcg Oral Daily   escitalopram  10 mg Oral Daily   gabapentin  200 mg Oral BID   levothyroxine  25 mcg Oral QAC breakfast   memantine  10 mg Oral BID   potassium chloride  40 mEq Oral Once   senna-docusate  1 tablet Oral BID   Continuous Infusions:  [START ON 12/21/2022] levofloxacin (LEVAQUIN) IV       LOS: 1 day     NDesma Maxim MD [facility]   If 7PM-7AM, please contact night-coverage [email or web address] Password [email or web address]1/01/2023, 8:54 AM

## 2022-12-21 DIAGNOSIS — J121 Respiratory syncytial virus pneumonia: Secondary | ICD-10-CM | POA: Diagnosis not present

## 2022-12-21 DIAGNOSIS — E039 Hypothyroidism, unspecified: Secondary | ICD-10-CM | POA: Diagnosis not present

## 2022-12-21 DIAGNOSIS — Z89511 Acquired absence of right leg below knee: Secondary | ICD-10-CM

## 2022-12-21 DIAGNOSIS — J13 Pneumonia due to Streptococcus pneumoniae: Secondary | ICD-10-CM

## 2022-12-21 DIAGNOSIS — J9601 Acute respiratory failure with hypoxia: Secondary | ICD-10-CM | POA: Diagnosis not present

## 2022-12-21 LAB — BASIC METABOLIC PANEL
Anion gap: 9 (ref 5–15)
BUN: 42 mg/dL — ABNORMAL HIGH (ref 8–23)
CO2: 23 mmol/L (ref 22–32)
Calcium: 8.4 mg/dL — ABNORMAL LOW (ref 8.9–10.3)
Chloride: 104 mmol/L (ref 98–111)
Creatinine, Ser: 1.03 mg/dL — ABNORMAL HIGH (ref 0.44–1.00)
GFR, Estimated: 52 mL/min — ABNORMAL LOW (ref >60)
Glucose, Bld: 82 mg/dL (ref 70–99)
Potassium: 4.4 mmol/L (ref 3.5–5.1)
Sodium: 136 mmol/L (ref 135–145)

## 2022-12-21 LAB — CBC
HCT: 39.1 % (ref 36.0–46.0)
Hemoglobin: 12.6 g/dL (ref 12.0–15.0)
MCH: 34.2 pg — ABNORMAL HIGH (ref 26.0–34.0)
MCHC: 32.2 g/dL (ref 30.0–36.0)
MCV: 106.3 fL — ABNORMAL HIGH (ref 80.0–100.0)
Platelets: 122 10*3/uL — ABNORMAL LOW (ref 150–400)
RBC: 3.68 MIL/uL — ABNORMAL LOW (ref 3.87–5.11)
RDW: 14.7 % (ref 11.5–15.5)
WBC: 14.5 10*3/uL — ABNORMAL HIGH (ref 4.0–10.5)
nRBC: 0 % (ref 0.0–0.2)

## 2022-12-21 LAB — LEGIONELLA PNEUMOPHILA SEROGP 1 UR AG: L. pneumophila Serogp 1 Ur Ag: NEGATIVE

## 2022-12-21 LAB — MRSA NEXT GEN BY PCR, NASAL: MRSA by PCR Next Gen: DETECTED — AB

## 2022-12-21 MED ORDER — MUPIROCIN 2 % EX OINT
1.0000 | TOPICAL_OINTMENT | Freq: Two times a day (BID) | CUTANEOUS | Status: DC
  Administered 2022-12-21 – 2022-12-22 (×3): 1 via NASAL
  Filled 2022-12-21: qty 22

## 2022-12-21 MED ORDER — CEFTRIAXONE SODIUM 1 G IJ SOLR: 1.0000 g | Freq: Once | INTRAMUSCULAR | Status: DC

## 2022-12-21 MED ORDER — AMOXICILLIN 500 MG PO CAPS
500.0000 mg | ORAL_CAPSULE | Freq: Three times a day (TID) | ORAL | Status: DC
  Administered 2022-12-21 – 2022-12-22 (×3): 500 mg via ORAL
  Filled 2022-12-21 (×3): qty 1

## 2022-12-21 MED ORDER — AZITHROMYCIN 250 MG PO TABS
250.0000 mg | ORAL_TABLET | Freq: Every day | ORAL | Status: DC
  Administered 2022-12-21: 250 mg via ORAL
  Filled 2022-12-21: qty 1

## 2022-12-21 MED ORDER — CHLORHEXIDINE GLUCONATE CLOTH 2 % EX PADS
6.0000 | MEDICATED_PAD | Freq: Every day | CUTANEOUS | Status: DC
  Administered 2022-12-21 – 2022-12-22 (×2): 6 via TOPICAL

## 2022-12-21 NOTE — Plan of Care (Signed)

## 2022-12-21 NOTE — TOC Initial Note (Addendum)
Transition of Care [facility]) - Initial/Assessment Note    Patient Details  Name: [name] MRN: [id number] Date of Birth: 04-04-1931  Transition of Care [facility]) CM/SW Contact:    Vassie Moselle, LCSW Phone Number: 12/21/2022, 9:20 AM  Clinical Narrative:                 Pt is a current LTC resident at [facility] in [place] [place]. Confirmed pt able to return w/ Linus Orn at [facility].  CSW left voicemail with pt's niece to confirm discharge plans/needs.   Update 9:25am- Received return call from niece who confirms discharge plan for pt to return to [facility] at discharge. Niece is requesting EMS transport at discharge as pt is high fall risk.   Expected Discharge Plan: Long Term Nursing Home Barriers to Discharge: No Barriers Identified   Patient Goals and CMS Choice Patient states their goals for this hospitalization and ongoing recovery are:: For pt to return to [facility] CMS Medicare.gov Compare Post Acute Care list provided to:: Patient Represenative (must comment) (Niece) Choice offered to / list presented to :  (Niece) [place] ownership interest in [facility]provided to::  (NA)    Expected Discharge Plan and Services In-house Referral: NA Discharge Planning Services: NA Post Acute Care Choice: Nursing Home, Resumption of Svcs/PTA Provider Living arrangements for the past 2 months: [place]                 DME Arranged: N/A DME Agency: NA                  Prior Living Arrangements/Services Living arrangements for the past 2 months: [place] Lives with:: Facility Resident Patient language and need for interpreter reviewed:: Yes Do you feel safe going back to the place where you live?: Yes      Need for Family Participation in Patient Care: Yes (Comment) Care giver support system in place?: Yes (comment) Current home services: DME Criminal Activity/Legal Involvement Pertinent to Current Situation/Hospitalization: No - Comment as  needed  Activities of Daily Living      Permission Sought/Granted Permission sought to share information with : Facility Sport and exercise psychologist, Family Supports    Share Information with NAME: Adilynne Fitzwater  Permission granted to share info w AGENCY: [facility]  Permission granted to share info w Relationship: Niece  Permission granted to share info w Contact Information: [phone]  Emotional Assessment   Attitude/Demeanor/Rapport: Unable to Assess Affect (typically observed): Unable to Assess Orientation: : Oriented to Self Alcohol / Substance Use: Not Applicable Psych Involvement: No (comment)  Admission diagnosis:  Hypokalemia [E87.6] Hypoxia [R09.02] RSV (respiratory syncytial virus pneumonia) [J12.1] Pulmonary hypertension (HCC) [I27.20] Acute respiratory failure with hypoxia (HCC) [J96.01] Acute congestive heart failure, unspecified heart failure type ([place]) [I50.9] Patient Active Problem List   Diagnosis Date Noted   Chronic obstructive pulmonary disease ([place]) 12/20/2022   RSV (respiratory syncytial virus pneumonia) 12/19/2022   Acute kidney injury superimposed on chronic kidney disease ([place]) 12/19/2022   Hypokalemia 12/19/2022   Acute respiratory failure with hypoxia ([place]) 12/19/2022   Chronic combined systolic and diastolic CHF (congestive heart failure) ([place]) 03/19/2021   Hypothyroidism 03/19/2021   Encephalopathy 10/02/2019   Elevated troponin 10/02/2019   AMS (altered mental status) 10/02/2019   Heart block    Encounter for care of pacemaker 06/13/2019   Right carotid bruit 02/18/2019   Chronic kidney disease, stage 3a ([place]) 01/29/2019   Hypoglycemia 01/29/2019   CHF (congestive heart failure) ([place])  01/28/2019   Rt Olecranon fracture 04/07/2018   Essential hypertension 04/07/2018   Rt Hip Fracture ([place]) 04/06/2018   Obstructive lung disease (generalized) ([place]) 10/04/2017   Humerus fracture 06/09/2016   Hx of right BKA ([place]) 06/09/2016   Acute hypoxic  respiratory failure ([place]) 06/09/2016   Malnutrition of moderate degree 06/09/2016   Unilateral AKA ([place]) 09/23/2014   Chronic atrial fibrillation ([place]) 08/12/2014   Pulmonary hypertension, unspecified ([place]) 08/12/2014   Hypertension 01/17/2014   Cardiac pacemaker in situ 01/17/2014   Heart block AV complete ([place]) 07/03/2013   Atrial fibrillation ([place]) 07/03/2013   Breast cancer, left breast ([place]) 04/24/2013   Other symptoms involving cardiovascular system 12/22/2010   MITRAL REGURGITATION 06/17/2010   HOCM (hypertrophic obstructive cardiomyopathy) ([place]) 06/17/2010   History of heart block 06/17/2010   HEMORRHOIDS 06/17/2010   BRONCHITIS 06/17/2010   Asthma 06/17/2010   CYSTITIS 06/17/2010   URINARY INCONTINENCE 06/17/2010   Pacemaker Medtronic Adapta dual-chamber pacemaker 2006, generator change 11/27/2013, programmed to VVI  Right infracla. Fossa 06/21/2005   PCP:  Donnajean Lopes, MD Pharmacy:   [facility] - [place], [place] [address] Phone: [phone] Fax: [phone]     Social Determinants of Health (SDOH) Social History: SDOH Screenings   Depression [phone]): Low Risk  (03/04/2019)  Tobacco Use: Low Risk  (12/19/2022)   SDOH Interventions:     Readmission Risk Interventions    12/21/2022    9:17 AM  Readmission Risk Prevention Plan  Transportation Screening Complete  PCP or Specialist Appt within 3-5 Days Complete  HRI or [place] Complete  Social Work Consult for [place] Planning/Counseling Complete  Palliative Care Screening Not Applicable  Medication Review Press photographer) Complete

## 2022-12-21 NOTE — Progress Notes (Signed)
  Progress Note   Patient: [name] [id number]:[id number] DOB: Nov 25, 1931 DOA: 12/19/2022     2 DOS: the patient was seen and examined on 12/21/2022   Brief hospital course: 89 year old woman presented from [facility] home for shortness of breath, found to be hypoxic.  Admitted for RSV acute respiratory illness and Streptococcus pneumoniae pneumonia.  Assessment and Plan: Acute hypoxic respiratory failure secondary to RSV respiratory illness, Strep pneumoniae pneumonia, COPD exacerbation. Acute hypoxia resolved. Continue supportive care for RSV.  Antibiotics for Streptococcus pneumoniae. Continue prednisone, bronchodilators for COPD.   Hypokalemia Resolved.   Chronic diastolic CHF.  Last echo showed moderate to severe mitral valve regurgitation, severe tricuspid valve regurgitation. Appears stable.  Continue to hold spironolactone and furosemide   Elevated troponin Appears to be chronic, seen 09/2019   Thrombocytopenia Secondary to infection. Stable. Monitor.   Dementia Stable. Continue memantine   Hypothyroidism Continue levothyroxine   A-fib, PPM - cont home apixaban   # Chronic pain - home gabapentin   # MDD - home lexapro   Right BKA noted      Subjective:  Feels better but still SOB and not back to baseline.  Physical Exam: Vitals:   12/21/22 0445 12/21/22 0841 12/21/22 1224 12/21/22 1343  BP: 122/81  125/74   Pulse:   65   Resp: (!) 22  20   Temp: 97.6 F (36.4 C)  97.6 F (36.4 C)   TempSrc: Oral  Oral   SpO2: 94% 94% 93% 93%   Physical Exam Vitals reviewed.  Constitutional:      General: She is not in acute distress.    Appearance: She is not ill-appearing or toxic-appearing.  Cardiovascular:     Rate and Rhythm: Normal rate and regular rhythm.     Heart sounds: No murmur heard. Pulmonary:     Effort: No respiratory distress.     Breath sounds: Wheezing (audible) and rhonchi (right side) present. No rales.     Comments: Mild increased  respiratory effort Neurological:     Mental Status: She is alert.  Psychiatric:        Mood and Affect: Mood normal.        Behavior: Behavior normal.     Data Reviewed: Creatinine stable 1.03 Plts stable 122  Family Communication: none present  Disposition: Status is: Inpatient Remains inpatient appropriate because: pneumonia, SOB  Planned Discharge Destination:  LTC    Time spent: 20 minutes  Author: Murray Hodgkins, MD 12/21/2022 6:43 PM  For on call review [email or web address]  Seen October 2020pears to be chronic in nature.

## 2022-12-21 NOTE — Progress Notes (Signed)
Pt pulled out her IV. MD Sarajane Jews notified in person. Per MD, ok to leave IV out because DC tomorrow AM.

## 2022-12-21 NOTE — [place] FL2 (Signed)
[place] MEDICAID [id number] LEVEL OF CARE FORM     IDENTIFICATION  Patient Name: [name] Birthdate: May 31, 1931 Sex: female Admission Date (Current Location): 12/19/2022  [facility] and [place] Number:  Herbalist and Address:  [facility],  [place], [place]      Provider Number: [id number]  Attending Physician Name and Address:  Samuella Cota, MD  Relative Name and Phone Number:  Angele Wiemann [phone]    Current Level of Care: Hospital Recommended Level of Care: Nursing Facility Prior Approval Number:    Date Approved/Denied:   PASRR Number: [id number]  Discharge Plan: SNF (Nursing facility)    Current Diagnoses: Patient Active Problem List   Diagnosis Date Noted   Chronic obstructive pulmonary disease ([place]) 12/20/2022   RSV (respiratory syncytial virus pneumonia) 12/19/2022   Acute kidney injury superimposed on chronic kidney disease ([place]) 12/19/2022   Hypokalemia 12/19/2022   Acute respiratory failure with hypoxia ([place]) 12/19/2022   Chronic combined systolic and diastolic CHF (congestive heart failure) ([place]) 03/19/2021   Hypothyroidism 03/19/2021   Encephalopathy 10/02/2019   Elevated troponin 10/02/2019   AMS (altered mental status) 10/02/2019   Heart block    Encounter for care of pacemaker 06/13/2019   Right carotid bruit 02/18/2019   Chronic kidney disease, stage 3a ([place]) 01/29/2019   Hypoglycemia 01/29/2019   CHF (congestive heart failure) ([place]) 01/28/2019   Rt Olecranon fracture 04/07/2018   Essential hypertension 04/07/2018   Rt Hip Fracture ([place]) 04/06/2018   Obstructive lung disease (generalized) ([place]) 10/04/2017   Humerus fracture 06/09/2016   Hx of right BKA ([place]) 06/09/2016   Acute hypoxic respiratory failure ([place]) 06/09/2016   Malnutrition of moderate degree 06/09/2016   Unilateral AKA ([place]) 09/23/2014   Chronic atrial fibrillation ([place]) 08/12/2014   Pulmonary hypertension, unspecified ([place])  08/12/2014   Hypertension 01/17/2014   Cardiac pacemaker in situ 01/17/2014   Heart block AV complete ([place]) 07/03/2013   Atrial fibrillation ([place]) 07/03/2013   Breast cancer, left breast ([place]) 04/24/2013   Other symptoms involving cardiovascular system 12/22/2010   MITRAL REGURGITATION 06/17/2010   HOCM (hypertrophic obstructive cardiomyopathy) ([place]) 06/17/2010   History of heart block 06/17/2010   HEMORRHOIDS 06/17/2010   BRONCHITIS 06/17/2010   Asthma 06/17/2010   CYSTITIS 06/17/2010   URINARY INCONTINENCE 06/17/2010   Pacemaker Medtronic Adapta dual-chamber pacemaker 2006, generator change 11/27/2013, programmed to VVI  Right infracla. Fossa 06/21/2005    Orientation RESPIRATION BLADDER Height & Weight     Self  Normal Incontinent, External catheter Weight:   Height:     BEHAVIORAL SYMPTOMS/MOOD NEUROLOGICAL BOWEL NUTRITION STATUS      Continent Diet (Regular)  AMBULATORY STATUS COMMUNICATION OF NEEDS Skin   Limited Assist Verbally Normal                       Personal Care Assistance Level of Assistance  Bathing, Feeding, Dressing Bathing Assistance: Limited assistance Feeding assistance: Independent Dressing Assistance: Limited assistance     Functional Limitations Info  Sight, Hearing, Speech Sight Info: Adequate Hearing Info: Adequate Speech Info: Adequate    SPECIAL CARE FACTORS FREQUENCY                       Contractures Contractures Info: Not present    Additional Factors Info  Code Status, Allergies Code Status Info: DNR Allergies Info: Codeine, Morphine And Related, Sulfa Antibiotics, Ceftaroline  Current Medications (12/21/2022):  This is the current hospital active medication list Current Facility-Administered Medications  Medication Dose Route Frequency Provider Last Rate Last Admin   albuterol (PROVENTIL) (2.5 MG/3ML) 0.083% nebulizer solution 2.5 mg  2.5 mg Nebulization Q4H PRN Tu, Ching T, DO       allopurinol  (ZYLOPRIM) tablet 100 mg  100 mg Oral Daily Tu, Ching T, DO   100 mg at 12/21/22 0836   apixaban (ELIQUIS) tablet 2.5 mg  2.5 mg Oral BID Tu, Ching T, DO   2.5 mg at 12/21/22 0836   azithromycin (ZITHROMAX) tablet 500 mg  500 mg Oral QHS Gwynne Edinger, MD   500 mg at 12/20/22 2132   cefTRIAXone (ROCEPHIN) 1 g in sodium chloride 0.9 % 100 mL IVPB  1 g Intravenous Q24H Gwynne Edinger, MD 200 mL/hr at 12/20/22 2136 1 g at 12/20/22 2136   Chlorhexidine Gluconate Cloth 2 % PADS 6 each  6 each Topical Q0600 Tu, Ching T, DO   6 each at 12/21/22 0450   cyanocobalamin (VITAMIN B12) tablet 1,000 mcg  1,000 mcg Oral Daily Tu, Ching T, DO   1,000 mcg at 12/21/22 0836   escitalopram (LEXAPRO) tablet 10 mg  10 mg Oral Daily Tu, Ching T, DO   10 mg at 12/21/22 0836   gabapentin (NEURONTIN) capsule 200 mg  200 mg Oral BID Tu, Ching T, DO   200 mg at 12/21/22 0836   ipratropium-albuterol (DUONEB) 0.5-2.5 (3) MG/3ML nebulizer solution 3 mL  3 mL Nebulization TID Tu, Ching T, DO   3 mL at 12/21/22 0841   lactulose (CHRONULAC) 10 GM/15ML solution 20 g  20 g Oral Daily Gwynne Edinger, MD   20 g at 12/21/22 0836   latanoprost (XALATAN) 0.005 % ophthalmic solution 1 drop  1 drop Both Eyes QHS Gwynne Edinger, MD   1 drop at 12/20/22 2134   levothyroxine (SYNTHROID) tablet 25 mcg  25 mcg Oral QAC breakfast Tu, Ching T, DO   25 mcg at 12/21/22 0451   memantine (NAMENDA) tablet 10 mg  10 mg Oral BID Tu, Ching T, DO   10 mg at 12/21/22 0836   mupirocin ointment (BACTROBAN) 2 % 1 Application  1 Application Nasal BID Tu, Ching T, DO   1 Application at 56/21/30 0836   potassium chloride SA (KLOR-CON M) CR tablet 40 mEq  40 mEq Oral Once Regan Lemming, MD       predniSONE (DELTASONE) tablet 40 mg  40 mg Oral Q breakfast Gwynne Edinger, MD   40 mg at 12/21/22 8657   senna-docusate (Senokot-S) tablet 1 tablet  1 tablet Oral BID Tu, Ching T, DO   1 tablet at 12/21/22 8469     Discharge Medications: Please  see discharge summary for a list of discharge medications.  Relevant Imaging Results:  Relevant Lab Results:   Additional Information [id number]  Vassie Moselle, LCSW

## 2022-12-21 NOTE — Hospital Course (Addendum)
89 year old woman presented from [place] home for shortness of breath, found to be hypoxic.  Admitted for RSV acute respiratory illness and Streptococcus pneumoniae pneumonia.

## 2022-12-22 DIAGNOSIS — J121 Respiratory syncytial virus pneumonia: Secondary | ICD-10-CM | POA: Diagnosis not present

## 2022-12-22 DIAGNOSIS — J9601 Acute respiratory failure with hypoxia: Secondary | ICD-10-CM | POA: Diagnosis not present

## 2022-12-22 DIAGNOSIS — J13 Pneumonia due to Streptococcus pneumoniae: Secondary | ICD-10-CM | POA: Diagnosis not present

## 2022-12-22 LAB — CBC
HCT: 38.5 % (ref 36.0–46.0)
Hemoglobin: 12.4 g/dL (ref 12.0–15.0)
MCH: 34.1 pg — ABNORMAL HIGH (ref 26.0–34.0)
MCHC: 32.2 g/dL (ref 30.0–36.0)
MCV: 105.8 fL — ABNORMAL HIGH (ref 80.0–100.0)
Platelets: 118 10*3/uL — ABNORMAL LOW (ref 150–400)
RBC: 3.64 MIL/uL — ABNORMAL LOW (ref 3.87–5.11)
RDW: 14.6 % (ref 11.5–15.5)
WBC: 10.3 10*3/uL (ref 4.0–10.5)
nRBC: 0 % (ref 0.0–0.2)

## 2022-12-22 LAB — BASIC METABOLIC PANEL
Anion gap: 6 (ref 5–15)
BUN: 40 mg/dL — ABNORMAL HIGH (ref 8–23)
CO2: 25 mmol/L (ref 22–32)
Calcium: 8.9 mg/dL (ref 8.9–10.3)
Chloride: 107 mmol/L (ref 98–111)
Creatinine, Ser: 1.1 mg/dL — ABNORMAL HIGH (ref 0.44–1.00)
GFR, Estimated: 48 mL/min — ABNORMAL LOW (ref >60)
Glucose, Bld: 95 mg/dL (ref 70–99)
Potassium: 4.8 mmol/L (ref 3.5–5.1)
Sodium: 138 mmol/L (ref 135–145)

## 2022-12-22 MED ORDER — AMOXICILLIN 500 MG PO CAPS: 500.0000 mg | ORAL_CAPSULE | Freq: Two times a day (BID) | ORAL | 0 refills | Status: AC

## 2022-12-22 NOTE — Progress Notes (Signed)
Called report to [facility] in [place]. Dana aware Corey Harold has been called for transport.

## 2022-12-22 NOTE — Discharge Summary (Addendum)
Physician Discharge Summary   Patient: [name] MRN: [id number] DOB: 1931/01/18  Admit date:     12/19/2022  Discharge date: 12/22/22  Discharge Physician: Murray Hodgkins   PCP: Donnajean Lopes, MD   Recommendations at discharge:   Resolution of RSV respiratory illness, Strep pneumoniae pneumonia, COPD exacerbation.   Chronic diastolic CHF.  Last echo showed moderate to severe mitral valve regurgitation, severe tricuspid valve regurgitation. Follow-up with cardiology if indicated.  Defer to PCP.   Thrombocytopenia Secondary to infection. Plts appear stable 118, check again in 3-5 days.   Discharge Diagnoses: Principal Problem:   Acute respiratory failure with hypoxia [facility] 2) Active Problems:   History of heart block   Asthma   Pacemaker Medtronic Adapta dual-chamber pacemaker 2006, generator change 11/27/2013, programmed to VVI  Right infracla. Fossa   Atrial fibrillation (HCC)   Hx of right BKA (HCC)   Essential hypertension   Chronic kidney disease, stage 3a (HCC)   Chronic diastolic CHF (congestive heart failure) (HCC)   Hypothyroidism   RSV (respiratory syncytial virus pneumonia)    Hypokalemia Strep pneumoniae pneumonia COPD exacerbation. Chronic diastolic CHF.  Elevated troponin Thrombocytopenia  Resolved Problems:   * No resolved hospital problems. *  Hospital Course: 89 year old woman presented from [facility] for shortness of breath, found to be hypoxic.  Admitted for RSV acute respiratory illness and Streptococcus pneumoniae pneumonia. Condition gradually improved, oxygen was weaned off patient was transitioned to oral antibiotics and discharged home to long-term care in good condition.  Acute hypoxic respiratory failure secondary to RSV respiratory illness, Strep pneumoniae pneumonia, COPD exacerbation. CTA chest no PE, showed multifocal pneumonia, COPD. Acute hypoxia resolved. Continue supportive care for RSV.  Antibiotics for Streptococcus pneumoniae,  Amoxicillin for 5 days. Continue prednisone, will taper patient, bronchodilators for COPD.   Hypokalemia Resolved.   Chronic diastolic CHF.  Last echo showed moderate to severe mitral valve regurgitation, severe tricuspid valve regurgitation. Appears stable.  Resume spironolactone and furosemide on discharge.  Follow-up with cardiology indicated.  Defer to PCP.   Elevated troponin Appears to be chronic, seen 09/2019.  EKG nonacute (paced).  No further recommendations.   Thrombocytopenia Secondary to infection. Plts appear stable 118, check again in 3-5 days.   Dementia Stable. Continue memantine   Hypothyroidism Continue levothyroxine   A-fib, PPM - cont home apixaban   # Chronic pain - home gabapentin   # MDD - home lexapro   Right BKA Noted  Acute kidney injury superimposed on chronic kidney disease ([place]) was documented in H&P, but AKI has been ruled out.  CKD stage IIIa stable  Consultants:  None  Procedures performed:  None   Disposition: Long term care facility Diet recommendation:  Cardiac diet DISCHARGE MEDICATION: Allergies as of 12/22/2022       Reactions   Codeine Nausea And Vomiting   Morphine And Related Nausea And Vomiting   Sulfa Antibiotics Nausea And Vomiting   Ceftaroline Rash        Medication List     TAKE these medications    albuterol (2.5 MG/3ML) 0.083% nebulizer solution Commonly known as: PROVENTIL Take 2.5 mg by nebulization every 4 (four) hours as needed for wheezing or shortness of breath.   allopurinol 100 MG tablet Commonly known as: ZYLOPRIM Take 100 mg by mouth daily.   amoxicillin 500 MG capsule Commonly known as: AMOXIL Take 1 capsule (500 mg total) by mouth 2 (two) times daily for 5 days.   apixaban 2.5 MG Tabs tablet  Commonly known as: Eliquis Take 1 tablet (2.5 mg total) by mouth 2 (two) times daily.   carbamide peroxide 6.5 % OTIC solution Commonly known as: DEBROX 3 drops every Monday.    cyanocobalamin 1000 MCG tablet Take 1,000 mcg by mouth daily.   escitalopram 10 MG tablet Commonly known as: LEXAPRO Take 10 mg by mouth daily.   furosemide 40 MG tablet Commonly known as: LASIX Take 40 mg by mouth in the morning.   gabapentin 100 MG capsule Commonly known as: NEURONTIN Take 200 mg by mouth 2 (two) times daily.   lactulose 10 GM/15ML solution Commonly known as: CHRONULAC Take 20 g by mouth daily.   latanoprost 0.005 % ophthalmic solution Commonly known as: XALATAN Place 1 drop into both eyes at bedtime.   levothyroxine 25 MCG tablet Commonly known as: SYNTHROID Take 25 mcg by mouth daily before breakfast.   memantine 10 MG tablet Commonly known as: NAMENDA Take 10 mg by mouth 2 (two) times daily.   polyethylene glycol powder 17 GM/SCOOP powder Commonly known as: GLYCOLAX/MIRALAX Take 17 g by mouth See admin instructions. Mix 17 grams of powder into 8 ounces of fluid and drink once a day   predniSONE 10 MG tablet Commonly known as: DELTASONE Take 10-40 mg by mouth daily with breakfast. Take 40 mg by mouth once a day FOR COPD for 2 days, then 20 mg once a day for 2 days, then 10 mg once a day for 3 days, then d/c   senna-docusate 8.6-50 MG tablet Commonly known as: Senokot-S Take 1 tablet by mouth 2 (two) times daily.   spironolactone 25 MG tablet Commonly known as: ALDACTONE Take 25 mg by mouth daily.        Follow-up Information     Donnajean Lopes, MD. Schedule an appointment as soon as possible for a visit in 1 week(s).   Specialty: Internal Medicine Contact information: [address] [place] [address] [phone]                Feels ok, has a sore throat Discharge Exam: Filed Weights   12/22/22 1000  Weight: 49 kg   Physical Exam Vitals reviewed.  Constitutional:      General: She is not in acute distress.    Appearance: She is not ill-appearing or toxic-appearing.  Cardiovascular:     Rate and Rhythm:  Normal rate and regular rhythm.     Heart sounds: No murmur heard. Pulmonary:     Effort: Pulmonary effort is normal. No respiratory distress.     Breath sounds: Rhonchi present. No wheezing or rales.  Neurological:     Mental Status: She is alert.  Psychiatric:        Mood and Affect: Mood normal.        Behavior: Behavior normal.      Condition at discharge: good  The results of significant diagnostics from this hospitalization (including imaging, microbiology, ancillary and laboratory) are listed below for reference.   Imaging Studies: CT Angio Chest PE W and/or Wo Contrast  Result Date: 12/19/2022 CLINICAL DATA:  Shortness of breath, hypoxia, high clinical suspicion for pulmonary embolism EXAM: CT ANGIOGRAPHY CHEST WITH CONTRAST TECHNIQUE: Multidetector CT imaging of the chest was performed using the standard protocol during bolus administration of intravenous contrast. Multiplanar CT image reconstructions and MIPs were obtained to evaluate the vascular anatomy. RADIATION DOSE REDUCTION: This exam was performed according to the departmental dose-optimization program which includes automated exposure control, adjustment of the mA  and/or kV according to patient size and/or use of iterative reconstruction technique. CONTRAST:  20m OMNIPAQUE IOHEXOL 350 MG/ML SOLN COMPARISON:  Previous CT done on 03/19/2021, chest radiograph done earlier today FINDINGS: Cardiovascular: There are no intraluminal filling defects in pulmonary artery branches. There is ectasia of main pulmonary artery measuring 4.3 cm. Contrast density in thoracic aorta is less than adequate to evaluate the lumen. There is reflux of contrast into hepatic veins suggesting right heart dysfunction. Scattered calcifications are seen in thoracic aorta and coronary arteries. Dense calcification is seen in mitral annulus. Pacemaker battery is seen in right infraclavicular region. Mediastinum/Nodes: No significant lymphadenopathy seen.  Lungs/Pleura: Increase in AP diameter of chest suggests COPD. There is interval clearing of patchy ground-glass densities in both lungs. New patchy alveolar infiltrate is seen in right upper lobe, right lower lobe, right middle lobe, left upper lobe and left lower lobe. There is no pleural effusion or pneumothorax. Upper Abdomen: No acute findings are seen. Scattered diverticula are seen in visualized portions of splenic flexure. Musculoskeletal: There is surgical fusion at C6-C7 level in cervical spine. Review of the MIP images confirms the above findings. IMPRESSION: There is no evidence of pulmonary artery embolism. There is ectasia of main pulmonary artery suggesting pulmonary arterial hypertension. COPD. There are small patchy infiltrates in both lungs, more so in the right lung suggesting multifocal pneumonia. Aortic atherosclerosis. Coronary artery calcifications are seen. There is dense calcification in mitral annulus. Electronically Signed   By: PElmer PickerM.D.   On: 12/19/2022 19:56   DG Chest Port 1 View  Result Date: 12/19/2022 CLINICAL DATA:  Shortness of breath EXAM: PORTABLE CHEST 1 VIEW COMPARISON:  Radiograph March 19, 2021. FINDINGS: Patient is rotated. Right chest cardiac pacemaker with leads projecting over the right atrium and right ventricle. Prior median sternotomy and CABG. Similar cardiac enlargement. Calcifications of the mitral annulus. Aortic atherosclerosis with unfolding of the aorta. Right hemithorax is obscured by mediastinal structures secondary to patient rotation. No focal airspace consolidation within the visualized portions of the right lung. Left lung is clear. No visible pleural effusion or pneumothorax. No acute osseous abnormality. Surgical clips overlie the inferior chest. Cervical fusion hardware. IMPRESSION: Right hemithorax is obscured by mediastinal structures secondary to patient rotation. No focal airspace consolidation within the visualized portions of the  lung. Electronically Signed   By: JDahlia BailiffM.D.   On: 12/19/2022 16:37    Microbiology: Results for orders placed or performed during the hospital encounter of 12/19/22  Resp panel by RT-PCR (RSV, Flu A&B, Covid) Anterior Nasal Swab     Status: Abnormal   Collection Time: 12/19/22  4:05 PM   Specimen: Anterior Nasal Swab  Result Value Ref Range Status   SARS Coronavirus 2 by RT PCR NEGATIVE NEGATIVE Final    Comment: (NOTE) SARS-CoV-2 target nucleic acids are NOT DETECTED.  The SARS-CoV-2 RNA is generally detectable in upper respiratory specimens during the acute phase of infection. The lowest concentration of SARS-CoV-2 viral copies this assay can detect is 138 copies/mL. A negative result does not preclude SARS-Cov-2 infection and should not be used as the sole basis for treatment or other patient management decisions. A negative result may occur with  improper specimen collection/handling, submission of specimen other than nasopharyngeal swab, presence of viral mutation(s) within the areas targeted by this assay, and inadequate number of viral copies(<138 copies/mL). A negative result must be combined with clinical observations, patient history, and epidemiological information. The expected result is Negative.  Fact Sheet for Patients:  EntrepreneurPulse.com.au  Fact Sheet for Healthcare Providers:  IncredibleEmployment.be  This test is no t yet approved or cleared by the [place] FDA and  has been authorized for detection and/or diagnosis of SARS-CoV-2 by FDA under an Emergency Use Authorization (EUA). This EUA will remain  in effect (meaning this test can be used) for the duration of the COVID-19 declaration under Section 564(b)(1) of the Act, 21 U.S.C.section 360bbb-3(b)(1), unless the authorization is terminated  or revoked sooner.       Influenza A by PCR NEGATIVE NEGATIVE Final   Influenza B by PCR NEGATIVE NEGATIVE  Final    Comment: (NOTE) The Xpert Xpress SARS-CoV-2/FLU/RSV plus assay is intended as an aid in the diagnosis of influenza from Nasopharyngeal swab specimens and should not be used as a sole basis for treatment. Nasal washings and aspirates are unacceptable for Xpert Xpress SARS-CoV-2/FLU/RSV testing.  Fact Sheet for Patients: EntrepreneurPulse.com.au  Fact Sheet for Healthcare Providers: IncredibleEmployment.be  This test is not yet approved or cleared by the [place] FDA and has been authorized for detection and/or diagnosis of SARS-CoV-2 by FDA under an Emergency Use Authorization (EUA). This EUA will remain in effect (meaning this test can be used) for the duration of the COVID-19 declaration under Section 564(b)(1) of the Act, 21 U.S.C. section 360bbb-3(b)(1), unless the authorization is terminated or revoked.     Resp Syncytial Virus by PCR POSITIVE (A) NEGATIVE Final    Comment: (NOTE) Fact Sheet for Patients: EntrepreneurPulse.com.au  Fact Sheet for Healthcare Providers: IncredibleEmployment.be  This test is not yet approved or cleared by the [place] FDA and has been authorized for detection and/or diagnosis of SARS-CoV-2 by FDA under an Emergency Use Authorization (EUA). This EUA will remain in effect (meaning this test can be used) for the duration of the COVID-19 declaration under Section 564(b)(1) of the Act, 21 U.S.C. section 360bbb-3(b)(1), unless the authorization is terminated or revoked.  Performed at [facility], [place] [address], [place], [place] [address]   MRSA Next Gen by PCR, Nasal     Status: Abnormal   Collection Time: 12/20/22 10:22 PM   Specimen: Nasal Mucosa; Nasal Swab  Result Value Ref Range Status   MRSA by PCR Next Gen DETECTED (A) NOT DETECTED Final    Comment: (NOTE) The GeneXpert MRSA Assay (FDA approved for NASAL specimens only), is  one component of a comprehensive MRSA colonization surveillance program. It is not intended to diagnose MRSA infection nor to guide or monitor treatment for MRSA infections. Test performance is not FDA approved in patients less than 23 years old. Performed at [facility], [place] [address] [place], [place] [address]     Labs: CBC: Recent Labs  Lab 12/19/22 1605 12/20/22 0500 12/21/22 0604 12/22/22 0540  WBC 13.5* 10.3 14.5* 10.3  NEUTROABS 12.8*  --   --   --   HGB 13.3 12.4 12.6 12.4  HCT 41.3 38.3 39.1 38.5  MCV 106.4* 105.5* 106.3* 105.8*  PLT 138* 127* 122* 073*   Basic Metabolic Panel: Recent Labs  Lab 12/19/22 1605 12/19/22 1805 12/20/22 0500 12/21/22 0604 12/22/22 0540  NA 143  --  141 136 138  K 2.8*  --  3.4* 4.4 4.8  CL 109  --  109 104 107  CO2 20*  --  '22 23 25  '$ GLUCOSE 186*  --  139* 82 95  BUN 38*  --  38* 42* 40*  CREATININE 1.24*  --  1.02* 1.03* 1.10*  CALCIUM 8.1*  --  7.5* 8.4* 8.9  MG  --  2.2  --   --   --    Liver Function Tests: Recent Labs  Lab 12/19/22 1605  AST 28  ALT 15  ALKPHOS 59  BILITOT 1.5*  PROT 6.6  ALBUMIN 3.7   CBG: No results for input(s): "GLUCAP" in the last 168 hours.  Discharge time spent: greater than 30 minutes.  Signed: Murray Hodgkins, MD [facility] 12/22/2022

## 2022-12-22 NOTE — Evaluation (Signed)
Physical Therapy Evaluation Patient Details Name: [name] MRN: [id number] DOB: 10-03-1931 Today's Date: 12/22/2022  History of Present Illness  Patient is 89 y.o. female presented from [facility] for shortness of breath, found to be hypoxic.  Admitted for RSV acute respiratory illness and Streptococcus pneumoniae pneumonia. PMH significant for CHF, anemia, OA, asthma, COPD, CAD, dementia, Rt AKA.   Clinical Impression  [name] is 89 y.o. female admitted with above HPI and diagnosis. Patient is currently limited by functional impairments below (see PT problem list). Patient is a resident at [facility] and is unreliable historian however does report she needs assist for transfers to wheelchair and ADL's at baseline. Currently pt requires Mod assist for bed>chair transfers with lateral scoot technique. Patient will benefit from continued skilled PT interventions to address impairments and progress independence with mobility, recommending return to facility with assist from staff. Acute PT will follow and progress as able.        Recommendations for follow up therapy are one component of a multi-disciplinary discharge planning process, led by the attending physician.  Recommendations may be updated based on patient status, additional functional criteria and insurance authorization.  Follow Up Recommendations  (Return to LTC at SNF) Can patient physically be transported by private vehicle: No    Assistance Recommended at Discharge    Patient can return home with the following  A lot of help with walking and/or transfers;A lot of help with bathing/dressing/bathroom;Assistance with cooking/housework;Direct supervision/assist for medications management;Assist for transportation;Help with stairs or ramp for entrance    Equipment Recommendations None recommended by PT  Recommendations for Other Services       Functional Status Assessment Patient has had a recent decline in their  functional status and demonstrates the ability to make significant improvements in function in a reasonable and predictable amount of time.     Precautions / Restrictions Precautions Precautions: Fall Precaution Comments: Rt AKA Restrictions Weight Bearing Restrictions: No      Mobility  Bed Mobility Overal bed mobility: Needs Assistance Bed Mobility: Supine to Sit     Supine to sit: Supervision, HOB elevated     General bed mobility comments: pt taking extra time and using bed rail. able to raise trunk and scoot to EOB to place Lt foot on floor.    Transfers Overall transfer level: Needs assistance Equipment used: None Transfers: Bed to chair/wheelchair/BSC            Lateral/Scoot Transfers: Mod assist General transfer comment: Mod assist with use of bed pad to complete lateral scoots bed>chair. pt using bil UE's to power up and mod assist needed to fully clear hips/buttock to prevent sheering.    Ambulation/Gait                  Stairs            Wheelchair Mobility    Modified Rankin (Stroke Patients Only)       Balance Overall balance assessment: Needs assistance Sitting-balance support: Feet supported, Bilateral upper extremity supported Sitting balance-Leahy Scale: Fair         Standing balance comment: NT                             Pertinent Vitals/Pain Pain Assessment Pain Assessment: No/denies pain    Home Living Family/patient expects to be discharged to:: Skilled nursing facility (LTC at [facility])  Additional Comments: Pt overall unreliable historian given baseline dementia. pt from LTC at [facility]. She reports she has a wheelchair and is able to transfer to chair from bed. Unclear on if pt is able to self propel or staff assists her.    Prior Function Prior Level of Function : Needs assist             Mobility Comments: pt reports she can tranfer to wheelchair from be herself. Pt  overall unreliable historian given baseline dementia. ADLs Comments: pt reports assit provided to transfer into shower and then she can bath self. reports some assist for dressing. Pt overall unreliable historian given baseline dementia.     Hand Dominance   Dominant Hand: Left    Extremity/Trunk Assessment   Upper Extremity Assessment Upper Extremity Assessment: Overall WFL for tasks assessed    Lower Extremity Assessment Lower Extremity Assessment: Generalized weakness;RLE deficits/detail RLE Deficits / Details: hx of Rt AKA    Cervical / Trunk Assessment Cervical / Trunk Assessment: Kyphotic  Communication   Communication: No difficulties  Cognition Arousal/Alertness: Awake/alert Behavior During Therapy: WFL for tasks assessed/performed Overall Cognitive Status: History of cognitive impairments - at baseline                                          General Comments      Exercises     Assessment/Plan    PT Assessment Patient needs continued PT services  PT Problem List Decreased strength;Decreased activity tolerance;Decreased balance;Decreased mobility;Decreased cognition;Decreased knowledge of use of DME;Decreased safety awareness;Decreased knowledge of precautions       PT Treatment Interventions DME instruction;Functional mobility training;Therapeutic activities;Therapeutic exercise;Balance training;Neuromuscular re-education;Cognitive remediation;Patient/family education    PT Goals (Current goals can be found in the Care Plan section)  Acute Rehab PT Goals Patient Stated Goal: get back home PT Goal Formulation: With patient Time For Goal Achievement: 01/05/23 Potential to Achieve Goals: Good    Frequency Min 2X/week     Co-evaluation               AM-PAC PT "6 Clicks" Mobility  Outcome Measure Help needed turning from your back to your side while in a flat bed without using bedrails?: A Little Help needed moving from lying on  your back to sitting on the side of a flat bed without using bedrails?: A Little Help needed moving to and from a bed to a chair (including a wheelchair)?: A Lot Help needed standing up from a chair using your arms (e.g., wheelchair or bedside chair)?: Total Help needed to walk in hospital room?: Total Help needed climbing 3-5 steps with a railing? : Total 6 Click Score: 11    End of Session   Activity Tolerance: Patient tolerated treatment well Patient left: in chair;with call bell/phone within reach Nurse Communication: Mobility status PT Visit Diagnosis: Other abnormalities of gait and mobility (R26.89);Muscle weakness (generalized) (M62.81)    Time: 4270-6237 PT Time Calculation (min) (ACUTE ONLY): 16 min   Charges:   PT Evaluation $PT Eval Moderate Complexity: 1 Mod          Verner Mould, DPT Acute Rehabilitation Services Office [phone]  12/22/22 2:36 PM

## 2022-12-22 NOTE — TOC Transition Note (Signed)
Transition of Care [facility]) - CM/SW Discharge Note   Patient Details  Name: [name] MRN: [id number] Date of Birth: 1931/03/11  [facility]) CM/SW Contact:  Vassie Moselle, LCSW Phone Number: 12/22/2022, 11:26 AM   Clinical Narrative:    Pt is to return to [facility] in [place]. Pt will be going to room 603A. RN to call report to [phone]. Spoke with pt's niece and confirmed discharge plans. Pt will be transported to facility via [facility].    Final next level of care: Long Term Nursing Home Barriers to Discharge: No Barriers Identified   Patient Goals and CMS Choice CMS Medicare.gov Compare Post Acute Care list provided to:: Patient Represenative (must comment) (Niece) Choice offered to / list presented to :  (Niece)  Discharge Placement                Patient chooses bed at: [place], [place] Patient to be transferred to facility by: [facility] Name of family member notified: Niece, Amanada Philbrick Patient and family notified of of transfer: 12/22/22  Discharge Plan and Services Additional resources added to the After Visit Summary for   In-house Referral: NA Discharge Planning Services: NA Post Acute Care Choice: Nursing Home, Resumption of Svcs/PTA Provider          DME Arranged: N/A DME Agency: NA                  Social Determinants of Health (SDOH) Interventions SDOH Screenings   Depression (PHQ2-9): Low Risk  (03/04/2019)  Tobacco Use: Low Risk  (12/19/2022)     Readmission Risk Interventions    12/22/2022   11:25 AM 12/21/2022    9:17 AM  Readmission Risk Prevention Plan  Transportation Screening Complete Complete  PCP or Specialist Appt within 5-7 Days Complete   PCP or Specialist Appt within 3-5 Days  Complete  Home Care Screening Complete   Medication Review (RN CM) Complete   HRI or Home Care Consult  Complete  Social Work Consult for [place] Planning/Counseling  Complete  Palliative Care Screening  Not  Applicable  Medication Review Press photographer)  Complete

## 2022-12-22 NOTE — Care Management Important Message (Signed)
Important Message  Patient Details IM Letter given. Name: [name] MRN: [id number] Date of Birth: Dec 22, 1930   Medicare Important Message Given:  Yes     Kerin Salen 12/22/2022, 2:38 PM

## 2023-02-17 DEATH — deceased

## 2023-09-18 ENCOUNTER — Encounter: Payer: Self-pay | Admitting: Cardiology

## 2023-11-15 ENCOUNTER — Ambulatory Visit: Payer: Self-pay | Admitting: Cardiology
